# Patient Record
Sex: Male | Born: 1935
Health system: Southern US, Community
[De-identification: ages and names within clinical notes are randomized; demographics above are authoritative.]

## PROBLEM LIST (undated history)

## (undated) DIAGNOSIS — M1909 Primary osteoarthritis, other specified site: Secondary | ICD-10-CM

## (undated) DIAGNOSIS — H903 Sensorineural hearing loss, bilateral: Secondary | ICD-10-CM

## (undated) DIAGNOSIS — R059 Cough, unspecified: Secondary | ICD-10-CM

## (undated) DIAGNOSIS — R05 Cough: Secondary | ICD-10-CM

## (undated) DIAGNOSIS — N400 Enlarged prostate without lower urinary tract symptoms: Secondary | ICD-10-CM

## (undated) DIAGNOSIS — I6789 Other cerebrovascular disease: Secondary | ICD-10-CM

## (undated) DIAGNOSIS — N1832 Chronic kidney disease, stage 3b: Secondary | ICD-10-CM

## (undated) DIAGNOSIS — N529 Male erectile dysfunction, unspecified: Secondary | ICD-10-CM

## (undated) DIAGNOSIS — G8929 Other chronic pain: Secondary | ICD-10-CM

## (undated) DIAGNOSIS — R7309 Other abnormal glucose: Secondary | ICD-10-CM

## (undated) DIAGNOSIS — IMO0002 Reserved for concepts with insufficient information to code with codable children: Secondary | ICD-10-CM

## (undated) DIAGNOSIS — Z8719 Personal history of other diseases of the digestive system: Secondary | ICD-10-CM

## (undated) DIAGNOSIS — R053 Chronic cough: Secondary | ICD-10-CM

## (undated) DIAGNOSIS — E11649 Type 2 diabetes mellitus with hypoglycemia without coma: Secondary | ICD-10-CM

## (undated) DIAGNOSIS — H9190 Unspecified hearing loss, unspecified ear: Secondary | ICD-10-CM

## (undated) DIAGNOSIS — E1169 Type 2 diabetes mellitus with other specified complication: Secondary | ICD-10-CM

## (undated) DIAGNOSIS — M5136 Other intervertebral disc degeneration, lumbar region: Secondary | ICD-10-CM

## (undated) DIAGNOSIS — M1990 Unspecified osteoarthritis, unspecified site: Secondary | ICD-10-CM

## (undated) DIAGNOSIS — M51369 Other intervertebral disc degeneration, lumbar region without mention of lumbar back pain or lower extremity pain: Secondary | ICD-10-CM

## (undated) DIAGNOSIS — H409 Unspecified glaucoma: Secondary | ICD-10-CM

## (undated) DIAGNOSIS — Z8673 Personal history of transient ischemic attack (TIA), and cerebral infarction without residual deficits: Secondary | ICD-10-CM

## (undated) DIAGNOSIS — K269 Duodenal ulcer, unspecified as acute or chronic, without hemorrhage or perforation: Secondary | ICD-10-CM

## (undated) DIAGNOSIS — E1121 Type 2 diabetes mellitus with diabetic nephropathy: Secondary | ICD-10-CM

## (undated) DIAGNOSIS — M7122 Synovial cyst of popliteal space [Baker], left knee: Secondary | ICD-10-CM

## (undated) DIAGNOSIS — D3501 Benign neoplasm of right adrenal gland: Secondary | ICD-10-CM

## (undated) DIAGNOSIS — Z974 Presence of external hearing-aid: Secondary | ICD-10-CM

## (undated) DIAGNOSIS — E785 Hyperlipidemia, unspecified: Secondary | ICD-10-CM

## (undated) DIAGNOSIS — I700 Atherosclerosis of aorta: Secondary | ICD-10-CM

## (undated) DIAGNOSIS — M7120 Synovial cyst of popliteal space [Baker], unspecified knee: Secondary | ICD-10-CM

## (undated) DIAGNOSIS — I10 Essential (primary) hypertension: Secondary | ICD-10-CM

## (undated) DIAGNOSIS — M5134 Other intervertebral disc degeneration, thoracic region: Secondary | ICD-10-CM

## (undated) DIAGNOSIS — I5020 Unspecified systolic (congestive) heart failure: Secondary | ICD-10-CM

## (undated) DIAGNOSIS — M5030 Other cervical disc degeneration, unspecified cervical region: Secondary | ICD-10-CM

## (undated) DIAGNOSIS — J3800 Paralysis of vocal cords and larynx, unspecified: Secondary | ICD-10-CM

## (undated) DIAGNOSIS — I639 Cerebral infarction, unspecified: Secondary | ICD-10-CM

## (undated) HISTORY — DX: Reserved for concepts with insufficient information to code with codable children: IMO0002

## (undated) HISTORY — DX: Other abnormal glucose: R73.09

## (undated) HISTORY — DX: Unspecified glaucoma: H40.9

## (undated) HISTORY — DX: Type 2 diabetes mellitus with other specified complication: E78.5

## (undated) HISTORY — DX: Unspecified osteoarthritis, unspecified site: M19.90

## (undated) HISTORY — PX: HERNIA REPAIR: SHX51

## (undated) HISTORY — DX: Benign prostatic hyperplasia without lower urinary tract symptoms: N40.0

## (undated) HISTORY — DX: Synovial cyst of popliteal space (Baker), left knee: M71.22

## (undated) HISTORY — PX: COCHLEAR IMPLANT: SUR684

## (undated) HISTORY — PX: CARDIAC CATHETERIZATION: SHX172

## (undated) HISTORY — DX: Cerebral infarction, unspecified: I63.9

## (undated) HISTORY — PX: OTHER SURGICAL HISTORY: SHX169

## (undated) HISTORY — DX: Paralysis of vocal cords and larynx, unspecified: J38.00

## (undated) HISTORY — PX: TONSILLECTOMY: SUR1361

## (undated) HISTORY — PX: ADRENALECTOMY: SHX876

## (undated) HISTORY — DX: Personal history of other diseases of the digestive system: Z87.19

## (undated) HISTORY — DX: Male erectile dysfunction, unspecified: N52.9

## (undated) HISTORY — DX: Personal history of transient ischemic attack (TIA), and cerebral infarction without residual deficits: Z86.73

---

## 2005-09-23 ENCOUNTER — Encounter: Payer: Self-pay | Admitting: Family Medicine

## 2006-03-07 HISTORY — PX: SQUAMOUS CELL CARCINOMA EXCISION: SHX2433

## 2006-11-03 ENCOUNTER — Encounter: Payer: Self-pay | Admitting: Family Medicine

## 2006-11-04 LAB — CONVERTED CEMR LAB
HDL: 78 mg/dL
Hgb A1c MFr Bld: 6.2 %
LDL Cholesterol: 109 mg/dL
Triglycerides: 49 mg/dL

## 2007-07-20 ENCOUNTER — Ambulatory Visit: Payer: Self-pay | Admitting: Family Medicine

## 2007-09-07 ENCOUNTER — Ambulatory Visit: Payer: Self-pay | Admitting: Family Medicine

## 2007-09-07 DIAGNOSIS — N400 Enlarged prostate without lower urinary tract symptoms: Secondary | ICD-10-CM | POA: Insufficient documentation

## 2007-09-07 DIAGNOSIS — Z8719 Personal history of other diseases of the digestive system: Secondary | ICD-10-CM

## 2007-09-07 DIAGNOSIS — H409 Unspecified glaucoma: Secondary | ICD-10-CM | POA: Insufficient documentation

## 2007-09-07 DIAGNOSIS — M1990 Unspecified osteoarthritis, unspecified site: Secondary | ICD-10-CM | POA: Insufficient documentation

## 2007-09-07 DIAGNOSIS — Z86018 Personal history of other benign neoplasm: Secondary | ICD-10-CM | POA: Insufficient documentation

## 2007-10-09 ENCOUNTER — Ambulatory Visit: Payer: Self-pay | Admitting: Gastroenterology

## 2007-10-23 ENCOUNTER — Ambulatory Visit: Payer: Self-pay | Admitting: Gastroenterology

## 2007-11-24 ENCOUNTER — Ambulatory Visit: Payer: Self-pay | Admitting: Family Medicine

## 2007-11-24 LAB — CONVERTED CEMR LAB
AST: 20 units/L (ref 0–37)
Alkaline Phosphatase: 45 units/L (ref 39–117)
Bilirubin, Direct: 0.1 mg/dL (ref 0.0–0.3)
Chloride: 105 meq/L (ref 96–112)
GFR calc Af Amer: 76 mL/min
GFR calc non Af Amer: 63 mL/min
Glucose, Bld: 100 mg/dL — ABNORMAL HIGH (ref 70–99)
HDL: 83.3 mg/dL (ref >39.0)
Potassium: 4.4 meq/L (ref 3.5–5.1)
Sodium: 141 meq/L (ref 135–145)
VLDL: 7 mg/dL (ref 0–40)

## 2007-11-25 ENCOUNTER — Encounter: Payer: Self-pay | Admitting: Family Medicine

## 2007-12-12 LAB — CONVERTED CEMR LAB
Catecholamines Tot(E+NE) 24 Hr U: 0.048 mg/24hr
Dopamine 24 Hr Urine: 156 mcg/24hr (ref <500)
Epinephrine 24 Hr Urine: 5 mcg/24hr (ref <20)

## 2007-12-14 ENCOUNTER — Ambulatory Visit: Payer: Self-pay | Admitting: Family Medicine

## 2007-12-15 ENCOUNTER — Ambulatory Visit: Payer: Self-pay | Admitting: Gastroenterology

## 2007-12-15 LAB — HM COLONOSCOPY

## 2008-07-18 ENCOUNTER — Encounter: Payer: Self-pay | Admitting: Family Medicine

## 2008-08-02 ENCOUNTER — Telehealth: Payer: Self-pay | Admitting: Family Medicine

## 2008-09-13 ENCOUNTER — Telehealth: Payer: Self-pay | Admitting: Family Medicine

## 2009-02-09 ENCOUNTER — Encounter: Payer: Self-pay | Admitting: Family Medicine

## 2009-02-13 ENCOUNTER — Encounter: Payer: Self-pay | Admitting: Family Medicine

## 2009-05-09 ENCOUNTER — Telehealth: Payer: Self-pay | Admitting: Family Medicine

## 2009-05-12 ENCOUNTER — Ambulatory Visit: Payer: Self-pay | Admitting: Family Medicine

## 2009-05-12 DIAGNOSIS — E1169 Type 2 diabetes mellitus with other specified complication: Secondary | ICD-10-CM | POA: Insufficient documentation

## 2009-05-12 DIAGNOSIS — E7800 Pure hypercholesterolemia, unspecified: Secondary | ICD-10-CM

## 2009-05-13 ENCOUNTER — Encounter: Payer: Self-pay | Admitting: Family Medicine

## 2009-05-13 LAB — CONVERTED CEMR LAB
Albumin: 3.8 g/dL (ref 3.5–5.2)
Alkaline Phosphatase: 42 units/L (ref 39–117)
Basophils Relative: 0 % (ref 0.0–3.0)
Bilirubin, Direct: 0.1 mg/dL (ref 0.0–0.3)
Calcium: 9 mg/dL (ref 8.4–10.5)
Eosinophils Absolute: 0.1 10*3/uL (ref 0.0–0.7)
GFR calc non Af Amer: 69.29 mL/min (ref >60)
HDL: 78.7 mg/dL (ref >39.00)
Lymphocytes Relative: 23.9 % (ref 12.0–46.0)
MCHC: 33.4 g/dL (ref 30.0–36.0)
Neutrophils Relative %: 63.2 % (ref 43.0–77.0)
RBC: 4.3 M/uL (ref 4.22–5.81)
Sodium: 139 meq/L (ref 135–145)
Total Bilirubin: 1.1 mg/dL (ref 0.3–1.2)
Total CHOL/HDL Ratio: 3
VLDL: 8 mg/dL (ref 0.0–40.0)
WBC: 4.4 10*3/uL — ABNORMAL LOW (ref 4.5–10.5)

## 2009-05-15 ENCOUNTER — Ambulatory Visit: Payer: Self-pay | Admitting: Family Medicine

## 2009-08-05 DEATH — deceased

## 2009-09-01 ENCOUNTER — Telehealth: Payer: Self-pay | Admitting: Family Medicine

## 2010-03-05 ENCOUNTER — Encounter (INDEPENDENT_AMBULATORY_CARE_PROVIDER_SITE_OTHER): Payer: Self-pay | Admitting: *Deleted

## 2010-05-15 ENCOUNTER — Telehealth: Payer: Self-pay | Admitting: Family Medicine

## 2010-06-16 ENCOUNTER — Encounter: Payer: Self-pay | Admitting: Family Medicine

## 2010-07-07 NOTE — Progress Notes (Signed)
Summary: levitra  Phone Note Refill Request Call back at Home Phone [phone] Message from:  Patient on September 01, 2009 11:48 AM  Refills Requested: Medication #1:  LEVITRA 20 MG TABS 1/2 to 1 tab as needed.. [facility] [address]  Initial call taken by: Melody Comas,  September 01, 2009 11:48 AM    Prescriptions: LEVITRA 20 MG TABS (VARDENAFIL HCL) 1/2 to 1 tab as needed.  #10 x 11   Entered and Authorized by:   Hannah Beat MD   Signed by:   Hannah Beat MD on 09/01/2009   Method used:   Electronically to        [id number]       [address]       [place], [place]  [address]       Ph: [phone]       Fax: [phone]   RxID:   [id number]

## 2010-07-07 NOTE — Progress Notes (Signed)
Summary: medication request  Phone Note Refill Request Call back at Home Phone [phone] Message from:  Patient on May 15, 2010 9:47 AM  Refills Requested: Medication #1:  Clotrimazole and Betamethasone Dipropionate 1%/0.05%   Supply Requested: 1 month   Notes: uses [facility] wax [facility] [address]   Method Requested: Electronic Initial call taken by: Benny Lennert CMA (AAMA),  May 15, 2010 9:48 AM    New/Updated Medications: CLOTRIMAZOLE-BETAMETHASONE 1-0.05 % CREA (CLOTRIMAZOLE-BETAMETHASONE) AAA two times a day as needed rash Prescriptions: CLOTRIMAZOLE-BETAMETHASONE 1-0.05 % CREA (CLOTRIMAZOLE-BETAMETHASONE) AAA two times a day as needed rash  #15 gm x 1   Entered and Authorized by:   Kerby Nora MD   Signed by:   Kerby Nora MD on 05/15/2010   Method used:   Electronically to        [id number]       [address]       [place], [place]  [address]       Ph: [phone]       Fax: [phone]   RxID:   [id number]

## 2010-07-07 NOTE — Miscellaneous (Signed)
  Clinical Lists Changes  Observations: Added new observation of FLU VAX: Historical (01/30/2010 11:29)      Immunization History:  Influenza Immunization History:    Influenza:  historical (01/30/2010)

## 2010-07-09 NOTE — Letter (Signed)
Summary: Medical Clearance for Exercise  Medical Clearance for Exercise   Imported By: Maryln Gottron 06/18/2010 15:15:27  _____________________________________________________________________  External Attachment:    Type:   Image     Comment:   External Document

## 2010-08-04 ENCOUNTER — Telehealth (INDEPENDENT_AMBULATORY_CARE_PROVIDER_SITE_OTHER): Payer: Self-pay | Admitting: *Deleted

## 2010-08-06 ENCOUNTER — Encounter (INDEPENDENT_AMBULATORY_CARE_PROVIDER_SITE_OTHER): Payer: Self-pay | Admitting: *Deleted

## 2010-08-06 ENCOUNTER — Other Ambulatory Visit (INDEPENDENT_AMBULATORY_CARE_PROVIDER_SITE_OTHER): Payer: Medicare Other

## 2010-08-06 ENCOUNTER — Other Ambulatory Visit: Payer: Self-pay | Admitting: Family Medicine

## 2010-08-06 DIAGNOSIS — E785 Hyperlipidemia, unspecified: Secondary | ICD-10-CM

## 2010-08-06 DIAGNOSIS — Z125 Encounter for screening for malignant neoplasm of prostate: Secondary | ICD-10-CM

## 2010-08-06 DIAGNOSIS — E7800 Pure hypercholesterolemia, unspecified: Secondary | ICD-10-CM

## 2010-08-06 LAB — BASIC METABOLIC PANEL
Chloride: 102 mEq/L (ref 96–112)
GFR: 72.88 mL/min (ref >60.00)
Potassium: 4.3 mEq/L (ref 3.5–5.1)
Sodium: 138 mEq/L (ref 135–145)

## 2010-08-06 LAB — PSA: PSA: 0.49 ng/mL (ref 0.10–4.00)

## 2010-08-06 LAB — LIPID PANEL
Total CHOL/HDL Ratio: 3
VLDL: 8.4 mg/dL (ref 0.0–40.0)

## 2010-08-06 LAB — HEPATIC FUNCTION PANEL
ALT: 25 U/L (ref 0–53)
AST: 24 U/L (ref 0–37)
Alkaline Phosphatase: 51 U/L (ref 39–117)
Total Bilirubin: 1.2 mg/dL (ref 0.3–1.2)

## 2010-08-06 LAB — LDL CHOLESTEROL, DIRECT: Direct LDL: 121.8 mg/dL

## 2010-08-10 ENCOUNTER — Other Ambulatory Visit: Payer: Self-pay

## 2010-08-13 NOTE — Progress Notes (Signed)
----   Converted from flag ---- ---- 08/03/2010 10:24 AM, Kerby Nora MD wrote: CMET, lipids Dx 272.0, PSA V76.44  ---- 08/03/2010 10:20 AM, Liane Comber CMA (AAMA) wrote: Lab orders please! Good Morning! This pt is scheduled for cpx labs Reagan, which labs to draw and dx codes to use? Thanks Tasha ------------------------------

## 2010-08-14 ENCOUNTER — Encounter: Payer: Self-pay | Admitting: Family Medicine

## 2010-08-14 ENCOUNTER — Encounter (INDEPENDENT_AMBULATORY_CARE_PROVIDER_SITE_OTHER): Payer: Medicare Other | Admitting: Family Medicine

## 2010-08-14 DIAGNOSIS — E119 Type 2 diabetes mellitus without complications: Secondary | ICD-10-CM | POA: Insufficient documentation

## 2010-08-14 DIAGNOSIS — E1159 Type 2 diabetes mellitus with other circulatory complications: Secondary | ICD-10-CM | POA: Insufficient documentation

## 2010-08-14 DIAGNOSIS — Z0000 Encounter for general adult medical examination without abnormal findings: Secondary | ICD-10-CM

## 2010-08-14 DIAGNOSIS — E7800 Pure hypercholesterolemia, unspecified: Secondary | ICD-10-CM

## 2010-08-14 DIAGNOSIS — N400 Enlarged prostate without lower urinary tract symptoms: Secondary | ICD-10-CM

## 2010-08-14 LAB — CONVERTED CEMR LAB: Cholesterol, target level: 200 mg/dL

## 2010-08-18 NOTE — Assessment & Plan Note (Signed)
Summary: CPX/alc   Vital Signs:  Patient profile:   77 year old male Height:      72 inches Weight:      194.50 pounds BMI:     26.47 Temp:     97.9 degrees F oral Pulse rate:   76 / minute Pulse rhythm:   regular BP sitting:   120 / 74  (left arm) Cuff size:   large  Vitals Entered By: Benny Lennert CMA Duncan Dull) (August 14, 2010 3:27 PM) CC: Lipid Management  Vision Screening:      Vision Comments: Patient wears glasses  Vision Entered By: Benny Lennert CMA Duncan Dull) (August 14, 2010 3:28 PM) 40db HL: Left  Right  Audiometry Comment: Patient has hearing aids    History of Present Illness: Chief complaint annual wellness visit  I have personally reviewed the Medicare Annual Wellness questionnaire and have noted 1.   The patient's medical and social history 2.   Their use of alcohol, tobacco or illicit drugs 3.   Their current medications and supplements 4.   The patient's functional ability including ADL's, fall risks, home safety risks and hearing or visual             impairment. 5.   Diet and physical activities 6.   Evidence for depression or mood disorders The patients weight, height, BMI and visual acuity have been recorded in the chart I have made referrals, counseling and provided education to the patient based review of the above and I have provided the pt with a written personalized care plan for preventive services.   He has the following chronic issues:   High cholesterol.. LDL at goal on fish oil.  Prediabetes: slightly worsened from last check.  Gettiing regular exercsie and eating healthy.    BPH flomax and finasteride.    Lipid Management History:      Positive NCEP/ATP III risk factors include male age 36 years old or older.  Negative NCEP/ATP III risk factors include HDL cholesterol greater than 60 and non-tobacco-user status.     Preventive Screening-Counseling & Management  Alcohol-Tobacco     Alcohol drinks/day: 1     Alcohol  Counseling: not indicated; use of alcohol is not excessive or problematic     Smoking Status: never  Caffeine-Diet-Exercise     Diet Comments: moderate     Does Patient Exercise: yes     Type of exercise: treadmill     Times/week: 3     Exercise Counseling: not indicated; exercise is adequate  Problems Prior to Update: 1)  Onychomycosis  (ICD-110.1) 2)  Other Malaise and Fatigue  (ICD-780.79) 3)  Hypercholesterolemia  (ICD-272.0) 4)  Neoplasm, Skin, Uncertain Behavior  (ICD-238.2) 5)  Special Screening Malignant Neoplasm of Prostate  (ICD-V76.44) 6)  Screening For Lipoid Disorders  (ICD-V77.91) 7)  Bursitis, Elbow  (ICD-726.33) 8)  Hematochezia  (ICD-578.1) 9)  Benign Prostatic Hypertrophy  (ICD-600.00) 10)  Glaucoma  (ICD-365.9) 11)  Duodenal Ulcer, Acute, Hemorrhage  (ICD-532.00) 12)  Pheochromocytoma  (ICD-227.0) 13)  Osteoarthritis  (ICD-715.90)  Current Medications (verified): 1)  Flomax 0.4 Mg  Cp24 (Tamsulosin Hcl) .... Take 1 Tablet By Mouth Once A Day Generic Only 2)  Finasteride 5 Mg  Tabs (Finasteride) .... Take 1 Tablet By Mouth Once A Day 3)  Lumigan 0.03 %  Soln (Bimatoprost) .... Once Daily 4)  Aspirin 81 Mg  Tabs (Aspirin) .... Take 1 Tablet By Mouth Once A Day 5)  Fish Oil  Oil (Fish Oil) .... Take 1 Capsule By Mouth Once A Day 6)  Docusate Sodium 100 Mg  Caps (Docusate Sodium) .... Take 1 Tablet By Mouth Once A Day 7)  Multivitamins   Tabs (Multiple Vitamin) .... Take 1 Tablet By Mouth Once A Day 8)  Terbinafine Hcl 250 Mg Tabs (Terbinafine Hcl) .Marland Kitchen.. 1 Tab By Mouth Daily X 3 Months 9)  Levitra 20 Mg Tabs (Vardenafil Hcl) .... 1/2 To 1 Tab As Needed.  Allergies (verified): No Known Drug Allergies  Past History:  Past medical, surgical, family and social histories (including risk factors) reviewed, and no changes noted (except as noted below).  Past Medical History: Reviewed history from 10/09/2007 and no changes required. Osteoarthritis Diabetes  mellitus, type II Benign prostatic hypertrophy Bleeding duodenal ulcer in association with his pheochromocytoma  Past Surgical History: Reviewed history from 09/07/2007 and no changes required. slipped disc in lumbar spine right adrenalectomy for pheochromocytoma 2006 Tonsillectomy Right inguinal hernia repair 10/11/05 squamous cell CA left ear 10/07  Family History: Reviewed history from 12/14/2007 and no changes required. father: brain tumor mother: alcoholic, CAD brother: DM no cancer  Social History: Reviewed history from 09/07/2007 and no changes required. Retired Orthoptist Married Never Smoked Alcohol use-yes, 2 glasses of wine daily Drug use-no Regular exercise-yes 3-4 x a week treadmill/walk Diet:  fruits, veggies, water  Review of Systems General:  Denies fatigue and fever. CV:  Denies chest pain or discomfort. Resp:  Denies shortness of breath, sputum productive, and wheezing. GI:  Denies abdominal pain and bloody stools. GU:  Denies dysuria, urinary frequency, and urinary hesitancy; ED improved with levitra.. Psych:  Denies anxiety and depression.  Physical Exam  General:  Well-developed,well-nourished,in no acute distress; alert,appropriate and cooperative throughout examination Eyes:  No corneal or conjunctival inflammation noted. EOMI. Perrla. Funduscopic exam benign, without hemorrhages, exudates or papilledema. Vision grossly normal. Ears:  External ear exam shows no significant lesions or deformities.  Otoscopic examination reveals clear canals, tympanic membranes are intact bilaterally without bulging, retraction, inflammation or discharge. Hearing is grossly normal bilaterally. Nose:  External nasal examination shows no deformity or inflammation. Nasal mucosa are pink and moist without lesions or exudates. Mouth:  Oral mucosa and oropharynx without lesions or exudates.  Teeth in good repair. Neck:  no carotid bruit or thyromegaly  no cervical or  supraclavicular lymphadenopathy  Lungs:  Normal respiratory effort, chest expands symmetrically. Lungs are clear to auscultation, no crackles or wheezes. Heart:  Normal rate and regular rhythm. S1 and S2 normal without gallop, murmur, click, rub or other extra sounds. Abdomen:  Bowel sounds positive,abdomen soft and non-tender without masses, organomegaly or hernias noted. Rectal:  No external abnormalities noted. Normal sphincter tone. No rectal masses or tenderness. Genitalia:  Testes bilaterally descended without nodularity, tenderness or masses. No scrotal masses or lesions. No penis lesions or urethral discharge. Prostate:  Prostate gland firm and smooth, mild  enlargement, nodularity, tenderness, mass, asymmetry or induration. Msk:  No deformity or scoliosis noted of thoracic or lumbar spine.   Pulses:  R and L posterior tibial pulses are full and equal bilaterally  Extremities:  no edema Neurologic:  No cranial nerve deficits noted. Station and gait are normal. Plantar reflexes are down-going bilaterally. DTRs are symmetrical throughout. Sensory, motor and coordinative functions appear intact. Skin:  Intact without suspicious lesions or rashes surgical site onleft lateral cheek Psych:  Cognition and judgment appear intact. Alert and cooperative with normal attention span and concentration. No apparent delusions, illusions,  hallucinations   Impression & Recommendations:  Problem # 1:  Preventive Health Care (ICD-V70.0)  The patient's preventative maintenance and recommended screening tests for an annual wellness exam were reviewed in full today. Brought up to date unless services declined.  Counselled on the importance of diet, exercise, and its role in overall health and mortality. The patient's FH and SH was reviewed, including their home life, tobacco status, and drug and alcohol status.     Orders: Medicare -1st Annual Wellness Visit [phone])  Problem # 2:  BENIGN PROSTATIC  HYPERTROPHY (ICD-600.00) stable symptoms.  Orders: Prescription Created Electronically [id number])  Problem # 3:  HYPERCHOLESTEROLEMIA (ICD-272.0) Stable control. Encouraged exercise, weight loss, healthy eating habits.   Problem # 4:  PREDIABETES (ICD-790.29) Counsled on low carb diet.Marland Kitchen decrease fruit, OJ, etc.  Complete Medication List: 1)  Flomax 0.4 Mg Cp24 (Tamsulosin hcl) .... Take 1 tablet by mouth once a day generic only 2)  Finasteride 5 Mg Tabs (Finasteride) .... Take 1 tablet by mouth once a day 3)  Lumigan 0.03 % Soln (Bimatoprost) .... Once daily 4)  Aspirin 81 Mg Tabs (Aspirin) .... Take 1 tablet by mouth once a day 5)  Fish Oil Oil (Fish oil) .... Take 1 capsule by mouth once a day 6)  Docusate Sodium 100 Mg Caps (Docusate sodium) .... Take 1 tablet by mouth once a day 7)  Multivitamins Tabs (Multiple vitamin) .... Take 1 tablet by mouth once a day 8)  Terbinafine Hcl 250 Mg Tabs (Terbinafine hcl) .Marland Kitchen.. 1 tab by mouth daily x 3 months 9)  Levitra 20 Mg Tabs (Vardenafil hcl) .... 1/2 to 1 tab as needed.  Lipid Assessment/Plan:      Based on NCEP/ATP III, the patient's risk factor category is "0-1 risk factors".  The patient's lipid goals are as follows: Total cholesterol goal is 200; LDL cholesterol goal is 160; HDL cholesterol goal is 40; Triglyceride goal is 150.  His LDL cholesterol goal has been met.    Patient Instructions: 1)  We have reviewed your personalized plan for preventive services.  2)  Decrease OJ in diet. 3)  Please schedule a follow-up appointment in 1 year for CPX.  Prescriptions: FLOMAX 0.4 MG  CP24 (TAMSULOSIN HCL) Take 1 tablet by mouth once a day GENERIC ONLY  #90 x 3   Entered and Authorized by:   Kerby Nora MD   Signed by:   Kerby Nora MD on 08/14/2010   Method used:   [facility]        [id number]       [address]       [place], [place]  [address]       Ph: [phone]       Fax: [phone]   RxID:    [id number]    Orders Added: 1)  Prescription Created Electronically [phone] 2)  Medicare -1st Annual Wellness Visit [G0438]    Current Allergies (reviewed today): No known allergies

## 2010-08-25 NOTE — Letter (Signed)
Summary: Nature conservation officer [employer] Wellness Visit Questionnaire   [employer] Medicare Annual Wellness Visit Questionnaire   Imported By: Beau Fanny 08/17/2010 11:25:00  _____________________________________________________________________  External Attachment:    Type:   Image     Comment:   External Document

## 2010-12-02 ENCOUNTER — Other Ambulatory Visit: Payer: Self-pay | Admitting: Family Medicine

## 2011-01-14 ENCOUNTER — Other Ambulatory Visit: Payer: Self-pay | Admitting: *Deleted

## 2011-01-14 MED ORDER — VARDENAFIL HCL 20 MG PO TABS: ORAL_TABLET | ORAL | Status: DC

## 2011-01-14 NOTE — Telephone Encounter (Signed)
Ok to refill #10, 0 refills

## 2011-03-09 ENCOUNTER — Other Ambulatory Visit: Payer: Self-pay | Admitting: Family Medicine

## 2011-06-02 ENCOUNTER — Encounter: Payer: Self-pay | Admitting: Family Medicine

## 2011-06-03 ENCOUNTER — Encounter: Payer: Self-pay | Admitting: Family Medicine

## 2011-06-03 ENCOUNTER — Ambulatory Visit (INDEPENDENT_AMBULATORY_CARE_PROVIDER_SITE_OTHER): Payer: Medicare Other | Admitting: Family Medicine

## 2011-06-03 DIAGNOSIS — Z0289 Encounter for other administrative examinations: Secondary | ICD-10-CM

## 2011-06-03 NOTE — Assessment & Plan Note (Signed)
MMSE 29/30. Normal neurologic and psych exam. Pt is competent to make decisions to care for himself, no issues of depression or anxiety. Letter written and given to pt.

## 2011-06-03 NOTE — Progress Notes (Signed)
  Subjective:    Patient ID: Dustin Harrison, male    DOB: 04/22/1932, 77 y.o.   MRN: [id number]  HPI  77 year old male with history of BPH, pheochromocytoma and prediabetes presents with request for letter of competency.  Incident occurred on 12/1. Dustin Harrison reports that he did not heed advice of a police officer to go around traffic from a light show.  He was upset that he had to go around which would take an hour when he lived 75 feet away at [facility]. He states that charges have been brought against him by the police officer for assault with a vehicle, etc.  He is asking today for a letter if I deem him competent to make decisions and able to care for himself.  Of note I have never had any concern about his compentency in past or any concern about mood issues. No recent falls. No personal concerns about memory. Wife has no concerns per pt report.  Able to perform all his ADLs.  Able to feed, cloth. 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 Exercising regularly.Marland Kitchen Spends 1 hour in pool 3 days a week.          Review of Systems  Constitutional: Negative for fever and fatigue.  HENT: Negative for congestion.   Respiratory: Negative for shortness of breath and wheezing.   Cardiovascular: Negative for chest pain, palpitations and leg swelling.  Gastrointestinal: Negative for abdominal pain.  Psychiatric/Behavioral: Negative for suicidal ideas, hallucinations, behavioral problems, confusion, sleep disturbance, self-injury, dysphoric mood, decreased concentration and agitation. The patient is not nervous/anxious.        Objective:   Physical Exam  Constitutional: He is oriented to person, place, and time. He appears well-developed and well-nourished.  HENT:  Head: Normocephalic.  Eyes: Conjunctivae are normal. Pupils are equal, round, and reactive to light.  Cardiovascular: Normal rate, regular rhythm, normal heart sounds and intact distal pulses.  Exam reveals no gallop and no  friction rub.   No murmur heard. Pulmonary/Chest: Effort normal and breath sounds normal. No respiratory distress. He has no wheezes. He has no rales. He exhibits no tenderness.  Neurological: He is alert and oriented to person, place, and time. He has normal strength. No cranial nerve deficit or sensory deficit. Gait normal.  Psychiatric: He has a normal mood and affect. His behavior is normal. Judgment and thought content normal. His mood appears not anxious. His affect is not angry, not labile and not inappropriate. He is not aggressive, not slowed, not withdrawn and not combative. Cognition and memory are not impaired. He does not express impulsivity or inappropriate judgment. He does not exhibit a depressed mood.     MMSE performed in office today 29/30.     Assessment & Plan:

## 2011-06-03 NOTE — Patient Instructions (Signed)
Keep appt for physical in 10/2011.

## 2011-06-20 ENCOUNTER — Other Ambulatory Visit: Payer: Self-pay | Admitting: Family Medicine

## 2011-07-18 ENCOUNTER — Other Ambulatory Visit: Payer: Self-pay | Admitting: Family Medicine

## 2011-08-06 ENCOUNTER — Other Ambulatory Visit: Payer: BC Managed Care – PPO

## 2011-08-10 ENCOUNTER — Encounter: Payer: BC Managed Care – PPO | Admitting: Family Medicine

## 2011-08-12 ENCOUNTER — Encounter: Payer: BC Managed Care – PPO | Admitting: Family Medicine

## 2011-08-27 ENCOUNTER — Other Ambulatory Visit: Payer: Self-pay | Admitting: Family Medicine

## 2011-09-16 ENCOUNTER — Encounter: Payer: BC Managed Care – PPO | Admitting: Family Medicine

## 2011-09-29 ENCOUNTER — Ambulatory Visit: Payer: Self-pay | Admitting: Ophthalmology

## 2011-10-08 ENCOUNTER — Other Ambulatory Visit: Payer: Self-pay | Admitting: Family Medicine

## 2011-10-11 ENCOUNTER — Other Ambulatory Visit: Payer: BC Managed Care – PPO

## 2011-10-14 ENCOUNTER — Encounter: Payer: BC Managed Care – PPO | Admitting: Family Medicine

## 2011-10-26 ENCOUNTER — Encounter: Payer: BC Managed Care – PPO | Admitting: Family Medicine

## 2011-11-05 ENCOUNTER — Telehealth: Payer: Self-pay | Admitting: Family Medicine

## 2011-11-05 DIAGNOSIS — E7800 Pure hypercholesterolemia, unspecified: Secondary | ICD-10-CM

## 2011-11-05 NOTE — Telephone Encounter (Signed)
Message copied by Excell Seltzer on Fri Nov 05, 2011  9:49 PM ------      Message from: Alvina Chou      Created: Thu Nov 04, 2011 12:29 PM      Regarding: Lab orders for [facility] June 3       Patient is scheduled for CPX labs, please order future labs, Thanks , Camelia Eng

## 2011-11-08 ENCOUNTER — Other Ambulatory Visit (INDEPENDENT_AMBULATORY_CARE_PROVIDER_SITE_OTHER): Payer: Medicare Other

## 2011-11-08 DIAGNOSIS — E7800 Pure hypercholesterolemia, unspecified: Secondary | ICD-10-CM

## 2011-11-08 LAB — LDL CHOLESTEROL, DIRECT: Direct LDL: 115.5 mg/dL

## 2011-11-08 LAB — COMPREHENSIVE METABOLIC PANEL
ALT: 22 U/L (ref 0–53)
AST: 22 U/L (ref 0–37)
Alkaline Phosphatase: 56 U/L (ref 39–117)
Creatinine, Ser: 1.1 mg/dL (ref 0.4–1.5)
GFR: 69.57 mL/min (ref >60.00)
Sodium: 139 mEq/L (ref 135–145)
Total Bilirubin: 1.1 mg/dL (ref 0.3–1.2)
Total Protein: 6.7 g/dL (ref 6.0–8.3)

## 2011-11-08 LAB — LIPID PANEL
Cholesterol: 226 mg/dL — ABNORMAL HIGH (ref 0–200)
Total CHOL/HDL Ratio: 2

## 2011-11-09 ENCOUNTER — Other Ambulatory Visit: Payer: BC Managed Care – PPO

## 2011-11-12 ENCOUNTER — Ambulatory Visit (INDEPENDENT_AMBULATORY_CARE_PROVIDER_SITE_OTHER): Payer: Medicare Other | Admitting: Family Medicine

## 2011-11-12 ENCOUNTER — Encounter: Payer: Self-pay | Admitting: Family Medicine

## 2011-11-12 VITALS — BP 120/78 | HR 64 | Temp 98.9°F | Ht 73.0 in | Wt 194.5 lb

## 2011-11-12 DIAGNOSIS — Z0000 Encounter for general adult medical examination without abnormal findings: Secondary | ICD-10-CM

## 2011-11-12 DIAGNOSIS — E7800 Pure hypercholesterolemia, unspecified: Secondary | ICD-10-CM

## 2011-11-12 DIAGNOSIS — L84 Corns and callosities: Secondary | ICD-10-CM

## 2011-11-12 NOTE — Assessment & Plan Note (Signed)
Info given. Treat with medicated pad. Referral to podiatry if not improving.

## 2011-11-12 NOTE — Patient Instructions (Addendum)
Work on low [employer]. Avoid desserts. Cut back on cheese. Stop daily bannana. Follow up in 12 months fasting labs prior.

## 2011-11-12 NOTE — Progress Notes (Signed)
Subjective:    Patient ID: Dustin Harrison, male    DOB: 1931-08-24, 78 y.o.   MRN: [id number]  HPI I have personally reviewed the Medicare Annual Wellness questionnaire and have noted 1. The patient's medical and social history 2. Their use of alcohol, tobacco or illicit drugs 3. Their current medications and supplements 4. The patient's functional ability including ADL's, fall risks, home safety risks and hearing or visual             impairment. 5. Diet and physical activities 6. Evidence for depression or mood disorders The patients weight, height, BMI and visual acuity have been recorded in the chart I have made referrals, counseling and provided education to the patient based review of the above and I have provided the pt with a written personalized care plan for preventive services.  Elevated Cholesterol: LDL at goal <130 on no med Lab Results  Component Value Date   CHOL 226* 11/08/2011   HDL 97.60 11/08/2011   LDLCALC 109 11/04/2006   LDLDIRECT 115.5 11/08/2011   TRIG 50.0 11/08/2011   CHOLHDL 2 11/08/2011   Diet compliance: Good but eating a lot of cheese. Exercise: [employer] 3 times a week. Other complaints:  BPH: on finasteride and flomax, gets up only 1-2 times a night. Review of Systems  Constitutional: Negative for fever, fatigue and unexpected weight change.  HENT: Negative for ear pain, congestion, sore throat, rhinorrhea, trouble swallowing and postnasal drip.   Eyes: Negative for pain.  Respiratory: Negative for cough, shortness of breath and wheezing.   Cardiovascular: Negative for chest pain, palpitations and leg swelling.  Gastrointestinal: Negative for nausea, abdominal pain, diarrhea, constipation and blood in stool.  Genitourinary: Negative for dysuria, urgency, hematuria, discharge, penile swelling, scrotal swelling, difficulty urinating, penile pain and testicular pain.  Skin: Negative for rash.  Neurological: Negative for syncope, weakness,  light-headedness, numbness and headaches.  Psychiatric/Behavioral: Negative for behavioral problems and dysphoric mood. The patient is not nervous/anxious.        Objective:   Physical Exam  Constitutional: He appears well-developed and well-nourished.  Non-toxic appearance. He does not appear ill. No distress.  HENT:  Head: Normocephalic and atraumatic.  Right Ear: Hearing, tympanic membrane, external ear and ear canal normal.  Left Ear: Hearing, tympanic membrane, external ear and ear canal normal.  Nose: Nose normal.  Mouth/Throat: Uvula is midline, oropharynx is clear and moist and mucous membranes are normal.  Eyes: Conjunctivae, EOM and lids are normal. Pupils are equal, round, and reactive to light. No foreign bodies found.  Neck: Trachea normal, normal range of motion and phonation normal. Neck supple. Carotid bruit is not present. No mass and no thyromegaly present.  Cardiovascular: Normal rate, regular rhythm, S1 normal, S2 normal, intact distal pulses and normal pulses.  Exam reveals no gallop.   No murmur heard. Pulmonary/Chest: Breath sounds normal. He has no wheezes. He has no rhonchi. He has no rales.  Abdominal: Soft. Normal appearance and bowel sounds are normal. There is no hepatosplenomegaly. There is no tenderness. There is no rebound, no guarding and no CVA tenderness. No hernia. Hernia confirmed negative in the right inguinal area and confirmed negative in the left inguinal area.  Genitourinary: Prostate normal, testes normal and penis normal. Rectal exam shows no external hemorrhoid, no internal hemorrhoid, no fissure, no mass, no tenderness and anal tone normal. Guaiac negative stool. Prostate is not enlarged and not tender. Right testis shows no mass and no tenderness. Left testis shows no  mass and no tenderness. No paraphimosis or penile tenderness.  Lymphadenopathy:    He has no cervical adenopathy.       Right: No inguinal adenopathy present.       Left: No  inguinal adenopathy present.  Neurological: He is alert. He has normal strength and normal reflexes. No cranial nerve deficit or sensory deficit. Gait normal.  Skin: Skin is warm, dry and intact. No rash noted.       Corn on 3rd digit right foot  Psychiatric: He has a normal mood and affect. His speech is normal and behavior is normal. Judgment normal.          Assessment & Plan:  The patient's preventative maintenance and recommended screening tests for an annual wellness exam were reviewed in full today. Brought up to date unless services declined.  Counselled on the importance of diet, exercise, and its role in overall health and mortality. The patient's FH and SH was reviewed, including their home life, tobacco status, and drug and alcohol status.   Colon:2009 Dr. Arlyce Dice, repeat in 10 years.  prostate: Plan PSA every other year, rectal exam yearly Lab Results  Component Value Date   PSA 0.49 08/06/2010   PSA 0.43 11/24/2007   PSA 0.44 11/04/2006  Nonsmoker Vaccine:Uptodate

## 2011-11-12 NOTE — Assessment & Plan Note (Signed)
Well controled on no med.

## 2012-01-18 ENCOUNTER — Other Ambulatory Visit: Payer: Self-pay

## 2012-01-18 NOTE — Telephone Encounter (Signed)
Pt request Levitra to [facility].Advised pt [facility] can request refills [facility]. Pt will contact [facility] to get refills.

## 2012-03-07 ENCOUNTER — Other Ambulatory Visit: Payer: Self-pay | Admitting: Family Medicine

## 2012-03-07 NOTE — Telephone Encounter (Signed)
Electronic refill request.  Last written 07/18/11 with 1 RF.

## 2012-08-22 ENCOUNTER — Encounter: Payer: Self-pay | Admitting: Family Medicine

## 2012-08-22 ENCOUNTER — Ambulatory Visit (INDEPENDENT_AMBULATORY_CARE_PROVIDER_SITE_OTHER): Payer: Medicare PPO | Admitting: Family Medicine

## 2012-08-22 ENCOUNTER — Ambulatory Visit: Payer: Medicare Other | Admitting: Family Medicine

## 2012-08-22 VITALS — BP 150/90 | HR 70 | Temp 98.3°F | Ht 73.0 in | Wt 200.2 lb

## 2012-08-22 DIAGNOSIS — I152 Hypertension secondary to endocrine disorders: Secondary | ICD-10-CM | POA: Insufficient documentation

## 2012-08-22 DIAGNOSIS — I10 Essential (primary) hypertension: Secondary | ICD-10-CM | POA: Insufficient documentation

## 2012-08-22 DIAGNOSIS — N400 Enlarged prostate without lower urinary tract symptoms: Secondary | ICD-10-CM

## 2012-08-22 DIAGNOSIS — E1159 Type 2 diabetes mellitus with other circulatory complications: Secondary | ICD-10-CM | POA: Insufficient documentation

## 2012-08-22 DIAGNOSIS — R519 Headache, unspecified: Secondary | ICD-10-CM | POA: Insufficient documentation

## 2012-08-22 DIAGNOSIS — R51 Headache: Secondary | ICD-10-CM

## 2012-08-22 MED ORDER — LOSARTAN POTASSIUM 25 MG PO TABS: 25.0000 mg | ORAL_TABLET | Freq: Every day | ORAL | Status: DC

## 2012-08-22 NOTE — Patient Instructions (Addendum)
Start losartan for blood pressure daily. Okay to take finasteride every other day. Follow BP at home.  Return in 7-10 days for lab work.  Call sooner if headache returns or if BP not < goal 140/90. Work on [employer], exercise and weight loss.  Keep appt for CPX in 11/2012.

## 2012-08-22 NOTE — Assessment & Plan Note (Signed)
No fall, no injury. nml neuro exam.  Most likely due to elevated BP.  Will eval with sed rate to rule out temporal arteritis but no symptoms of PMR.

## 2012-08-22 NOTE — Assessment & Plan Note (Signed)
Discussed his concerns about prostate cancer diagnosis. He will contiue to take this every other day.

## 2012-08-22 NOTE — Assessment & Plan Note (Signed)
New onset, decrease in finesteride may have caused this too appear as well as 10 lb weight gain in last year.  Start losartan daily low dose.  If able to make lifestyle changes he wishes to try to stop this med at follow up.

## 2012-08-22 NOTE — Progress Notes (Signed)
  Subjective:    Patient ID: Dustin Harrison, male    DOB: 07-25-1931, 79 y.o.   MRN: [id number]  HPI 79 year old male with history of pheochromocytoma, prediabetes or high cholesterol  presents with new onset severe left forehead and temporal pain. Radiated to left cheek.  After 15 min of rest pain resolved. Associated with lightheadedness. No CP, no SOB.  No nausea, no photophonophobia. No blurred vision, no speech changes, no weakness, no tingling.  No palpitations.  Was able to walk. No confusion associated.  Prior to episode no proceeding symptoms, no increase in stress. No falls, no head injuries.  Saw [facility]... nml neuro exam.  No stiffness in shoulders and hips.  No history of migraines.    No further recurrence.   BPs have been elevated since 159-175/ 68-90, HR 54-70. Has not been checking at home. He has been taking finasteride every other day in last 3 months because he read article on prostate cancer issues. No change in UOP, nocturia, urine flow on lower dose.,  Wt Readings from Last 3 Encounters:  08/22/12 200 lb 4 oz (90.833 kg)  11/12/11 194 lb 8 oz (88.225 kg)  06/03/11 193 lb 12.8 oz (87.907 kg)   \    Review of Systems  Constitutional: Negative for fever and fatigue.  HENT: Negative for ear pain and postnasal drip.   Eyes: Positive for pain. Negative for photophobia and visual disturbance.  Respiratory: Negative for shortness of breath and wheezing.   Cardiovascular: Negative for chest pain, palpitations and leg swelling.  Gastrointestinal: Negative for abdominal pain.  Neurological: Negative for syncope.       Objective:   Physical Exam  Constitutional: He is oriented to person, place, and time. Vital signs are normal. He appears well-developed and well-nourished.  HENT:  Head: Normocephalic.  Right Ear: Hearing normal.  Left Ear: Hearing normal.  Nose: Nose normal.  Mouth/Throat: Oropharynx is clear and moist and mucous membranes are  normal.  Neck: Trachea normal. Carotid bruit is not present. No mass and no thyromegaly present.  Cardiovascular: Normal rate, regular rhythm and normal pulses.  Exam reveals no gallop, no distant heart sounds and no friction rub.   No murmur heard. No peripheral edema  Pulmonary/Chest: Effort normal and breath sounds normal. No respiratory distress.  Neurological: He is alert and oriented to person, place, and time. He has normal strength and normal reflexes. No sensory deficit. He displays a negative Romberg sign. Gait normal.  Skin: Skin is warm, dry and intact. No rash noted.  Psychiatric: He has a normal mood and affect. His speech is normal and behavior is normal. Thought content normal.          Assessment & Plan:

## 2012-08-30 ENCOUNTER — Telehealth: Payer: Self-pay

## 2012-08-30 NOTE — Telephone Encounter (Signed)
Pt said has been taking Losartan 25 mg daily; pt's BP averaging 150/80 and pt wants to know if needs to increase Losartan. Pt has no h/a,dizziness,CP or SOB. [facility].Please advise.

## 2012-08-31 MED ORDER — LOSARTAN POTASSIUM 50 MG PO TABS: 50.0000 mg | ORAL_TABLET | Freq: Every day | ORAL | Status: DC

## 2012-08-31 NOTE — Telephone Encounter (Signed)
Increase losartan to 50 mg daily. Return in 7-10 days for kidney check BMET Dx HTN ,please schedule and order.

## 2012-08-31 NOTE — Telephone Encounter (Signed)
Patient advised and appt made

## 2012-09-02 ENCOUNTER — Other Ambulatory Visit: Payer: Self-pay | Admitting: Family Medicine

## 2012-09-04 ENCOUNTER — Other Ambulatory Visit: Payer: Medicare PPO

## 2012-09-12 ENCOUNTER — Other Ambulatory Visit (INDEPENDENT_AMBULATORY_CARE_PROVIDER_SITE_OTHER): Payer: Medicare PPO

## 2012-09-12 DIAGNOSIS — I10 Essential (primary) hypertension: Secondary | ICD-10-CM

## 2012-09-12 DIAGNOSIS — R51 Headache: Secondary | ICD-10-CM

## 2012-09-12 LAB — BASIC METABOLIC PANEL
GFR: 60.95 mL/min (ref >60.00)
Potassium: 4.9 mEq/L (ref 3.5–5.1)
Sodium: 139 mEq/L (ref 135–145)

## 2012-09-12 NOTE — Telephone Encounter (Signed)
Pt ate breakfast this AM and wanted to know if could come for kidney function lab test this morning; advised OK to keep appt.

## 2012-09-13 ENCOUNTER — Encounter: Payer: Self-pay | Admitting: *Deleted

## 2012-10-25 ENCOUNTER — Other Ambulatory Visit: Payer: Self-pay | Admitting: Family Medicine

## 2013-01-02 ENCOUNTER — Telehealth: Payer: Self-pay | Admitting: Family Medicine

## 2013-01-02 DIAGNOSIS — E7800 Pure hypercholesterolemia, unspecified: Secondary | ICD-10-CM

## 2013-01-02 DIAGNOSIS — R7309 Other abnormal glucose: Secondary | ICD-10-CM

## 2013-01-02 NOTE — Telephone Encounter (Signed)
Message copied by Excell Seltzer on Tue Jan 02, 2013  2:12 PM ------      Message from: Baldomero Lamy      Created: Tue Dec 26, 2012 10:59 AM      Regarding: Cpx labs Thurs 7/31       Please order  future cpx labs for pt's upcoming lab appt.      Thanks      Tasha       ------

## 2013-01-04 ENCOUNTER — Other Ambulatory Visit (INDEPENDENT_AMBULATORY_CARE_PROVIDER_SITE_OTHER): Payer: Medicare PPO

## 2013-01-04 DIAGNOSIS — E7800 Pure hypercholesterolemia, unspecified: Secondary | ICD-10-CM

## 2013-01-04 DIAGNOSIS — R7309 Other abnormal glucose: Secondary | ICD-10-CM

## 2013-01-04 DIAGNOSIS — I10 Essential (primary) hypertension: Secondary | ICD-10-CM

## 2013-01-04 LAB — COMPREHENSIVE METABOLIC PANEL
ALT: 27 U/L (ref 0–53)
Albumin: 4.2 g/dL (ref 3.5–5.2)
CO2: 30 mEq/L (ref 19–32)
Calcium: 9.2 mg/dL (ref 8.4–10.5)
Chloride: 104 mEq/L (ref 96–112)
GFR: 57.62 mL/min — ABNORMAL LOW (ref >60.00)
Glucose, Bld: 107 mg/dL — ABNORMAL HIGH (ref 70–99)
Sodium: 138 mEq/L (ref 135–145)
Total Protein: 7 g/dL (ref 6.0–8.3)

## 2013-01-04 LAB — LIPID PANEL: Cholesterol: 259 mg/dL — ABNORMAL HIGH (ref 0–200)

## 2013-01-04 LAB — LDL CHOLESTEROL, DIRECT: Direct LDL: 149.4 mg/dL

## 2013-01-11 ENCOUNTER — Encounter: Payer: Self-pay | Admitting: Family Medicine

## 2013-01-11 ENCOUNTER — Ambulatory Visit (INDEPENDENT_AMBULATORY_CARE_PROVIDER_SITE_OTHER): Payer: Medicare PPO | Admitting: Family Medicine

## 2013-01-11 VITALS — BP 122/72 | HR 52 | Temp 97.2°F | Ht 72.0 in | Wt 193.0 lb

## 2013-01-11 DIAGNOSIS — Z0000 Encounter for general adult medical examination without abnormal findings: Secondary | ICD-10-CM

## 2013-01-11 DIAGNOSIS — N400 Enlarged prostate without lower urinary tract symptoms: Secondary | ICD-10-CM

## 2013-01-11 DIAGNOSIS — E7800 Pure hypercholesterolemia, unspecified: Secondary | ICD-10-CM

## 2013-01-11 DIAGNOSIS — I10 Essential (primary) hypertension: Secondary | ICD-10-CM

## 2013-01-11 DIAGNOSIS — Z125 Encounter for screening for malignant neoplasm of prostate: Secondary | ICD-10-CM

## 2013-01-11 DIAGNOSIS — R7309 Other abnormal glucose: Secondary | ICD-10-CM

## 2013-01-11 NOTE — Assessment & Plan Note (Signed)
Stable on current meds 

## 2013-01-11 NOTE — Assessment & Plan Note (Signed)
Worsened control with worsened lifestyle. Get back on track. Recheck in 3 months. Pt refuses med at this time.

## 2013-01-11 NOTE — Assessment & Plan Note (Signed)
Well controlled. Continue current medication.  

## 2013-01-11 NOTE — Patient Instructions (Addendum)
Work on [employer] and regular exercise.  Return in 3 months for labs only to recheck the cholesterol.

## 2013-01-11 NOTE — Progress Notes (Signed)
HPI  I have personally reviewed the Medicare Annual Wellness questionnaire and have noted  1. The patient's medical and social history  2. Their use of alcohol, tobacco or illicit drugs  3. Their current medications and supplements  4. The patient's functional ability including ADL's, fall risks, home safety risks and hearing or visual  impairment.  5. Diet and physical activities  6. Evidence for depression or mood disorders  The patients weight, height, BMI and visual acuity have been recorded in the chart  I have made referrals, counseling and provided education to the patient based review of the above and I have provided the pt with a written personalized care plan for preventive services.   Elevated Cholesterol:  LDL NOT at goal <130 on no med  Has not been eating well on cruise. Lab Results  Component Value Date   CHOL 259* 01/04/2013   HDL 92.60 01/04/2013   LDLCALC 109 11/04/2006   LDLDIRECT 149.4 01/04/2013   TRIG 54.0 01/04/2013   CHOLHDL 3 01/04/2013   Diet compliance: Good but eating a lot of cheese.  Exercise: [employer] 3 times a week, gym as well. Other complaints:   BPH: on finasteride and flomax, gets up only 1-2 times a night.   Hypertension:  Well controlled on losartan Using medication without problems or lightheadedness: None Chest pain with exertion:None Edema:None Short of breath:None Average home BPs:119-127/64-84 Other issues:   Review of Systems  Constitutional: Negative for fever, fatigue and unexpected weight change.  HENT: Negative for ear pain, congestion, sore throat, rhinorrhea, trouble swallowing and postnasal drip.  Eyes: Negative for pain.  Respiratory: Negative for cough, shortness of breath and wheezing.  Cardiovascular: Negative for chest pain, palpitations and leg swelling.  Gastrointestinal: Negative for nausea, abdominal pain, diarrhea, constipation and blood in stool.  Genitourinary: Negative for dysuria, urgency, hematuria,  discharge, penile swelling, scrotal swelling, difficulty urinating, penile pain and testicular pain.  Skin: Negative for rash.  Neurological: Negative for syncope, weakness, light-headedness, numbness and headaches.  Psychiatric/Behavioral: Negative for behavioral problems and dysphoric mood. The patient is not nervous/anxious.  Objective:   Physical Exam  Constitutional: He appears well-developed and well-nourished. Non-toxic appearance. He does not appear ill. No distress.  HENT:  Head: Normocephalic and atraumatic.  Right Ear: Hearing, tympanic membrane, external ear and ear canal normal.  Left Ear: Hearing, tympanic membrane, external ear and ear canal normal.  Nose: Nose normal.  Mouth/Throat: Uvula is midline, oropharynx is clear and moist and mucous membranes are normal.  Eyes: Conjunctivae, EOM and lids are normal. Pupils are equal, round, and reactive to light. No foreign bodies found.  Neck: Trachea normal, normal range of motion and phonation normal. Neck supple. Carotid bruit is not present. No mass and no thyromegaly present.  Cardiovascular: Normal rate, regular rhythm, S1 normal, S2 normal, intact distal pulses and normal pulses. Exam reveals no gallop.  No murmur heard.  Pulmonary/Chest: Breath sounds normal. He has no wheezes. He has no rhonchi. He has no rales.  Abdominal: Soft. Normal appearance and bowel sounds are normal. There is no hepatosplenomegaly. There is no tenderness. There is no rebound, no guarding and no CVA tenderness. No hernia. Hernia confirmed negative in the right inguinal area and confirmed negative in the left inguinal area.  Genitourinary: Prostate normal, testes normal and penis normal. Rectal exam shows no external hemorrhoid, no internal hemorrhoid, no fissure, no mass, no tenderness and anal tone normal. Guaiac negative stool. Prostate is not enlarged and not tender.  Right testis shows no mass and no tenderness. Left testis shows no mass and no  tenderness. No paraphimosis or penile tenderness.  Lymphadenopathy:  He has no cervical adenopathy.  Right: No inguinal adenopathy present.  Left: No inguinal adenopathy present.  Neurological: He is alert. He has normal strength and normal reflexes. No cranial nerve deficit or sensory deficit. Gait normal.  Skin: Skin is warm, dry and intact. No rash noted.  Psychiatric: He has a normal mood and affect. His speech is normal and behavior is normal. Judgment normal.  Assessment & Plan:   The patient's preventative maintenance and recommended screening tests for an annual wellness exam were reviewed in full today.  Brought up to date unless services declined.  Counselled on the importance of diet, exercise, and its role in overall health and mortality.  The patient's FH and SH was reviewed, including their home life, tobacco status, and drug and alcohol status.   Colon:2009 Dr. Arlyce Dice, repeat in 10 years.  prostate: Plan PSA every other year.. Will check this year. Lab Results  Component Value Date   PSA 0.49 08/06/2010   PSA 0.43 11/24/2007   PSA 0.44 11/04/2006  Nonsmoker  Vaccine:Uptodat, flu vaccine at [employer].

## 2013-01-11 NOTE — Assessment & Plan Note (Signed)
Slightly worsened control. Get back on track with diet. A1C stable from 6 years ago.

## 2013-03-13 ENCOUNTER — Encounter: Payer: Self-pay | Admitting: Family Medicine

## 2013-03-13 ENCOUNTER — Telehealth: Payer: Self-pay

## 2013-03-13 ENCOUNTER — Ambulatory Visit (INDEPENDENT_AMBULATORY_CARE_PROVIDER_SITE_OTHER): Payer: Medicare PPO | Admitting: Family Medicine

## 2013-03-13 VITALS — BP 130/76 | HR 56 | Temp 97.7°F | Ht 72.0 in | Wt 194.2 lb

## 2013-03-13 DIAGNOSIS — R55 Syncope and collapse: Secondary | ICD-10-CM

## 2013-03-13 DIAGNOSIS — I10 Essential (primary) hypertension: Secondary | ICD-10-CM

## 2013-03-13 NOTE — Progress Notes (Signed)
  Subjective:    Patient ID: Dustin Harrison, male    DOB: Jan 20, 1932, 79 y.o.   MRN: [id number]  HPI  79 year old male with history of pheochromocytoma, HTN and high chol presetns following syncopal episode 2 weeks ago.  During urination in middle of night, he felt nauseous and dizzy.  Had LOC. He woke up few moments later on floor.. Hit left shoulder and buttocks.  went back to bed.  No proceeding palpitations or chest pain.  Felt slightly weak the next day. Since then he has been feeling well. BP have been more elevated 165-147/70-80  Hr 57-62.  No headache, no dizziness, no further syncopal episodes.   He is taking losartan 50 mg daily.  No episodic shakiness, BP spikes, no weight loss, sweats etc.     Review of Systems  Constitutional: Negative for fever and fatigue.  HENT: Negative for ear pain.   Eyes: Negative for pain.  Respiratory: Negative for shortness of breath.   Cardiovascular: Negative for chest pain, palpitations and leg swelling.  Gastrointestinal: Negative for abdominal pain.       Objective:   Physical Exam  Constitutional: He is oriented to person, place, and time. Vital signs are normal. He appears well-developed and well-nourished.  Elderly male in NAD  HENT:  Head: Normocephalic.  Right Ear: Hearing normal.  Left Ear: Hearing normal.  Nose: Nose normal.  Mouth/Throat: Oropharynx is clear and moist and mucous membranes are normal.  Neck: Trachea normal. Carotid bruit is not present. No mass and no thyromegaly present.  Cardiovascular: Normal rate, regular rhythm and normal pulses.  Exam reveals no gallop, no distant heart sounds and no friction rub.   No murmur heard. No peripheral edema  Pulmonary/Chest: Effort normal and breath sounds normal. No respiratory distress.  Neurological: He is alert and oriented to person, place, and time. He has normal reflexes. He displays normal reflexes. No cranial nerve deficit or sensory deficit. He exhibits  normal muscle tone. He displays a negative Romberg sign. Coordination and gait normal.  Skin: Skin is warm, dry and intact. No rash noted.  Psychiatric: He has a normal mood and affect. His speech is normal and behavior is normal. Thought content normal.          Assessment & Plan:

## 2013-03-13 NOTE — Assessment & Plan Note (Addendum)
Most consistent with post micturation syncope and possible orthostatic hypotension following getting up rapidly in middle of night.  EKG showed: bradycardia..the patient states he has always had slow HR.  Will eval with labs as well. If recurrent .Marland Kitchen Will refer to cards for further eval.

## 2013-03-13 NOTE — Patient Instructions (Addendum)
Increase losartan to 100 mg daily. Return for lab 7-10 days after increasing BP med. Follow BP at home.. Goal <140/90. Call in two week with BP measurements.  If passing out or dizziness returns... Call for cardiology referral.

## 2013-03-13 NOTE — Telephone Encounter (Signed)
Pt went to bathroom at 3AM on 03/04/13 with nausea and became dizzy; pt passed out in bathroom floor; not sure how long unconscious. No CP,h/a,or SOB. Pt BP has been averaging 147/75; pt has bruise on lt side of arm and buttock. Pt can move arm and walk OK. Pt said did not know if should be seen but since BP consistently remaining high thought should see Dr Ermalene Searing.Pt scheduled appt to see Dr Ermalene Searing today at [facility]; if condition changes or worsens prior to appt pt to cb.

## 2013-03-13 NOTE — Assessment & Plan Note (Addendum)
Inadequate control.. Increase losartan to 100 mg daily.  Follow at home and call with results.  Recheck creatinine following higher dose in 7-10 days.

## 2013-04-05 ENCOUNTER — Other Ambulatory Visit: Payer: Self-pay | Admitting: Family Medicine

## 2013-04-05 ENCOUNTER — Other Ambulatory Visit (INDEPENDENT_AMBULATORY_CARE_PROVIDER_SITE_OTHER): Payer: Medicare PPO

## 2013-04-05 DIAGNOSIS — Z125 Encounter for screening for malignant neoplasm of prostate: Secondary | ICD-10-CM

## 2013-04-05 DIAGNOSIS — E7800 Pure hypercholesterolemia, unspecified: Secondary | ICD-10-CM

## 2013-04-05 DIAGNOSIS — R55 Syncope and collapse: Secondary | ICD-10-CM

## 2013-04-05 DIAGNOSIS — Z79899 Other long term (current) drug therapy: Secondary | ICD-10-CM

## 2013-04-05 LAB — CBC WITH DIFFERENTIAL/PLATELET
Basophils Relative: 0.3 % (ref 0.0–3.0)
Eosinophils Relative: 2.4 % (ref 0.0–5.0)
HCT: 41.4 % (ref 39.0–52.0)
Monocytes Relative: 11.6 % (ref 3.0–12.0)
Neutrophils Relative %: 60.3 % (ref 43.0–77.0)
Platelets: 211 10*3/uL (ref 150.0–400.0)
RBC: 4.43 Mil/uL (ref 4.22–5.81)
WBC: 5.8 10*3/uL (ref 4.5–10.5)

## 2013-04-05 LAB — LDL CHOLESTEROL, DIRECT: Direct LDL: 126.2 mg/dL

## 2013-04-05 LAB — COMPREHENSIVE METABOLIC PANEL
AST: 25 U/L (ref 0–37)
Albumin: 4.1 g/dL (ref 3.5–5.2)
BUN: 21 mg/dL (ref 6–23)
CO2: 29 mEq/L (ref 19–32)
Creatinine, Ser: 1.2 mg/dL (ref 0.4–1.5)
GFR: 60.86 mL/min (ref >60.00)
Glucose, Bld: 120 mg/dL — ABNORMAL HIGH (ref 70–99)
Sodium: 140 mEq/L (ref 135–145)
Total Bilirubin: 1.1 mg/dL (ref 0.3–1.2)
Total Protein: 6.6 g/dL (ref 6.0–8.3)

## 2013-04-05 LAB — TSH: TSH: 2.44 u[IU]/mL (ref 0.35–5.50)

## 2013-04-05 LAB — LIPID PANEL
Cholesterol: 234 mg/dL — ABNORMAL HIGH (ref 0–200)
HDL: 96.1 mg/dL (ref >39.00)
Triglycerides: 37 mg/dL (ref 0.0–149.0)
VLDL: 7.4 mg/dL (ref 0.0–40.0)

## 2013-04-05 LAB — PSA, MEDICARE: PSA: 0.73 ng/ml (ref 0.10–4.00)

## 2013-04-09 ENCOUNTER — Other Ambulatory Visit: Payer: Medicare PPO

## 2013-04-09 ENCOUNTER — Encounter: Payer: Self-pay | Admitting: Family Medicine

## 2013-04-10 MED ORDER — LOSARTAN POTASSIUM 100 MG PO TABS: 100.0000 mg | ORAL_TABLET | Freq: Every day | ORAL | Status: DC

## 2013-04-19 ENCOUNTER — Other Ambulatory Visit: Payer: Medicare PPO

## 2013-09-05 ENCOUNTER — Ambulatory Visit (INDEPENDENT_AMBULATORY_CARE_PROVIDER_SITE_OTHER): Payer: Medicare PPO | Admitting: Family Medicine

## 2013-09-05 ENCOUNTER — Encounter: Payer: Self-pay | Admitting: Family Medicine

## 2013-09-05 VITALS — BP 120/80 | HR 53 | Temp 97.5°F | Ht 72.0 in | Wt 196.5 lb

## 2013-09-05 DIAGNOSIS — S61219A Laceration without foreign body of unspecified finger without damage to nail, initial encounter: Secondary | ICD-10-CM

## 2013-09-05 DIAGNOSIS — S61209A Unspecified open wound of unspecified finger without damage to nail, initial encounter: Secondary | ICD-10-CM

## 2013-09-05 DIAGNOSIS — Z23 Encounter for immunization: Secondary | ICD-10-CM

## 2013-09-05 MED ORDER — AMOXICILLIN-POT CLAVULANATE 875-125 MG PO TABS: 1.0000 | ORAL_TABLET | Freq: Two times a day (BID) | ORAL | Status: DC

## 2013-09-05 MED ORDER — OMEPRAZOLE 20 MG PO CPDR: 20.0000 mg | DELAYED_RELEASE_CAPSULE | Freq: Every day | ORAL | Status: DC

## 2013-09-05 NOTE — Progress Notes (Signed)
Pre visit review using our clinic review tool, if applicable. No additional management support is needed unless otherwise documented below in the visit note. 

## 2013-09-05 NOTE — Progress Notes (Signed)
Date:  09/05/2013   Name:  Dustin Harrison   DOB:  Jan 03, 1932   MRN:  [id number] Gender: male Age: 80 y.o.  Primary Physician:  Eliezer Lofts, MD   Chief Complaint: thumb pain   Subjective:   History of Present Illness:  Dustin Harrison is a 80 y.o. very pleasant male patient who presents with the following:  Left finger, cut his finger this cut yesterday morning. The patient reports that he was cutting some sausage, and then he cut the volar aspect of his finger, and this bled quite a bit. He did not wash it at all, and he placed a large bandage on it. He is here today for evaluation. His flexion and extension are intact. He plans to play a golf weekend over the long weekend. Easter.  Past Medical History, Surgical History, Social History, Family History, Problem List, Medications, and Allergies have been reviewed and updated if relevant.  Review of Systems:  GEN: No acute illnesses, no fevers, chills. GI: No n/v/d, eating normally Pulm: No SOB Interactive and getting along well at home.  Otherwise, ROS is as per the HPI.  Objective:   Physical Examination: BP 120/80  Pulse 53  Temp(Src) 97.5 F (36.4 C) (Oral)  Ht 6' (1.829 m)  Wt 196 lb 8 oz (89.132 kg)  BMI 26.64 kg/m2   GEN: WDWN, NAD, Non-toxic, Alert & Oriented x 3 HEENT: Atraumatic, Normocephalic.  Ears and Nose: No external deformity. EXTR: No clubbing/cyanosis/edema NEURO: Normal gait.  PSYCH: Normally interactive. Conversant. Not depressed or anxious appearing.  Calm demeanor.   Left first digit, there is a large volar incision greater than 1 inch that is at this point minimally bleeding. The patient has full extension and flexion at all digits and that all joints. There is no significant surrounding redness or warmth.  Laboratory and Imaging Data:  Assessment & Plan:   Finger laceration  Need for prophylactic vaccination with combined diphtheria-tetanus-pertussis (DTP) vaccine - Plan: Tdap vaccine  greater than or equal to 7yo IM  Finger laceration with open wound, date of injury, 09/04/2013.  The wound is unclean, and it was initially cut with a knife cutting sausage. Instructed the patient to wash his hands a least twice a day with soap and water. It would be inappropriate to attempt to close this wound. It needs to heal by secondary intention.  Given high risk status of this type of injury, place the patient on Augmentin. I discouraged him from playing golf over the weekend, but he stated that he would not he does advise. If he has warmth or redness, I made sure that he understood that this is an emergent problem that may need surgical intervention.  No Follow-up on file.  Orders Placed This Encounter  Procedures  . Tdap vaccine greater than or equal to 7yo IM   Patient's Medications  New Prescriptions   AMOXICILLIN-CLAVULANATE (AUGMENTIN) 875-125 MG PER TABLET    Take 1 tablet by mouth 2 (two) times daily.  Previous Medications   ASPIRIN 81 MG TABLET    Take one by mouth every other day   BIMATOPROST (LUMIGAN) 0.01 % SOLN    Use one drop in each eye   DOCUSATE SODIUM (COLACE) 100 MG CAPSULE    Take one capsule by mouth every other day   FINASTERIDE (PROSCAR) 5 MG TABLET       LEVITRA 20 MG TABLET    TAKE ONE-HALF TO ONE TABLET BY MOUTH AS NEEDED  LOSARTAN (COZAAR) 100 MG TABLET    Take 1 tablet (100 mg total) by mouth daily.   MULTIPLE VITAMIN (MULTIVITAMIN) TABLET    Take one by mouth every other day   TAMSULOSIN (FLOMAX) 0.4 MG CAPS    TAKE ONE CAPSULE BY MOUTH EVERY DAY   TIMOLOL (TIMOPTIC) 0.5 % OPHTHALMIC SOLUTION    Place 1 drop into both eyes daily.   Modified Medications   Modified Medication Previous Medication   OMEPRAZOLE (PRILOSEC) 20 MG CAPSULE omeprazole (PRILOSEC) 20 MG capsule      Take 1 capsule (20 mg total) by mouth daily.    Take 20 mg by mouth daily.  Discontinued Medications   No medications on file   There are no Patient Instructions on file for  this visit.  Signed,  Maud Deed. Cutter Passey, MD, [place] at [facility] [place] [address] Phone: [phone] Fax: [phone]

## 2013-09-18 ENCOUNTER — Other Ambulatory Visit: Payer: Self-pay | Admitting: Family Medicine

## 2013-12-20 ENCOUNTER — Other Ambulatory Visit: Payer: Self-pay | Admitting: Family Medicine

## 2014-01-10 ENCOUNTER — Telehealth: Payer: Self-pay | Admitting: Family Medicine

## 2014-01-10 ENCOUNTER — Other Ambulatory Visit (INDEPENDENT_AMBULATORY_CARE_PROVIDER_SITE_OTHER): Payer: Medicare PPO

## 2014-01-10 DIAGNOSIS — E7800 Pure hypercholesterolemia, unspecified: Secondary | ICD-10-CM

## 2014-01-10 DIAGNOSIS — R7309 Other abnormal glucose: Secondary | ICD-10-CM

## 2014-01-10 DIAGNOSIS — I10 Essential (primary) hypertension: Secondary | ICD-10-CM

## 2014-01-10 LAB — COMPREHENSIVE METABOLIC PANEL
ALBUMIN: 4 g/dL (ref 3.5–5.2)
ALT: 27 U/L (ref 0–53)
AST: 25 U/L (ref 0–37)
Alkaline Phosphatase: 55 U/L (ref 39–117)
BUN: 25 mg/dL — ABNORMAL HIGH (ref 6–23)
CHLORIDE: 104 meq/L (ref 96–112)
CO2: 30 mEq/L (ref 19–32)
Calcium: 9.3 mg/dL (ref 8.4–10.5)
Creatinine, Ser: 1.4 mg/dL (ref 0.4–1.5)
GFR: 53.59 mL/min — ABNORMAL LOW (ref >60.00)
Glucose, Bld: 118 mg/dL — ABNORMAL HIGH (ref 70–99)
POTASSIUM: 4.2 meq/L (ref 3.5–5.1)
Sodium: 138 mEq/L (ref 135–145)
Total Bilirubin: 1.1 mg/dL (ref 0.2–1.2)
Total Protein: 6.7 g/dL (ref 6.0–8.3)

## 2014-01-10 LAB — LIPID PANEL
CHOL/HDL RATIO: 3
Cholesterol: 230 mg/dL — ABNORMAL HIGH (ref 0–200)
HDL: 89.3 mg/dL (ref >39.00)
LDL Cholesterol: 132 mg/dL — ABNORMAL HIGH (ref 0–99)
NonHDL: 140.7
Triglycerides: 42 mg/dL (ref 0.0–149.0)
VLDL: 8.4 mg/dL (ref 0.0–40.0)

## 2014-01-10 NOTE — Telephone Encounter (Signed)
Message copied by Jinny Sanders on Thu Jan 10, 2014  8:18 AM ------      Message from: Ellamae Sia      Created: Tue Jan 01, 2014  3:59 PM      Regarding: Lab orders for Thursday, 8.6.15       Patient is scheduled for CPX labs, please order future labs, Thanks , Terri       ------

## 2014-01-11 ENCOUNTER — Telehealth: Payer: Self-pay | Admitting: Family Medicine

## 2014-01-11 NOTE — Telephone Encounter (Signed)
Relevant patient education assigned to patient using Emmi. ° °

## 2014-01-17 ENCOUNTER — Encounter: Payer: Self-pay | Admitting: Family Medicine

## 2014-01-17 ENCOUNTER — Ambulatory Visit (INDEPENDENT_AMBULATORY_CARE_PROVIDER_SITE_OTHER): Payer: Medicare PPO | Admitting: Family Medicine

## 2014-01-17 VITALS — BP 120/66 | HR 56 | Temp 98.2°F | Ht 72.0 in | Wt 190.0 lb

## 2014-01-17 DIAGNOSIS — Z23 Encounter for immunization: Secondary | ICD-10-CM

## 2014-01-17 DIAGNOSIS — Z0000 Encounter for general adult medical examination without abnormal findings: Secondary | ICD-10-CM

## 2014-01-17 DIAGNOSIS — R1310 Dysphagia, unspecified: Secondary | ICD-10-CM | POA: Insufficient documentation

## 2014-01-17 NOTE — Progress Notes (Signed)
HPI  I have personally reviewed the Medicare Annual Wellness questionnaire and have noted  1. The patient's medical and social history  2. Their use of alcohol, tobacco or illicit drugs  3. Their current medications and supplements  4. The patient's functional ability including ADL's, fall risks, home safety risks and hearing or visual  impairment.  5. Diet and physical activities  6. Evidence for depression or mood disorders  The patients weight, height, BMI and visual acuity have been recorded in the chart  I have made referrals, counseling and provided education to the patient based review of the above and I have provided the pt with a written personalized care plan for preventive services.   He has been having coughing fits after eating nuts/things get stuck.  no liquids get stuck. Tried prilosec 20 mg daily to every other day for the last year.  Has helped a little.  No GERD. Has never had endoscopy.  Elevated Cholesterol: LDL almost at goal <130 on no med  Lab Results  Component Value Date   CHOL 230* 01/10/2014   HDL 89.30 01/10/2014   LDLCALC 132* 01/10/2014   LDLDIRECT 126.2 04/05/2013   TRIG 42.0 01/10/2014   CHOLHDL 3 01/10/2014  Diet compliance: Good Exercise: [employer] 3 times a week, gym as well.  Other complaints:   BPH: on finasteride and flomax, gets up only 1-2 times a night.   Hypertension: Well controlled on losartan  BP Readings from Last 3 Encounters:  01/17/14 120/66  09/05/13 120/80  03/13/13 130/76  Using medication without problems or lightheadedness: None  Chest pain with exertion:None  Edema:None  Short of breath:None  Average home BPs:120-130/70-80 Other issues:   Review of Systems  Constitutional: Negative for fever, fatigue and unexpected weight change.  HENT: Negative for ear pain, congestion, sore throat, rhinorrhea, trouble swallowing and postnasal drip.  Eyes: Negative for pain.  Respiratory: Negative for cough, shortness of breath and  wheezing.  Cardiovascular: Negative for chest pain, palpitations and leg swelling.  Gastrointestinal: Negative for nausea, abdominal pain, diarrhea, constipation and blood in stool.  Genitourinary: Negative for dysuria, urgency, hematuria, discharge, penile swelling, scrotal swelling, difficulty urinating, penile pain and testicular pain.  Skin: Negative for rash.  Neurological: Negative for syncope, weakness, light-headedness, numbness and headaches.  Psychiatric/Behavioral: Negative for behavioral problems and dysphoric mood. The patient is not nervous/anxious.  Objective:   Physical Exam  Constitutional: He appears well-developed and well-nourished. Non-toxic appearance. He does not appear ill. No distress.  HENT:  Head: Normocephalic and atraumatic.  Right Ear: Hearing, tympanic membrane, external ear and ear canal normal.  Left Ear: Hearing, tympanic membrane, external ear and ear canal normal.  Nose: Nose normal.  Mouth/Throat: Uvula is midline, oropharynx is clear and moist and mucous membranes are normal.  Eyes: Conjunctivae, EOM and lids are normal. Pupils are equal, round, and reactive to light. No foreign bodies found.  Neck: Trachea normal, normal range of motion and phonation normal. Neck supple. Carotid bruit is not present. No mass and no thyromegaly present.  Cardiovascular: Normal rate, regular rhythm, S1 normal, S2 normal, intact distal pulses and normal pulses. Exam reveals no gallop.  No murmur heard.  Pulmonary/Chest: Breath sounds normal. He has no wheezes. He has no rhonchi. He has no rales.  Abdominal: Soft. Normal appearance and bowel sounds are normal. There is no hepatosplenomegaly. There is no tenderness. There is no rebound, no guarding and no CVA tenderness. No hernia. Hernia confirmed negative in the right inguinal  area and confirmed negative in the left inguinal area.  Genitourinary: Prostate normal, testes normal and penis normal. Rectal exam shows no external  hemorrhoid, no internal hemorrhoid, no fissure, no mass, no tenderness and anal tone normal. Guaiac negative stool. Prostate is mildly enlarged and not tender. Right testis shows no mass and no tenderness. Left testis shows no mass and no tenderness. No paraphimosis or penile tenderness.  Lymphadenopathy:  He has no cervical adenopathy.  Right: No inguinal adenopathy present.  Left: No inguinal adenopathy present.  Neurological: He is alert. He has normal strength and normal reflexes. No cranial nerve deficit or sensory deficit. Gait normal.  Skin: Skin is warm, dry and intact. No rash noted.  Psychiatric: He has a normal mood and affect. His speech is normal and behavior is normal. Judgment normal.  Assessment & Plan:   The patient's preventative maintenance and recommended screening tests for an annual wellness exam were reviewed in full today.  Brought up to date unless services declined.  Counselled on the importance of diet, exercise, and its role in overall health and mortality.  The patient's FH and SH was reviewed, including their home life, tobacco status, and drug and alcohol status.   Colon:2009 Dr. Deatra Ina, repeat in 10 years.  prostate: Plan PSA every other year, continue yearly rectal. Lab Results  Component Value Date   PSA 0.73 04/05/2013   PSA 0.49 08/06/2010   PSA 0.43 11/24/2007  Nonsmoker  Vaccine:Uptodate, flu vaccine at [employer].

## 2014-01-17 NOTE — Addendum Note (Signed)
Addended by: Carter Kitten on: 01/17/2014 11:24 AM   Modules accepted: Orders

## 2014-01-17 NOTE — Patient Instructions (Addendum)
Stop prilosec.Marland Kitchen See if cough returns.  If so take 40 mg omeprazole daily x 2 weeks.  If swallowing issues not resolved call for referral to to GI for possible endoscopy.  Work on [employer] and continue exercise.

## 2014-01-17 NOTE — Assessment & Plan Note (Signed)
Pt plans to d/c omeprazole to see if any change in symtpoms. If worse than increase to 40 mg x 2 weeks. If not improving refer to GI for likley endo.

## 2014-01-17 NOTE — Progress Notes (Signed)
Pre visit review using our clinic review tool, if applicable. No additional management support is needed unless otherwise documented below in the visit note. 

## 2014-03-13 ENCOUNTER — Other Ambulatory Visit: Payer: Self-pay | Admitting: Family Medicine

## 2014-09-08 ENCOUNTER — Other Ambulatory Visit: Payer: Self-pay | Admitting: Family Medicine

## 2014-09-11 ENCOUNTER — Other Ambulatory Visit: Payer: Self-pay | Admitting: Family Medicine

## 2014-10-04 DIAGNOSIS — H4011X2 Primary open-angle glaucoma, moderate stage: Secondary | ICD-10-CM | POA: Diagnosis not present

## 2014-10-07 DIAGNOSIS — H4011X2 Primary open-angle glaucoma, moderate stage: Secondary | ICD-10-CM | POA: Diagnosis not present

## 2014-11-29 ENCOUNTER — Encounter: Payer: Self-pay | Admitting: Gastroenterology

## 2014-12-18 DIAGNOSIS — D0461 Carcinoma in situ of skin of right upper limb, including shoulder: Secondary | ICD-10-CM | POA: Diagnosis not present

## 2014-12-18 DIAGNOSIS — C44519 Basal cell carcinoma of skin of other part of trunk: Secondary | ICD-10-CM | POA: Diagnosis not present

## 2015-01-20 ENCOUNTER — Telehealth: Payer: Self-pay

## 2015-01-20 DIAGNOSIS — H9193 Unspecified hearing loss, bilateral: Secondary | ICD-10-CM

## 2015-01-20 NOTE — Telephone Encounter (Signed)
Pt left v/m that was seen by audiologist at [facility] and was advised to see an ENT; pt request referral to [facility]NT; pt last annual 01/17/14 and do not see future appt scheduled.Please advise.

## 2015-01-21 NOTE — Telephone Encounter (Signed)
Shirlean Mylar, Will you please call patient and schedule Medicare Wellness with fasting labs prior with Dr. Diona Browner.  Also let him know that Dr. Diona Browner sent the referral for ENT but there are no ENT in [place].

## 2015-01-21 NOTE — Telephone Encounter (Signed)
Needs apt for medicare wellness.  FYI: There is no [place] [facility].. I will make referral.

## 2015-01-21 NOTE — Telephone Encounter (Signed)
See below

## 2015-01-21 NOTE — Telephone Encounter (Signed)
cpx 11/8 Lab 11/2 Pt aware

## 2015-02-17 DIAGNOSIS — H4011X2 Primary open-angle glaucoma, moderate stage: Secondary | ICD-10-CM | POA: Diagnosis not present

## 2015-02-21 ENCOUNTER — Other Ambulatory Visit: Payer: Self-pay | Admitting: Family Medicine

## 2015-03-04 ENCOUNTER — Telehealth: Payer: Self-pay | Admitting: Family Medicine

## 2015-03-04 NOTE — Telephone Encounter (Signed)
Pt states that he is returning call, cb number is [phone] Thanks

## 2015-03-04 NOTE — Telephone Encounter (Signed)
I have not tried to call Mr. Henegar.  Rosaria Ferries did you try to call Mr. Pohle about a referral??

## 2015-03-10 ENCOUNTER — Other Ambulatory Visit: Payer: Self-pay | Admitting: Family Medicine

## 2015-03-13 DIAGNOSIS — H903 Sensorineural hearing loss, bilateral: Secondary | ICD-10-CM | POA: Diagnosis not present

## 2015-03-26 ENCOUNTER — Telehealth: Payer: Self-pay

## 2015-03-26 NOTE — Telephone Encounter (Signed)
Pt lmovm requesting Rx for conjunctivitis of right eye sent to pharmacy--offered pt appt for tomorrow to see dr Burnett Corrente states that he will go to [facility]

## 2015-03-27 DIAGNOSIS — H2000 Unspecified acute and subacute iridocyclitis: Secondary | ICD-10-CM | POA: Diagnosis not present

## 2015-04-03 DIAGNOSIS — H2000 Unspecified acute and subacute iridocyclitis: Secondary | ICD-10-CM | POA: Diagnosis not present

## 2015-04-07 ENCOUNTER — Other Ambulatory Visit (INDEPENDENT_AMBULATORY_CARE_PROVIDER_SITE_OTHER): Payer: Medicare PPO

## 2015-04-07 DIAGNOSIS — Z0000 Encounter for general adult medical examination without abnormal findings: Secondary | ICD-10-CM

## 2015-04-07 DIAGNOSIS — E7800 Pure hypercholesterolemia, unspecified: Secondary | ICD-10-CM

## 2015-04-07 DIAGNOSIS — Z125 Encounter for screening for malignant neoplasm of prostate: Secondary | ICD-10-CM | POA: Diagnosis not present

## 2015-04-07 DIAGNOSIS — R7309 Other abnormal glucose: Secondary | ICD-10-CM | POA: Diagnosis not present

## 2015-04-07 DIAGNOSIS — I10 Essential (primary) hypertension: Secondary | ICD-10-CM

## 2015-04-08 LAB — PSA, MEDICARE: PSA: 1.03 ng/ml (ref 0.10–4.00)

## 2015-04-08 LAB — CBC WITH DIFFERENTIAL/PLATELET
Basophils Absolute: 0.1 10*3/uL (ref 0.0–0.1)
Basophils Relative: 0.9 % (ref 0.0–3.0)
Eosinophils Absolute: 0 10*3/uL (ref 0.0–0.7)
Eosinophils Relative: 0.4 % (ref 0.0–5.0)
HCT: 41.3 % (ref 39.0–52.0)
Hemoglobin: 13.5 g/dL (ref 13.0–17.0)
Lymphocytes Relative: 18 % (ref 12.0–46.0)
Lymphs Abs: 1.5 10*3/uL (ref 0.7–4.0)
MCHC: 32.6 g/dL (ref 30.0–36.0)
MCV: 95.3 fl (ref 78.0–100.0)
Monocytes Absolute: 0.7 10*3/uL (ref 0.1–1.0)
Monocytes Relative: 8.7 % (ref 3.0–12.0)
Neutro Abs: 6.1 10*3/uL (ref 1.4–7.7)
Neutrophils Relative %: 72 % (ref 43.0–77.0)
Platelets: 233 10*3/uL (ref 150.0–400.0)
RBC: 4.34 Mil/uL (ref 4.22–5.81)
RDW: 13.4 % (ref 11.5–15.5)
WBC: 8.5 10*3/uL (ref 4.0–10.5)

## 2015-04-08 LAB — LIPID PANEL
CHOLESTEROL: 218 mg/dL — AB (ref 0–200)
HDL: 90.8 mg/dL (ref >39.00)
LDL Cholesterol: 104 mg/dL — ABNORMAL HIGH (ref 0–99)
NONHDL: 127.56
TRIGLYCERIDES: 117 mg/dL (ref 0.0–149.0)
Total CHOL/HDL Ratio: 2
VLDL: 23.4 mg/dL (ref 0.0–40.0)

## 2015-04-08 LAB — COMPREHENSIVE METABOLIC PANEL
ALT: 25 U/L (ref 0–53)
AST: 23 U/L (ref 0–37)
Albumin: 4 g/dL (ref 3.5–5.2)
Alkaline Phosphatase: 65 U/L (ref 39–117)
BUN: 20 mg/dL (ref 6–23)
CO2: 28 mEq/L (ref 19–32)
Calcium: 9.2 mg/dL (ref 8.4–10.5)
Chloride: 103 mEq/L (ref 96–112)
Creatinine, Ser: 1.29 mg/dL (ref 0.40–1.50)
GFR: 56.78 mL/min — ABNORMAL LOW (ref >60.00)
Glucose, Bld: 100 mg/dL — ABNORMAL HIGH (ref 70–99)
Potassium: 4.3 mEq/L (ref 3.5–5.1)
Sodium: 138 mEq/L (ref 135–145)
Total Bilirubin: 0.5 mg/dL (ref 0.2–1.2)
Total Protein: 6.5 g/dL (ref 6.0–8.3)

## 2015-04-08 LAB — HEMOGLOBIN A1C: Hgb A1c MFr Bld: 6.2 % (ref 4.6–6.5)

## 2015-04-09 ENCOUNTER — Other Ambulatory Visit: Payer: Medicare PPO

## 2015-04-15 ENCOUNTER — Ambulatory Visit (INDEPENDENT_AMBULATORY_CARE_PROVIDER_SITE_OTHER): Payer: Medicare PPO | Admitting: Family Medicine

## 2015-04-15 ENCOUNTER — Encounter: Payer: Self-pay | Admitting: Family Medicine

## 2015-04-15 VITALS — BP 104/62 | HR 62 | Temp 98.5°F | Ht 71.5 in | Wt 192.5 lb

## 2015-04-15 DIAGNOSIS — N400 Enlarged prostate without lower urinary tract symptoms: Secondary | ICD-10-CM

## 2015-04-15 DIAGNOSIS — E7800 Pure hypercholesterolemia, unspecified: Secondary | ICD-10-CM

## 2015-04-15 DIAGNOSIS — R7309 Other abnormal glucose: Secondary | ICD-10-CM | POA: Diagnosis not present

## 2015-04-15 DIAGNOSIS — Z7189 Other specified counseling: Secondary | ICD-10-CM | POA: Insufficient documentation

## 2015-04-15 DIAGNOSIS — Z0000 Encounter for general adult medical examination without abnormal findings: Secondary | ICD-10-CM | POA: Diagnosis not present

## 2015-04-15 DIAGNOSIS — I10 Essential (primary) hypertension: Secondary | ICD-10-CM

## 2015-04-15 NOTE — Progress Notes (Signed)
HPI  I have personally reviewed the Medicare Annual Wellness questionnaire and have noted  1. The patient's medical and social history  2. Their use of alcohol, tobacco or illicit drugs  3. Their current medications and supplements  4. The patient's functional ability including ADL's, fall risks, home safety risks and hearing or visual  impairment.  5. Diet and physical activities  6. Evidence for depression or mood disorders  The patients weight, height, BMI and visual acuity have been recorded in the chart  I have made referrals, counseling and provided education to the patient based review of the above and I have provided the pt with a written personalized care plan for preventive services.   Hearing is worsening. Going to  [facility].  Elevated Cholesterol:  Improved, LDL at goal <130 on no med  Lab Results  Component Value Date   CHOL 218* 04/07/2015   HDL 90.80 04/07/2015   LDLCALC 104* 04/07/2015   LDLDIRECT 126.2 04/05/2013   TRIG 117.0 04/07/2015   CHOLHDL 2 04/07/2015   Diet compliance: Good  Exercise: [employer] 2 times a week, core one time a week, golf weekly gym as well.  Other complaints:   BPH: on finasteride and flomax, gets up only 1-2 times a night.   Hypertension: Well controlled on losartan  BP Readings from Last 3 Encounters:  04/15/15 104/62  01/17/14 120/66  09/05/13 120/80   Using medication without problems or lightheadedness: None  Chest pain with exertion:None  Edema:None  Short of breath:None  Average home BPs:120-130/70-80 Other issues:    Prediabetes:  Improved.  Lab Results  Component Value Date   HGBA1C 6.2 04/07/2015     Review of Systems  Constitutional: Negative for fever, fatigue and unexpected weight change.  HENT: Negative for ear pain, congestion, sore throat, rhinorrhea, trouble swallowing and postnasal drip.  Eyes: Negative for pain.  Respiratory: Negative for cough, shortness of breath and  wheezing.  Cardiovascular: Negative for chest pain, palpitations and leg swelling.  Gastrointestinal: Negative for nausea, abdominal pain, diarrhea,  HAs constipation controlled on colace and blood in stool.  Genitourinary: Has nocturia , few times a night> Negative for dysuria, urgency, hematuria, discharge, penile swelling, scrotal swelling, difficulty urinating, penile pain and testicular pain.  Skin: Negative for rash.  Neurological: Negative for syncope, weakness, light-headedness, numbness and headaches.  Psychiatric/Behavioral: Negative for behavioral problems and dysphoric mood. The patient is not nervous/anxious.  Objective:   Physical Exam  Constitutional: He appears well-developed and well-nourished. Non-toxic appearance. He does not appear ill. No distress.  HENT:  Head: Normocephalic and atraumatic.  Right Ear: Hearing, tympanic membrane, external ear and ear canal normal.  Left Ear: Hearing, tympanic membrane, external ear and ear canal normal.  Nose: Nose normal.  Mouth/Throat: Uvula is midline, oropharynx is clear and moist and mucous membranes are normal.  Eyes: Conjunctivae, EOM and lids are normal. Pupils are equal, round, and reactive to light. No foreign bodies found.  Neck: Trachea normal, normal range of motion and phonation normal. Neck supple. Carotid bruit is not present. No mass and no thyromegaly present.  Cardiovascular: Normal rate, regular rhythm, S1 normal, S2 normal, intact distal pulses and normal pulses. Exam reveals no gallop.  No murmur heard.  Pulmonary/Chest: Breath sounds normal. He has no wheezes. He has no rhonchi. He has no rales.  Abdominal: Soft. Normal appearance and bowel sounds are normal. There is no hepatosplenomegaly. There is no tenderness. There is no rebound, no guarding and no CVA  tenderness. No hernia. Hernia confirmed negative in the right inguinal area and confirmed negative in the left inguinal area.  Genitourinary:  Prostate normal, testes normal and penis normal. Rectal exam shows no external hemorrhoid, no internal hemorrhoid, no fissure, no mass, no tenderness and anal tone normal. Guaiac negative stool. Prostate is mildly enlarged and not tender. Right testis shows no mass and no tenderness. Left testis shows no mass and no tenderness. No paraphimosis or penile tenderness.  Lymphadenopathy:  He has no cervical adenopathy.  Right: No inguinal adenopathy present.  Left: No inguinal adenopathy present.  Neurological: He is alert. He has normal strength and normal reflexes. No cranial nerve deficit or sensory deficit. Gait normal.  Skin: Skin is warm, dry and intact. No rash noted.  Psychiatric: He has a normal mood and affect. His speech is normal and behavior is normal. Judgment normal.  Assessment & Plan:   The patient's preventative maintenance and recommended screening tests for an annual wellness exam were reviewed in full today.  Brought up to date unless services declined.  Counselled on the importance of diet, exercise, and its role in overall health and mortality.  The patient's FH and SH was reviewed, including their home life, tobacco status, and drug and alcohol status.   Colon:2009 Dr. Deatra Ina, repeat in 10 years.  prostate: Plan PSA every other year, continue yearly rectal. Lab Results  Component Value Date   PSA 1.03 04/07/2015   PSA 0.73 04/05/2013   PSA 0.49 08/06/2010  Nonsmoker  Vaccine:Uptodate  flu.

## 2015-04-15 NOTE — Patient Instructions (Addendum)
Increase water intake overall to help with cramp.  Call if cough not continue to improve with allergy season for further eval of possible  reflux or SE to losartan.

## 2015-04-15 NOTE — Progress Notes (Signed)
Pre visit review using our clinic review tool, if applicable. No additional management support is needed unless otherwise documented below in the visit note. 

## 2015-04-19 ENCOUNTER — Other Ambulatory Visit: Payer: Self-pay | Admitting: Family Medicine

## 2015-04-23 DIAGNOSIS — D485 Neoplasm of uncertain behavior of skin: Secondary | ICD-10-CM | POA: Diagnosis not present

## 2015-04-23 DIAGNOSIS — L821 Other seborrheic keratosis: Secondary | ICD-10-CM | POA: Diagnosis not present

## 2015-04-23 DIAGNOSIS — D225 Melanocytic nevi of trunk: Secondary | ICD-10-CM | POA: Diagnosis not present

## 2015-04-23 DIAGNOSIS — X32XXXA Exposure to sunlight, initial encounter: Secondary | ICD-10-CM | POA: Diagnosis not present

## 2015-04-23 DIAGNOSIS — D0462 Carcinoma in situ of skin of left upper limb, including shoulder: Secondary | ICD-10-CM | POA: Diagnosis not present

## 2015-04-23 DIAGNOSIS — Z85828 Personal history of other malignant neoplasm of skin: Secondary | ICD-10-CM | POA: Diagnosis not present

## 2015-04-23 DIAGNOSIS — L570 Actinic keratosis: Secondary | ICD-10-CM | POA: Diagnosis not present

## 2015-04-23 DIAGNOSIS — D2261 Melanocytic nevi of right upper limb, including shoulder: Secondary | ICD-10-CM | POA: Diagnosis not present

## 2015-04-25 NOTE — Assessment & Plan Note (Signed)
At goal on no med. Encouraged  Continued exercise, weight loss, healthy eating habits.

## 2015-04-25 NOTE — Assessment & Plan Note (Signed)
Well controlled. Continue current medication.  

## 2015-04-25 NOTE — Assessment & Plan Note (Signed)
Symptomatic on finasteride and flomax, but tolerable level of symptoms.

## 2015-04-25 NOTE — Assessment & Plan Note (Signed)
Stable control. 

## 2015-05-12 DIAGNOSIS — D0462 Carcinoma in situ of skin of left upper limb, including shoulder: Secondary | ICD-10-CM | POA: Diagnosis not present

## 2015-05-12 DIAGNOSIS — D485 Neoplasm of uncertain behavior of skin: Secondary | ICD-10-CM | POA: Diagnosis not present

## 2015-05-12 DIAGNOSIS — C44622 Squamous cell carcinoma of skin of right upper limb, including shoulder: Secondary | ICD-10-CM | POA: Diagnosis not present

## 2015-05-22 ENCOUNTER — Other Ambulatory Visit: Payer: Self-pay | Admitting: Family Medicine

## 2015-06-10 ENCOUNTER — Other Ambulatory Visit: Payer: Self-pay | Admitting: Family Medicine

## 2015-08-25 ENCOUNTER — Telehealth: Payer: Self-pay | Admitting: Family Medicine

## 2015-08-25 NOTE — Telephone Encounter (Signed)
He will need to be seen before MRI can be ordered.  Please call and make him an appointment.

## 2015-08-25 NOTE — Telephone Encounter (Signed)
Patient would like a referral to get a MRI for his left knee, because he thinks he damaged it this morning doing exercices.

## 2015-09-02 ENCOUNTER — Encounter: Payer: Self-pay | Admitting: Gastroenterology

## 2015-09-04 ENCOUNTER — Ambulatory Visit: Payer: Medicare PPO | Admitting: Family Medicine

## 2015-09-10 ENCOUNTER — Ambulatory Visit (INDEPENDENT_AMBULATORY_CARE_PROVIDER_SITE_OTHER)
Admission: RE | Admit: 2015-09-10 | Discharge: 2015-09-10 | Disposition: A | Discharge status: Home / Self Care | Payer: Medicare Other | Source: Ambulatory Visit | Attending: Family Medicine | Admitting: Family Medicine

## 2015-09-10 ENCOUNTER — Ambulatory Visit (INDEPENDENT_AMBULATORY_CARE_PROVIDER_SITE_OTHER): Payer: Medicare Other | Admitting: Family Medicine

## 2015-09-10 ENCOUNTER — Encounter: Payer: Self-pay | Admitting: Family Medicine

## 2015-09-10 VITALS — BP 110/60 | HR 72 | Temp 98.6°F | Ht 71.5 in | Wt 196.5 lb

## 2015-09-10 DIAGNOSIS — M25562 Pain in left knee: Secondary | ICD-10-CM

## 2015-09-10 IMAGING — DX DG KNEE AP/LAT W/ SUNRISE*L*
4 series · 4 of 4 positions shown · non-contrast
Comparison: None.

CLINICAL DATA: Knee pain. Exercising injury.  Initial evaluation .

EXAM:
LEFT KNEE 3 VIEWS

[knee ap (1 of 2)]
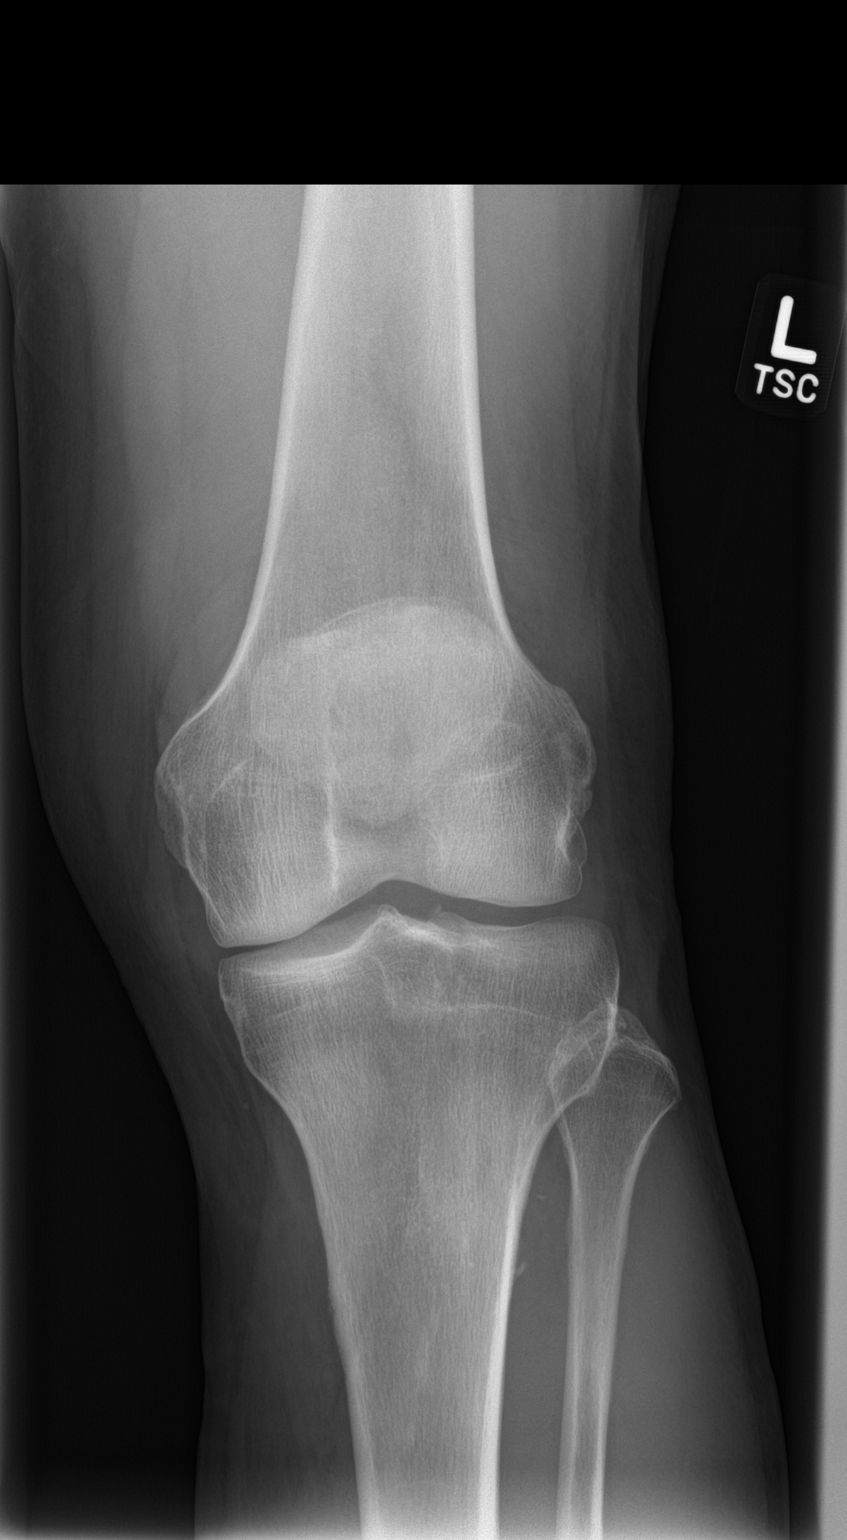

[knee lat]
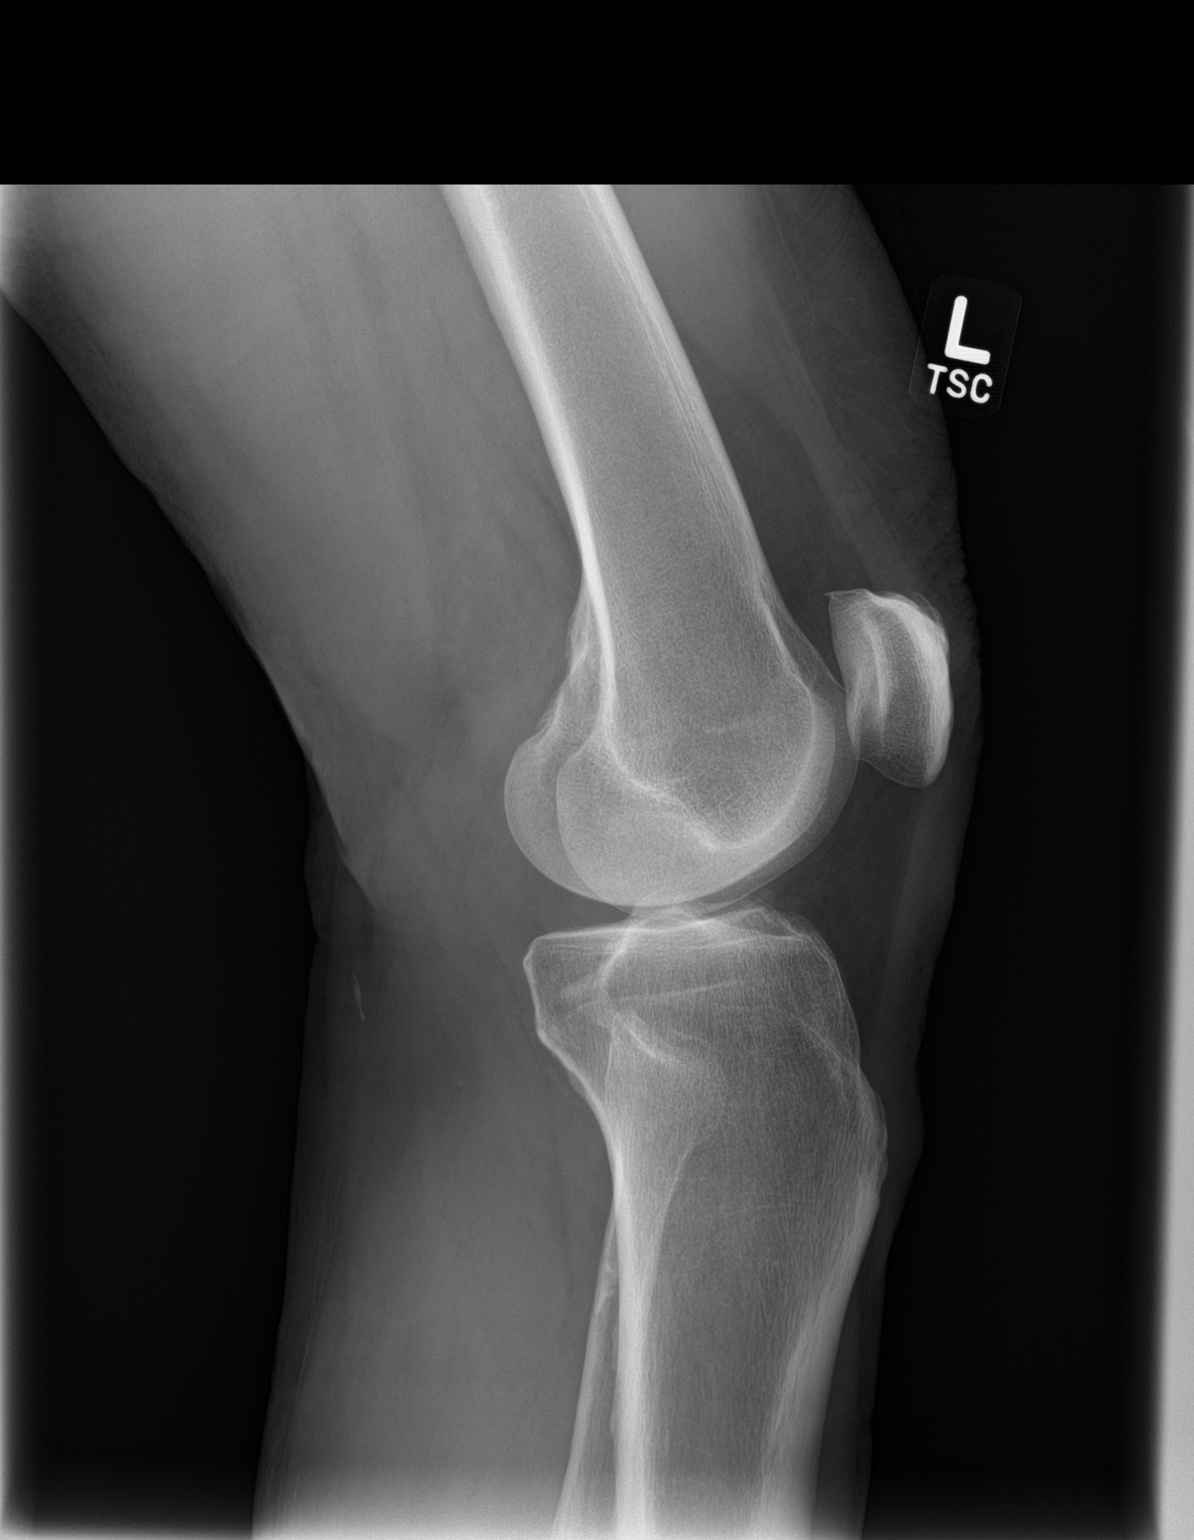

[patella skyline]
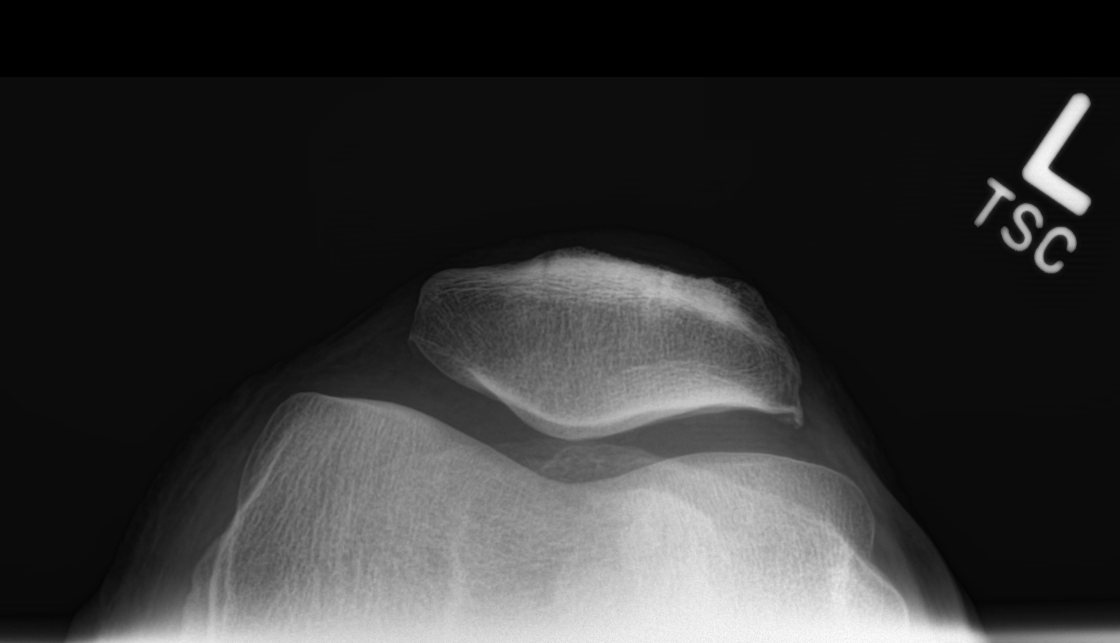

[knee ap (2 of 2)]
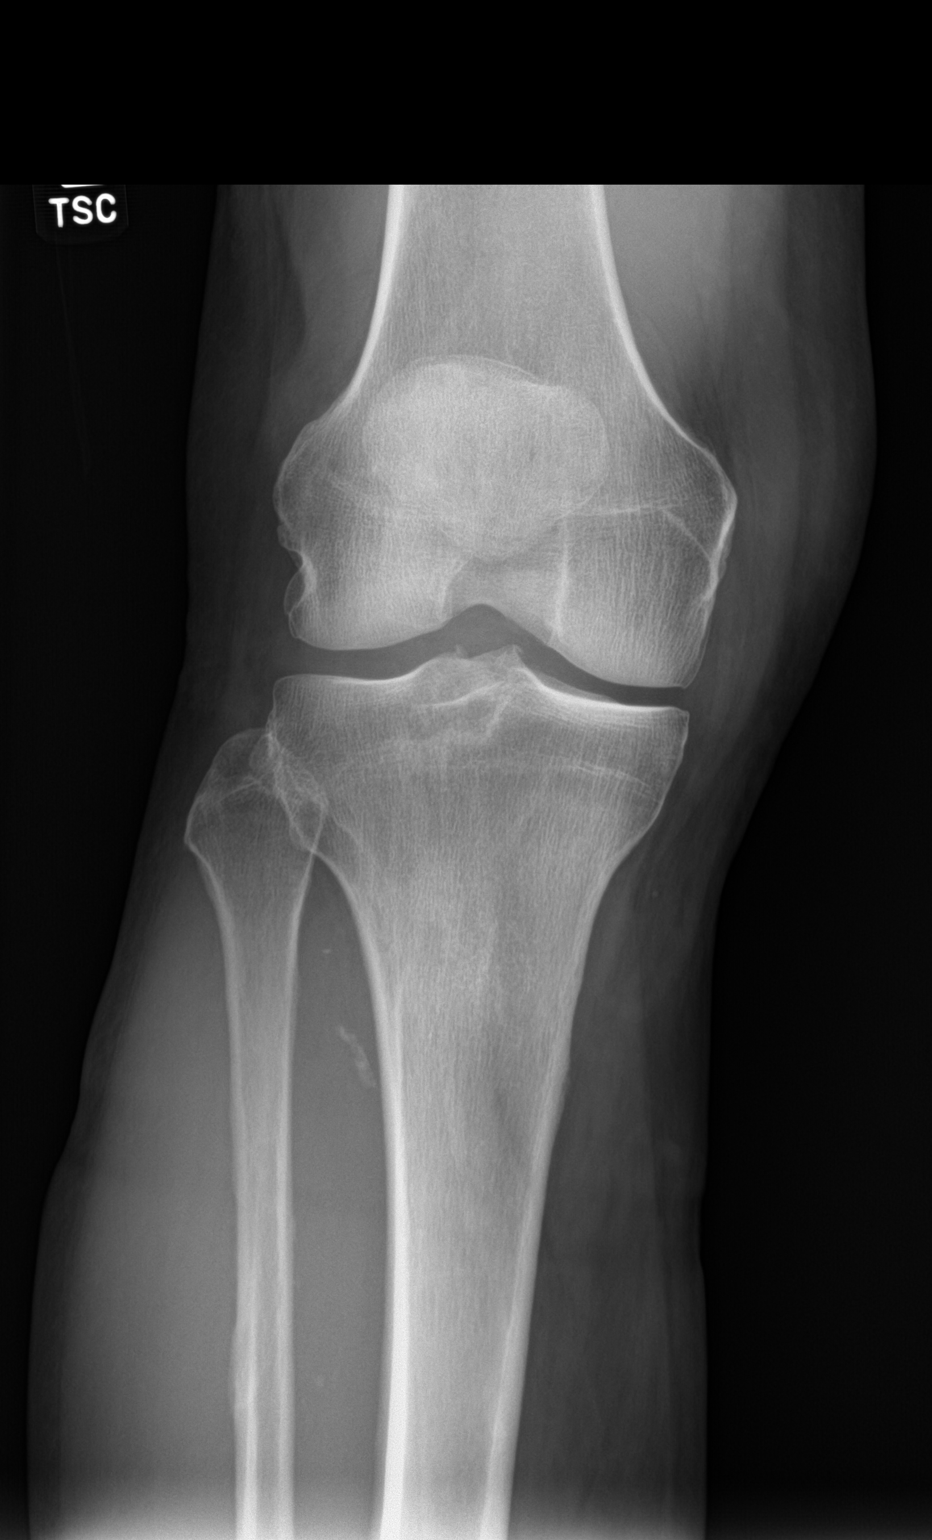

[4 of 4 positions shown; findings below may reference images not displayed]

FINDINGS: Tricompartment degenerative change. No acute bony or joint
abnormality. Tiny loose bodies. No evidence of fracture or
dislocation.
IMPRESSION: Tricompartment degenerative change with tiny loose bodies. No acute
bony abnormality identified.

## 2015-09-10 NOTE — Progress Notes (Signed)
Dr. Frederico Hamman T. Maston Wight, MD, [facility] [place], [address] Phone: [phone] Fax: [phone]  09/10/2015  Patient: Dustin Harrison, MRN: [id number], DOB: [birth year], 82 y.o.  Primary Physician:  Eliezer Lofts, MD   Chief Complaint  Patient presents with  . Knee Pain    Left-Weak for long time but worsened it exercising 3 weeks ago   Subjective:   Dustin Harrison is a 82 y.o. very pleasant male patient who presents with the following:  Exercising in the pool, MWF, and then pushed off on an exercise going to the side and has gradually gotten some better. Got some swelling on the back of the knee.   At that point, the patient injured his left knee.  He has been wearing a brace, and it is gradually been improving.  He does have some puffiness on the posterior aspect of his knee.  Prior to this he has not had any significant knee injury history.  Had to alter his golf swing.   Past Medical History, Surgical History, Social History, Family History, Problem List, Medications, and Allergies have been reviewed and updated if relevant.  Patient Active Problem List   Diagnosis Date Noted  . Counseling regarding end of life decision making 04/15/2015  . Essential hypertension, benign 08/22/2012  . PREDIABETES 08/14/2010  . HYPERCHOLESTEROLEMIA 05/12/2009  . PHEOCHROMOCYTOMA 09/07/2007  . GLAUCOMA 09/07/2007  . DUODENAL ULCER, ACUTE, HEMORRHAGE 09/07/2007  . Benign prostatic hypertrophy 09/07/2007  . OSTEOARTHRITIS 09/07/2007    Past Medical History  Diagnosis Date  . Diabetes mellitus   . Arthritis   . BPH (benign prostatic hypertrophy)   . Ulcer     Past Surgical History  Procedure Laterality Date  . Hernia repair    . Squamous cell carcinoma excision  03-2006    left ear  . Adrenaletomy    . Tonsillectomy      Social History   Social History  . Marital Status: Married    Spouse Name: N/A  . Number  of Children: N/A  . Years of Education: N/A   Occupational History  . chaplain    Social History Main Topics  . Smoking status: Never Smoker   . Smokeless tobacco: Never Used  . Alcohol Use: Yes  . Drug Use: No  . Sexual Activity: Not on file   Other Topics Concern  . Not on file   Social History Narrative   Regular exercise- yes- 3-4 times a week treadmill/walk   Diet: fruits and veggies,water   Living will, HCPOA (wife) , full code (reviewed 2)    Family History  Problem Relation Age of Onset  . Alcohol abuse Mother   . Coronary artery disease Mother   . Brain cancer Father     tumor  . Diabetes Brother     No Known Allergies  Medication list reviewed and updated in full in Dustin Harrison.  GEN: No fevers, chills. Nontoxic. Primarily MSK c/o today. MSK: Detailed in the HPI GI: tolerating PO intake without difficulty Neuro: No numbness, parasthesias, or tingling associated. Otherwise the pertinent positives of the ROS are noted above.   Objective:   BP 110/60 mmHg  Pulse 72  Temp(Src) 98.6 F (37 C) (Oral)  Ht 5' 11.5" (1.816 m)  Wt 196 lb 8 oz (89.132 kg)  BMI 27.03 kg/m2   GEN: WDWN, NAD, Non-toxic, Alert & Oriented x 3 HEENT: Atraumatic, Normocephalic.  Ears and Nose:  No external deformity. EXTR: No clubbing/cyanosis/edema NEURO: Normal gait.  PSYCH: Normally interactive. Conversant. Not depressed or anxious appearing.  Calm demeanor.   Knee:  LEFT Gait: Normal heel toe pattern ROM: 0-125 Effusion: neg Echymosis or edema: none Patellar tendon NT Painful PLICA: neg Patellar grind: + mild grind Medial and lateral patellar facet loading: negative medial and lateral joint lines:NT Mcmurray's neg Flexion-pinch neg Varus and valgus stress: stable Lachman: neg Ant and Post drawer: neg Hip abduction, IR, ER: WNL Hip flexion str: 5/5 Hip abd: 5/5 Quad: 5/5 VMO atrophy:No Hamstring concentric and eccentric: 5/5   Radiology: Dg Knee  Ap/lat W/sunrise Left  09/10/2015  CLINICAL DATA: Knee pain. Exercising injury.  Initial evaluation . EXAM: LEFT KNEE 3 VIEWS COMPARISON:  None. FINDINGS: Tricompartment degenerative change. No acute bony or joint abnormality. Tiny loose bodies. No evidence of fracture or dislocation. IMPRESSION: Tricompartment degenerative change with tiny loose bodies. No acute bony abnormality identified. Electronically Signed   By: Marcello Moores  Register   On: 09/10/2015 15:13     Assessment and Plan:   Left knee pain - Plan: DG Knee AP/LAT W/Sunrise Left  Reassuring exam, and he is getting quite a bit better clinically.  He does have some arthritic change and some small loose bodies, but as long as he is improving clinically, I wouldn't do anything further.  He can do some p.r.n. NSAIDs if he needs to.  if still symptomatic and having issues in 6 weeks, then additional evaluation would be reasonable.  Follow-up: No Follow-up on file.  Orders Placed This Encounter  Procedures  . DG Knee AP/LAT W/Sunrise Left    Signed,  Esme Durkin T. Nazier Neyhart, MD   Patient's Medications  New Prescriptions   No medications on file  Previous Medications   ASPIRIN 81 MG TABLET    Take one by mouth every other day   BIMATOPROST (LUMIGAN) 0.01 % SOLN    Place 1 drop into both eyes daily.    BRIMONIDINE (ALPHAGAN) 0.2 % OPHTHALMIC SOLUTION    TAKE 1 DROP(S) IN BOTH EYES 2 TIMES A DAY   DOCUSATE SODIUM (COLACE) 100 MG CAPSULE    Take one capsule by mouth every other day   FINASTERIDE (PROSCAR) 5 MG TABLET    TAKE 1 TABLET BY MOUTH EVERY DAY   LOSARTAN (COZAAR) 100 MG TABLET    TAKE 1 TABLET BY MOUTH DAILY.   MULTIPLE VITAMIN (MULTIVITAMIN) TABLET    Take one by mouth every other day   TAMSULOSIN (FLOMAX) 0.4 MG CAPS CAPSULE    TAKE ONE CAPSULE BY MOUTH EVERY DAY   TIMOLOL (TIMOPTIC) 0.5 % OPHTHALMIC SOLUTION    Place 1 drop into both eyes daily.   Modified Medications   No medications on file  Discontinued Medications   No  medications on file

## 2015-10-03 ENCOUNTER — Encounter: Payer: Self-pay | Admitting: *Deleted

## 2015-10-03 NOTE — Discharge Instructions (Signed)

## 2015-10-08 ENCOUNTER — Ambulatory Visit: Payer: Medicare Other | Admitting: Anesthesiology

## 2015-10-08 ENCOUNTER — Encounter
Admission: RE | Disposition: A | Discharge status: Home / Self Care | Payer: Self-pay | Source: Ambulatory Visit | Attending: Ophthalmology

## 2015-10-08 ENCOUNTER — Ambulatory Visit
Admission: RE | Admit: 2015-10-08 | Discharge: 2015-10-08 | Disposition: A | Discharge status: Home / Self Care | Payer: Medicare Other | Source: Ambulatory Visit | Attending: Ophthalmology | Admitting: Ophthalmology

## 2015-10-08 DIAGNOSIS — N400 Enlarged prostate without lower urinary tract symptoms: Secondary | ICD-10-CM | POA: Diagnosis not present

## 2015-10-08 DIAGNOSIS — H269 Unspecified cataract: Secondary | ICD-10-CM | POA: Diagnosis present

## 2015-10-08 DIAGNOSIS — Z9889 Other specified postprocedural states: Secondary | ICD-10-CM | POA: Insufficient documentation

## 2015-10-08 DIAGNOSIS — H2512 Age-related nuclear cataract, left eye: Secondary | ICD-10-CM | POA: Diagnosis not present

## 2015-10-08 DIAGNOSIS — I10 Essential (primary) hypertension: Secondary | ICD-10-CM | POA: Insufficient documentation

## 2015-10-08 DIAGNOSIS — Z79899 Other long term (current) drug therapy: Secondary | ICD-10-CM | POA: Insufficient documentation

## 2015-10-08 DIAGNOSIS — Z7982 Long term (current) use of aspirin: Secondary | ICD-10-CM | POA: Diagnosis not present

## 2015-10-08 DIAGNOSIS — Z85828 Personal history of other malignant neoplasm of skin: Secondary | ICD-10-CM | POA: Insufficient documentation

## 2015-10-08 HISTORY — DX: Unspecified hearing loss, unspecified ear: H91.90

## 2015-10-08 HISTORY — DX: Cough, unspecified: R05.9

## 2015-10-08 HISTORY — DX: Benign neoplasm of right adrenal gland: D35.01

## 2015-10-08 HISTORY — DX: Cough: R05

## 2015-10-08 HISTORY — PX: CATARACT EXTRACTION W/PHACO: SHX586

## 2015-10-08 HISTORY — DX: Presence of external hearing-aid: Z97.4

## 2015-10-08 HISTORY — DX: Essential (primary) hypertension: I10

## 2015-10-08 HISTORY — DX: Synovial cyst of popliteal space (Baker), unspecified knee: M71.20

## 2015-10-08 SURGERY — PHACOEMULSIFICATION, CATARACT, WITH IOL INSERTION
Anesthesia: Monitor Anesthesia Care | Site: Eye | Laterality: Left | Wound class: Clean

## 2015-10-08 MED ORDER — POVIDONE-IODINE 5 % OP SOLN
1.0000 "application " | OPHTHALMIC | Status: DC | PRN
  Administered 2015-10-08: 1 via OPHTHALMIC

## 2015-10-08 MED ORDER — LACTATED RINGERS IV SOLN: INTRAVENOUS | Status: DC

## 2015-10-08 MED ORDER — BRIMONIDINE TARTRATE 0.2 % OP SOLN
OPHTHALMIC | Status: DC | PRN
  Administered 2015-10-08: 1 [drp] via OPHTHALMIC

## 2015-10-08 MED ORDER — CARBACHOL 0.01 % IO SOLN
INTRAOCULAR | Status: DC | PRN
  Administered 2015-10-08: 0.5 mL via INTRAOCULAR

## 2015-10-08 MED ORDER — ACETAMINOPHEN 160 MG/5ML PO SOLN: 325.0000 mg | ORAL | Status: DC | PRN

## 2015-10-08 MED ORDER — LIDOCAINE HCL (PF) 4 % IJ SOLN
INTRAMUSCULAR | Status: DC | PRN
  Administered 2015-10-08: 1 mL via OPHTHALMIC

## 2015-10-08 MED ORDER — TIMOLOL MALEATE 0.5 % OP SOLN
OPHTHALMIC | Status: DC | PRN
  Administered 2015-10-08: 1 [drp] via OPHTHALMIC

## 2015-10-08 MED ORDER — BSS IO SOLN
INTRAOCULAR | Status: DC | PRN
  Administered 2015-10-08: 82 mL

## 2015-10-08 MED ORDER — CEFUROXIME OPHTHALMIC INJECTION 1 MG/0.1 ML
INJECTION | OPHTHALMIC | Status: DC | PRN
  Administered 2015-10-08: 0.1 mL via INTRACAMERAL

## 2015-10-08 MED ORDER — FENTANYL CITRATE (PF) 100 MCG/2ML IJ SOLN
INTRAMUSCULAR | Status: DC | PRN
  Administered 2015-10-08: 100 ug via INTRAVENOUS

## 2015-10-08 MED ORDER — ARMC OPHTHALMIC DILATING GEL
1.0000 "application " | OPHTHALMIC | Status: DC | PRN
  Administered 2015-10-08 (×2): 1 via OPHTHALMIC

## 2015-10-08 MED ORDER — MIDAZOLAM HCL 2 MG/2ML IJ SOLN
INTRAMUSCULAR | Status: DC | PRN
  Administered 2015-10-08: 2 mg via INTRAVENOUS

## 2015-10-08 MED ORDER — ACETAMINOPHEN 325 MG PO TABS: 325.0000 mg | ORAL_TABLET | ORAL | Status: DC | PRN

## 2015-10-08 MED ORDER — NA HYALUR & NA CHOND-NA HYALUR 0.4-0.35 ML IO KIT
PACK | INTRAOCULAR | Status: DC | PRN
  Administered 2015-10-08: 1 mL via INTRAOCULAR

## 2015-10-08 MED ORDER — TETRACAINE HCL 0.5 % OP SOLN
1.0000 [drp] | OPHTHALMIC | Status: DC | PRN
  Administered 2015-10-08: 1 [drp] via OPHTHALMIC

## 2015-10-08 SURGICAL SUPPLY — 22 items
CANNULA ANT/CHMB 27GA (MISCELLANEOUS) ×3 IMPLANT
CARTRIDGE ABBOTT (MISCELLANEOUS) IMPLANT
GLOVE SURG LX 7.5 STRW (GLOVE) ×2
GLOVE SURG LX STRL 7.5 STRW (GLOVE) ×1 IMPLANT
GLOVE SURG TRIUMPH 8.0 PF LTX (GLOVE) ×3 IMPLANT
GOWN STRL REUS W/ TWL LRG LVL3 (GOWN DISPOSABLE) ×2 IMPLANT
GOWN STRL REUS W/TWL LRG LVL3 (GOWN DISPOSABLE) ×4
LENS IOL TECNIS ITEC 20.5 (Intraocular Lens) ×3 IMPLANT
MARKER SKIN DUAL TIP RULER LAB (MISCELLANEOUS) ×3 IMPLANT
NDL RETROBULBAR .5 NSTRL (NEEDLE) IMPLANT
PACK CATARACT BRASINGTON (MISCELLANEOUS) ×3 IMPLANT
PACK EYE AFTER SURG (MISCELLANEOUS) ×3 IMPLANT
PACK OPTHALMIC (MISCELLANEOUS) ×3 IMPLANT
RING MALYGIN 7.0 (MISCELLANEOUS) IMPLANT
SUT ETHILON 10-0 CS-B-6CS-B-6 (SUTURE)
SUT VICRYL  9 0 (SUTURE)
SUT VICRYL 9 0 (SUTURE) IMPLANT
SUTURE EHLN 10-0 CS-B-6CS-B-6 (SUTURE) IMPLANT
SYR 3ML LL SCALE MARK (SYRINGE) ×3 IMPLANT
SYR TB 1ML LUER SLIP (SYRINGE) ×3 IMPLANT
WATER STERILE IRR 250ML POUR (IV SOLUTION) ×3 IMPLANT
WIPE NON LINTING 3.25X3.25 (MISCELLANEOUS) ×3 IMPLANT

## 2015-10-08 NOTE — Transfer of Care (Signed)
Immediate Anesthesia Transfer of Care Note  Patient: Dustin Harrison  Procedure(s) Performed: Procedure(s) with comments: CATARACT EXTRACTION PHACO AND INTRAOCULAR LENS PLACEMENT ([place]) left eye (Left) - MALYUGIN  Patient Location: PACU  Anesthesia Type: MAC  Level of Consciousness: awake, alert  and patient cooperative  Airway and Oxygen Therapy: Patient Spontanous Breathing and Patient connected to supplemental oxygen  Post-op Assessment: Post-op Vital signs reviewed, Patient's Cardiovascular Status Stable, Respiratory Function Stable, Patent Airway and No signs of Nausea or vomiting  Post-op Vital Signs: Reviewed and stable  Complications: No apparent anesthesia complications

## 2015-10-08 NOTE — Op Note (Signed)
OPERATIVE NOTE  Dustin Harrison [id number] 10/08/2015   PREOPERATIVE DIAGNOSIS:  Nuclear sclerotic cataract left eye. H25.12   POSTOPERATIVE DIAGNOSIS:    Nuclear sclerotic cataract left eye.     PROCEDURE:  Phacoemusification with posterior chamber intraocular lens placement of the left eye   LENS:   Implant Name Type Inv. Item Serial No. Manufacturer Lot No. LRB No. Used  LENS IOL DIOP 20.5 - [id number] Intraocular Lens LENS IOL DIOP 20.5 [id number] AMO   Left 1        ULTRASOUND TIME: 15  % of 1 minutes 24 seconds, CDE 12.9  SURGEON:  Wyonia Hough, MD   ANESTHESIA:  Topical with tetracaine drops and 2% Xylocaine jelly, augmented with 1% preservative-free intracameral lidocaine.    COMPLICATIONS:  None.   DESCRIPTION OF PROCEDURE:  The patient was identified in the holding room and transported to the operating room and placed in the supine position under the operating microscope.  The left eye was identified as the operative eye and it was prepped and draped in the usual sterile ophthalmic fashion.   A 1 millimeter clear-corneal paracentesis was made at the 1:30 position.  0.5 ml of preservative-free 1% lidocaine was injected into the anterior chamber.  The anterior chamber was filled with Viscoat viscoelastic.  A 2.4 millimeter keratome was used to make a near-clear corneal incision at the 10:30 position.  .  A curvilinear capsulorrhexis was made with a cystotome and capsulorrhexis forceps.  Balanced salt solution was used to hydrodissect and hydrodelineate the nucleus.   Phacoemulsification was then used in stop and chop fashion to remove the lens nucleus and epinucleus.  The remaining cortex was then removed using the irrigation and aspiration handpiece. Provisc was then placed into the capsular bag to distend it for lens placement.  A lens was then injected into the capsular bag.  The remaining viscoelastic was aspirated.   Wounds were hydrated with balanced salt  solution.  The anterior chamber was inflated to a physiologic pressure with balanced salt solution.  Miostat was placed into the anterior chamber.  No wound leaks were noted. Cefuroxime 0.1 ml of a 10mg /ml solution was injected into the anterior chamber for a dose of 1 mg of intracameral antibiotic at the completion of the case.   Timolol and Brimonidine drops were applied to the eye.  The patient was taken to the recovery room in stable condition without complications of anesthesia or surgery.  Dustin Harrison 10/08/2015, 11:22 AM

## 2015-10-08 NOTE — Anesthesia Preprocedure Evaluation (Signed)
Anesthesia Evaluation  Patient identified by MRN, date of birth, ID band  Reviewed: Allergy & Precautions, H&P , NPO status , Patient's Chart, lab work & pertinent test results  Airway Mallampati: II  TM Distance: >3 FB Neck ROM: full    Dental no notable dental hx.    Pulmonary    Pulmonary exam normal        Cardiovascular hypertension,  Rhythm:regular Rate:Normal     Neuro/Psych    GI/Hepatic PUD,   Endo/Other  diabetes  Renal/GU      Musculoskeletal   Abdominal   Peds  Hematology   Anesthesia Other Findings   Reproductive/Obstetrics                             Anesthesia Physical Anesthesia Plan  ASA: II  Anesthesia Plan: MAC   Post-op Pain Management:    Induction:   Airway Management Planned:   Additional Equipment:   Intra-op Plan:   Post-operative Plan:   Informed Consent: I have reviewed the patients History and Physical, chart, labs and discussed the procedure including the risks, benefits and alternatives for the proposed anesthesia with the patient or authorized representative who has indicated his/her understanding and acceptance.     Plan Discussed with: CRNA  Anesthesia Plan Comments:         Anesthesia Quick Evaluation

## 2015-10-08 NOTE — Anesthesia Postprocedure Evaluation (Signed)
Anesthesia Post Note  Patient: Dustin Harrison  Procedure(s) Performed: Procedure(s) (LRB): CATARACT EXTRACTION PHACO AND INTRAOCULAR LENS PLACEMENT ([place]) left eye (Left)  Patient location during evaluation: PACU Anesthesia Type: MAC Level of consciousness: awake and alert and oriented Pain management: satisfactory to patient Vital Signs Assessment: post-procedure vital signs reviewed and stable Respiratory status: spontaneous breathing, nonlabored ventilation and respiratory function stable Cardiovascular status: blood pressure returned to baseline and stable Postop Assessment: Adequate PO intake and No signs of nausea or vomiting Anesthetic complications: no    Raliegh Ip

## 2015-10-08 NOTE — H&P (Signed)
  The History and Physical notes are on paper, have been signed, and are to be scanned. The patient remains stable and unchanged from the H&P.   Previous H&P reviewed, patient examined, and there are no changes.  Dustin Harrison 10/08/2015 10:21 AM

## 2015-10-08 NOTE — Anesthesia Procedure Notes (Signed)
Procedure Name: MAC Performed by: Candler Ginsberg Pre-anesthesia Checklist: Patient identified, Emergency Drugs available, Suction available, Timeout performed and Patient being monitored Patient Re-evaluated:Patient Re-evaluated prior to inductionOxygen Delivery Method: Nasal cannula Placement Confirmation: positive ETCO2     

## 2015-10-09 ENCOUNTER — Encounter: Payer: Self-pay | Admitting: Ophthalmology

## 2015-10-28 ENCOUNTER — Encounter: Payer: Self-pay | Admitting: Gastroenterology

## 2015-10-28 ENCOUNTER — Ambulatory Visit (INDEPENDENT_AMBULATORY_CARE_PROVIDER_SITE_OTHER): Payer: Medicare Other | Admitting: Gastroenterology

## 2015-10-28 VITALS — BP 130/80 | HR 51 | Ht 72.0 in | Wt 199.0 lb

## 2015-10-28 DIAGNOSIS — R1310 Dysphagia, unspecified: Secondary | ICD-10-CM

## 2015-10-28 NOTE — Progress Notes (Signed)
[facility] Consult Note:  History: Dustin Harrison 10/28/2015  Referring physician: Eliezer Lofts, MD  Reason for consult/chief complaint: Dysphagia   Subjective HPI:  This is an 82 year old man referred for about a year of dysphagia. It occurs with solid food and often with the knots that he puts in his morning oatmeal. It also sometimes occurs with pills, liquids or able to pass without trouble. Things feel stuck in the upper to mid chest and will eventually pass if he consumes more liquids. He also has a chronic cough and wonders if it may be related to the dysphagia. His appetite is been good and his weight stable.  ROS:  Review of Systems  Constitutional: Negative for appetite change and unexpected weight change.  HENT: Negative for mouth sores and voice change.   Eyes: Negative for pain and redness.  Respiratory: Negative for cough and shortness of breath.   Cardiovascular: Negative for chest pain and palpitations.  Genitourinary: Negative for dysuria and hematuria.  Musculoskeletal: Positive for arthralgias. Negative for myalgias.  Skin: Negative for pallor and rash.  Neurological: Negative for weakness and headaches.  Hematological: Negative for adenopathy.    he is hard of hearing Past Medical History: Past Medical History  Diagnosis Date  . Diabetes mellitus   . Arthritis   . BPH (benign prostatic hypertrophy)   . Ulcer   . HOH (hard of hearing)   . Wears hearing aid     bilateral  . Cough     because of "tight" esophagus  . Hypertension   . Baker's cyst of knee     left  . Pheochromocytoma of right adrenal gland   . Abnormal glucose      Past Surgical History: Past Surgical History  Procedure Laterality Date  . Hernia repair    . Squamous cell carcinoma excision  03-2006    left ear  . Adrenaletomy    . Tonsillectomy    . Cataract extraction w/phaco Left 10/08/2015    Procedure: CATARACT EXTRACTION PHACO AND INTRAOCULAR LENS  PLACEMENT (IOC) left eye;  Surgeon: Leandrew Koyanagi, MD;  Location: [facility];  Service: Ophthalmology;  Laterality: Left;  MALYUGIN     Family History: Family History  Problem Relation Age of Onset  . Alcohol abuse Mother   . Coronary artery disease Mother   . Brain cancer Father     tumor  . Diabetes Brother     Social History: Social History   Social History  . Marital Status: Married    Spouse Name: N/A  . Number of Children: N/A  . Years of Education: N/A   Occupational History  . chaplain    Social History Main Topics  . Smoking status: Never Smoker   . Smokeless tobacco: Never Used  . Alcohol Use: 4.2 oz/week    7 Glasses of wine per week  . Drug Use: No  . Sexual Activity: Not Asked   Other Topics Concern  . None   Social History Narrative   Regular exercise- yes- 3-4 times a week treadmill/walk   Diet: fruits and veggies,water   Living will, HCPOA (wife) , full code (reviewed 2015)  he is quite active and frequent travels.  Allergies: No Known Allergies  Outpatient Meds: Current Outpatient Prescriptions  Medication Sig Dispense Refill  . aspirin 81 MG tablet Take one by mouth every other day    . bimatoprost (LUMIGAN) 0.01 % SOLN Place 1 drop into both eyes daily.     Marland Kitchen  brimonidine (ALPHAGAN) 0.2 % ophthalmic solution TAKE 1 DROP(S) IN BOTH EYES 2 TIMES A DAY  3  . Difluprednate (DUREZOL OP) Apply to eye.    . docusate sodium (COLACE) 100 MG capsule Take one capsule by mouth every other day    . finasteride (PROSCAR) 5 MG tablet TAKE 1 TABLET BY MOUTH EVERY DAY 90 tablet 3  . ketorolac (ACULAR) 0.5 % ophthalmic solution 1 drop 4 (four) times daily.    Marland Kitchen losartan (COZAAR) 100 MG tablet TAKE 1 TABLET BY MOUTH DAILY. 90 tablet 1  . Multiple Vitamin (MULTIVITAMIN) tablet Take one by mouth every other day    . tamsulosin (FLOMAX) 0.4 MG CAPS capsule TAKE ONE CAPSULE BY MOUTH EVERY DAY 90 capsule 1  . timolol (TIMOPTIC) 0.5 % ophthalmic  solution Place 1 drop into both eyes 2 (two) times daily.      No current facility-administered medications for this visit.      ___________________________________________________________________ Objective  Exam:  BP 130/80 mmHg  Pulse 51  Ht 6' (1.829 m)  Wt 199 lb (90.266 kg)  BMI 26.98 kg/m2   General: this is a(n) ell-appearing elderly man with good muscle mass   Eyes: sclera anicteric, no redness  ENT: oral mucosa moist without lesions, no cervical or supraclavicular lymphadenopathy, good dentition  CV: RRR without murmur, S1/S2, no JVD, no peripheral edema  Resp: clear to auscultation bilaterally, normal RR and effort noted  GI: soft, no tenderness, with active bowel sounds. No guarding or palpable organomegaly noted.  Skin; warm and dry, no rash or jaundice noted  Neuro: awake, alert and oriented x 3. Normal gross motor function and fluent speech.  Decreased hearing acuity   He reports no previous workup, and no barium swallow reports are found in the Epic system  Assessment: Encounter Diagnosis  Name Primary?  Marland Kitchen Dysphagia Yes    It sounds likely benign such as a ring or stricture, possibly motility disorder.  Plan:  EGD with possible dilation.  The benefits and risks of the planned procedure were described in detail with the patient or (when appropriate) their health care proxy.  Risks were outlined as including, but not limited to, bleeding, infection, perforation, adverse medication reaction leading to cardiac or pulmonary decompensation, or pancreatitis (if ERCP).  The limitation of incomplete mucosal visualization was also discussed.  No guarantees or warranties were given.   Thank you for the courtesy of this consult.  Please call me with any questions or concerns.  Nelida Meuse III  CC: Eliezer Lofts, MD

## 2015-10-28 NOTE — Patient Instructions (Signed)
If you are age 80 or older, your body mass index should be between 23-30. Your Body mass index is 26.98 kg/(m^2). If this is out of the aforementioned range listed, please consider follow up with your Primary Care Provider.  If you are age 24 or younger, your body mass index should be between 19-25. Your Body mass index is 26.98 kg/(m^2). If this is out of the aformentioned range listed, please consider follow up with your Primary Care Provider.   You have been scheduled for a colonoscopy. Please follow written instructions given to you at your visit today.  Please pick up your prep supplies at the pharmacy within the next 1-3 days. If you use inhalers (even only as needed), please bring them with you on the day of your procedure. Your physician has requested that you go to www.startemmi.com and enter the access code given to you at your visit today. This web site gives a general overview about your procedure. However, you should still follow specific instructions given to you by our office regarding your preparation for the procedure.  Thank you for choosing [facility]  Dr Wilfrid Lund III

## 2015-11-14 ENCOUNTER — Ambulatory Visit (AMBULATORY_SURGERY_CENTER): Payer: Medicare Other | Admitting: Gastroenterology

## 2015-11-14 ENCOUNTER — Encounter: Payer: Self-pay | Admitting: Gastroenterology

## 2015-11-14 VITALS — BP 134/67 | HR 52 | Temp 96.8°F | Resp 8 | Ht 72.0 in | Wt 199.0 lb

## 2015-11-14 DIAGNOSIS — R1310 Dysphagia, unspecified: Secondary | ICD-10-CM

## 2015-11-14 MED ORDER — SODIUM CHLORIDE 0.9 % IV SOLN: 500.0000 mL | INTRAVENOUS | Status: DC

## 2015-11-14 NOTE — Progress Notes (Signed)
Patient awakening, vss report to rn 

## 2015-11-14 NOTE — Op Note (Signed)
[facility] Patient Name: Dustin Harrison Procedure Date: 11/14/2015 9:44 AM MRN: [id number] Endoscopist: Mallie Mussel L. Loletha Carrow , MD Age: 82 Referring MD:  Date of Birth: 02-26-1932 Gender: Male Procedure:                Upper GI endoscopy Indications:              Dysphagia Medicines:                Monitored Anesthesia Care Procedure:                Pre-Anesthesia Assessment:                           - Prior to the procedure, a History and Physical                            was performed, and patient medications and                            allergies were reviewed. The patient's tolerance of                            previous anesthesia was also reviewed. The risks                            and benefits of the procedure and the sedation                            options and risks were discussed with the patient.                            All questions were answered, and informed consent                            was obtained. Prior Anticoagulants: The patient has                            taken no previous anticoagulant or antiplatelet                            agents. ASA Grade Assessment: II - A patient with                            mild systemic disease. After reviewing the risks                            and benefits, the patient was deemed in                            satisfactory condition to undergo the procedure.                           After obtaining informed consent, the endoscope was  passed under direct vision. Throughout the                            procedure, the patient's blood pressure, pulse, and                            oxygen saturations were monitored continuously. The                            Model GIF-HQ190 [id number]) scope was introduced                            through the mouth, and advanced to the second part                            of duodenum. The upper GI endoscopy was   accomplished without difficulty. The patient                            tolerated the procedure well. Scope In: Scope Out: Findings:                 The esophagus was normal. Specifically, there were                            no rings or strictures. There was no dilatation or                            hiatal hernia.                           The stomach was normal.                           The cardia and gastric fundus were normal on                            retroflexion.                           The examined duodenum was normal.                           The larynx was normal. Complications:            No immediate complications. Estimated Blood Loss:     Estimated blood loss: none. Impression:               - Normal esophagus.                           - Normal stomach.                           - Normal examined duodenum.                           - Normal larynx.                           -  No specimens collected.                           Most likely age-related presby-esophagus. Recommendation:           - Patient has a contact number available for                            emergencies. The signs and symptoms of potential                            delayed complications were discussed with the                            patient. Return to normal activities tomorrow.                            Written discharge instructions were provided to the                            patient.                           - Resume previous diet.                           - Continue present medications.                           - If symptoms restrict food intake to the point of                            weight loss, return to GI clinic to schedule                            esophageal motility study. Henry L. Loletha Carrow, MD 11/14/2015 10:11:11 AM This report has been signed electronically.

## 2015-11-14 NOTE — Patient Instructions (Signed)
YOU HAD AN ENDOSCOPIC PROCEDURE TODAY AT THE [facility]:   Refer to the procedure report that was given to you for any specific questions about what was found during the examination.  If the procedure report does not answer your questions, please call your gastroenterologist to clarify.  If you requested that your care partner not be given the details of your procedure findings, then the procedure report has been included in a sealed envelope for you to review at your convenience later.  YOU SHOULD EXPECT: Some feelings of bloating in the abdomen. Passage of more gas than usual.  Walking can help get rid of the air that was put into your GI tract during the procedure and reduce the bloating. If you had a lower endoscopy (such as a colonoscopy or flexible sigmoidoscopy) you may notice spotting of blood in your stool or on the toilet paper. If you underwent a bowel prep for your procedure, you may not have a normal bowel movement for a few days.  Please Note:  You might notice some irritation and congestion in your nose or some drainage.  This is from the oxygen used during your procedure.  There is no need for concern and it should clear up in a day or so.  SYMPTOMS TO REPORT IMMEDIATELY:    Following upper endoscopy (EGD)  Vomiting of blood or coffee ground material  New chest pain or pain under the shoulder blades  Painful or persistently difficult swallowing  New shortness of breath  Fever of 100F or higher  Black, tarry-looking stools  For urgent or emergent issues, a gastroenterologist can be reached at any hour by calling [phone].   DIET: Your first meal following the procedure should be a small meal and then it is ok to progress to your normal diet. Heavy or fried foods are harder to digest and may make you feel nauseous or bloated.  Likewise, meals heavy in dairy and vegetables can increase bloating.  Drink plenty of fluids but you should avoid alcoholic beverages  for 24 hours.  ACTIVITY:  You should plan to take it easy for the rest of today and you should NOT DRIVE or use heavy machinery until tomorrow (because of the sedation medicines used during the test).    FOLLOW UP: Our staff will call the number listed on your records the next business day following your procedure to check on you and address any questions or concerns that you may have regarding the information given to you following your procedure. If we do not reach you, we will leave a message.  However, if you are feeling well and you are not experiencing any problems, there is no need to return our call.  We will assume that you have returned to your regular daily activities without incident.  If any biopsies were taken you will be contacted by phone or by letter within the next 1-3 weeks.  Please call us at [phone] if you have not heard about the biopsies in 3 weeks.    SIGNATURES/CONFIDENTIALITY: You and/or your care partner have signed paperwork which will be entered into your electronic medical record.  These signatures attest to the fact that that the information above on your After Visit Summary has been reviewed and is understood.  Full responsibility of the confidentiality of this discharge information lies with you and/or your care-partner. 

## 2015-11-17 ENCOUNTER — Telehealth: Payer: Self-pay

## 2015-11-17 NOTE — Telephone Encounter (Signed)
  Follow up Call-  Call back number 11/14/2015  Post procedure Call Back phone  # [phone]  Permission to leave phone message Yes     Patient was called for follow up after his procedure on 11/14/2015. No answer at the number given for follow up phone call. A message was left on the answering machine.

## 2015-11-21 ENCOUNTER — Other Ambulatory Visit: Payer: Self-pay | Admitting: Family Medicine

## 2016-01-08 ENCOUNTER — Other Ambulatory Visit: Payer: Self-pay | Admitting: Family Medicine

## 2016-01-12 ENCOUNTER — Other Ambulatory Visit: Payer: Self-pay

## 2016-01-12 MED ORDER — LOSARTAN POTASSIUM 100 MG PO TABS: 100.0000 mg | ORAL_TABLET | Freq: Every day | ORAL | 0 refills | Status: DC

## 2016-01-12 NOTE — Telephone Encounter (Signed)
Terri with [facility] left v/m requesting refill Losartan; last filled # 90 x 1 on 04/20/15. Last annual 04/15/15 and upcoming CPX on 05/06/16. Refilled per protocol.

## 2016-01-29 ENCOUNTER — Telehealth: Payer: Self-pay

## 2016-01-29 MED ORDER — ACETAZOLAMIDE 125 MG PO TABS: 125.0000 mg | ORAL_TABLET | Freq: Two times a day (BID) | ORAL | 0 refills | Status: DC

## 2016-01-29 NOTE — Telephone Encounter (Signed)
Please fax in rx as prescription instructions to [facility] to send in on computer.

## 2016-01-29 NOTE — Telephone Encounter (Signed)
Rx faxed to [facility] [phone].

## 2016-01-29 NOTE — Telephone Encounter (Addendum)
Pt left v/m requesting Acetazolamide for altitude sickness. Do not see on current or hx med list. Pt last annual 04/15/2015.Please advise.Unable to reach pt to verify which pharmacy. Pt called back and spoke with Melissa at front desk. Pt uses [facility].

## 2016-04-10 ENCOUNTER — Other Ambulatory Visit: Payer: Self-pay | Admitting: Family Medicine

## 2016-04-13 ENCOUNTER — Telehealth: Payer: Self-pay | Admitting: Family Medicine

## 2016-04-13 DIAGNOSIS — Z125 Encounter for screening for malignant neoplasm of prostate: Secondary | ICD-10-CM

## 2016-04-13 DIAGNOSIS — E7800 Pure hypercholesterolemia, unspecified: Secondary | ICD-10-CM

## 2016-04-13 DIAGNOSIS — R7309 Other abnormal glucose: Secondary | ICD-10-CM

## 2016-04-13 NOTE — Telephone Encounter (Signed)
-----   Message from Inocencio Homes, [place] sent at 04/09/2016 11:08 AM EDT ----- Regarding: Lab orders for friday 04/16/16.  Lab orders for CPX please. Thank you.

## 2016-04-16 ENCOUNTER — Other Ambulatory Visit (INDEPENDENT_AMBULATORY_CARE_PROVIDER_SITE_OTHER): Payer: Medicare Other

## 2016-04-16 DIAGNOSIS — E7800 Pure hypercholesterolemia, unspecified: Secondary | ICD-10-CM

## 2016-04-16 DIAGNOSIS — Z125 Encounter for screening for malignant neoplasm of prostate: Secondary | ICD-10-CM

## 2016-04-16 DIAGNOSIS — R7309 Other abnormal glucose: Secondary | ICD-10-CM

## 2016-04-16 LAB — LIPID PANEL
CHOL/HDL RATIO: 2
CHOLESTEROL: 251 mg/dL — AB (ref 0–200)
HDL: 101.2 mg/dL (ref >39.00)
LDL Cholesterol: 140 mg/dL — ABNORMAL HIGH (ref 0–99)
NonHDL: 149.63
TRIGLYCERIDES: 50 mg/dL (ref 0.0–149.0)
VLDL: 10 mg/dL (ref 0.0–40.0)

## 2016-04-16 LAB — COMPREHENSIVE METABOLIC PANEL
ALBUMIN: 4.4 g/dL (ref 3.5–5.2)
ALT: 34 U/L (ref 0–53)
AST: 28 U/L (ref 0–37)
Alkaline Phosphatase: 77 U/L (ref 39–117)
BILIRUBIN TOTAL: 0.5 mg/dL (ref 0.2–1.2)
BUN: 24 mg/dL — ABNORMAL HIGH (ref 6–23)
CALCIUM: 10 mg/dL (ref 8.4–10.5)
CHLORIDE: 103 meq/L (ref 96–112)
CO2: 30 meq/L (ref 19–32)
CREATININE: 1.35 mg/dL (ref 0.40–1.50)
GFR: 53.74 mL/min — AB (ref >60.00)
Glucose, Bld: 120 mg/dL — ABNORMAL HIGH (ref 70–99)
Potassium: 4.9 mEq/L (ref 3.5–5.1)
Sodium: 141 mEq/L (ref 135–145)
Total Protein: 7 g/dL (ref 6.0–8.3)

## 2016-04-16 LAB — HEMOGLOBIN A1C: Hgb A1c MFr Bld: 6.4 % (ref 4.6–6.5)

## 2016-04-16 LAB — PSA, MEDICARE: PSA: 0.91 ng/mL (ref 0.10–4.00)

## 2016-04-23 ENCOUNTER — Encounter: Payer: Self-pay | Admitting: Family Medicine

## 2016-04-23 ENCOUNTER — Ambulatory Visit (INDEPENDENT_AMBULATORY_CARE_PROVIDER_SITE_OTHER): Payer: Medicare Other | Admitting: Family Medicine

## 2016-04-23 VITALS — BP 110/60 | HR 57 | Temp 98.3°F | Ht 71.5 in | Wt 197.5 lb

## 2016-04-23 DIAGNOSIS — I10 Essential (primary) hypertension: Secondary | ICD-10-CM | POA: Diagnosis not present

## 2016-04-23 DIAGNOSIS — E7800 Pure hypercholesterolemia, unspecified: Secondary | ICD-10-CM | POA: Diagnosis not present

## 2016-04-23 DIAGNOSIS — H9190 Unspecified hearing loss, unspecified ear: Secondary | ICD-10-CM | POA: Insufficient documentation

## 2016-04-23 DIAGNOSIS — N182 Chronic kidney disease, stage 2 (mild): Secondary | ICD-10-CM

## 2016-04-23 DIAGNOSIS — H903 Sensorineural hearing loss, bilateral: Secondary | ICD-10-CM

## 2016-04-23 DIAGNOSIS — Z0000 Encounter for general adult medical examination without abnormal findings: Secondary | ICD-10-CM | POA: Diagnosis not present

## 2016-04-23 DIAGNOSIS — N1832 Chronic kidney disease, stage 3b: Secondary | ICD-10-CM | POA: Insufficient documentation

## 2016-04-23 DIAGNOSIS — R7309 Other abnormal glucose: Secondary | ICD-10-CM

## 2016-04-23 DIAGNOSIS — N401 Enlarged prostate with lower urinary tract symptoms: Secondary | ICD-10-CM

## 2016-04-23 DIAGNOSIS — N183 Chronic kidney disease, stage 3 unspecified: Secondary | ICD-10-CM | POA: Insufficient documentation

## 2016-04-23 NOTE — Patient Instructions (Addendum)
Work  Low cholesterol diet low carb diet. Keep up the exercise.  Use tylenol instead of ibuprofen if you can for pain.

## 2016-04-23 NOTE — Assessment & Plan Note (Signed)
Well controlled. Continue current medication.  

## 2016-04-23 NOTE — Assessment & Plan Note (Signed)
Work on avoiding dehydration, avoid NSAIDs

## 2016-04-23 NOTE — Assessment & Plan Note (Signed)
Work on low [employer].

## 2016-04-23 NOTE — Progress Notes (Signed)
Pre visit review using our clinic review tool, if applicable. No additional management support is needed unless otherwise documented below in the visit note. 

## 2016-04-23 NOTE — Assessment & Plan Note (Signed)
Stable on current medications 

## 2016-04-23 NOTE — Progress Notes (Signed)
Subjective:    Patient ID: Dustin Harrison, male    DOB: 25-Aug-1931, 82 y.o.   MRN: [id number]  HPI  I have personally reviewed the Medicare Annual Wellness questionnaire and have noted 1. The patient's medical and social history 2. Their use of alcohol, tobacco or illicit drugs 3. Their current medications and supplements 4. The patient's functional ability including ADL's, fall risks, home safety risks and hearing or visual             impairment. 5. Diet and physical activities 6. Evidence for depression or mood disorders 7.         Updated provider list Cognitive evaluation was performed and recorded on pt medicare questionnaire form. The patients weight, height, BMI and visual acuity have been recorded in the chart  I have made referrals, counseling and provided education to the patient based review of the above and I have provided the pt with a written personalized care plan for preventive services.    Elevated Cholesterol: Worsened control on no med but  Given age.. Not treating aggressively. No hx of CAD. Lab Results  Component Value Date   CHOL 251 (H) 04/16/2016   HDL 101.20 04/16/2016   LDLCALC 140 (H) 04/16/2016   LDLDIRECT 126.2 04/05/2013   TRIG 50.0 04/16/2016   CHOLHDL 2 04/16/2016  Using medications without problems: Muscle aches:  Diet compliance: Good Exercise: very active 3-5 times a week, in pool, core. Other complaints:   Wt Readings from Last 3 Encounters:  04/23/16 197 lb 8 oz (89.6 kg)  11/14/15 199 lb (90.3 kg)  10/28/15 199 lb (90.3 kg)     BPH:  Stable on finasteride and flomax.   Hypertension:   Well controlled on losartan BP Readings from Last 3 Encounters:  04/23/16 110/60  11/14/15 134/67  10/28/15 130/80   Using medication without problems or lightheadedness:  None Chest pain with exertion: none Edema: none Short of breath: None Average home BPs: 140-150/60-70 Other issues:  Prediabetes: slight worsening Lab Results    Component Value Date   HGBA1C 6.4 04/16/2016    Social History /Family History/Past Medical History reviewed and updated if needed. Patient Care Team: Jinny Sanders, MD as PCP - General  Review of Systems  Constitutional: Negative for fatigue.  HENT: Negative for ear pain.   Eyes: Negative for pain.  Gastrointestinal: Positive for constipation.       Controlled with metamucil  Psychiatric/Behavioral: Negative for behavioral problems.       Objective:   Physical Exam  Constitutional: Vital signs are normal. He appears well-developed and well-nourished.  Decreased hearing, hearing aids  HENT:  Head: Normocephalic.  Right Ear: Hearing normal.  Left Ear: Hearing normal.  Nose: Nose normal.  Mouth/Throat: Oropharynx is clear and moist and mucous membranes are normal.  Neck: Trachea normal. Carotid bruit is not present. No thyroid mass and no thyromegaly present.  Cardiovascular: Normal rate, regular rhythm and normal pulses.  Exam reveals no gallop, no distant heart sounds and no friction rub.   No murmur heard. No peripheral edema  Pulmonary/Chest: Effort normal and breath sounds normal. No respiratory distress.  Skin: Skin is warm, dry and intact. No rash noted.  Psychiatric: He has a normal mood and affect. His speech is normal and behavior is normal. Thought content normal.          Assessment & Plan:  The patient's preventative maintenance and recommended screening tests for an annual wellness exam were reviewed in full  today. Brought up to date unless services declined.  Counselled on the importance of diet, exercise, and its role in overall health and mortality. The patient's FH and SH was reviewed, including their home life, tobacco status, and drug and alcohol status.    Vaccines: uptodate Prostate Cancer Screen: PSA every oher year per pt request  Lab Results  Component Value Date   PSA 0.91 04/16/2016   PSA 1.03 04/07/2015   PSA 0.73 04/05/2013  Colon  Cancer Screen:  2009, [facility],       Smoking Status: none

## 2016-04-23 NOTE — Assessment & Plan Note (Signed)
Moderate risk, but > age 82. Pt overall healthy but does not wish to initiated statin.  Will work on low chol diet.

## 2016-04-27 ENCOUNTER — Other Ambulatory Visit: Payer: Medicare PPO

## 2016-05-06 ENCOUNTER — Encounter: Payer: Medicare PPO | Admitting: Family Medicine

## 2016-05-22 ENCOUNTER — Other Ambulatory Visit: Payer: Self-pay | Admitting: Family Medicine

## 2016-07-12 ENCOUNTER — Other Ambulatory Visit: Payer: Self-pay | Admitting: Family Medicine

## 2016-10-15 ENCOUNTER — Other Ambulatory Visit: Payer: Self-pay | Admitting: Family Medicine

## 2017-01-04 ENCOUNTER — Encounter: Payer: Self-pay | Admitting: Family Medicine

## 2017-01-04 ENCOUNTER — Ambulatory Visit (INDEPENDENT_AMBULATORY_CARE_PROVIDER_SITE_OTHER): Payer: Medicare Other | Admitting: Family Medicine

## 2017-01-04 DIAGNOSIS — M791 Myalgia: Secondary | ICD-10-CM

## 2017-01-04 DIAGNOSIS — M5442 Lumbago with sciatica, left side: Secondary | ICD-10-CM | POA: Insufficient documentation

## 2017-01-04 DIAGNOSIS — M7918 Myalgia, other site: Secondary | ICD-10-CM

## 2017-01-04 MED ORDER — MELOXICAM 15 MG PO TABS: 15.0000 mg | ORAL_TABLET | Freq: Every day | ORAL | 1 refills | Status: DC

## 2017-01-04 NOTE — Progress Notes (Signed)
   Subjective:    Patient ID: Dustin Harrison, male    DOB: May 08, 1932, 83 y.o.   MRN: [id number]  HPI  83 year old male presents with new onset  Left buttock pain in  Last 3-4 month. Gradually worsening.  After driving back  From [place] recently... Radiated down leg. Has noted weakness in left leg, no numbness.  No fever.  he has been treating with stretching an tylenol.Marland Kitchen Helps some. No known falls, or injury.  occ low back pain.  Hx of slipped disc in low back  Review of Systems  Constitutional: Negative for fatigue.  HENT: Negative for ear pain.   Eyes: Negative for pain.  Respiratory: Negative for shortness of breath.   Cardiovascular: Negative for chest pain.       Objective:   Physical Exam  Constitutional: Vital signs are normal. He appears well-developed and well-nourished.  HENT:  Head: Normocephalic.  Right Ear: Hearing normal.  Left Ear: Hearing normal.  Nose: Nose normal.  Mouth/Throat: Oropharynx is clear and moist and mucous membranes are normal.  Neck: Trachea normal. Carotid bruit is not present. No thyroid mass and no thyromegaly present.  Cardiovascular: Normal rate, regular rhythm and normal pulses.  Exam reveals no gallop, no distant heart sounds and no friction rub.   No murmur heard. No peripheral edema  Pulmonary/Chest: Effort normal and breath sounds normal. No respiratory distress.  Musculoskeletal:       Lumbar back: He exhibits normal range of motion, no tenderness and no bony tenderness.  ttp in left buttock at sciatic notch.  Skin: Skin is warm, dry and intact. No rash noted.  Psychiatric: He has a normal mood and affect. His speech is normal and behavior is normal. Thought content normal.   Neg SLR bilaterally, neg faber's, excellent ROM of bilateral hips.       Assessment & Plan:

## 2017-01-04 NOTE — Patient Instructions (Signed)
Please stop at the front desk to set up referral for physical therapy referral.  Heat on low back and left buttock.  Start meloxicam daily for pain and inflammation x 2 weeks.  Do not use  Ibuprofen por aleve with this medication.

## 2017-01-04 NOTE — Assessment & Plan Note (Signed)
Good ROM of hip.  Most likely pain from lumbar spine and sciatica, but mild on exam today.  Treat with heat, NSAIDs and PT.  No indication for X-ray at this time.

## 2017-01-17 ENCOUNTER — Other Ambulatory Visit: Payer: Self-pay | Admitting: Family Medicine

## 2017-01-26 ENCOUNTER — Other Ambulatory Visit: Payer: Self-pay | Admitting: Family Medicine

## 2017-03-03 ENCOUNTER — Telehealth: Payer: Self-pay

## 2017-03-03 NOTE — Telephone Encounter (Signed)
Pt' s buttock pain is likely from sciatica as discussed at last OV.Marland Kitchen Neurologists don't treat this.. I would recommend initial further eval with follow up in our office with X-ray and possibly MRI before referral.

## 2017-03-03 NOTE — Telephone Encounter (Signed)
Appointment scheduled on 03/10/2017 @ 9:30 am with Dr. Diona Browner.

## 2017-03-03 NOTE — Telephone Encounter (Signed)
Left message for Dustin Harrison to call the office and schedule follow up with Dr. Diona Browner for further evaluation of buttock pain before referral.

## 2017-03-03 NOTE — Telephone Encounter (Signed)
Pt left v/m requesting referral for neurologist for lt buttock pain; pt seen 01/04/17 and still having problems and request cb.

## 2017-03-10 ENCOUNTER — Encounter: Payer: Self-pay | Admitting: Family Medicine

## 2017-03-10 ENCOUNTER — Other Ambulatory Visit: Payer: Self-pay | Admitting: Family Medicine

## 2017-03-10 ENCOUNTER — Ambulatory Visit (INDEPENDENT_AMBULATORY_CARE_PROVIDER_SITE_OTHER): Payer: Medicare Other | Admitting: Family Medicine

## 2017-03-10 DIAGNOSIS — M5442 Lumbago with sciatica, left side: Secondary | ICD-10-CM

## 2017-03-10 NOTE — Progress Notes (Signed)
   Subjective:    Patient ID: Dustin Harrison, male    DOB: 24-Feb-1932, 83 y.o.   MRN: [id number]  HPI  83 year old male presents for follow up on left buttock pain.   Seen  On 7/31 with new onset pain. Felt due to sciatica.  Treated with meloxicam, referral to PT, heat.  Today he reports he has had significant improvement in pain .Marland Kitchen He still has flares of  left buttock pain and low back pain.. After recent sweeping.. Pain flared up to 8-9/10 on pain scale. Now pain has improved back to 1/10 on pain scale with continue PT. Intermittantly has weakness and numbness in left buttock when he is in pain.  He is using ibuprofen 400 mg I AM and  No more than 6 total a day.    Hx of slipped disc in back.   Review of Systems  Constitutional: Negative for fatigue and fever.  HENT: Negative for ear pain.   Eyes: Negative for pain.  Respiratory: Negative for cough and shortness of breath.   Cardiovascular: Negative.  Negative for chest pain, palpitations and leg swelling.  Gastrointestinal: Negative for abdominal pain.  Genitourinary: Negative for dysuria.  Musculoskeletal: Negative for arthralgias.  Neurological: Negative for syncope, light-headedness and headaches.  Psychiatric/Behavioral: Negative for dysphoric mood.       Objective:   Physical Exam  Constitutional: Vital signs are normal. He appears well-developed and well-nourished.  HENT:  Head: Normocephalic.  Right Ear: Hearing normal.  Left Ear: Hearing normal.  Nose: Nose normal.  Mouth/Throat: Oropharynx is clear and moist and mucous membranes are normal.  Neck: Trachea normal. Carotid bruit is not present. No thyroid mass and no thyromegaly present.  Cardiovascular: Normal rate, regular rhythm and normal pulses.  Exam reveals no gallop, no distant heart sounds and no friction rub.   No murmur heard. No peripheral edema  Pulmonary/Chest: Effort normal and breath sounds normal. No respiratory distress.  Musculoskeletal:        Lumbar back: He exhibits decreased range of motion, tenderness and bony tenderness.  ttp in left buttock at sciatic notch.  neg SLR  Neurological: He has normal strength. No sensory deficit. He exhibits normal muscle tone.  Skin: Skin is warm, dry and intact. No rash noted.  Psychiatric: He has a normal mood and affect. His speech is normal and behavior is normal. Thought content normal.          Assessment & Plan:

## 2017-03-10 NOTE — Patient Instructions (Addendum)
Start meloxicam  in AM instead of ibuprofen. Call if it is not helping as much  And we can alternatively try diclofenac.  Call with update next week and come in for X-ray when able.. No appt needed.

## 2017-03-10 NOTE — Assessment & Plan Note (Signed)
Will eval with Iris Pert given age and persistence of pain.  Pt does not wish to have steroid injection or MRI. E understands risk of prolonged NSAIDs.. Wishes to continue meloxicam for pain.

## 2017-03-10 NOTE — Telephone Encounter (Signed)
This is for future refills.  Do you want to approve or deny since we may be changing to Diclofenac??

## 2017-03-15 ENCOUNTER — Ambulatory Visit (INDEPENDENT_AMBULATORY_CARE_PROVIDER_SITE_OTHER)
Admission: RE | Admit: 2017-03-15 | Discharge: 2017-03-15 | Disposition: A | Discharge status: Home / Self Care | Payer: Medicare Other | Source: Ambulatory Visit | Attending: Family Medicine | Admitting: Family Medicine

## 2017-03-15 ENCOUNTER — Encounter: Payer: Self-pay | Admitting: Family Medicine

## 2017-03-15 DIAGNOSIS — M5442 Lumbago with sciatica, left side: Secondary | ICD-10-CM

## 2017-03-15 IMAGING — DX DG LUMBAR SPINE COMPLETE 4+V
5 series · 5 of 5 positions shown · non-contrast
Comparison: None.

CLINICAL DATA: 85-year-old male with left low back pain. No
reported trauma. Initial encounter.

EXAM:
LUMBAR SPINE - COMPLETE 4+ VIEW

[l-spine ap]
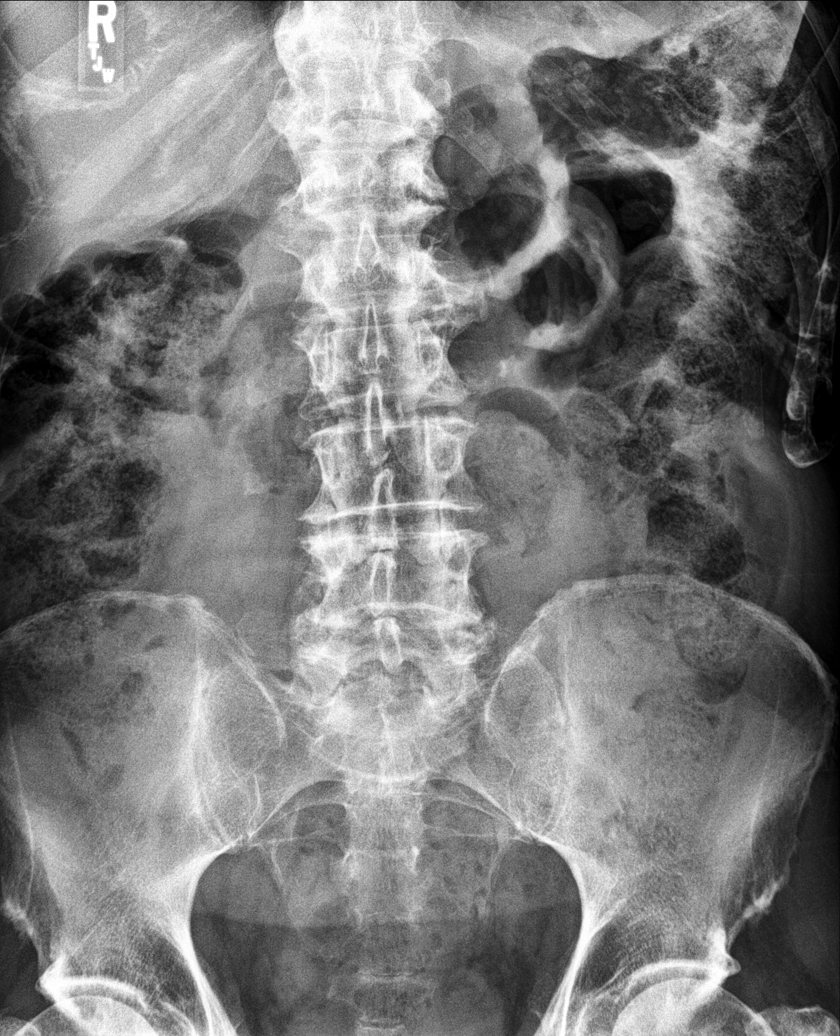

[l-spine obl (1 of 2)]
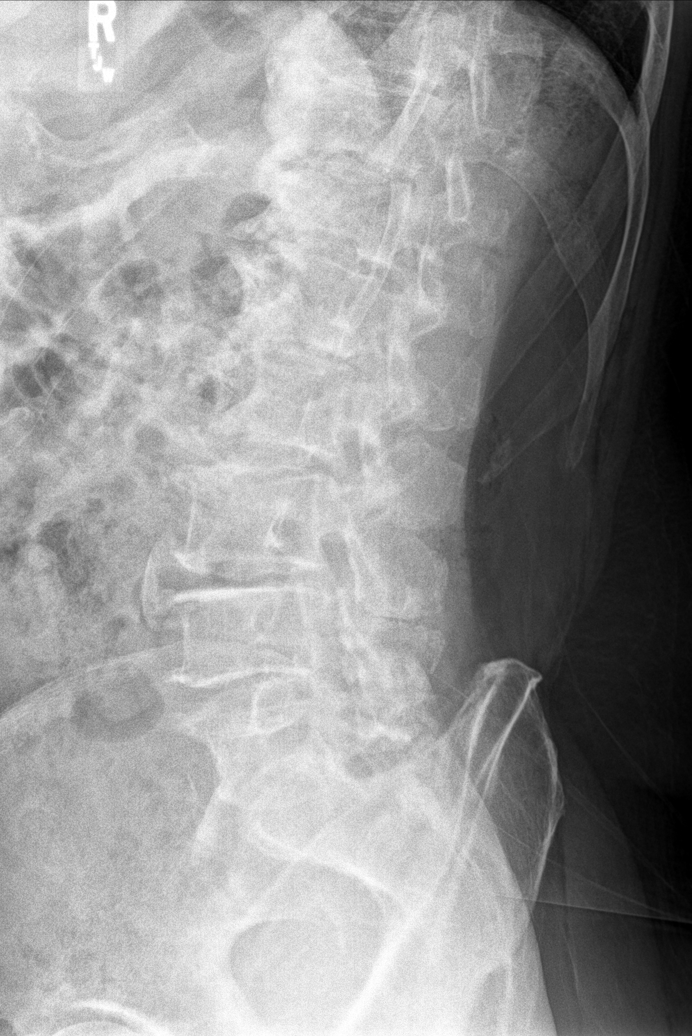

[l-spine obl (2 of 2)]
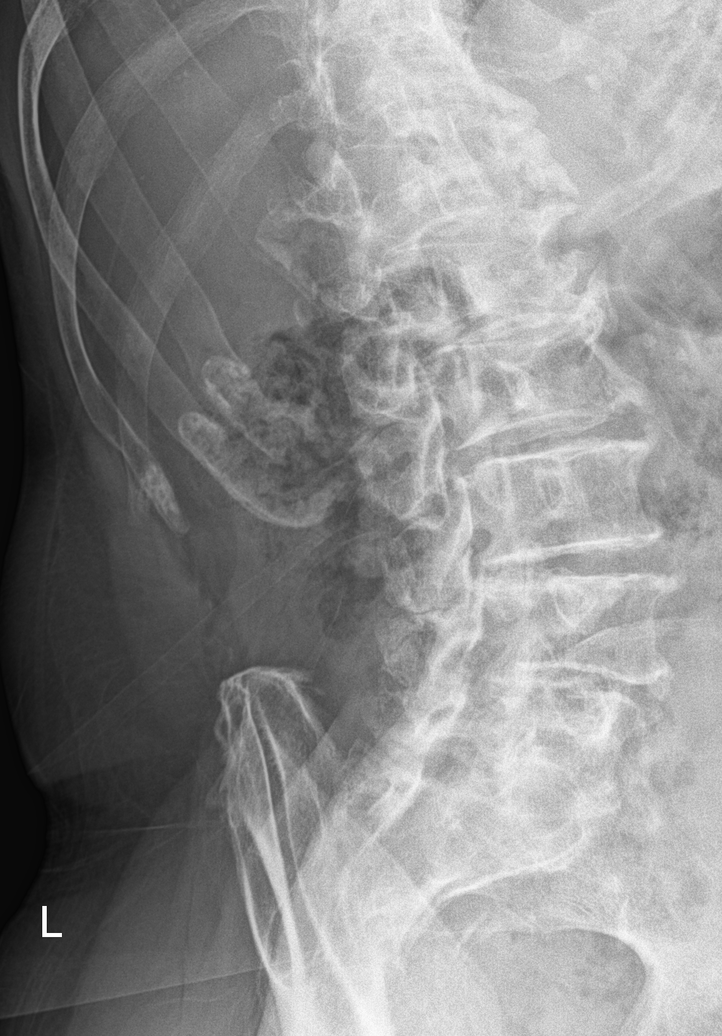

[l-spine lat]
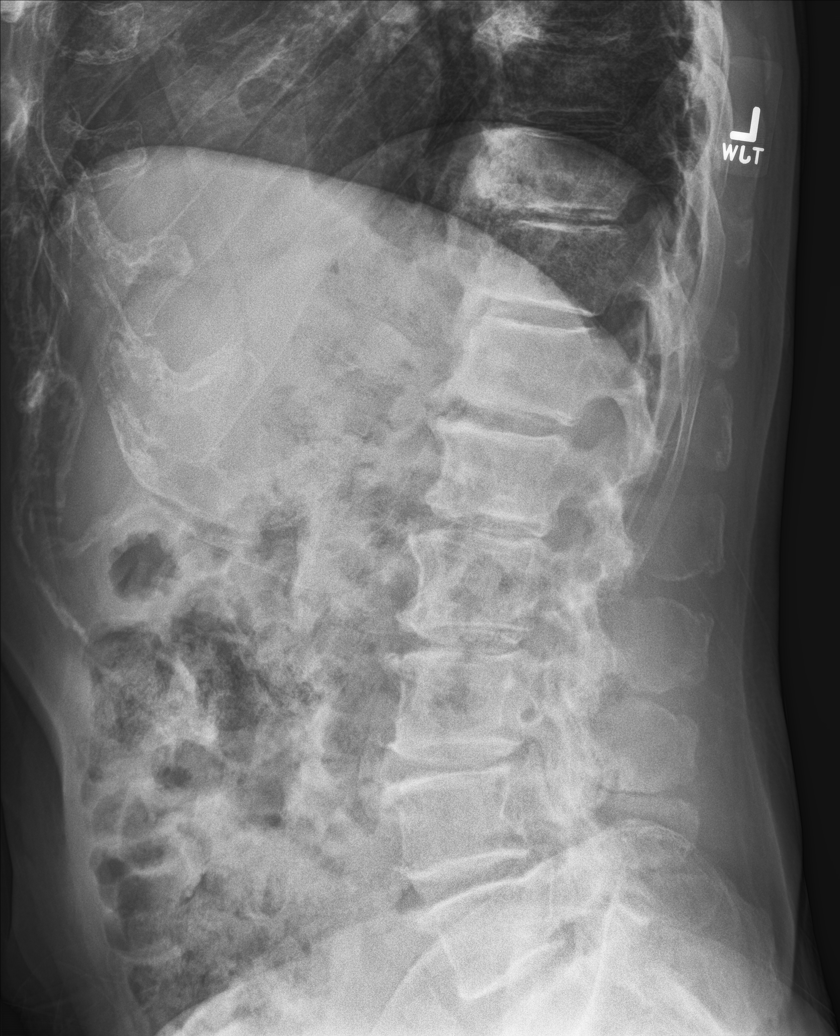

[l-spine l5/s1]
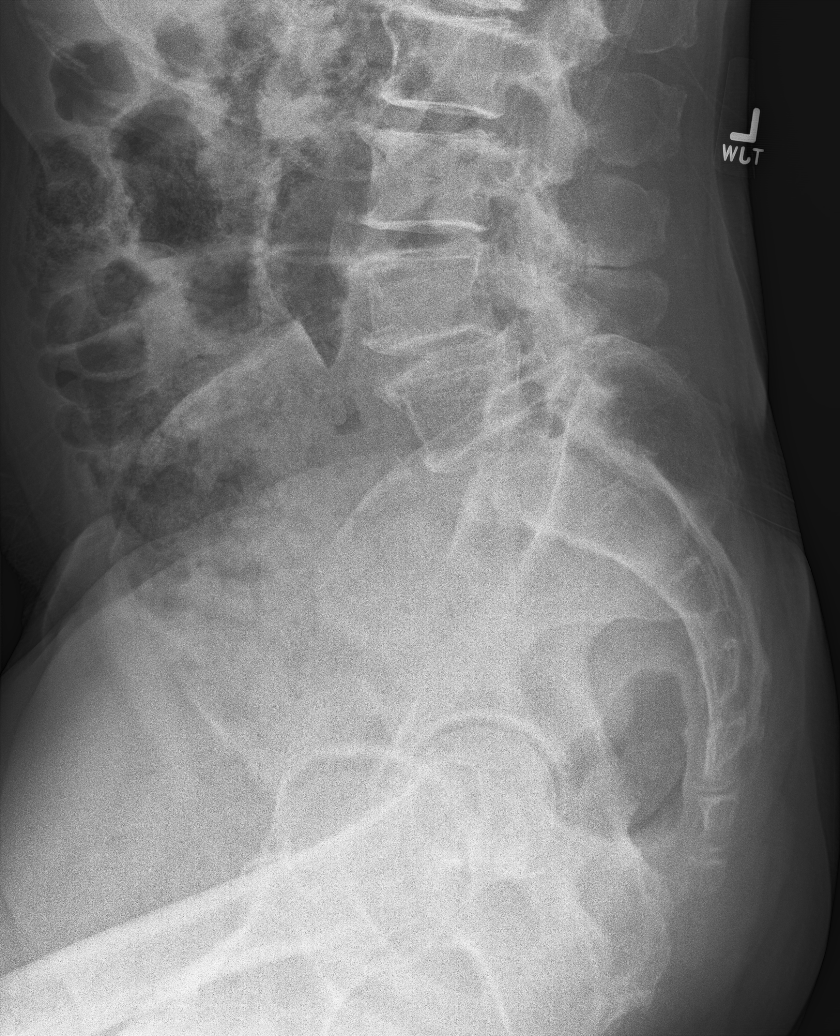

[5 of 5 positions shown; findings below may reference images not displayed]

FINDINGS: Facet degenerative changes most notable L3-4 and L4-5. Minimal
anterior slip L4 and minimal retrolisthesis L3.

Minimal L3-4, L4-5 and L5-S1 disc space narrowing.

No focal compression fracture.

Moderate stool throughout the colon.
IMPRESSION: Facet degenerative changes most notable L3-4 and L4-5. Minimal
anterior slip L4 and minimal retrolisthesis L3.

Minimal L3-4, L4-5 and L5-S1 disc space narrowing.

No focal compression fracture.

## 2017-03-16 ENCOUNTER — Encounter: Payer: Self-pay | Admitting: Family Medicine

## 2017-03-22 MED ORDER — DICLOFENAC SODIUM 75 MG PO TBEC: 75.0000 mg | DELAYED_RELEASE_TABLET | Freq: Two times a day (BID) | ORAL | 0 refills | Status: DC

## 2017-03-23 ENCOUNTER — Other Ambulatory Visit: Payer: Self-pay | Admitting: Family Medicine

## 2017-03-30 ENCOUNTER — Encounter: Payer: Self-pay | Admitting: Family Medicine

## 2017-03-31 ENCOUNTER — Encounter: Payer: Self-pay | Admitting: Family Medicine

## 2017-03-31 NOTE — Telephone Encounter (Signed)
Copied from [place]. Topic: Inquiry >> Mar 31, 2017  4:01 PM Malena Catholic I, NT wrote: Reason for CRM: pt  RX requesting pack for pain

## 2017-04-01 ENCOUNTER — Encounter: Payer: Self-pay | Admitting: Family Medicine

## 2017-04-26 ENCOUNTER — Telehealth: Payer: Self-pay

## 2017-04-26 DIAGNOSIS — E7800 Pure hypercholesterolemia, unspecified: Secondary | ICD-10-CM

## 2017-04-26 DIAGNOSIS — R7303 Prediabetes: Secondary | ICD-10-CM

## 2017-04-26 DIAGNOSIS — R944 Abnormal results of kidney function studies: Secondary | ICD-10-CM

## 2017-04-26 DIAGNOSIS — Z125 Encounter for screening for malignant neoplasm of prostate: Secondary | ICD-10-CM

## 2017-04-26 NOTE — Telephone Encounter (Signed)
Spoke with spouse regarding AWV appt. Reminder given requesting patient to fast for CPE labs.  CPE lab orders placed.

## 2017-04-27 ENCOUNTER — Ambulatory Visit (INDEPENDENT_AMBULATORY_CARE_PROVIDER_SITE_OTHER): Payer: Medicare Other

## 2017-04-27 VITALS — BP 102/78 | HR 67 | Temp 97.9°F | Ht 71.5 in | Wt 187.2 lb

## 2017-04-27 DIAGNOSIS — R944 Abnormal results of kidney function studies: Secondary | ICD-10-CM | POA: Diagnosis not present

## 2017-04-27 DIAGNOSIS — R7303 Prediabetes: Secondary | ICD-10-CM

## 2017-04-27 DIAGNOSIS — Z0000 Encounter for general adult medical examination without abnormal findings: Secondary | ICD-10-CM

## 2017-04-27 DIAGNOSIS — Z125 Encounter for screening for malignant neoplasm of prostate: Secondary | ICD-10-CM | POA: Diagnosis not present

## 2017-04-27 DIAGNOSIS — E7800 Pure hypercholesterolemia, unspecified: Secondary | ICD-10-CM

## 2017-04-27 LAB — COMPREHENSIVE METABOLIC PANEL
ALK PHOS: 65 U/L (ref 39–117)
ALT: 23 U/L (ref 0–53)
AST: 20 U/L (ref 0–37)
Albumin: 4.6 g/dL (ref 3.5–5.2)
BUN: 30 mg/dL — AB (ref 6–23)
CHLORIDE: 102 meq/L (ref 96–112)
CO2: 32 meq/L (ref 19–32)
Calcium: 10.2 mg/dL (ref 8.4–10.5)
Creatinine, Ser: 1.42 mg/dL (ref 0.40–1.50)
GFR: 50.57 mL/min — AB (ref >60.00)
GLUCOSE: 144 mg/dL — AB (ref 70–99)
POTASSIUM: 5.2 meq/L — AB (ref 3.5–5.1)
SODIUM: 141 meq/L (ref 135–145)
TOTAL PROTEIN: 7.1 g/dL (ref 6.0–8.3)
Total Bilirubin: 0.8 mg/dL (ref 0.2–1.2)

## 2017-04-27 LAB — LIPID PANEL
CHOL/HDL RATIO: 3
Cholesterol: 254 mg/dL — ABNORMAL HIGH (ref 0–200)
HDL: 87.9 mg/dL (ref >39.00)
LDL CALC: 154 mg/dL — AB (ref 0–99)
NONHDL: 166.53
Triglycerides: 62 mg/dL (ref 0.0–149.0)
VLDL: 12.4 mg/dL (ref 0.0–40.0)

## 2017-04-27 LAB — PSA, MEDICARE: PSA: 1.21 ng/mL (ref 0.10–4.00)

## 2017-04-27 LAB — HEMOGLOBIN A1C: HEMOGLOBIN A1C: 6.5 % (ref 4.6–6.5)

## 2017-04-27 NOTE — Progress Notes (Signed)
Pre visit review using our clinic review tool, if applicable. No additional management support is needed unless otherwise documented below in the visit note. 

## 2017-04-27 NOTE — Progress Notes (Signed)
PCP notes:   Health maintenance:  No gaps identified  Abnormal screenings:   None  Patient concerns:   Patient wants to discuss long-term use of Prilosec.  Nurse concerns:  None  Next PCP appt:   05/10/17 @ 0830

## 2017-04-27 NOTE — Progress Notes (Signed)
Subjective:   Dustin Harrison is a 83 y.o. male who presents for Medicare Annual/Subsequent preventive examination.  Review of Systems: N/A Cardiac Risk Factors include: advanced age (>90men, >11 women);male gender;dyslipidemia;hypertension     Objective:    Vitals: BP 102/78 (BP Location: Right Arm, Patient Position: Sitting, Cuff Size: Normal)   Pulse 67   Temp 97.9 F (36.6 C) (Oral)   Ht 5' 11.5" (1.816 m) Comment: no shoes  Wt 187 lb 4 oz (84.9 kg)   SpO2 97%   BMI 25.75 kg/m   Body mass index is 25.75 kg/m.  Tobacco Social History   Tobacco Use  Smoking Status Never Smoker  Smokeless Tobacco Never Used     Counseling given: No   Past Medical History:  Diagnosis Date  . Abnormal glucose   . Arthritis   . Baker's cyst of knee    left  . BPH (benign prostatic hypertrophy)   . Cough    because of "tight" esophagus  . Diabetes mellitus   . Glaucoma   . HOH (hard of hearing)   . Hypertension   . Pheochromocytoma of right adrenal gland   . Ulcer   . Wears hearing aid    bilateral   Past Surgical History:  Procedure Laterality Date  . adrenaletomy    . CATARACT EXTRACTION W/PHACO Left 10/08/2015   Procedure: CATARACT EXTRACTION PHACO AND INTRAOCULAR LENS PLACEMENT ([facility]) left eye;  Surgeon: Leandrew Koyanagi, MD;  Location: [facility];  Service: Ophthalmology;  Laterality: Left;  MALYUGIN  . HERNIA REPAIR    . SQUAMOUS CELL CARCINOMA EXCISION  03-2006   left ear  . TONSILLECTOMY     Family History  Problem Relation Age of Onset  . Alcohol abuse Mother   . Coronary artery disease Mother   . Brain cancer Father        tumor  . Diabetes Brother    Social History   Substance and Sexual Activity  Sexual Activity Not on file    Outpatient Encounter Medications as of 04/27/2017  Medication Sig  . bimatoprost (LUMIGAN) 0.01 % SOLN Place 1 drop into both eyes daily.   . finasteride (PROSCAR) 5 MG tablet TAKE ONE TABLET BY MOUTH EVERY DAY    . IBUPROFEN PO Take 400 mg by mouth daily.  Marland Kitchen losartan (COZAAR) 100 MG tablet TAKE ONE TABLET EVERY DAY  . Multiple Vitamin (MULTIVITAMIN) tablet Take one by mouth every other day  . omeprazole (PRILOSEC) 20 MG capsule Take 20 mg by mouth daily.  . tamsulosin (FLOMAX) 0.4 MG CAPS capsule TAKE 1 CAPSULE BY MOUTH DAILY  . timolol (TIMOPTIC) 0.5 % ophthalmic solution Place 1 drop into both eyes 2 (two) times daily.   . [DISCONTINUED] acetaZOLAMIDE (DIAMOX) 125 MG tablet Take 1 tablet (125 mg total) by mouth 2 (two) times daily. Start at first sign of altitude sickness. Continue 24 hours after symptoms have resolved.  . [DISCONTINUED] aspirin 81 MG tablet Take one by mouth every other day  . [DISCONTINUED] diclofenac (VOLTAREN) 75 MG EC tablet Take 1 tablet (75 mg total) by mouth 2 (two) times daily.  . [DISCONTINUED] docusate sodium (COLACE) 100 MG capsule Take one capsule by mouth every other day   No facility-administered encounter medications on file as of 04/27/2017.     Activities of Daily Living In your present state of health, do you have any difficulty performing the following activities: 04/27/2017  Hearing? Y  Vision? N  Difficulty concentrating or making  decisions? N  Walking or climbing stairs? N  Dressing or bathing? N  Doing errands, shopping? N  Preparing Food and eating ? N  Using the Toilet? N  In the past six months, have you accidently leaked urine? N  Do you have problems with loss of bowel control? N  Managing your Medications? N  Managing your Finances? N  Housekeeping or managing your Housekeeping? N  Some recent data might be hidden    Patient Care Team: Jinny Sanders, MD as PCP - General Leandrew Koyanagi, MD as Referring Physician (Ophthalmology) Dasher, Rayvon Char, MD as Referring Physician (Dermatology)   Assessment:    Hearing Screening Comments: Bilateral hearing aids Vision Screening Comments: Last vision exam on Sept 6, 2018 with Dr. Leandrew Koyanagi  Exercise Activities and Dietary recommendations Current Exercise Habits: Structured exercise class, Type of exercise: Other - see comments(water aerobics 60 min 3 days per week, balance class 60 min 2 days), Time (Minutes): 60, Frequency (Times/Week): 5, Weekly Exercise (Minutes/Week): 300, Intensity: Moderate, Exercise limited by: None identified  Goals    . Increase physical activity     Starting 04/27/2017, I will continue to exercise for at least 60 minutes 5 days per week.       Fall Risk Fall Risk  04/27/2017 04/23/2016 04/15/2015 01/17/2014 01/11/2013  Falls in the past year? No No No No No   Depression Screen PHQ 2/9 Scores 04/27/2017 04/23/2016 04/15/2015 01/17/2014  PHQ - 2 Score 0 0 0 0  PHQ- 9 Score 0 - - -    Cognitive Function MMSE - Mini Mental State Exam 04/27/2017  Orientation to time 5  Orientation to Place 5  Registration 3  Attention/ Calculation 0  Recall 3  Language- name 2 objects 0  Language- repeat 1  Language- follow 3 step command 3  Language- read & follow direction 0  Write a sentence 0  Copy design 0  Total score 20     PLEASE NOTE: A Mini-Cog screen was completed. Maximum score is 20. A value of 0 denotes this part of Folstein MMSE was not completed or the patient failed this part of the Mini-Cog screening.   Mini-Cog Screening Orientation to Time - Max 5 pts Orientation to Place - Max 5 pts Registration - Max 3 pts Recall - Max 3 pts Language Repeat - Max 1 pts Language Follow 3 Step Command - Max 3 pts     Immunization History  Administered Date(s) Administered  . Influenza Whole 02/25/2009, 01/30/2010, 02/11/2012  . Influenza, High Dose Seasonal PF 01/25/2015, 02/17/2016, 03/03/2017  . Influenza-Unspecified 01/09/2013  . Pneumococcal Conjugate-13 01/17/2014  . Pneumococcal Polysaccharide-23 03/07/2006  . Td 11/08/2006  . Tdap 09/05/2013  . Zoster 12/16/2005  . Zoster Recombinat (Shingrix) 08/27/2016, 11/24/2016    Screening Tests Health Maintenance  Topic Date Due  . TETANUS/TDAP  09/06/2023  . INFLUENZA VACCINE  Completed  . PNA vac Low Risk Adult  Completed      Plan:     I have personally reviewed, addressed, and noted the following in the patient's chart:  A. Medical and social history B. Use of alcohol, tobacco or illicit drugs  C. Current medications and supplements D. Functional ability and status E.  Nutritional status F.  Physical activity G. Advance directives H. List of other physicians I.  Hospitalizations, surgeries, and ER visits in previous 12 months J.  [place] to include hearing, vision, cognitive, depression L. Referrals and appointments - none  In addition, I have reviewed and discussed with patient certain preventive protocols, quality metrics, and best practice recommendations. A written personalized care plan for preventive services as well as general preventive health recommendations were provided to patient.  See attached scanned questionnaire for additional information.   Signed,   Lindell Noe, MHA, BS, LPN Health Coach

## 2017-04-27 NOTE — Patient Instructions (Signed)
Dustin Harrison , Thank you for taking time to come for your Medicare Wellness Visit. I appreciate your ongoing commitment to your health goals. Please review the following plan we discussed and let me know if I can assist you in the future.   These are the goals we discussed: Goals    . Increase physical activity     Starting 04/27/2017, I will continue to exercise for at least 60 minutes 5 days per week.        This is a list of the screening recommended for you and due dates:  Health Maintenance  Topic Date Due  . Tetanus Vaccine  09/06/2023  . Flu Shot  Completed  . Pneumonia vaccines  Completed   Preventive Care for Adults  A healthy lifestyle and preventive care can promote health and wellness. Preventive health guidelines for adults include the following key practices.  . A routine yearly physical is a good way to check with your health care provider about your health and preventive screening. It is a chance to share any concerns and updates on your health and to receive a thorough exam.  . Visit your dentist for a routine exam and preventive care every 6 months. Brush your teeth twice a day and floss once a day. Good oral hygiene prevents tooth decay and gum disease.  . The frequency of eye exams is based on your age, health, family medical history, use  of contact lenses, and other factors. Follow your health care provider's recommendations for frequency of eye exams.  . Eat a healthy diet. Foods like vegetables, fruits, whole grains, low-fat dairy products, and lean protein foods contain the nutrients you need without too many calories. Decrease your intake of foods high in solid fats, added sugars, and salt. Eat the right amount of calories for you. Get information about a proper diet from your health care provider, if necessary.  . Regular physical exercise is one of the most important things you can do for your health. Most adults should get at least 150 minutes of  moderate-intensity exercise (any activity that increases your heart rate and causes you to sweat) each week. In addition, most adults need muscle-strengthening exercises on 2 or more days a week.  Silver Sneakers may be a benefit available to you. To determine eligibility, you may visit the website: www.silversneakers.com or contact program at 214 439 5448 Mon-Fri between 8AM-8PM.   . Maintain a healthy weight. The body mass index (BMI) is a screening tool to identify possible weight problems. It provides an estimate of body fat based on height and weight. Your health care provider can find your BMI and can help you achieve or maintain a healthy weight.   For adults 20 years and older: ? A BMI below 18.5 is considered underweight. ? A BMI of 18.5 to 24.9 is normal. ? A BMI of 25 to 29.9 is considered overweight. ? A BMI of 30 and above is considered obese.   . Maintain normal blood lipids and cholesterol levels by exercising and minimizing your intake of saturated fat. Eat a balanced diet with plenty of fruit and vegetables. Blood tests for lipids and cholesterol should begin at age 9 and be repeated every 5 years. If your lipid or cholesterol levels are high, you are over 50, or you are at high risk for heart disease, you may need your cholesterol levels checked more frequently. Ongoing high lipid and cholesterol levels should be treated with medicines if diet and exercise are not  working.  . If you smoke, find out from your health care provider how to quit. If you do not use tobacco, please do not start.  . If you choose to drink alcohol, please do not consume more than 2 drinks per day. One drink is considered to be 12 ounces (355 mL) of beer, 5 ounces (148 mL) of wine, or 1.5 ounces (44 mL) of liquor.  . If you are 68-63 years old, ask your health care provider if you should take aspirin to prevent strokes.  . Use sunscreen. Apply sunscreen liberally and repeatedly throughout the day. You  should seek shade when your shadow is shorter than you. Protect yourself by wearing long sleeves, pants, a wide-brimmed hat, and sunglasses year round, whenever you are outdoors.  . Once a month, do a whole body skin exam, using a mirror to look at the skin on your back. Tell your health care provider of new moles, moles that have irregular borders, moles that are larger than a pencil eraser, or moles that have changed in shape or color.

## 2017-04-30 NOTE — Progress Notes (Signed)
I reviewed health advisor's note, was available for consultation, and agree with documentation and plan.   Signed,  Chetan Mehring T. Titilayo Hagans, MD  

## 2017-05-09 ENCOUNTER — Other Ambulatory Visit: Payer: Self-pay | Admitting: Family Medicine

## 2017-05-10 ENCOUNTER — Encounter: Payer: Self-pay | Admitting: Family Medicine

## 2017-05-10 ENCOUNTER — Ambulatory Visit: Payer: Medicare Other | Admitting: Family Medicine

## 2017-05-10 ENCOUNTER — Other Ambulatory Visit: Payer: Self-pay | Admitting: Family Medicine

## 2017-05-10 VITALS — BP 114/70 | HR 60 | Temp 97.5°F | Ht 71.5 in | Wt 194.2 lb

## 2017-05-10 DIAGNOSIS — I10 Essential (primary) hypertension: Secondary | ICD-10-CM

## 2017-05-10 DIAGNOSIS — Z0000 Encounter for general adult medical examination without abnormal findings: Secondary | ICD-10-CM

## 2017-05-10 DIAGNOSIS — R7303 Prediabetes: Secondary | ICD-10-CM | POA: Diagnosis not present

## 2017-05-10 DIAGNOSIS — E119 Type 2 diabetes mellitus without complications: Secondary | ICD-10-CM

## 2017-05-10 DIAGNOSIS — N182 Chronic kidney disease, stage 2 (mild): Secondary | ICD-10-CM | POA: Diagnosis not present

## 2017-05-10 DIAGNOSIS — E7800 Pure hypercholesterolemia, unspecified: Secondary | ICD-10-CM | POA: Diagnosis not present

## 2017-05-10 DIAGNOSIS — M5442 Lumbago with sciatica, left side: Secondary | ICD-10-CM

## 2017-05-10 DIAGNOSIS — N401 Enlarged prostate with lower urinary tract symptoms: Secondary | ICD-10-CM

## 2017-05-10 DIAGNOSIS — Z8719 Personal history of other diseases of the digestive system: Secondary | ICD-10-CM

## 2017-05-10 LAB — HM DIABETES FOOT EXAM

## 2017-05-10 MED ORDER — ATORVASTATIN CALCIUM 20 MG PO TABS: 20.0000 mg | ORAL_TABLET | Freq: Every day | ORAL | 11 refills | Status: DC

## 2017-05-10 NOTE — Assessment & Plan Note (Signed)
Slight worsening of NSAIDS. Hold.

## 2017-05-10 NOTE — Progress Notes (Signed)
Subjective:    Patient ID: Dustin Harrison, male    DOB: 1932/01/22, 83 y.o.   MRN: [id number]  HPI   The patient presents for  complete physical and review of chronic health problems. He/She also has the following acute concerns today: he continues to have low back pain. Using ice pack, doing home PT, ibuprofen 400 mg   The patient saw Candis Musa, LPN for medicare wellness. Note reviewed in detail and important notes copied below. Health maintenance: No gaps identified  Abnormal screenings:  None  Patient concerns:  Patient wants to discuss long-term use of Prilosec.  Today 05/10/17   BPH:   Worsening on finasteride and flomax, waking him up multiple times a night. He is not taking finasetride daily  Hypertension:   Good control on losartan  Using medication without problems or lightheadedness:  none Chest pain with exertion: none Edema:none Short of breath: none Average home BPs: Other issues:  Elevated Cholesterol:  Inadequate control... 26% risk of CAD in 10 year.. With elevated glucose / DM diagnosis.. Risk increases to 46 % in 10 years. Lab Results  Component Value Date   CHOL 254 (H) 04/27/2017   HDL 87.90 04/27/2017   LDLCALC 154 (H) 04/27/2017   LDLDIRECT 126.2 04/05/2013   TRIG 62.0 04/27/2017   CHOLHDL 3 04/27/2017  Using medications without problems: Muscle aches:  Diet compliance: moderate Exercise: yes Other complaints:  Prediabetes .Marland Kitchen Now dx of DM. Lab Results  Component Value Date   HGBA1C 6.5 04/27/2017    CKD: gradually worsening.. Try to stop  NSAIDs for back.   High potassium.. Drinking a lot of V8   Social History /Family History/Past Medical History reviewed in detail and updated in EMR if needed. Blood pressure 114/70, pulse 60, temperature (!) 97.5 F (36.4 C), temperature source Oral, height 5' 11.5" (1.816 m), weight 194 lb 4 oz (88.1 kg), SpO2 96 %.   Review of Systems  Constitutional: Negative for fatigue and  fever.  HENT: Negative for ear pain.   Eyes: Negative for pain.  Respiratory: Negative for cough and shortness of breath.   Cardiovascular: Negative for chest pain, palpitations and leg swelling.  Gastrointestinal: Negative for abdominal pain.  Genitourinary: Negative for dysuria.  Musculoskeletal: Negative for arthralgias.  Neurological: Negative for syncope, light-headedness and headaches.  Psychiatric/Behavioral: Negative for dysphoric mood.       Objective:   Physical Exam  Constitutional: He appears well-developed and well-nourished.  Non-toxic appearance. He does not appear ill. No distress.  HENT:  Head: Normocephalic and atraumatic.  Right Ear: Hearing, tympanic membrane, external ear and ear canal normal.  Left Ear: Hearing, tympanic membrane, external ear and ear canal normal.  Nose: Nose normal.  Mouth/Throat: Uvula is midline, oropharynx is clear and moist and mucous membranes are normal.  Eyes: Conjunctivae, EOM and lids are normal. Pupils are equal, round, and reactive to light. Lids are everted and swept, no foreign bodies found.  Neck: Trachea normal, normal range of motion and phonation normal. Neck supple. Carotid bruit is not present. No thyroid mass and no thyromegaly present.  Cardiovascular: Normal rate, regular rhythm, S1 normal, S2 normal, intact distal pulses and normal pulses. Exam reveals no gallop.  No murmur heard. Pulmonary/Chest: Breath sounds normal. He has no wheezes. He has no rhonchi. He has no rales.  Abdominal: Soft. Normal appearance and bowel sounds are normal. There is no hepatosplenomegaly. There is no tenderness. There is no rebound, no guarding and no CVA tenderness.  No hernia.  Lymphadenopathy:    He has no cervical adenopathy.  Neurological: He is alert. He has normal strength and normal reflexes. No cranial nerve deficit or sensory deficit. Gait normal.  Skin: Skin is warm, dry and intact. No rash noted.  Psychiatric: He has a normal mood  and affect. His speech is normal and behavior is normal. Judgment normal.     Diabetic foot exam: Normal inspection No skin breakdown No calluses  Normal DP pulses Normal sensation to light touch and monofilament Nails normal      Assessment & Plan:  The patient's preventative maintenance and recommended screening tests for an annual wellness exam were reviewed in full today. Brought up to date unless services declined.  Counselled on the importance of diet, exercise, and its role in overall health and mortality. The patient's FH and SH was reviewed, including their home life, tobacco status, and drug and alcohol status.   Vaccines: uptodate Prostate Cancer Screen: PSA every other year per pt request  Lab Results  Component Value Date   PSA 1.21 04/27/2017   PSA 0.91 04/16/2016   PSA 1.03 04/07/2015       Colon Cancer Screen:  2009, [place],       Smoking Status: none

## 2017-05-10 NOTE — Assessment & Plan Note (Signed)
Well controlled. Continue current medication.  

## 2017-05-10 NOTE — Assessment & Plan Note (Signed)
Make sure using finasteride daily along with tamsulosin. If still increased nocturia.. Consider referral to [facility].

## 2017-05-10 NOTE — Patient Instructions (Addendum)
Please stop at the front desk to set up referral to nutrition.  Start atorvastatin 20 mg  Daily.  Return for lab only visit in 1 week after stopping V8 for potassium recheck. Try changing to 2 extra strength tylenol in morning instead of ibuprofen. If able to stop ibuprofen.. You may be able to stop prilosec.  If low back pain is continuing to require ibuprofen you may want to consider referral for steroid injections in back.  Make sure taking finasteride every day.

## 2017-05-10 NOTE — Assessment & Plan Note (Signed)
Cautioned pt against longterm  NSAID use.

## 2017-05-10 NOTE — Assessment & Plan Note (Signed)
New Dx. Referred to nutritionist. No need for medication as  Borderline.  Pt now at even higher risk of CVD.

## 2017-05-10 NOTE — Assessment & Plan Note (Signed)
Pt not interested in referral to  Back specialist for consideration of steroid injections.  Will try trial of tylenol instead of ibuprofen.

## 2017-05-10 NOTE — Assessment & Plan Note (Signed)
Very high risk for CVD.Marland Kitchen Start moderate dose statin and follow up in 3 months.

## 2017-05-19 ENCOUNTER — Telehealth: Payer: Self-pay | Admitting: Family Medicine

## 2017-05-19 DIAGNOSIS — E875 Hyperkalemia: Secondary | ICD-10-CM

## 2017-05-19 NOTE — Telephone Encounter (Signed)
-----   Message from Ellamae Sia sent at 05/18/2017  7:09 PM EST ----- Regarding: Lab orders for Friday, 12.14.18 Lab orders, Thanks

## 2017-05-20 ENCOUNTER — Other Ambulatory Visit (INDEPENDENT_AMBULATORY_CARE_PROVIDER_SITE_OTHER): Payer: Medicare Other

## 2017-05-20 DIAGNOSIS — E875 Hyperkalemia: Secondary | ICD-10-CM

## 2017-05-20 LAB — POTASSIUM: POTASSIUM: 4.7 meq/L (ref 3.5–5.1)

## 2017-06-08 DIAGNOSIS — M79605 Pain in left leg: Secondary | ICD-10-CM | POA: Diagnosis not present

## 2017-06-09 DIAGNOSIS — D2272 Melanocytic nevi of left lower limb, including hip: Secondary | ICD-10-CM | POA: Diagnosis not present

## 2017-06-09 DIAGNOSIS — D225 Melanocytic nevi of trunk: Secondary | ICD-10-CM | POA: Diagnosis not present

## 2017-06-09 DIAGNOSIS — X32XXXA Exposure to sunlight, initial encounter: Secondary | ICD-10-CM | POA: Diagnosis not present

## 2017-06-09 DIAGNOSIS — L570 Actinic keratosis: Secondary | ICD-10-CM | POA: Diagnosis not present

## 2017-06-09 DIAGNOSIS — D485 Neoplasm of uncertain behavior of skin: Secondary | ICD-10-CM | POA: Diagnosis not present

## 2017-06-09 DIAGNOSIS — L905 Scar conditions and fibrosis of skin: Secondary | ICD-10-CM | POA: Diagnosis not present

## 2017-06-09 DIAGNOSIS — D2262 Melanocytic nevi of left upper limb, including shoulder: Secondary | ICD-10-CM | POA: Diagnosis not present

## 2017-06-09 DIAGNOSIS — D2261 Melanocytic nevi of right upper limb, including shoulder: Secondary | ICD-10-CM | POA: Diagnosis not present

## 2017-07-11 ENCOUNTER — Other Ambulatory Visit: Payer: Self-pay | Admitting: Family Medicine

## 2017-07-20 DIAGNOSIS — H0289 Other specified disorders of eyelid: Secondary | ICD-10-CM | POA: Diagnosis not present

## 2017-08-15 DIAGNOSIS — H401132 Primary open-angle glaucoma, bilateral, moderate stage: Secondary | ICD-10-CM | POA: Diagnosis not present

## 2017-09-12 ENCOUNTER — Encounter: Payer: Self-pay | Admitting: Family Medicine

## 2017-09-15 DIAGNOSIS — H401132 Primary open-angle glaucoma, bilateral, moderate stage: Secondary | ICD-10-CM | POA: Diagnosis not present

## 2017-10-07 DIAGNOSIS — M5442 Lumbago with sciatica, left side: Secondary | ICD-10-CM | POA: Diagnosis not present

## 2017-10-17 ENCOUNTER — Encounter: Payer: Self-pay | Admitting: Gastroenterology

## 2017-11-01 ENCOUNTER — Encounter: Payer: Self-pay | Admitting: Family Medicine

## 2017-11-01 ENCOUNTER — Ambulatory Visit (INDEPENDENT_AMBULATORY_CARE_PROVIDER_SITE_OTHER): Payer: PPO | Admitting: Family Medicine

## 2017-11-01 ENCOUNTER — Other Ambulatory Visit: Payer: Self-pay

## 2017-11-01 DIAGNOSIS — R1013 Epigastric pain: Secondary | ICD-10-CM

## 2017-11-01 LAB — COMPREHENSIVE METABOLIC PANEL
ALBUMIN: 4.2 g/dL (ref 3.5–5.2)
ALK PHOS: 58 U/L (ref 39–117)
ALT: 21 U/L (ref 0–53)
AST: 20 U/L (ref 0–37)
BILIRUBIN TOTAL: 0.6 mg/dL (ref 0.2–1.2)
BUN: 22 mg/dL (ref 6–23)
CO2: 30 mEq/L (ref 19–32)
Calcium: 9.5 mg/dL (ref 8.4–10.5)
Chloride: 101 mEq/L (ref 96–112)
Creatinine, Ser: 1.33 mg/dL (ref 0.40–1.50)
GFR: 54.47 mL/min — AB (ref >60.00)
GLUCOSE: 117 mg/dL — AB (ref 70–99)
Potassium: 4.6 mEq/L (ref 3.5–5.1)
Sodium: 136 mEq/L (ref 135–145)
Total Protein: 7.4 g/dL (ref 6.0–8.3)

## 2017-11-01 LAB — CBC WITH DIFFERENTIAL/PLATELET
BASOS ABS: 0 10*3/uL (ref 0.0–0.1)
Basophils Relative: 0.1 % (ref 0.0–3.0)
EOS PCT: 0.5 % (ref 0.0–5.0)
Eosinophils Absolute: 0 10*3/uL (ref 0.0–0.7)
HCT: 41.9 % (ref 39.0–52.0)
HEMOGLOBIN: 14.2 g/dL (ref 13.0–17.0)
LYMPHS ABS: 1.3 10*3/uL (ref 0.7–4.0)
LYMPHS PCT: 20 % (ref 12.0–46.0)
MCHC: 33.9 g/dL (ref 30.0–36.0)
MCV: 92.9 fl (ref 78.0–100.0)
MONOS PCT: 14.4 % — AB (ref 3.0–12.0)
Monocytes Absolute: 0.9 10*3/uL (ref 0.1–1.0)
NEUTROS PCT: 65 % (ref 43.0–77.0)
Neutro Abs: 4.2 10*3/uL (ref 1.4–7.7)
Platelets: 224 10*3/uL (ref 150.0–400.0)
RBC: 4.51 Mil/uL (ref 4.22–5.81)
RDW: 13.4 % (ref 11.5–15.5)
WBC: 6.5 10*3/uL (ref 4.0–10.5)

## 2017-11-01 LAB — LIPASE: LIPASE: 24 U/L (ref 11.0–59.0)

## 2017-11-01 MED ORDER — PANTOPRAZOLE SODIUM 40 MG PO TBEC: 40.0000 mg | DELAYED_RELEASE_TABLET | Freq: Every day | ORAL | 1 refills | Status: DC

## 2017-11-01 NOTE — Addendum Note (Signed)
Addended by: Lendon Collar on: 11/01/2017 04:09 PM   Modules accepted: Orders

## 2017-11-01 NOTE — Progress Notes (Signed)
   Subjective:    Patient ID: Dustin Harrison, male    DOB: Jul 11, 1931, 84 y.o.   MRN: [id number]  Abdominal Pain  Pertinent negatives include no arthralgias, dysuria, fever or headaches.     84 year old male presents with new onset epigastric abdominal pain.  He reports noting pain in last  few months. Feels some better after eating, t hen returns before next meal.  Antacid helps some. Occ emesis. No hematemesis, no  Dark stool.  No radiation of pain.  No D/C. No fever.  No GERd, occ burping.  Pain improved after 14 day course of omeprazole 20 mg.  He was using ibuprofen off and on, has now stopped and tylenol for back pain.  No aspirin.    Last endoscopy  2017  nml.  Dr. Loletha Carrow.     Review of Systems  Constitutional: Negative for fatigue and fever.  HENT: Negative for ear pain.   Eyes: Negative for pain.  Respiratory: Negative for cough and shortness of breath.   Cardiovascular: Negative for chest pain, palpitations and leg swelling.  Gastrointestinal: Positive for abdominal pain.  Genitourinary: Negative for dysuria.  Musculoskeletal: Negative for arthralgias.  Neurological: Negative for syncope, light-headedness and headaches.  Psychiatric/Behavioral: Negative for dysphoric mood.       Objective:   Physical Exam  Constitutional: Vital signs are normal. He appears well-developed and well-nourished.  HENT:  Head: Normocephalic.  Right Ear: Hearing normal.  Left Ear: Hearing normal.  Nose: Nose normal.  Mouth/Throat: Oropharynx is clear and moist and mucous membranes are normal.  Neck: Trachea normal. Carotid bruit is not present. No thyroid mass and no thyromegaly present.  Cardiovascular: Normal rate, regular rhythm and normal pulses. Exam reveals no gallop, no distant heart sounds and no friction rub.  No murmur heard. No peripheral edema  Pulmonary/Chest: Effort normal and breath sounds normal. No respiratory distress.  Abdominal: There is tenderness in the  epigastric area. There is no rigidity, no CVA tenderness, no tenderness at McBurney's point and negative Murphy's sign.  Skin: Skin is warm, dry and intact. No rash noted.  Psychiatric: He has a normal mood and affect. His speech is normal and behavior is normal. Thought content normal.          Assessment & Plan:

## 2017-11-01 NOTE — Assessment & Plan Note (Signed)
Most likley gastritis vs PUD.  Eval with labs with lipasse, CMET, cbc and HPylori stool test.  Treat with PPI stronger dose... If not improving will refer to GI for ENDO.   No red flags.

## 2017-11-01 NOTE — Patient Instructions (Signed)
Stop at lab on way out. No further use of ibuprofen or aleve.  Start pantoprazle 40 mg daily for 4 weeks then taper back to OTC omeprazole 20 mg daily x 1 week then every other day then stop.  If symptoms return at any time.. Call for referral to GI for likely endoscopy.

## 2017-11-02 DIAGNOSIS — R1013 Epigastric pain: Secondary | ICD-10-CM | POA: Diagnosis not present

## 2017-11-02 NOTE — Addendum Note (Signed)
Addended by: Ellamae Sia on: 11/02/2017 02:57 PM   Modules accepted: Orders

## 2017-11-03 LAB — HELICOBACTER PYLORI  SPECIAL ANTIGEN
MICRO NUMBER:: 90646634
SPECIMEN QUALITY: ADEQUATE

## 2017-11-07 DIAGNOSIS — M5442 Lumbago with sciatica, left side: Secondary | ICD-10-CM | POA: Diagnosis not present

## 2017-12-01 ENCOUNTER — Ambulatory Visit: Payer: PPO | Admitting: Family Medicine

## 2017-12-27 ENCOUNTER — Telehealth: Payer: Self-pay

## 2017-12-27 DIAGNOSIS — H9190 Unspecified hearing loss, unspecified ear: Secondary | ICD-10-CM

## 2017-12-27 NOTE — Telephone Encounter (Signed)
Unable to reach pt by phone to see what kind of hearing issue pt is having.

## 2017-12-27 NOTE — Telephone Encounter (Signed)
Copied from [place] [id number]. Topic: Referral - Request >> Dec 27, 2017 11:02 AM Antonieta Iba C wrote: Reason for CRM: pt is requesting a referral to [facility] health audiologist on [facility].  Please assist further.

## 2017-12-27 NOTE — Telephone Encounter (Signed)
Called and left Vm for pt to return call to office.  

## 2017-12-28 ENCOUNTER — Other Ambulatory Visit: Payer: Self-pay | Admitting: Family Medicine

## 2017-12-28 NOTE — Telephone Encounter (Signed)
Last office visit 11/01/17/ See office notes Last refill 11/01/17 #30/1

## 2017-12-30 NOTE — Telephone Encounter (Signed)
Called and spoke to patient and was advised that Dr. Diona Browner has already sent the referral order in.

## 2018-01-04 ENCOUNTER — Other Ambulatory Visit: Payer: Self-pay | Admitting: Family Medicine

## 2018-01-27 DIAGNOSIS — H903 Sensorineural hearing loss, bilateral: Secondary | ICD-10-CM | POA: Diagnosis not present

## 2018-01-27 DIAGNOSIS — R7303 Prediabetes: Secondary | ICD-10-CM | POA: Diagnosis not present

## 2018-01-27 DIAGNOSIS — I119 Hypertensive heart disease without heart failure: Secondary | ICD-10-CM | POA: Diagnosis not present

## 2018-02-13 ENCOUNTER — Ambulatory Visit: Payer: PPO | Admitting: Audiology

## 2018-02-15 DIAGNOSIS — X32XXXA Exposure to sunlight, initial encounter: Secondary | ICD-10-CM | POA: Diagnosis not present

## 2018-02-15 DIAGNOSIS — Z85828 Personal history of other malignant neoplasm of skin: Secondary | ICD-10-CM | POA: Diagnosis not present

## 2018-02-15 DIAGNOSIS — C44619 Basal cell carcinoma of skin of left upper limb, including shoulder: Secondary | ICD-10-CM | POA: Diagnosis not present

## 2018-02-15 DIAGNOSIS — L570 Actinic keratosis: Secondary | ICD-10-CM | POA: Diagnosis not present

## 2018-02-15 DIAGNOSIS — Z08 Encounter for follow-up examination after completed treatment for malignant neoplasm: Secondary | ICD-10-CM | POA: Diagnosis not present

## 2018-02-15 DIAGNOSIS — D485 Neoplasm of uncertain behavior of skin: Secondary | ICD-10-CM | POA: Diagnosis not present

## 2018-02-15 DIAGNOSIS — C44329 Squamous cell carcinoma of skin of other parts of face: Secondary | ICD-10-CM | POA: Diagnosis not present

## 2018-03-23 DIAGNOSIS — H401132 Primary open-angle glaucoma, bilateral, moderate stage: Secondary | ICD-10-CM | POA: Diagnosis not present

## 2018-04-03 DIAGNOSIS — H903 Sensorineural hearing loss, bilateral: Secondary | ICD-10-CM

## 2018-04-03 NOTE — Telephone Encounter (Signed)
Copied from [place] [id number]. Topic: Referral - Request for Referral >> Apr 03, 2018  8:47 AM Bea Graff, NT wrote: Has patient seen PCP for this complaint? Yes.   *If NO, is insurance requiring patient see PCP for this issue before PCP can refer them? Referral for which specialty: Otolaryngology  Preferred provider/office: [facility]  Reason for referral: CT and surgery for cochlear implant. Pt states his appt is scheduled for the CT on 04/17/18 @ 1 pm.  His surgeon appt is scheduled for Dr. Norva Pavlov on 04/17/18 @ 2:45 pm.

## 2018-04-07 ENCOUNTER — Other Ambulatory Visit: Payer: Self-pay | Admitting: Family Medicine

## 2018-04-07 DIAGNOSIS — H903 Sensorineural hearing loss, bilateral: Secondary | ICD-10-CM

## 2018-04-07 NOTE — Progress Notes (Signed)
Ordered CT

## 2018-04-13 ENCOUNTER — Ambulatory Visit
Admission: RE | Admit: 2018-04-13 | Discharge: 2018-04-13 | Disposition: A | Discharge status: Home / Self Care | Payer: PPO | Source: Ambulatory Visit | Attending: Family Medicine | Admitting: Family Medicine

## 2018-04-13 DIAGNOSIS — H903 Sensorineural hearing loss, bilateral: Secondary | ICD-10-CM | POA: Diagnosis not present

## 2018-04-13 DIAGNOSIS — H9190 Unspecified hearing loss, unspecified ear: Secondary | ICD-10-CM | POA: Diagnosis not present

## 2018-04-13 IMAGING — CT CT TEMPORAL BONES W/O CM
3 of 6 series · 15 of 40 positions shown, 18 images · non-contrast
Comparison: None.

CLINICAL DATA: Planning for cochlear implant. Worsening hearing
loss.

EXAM:
CT TEMPORAL BONES WITHOUT CONTRAST
TECHNIQUE: Axial and coronal plane CT imaging of the petrous temporal bones was
performed with thin-collimation image reconstruction. No intravenous
contrast was administered. Multiplanar CT image reconstructions were
also generated.

[Series 3: ax soft temperal bones · axial · 0.33mm/px · z∈[-605,-583]mm · 2 of 34 slices shown]
[im 12/34  brain]
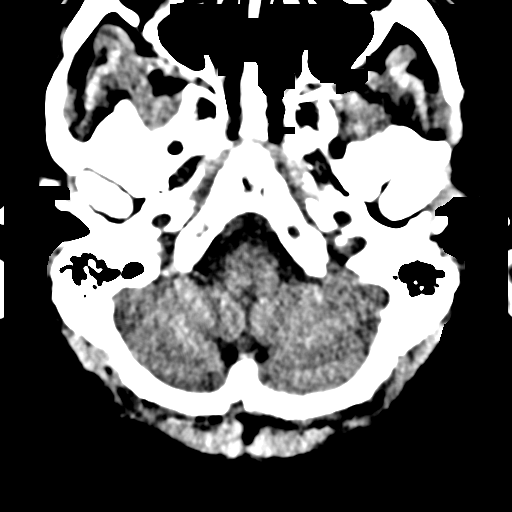
[im 23/34  brain]
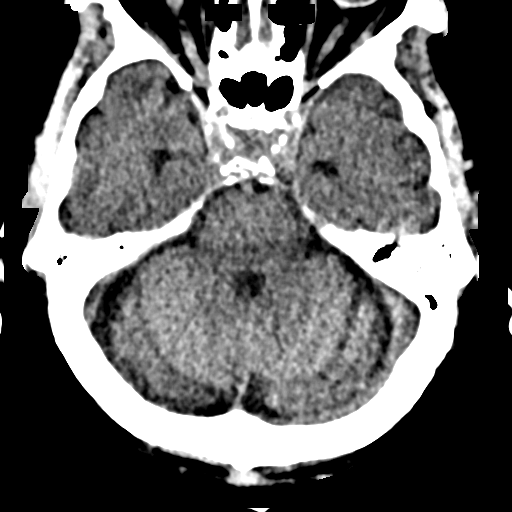

[Series 8: ax mag right temperal bones · axial · 0.20mm/px · z∈[-622,-565]mm · 11 of 114 slices shown, 14 images]
[im 10/114  brain]
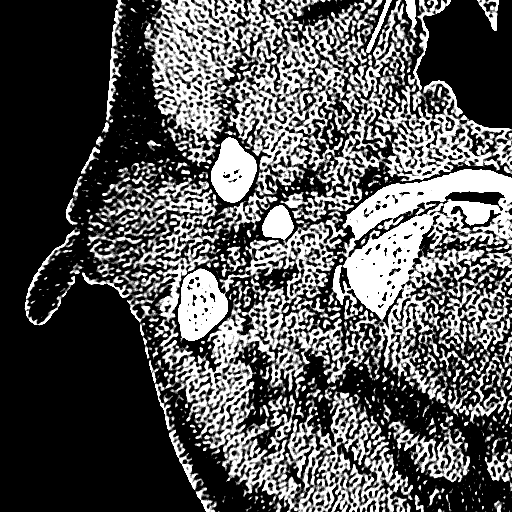
[im 10/114  bone]
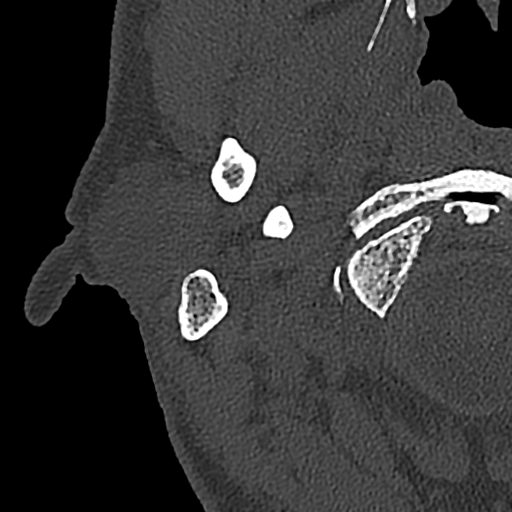
[im 19/114  bone]
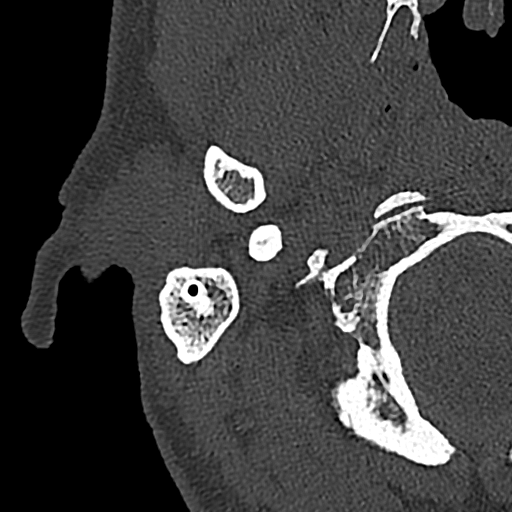
[im 29/114  bone]
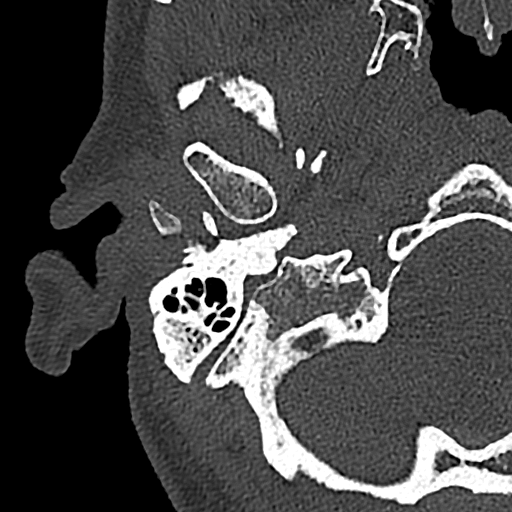
[im 38/114  bone]
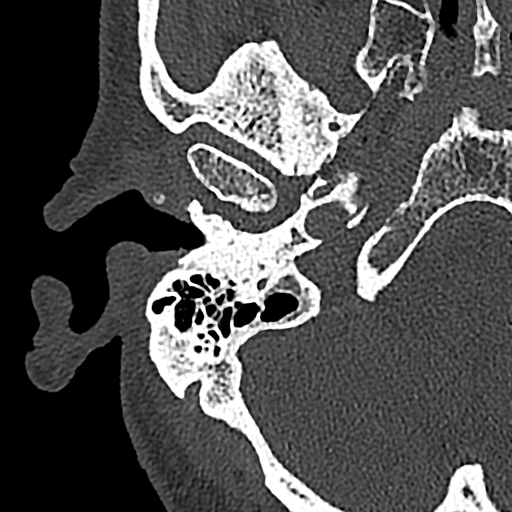
[im 48/114  brain]
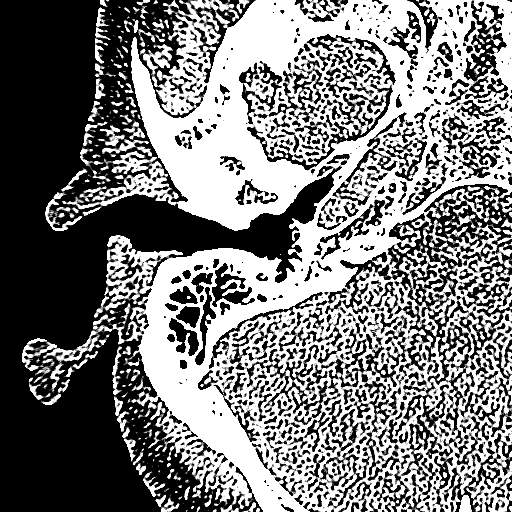
[im 48/114  bone]
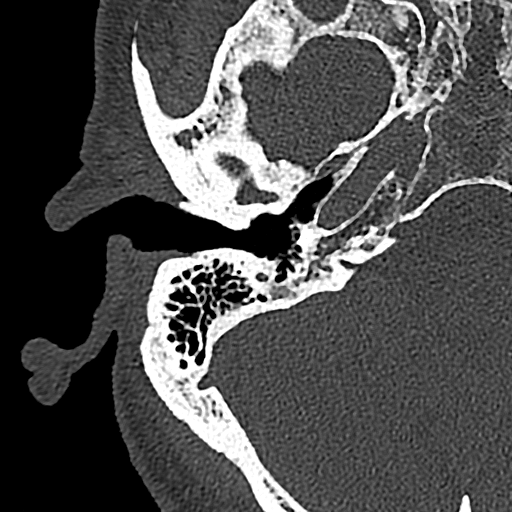
[im 57/114  bone]
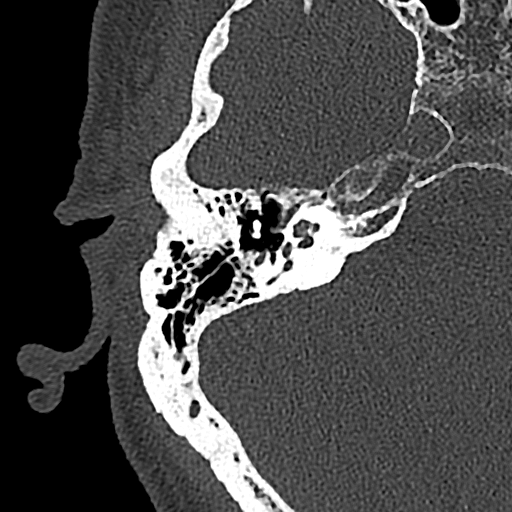
[im 66/114  bone]
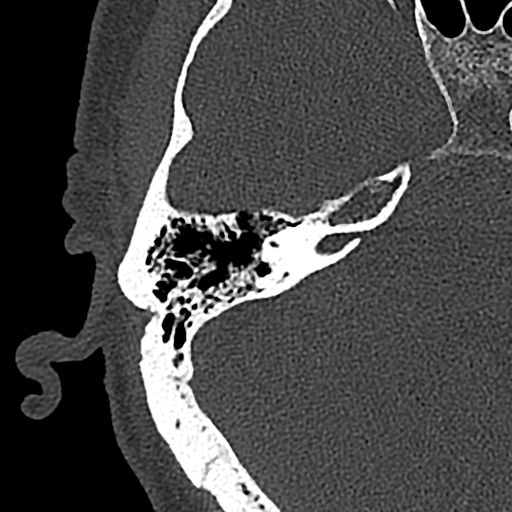
[im 76/114  bone]
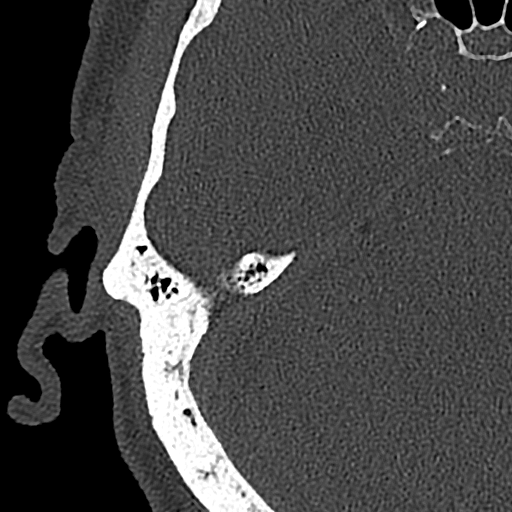
[im 85/114  brain]
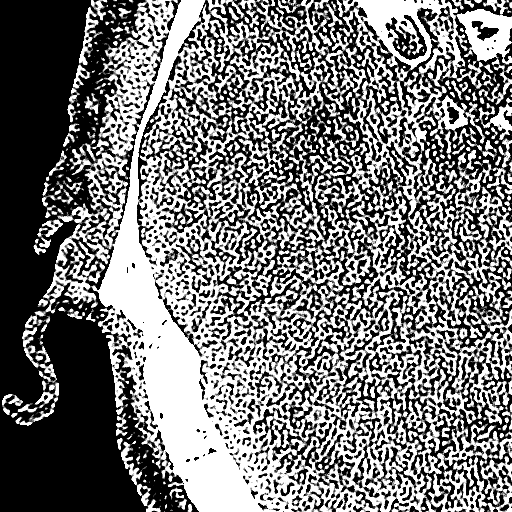
[im 85/114  bone]
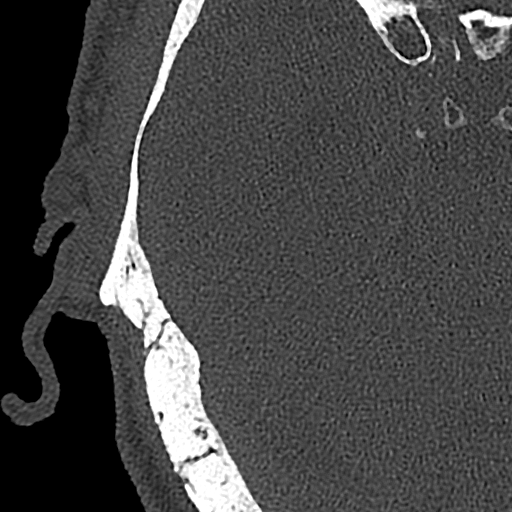
[im 95/114  bone]
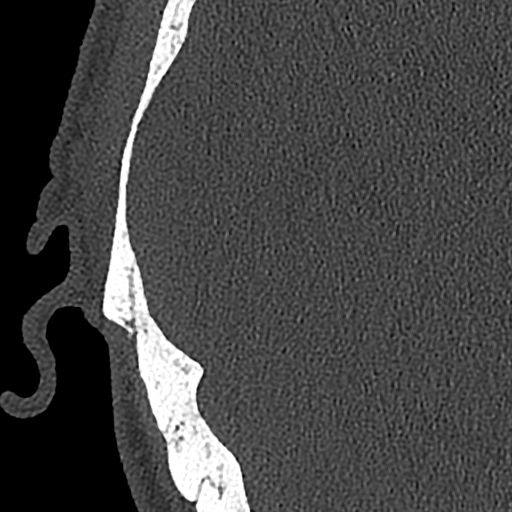
[im 104/114  bone]
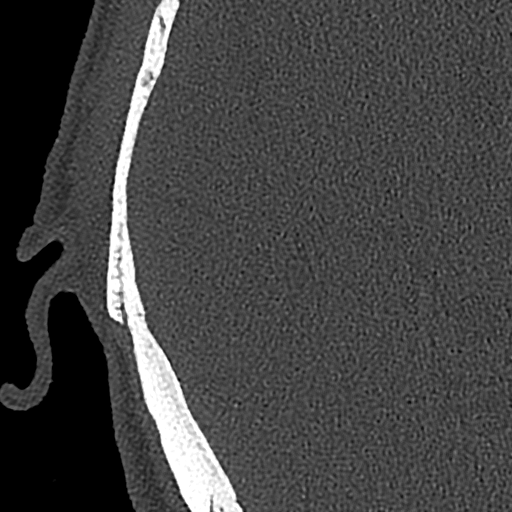

[Series 10: cor mag right temperal bones · coronal · 0.13mm/px · 2 of 125 slices shown]
[im 42/125  bone]
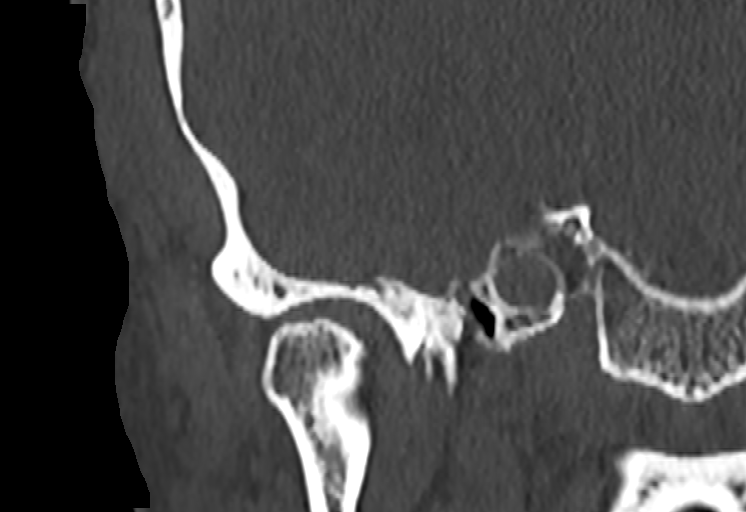
[im 83/125  bone]
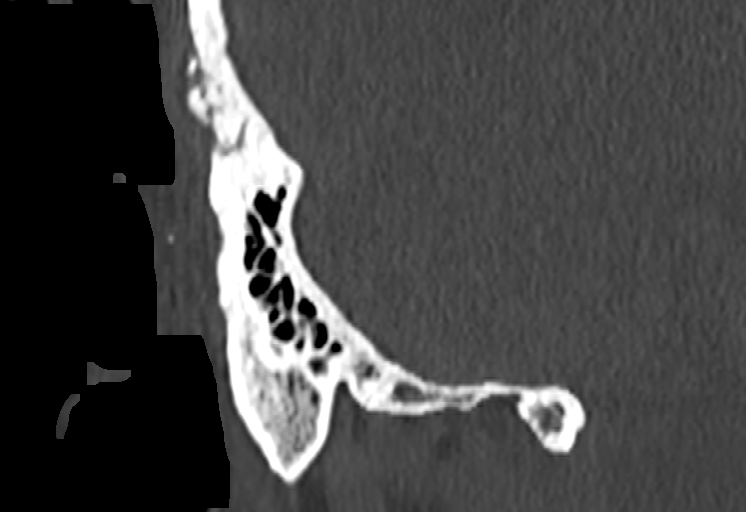

[15 of 40 positions shown; findings below may reference images not displayed]

FINDINGS: Normal BILATERAL external ears. No middle ear or mastoid fluid or
coalescence. Normal ossicles.

Normal BILATERAL cochlea, with 2 [DATE] turns. Normal BILATERAL
vestibule. Normal semicircular canals. Unremarkable internal
auditory canals. No abnormality of the bone surrounding the inner
ear structures.

Intracranial compartment demonstrates moderate atrophy not
unexpected for age. Vascular calcification is seen in the carotid
siphons. Calvarium and temporal bones appear intact. No significant
sinus disease. BILATERAL TMJ degenerative change.
IMPRESSION: Unremarkable CT temporal bone without contrast. No variant anatomy,
unexpected inflammatory process, or visible barriers to cochlear
implant.

## 2018-04-17 DIAGNOSIS — H903 Sensorineural hearing loss, bilateral: Secondary | ICD-10-CM | POA: Diagnosis not present

## 2018-04-19 DIAGNOSIS — C44729 Squamous cell carcinoma of skin of left lower limb, including hip: Secondary | ICD-10-CM | POA: Diagnosis not present

## 2018-04-19 DIAGNOSIS — C44329 Squamous cell carcinoma of skin of other parts of face: Secondary | ICD-10-CM | POA: Diagnosis not present

## 2018-04-19 DIAGNOSIS — R208 Other disturbances of skin sensation: Secondary | ICD-10-CM | POA: Diagnosis not present

## 2018-04-26 ENCOUNTER — Telehealth: Payer: Self-pay | Admitting: Family Medicine

## 2018-04-26 DIAGNOSIS — C44619 Basal cell carcinoma of skin of left upper limb, including shoulder: Secondary | ICD-10-CM | POA: Diagnosis not present

## 2018-04-26 NOTE — Telephone Encounter (Signed)
Tried calling pt  If pt calls back please r/s 12/6 appointment with dr Diona Browner.  Provider out of office

## 2018-05-10 ENCOUNTER — Telehealth: Payer: Self-pay | Admitting: Family Medicine

## 2018-05-10 ENCOUNTER — Ambulatory Visit (INDEPENDENT_AMBULATORY_CARE_PROVIDER_SITE_OTHER): Payer: PPO

## 2018-05-10 ENCOUNTER — Ambulatory Visit: Payer: Medicare Other

## 2018-05-10 VITALS — BP 140/90 | HR 45 | Temp 97.9°F | Ht 72.5 in | Wt 201.2 lb

## 2018-05-10 DIAGNOSIS — E119 Type 2 diabetes mellitus without complications: Secondary | ICD-10-CM | POA: Diagnosis not present

## 2018-05-10 DIAGNOSIS — I10 Essential (primary) hypertension: Secondary | ICD-10-CM

## 2018-05-10 DIAGNOSIS — Z125 Encounter for screening for malignant neoplasm of prostate: Secondary | ICD-10-CM | POA: Diagnosis not present

## 2018-05-10 DIAGNOSIS — Z0000 Encounter for general adult medical examination without abnormal findings: Secondary | ICD-10-CM

## 2018-05-10 DIAGNOSIS — E7800 Pure hypercholesterolemia, unspecified: Secondary | ICD-10-CM | POA: Diagnosis not present

## 2018-05-10 NOTE — Progress Notes (Signed)
PCP notes:   Health maintenance:  A1C - future lab order placed  Abnormal screenings:   Mini-Cog score: 19/20 MMSE - Mini Mental State Exam 05/10/2018 04/27/2017  Orientation to time 5 5  Orientation to Place 5 5  Registration 3 3  Attention/ Calculation 0 0  Recall 2 3  Recall-comments unable to recall 1 of 3 words -  Language- name 2 objects 0 0  Language- repeat 1 1  Language- follow 3 step command 3 3  Language- read & follow direction 0 0  Write a sentence 0 0  Copy design 0 0  Total score 19 20    Patient concerns:   None  Nurse concerns:  None  Next PCP appt:   05/18/18 @ 1545

## 2018-05-10 NOTE — Patient Instructions (Signed)
Dustin Harrison , Thank you for taking time to come for your Medicare Wellness Visit. I appreciate your ongoing commitment to your health goals. Please review the following plan we discussed and let me know if I can assist you in the future.   These are the goals we discussed: Goals    . Increase physical activity     Starting 05/10/2018, I will continue to exercise for 15 minutes 5 days per week.        This is a list of the screening recommended for you and due dates:  Health Maintenance  Topic Date Due  . Hemoglobin A1C  05/12/2018*  . Complete foot exam   05/18/2018*  . Eye exam for diabetics  03/24/2019  . Tetanus Vaccine  09/06/2023  . Flu Shot  Completed  . Pneumonia vaccines  Completed  *Topic was postponed. The date shown is not the original due date.   Preventive Care for Adults  A healthy lifestyle and preventive care can promote health and wellness. Preventive health guidelines for adults include the following key practices.  . A routine yearly physical is a good way to check with your health care provider about your health and preventive screening. It is a chance to share any concerns and updates on your health and to receive a thorough exam.  . Visit your dentist for a routine exam and preventive care every 6 months. Brush your teeth twice a day and floss once a day. Good oral hygiene prevents tooth decay and gum disease.  . The frequency of eye exams is based on your age, health, family medical history, use  of contact lenses, and other factors. Follow your health care provider's recommendations for frequency of eye exams.  . Eat a healthy diet. Foods like vegetables, fruits, whole grains, low-fat dairy products, and lean protein foods contain the nutrients you need without too many calories. Decrease your intake of foods high in solid fats, added sugars, and salt. Eat the right amount of calories for you. Get information about a proper diet from your health care provider,  if necessary.  . Regular physical exercise is one of the most important things you can do for your health. Most adults should get at least 150 minutes of moderate-intensity exercise (any activity that increases your heart rate and causes you to sweat) each week. In addition, most adults need muscle-strengthening exercises on 2 or more days a week.  Silver Sneakers may be a benefit available to you. To determine eligibility, you may visit the website: www.silversneakers.com or contact program at [phone] Mon-Fri between 8AM-8PM.   . Maintain a healthy weight. The body mass index (BMI) is a screening tool to identify possible weight problems. It provides an estimate of body fat based on height and weight. Your health care provider can find your BMI and can help you achieve or maintain a healthy weight.   For adults 20 years and older: ? A BMI below 18.5 is considered underweight. ? A BMI of 18.5 to 24.9 is normal. ? A BMI of 25 to 29.9 is considered overweight. ? A BMI of 30 and above is considered obese.   . Maintain normal blood lipids and cholesterol levels by exercising and minimizing your intake of saturated fat. Eat a balanced diet with plenty of fruit and vegetables. Blood tests for lipids and cholesterol should begin at age 47 and be repeated every 5 years. If your lipid or cholesterol levels are high, you are over 50, or you  are at high risk for heart disease, you may need your cholesterol levels checked more frequently. Ongoing high lipid and cholesterol levels should be treated with medicines if diet and exercise are not working.  . If you smoke, find out from your health care provider how to quit. If you do not use tobacco, please do not start.  . If you choose to drink alcohol, please do not consume more than 2 drinks per day. One drink is considered to be 12 ounces (355 mL) of beer, 5 ounces (148 mL) of wine, or 1.5 ounces (44 mL) of liquor.  . If you are 38-23 years old, ask  your health care provider if you should take aspirin to prevent strokes.  . Use sunscreen. Apply sunscreen liberally and repeatedly throughout the day. You should seek shade when your shadow is shorter than you. Protect yourself by wearing long sleeves, pants, a wide-brimmed hat, and sunglasses year round, whenever you are outdoors.  . Once a month, do a whole body skin exam, using a mirror to look at the skin on your back. Tell your health care provider of new moles, moles that have irregular borders, moles that are larger than a pencil eraser, or moles that have changed in shape or color.

## 2018-05-10 NOTE — Progress Notes (Signed)
Subjective:   Cheyne Boulden is a 84 y.o. male who presents for Medicare Annual/Subsequent preventive examination.  Review of Systems:  N/A Cardiac Risk Factors include: advanced age (>56men, >94 women);male gender;dyslipidemia;hypertension     Objective:    Vitals: BP 140/90 (BP Location: Left Arm, Patient Position: Sitting, Cuff Size: Normal)   Pulse (!) 45   Temp 97.9 F (36.6 C) (Oral)   Ht 6' 0.5" (1.842 m) Comment: shoes  Wt 201 lb 4 oz (91.3 kg)   SpO2 100%   BMI 26.92 kg/m   Body mass index is 26.92 kg/m.  Advanced Directives 05/10/2018 04/27/2017 04/23/2016 11/14/2015 10/08/2015  Does Patient Have a Medical Advance Directive? Yes Yes Yes Yes Yes  Type of Paramedic of [place];Living will [place];Living will [place];Living will [place];Living will [place];Living will  Copy of [place] in Chart? No - copy requested No - copy requested No - copy requested - No - copy requested    Tobacco Social History   Tobacco Use  Smoking Status Never Smoker  Smokeless Tobacco Never Used     Counseling given: No   Clinical Intake:  Pre-visit preparation completed: Yes  Pain : No/denies pain Pain Score: 0-No pain     Nutritional Status: BMI 25 -29 Overweight Nutritional Risks: None Diabetes: Yes CBG done?: No Did pt. bring in CBG monitor from home?: No  How often do you need to have someone help you when you read instructions, pamphlets, or other written materials from your doctor or pharmacy?: 1 - Never What is the last grade level you completed in school?: PhD in Chemistry  Interpreter Needed?: No  Comments: pt lives with spouse Information entered by :: LPinson, LPN  Past Medical History:  Diagnosis Date  . Abnormal glucose   . Arthritis   . Baker's cyst of knee    left  . BPH (benign prostatic hypertrophy)   . Cough    because of "tight" esophagus  . Diabetes mellitus   . Glaucoma   . HOH (hard of hearing)   . Hypertension   . Pheochromocytoma of right adrenal gland   . Ulcer   . Wears hearing aid    bilateral   Past Surgical History:  Procedure Laterality Date  . adrenaletomy    . CATARACT EXTRACTION W/PHACO Left 10/08/2015   Procedure: CATARACT EXTRACTION PHACO AND INTRAOCULAR LENS PLACEMENT ([place]) left eye;  Surgeon: Leandrew Koyanagi, MD;  Location: [facility];  Service: Ophthalmology;  Laterality: Left;  MALYUGIN  . HERNIA REPAIR    . SQUAMOUS CELL CARCINOMA EXCISION  03-2006   left ear  . TONSILLECTOMY     Family History  Problem Relation Age of Onset  . Alcohol abuse Mother   . Coronary artery disease Mother   . Brain cancer Father        tumor  . Diabetes Brother    Social History   Socioeconomic History  . Marital status: Married    Spouse name: Not on file  . Number of children: Not on file  . Years of education: Not on file  . Highest education level: Not on file  Occupational History  . Occupation: Clinical biochemist  Social Needs  . Financial resource strain: Not on file  . Food insecurity:    Worry: Not on file    Inability: Not on file  . Transportation needs:    Medical: Not on  file    Non-medical: Not on file  Tobacco Use  . Smoking status: Never Smoker  . Smokeless tobacco: Never Used  Substance and Sexual Activity  . Alcohol use: Yes    Alcohol/week: 7.0 standard drinks    Types: 7 Glasses of wine per week  . Drug use: No  . Sexual activity: Not Currently  Lifestyle  . Physical activity:    Days per week: Not on file    Minutes per session: Not on file  . Stress: Not on file  Relationships  . Social connections:    Talks on phone: Not on file    Gets together: Not on file    Attends religious service: Not on file    Active member of club or organization: Not on file    Attends meetings of clubs or organizations: Not on file    Relationship status:  Not on file  Other Topics Concern  . Not on file  Social History Narrative   Regular exercise- yes- 3-4 times a week treadmill/walk   Diet: fruits and veggies,water   Living will, HCPOA (wife) , full code (reviewed 2015)    Outpatient Encounter Medications as of 05/10/2018  Medication Sig  . acetaminophen (TYLENOL) 500 MG tablet Take 500 mg by mouth daily as needed.  Marland Kitchen atorvastatin (LIPITOR) 20 MG tablet Take 1 tablet (20 mg total) by mouth daily.  . bimatoprost (LUMIGAN) 0.01 % SOLN Place 1 drop into both eyes daily.   . finasteride (PROSCAR) 5 MG tablet TAKE 1 TABLET BY MOUTH DAILY  . IBUPROFEN PO Take 400 mg by mouth daily as needed.   Marland Kitchen losartan (COZAAR) 100 MG tablet TAKE ONE TABLET BY MOUTH EVERY DAY  . Psyllium (METAMUCIL FIBER PO) Take 2 Scoops by mouth daily.  . tamsulosin (FLOMAX) 0.4 MG CAPS capsule TAKE 1 CAPSULE EVERY DAY  . timolol (TIMOPTIC) 0.5 % ophthalmic solution Place 1 drop into both eyes 2 (two) times daily.   . [DISCONTINUED] calcium elemental as carbonate (BARIATRIC TUMS ULTRA) 400 MG chewable tablet Chew 1,000 mg by mouth daily as needed for heartburn.  . [DISCONTINUED] omeprazole (PRILOSEC) 20 MG capsule Take 20 mg by mouth daily as needed.   . [DISCONTINUED] pantoprazole (PROTONIX) 40 MG tablet TAKE ONE TABLET EVERY DAY  . latanoprost (XALATAN) 0.005 % ophthalmic solution    No facility-administered encounter medications on file as of 05/10/2018.     Activities of Daily Living In your present state of health, do you have any difficulty performing the following activities: 05/10/2018  Hearing? Y  Vision? Y  Comment glaucoma  Difficulty concentrating or making decisions? N  Walking or climbing stairs? N  Dressing or bathing? N  Doing errands, shopping? N  Preparing Food and eating ? N  Using the Toilet? N  In the past six months, have you accidently leaked urine? N  Do you have problems with loss of bowel control? N  Managing your Medications? N    Managing your Finances? N  Housekeeping or managing your Housekeeping? N  Some recent data might be hidden    Patient Care Team: Jinny Sanders, MD as PCP - General Leandrew Koyanagi, MD as Referring Physician (Ophthalmology) Dasher, Rayvon Char, MD as Referring Physician (Dermatology)   Assessment:   This is a routine wellness examination for Doris.  Hearing Screening Comments: Bilateral hearing aids Vision Screening Comments: Vision exam in Oct 2019 with Dr. Wallace Going  Exercise Activities and Dietary recommendations Current Exercise Habits: Home exercise  routine, Type of exercise: Other - see comments(step machine), Time (Minutes): 15, Frequency (Times/Week): 5, Weekly Exercise (Minutes/Week): 75, Intensity: Mild, Exercise limited by: None identified  Goals    . Increase physical activity     Starting 05/10/2018, I will continue to exercise for 15 minutes 5 days per week.        Fall Risk Fall Risk  05/10/2018 04/27/2017 04/23/2016 04/15/2015 01/17/2014  Falls in the past year? 0 No No No No   Depression Screen PHQ 2/9 Scores 05/10/2018 04/27/2017 04/23/2016 04/15/2015  PHQ - 2 Score 0 0 0 0  PHQ- 9 Score 0 0 - -    Cognitive Function MMSE - Mini Mental State Exam 05/10/2018 04/27/2017  Orientation to time 5 5  Orientation to Place 5 5  Registration 3 3  Attention/ Calculation 0 0  Recall 2 3  Recall-comments unable to recall 1 of 3 words -  Language- name 2 objects 0 0  Language- repeat 1 1  Language- follow 3 step command 3 3  Language- read & follow direction 0 0  Write a sentence 0 0  Copy design 0 0  Total score 19 20     PLEASE NOTE: A Mini-Cog screen was completed. Maximum score is 20. A value of 0 denotes this part of [place] MMSE was not completed or the patient failed this part of the Mini-Cog screening.   Mini-Cog Screening Orientation to Time - Max 5 pts Orientation to Place - Max 5 pts Registration - Max 3 pts Recall - Max 3 pts Language Repeat -  Max 1 pts Language Follow 3 Step Command - Max 3 pts     Immunization History  Administered Date(s) Administered  . Influenza Whole 02/25/2009, 01/30/2010, 02/11/2012  . Influenza, High Dose Seasonal PF 01/25/2015, 02/17/2016, 03/03/2017, 03/06/2018  . Influenza-Unspecified 01/09/2013  . Pneumococcal Conjugate-13 01/17/2014  . Pneumococcal Polysaccharide-23 03/07/2006  . Td 11/08/2006  . Tdap 09/05/2013  . Zoster 12/16/2005  . Zoster Recombinat (Shingrix) 08/27/2016, 11/24/2016    Screening Tests Health Maintenance  Topic Date Due  . HEMOGLOBIN A1C  05/12/2018 (Originally 10/25/2017)  . FOOT EXAM  05/18/2018 (Originally 05/10/2018)  . OPHTHALMOLOGY EXAM  03/24/2019  . TETANUS/TDAP  09/06/2023  . INFLUENZA VACCINE  Completed  . PNA vac Low Risk Adult  Completed       Plan:     I have personally reviewed, addressed, and noted the following in the patient's chart:  A. Medical and social history B. Use of alcohol, tobacco or illicit drugs  C. Current medications and supplements D. Functional ability and status E.  Nutritional status F.  Physical activity G. Advance directives H. List of other physicians I.  Hospitalizations, surgeries, and ER visits in previous 12 months J.  [place] to include hearing, vision, cognitive, depression L. Referrals and appointments - none  In addition, I have reviewed and discussed with patient certain preventive protocols, quality metrics, and best practice recommendations. A written personalized care plan for preventive services as well as general preventive health recommendations were provided to patient.  See attached scanned questionnaire for additional information.   Signed,   Lindell Noe, MHA, BS, LPN Health Coach

## 2018-05-10 NOTE — Telephone Encounter (Signed)
-----   Message from Eustace Pen, LPN sent at [phone]  3:22 PM EST ----- Regarding: Labs 12/4 Lab orders needed. Thank you.  Insurance:  [employer]

## 2018-05-11 ENCOUNTER — Other Ambulatory Visit: Payer: Self-pay | Admitting: *Deleted

## 2018-05-11 MED ORDER — LOSARTAN POTASSIUM 100 MG PO TABS: 100.0000 mg | ORAL_TABLET | Freq: Every day | ORAL | 3 refills | Status: DC

## 2018-05-11 MED ORDER — ATORVASTATIN CALCIUM 20 MG PO TABS: 20.0000 mg | ORAL_TABLET | Freq: Every day | ORAL | 3 refills | Status: DC

## 2018-05-11 MED ORDER — TAMSULOSIN HCL 0.4 MG PO CAPS: 0.4000 mg | ORAL_CAPSULE | Freq: Every day | ORAL | 3 refills | Status: DC

## 2018-05-11 MED ORDER — FINASTERIDE 5 MG PO TABS: 5.0000 mg | ORAL_TABLET | Freq: Every day | ORAL | 3 refills | Status: DC

## 2018-05-11 NOTE — Progress Notes (Signed)
I reviewed health advisor's note, was available for consultation, and agree with documentation and plan.  

## 2018-05-12 ENCOUNTER — Encounter: Payer: Medicare Other | Admitting: Family Medicine

## 2018-05-12 ENCOUNTER — Other Ambulatory Visit (INDEPENDENT_AMBULATORY_CARE_PROVIDER_SITE_OTHER): Payer: PPO

## 2018-05-12 DIAGNOSIS — E7800 Pure hypercholesterolemia, unspecified: Secondary | ICD-10-CM | POA: Diagnosis not present

## 2018-05-12 DIAGNOSIS — E119 Type 2 diabetes mellitus without complications: Secondary | ICD-10-CM | POA: Diagnosis not present

## 2018-05-12 DIAGNOSIS — I10 Essential (primary) hypertension: Secondary | ICD-10-CM | POA: Diagnosis not present

## 2018-05-12 DIAGNOSIS — Z125 Encounter for screening for malignant neoplasm of prostate: Secondary | ICD-10-CM

## 2018-05-12 LAB — COMPREHENSIVE METABOLIC PANEL
ALT: 24 U/L (ref 0–53)
AST: 24 U/L (ref 0–37)
Albumin: 4.5 g/dL (ref 3.5–5.2)
Alkaline Phosphatase: 57 U/L (ref 39–117)
BILIRUBIN TOTAL: 0.8 mg/dL (ref 0.2–1.2)
BUN: 30 mg/dL — ABNORMAL HIGH (ref 6–23)
CO2: 30 mEq/L (ref 19–32)
Calcium: 9.7 mg/dL (ref 8.4–10.5)
Chloride: 101 mEq/L (ref 96–112)
Creatinine, Ser: 1.4 mg/dL (ref 0.40–1.50)
GFR: 51.27 mL/min — ABNORMAL LOW (ref >60.00)
Glucose, Bld: 130 mg/dL — ABNORMAL HIGH (ref 70–99)
Potassium: 4.5 mEq/L (ref 3.5–5.1)
Sodium: 138 mEq/L (ref 135–145)
Total Protein: 7.1 g/dL (ref 6.0–8.3)

## 2018-05-12 LAB — HEMOGLOBIN A1C: Hgb A1c MFr Bld: 6.8 % — ABNORMAL HIGH (ref 4.6–6.5)

## 2018-05-12 LAB — LIPID PANEL
CHOL/HDL RATIO: 2
Cholesterol: 181 mg/dL (ref 0–200)
HDL: 86.5 mg/dL (ref >39.00)
LDL Cholesterol: 82 mg/dL (ref 0–99)
NonHDL: 94.56
Triglycerides: 64 mg/dL (ref 0.0–149.0)
VLDL: 12.8 mg/dL (ref 0.0–40.0)

## 2018-05-12 LAB — PSA, MEDICARE: PSA: 1.26 ng/ml (ref 0.10–4.00)

## 2018-05-18 ENCOUNTER — Ambulatory Visit (INDEPENDENT_AMBULATORY_CARE_PROVIDER_SITE_OTHER): Payer: PPO | Admitting: Family Medicine

## 2018-05-18 ENCOUNTER — Encounter: Payer: Self-pay | Admitting: Family Medicine

## 2018-05-18 VITALS — BP 104/60 | HR 60 | Temp 98.3°F | Ht 72.5 in | Wt 200.8 lb

## 2018-05-18 DIAGNOSIS — I10 Essential (primary) hypertension: Secondary | ICD-10-CM

## 2018-05-18 DIAGNOSIS — E7800 Pure hypercholesterolemia, unspecified: Secondary | ICD-10-CM

## 2018-05-18 DIAGNOSIS — N182 Chronic kidney disease, stage 2 (mild): Secondary | ICD-10-CM | POA: Diagnosis not present

## 2018-05-18 DIAGNOSIS — H903 Sensorineural hearing loss, bilateral: Secondary | ICD-10-CM

## 2018-05-18 DIAGNOSIS — Z0000 Encounter for general adult medical examination without abnormal findings: Secondary | ICD-10-CM | POA: Diagnosis not present

## 2018-05-18 DIAGNOSIS — E119 Type 2 diabetes mellitus without complications: Secondary | ICD-10-CM | POA: Diagnosis not present

## 2018-05-18 DIAGNOSIS — N401 Enlarged prostate with lower urinary tract symptoms: Secondary | ICD-10-CM | POA: Diagnosis not present

## 2018-05-18 MED ORDER — TAMSULOSIN HCL 0.4 MG PO CAPS: 0.8000 mg | ORAL_CAPSULE | Freq: Every day | ORAL | 11 refills | Status: DC

## 2018-05-18 NOTE — Assessment & Plan Note (Signed)
Now cholesterol at goal LDL < 100 on moderate dose statin.

## 2018-05-18 NOTE — Assessment & Plan Note (Signed)
Gradually increasing  A1C.Marland Kitchen  Pt will continue dietary changes.Marland Kitchen discussed in deatial at today's OV. Conintue active lifestlye and exercise. If goes above A1C of 7-8 will discuss metformin  or SGLT2i. initiation.

## 2018-05-18 NOTE — Assessment & Plan Note (Signed)
Stable control . Discussed avoid dehydration, NSAIDs nephrotoxic meds.

## 2018-05-18 NOTE — Assessment & Plan Note (Signed)
Well controlled. Continue current medication.  

## 2018-05-18 NOTE — Assessment & Plan Note (Signed)
Significant nocturia and daytime sleepiness. Trial of increased dose of flomax to 0.8 mg daily.

## 2018-05-18 NOTE — Patient Instructions (Addendum)
Try trial of two capsules daily of  flomax to help with urinary issue at night. Work on low carbohydrate diet.Marland Kitchen stop bananas. Continue with exercise. Call if coughing with meals bothersome.. to consider swallowing study.

## 2018-05-18 NOTE — Progress Notes (Signed)
Subjective:    Patient ID: Dustin Harrison, male    DOB: 1931/07/16, 84 y.o.   MRN: [id number]  HPI  The patient presents for complete physical and review of chronic health problems. He/She also has the following acute concerns today:  The patient saw Dustin Musa, LPN for medicare wellness. Note reviewed in detail and important notes copied below. Health maintenance:  A1C - future lab order placed  Abnormal screenings:   Mini-Cog score: 19/20 MMSE - Mini Mental State Exam 05/10/2018 04/27/2017  Orientation to time 5 5  Orientation to Place 5 5  Registration 3 3  Attention/ Calculation 0 0  Recall 2 3  Recall-comments unable to recall 1 of 3 words -  Language- name 2 objects 0 0  Language- repeat 1 1  Language- follow 3 step command 3 3  Language- read & follow direction 0 0  Write a sentence 0 0  Copy design 0 0  Total score 19 20    Patient concerns:   None  05/18/18 Today ' back pain has improve with home PT.  Diabetes:   Good control on diet control but gradaully improving. Lab Results  Component Value Date   HGBA1C 6.8 (H) 05/12/2018  Using medications without difficulties: Hypoglycemic episodes:none Hyperglycemic episodes:none Feet problems:no ulcers Blood Sugars averaging: not checking eye exam within last year: 03/2018  Elevated Cholesterol:  LDL at goal < 100 on atorvastatin 20 mg daily.. has dropped from 154 last year. Lab Results  Component Value Date   CHOL 181 05/12/2018   HDL 86.50 05/12/2018   LDLCALC 82 05/12/2018   LDLDIRECT 126.2 04/05/2013   TRIG 64.0 05/12/2018   CHOLHDL 2 05/12/2018  Using medications without problems:NONE Muscle aches: NONE Diet compliance: Exercise: yes 3-45 times a week. Other complaints:  Hypertension:    Good control on losartan  Using medication without problems or lightheadedness:  none Chest pain with exertion:none Edema:none Short of breath:none Average home BPs: Other issues:  CKD: on  ARB  BPH on proscar and flomax.. poor control.  Waking up every hour to sleep.    Coughing when eating breakfast.. no pain or trouble swallowing.   Planned cochlear implant  surgery1/07/2018 Social History /Family History/Past Medical History reviewed in detail and updated in EMR if needed. Blood pressure 104/60, pulse 60, temperature 98.3 F (36.8 C), temperature source Oral, height 6' 0.5" (1.842 m), weight 200 lb 12 oz (91.1 kg).   Review of Systems  Constitutional: Negative for fatigue and fever.  HENT: Negative for ear pain.   Eyes: Negative for pain.  Respiratory: Negative for cough and shortness of breath.   Cardiovascular: Negative for chest pain, palpitations and leg swelling.  Gastrointestinal: Negative for abdominal pain.  Genitourinary: Negative for dysuria.  Musculoskeletal: Negative for arthralgias.  Neurological: Negative for syncope, light-headedness and headaches.  Psychiatric/Behavioral: Negative for dysphoric mood.       Objective:   Physical Exam Constitutional:      General: He is not in acute distress.    Appearance: Normal appearance. He is well-developed. He is not ill-appearing or toxic-appearing.  HENT:     Head: Normocephalic and atraumatic.     Right Ear: Hearing, tympanic membrane, ear canal and external ear normal.     Left Ear: Hearing, tympanic membrane, ear canal and external ear normal.     Nose: Nose normal.     Mouth/Throat:     Pharynx: Uvula midline.  Eyes:     General: Lids are normal.  Lids are everted, no foreign bodies appreciated.     Conjunctiva/sclera: Conjunctivae normal.     Pupils: Pupils are equal, round, and reactive to light.  Neck:     Musculoskeletal: Normal range of motion and neck supple.     Thyroid: No thyroid mass or thyromegaly.     Vascular: No carotid bruit.     Trachea: Trachea and phonation normal.  Cardiovascular:     Rate and Rhythm: Normal rate and regular rhythm.     Pulses: Normal pulses.     Heart  sounds: S1 normal and S2 normal. No murmur. No gallop.   Pulmonary:     Breath sounds: Normal breath sounds. No wheezing, rhonchi or rales.  Abdominal:     General: Bowel sounds are normal.     Palpations: Abdomen is soft.     Tenderness: There is no abdominal tenderness. There is no guarding or rebound.     Hernia: No hernia is present.  Lymphadenopathy:     Cervical: No cervical adenopathy.  Skin:    General: Skin is warm and dry.     Findings: No rash.  Neurological:     Mental Status: He is alert.     Cranial Nerves: No cranial nerve deficit.     Sensory: No sensory deficit.     Gait: Gait normal.     Deep Tendon Reflexes: Reflexes are normal and symmetric.  Psychiatric:        Speech: Speech normal.        Behavior: Behavior normal.        Judgment: Judgment normal.       Diabetic foot exam: Normal inspection No skin breakdown No calluses  Normal DP pulses Normal sensation to light touch and monofilament Nails normal     Assessment & Plan:  The patient's preventative maintenance and recommended screening tests for an annual wellness exam were reviewed in full today. Brought up to date unless services declined.  Counselled on the importance of diet, exercise, and its role in overall health and mortality. The patient's FH and SH was reviewed, including their home life, tobacco status, and drug and alcohol status.   Vaccines:uptodate Prostate Cancer Screen:PSA every year per pt request    Lab Results  Component Value Date   PSA 1.26 05/12/2018   PSA 1.21 04/27/2017   PSA 0.91 04/16/2016        Colon Cancer Screen:2009, Dustin Harrison,      Smoking Status:none

## 2018-05-24 DIAGNOSIS — C44729 Squamous cell carcinoma of skin of left lower limb, including hip: Secondary | ICD-10-CM | POA: Diagnosis not present

## 2018-06-27 DIAGNOSIS — Z9621 Cochlear implant status: Secondary | ICD-10-CM

## 2018-06-27 HISTORY — PX: COCHLEAR IMPLANT: SUR684

## 2018-06-27 HISTORY — DX: Cochlear implant status: Z96.21

## 2018-09-14 ENCOUNTER — Emergency Department (HOSPITAL_COMMUNITY): Admission: EM | Admit: 2018-09-14 | Payer: Medicare Other | Source: Home / Self Care

## 2018-09-14 MED ORDER — ALTEPLASE (STROKE) FULL DOSE INFUSION
90.0000 mg | Freq: Once | INTRAVENOUS | Status: DC
  Filled 2018-09-14: qty 100

## 2018-09-14 MED ORDER — SODIUM CHLORIDE 0.9 % IV SOLN: 50.0000 mL | Freq: Once | INTRAVENOUS | Status: DC

## 2018-11-08 ENCOUNTER — Telehealth: Payer: Self-pay | Admitting: Family Medicine

## 2018-11-08 DIAGNOSIS — E119 Type 2 diabetes mellitus without complications: Secondary | ICD-10-CM

## 2018-11-08 NOTE — Telephone Encounter (Signed)
-----   Message from Ellamae Sia sent at 11/06/2018 11:58 AM EDT ----- Regarding: Lab orders for Thursday,6.4.20 Lab orders for a 6 month follow up appt

## 2018-11-09 ENCOUNTER — Other Ambulatory Visit: Payer: PPO

## 2018-11-09 ENCOUNTER — Other Ambulatory Visit (INDEPENDENT_AMBULATORY_CARE_PROVIDER_SITE_OTHER): Payer: Medicare Other

## 2018-11-09 DIAGNOSIS — E119 Type 2 diabetes mellitus without complications: Secondary | ICD-10-CM

## 2018-11-09 LAB — COMPREHENSIVE METABOLIC PANEL
ALT: 20 U/L (ref 0–53)
AST: 20 U/L (ref 0–37)
Albumin: 4.1 g/dL (ref 3.5–5.2)
Alkaline Phosphatase: 56 U/L (ref 39–117)
BUN: 23 mg/dL (ref 6–23)
CO2: 28 mEq/L (ref 19–32)
Calcium: 9.1 mg/dL (ref 8.4–10.5)
Chloride: 103 mEq/L (ref 96–112)
Creatinine, Ser: 1.35 mg/dL (ref 0.40–1.50)
GFR: 50.25 mL/min — ABNORMAL LOW (ref >60.00)
Glucose, Bld: 129 mg/dL — ABNORMAL HIGH (ref 70–99)
Potassium: 4.4 mEq/L (ref 3.5–5.1)
Sodium: 139 mEq/L (ref 135–145)
Total Bilirubin: 0.9 mg/dL (ref 0.2–1.2)
Total Protein: 6.4 g/dL (ref 6.0–8.3)

## 2018-11-09 LAB — HEMOGLOBIN A1C: Hgb A1c MFr Bld: 6.8 % — ABNORMAL HIGH (ref 4.6–6.5)

## 2018-11-09 LAB — LIPID PANEL
Cholesterol: 173 mg/dL (ref 0–200)
HDL: 86.1 mg/dL (ref >39.00)
LDL Cholesterol: 77 mg/dL (ref 0–99)
NonHDL: 86.63
Total CHOL/HDL Ratio: 2
Triglycerides: 47 mg/dL (ref 0.0–149.0)
VLDL: 9.4 mg/dL (ref 0.0–40.0)

## 2018-11-16 ENCOUNTER — Encounter: Payer: Self-pay | Admitting: Family Medicine

## 2018-11-16 ENCOUNTER — Ambulatory Visit (INDEPENDENT_AMBULATORY_CARE_PROVIDER_SITE_OTHER): Payer: Medicare Other | Admitting: Family Medicine

## 2018-11-16 VITALS — BP 148/74 | HR 51 | Ht 72.5 in | Wt 191.5 lb

## 2018-11-16 DIAGNOSIS — E7800 Pure hypercholesterolemia, unspecified: Secondary | ICD-10-CM | POA: Diagnosis not present

## 2018-11-16 DIAGNOSIS — I10 Essential (primary) hypertension: Secondary | ICD-10-CM | POA: Diagnosis not present

## 2018-11-16 DIAGNOSIS — E119 Type 2 diabetes mellitus without complications: Secondary | ICD-10-CM | POA: Diagnosis not present

## 2018-11-16 DIAGNOSIS — N182 Chronic kidney disease, stage 2 (mild): Secondary | ICD-10-CM

## 2018-11-16 NOTE — Assessment & Plan Note (Signed)
Stable .. likely due to HTN, DM and age

## 2018-11-16 NOTE — Assessment & Plan Note (Signed)
Well controlled. Continue current medication.  

## 2018-11-16 NOTE — Assessment & Plan Note (Signed)
Diet controlled. Encouraged exercise, weight loss, healthy eating habits.  

## 2018-11-16 NOTE — Progress Notes (Signed)
VIRTUAL VISIT Due to national recommendations of social distancing due to [place] 19, a virtual visit is felt to be most appropriate for this patient at this time.   I connected with the patient on 11/16/18 at 11:00 AM EDT by virtual telehealth platform and verified that I am speaking with the correct person using two identifiers.   I discussed the limitations, risks, security and privacy concerns of performing an evaluation and management service by  virtual telehealth platform and the availability of in person appointments. I also discussed with the patient that there may be a patient responsible charge related to this service. The patient expressed understanding and agreed to proceed.  Patient location: Home Provider Location: [facility] Participants: Eliezer Lofts and Elicia Lamp   Chief Complaint  Patient presents with  . Diabetes    History of Present Illness: 85 year old  Male presents for follow up Dm  Diabetes: Good control on diet control. Lab Results  Component Value Date   HGBA1C 6.8 (H) 11/09/2018  Using medications without difficulties: Hypoglycemic episodes: Hyperglycemic episodes: Feet problems:no ulcer Blood Sugars averaging: eye exam within last year:yes  Hypertension:   Tolerable control on losartan BP Readings from Last 3 Encounters:  11/16/18 (!) 148/74  05/18/18 104/60  05/10/18 140/90  Using medication without problems or lightheadedness: none Chest pain with exertion:none Edema:none Short of breath:none Average home BPs: Other issues: Stable CKD GFR 50, slight decline  Elevated Cholesterol:  Lab Results  Component Value Date   CHOL 173 11/09/2018   HDL 86.10 11/09/2018   LDLCALC 77 11/09/2018   LDLDIRECT 126.2 04/05/2013   TRIG 47.0 11/09/2018   CHOLHDL 2 11/09/2018  Using medications without problems: Muscle aches:  Diet compliance: healthy Exercise:walking, not golfing lately Other complaints:   COVID 19 screen No  recent travel or known exposure to [place] The patient denies respiratory symptoms of COVID 19 at this time.  The importance of social distancing was discussed today.   Review of Systems  Constitutional: Negative for chills and fever.  HENT: Negative for congestion and ear pain.   Eyes: Negative for pain and redness.  Respiratory: Negative for cough and shortness of breath.   Cardiovascular: Negative for chest pain, palpitations and leg swelling.  Gastrointestinal: Negative for abdominal pain, blood in stool, constipation, diarrhea, nausea and vomiting.  Genitourinary: Negative for dysuria.  Musculoskeletal: Negative for falls and myalgias.  Skin: Negative for rash.  Neurological: Negative for dizziness.  Psychiatric/Behavioral: Negative for depression. The patient is not nervous/anxious.       Past Medical History:  Diagnosis Date  . Abnormal glucose   . Arthritis   . Baker's cyst of knee    left  . BPH (benign prostatic hypertrophy)   . Cough    because of "tight" esophagus  . Diabetes mellitus   . Glaucoma   . HOH (hard of hearing)   . Hypertension   . Pheochromocytoma of right adrenal gland   . Ulcer   . Wears hearing aid    bilateral    reports that he has never smoked. He has never used smokeless tobacco. He reports current alcohol use of about 7.0 standard drinks of alcohol per week. He reports that he does not use drugs.   Current Outpatient Medications:  .  acetaminophen (TYLENOL) 500 MG tablet, Take 500 mg by mouth daily as needed., Disp: , Rfl:  .  atorvastatin (LIPITOR) 20 MG tablet, Take 1 tablet (20 mg total) by mouth daily.,  Disp: 90 tablet, Rfl: 3 .  bimatoprost (LUMIGAN) 0.01 % SOLN, Place 1 drop into both eyes daily. , Disp: , Rfl:  .  finasteride (PROSCAR) 5 MG tablet, Take 1 tablet (5 mg total) by mouth daily., Disp: 90 tablet, Rfl: 3 .  latanoprost (XALATAN) 0.005 % ophthalmic solution, , Disp: , Rfl:  .  losartan (COZAAR) 100 MG tablet, Take 1 tablet  (100 mg total) by mouth daily., Disp: 90 tablet, Rfl: 3 .  Psyllium (METAMUCIL FIBER PO), Take 2 Scoops by mouth daily., Disp: , Rfl:  .  tamsulosin (FLOMAX) 0.4 MG CAPS capsule, Take 2 capsules (0.8 mg total) by mouth daily., Disp: 60 capsule, Rfl: 11 .  timolol (TIMOPTIC) 0.5 % ophthalmic solution, Place 1 drop into both eyes 2 (two) times daily. , Disp: , Rfl:    Observations/Objective: Blood pressure (!) 148/74, pulse (!) 51, height 6' 0.5" (1.842 m), weight 191 lb 8 oz (86.9 kg).  Physical Exam  Physical Exam Constitutional:      General: The patient is not in acute distress. Pulmonary:     Effort: Pulmonary effort is normal. No respiratory distress.  Neurological:     Mental Status: The patient is alert and oriented to person, place, and time.  Psychiatric:        Mood and Affect: Mood normal.        Behavior: Behavior normal.   Assessment and Plan Essential hypertension, benign Well controlled. Continue current medication.   Diabetes mellitus without complication ([place]) Diet controlled. Encouraged exercise, weight loss, healthy eating habits.   CKD (chronic kidney disease) stage 2, GFR 60-89 ml/min Stable .. likely due to HTN, DM and age     I discussed the assessment and treatment plan with the patient. The patient was provided an opportunity to ask questions and all were answered. The patient agreed with the plan and demonstrated an understanding of the instructions.   The patient was advised to call back or seek an in-person evaluation if the symptoms worsen or if the condition fails to improve as anticipated.     Eliezer Lofts, MD

## 2018-11-23 LAB — HM DIABETES EYE EXAM

## 2018-11-30 LAB — HM DIABETES EYE EXAM

## 2019-02-01 ENCOUNTER — Other Ambulatory Visit: Payer: Self-pay | Admitting: Family Medicine

## 2019-03-06 ENCOUNTER — Other Ambulatory Visit: Payer: Self-pay | Admitting: Family Medicine

## 2019-05-06 ENCOUNTER — Other Ambulatory Visit: Payer: Self-pay | Admitting: Family Medicine

## 2019-05-17 ENCOUNTER — Ambulatory Visit: Payer: Medicare Other

## 2019-05-17 ENCOUNTER — Other Ambulatory Visit: Payer: Self-pay

## 2019-05-17 ENCOUNTER — Telehealth: Payer: Self-pay | Admitting: Family Medicine

## 2019-05-17 ENCOUNTER — Ambulatory Visit: Payer: PPO

## 2019-05-17 DIAGNOSIS — E119 Type 2 diabetes mellitus without complications: Secondary | ICD-10-CM

## 2019-05-17 DIAGNOSIS — Z125 Encounter for screening for malignant neoplasm of prostate: Secondary | ICD-10-CM

## 2019-05-17 NOTE — Telephone Encounter (Signed)
-----   Message from Ellamae Sia sent at 05/14/2019 10:34 AM EST ----- Regarding: lab orders for Friday, 12.11.20 Patient is scheduled for CPX labs, please order future labs, Thanks , Karna Christmas

## 2019-05-18 ENCOUNTER — Other Ambulatory Visit: Payer: Self-pay

## 2019-05-18 ENCOUNTER — Other Ambulatory Visit (INDEPENDENT_AMBULATORY_CARE_PROVIDER_SITE_OTHER): Payer: Medicare Other

## 2019-05-18 DIAGNOSIS — Z125 Encounter for screening for malignant neoplasm of prostate: Secondary | ICD-10-CM

## 2019-05-18 DIAGNOSIS — E119 Type 2 diabetes mellitus without complications: Secondary | ICD-10-CM | POA: Diagnosis not present

## 2019-05-18 LAB — COMPREHENSIVE METABOLIC PANEL
ALT: 22 U/L (ref 0–53)
AST: 22 U/L (ref 0–37)
Albumin: 4.1 g/dL (ref 3.5–5.2)
Alkaline Phosphatase: 62 U/L (ref 39–117)
BUN: 23 mg/dL (ref 6–23)
CO2: 30 mEq/L (ref 19–32)
Calcium: 9 mg/dL (ref 8.4–10.5)
Chloride: 103 mEq/L (ref 96–112)
Creatinine, Ser: 1.4 mg/dL (ref 0.40–1.50)
GFR: 48.12 mL/min — ABNORMAL LOW (ref >60.00)
Glucose, Bld: 118 mg/dL — ABNORMAL HIGH (ref 70–99)
Potassium: 4.3 mEq/L (ref 3.5–5.1)
Sodium: 139 mEq/L (ref 135–145)
Total Bilirubin: 1 mg/dL (ref 0.2–1.2)
Total Protein: 6.4 g/dL (ref 6.0–8.3)

## 2019-05-18 LAB — HEMOGLOBIN A1C: Hgb A1c MFr Bld: 6.6 % — ABNORMAL HIGH (ref 4.6–6.5)

## 2019-05-18 LAB — PSA, MEDICARE: PSA: 0.93 ng/ml (ref 0.10–4.00)

## 2019-05-18 NOTE — Progress Notes (Signed)
No critical labs need to be addressed urgently. We will discuss labs in detail at upcoming office visit.   

## 2019-05-25 ENCOUNTER — Other Ambulatory Visit: Payer: Self-pay

## 2019-05-25 ENCOUNTER — Ambulatory Visit (INDEPENDENT_AMBULATORY_CARE_PROVIDER_SITE_OTHER): Payer: Medicare Other | Admitting: Family Medicine

## 2019-05-25 ENCOUNTER — Encounter: Payer: Self-pay | Admitting: Family Medicine

## 2019-05-25 ENCOUNTER — Encounter: Payer: PPO | Admitting: Family Medicine

## 2019-05-25 VITALS — BP 120/80 | HR 49 | Temp 99.4°F | Ht 71.0 in | Wt 199.0 lb

## 2019-05-25 DIAGNOSIS — Z0000 Encounter for general adult medical examination without abnormal findings: Secondary | ICD-10-CM | POA: Diagnosis not present

## 2019-05-25 DIAGNOSIS — M542 Cervicalgia: Secondary | ICD-10-CM | POA: Insufficient documentation

## 2019-05-25 DIAGNOSIS — N401 Enlarged prostate with lower urinary tract symptoms: Secondary | ICD-10-CM

## 2019-05-25 DIAGNOSIS — I10 Essential (primary) hypertension: Secondary | ICD-10-CM

## 2019-05-25 DIAGNOSIS — E7800 Pure hypercholesterolemia, unspecified: Secondary | ICD-10-CM

## 2019-05-25 DIAGNOSIS — E119 Type 2 diabetes mellitus without complications: Secondary | ICD-10-CM | POA: Diagnosis not present

## 2019-05-25 DIAGNOSIS — N182 Chronic kidney disease, stage 2 (mild): Secondary | ICD-10-CM

## 2019-05-25 LAB — HM DIABETES FOOT EXAM

## 2019-05-25 MED ORDER — PREDNISONE 20 MG PO TABS: ORAL_TABLET | ORAL | 0 refills | Status: DC

## 2019-05-25 NOTE — Assessment & Plan Note (Signed)
Well controlled. Continue current medication. On statin. 

## 2019-05-25 NOTE — Assessment & Plan Note (Signed)
Well controlled. Continue current medication. On ARB. 

## 2019-05-25 NOTE — Patient Instructions (Signed)
Complete prednisone taper.   Limit repetitive range of motion of neck.  Heat on neck.  Follow up if not improving in 2 weeks.

## 2019-05-25 NOTE — Assessment & Plan Note (Signed)
well controlled with tamsulosin, proscar.

## 2019-05-25 NOTE — Addendum Note (Signed)
Addended by: Carter Kitten on: 05/25/2019 10:09 AM   Modules accepted: Orders

## 2019-05-25 NOTE — Progress Notes (Signed)
Chief Complaint  Patient presents with  . Medicare Wellness    History of Present Illness: HPI   The patient presents for annual medicare wellness, complete physical and review of chronic health problems. He/She also has the following acute concerns today:  new onset pain in  Left neck and left shoulder.  no pain in arm, no numbness.  no weakness in left arm.  increased pain with moving neck. No fall, no injury.  Request a referral to PT.   Using aspercreme, tylenol.. helps minimally.   I have personally reviewed the Medicare Annual Wellness questionnaire and have noted 1. The patient's medical and social history 2. Their use of alcohol, tobacco or illicit drugs 3. Their current medications and supplements 4. The patient's functional ability including ADL's, fall risks, home safety risks and hearing or visual             impairment. 5. Diet and physical activities 6. Evidence for depression or mood disorders 7.         Updated provider list Cognitive evaluation was performed and recorded on pt medicare questionnaire form. The patients weight, height, BMI and visual acuity have been recorded in the chart  I have made referrals, counseling and provided education to the patient based review of the above and I have provided the pt with a written personalized care plan for preventive services.   Documentation of this information was scanned into the electronic record under the media tab.   Advance directives and end of life planning reviewed in detail with patient and documented in EMR. Patient given handout on advance care directives if needed. HCPOA and living will updated if needed.   Hearing Screening   125Hz  250Hz  500Hz  1000Hz  2000Hz  3000Hz  4000Hz  6000Hz  8000Hz   Right ear:           Left ear:           Comments: Wears Bilateral Hearing Aides  Vision Screening Comments: Eye Exam with Dr. Wallace Going -Sees eye doctor every 6 months   Fall Risk  05/25/2019 05/10/2018  04/27/2017 04/23/2016 04/15/2015  Falls in the past year? 0 0 No No No    PHQ9 SCORE ONLY 05/25/2019 05/10/2018 04/27/2017  Score 0 0 0    Diabetes:  Diet controlled. Lab Results  Component Value Date   HGBA1C 6.6 (H) 05/18/2019  Using medications without difficulties: Hypoglycemic episodes:none Hyperglycemic episodes:none Feet problems: no ulcer Blood Sugars averaging: FBS 90-100 eye exam within last year: yes  Elevated Cholesterol: At goal on statin Lab Results  Component Value Date   CHOL 173 11/09/2018   HDL 86.10 11/09/2018   LDLCALC 77 11/09/2018   LDLDIRECT 126.2 04/05/2013   TRIG 47.0 11/09/2018   CHOLHDL 2 11/09/2018  Using medications without problems: Muscle aches:  Diet compliance: done Exercise: 4 times a week Other complaints:  Hypertension:   At goal on losartan dialy BP Readings from Last 3 Encounters:  05/25/19 120/80  11/16/18 (!) 148/74  05/18/18 104/60  Using medication without problems or lightheadedness: none Chest pain with exertion:none Edema:none Short of breath:none Average home BPs: Other issues:  BPH well controlled with tamsulosin, proscar.  This visit occurred during the SARS-CoV-2 public health emergency.  Safety protocols were in place, including screening questions prior to the visit, additional usage of staff PPE, and extensive cleaning of exam room while observing appropriate contact time as indicated for disinfecting solutions.   COVID 19 screen:  No recent travel or known exposure to [place] The patient denies  respiratory symptoms of COVID 19 at this time. The importance of social distancing was discussed today.     Review of Systems  Constitutional: Negative for chills and fever.  HENT: Negative for congestion and ear pain.   Eyes: Negative for pain and redness.  Respiratory: Negative for cough and shortness of breath.   Cardiovascular: Negative for chest pain, palpitations and leg swelling.  Gastrointestinal: Negative  for abdominal pain, blood in stool, constipation, diarrhea, nausea and vomiting.  Genitourinary: Negative for dysuria.  Musculoskeletal: Negative for falls and myalgias.  Skin: Negative for rash.  Neurological: Negative for dizziness.  Psychiatric/Behavioral: Negative for depression. The patient is not nervous/anxious.       Past Medical History:  Diagnosis Date  . Abnormal glucose   . Arthritis   . Baker's cyst of knee    left  . BPH (benign prostatic hypertrophy)   . Cough    because of "tight" esophagus  . Diabetes mellitus   . Glaucoma   . HOH (hard of hearing)   . Hypertension   . Pheochromocytoma of right adrenal gland   . Ulcer   . Wears hearing aid    bilateral    reports that he has never smoked. He has never used smokeless tobacco. He reports current alcohol use of about 7.0 standard drinks of alcohol per week. He reports that he does not use drugs.   Current Outpatient Medications:  .  acetaminophen (TYLENOL) 500 MG tablet, Take 500 mg by mouth daily as needed., Disp: , Rfl:  .  atorvastatin (LIPITOR) 20 MG tablet, TAKE ONE TABLET EVERY DAY, Disp: 90 tablet, Rfl: 1 .  bimatoprost (LUMIGAN) 0.01 % SOLN, Place 1 drop into both eyes daily. , Disp: , Rfl:  .  finasteride (PROSCAR) 5 MG tablet, TAKE ONE TABLET EVERY DAY, Disp: 90 tablet, Rfl: 1 .  latanoprost (XALATAN) 0.005 % ophthalmic solution, , Disp: , Rfl:  .  losartan (COZAAR) 100 MG tablet, TAKE ONE TABLET EVERY DAY, Disp: 90 tablet, Rfl: 1 .  Psyllium (METAMUCIL FIBER PO), Take 2 Scoops by mouth daily., Disp: , Rfl:  .  tamsulosin (FLOMAX) 0.4 MG CAPS capsule, TAKE 2 CAPSULES EVERY DAY, Disp: 180 capsule, Rfl: 0 .  timolol (TIMOPTIC) 0.5 % ophthalmic solution, Place 1 drop into both eyes 2 (two) times daily. , Disp: , Rfl:    Observations/Objective: Blood pressure 120/80, pulse (!) 49, temperature 99.4 F (37.4 C), temperature source Temporal, height 5\' 11"  (1.803 m), weight 199 lb (90.3 kg), SpO2 98  %.  Physical Exam Constitutional:      General: He is not in acute distress.    Appearance: Normal appearance. He is well-developed. He is not ill-appearing or toxic-appearing.  HENT:     Head: Normocephalic and atraumatic.     Right Ear: Hearing, tympanic membrane, ear canal and external ear normal.     Left Ear: Hearing, tympanic membrane, ear canal and external ear normal.     Nose: Nose normal.     Mouth/Throat:     Pharynx: Uvula midline.  Eyes:     General: Lids are normal. Lids are everted, no foreign bodies appreciated.     Conjunctiva/sclera: Conjunctivae normal.     Pupils: Pupils are equal, round, and reactive to light.  Neck:     Thyroid: No thyroid mass or thyromegaly.     Vascular: No carotid bruit.     Trachea: Trachea and phonation normal.  Cardiovascular:     Rate and Rhythm:  Normal rate and regular rhythm.     Pulses: Normal pulses.     Heart sounds: S1 normal and S2 normal. No murmur. No gallop.   Pulmonary:     Breath sounds: Normal breath sounds. No wheezing, rhonchi or rales.  Abdominal:     General: Bowel sounds are normal.     Palpations: Abdomen is soft.     Tenderness: There is no abdominal tenderness. There is no guarding or rebound.     Hernia: No hernia is present.  Musculoskeletal:     Right shoulder: No tenderness or bony tenderness. Normal range of motion.     Left shoulder: No tenderness or bony tenderness. Normal range of motion.     Cervical back: Rigidity and tenderness present. No torticollis. Muscular tenderness present. No spinous process tenderness. Decreased range of motion.  Lymphadenopathy:     Cervical: No cervical adenopathy.  Skin:    General: Skin is warm and dry.     Findings: No rash.  Neurological:     Mental Status: He is alert.     Cranial Nerves: No cranial nerve deficit.     Sensory: No sensory deficit.     Gait: Gait normal.     Deep Tendon Reflexes: Reflexes are normal and symmetric.  Psychiatric:        Speech:  Speech normal.        Behavior: Behavior normal.        Judgment: Judgment normal.       Diabetic foot exam: Normal inspection No skin breakdown No calluses  Normal DP pulses Normal sensation to light touch and monofilament Nails normal  Assessment and Plan The patient's preventative maintenance and recommended screening tests for an annual wellness exam were reviewed in full today. Brought up to date unless services declined.  Counselled on the importance of diet, exercise, and its role in overall health and mortality. The patient's FH and SH was reviewed, including their home life, tobacco status, and drug and alcohol status.   Will Stop PSA.Marland Kitchen pt is agreeable.  Uptodatee with vaccines   Diabetes mellitus without complication ([place]) Diet controlled. Encouraged exercise, weight loss, healthy eating habits.   CKD (chronic kidney disease) stage 2, GFR 60-89 ml/min Stable.. continue good DM and HTN control  HYPERCHOLESTEROLEMIA Well controlled. Continue current medication.  On statin  Essential hypertension, benign Well controlled. Continue current medication. On ARB.  BPH (benign prostatic hyperplasia) well controlled with tamsulosin, proscar.   Acute neck pain Neg spurling.. not cleraly nerve impingement. Likely PA in neck and muscle spasm. Treat with prednisone ( avoif NSAIDs given CKD)     Eliezer Lofts, MD

## 2019-05-25 NOTE — Assessment & Plan Note (Signed)
Neg spurling.. not cleraly nerve impingement. Likely PA in neck and muscle spasm. Treat with prednisone ( avoif NSAIDs given CKD)

## 2019-05-25 NOTE — Assessment & Plan Note (Signed)
Diet controlled. Encouraged exercise, weight loss, healthy eating habits.  

## 2019-05-25 NOTE — Assessment & Plan Note (Signed)
Stable.. continue good DM and HTN control

## 2019-05-27 NOTE — Telephone Encounter (Signed)
Will send to Dr Diona Browner as Juluis Rainier

## 2019-05-28 ENCOUNTER — Telehealth: Payer: Self-pay | Admitting: Family Medicine

## 2019-05-28 NOTE — Telephone Encounter (Signed)
Spoke with Dustin Harrison and discussed his diabetes diagnosis.  He was diagnosed last year when his A1c came back at 6.8.  He is diet controlled.  Patient and wife states understanding. I called Darlene back at [facility] to advise her of the same.  She will make a note in his chart.

## 2019-05-28 NOTE — Telephone Encounter (Signed)
Darlene with [facility] is calling in regards to a request they received for the patient to have a diabetic eye exam done. She stated that the patient is not aware that his has diabetes.  The patient has an appointment tomorrow and they would like [place] to reach out to the patient to make sure he is aware that he does have diabetes.     Phone- [phone] [facility])  [facility] main #[phone]

## 2019-05-29 LAB — HM DIABETES EYE EXAM

## 2019-06-06 ENCOUNTER — Encounter: Payer: Self-pay | Admitting: Family Medicine

## 2019-07-10 DIAGNOSIS — H903 Sensorineural hearing loss, bilateral: Secondary | ICD-10-CM | POA: Diagnosis not present

## 2019-07-13 DIAGNOSIS — Z85828 Personal history of other malignant neoplasm of skin: Secondary | ICD-10-CM | POA: Diagnosis not present

## 2019-07-13 DIAGNOSIS — L570 Actinic keratosis: Secondary | ICD-10-CM | POA: Diagnosis not present

## 2019-07-13 DIAGNOSIS — D2271 Melanocytic nevi of right lower limb, including hip: Secondary | ICD-10-CM | POA: Diagnosis not present

## 2019-07-13 DIAGNOSIS — L821 Other seborrheic keratosis: Secondary | ICD-10-CM | POA: Diagnosis not present

## 2019-07-13 DIAGNOSIS — D2261 Melanocytic nevi of right upper limb, including shoulder: Secondary | ICD-10-CM | POA: Diagnosis not present

## 2019-07-13 DIAGNOSIS — X32XXXA Exposure to sunlight, initial encounter: Secondary | ICD-10-CM | POA: Diagnosis not present

## 2019-07-13 DIAGNOSIS — D2262 Melanocytic nevi of left upper limb, including shoulder: Secondary | ICD-10-CM | POA: Diagnosis not present

## 2019-07-31 ENCOUNTER — Other Ambulatory Visit: Payer: Self-pay | Admitting: Family Medicine

## 2019-10-06 DIAGNOSIS — Z8673 Personal history of transient ischemic attack (TIA), and cerebral infarction without residual deficits: Secondary | ICD-10-CM

## 2019-10-06 DIAGNOSIS — J3801 Paralysis of vocal cords and larynx, unilateral: Secondary | ICD-10-CM

## 2019-10-06 HISTORY — DX: Personal history of transient ischemic attack (TIA), and cerebral infarction without residual deficits: Z86.73

## 2019-10-06 HISTORY — DX: Paralysis of vocal cords and larynx, unilateral: J38.01

## 2019-10-07 ENCOUNTER — Encounter: Payer: Self-pay | Admitting: Internal Medicine

## 2019-10-07 ENCOUNTER — Emergency Department: Payer: Medicare PPO

## 2019-10-07 ENCOUNTER — Other Ambulatory Visit: Payer: Self-pay

## 2019-10-07 ENCOUNTER — Inpatient Hospital Stay
Admission: EM | Admit: 2019-10-07 | Discharge: 2019-10-16 | DRG: 853 | Disposition: A | Discharge status: Skilled Nursing Facility | Payer: Medicare PPO | Attending: Internal Medicine | Admitting: Internal Medicine

## 2019-10-07 DIAGNOSIS — Z7401 Bed confinement status: Secondary | ICD-10-CM | POA: Diagnosis not present

## 2019-10-07 DIAGNOSIS — I5023 Acute on chronic systolic (congestive) heart failure: Secondary | ICD-10-CM | POA: Diagnosis not present

## 2019-10-07 DIAGNOSIS — I5022 Chronic systolic (congestive) heart failure: Secondary | ICD-10-CM | POA: Diagnosis not present

## 2019-10-07 DIAGNOSIS — D6959 Other secondary thrombocytopenia: Secondary | ICD-10-CM | POA: Diagnosis present

## 2019-10-07 DIAGNOSIS — M545 Low back pain, unspecified: Secondary | ICD-10-CM

## 2019-10-07 DIAGNOSIS — M5126 Other intervertebral disc displacement, lumbar region: Secondary | ICD-10-CM | POA: Diagnosis not present

## 2019-10-07 DIAGNOSIS — K5900 Constipation, unspecified: Secondary | ICD-10-CM | POA: Diagnosis not present

## 2019-10-07 DIAGNOSIS — N1831 Chronic kidney disease, stage 3a: Secondary | ICD-10-CM | POA: Diagnosis present

## 2019-10-07 DIAGNOSIS — N182 Chronic kidney disease, stage 2 (mild): Secondary | ICD-10-CM | POA: Diagnosis not present

## 2019-10-07 DIAGNOSIS — H409 Unspecified glaucoma: Secondary | ICD-10-CM | POA: Diagnosis not present

## 2019-10-07 DIAGNOSIS — M5030 Other cervical disc degeneration, unspecified cervical region: Secondary | ICD-10-CM | POA: Diagnosis present

## 2019-10-07 DIAGNOSIS — M5136 Other intervertebral disc degeneration, lumbar region: Secondary | ICD-10-CM | POA: Diagnosis present

## 2019-10-07 DIAGNOSIS — W19XXXA Unspecified fall, initial encounter: Secondary | ICD-10-CM

## 2019-10-07 DIAGNOSIS — M1990 Unspecified osteoarthritis, unspecified site: Secondary | ICD-10-CM | POA: Diagnosis present

## 2019-10-07 DIAGNOSIS — R531 Weakness: Secondary | ICD-10-CM

## 2019-10-07 DIAGNOSIS — I5020 Unspecified systolic (congestive) heart failure: Secondary | ICD-10-CM

## 2019-10-07 DIAGNOSIS — Z751 Person awaiting admission to adequate facility elsewhere: Secondary | ICD-10-CM

## 2019-10-07 DIAGNOSIS — Z9181 History of falling: Secondary | ICD-10-CM

## 2019-10-07 DIAGNOSIS — Z20822 Contact with and (suspected) exposure to covid-19: Secondary | ICD-10-CM | POA: Diagnosis present

## 2019-10-07 DIAGNOSIS — E440 Moderate protein-calorie malnutrition: Secondary | ICD-10-CM | POA: Insufficient documentation

## 2019-10-07 DIAGNOSIS — I10 Essential (primary) hypertension: Secondary | ICD-10-CM | POA: Diagnosis not present

## 2019-10-07 DIAGNOSIS — M549 Dorsalgia, unspecified: Secondary | ICD-10-CM | POA: Diagnosis present

## 2019-10-07 DIAGNOSIS — H9190 Unspecified hearing loss, unspecified ear: Secondary | ICD-10-CM | POA: Diagnosis present

## 2019-10-07 DIAGNOSIS — Z808 Family history of malignant neoplasm of other organs or systems: Secondary | ICD-10-CM

## 2019-10-07 DIAGNOSIS — I130 Hypertensive heart and chronic kidney disease with heart failure and stage 1 through stage 4 chronic kidney disease, or unspecified chronic kidney disease: Secondary | ICD-10-CM | POA: Diagnosis present

## 2019-10-07 DIAGNOSIS — J3801 Paralysis of vocal cords and larynx, unilateral: Secondary | ICD-10-CM | POA: Diagnosis not present

## 2019-10-07 DIAGNOSIS — Z811 Family history of alcohol abuse and dependence: Secondary | ICD-10-CM

## 2019-10-07 DIAGNOSIS — M7120 Synovial cyst of popliteal space [Baker], unspecified knee: Secondary | ICD-10-CM | POA: Diagnosis present

## 2019-10-07 DIAGNOSIS — R1312 Dysphagia, oropharyngeal phase: Secondary | ICD-10-CM | POA: Diagnosis not present

## 2019-10-07 DIAGNOSIS — I152 Hypertension secondary to endocrine disorders: Secondary | ICD-10-CM | POA: Diagnosis present

## 2019-10-07 DIAGNOSIS — R490 Dysphonia: Secondary | ICD-10-CM | POA: Diagnosis present

## 2019-10-07 DIAGNOSIS — G8929 Other chronic pain: Secondary | ICD-10-CM | POA: Diagnosis not present

## 2019-10-07 DIAGNOSIS — I509 Heart failure, unspecified: Secondary | ICD-10-CM | POA: Diagnosis not present

## 2019-10-07 DIAGNOSIS — I361 Nonrheumatic tricuspid (valve) insufficiency: Secondary | ICD-10-CM | POA: Diagnosis not present

## 2019-10-07 DIAGNOSIS — Y92009 Unspecified place in unspecified non-institutional (private) residence as the place of occurrence of the external cause: Secondary | ICD-10-CM | POA: Diagnosis not present

## 2019-10-07 DIAGNOSIS — B951 Streptococcus, group B, as the cause of diseases classified elsewhere: Secondary | ICD-10-CM

## 2019-10-07 DIAGNOSIS — R1310 Dysphagia, unspecified: Secondary | ICD-10-CM | POA: Diagnosis not present

## 2019-10-07 DIAGNOSIS — R7881 Bacteremia: Secondary | ICD-10-CM

## 2019-10-07 DIAGNOSIS — K7689 Other specified diseases of liver: Secondary | ICD-10-CM | POA: Diagnosis not present

## 2019-10-07 DIAGNOSIS — K439 Ventral hernia without obstruction or gangrene: Secondary | ICD-10-CM | POA: Diagnosis present

## 2019-10-07 DIAGNOSIS — M542 Cervicalgia: Secondary | ICD-10-CM | POA: Diagnosis not present

## 2019-10-07 DIAGNOSIS — R296 Repeated falls: Secondary | ICD-10-CM | POA: Diagnosis not present

## 2019-10-07 DIAGNOSIS — R1314 Dysphagia, pharyngoesophageal phase: Secondary | ICD-10-CM | POA: Diagnosis not present

## 2019-10-07 DIAGNOSIS — E119 Type 2 diabetes mellitus without complications: Secondary | ICD-10-CM

## 2019-10-07 DIAGNOSIS — M5124 Other intervertebral disc displacement, thoracic region: Secondary | ICD-10-CM | POA: Diagnosis not present

## 2019-10-07 DIAGNOSIS — M47816 Spondylosis without myelopathy or radiculopathy, lumbar region: Secondary | ICD-10-CM | POA: Diagnosis present

## 2019-10-07 DIAGNOSIS — D72829 Elevated white blood cell count, unspecified: Secondary | ICD-10-CM | POA: Diagnosis not present

## 2019-10-07 DIAGNOSIS — M5137 Other intervertebral disc degeneration, lumbosacral region: Secondary | ICD-10-CM | POA: Diagnosis present

## 2019-10-07 DIAGNOSIS — N179 Acute kidney failure, unspecified: Secondary | ICD-10-CM | POA: Diagnosis present

## 2019-10-07 DIAGNOSIS — K660 Peritoneal adhesions (postprocedural) (postinfection): Secondary | ICD-10-CM | POA: Diagnosis present

## 2019-10-07 DIAGNOSIS — Z79899 Other long term (current) drug therapy: Secondary | ICD-10-CM

## 2019-10-07 DIAGNOSIS — Z7289 Other problems related to lifestyle: Secondary | ICD-10-CM

## 2019-10-07 DIAGNOSIS — N1832 Chronic kidney disease, stage 3b: Secondary | ICD-10-CM | POA: Diagnosis present

## 2019-10-07 DIAGNOSIS — E896 Postprocedural adrenocortical (-medullary) hypofunction: Secondary | ICD-10-CM | POA: Diagnosis present

## 2019-10-07 DIAGNOSIS — N400 Enlarged prostate without lower urinary tract symptoms: Secondary | ICD-10-CM | POA: Diagnosis present

## 2019-10-07 DIAGNOSIS — I429 Cardiomyopathy, unspecified: Secondary | ICD-10-CM | POA: Diagnosis not present

## 2019-10-07 DIAGNOSIS — R1313 Dysphagia, pharyngeal phase: Secondary | ICD-10-CM | POA: Diagnosis present

## 2019-10-07 DIAGNOSIS — M50221 Other cervical disc displacement at C4-C5 level: Secondary | ICD-10-CM | POA: Diagnosis not present

## 2019-10-07 DIAGNOSIS — Z833 Family history of diabetes mellitus: Secondary | ICD-10-CM

## 2019-10-07 DIAGNOSIS — M5489 Other dorsalgia: Secondary | ICD-10-CM | POA: Diagnosis not present

## 2019-10-07 DIAGNOSIS — S299XXA Unspecified injury of thorax, initial encounter: Secondary | ICD-10-CM | POA: Diagnosis not present

## 2019-10-07 DIAGNOSIS — I420 Dilated cardiomyopathy: Secondary | ICD-10-CM | POA: Diagnosis not present

## 2019-10-07 DIAGNOSIS — M79641 Pain in right hand: Secondary | ICD-10-CM | POA: Diagnosis not present

## 2019-10-07 DIAGNOSIS — E1159 Type 2 diabetes mellitus with other circulatory complications: Secondary | ICD-10-CM

## 2019-10-07 DIAGNOSIS — E1122 Type 2 diabetes mellitus with diabetic chronic kidney disease: Secondary | ICD-10-CM | POA: Diagnosis not present

## 2019-10-07 DIAGNOSIS — Z974 Presence of external hearing-aid: Secondary | ICD-10-CM

## 2019-10-07 DIAGNOSIS — I350 Nonrheumatic aortic (valve) stenosis: Secondary | ICD-10-CM | POA: Diagnosis not present

## 2019-10-07 DIAGNOSIS — N183 Chronic kidney disease, stage 3 unspecified: Secondary | ICD-10-CM | POA: Diagnosis present

## 2019-10-07 DIAGNOSIS — D696 Thrombocytopenia, unspecified: Secondary | ICD-10-CM | POA: Diagnosis not present

## 2019-10-07 DIAGNOSIS — D3501 Benign neoplasm of right adrenal gland: Secondary | ICD-10-CM | POA: Diagnosis present

## 2019-10-07 DIAGNOSIS — R682 Dry mouth, unspecified: Secondary | ICD-10-CM | POA: Diagnosis not present

## 2019-10-07 DIAGNOSIS — I5021 Acute systolic (congestive) heart failure: Secondary | ICD-10-CM | POA: Diagnosis not present

## 2019-10-07 DIAGNOSIS — Z8249 Family history of ischemic heart disease and other diseases of the circulatory system: Secondary | ICD-10-CM

## 2019-10-07 DIAGNOSIS — M25531 Pain in right wrist: Secondary | ICD-10-CM | POA: Diagnosis not present

## 2019-10-07 DIAGNOSIS — Z6826 Body mass index (BMI) 26.0-26.9, adult: Secondary | ICD-10-CM

## 2019-10-07 DIAGNOSIS — J3800 Paralysis of vocal cords and larynx, unspecified: Secondary | ICD-10-CM | POA: Diagnosis not present

## 2019-10-07 DIAGNOSIS — M2550 Pain in unspecified joint: Secondary | ICD-10-CM | POA: Diagnosis not present

## 2019-10-07 DIAGNOSIS — Z743 Need for continuous supervision: Secondary | ICD-10-CM | POA: Diagnosis not present

## 2019-10-07 HISTORY — DX: Bacteremia: R78.81

## 2019-10-07 HISTORY — DX: Streptococcus, group b, as the cause of diseases classified elsewhere: B95.1

## 2019-10-07 LAB — CBC WITH DIFFERENTIAL/PLATELET
Abs Immature Granulocytes: 2.9 10*3/uL — ABNORMAL HIGH (ref 0.00–0.07)
Basophils Absolute: 0 10*3/uL (ref 0.0–0.1)
Basophils Relative: 0 %
Eosinophils Absolute: 0 10*3/uL (ref 0.0–0.5)
Eosinophils Relative: 0 %
HCT: 43.4 % (ref 39.0–52.0)
Hemoglobin: 14.4 g/dL (ref 13.0–17.0)
Immature Granulocytes: 10 %
Lymphocytes Relative: 1 %
Lymphs Abs: 0.3 10*3/uL — ABNORMAL LOW (ref 0.7–4.0)
MCH: 31 pg (ref 26.0–34.0)
MCHC: 33.2 g/dL (ref 30.0–36.0)
MCV: 93.3 fL (ref 80.0–100.0)
Monocytes Absolute: 0.6 10*3/uL (ref 0.1–1.0)
Monocytes Relative: 2 %
Neutro Abs: 24.3 10*3/uL — ABNORMAL HIGH (ref 1.7–7.7)
Neutrophils Relative %: 87 %
Platelets: 121 10*3/uL — ABNORMAL LOW (ref 150–400)
RBC: 4.65 MIL/uL (ref 4.22–5.81)
RDW: 13.2 % (ref 11.5–15.5)
WBC: 28.1 10*3/uL — ABNORMAL HIGH (ref 4.0–10.5)
nRBC: 0 % (ref 0.0–0.2)

## 2019-10-07 LAB — BLOOD CULTURE ID PANEL (REFLEXED)

## 2019-10-07 LAB — BASIC METABOLIC PANEL
Anion gap: 9 (ref 5–15)
BUN: 66 mg/dL — ABNORMAL HIGH (ref 8–23)
CO2: 23 mmol/L (ref 22–32)
Calcium: 8.6 mg/dL — ABNORMAL LOW (ref 8.9–10.3)
Chloride: 102 mmol/L (ref 98–111)
Creatinine, Ser: 2.44 mg/dL — ABNORMAL HIGH (ref 0.61–1.24)
GFR calc Af Amer: 27 mL/min — ABNORMAL LOW (ref >60)
GFR calc non Af Amer: 23 mL/min — ABNORMAL LOW (ref >60)
Glucose, Bld: 140 mg/dL — ABNORMAL HIGH (ref 70–99)
Potassium: 5 mmol/L (ref 3.5–5.1)
Sodium: 134 mmol/L — ABNORMAL LOW (ref 135–145)

## 2019-10-07 LAB — GLUCOSE, CAPILLARY
Glucose-Capillary: 111 mg/dL — ABNORMAL HIGH (ref 70–99)
Glucose-Capillary: 231 mg/dL — ABNORMAL HIGH (ref 70–99)
Glucose-Capillary: 146 mg/dL — ABNORMAL HIGH (ref 70–99)
Glucose-Capillary: 167 mg/dL — ABNORMAL HIGH (ref 70–99)

## 2019-10-07 LAB — URINALYSIS, COMPLETE (UACMP) WITH MICROSCOPIC
Bacteria, UA: NONE SEEN
Bilirubin Urine: NEGATIVE
Glucose, UA: NEGATIVE mg/dL
Ketones, ur: NEGATIVE mg/dL
Leukocytes,Ua: NEGATIVE
Nitrite: NEGATIVE
Protein, ur: 100 mg/dL — AB
Specific Gravity, Urine: 1.02 (ref 1.005–1.030)
Squamous Epithelial / HPF: NONE SEEN (ref 0–5)
pH: 5 (ref 5.0–8.0)

## 2019-10-07 LAB — RESPIRATORY PANEL BY RT PCR (FLU A&B, COVID)
Influenza A by PCR: NEGATIVE
Influenza B by PCR: NEGATIVE
SARS Coronavirus 2 by RT PCR: NEGATIVE

## 2019-10-07 LAB — HEMOGLOBIN A1C
Hgb A1c MFr Bld: 6.8 % — ABNORMAL HIGH (ref 4.8–5.6)
Mean Plasma Glucose: 148.46 mg/dL

## 2019-10-07 LAB — CK: Total CK: 305 U/L (ref 49–397)

## 2019-10-07 LAB — LACTIC ACID, PLASMA: Lactic Acid, Venous: 1.7 mmol/L (ref 0.5–1.9)

## 2019-10-07 IMAGING — CR DG CHEST 2V
2 series · 2 of 2 positions shown · non-contrast
Comparison: None.

CLINICAL DATA: Weakness over the past couple days with chronic low
back pain. 2 falls over the past week.

EXAM:
CHEST - 2 VIEW

[chest lat]
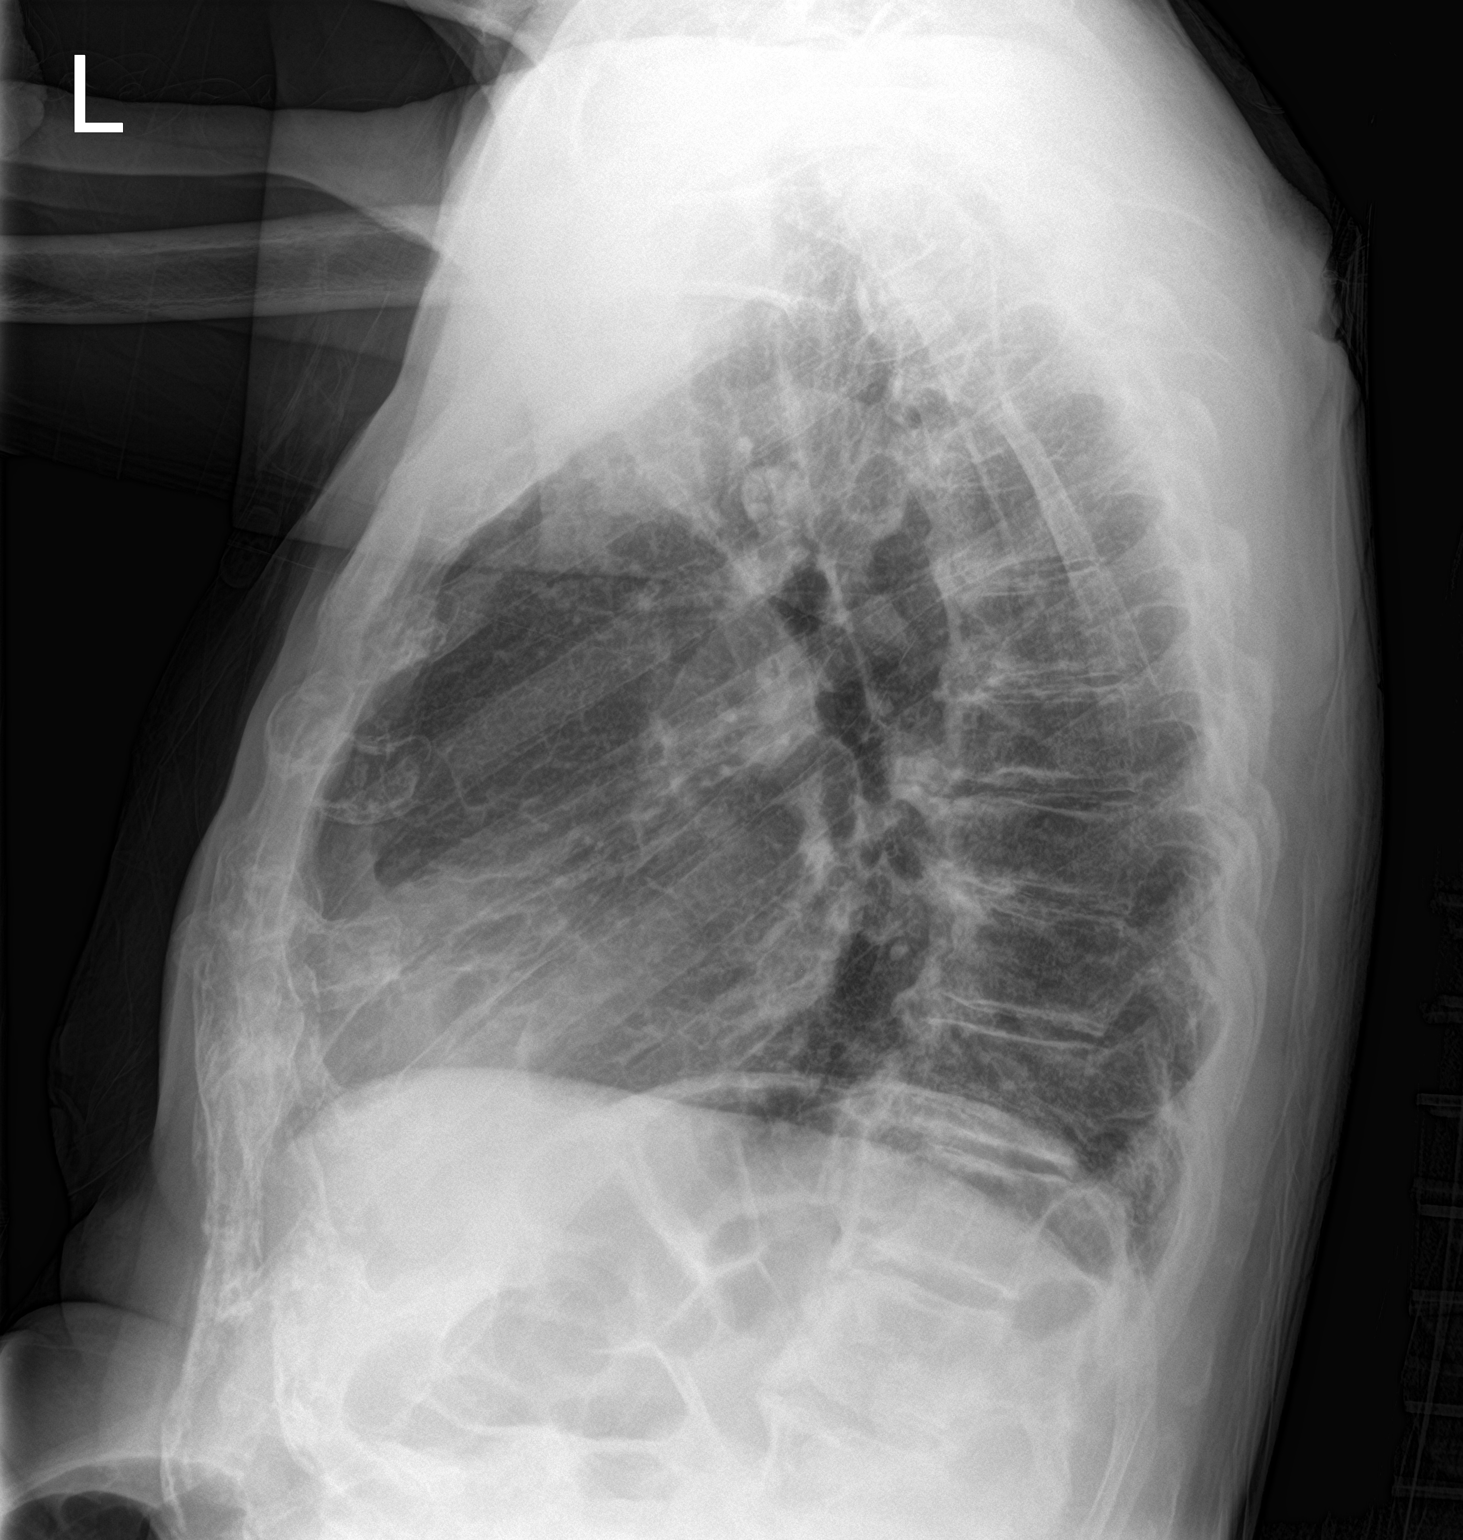

[chest ap]
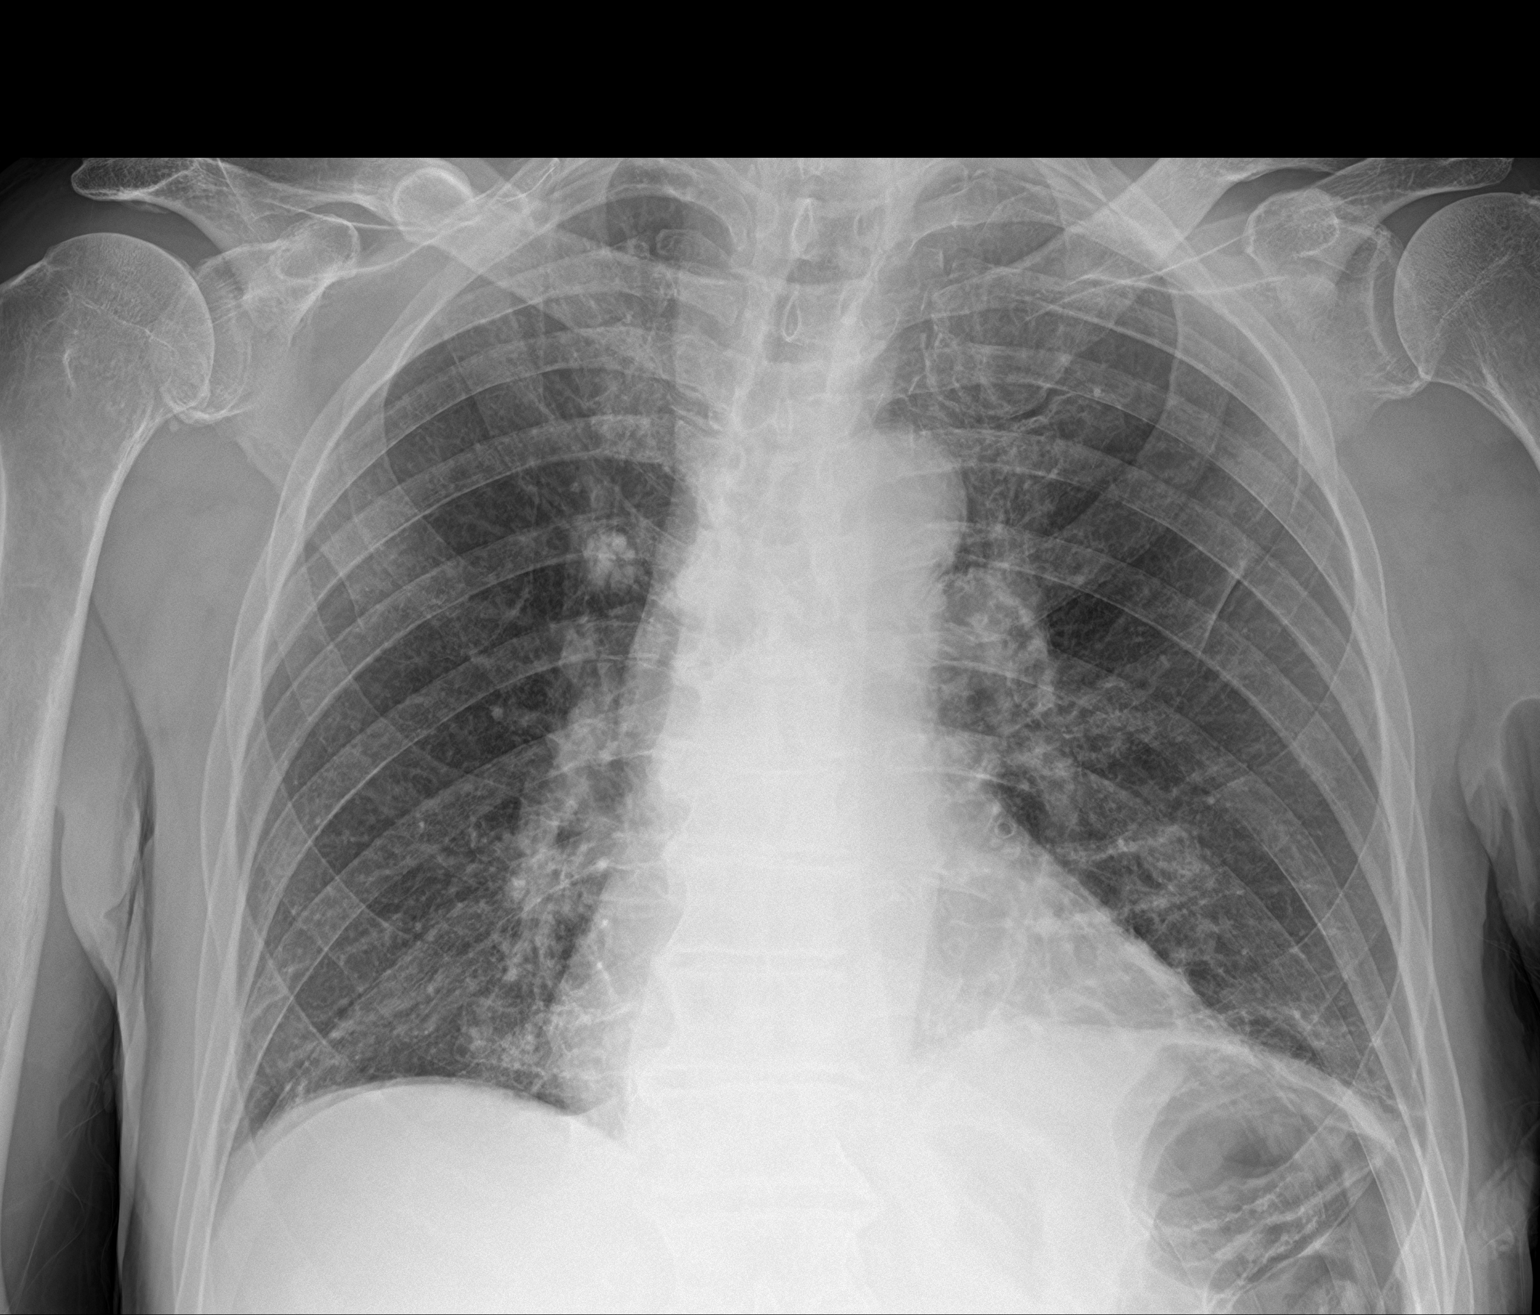

[2 of 2 positions shown; findings below may reference images not displayed]

FINDINGS: Lungs are adequately inflated without focal airspace consolidation
or effusion. Calcified right suprahilar lymph nodes.
Cardiomediastinal silhouette is unremarkable. Suggestion of an old
right clavicle fracture. No acute fracture. Degenerative change of
the spine.
IMPRESSION: No acute findings.

## 2019-10-07 IMAGING — MR MR THORACIC SPINE W/O CM
6 series · 34 of 48 positions shown · non-contrast
Comparison: Chest radiograph [DATE]

CLINICAL DATA: Back pain in weakness.

EXAM:
MRI THORACIC SPINE WITHOUT CONTRAST
TECHNIQUE: Multiplanar, multisequence MR imaging of the thoracic spine was
performed. No intravenous contrast was administered.

[Series 14: T1 · sagittal · 6.0mm · 1.41mm/px · 4 of 9 slices shown (1 of 2)]
[im 1/9]
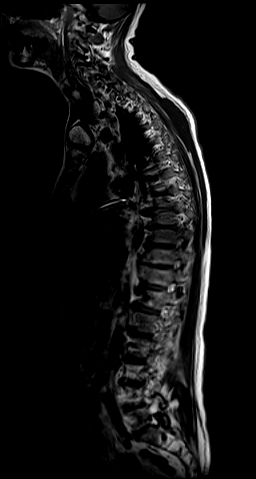
[im 3/9]
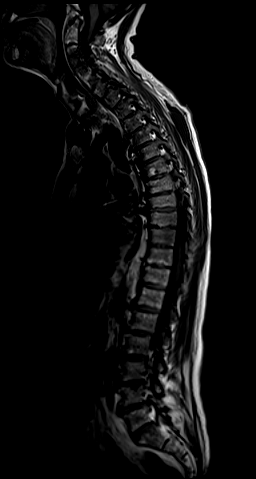
[im 6/9]
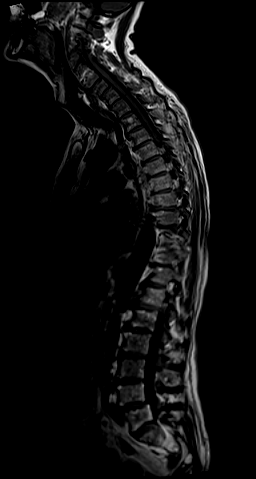
[im 9/9]
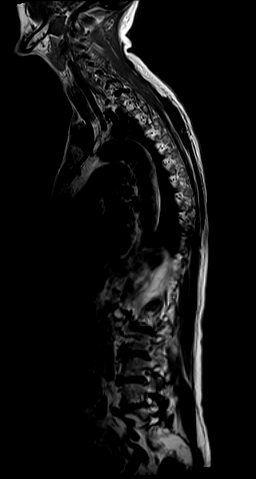

[Series 15: T2 · sagittal · 3.0mm · 1.33mm/px · 6 of 19 slices shown (1 of 2)]
[im 1/19]
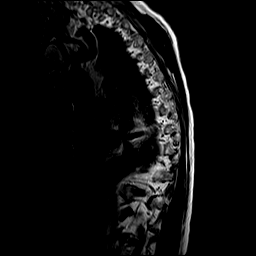
[im 4/19]
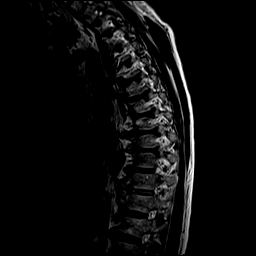
[im 8/19]
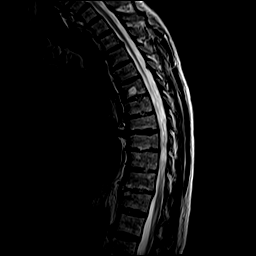
[im 11/19]
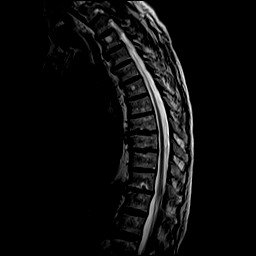
[im 15/19]
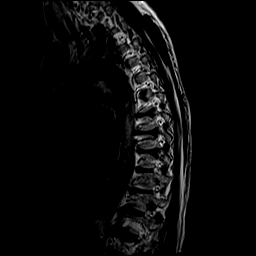
[im 19/19]
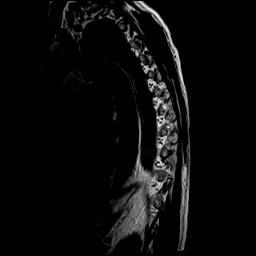

[Series 16: T1 · sagittal · 3.0mm · 1.33mm/px · 6 of 19 slices shown (2 of 2)]
[im 1/19]
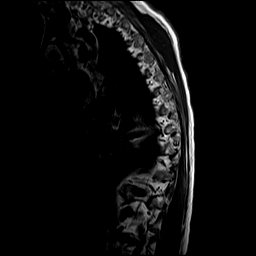
[im 4/19]
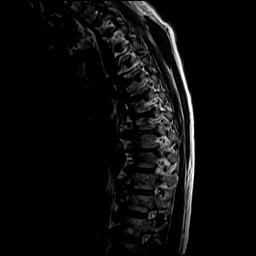
[im 8/19]
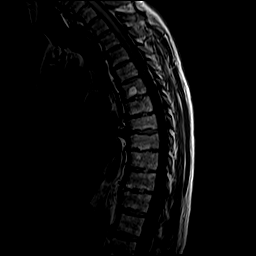
[im 11/19]
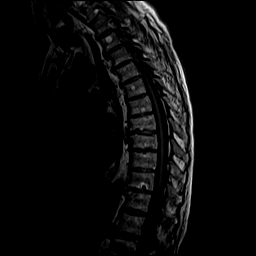
[im 15/19]
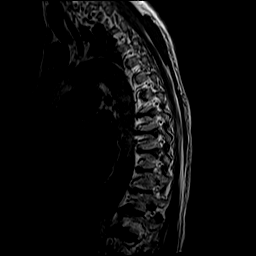
[im 19/19]
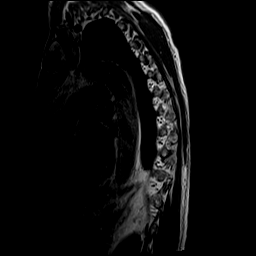

[Series 17: STIR · sagittal · 3.0mm · 0.66mm/px · 6 of 19 slices shown]
[im 1/19]
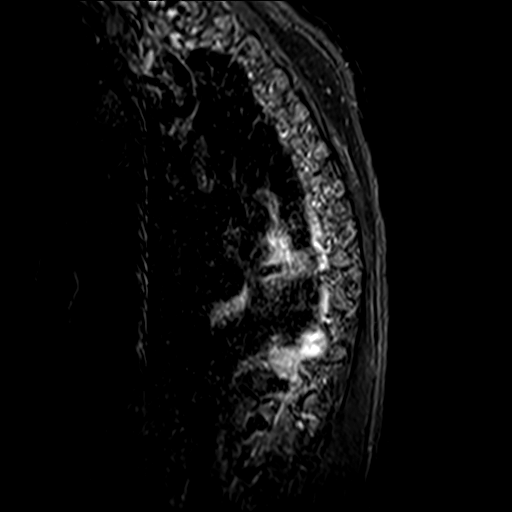
[im 4/19]
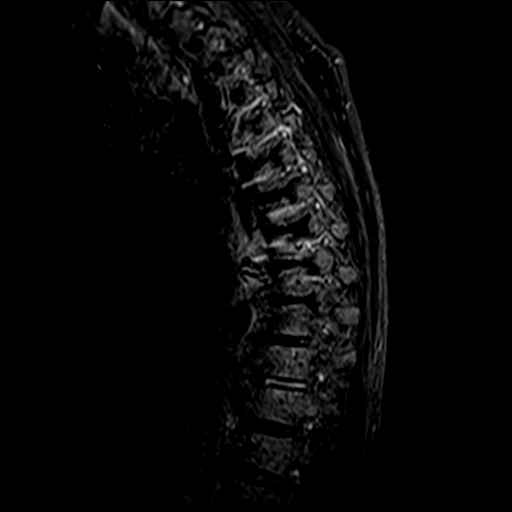
[im 8/19]
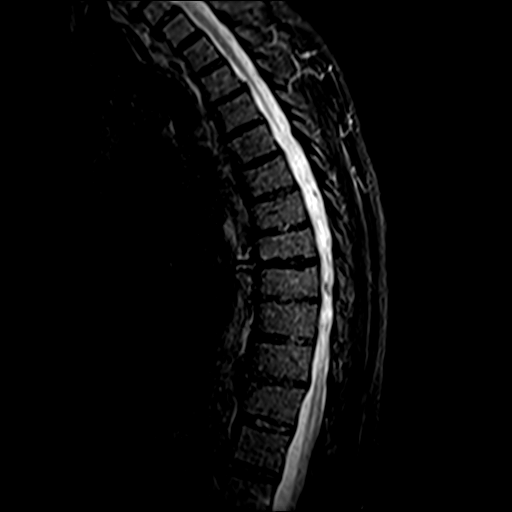
[im 11/19]
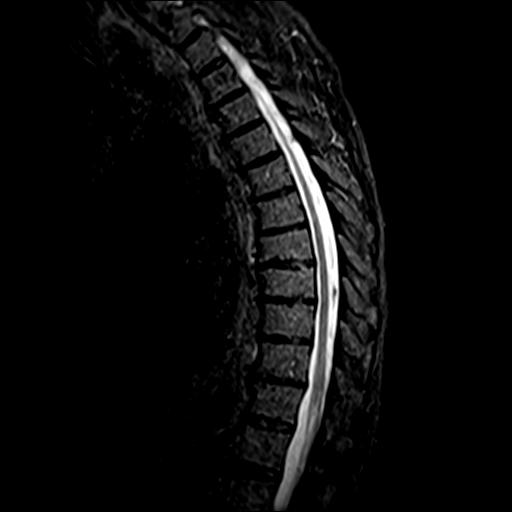
[im 15/19]
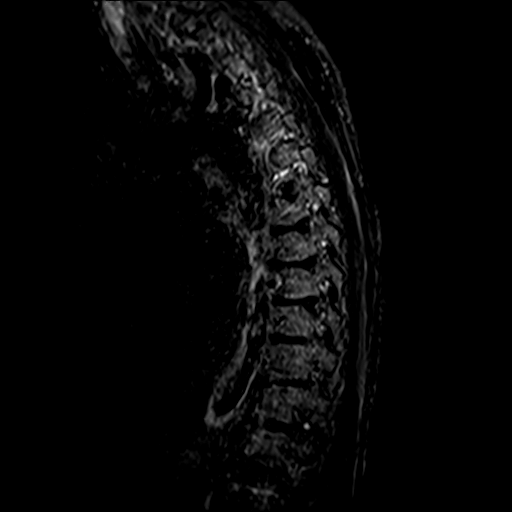
[im 19/19]
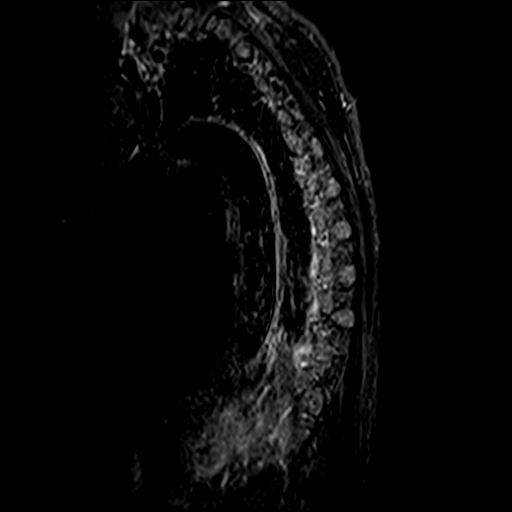

[Series 18: T2 · axial · 4.0mm · 0.59mm/px · z∈[-322,-111]mm · 9 of 39 slices shown (2 of 2)]
[im 1/39]
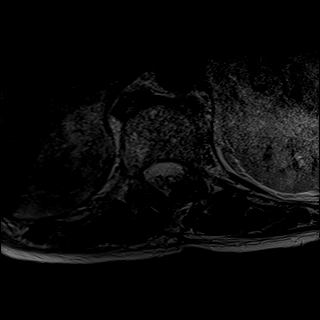
[im 7/39]
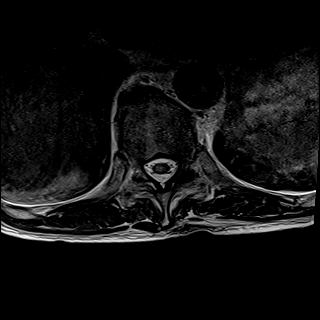
[im 13/39]
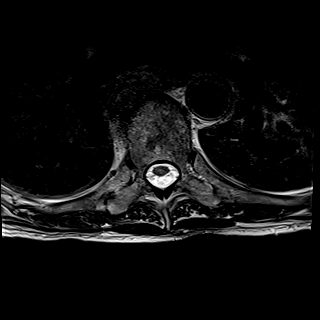
[im 16/39]
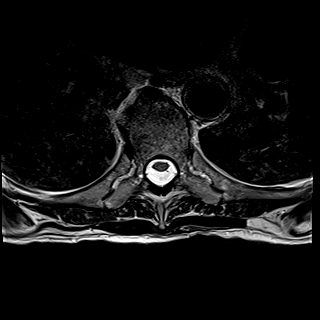
[im 20/39]
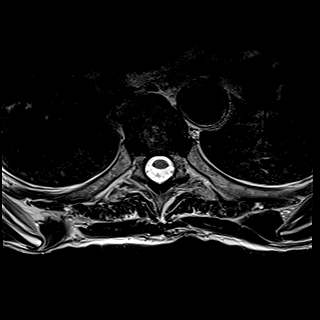
[im 23/39]
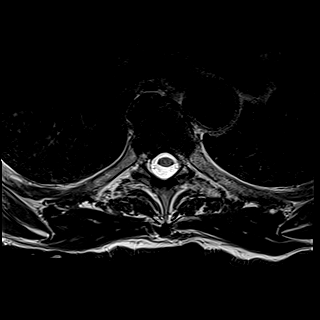
[im 26/39]
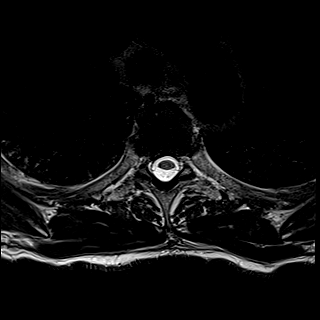
[im 32/39]
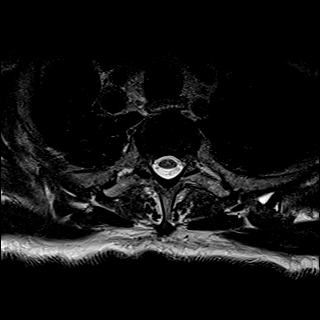
[im 39/39]
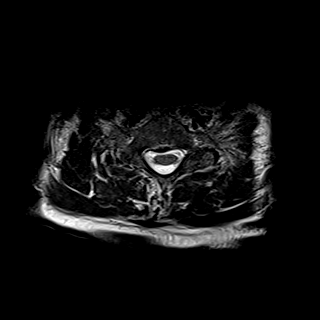

[Series 19: GRE · axial · 4.0mm · 0.37mm/px · z∈[-322,-234]mm · 3 of 39 slices shown]
[im 1/39]
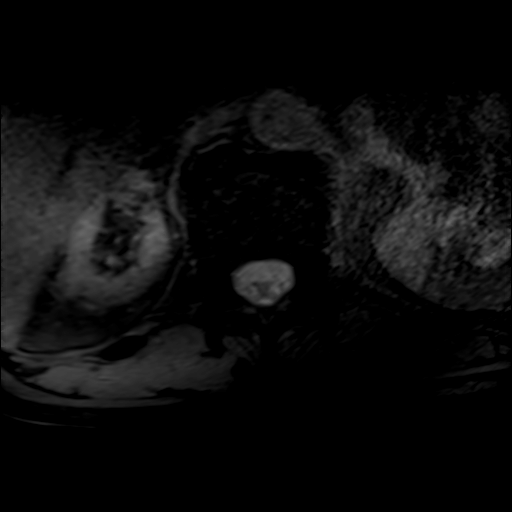
[im 7/39]
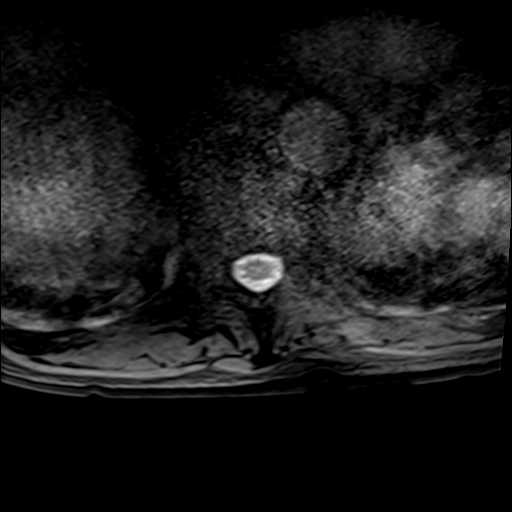
[im 13/39]
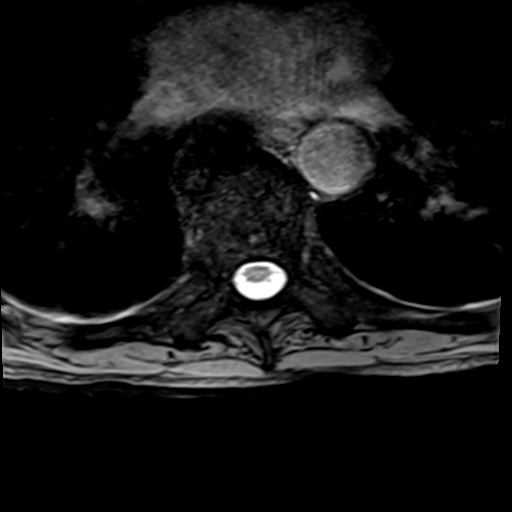

[34 of 48 positions shown; findings below may reference images not displayed]

FINDINGS: Alignment:  2 mm degenerative anterolisthesis at T1-2.

Vertebrae: Small hemangiomas in the T3 and T6 vertebra. Multilevel
Schmorl's nodes. Mild degenerative endplate findings at T9-10 and
T11-12. Disc desiccation throughout the thoracic spine with loss of
disc height most notable at T7-8, T9-10, T10-11, T11-12. No
significant vertebral marrow edema is identified.

Cord:  Unremarkable

Paraspinal and other soft tissues: Potential mild interstitial
accentuation in the lungs although not as readily apparent on the
dedicated chest radiograph.

Disc levels:

Mild disc bulges at T8-9, T9-10, T11-12 along with disc bulge and a
shallow right paracentral disc protrusion at T3-4, but no observed
impingement. Left foraminal perineural cyst at T1-2.
IMPRESSION: 1. Thoracic spondylosis and degenerative disc disease without
observed impingement.
2. Equivocal mild interstitial accentuation in the lungs although
not as readily apparent on the dedicated chest radio

## 2019-10-07 IMAGING — MR MR LUMBAR SPINE W/O CM
5 series · 33 of 48 positions shown · non-contrast
Comparison: Lumbar radiographs [DATE]

CLINICAL DATA: Weakness and back pain

EXAM:
MRI LUMBAR SPINE WITHOUT CONTRAST
TECHNIQUE: Multiplanar, multisequence MR imaging of the lumbar spine was
performed. No intravenous contrast was administered.

[Series 5: T2 · sagittal · 4.0mm · 1.02mm/px · 6 of 17 slices shown (1 of 2)]
[im 1/17]
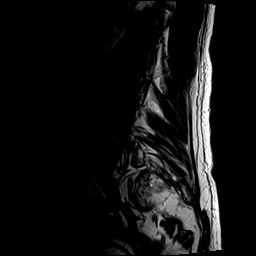
[im 4/17]
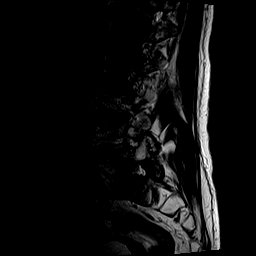
[im 7/17]
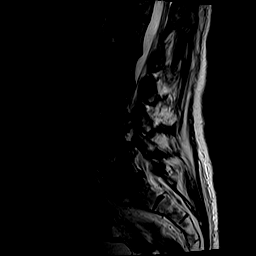
[im 10/17]
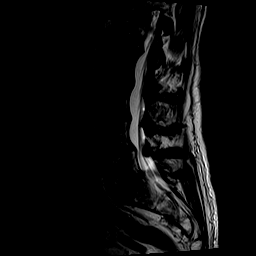
[im 13/17]
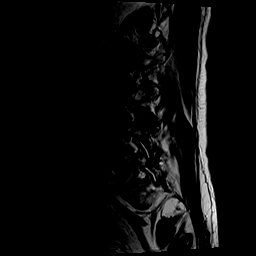
[im 17/17]
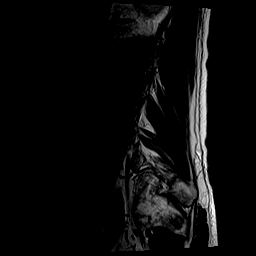

[Series 6: T1 · sagittal · 4.0mm · 1.02mm/px · 7 of 17 slices shown (1 of 2)]
[im 1/17]
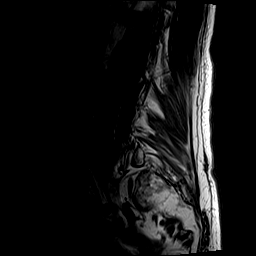
[im 3/17]
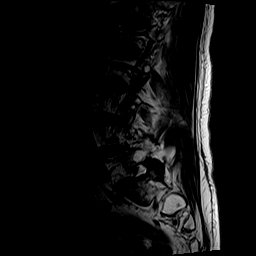
[im 6/17]
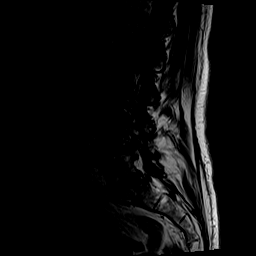
[im 9/17]
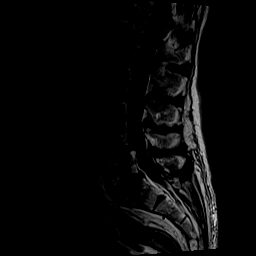
[im 11/17]
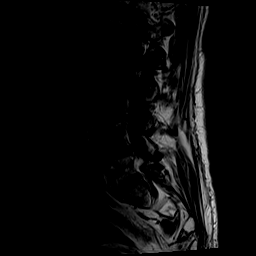
[im 14/17]
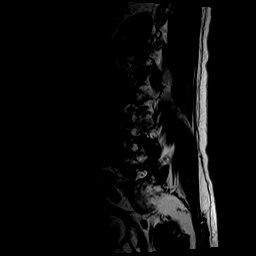
[im 17/17]
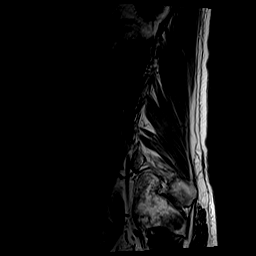

[Series 7: STIR · sagittal · 4.0mm · 0.51mm/px · 4 of 17 slices shown]
[im 1/17]
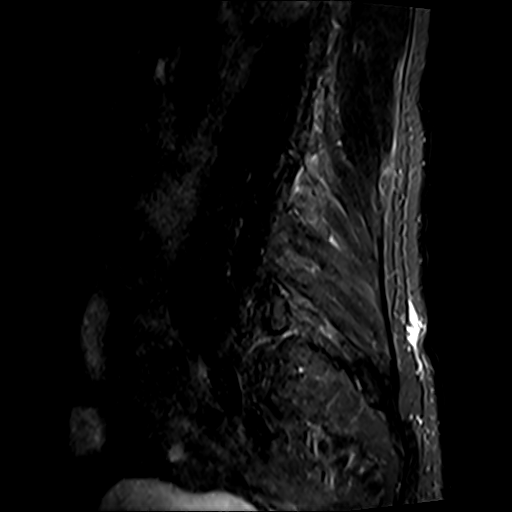
[im 3/17]
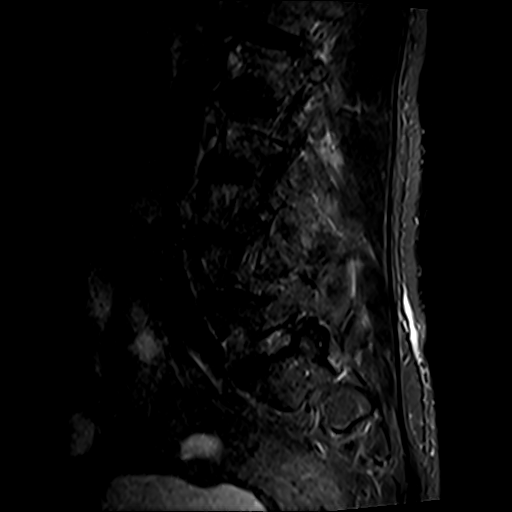
[im 6/17]
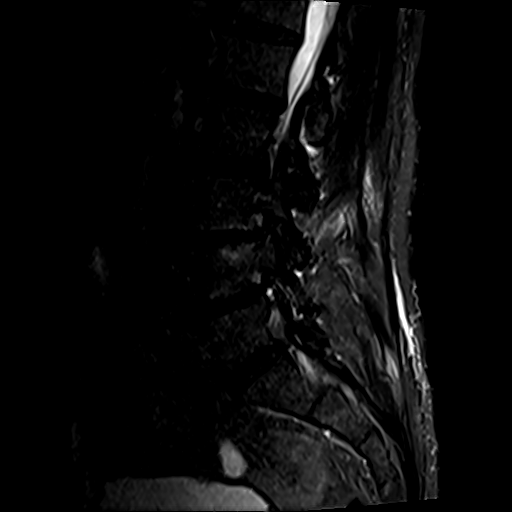
[im 9/17]
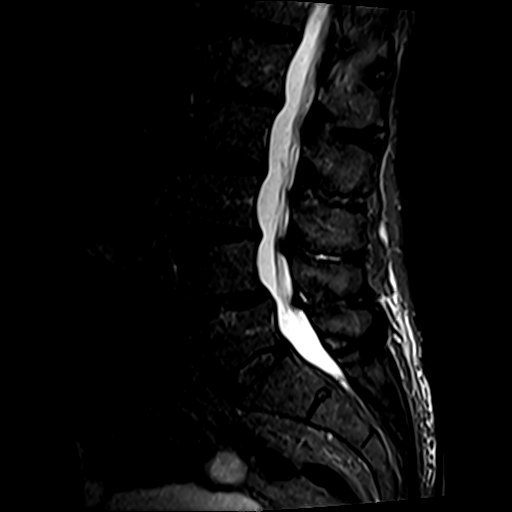

[Series 8: T2 · axial · 4.0mm · 0.78mm/px · z∈[-573,-352]mm · 8 of 36 slices shown (2 of 2)]
[im 1/36]
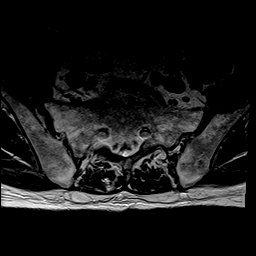
[im 6/36]
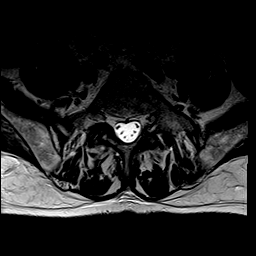
[im 11/36]
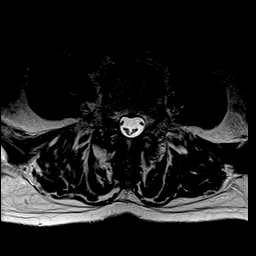
[im 17/36]
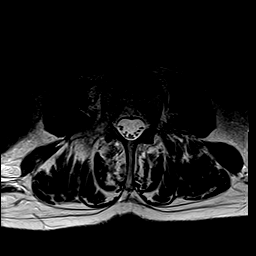
[im 19/36]
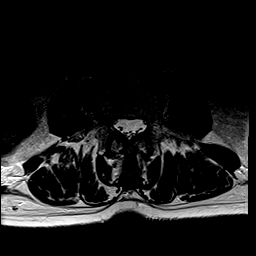
[im 25/36]
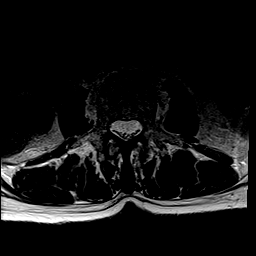
[im 30/36]
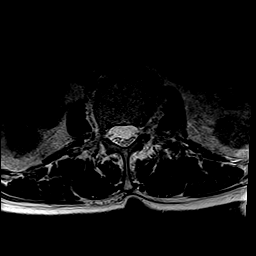
[im 36/36]
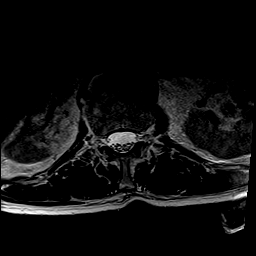

[Series 9: T1 · axial · 4.0mm · 0.39mm/px · z∈[-573,-352]mm · 8 of 36 slices shown (2 of 2)]
[im 1/36]
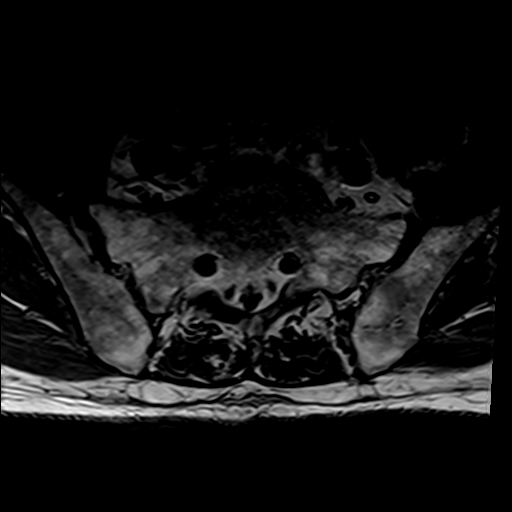
[im 6/36]
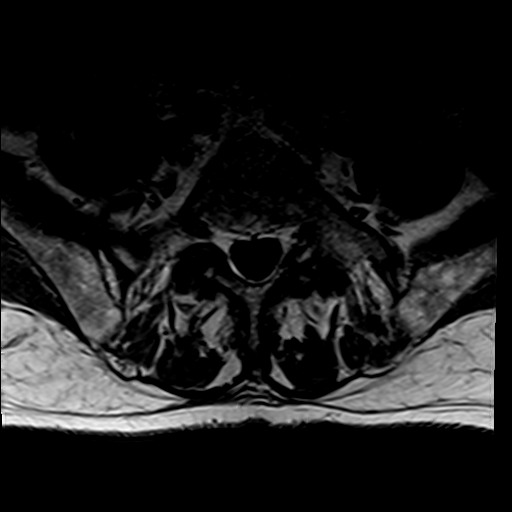
[im 11/36]
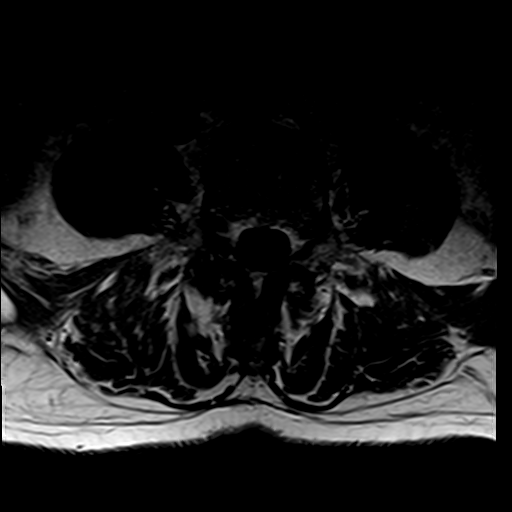
[im 17/36]
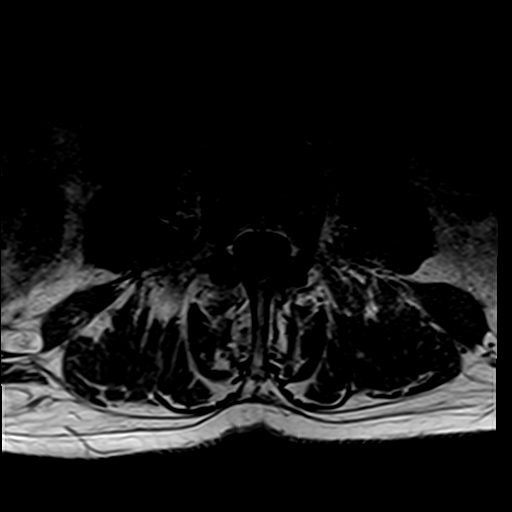
[im 19/36]
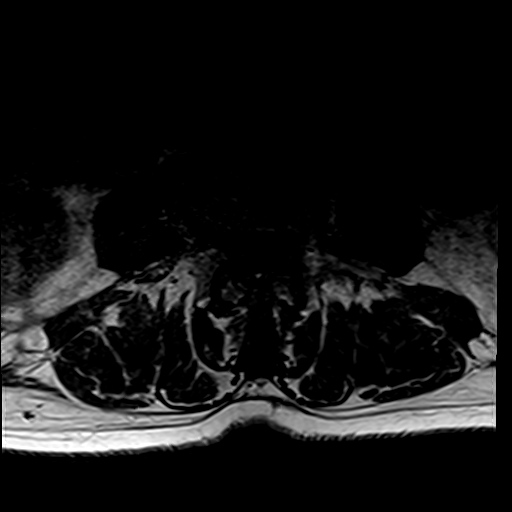
[im 25/36]
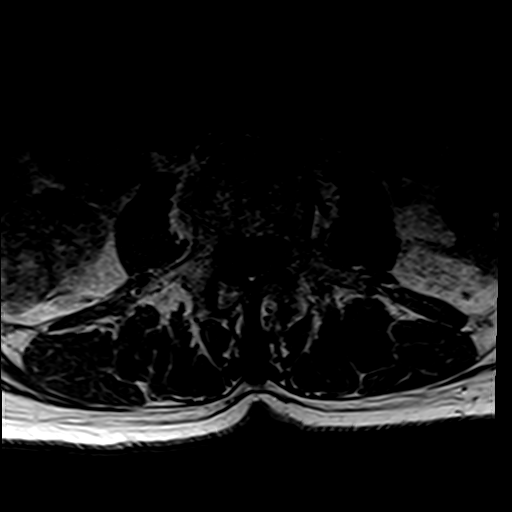
[im 30/36]
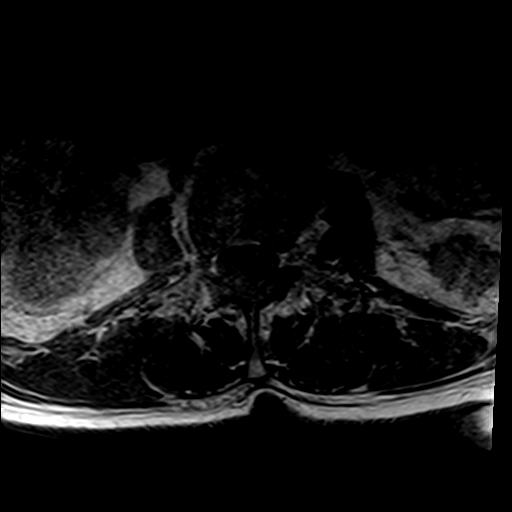
[im 36/36]
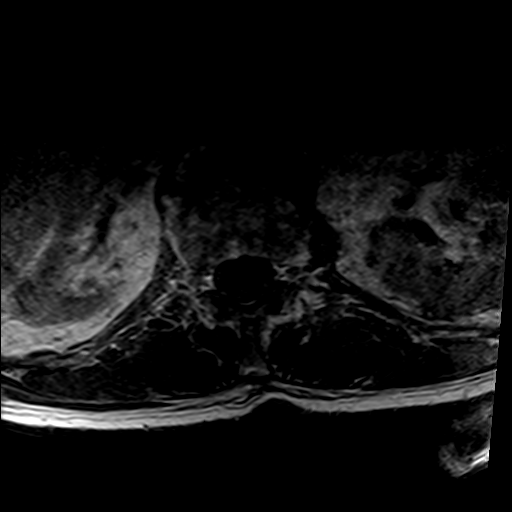

[33 of 48 positions shown; findings below may reference images not displayed]

FINDINGS: Despite efforts by the technologist and patient, motion artifact is
present on today's exam and could not be eliminated. This reduces
exam sensitivity and specificity.

Segmentation: The lowest lumbar type non-rib-bearing vertebra is
labeled as L5.

Alignment:  4 mm degenerative anterolisthesis at L4-5.

Vertebrae: Disc desiccation at all levels between L1 and L5 with
loss of disc height at L3-4 and L4-5. Type 1 degenerative endplate
findings at L3-4 with a Schmorl's node along the superior endplate
of L4. No lumbar spine fracture or acute bony findings.

Conus medullaris and cauda equina: Conus extends to the T12 level.
Conus and cauda equina appear normal.

Paraspinal and other soft tissues: Cortical thinning in the right
kidney lower pole favoring right renal atrophy although only a
portion of the right kidney is included.

Disc levels:

T12-L1: Unremarkable.

L1-2: No impingement. Diffuse disc bulge.

L2-3: No impingement. Diffuse disc bulge.

L3-4: Mild right foraminal stenosis with mild displacement of the
right L3 nerve the right lateral extraforaminal space borderline
right subarticular lateral recess stenosis as well as borderline
central narrowing of the thecal sac due to disc bulge,
intervertebral spurring, and facet arthropathy.

L4-5: Moderate central narrowing of the thecal sac with mild to
moderate right borderline left foraminal stenosis as well as mild
bilateral subarticular lateral recess stenosis due to disc
uncovering, disc bulge, intervertebral spurring, facet arthropathy.
Small left facet joint effusion.

L5-S1: Mild left foraminal stenosis with mild left and borderline
right subarticular lateral recess stenosis due to disc bulge facet
arthropathy. Small left facet joint effusion.
IMPRESSION: 1. Lumbar spondylosis and degenerative disc disease, causing
moderate impingement at L4-5 and mild impingement at L3-4 and L5-S1.
2. Cortical thinning of the right kidney lower pole suggesting right
renal atrophy.

## 2019-10-07 IMAGING — CT CT CERVICAL SPINE W/O CM
3 of 4 series · 11 of 33 positions shown, 13 images · non-contrast
Comparison: None.

CLINICAL DATA: Multiple falls

EXAM:
CT CERVICAL SPINE WITHOUT CONTRAST
TECHNIQUE: Multidetector CT imaging of the cervical spine was performed without
intravenous contrast. Multiplanar CT image reconstructions were also
generated.

[Series 4: sagittal bone · sagittal · 0.25mm/px · 5 of 74 slices shown, 6 images]
[im 25/74  bone]
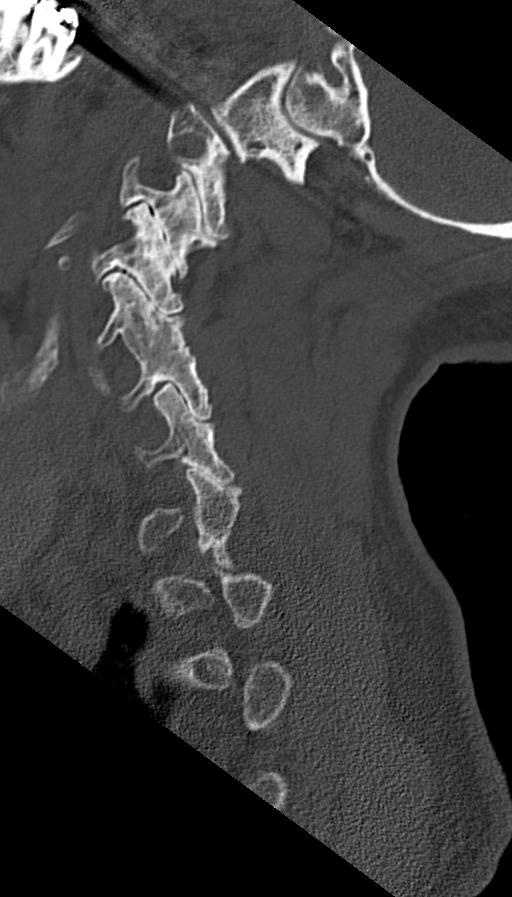
[im 31/74  bone]
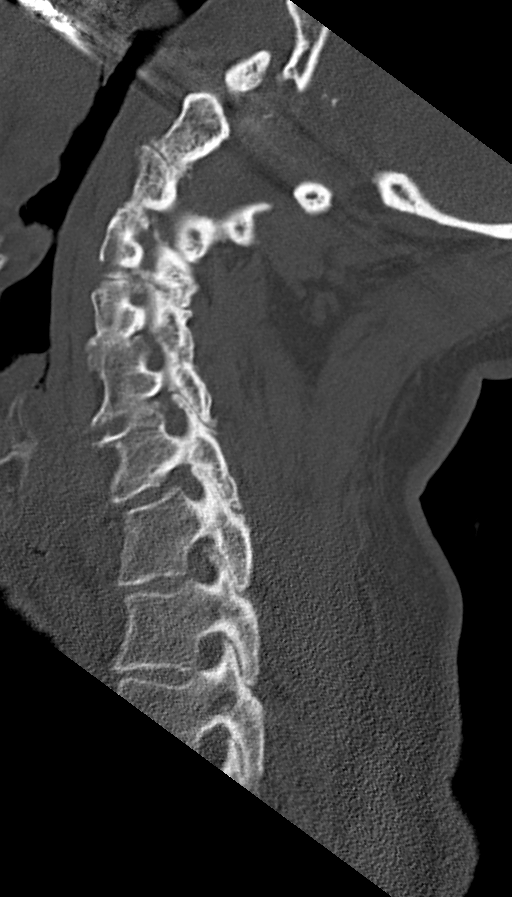
[im 37/74  soft-tissue]
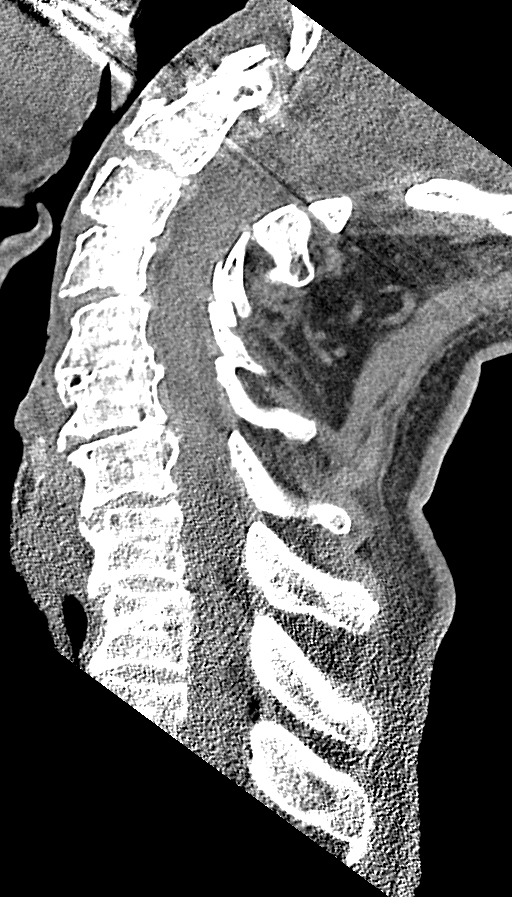
[im 37/74  bone]
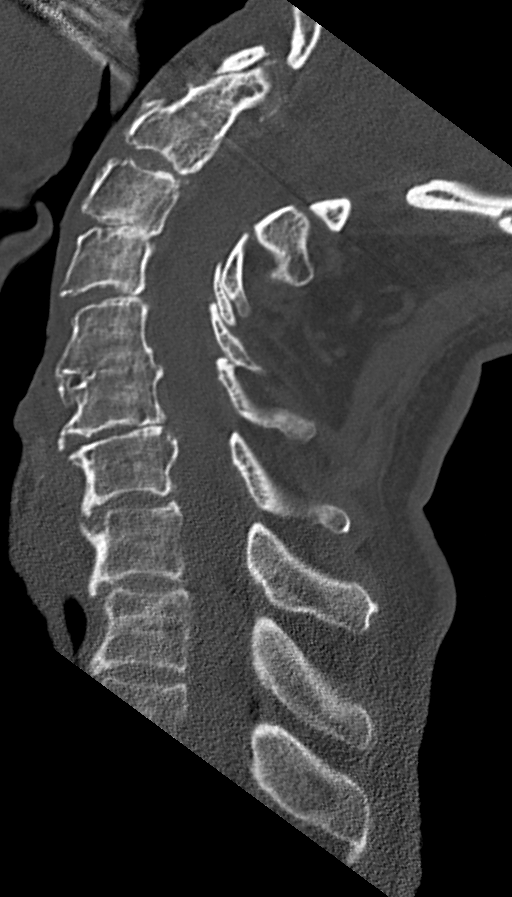
[im 43/74  bone]
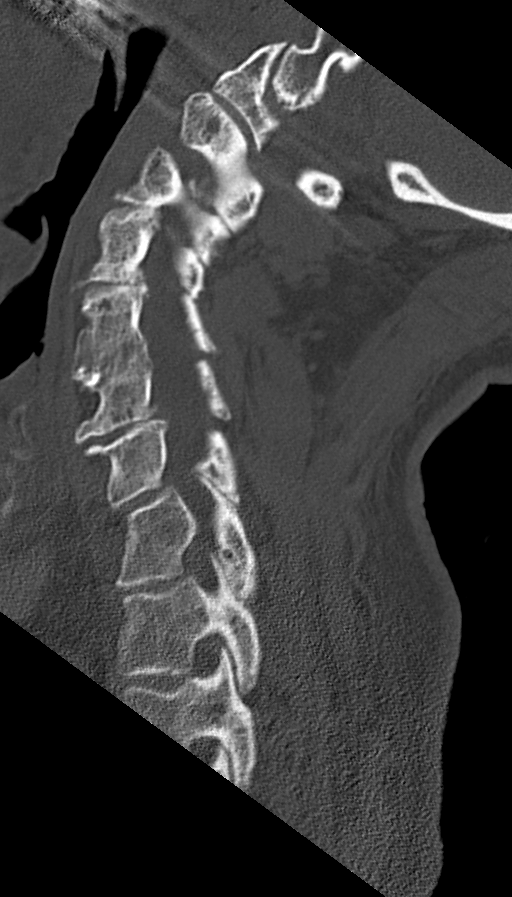
[im 49/74  bone]
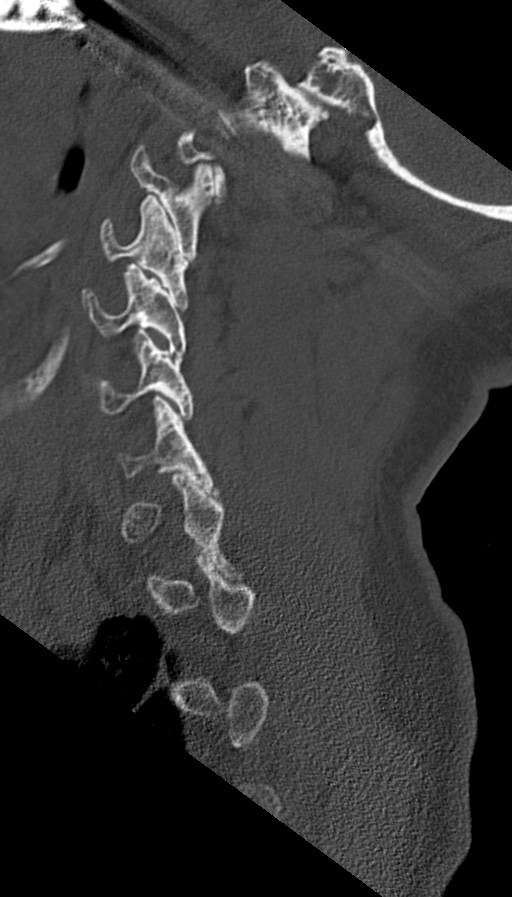

[Series 5: coronal bone · coronal · 0.29mm/px · 3 of 58 slices shown]
[im 17/58  bone]
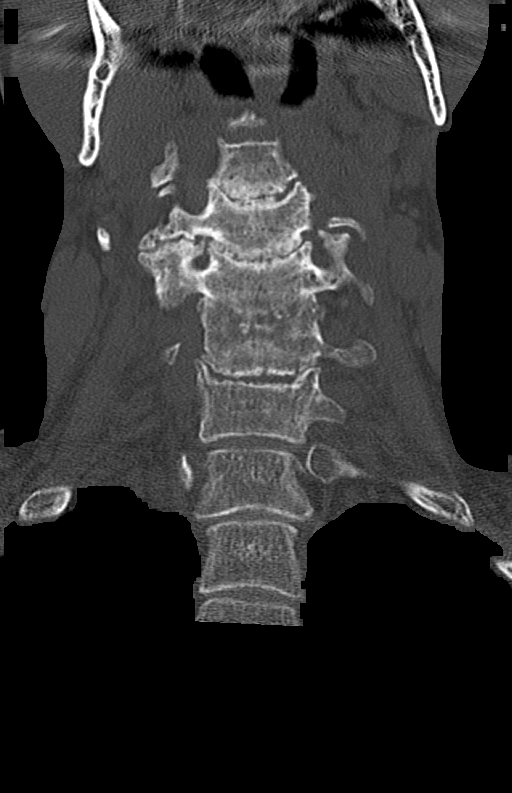
[im 25/58  bone]
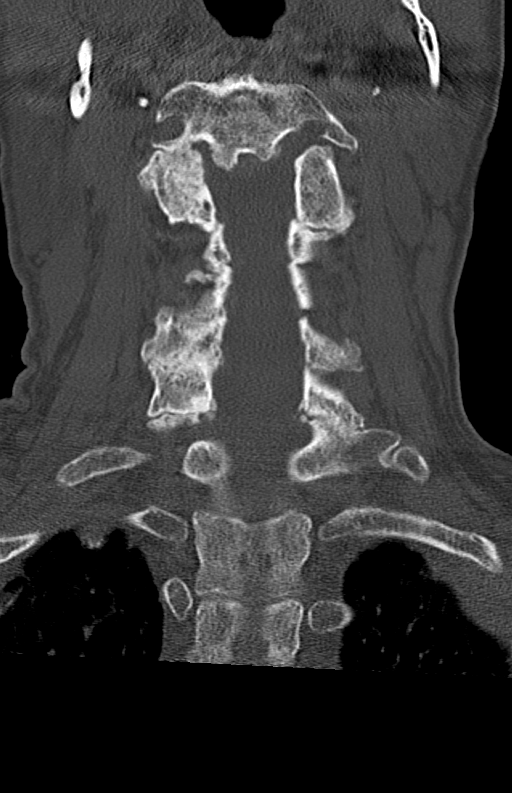
[im 33/58  bone]
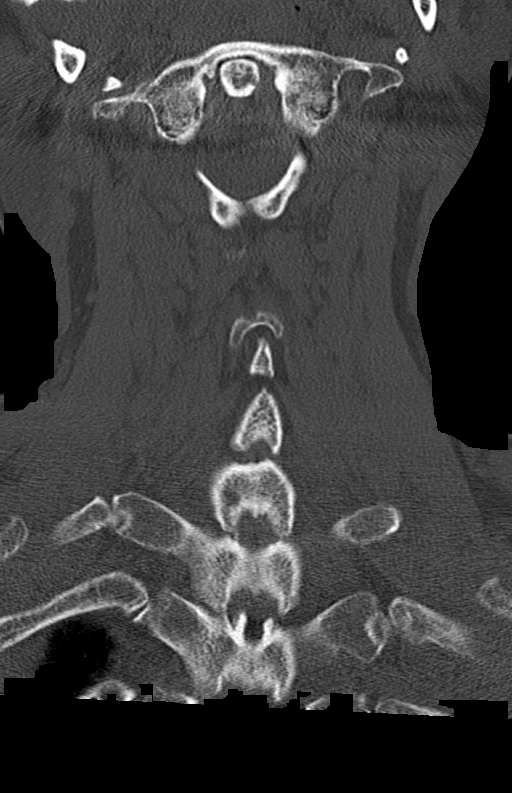

[Series 6: orthogonal bone · axial · 0.29mm/px · z∈[-340,-237]mm · 3 of 114 slices shown, 4 images]
[im 33/114  soft-tissue]
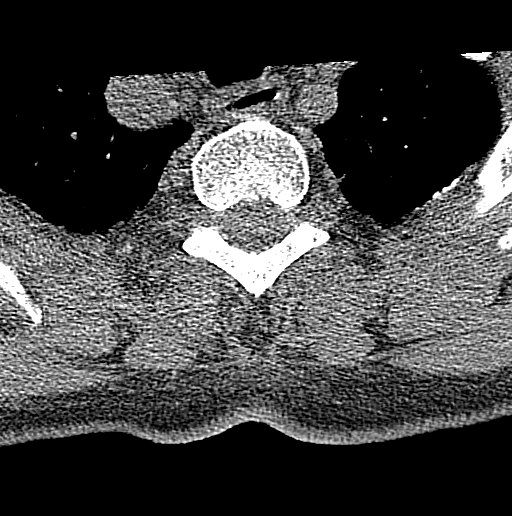
[im 33/114  bone]
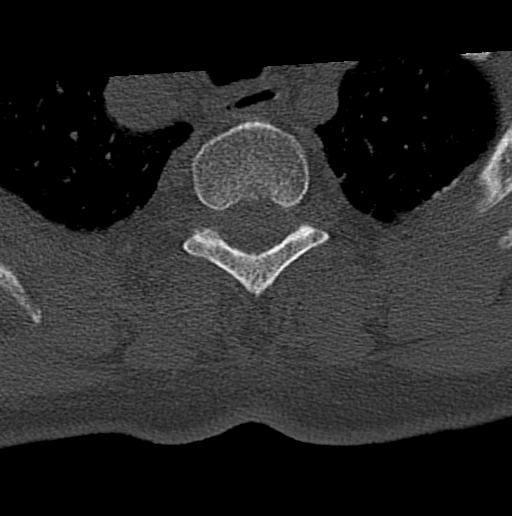
[im 65/114  bone]
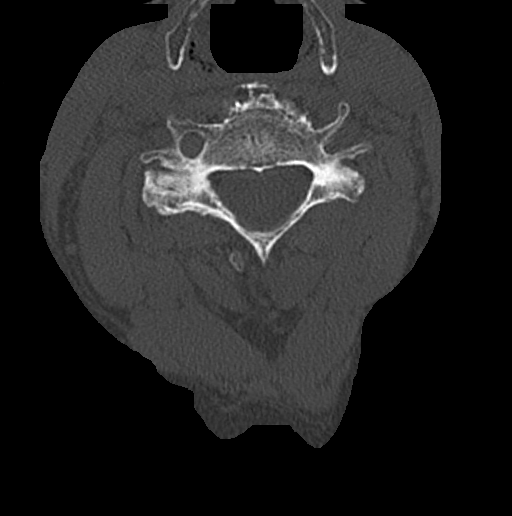
[im 97/114  bone]
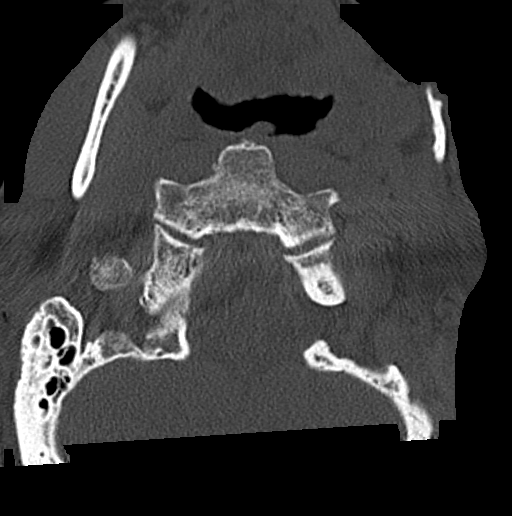

[11 of 33 positions shown; findings below may reference images not displayed]

FINDINGS: Alignment: Accentuation of cervical lordosis is likely related to
positioning. Mild anterolisthesis at C5-C6.

Skull base and vertebrae: There is no acute cervical spine fracture.
No destructive osseous lesion.

Soft tissues and spinal canal: No prevertebral fluid or swelling. No
visible canal hematoma.

Disc levels: Multilevel degenerative changes are present including
disc space narrowing, endplate osteophytes, and facet and
uncovertebral hypertrophy. There is no high-grade osseous
encroachment on the spinal canal. Multilevel significant neural
foraminal stenosis is present.

Upper chest: No apical lung mass. Healed right clavicle fracture
partially imaged.

Other: None.
IMPRESSION: No acute cervical spine fracture.

## 2019-10-07 IMAGING — CT CT HEAD W/O CM
3 series · 16 of 47 positions shown, 19 images · non-contrast
Comparison: None.

CLINICAL DATA: Multiple falls

EXAM:
CT HEAD WITHOUT CONTRAST
TECHNIQUE: Contiguous axial images were obtained from the base of the skull
through the vertex without intravenous contrast.

[Series 3: head (person_name) (person_name) · axial · 0.48mm/px · z∈[-153,-13]mm · 10 of 34 slices shown, 13 images]
[im 3/34  brain]
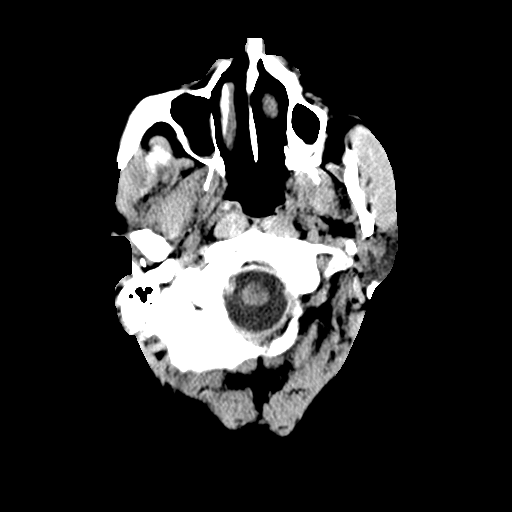
[im 3/34  bone]
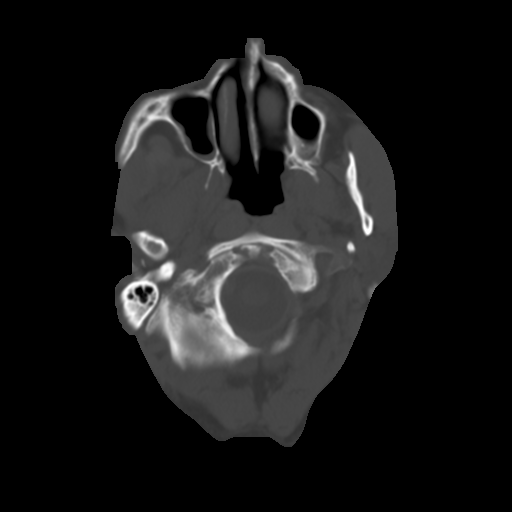
[im 6/34  brain]
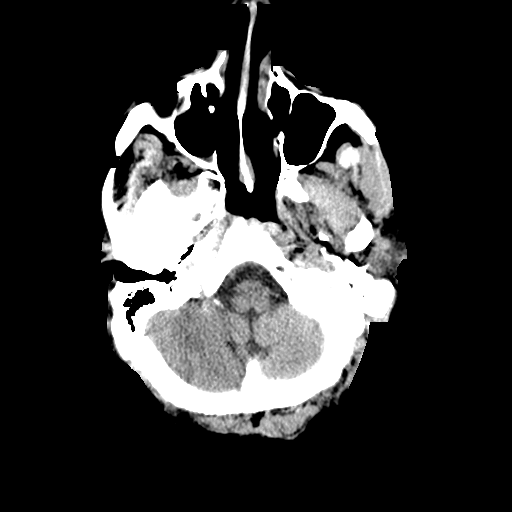
[im 10/34  brain]
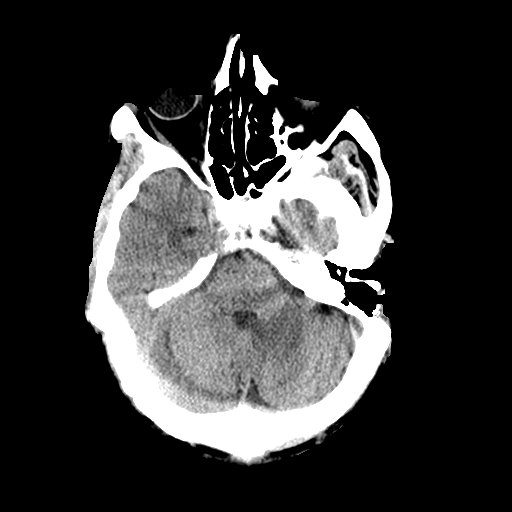
[im 12/34  brain]
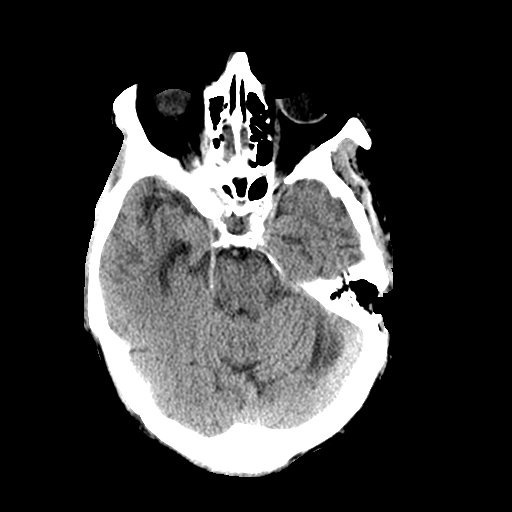
[im 15/34  brain]
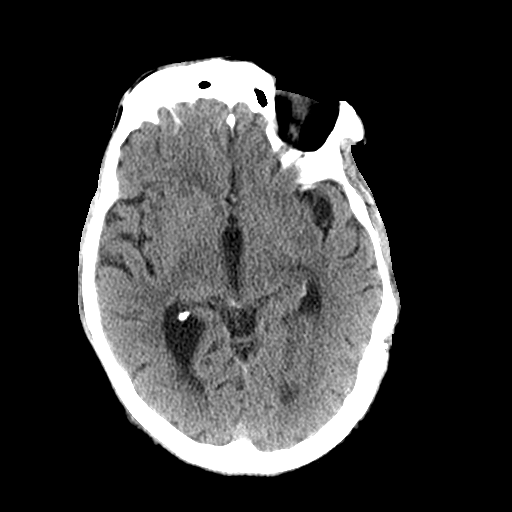
[im 15/34  bone]
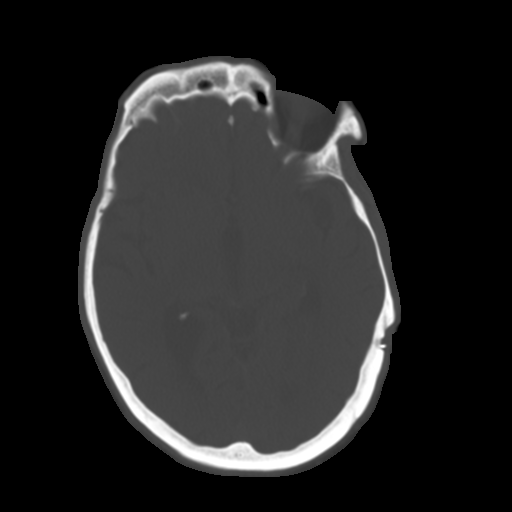
[im 19/34  brain]
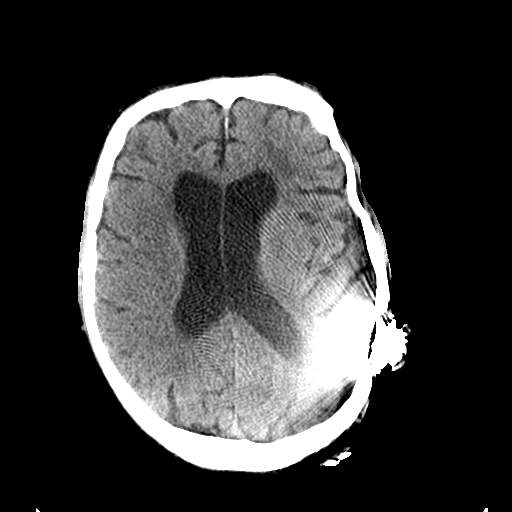
[im 22/34  brain]
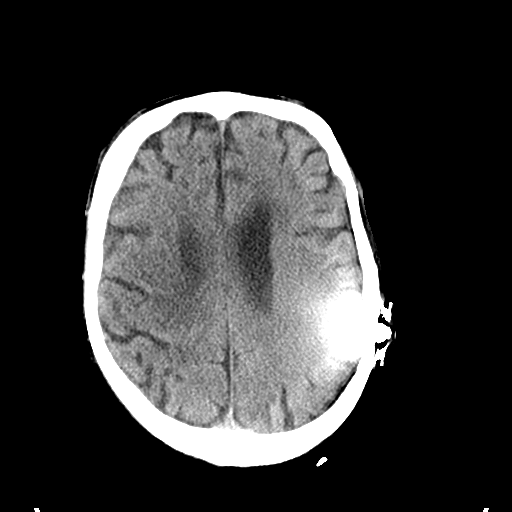
[im 26/34  brain]
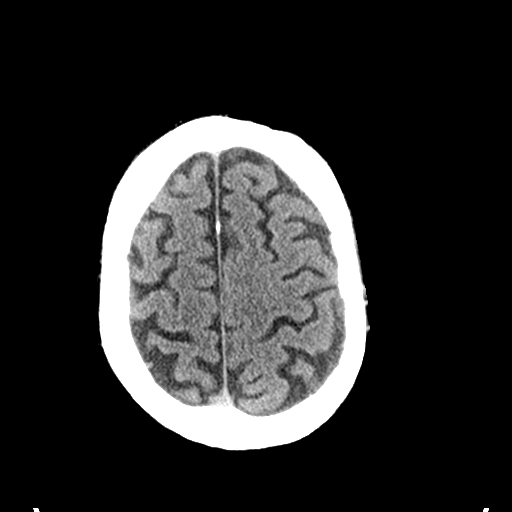
[im 28/34  brain]
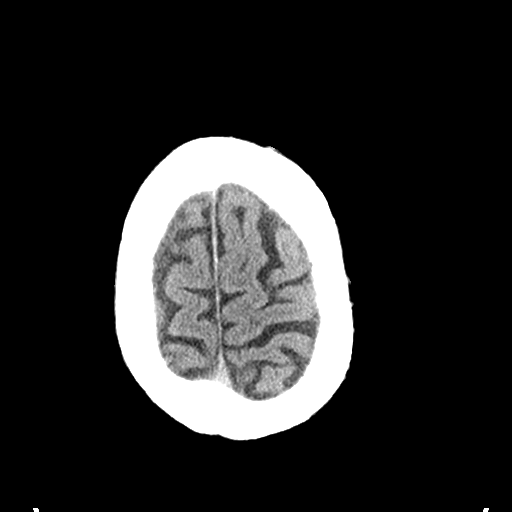
[im 28/34  bone]
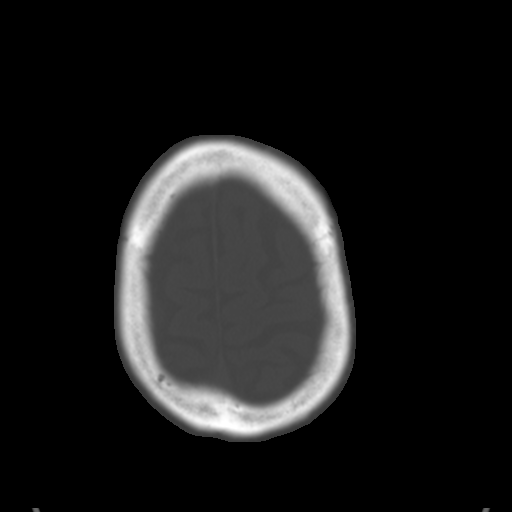
[im 31/34  brain]
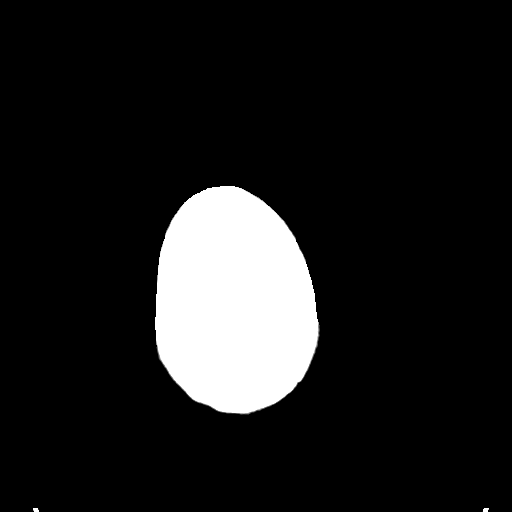

[Series 4: coronal soft tissue · coronal · 0.34mm/px · 3 of 67 slices shown]
[im 23/67  brain]
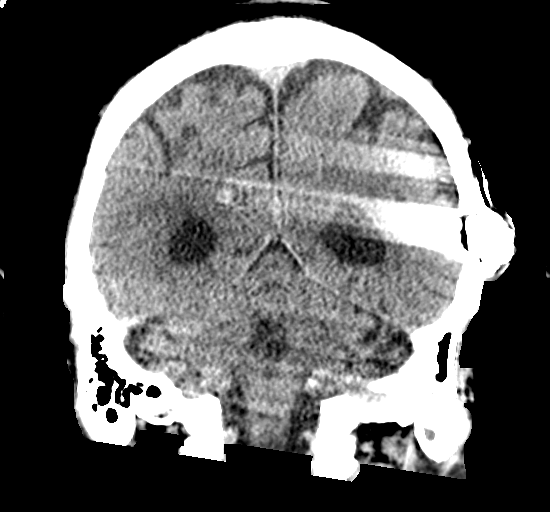
[im 30/67  brain]
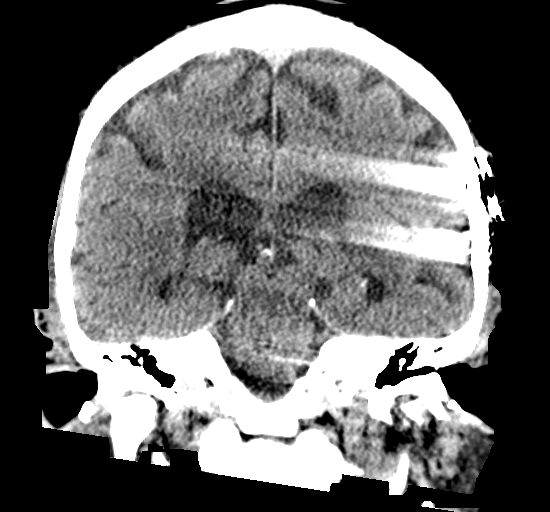
[im 37/67  brain]
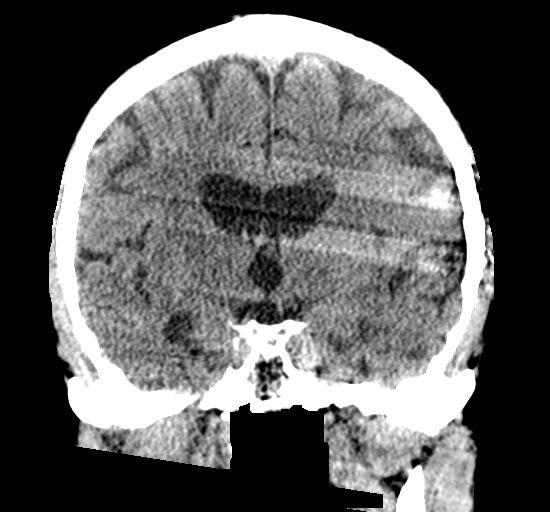

[Series 5: sagittal soft tissue · sagittal · 0.34mm/px · 3 of 55 slices shown]
[im 21/55  brain]
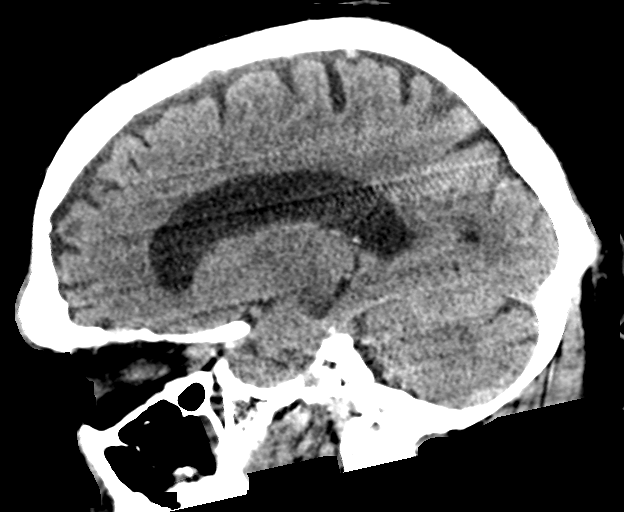
[im 28/55  brain]
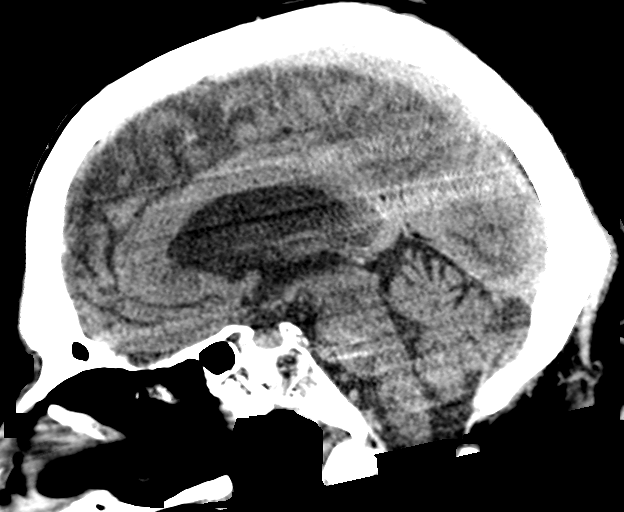
[im 35/55  brain]
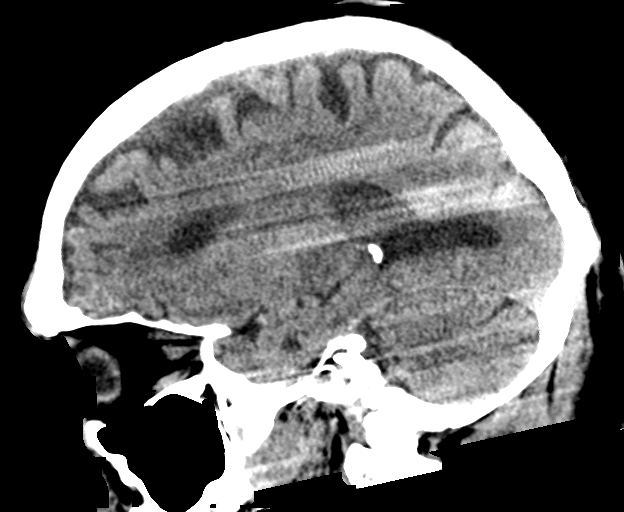

[16 of 47 positions shown; findings below may reference images not displayed]

FINDINGS: There is streak artifact at the level of left cochlear implant
apparatus. Below findings are within this limitation.

Brain: There is no acute intracranial hemorrhage, mass effect, or
edema. Gray-white differentiation is preserved. There is no
extra-axial fluid collection. Prominence of the ventricles and sulci
reflects mild generalized parenchymal volume loss. Patchy
hypoattenuation in the supratentorial white matter is nonspecific
may reflect mild chronic microvascular ischemic changes.

Vascular: There is atherosclerotic calcification at the skull base.

Skull: Calvarium is unremarkable apart from below.

Sinuses/Orbits: Mild mucosal thickening. No significant orbital
finding.

Other: Left canal wall up mastoidectomy with cochlear implant.
IMPRESSION: No evidence of acute intracranial injury.

## 2019-10-07 IMAGING — MR MR CERVICAL SPINE W/O CM
5 series · 38 of 48 positions shown · non-contrast
Comparison: CT scan dated [DATE]

CLINICAL DATA: Hypertension, diabetes, back pain with generalized
weakness.

EXAM:
MRI CERVICAL SPINE WITHOUT CONTRAST
TECHNIQUE: Multiplanar, multisequence MR imaging of the cervical spine was
performed. No intravenous contrast was administered.

[Series 1: T2 · sagittal · 3.0mm · 0.62mm/px · 7 of 15 slices shown (1 of 2)]
[im 1/15]
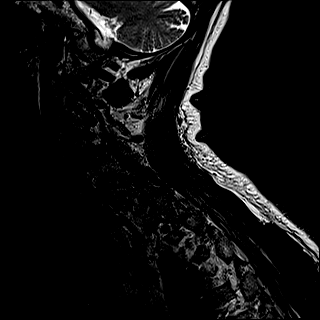
[im 3/15]
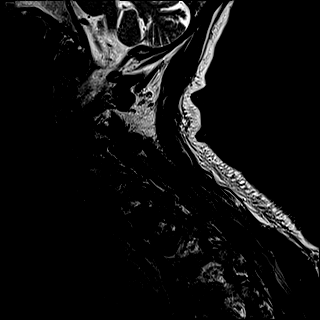
[im 5/15]
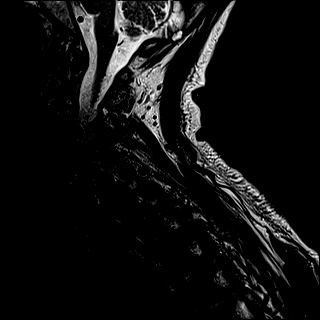
[im 8/15]
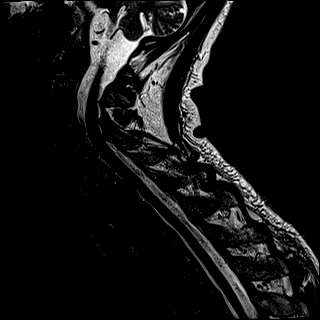
[im 10/15]
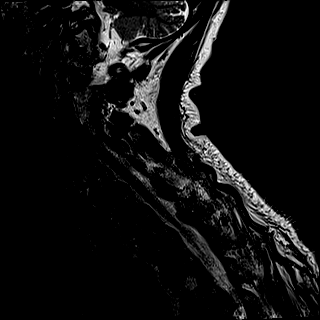
[im 12/15]
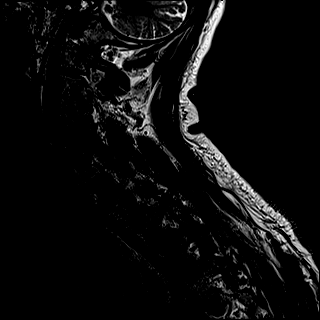
[im 15/15]
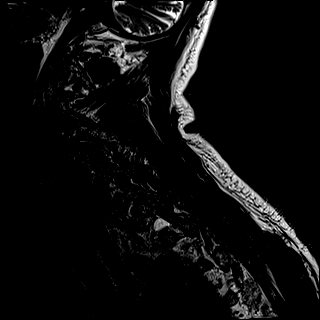

[Series 2: FLAIR · sagittal · 3.0mm · 0.78mm/px · 7 of 15 slices shown]
[im 1/15]
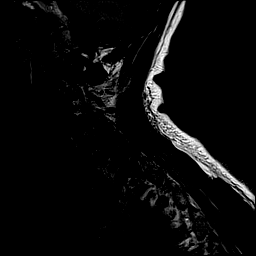
[im 3/15]
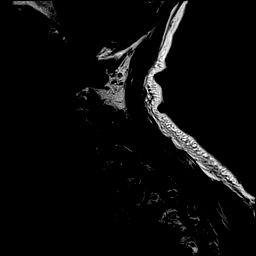
[im 5/15]
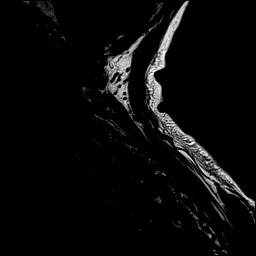
[im 8/15]
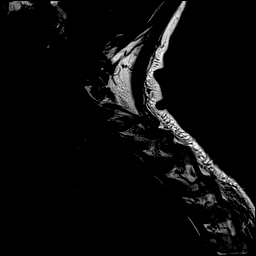
[im 10/15]
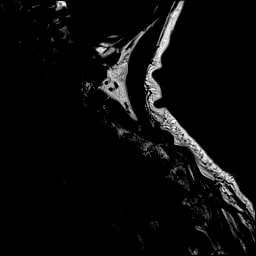
[im 12/15]
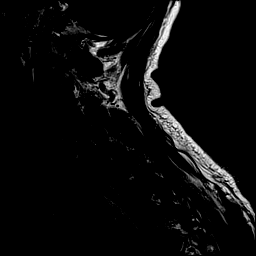
[im 15/15]
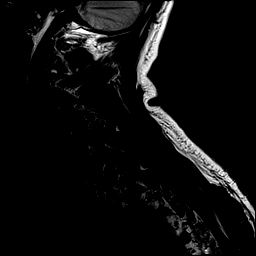

[Series 3: STIR · sagittal · 3.0mm · 0.62mm/px · 7 of 15 slices shown]
[im 1/15]
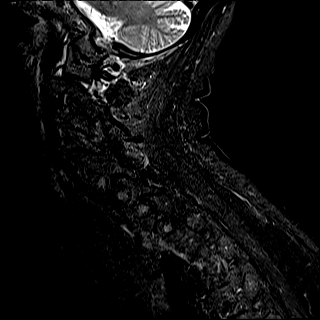
[im 3/15]
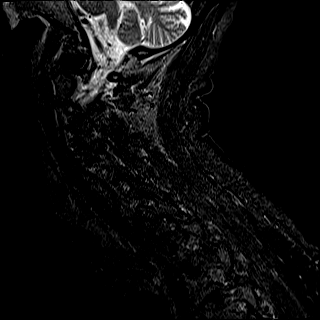
[im 5/15]
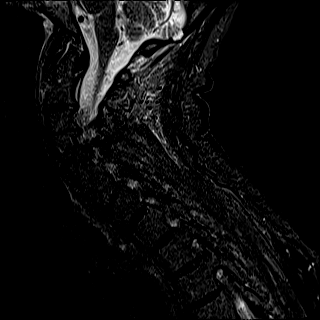
[im 8/15]
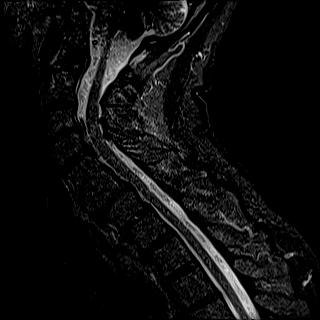
[im 10/15]
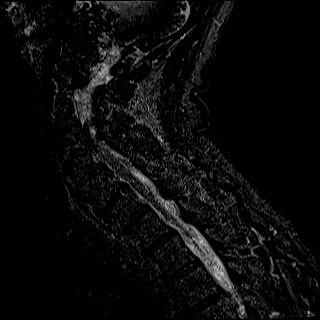
[im 12/15]
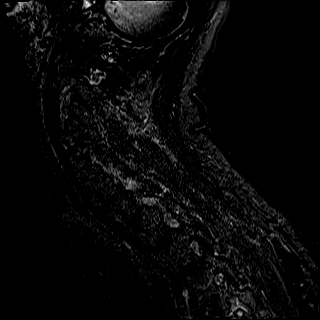
[im 15/15]
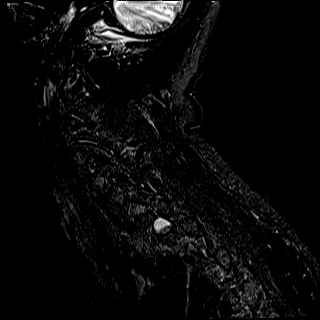

[Series 4: T2 · axial · 3.0mm · 0.70mm/px · z∈[-102,-8]mm · 9 of 28 slices shown (2 of 2)]
[im 1/28]
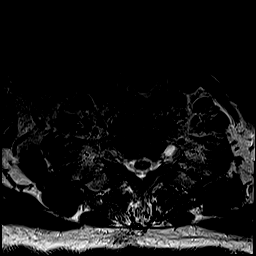
[im 5/28]
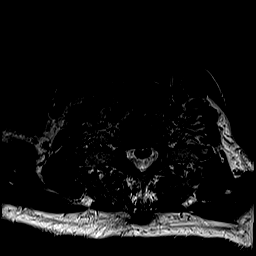
[im 10/28]
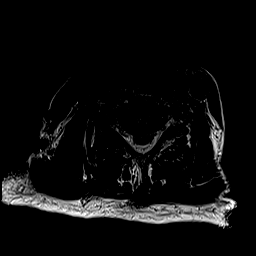
[im 12/28]
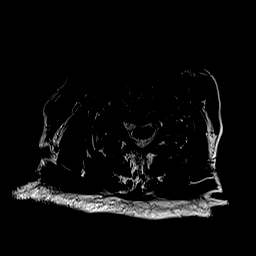
[im 14/28]
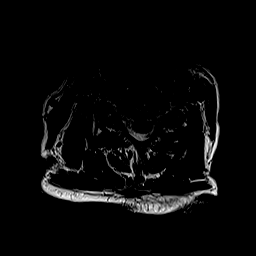
[im 16/28]
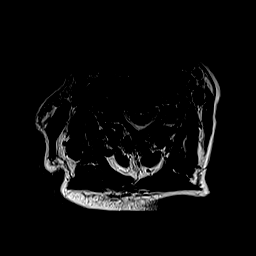
[im 19/28]
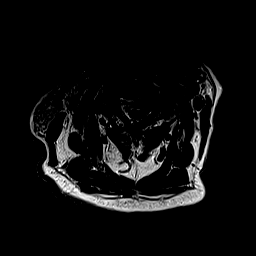
[im 23/28]
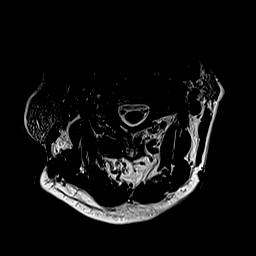
[im 28/28]
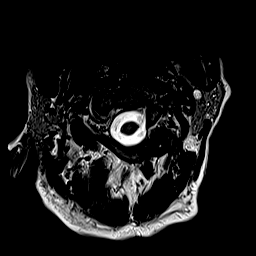

[Series 5: csp ax mpgr · axial · 3.0mm · 0.35mm/px · z∈[-102,-8]mm · 8 of 29 slices shown]
[im 1/29]
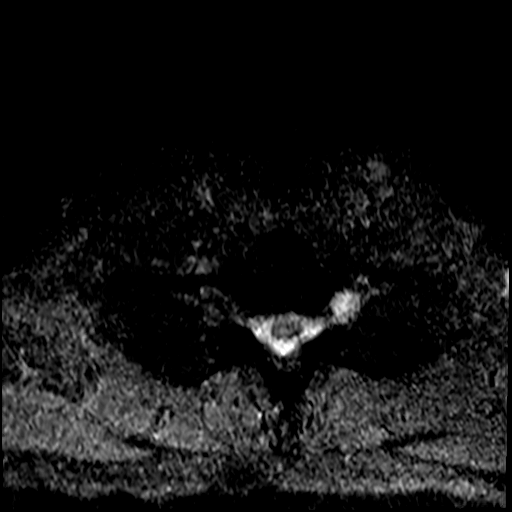
[im 5/29]
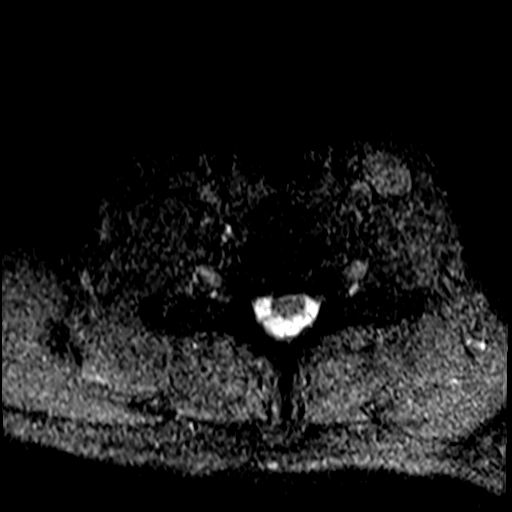
[im 9/29]
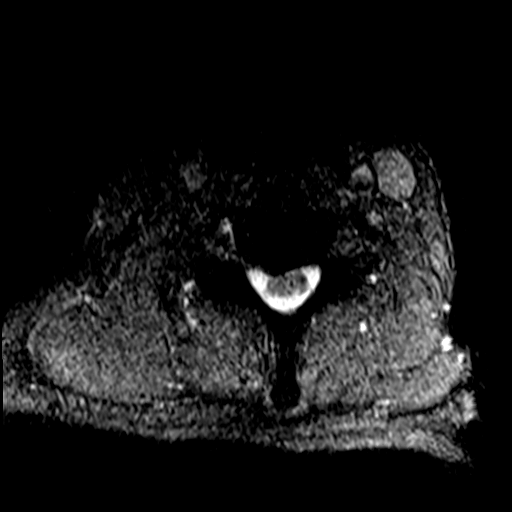
[im 13/29]
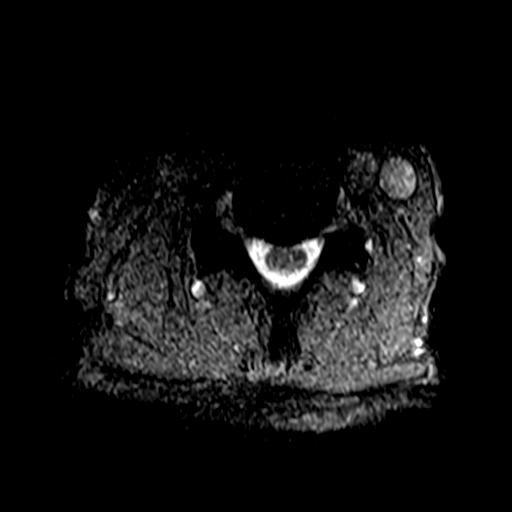
[im 16/29]
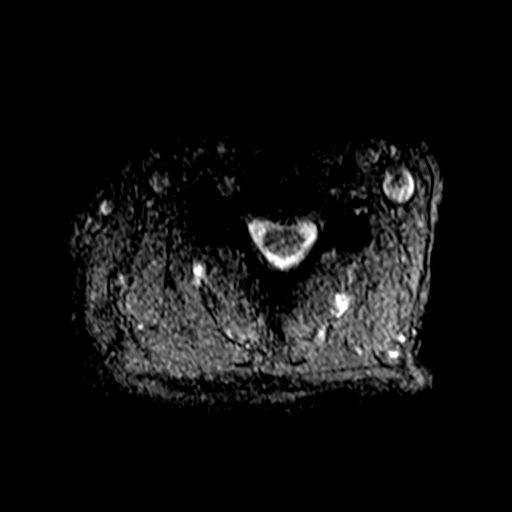
[im 20/29]
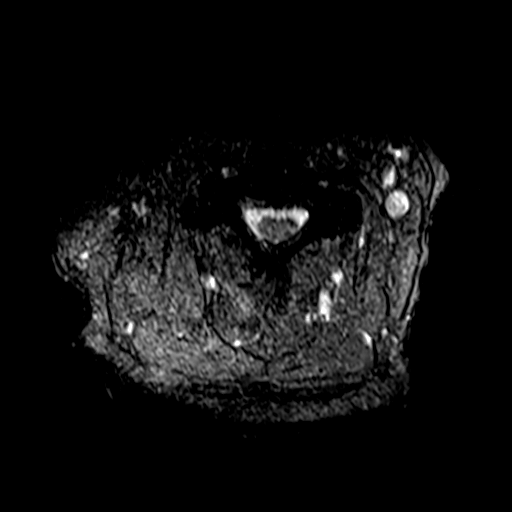
[im 24/29]
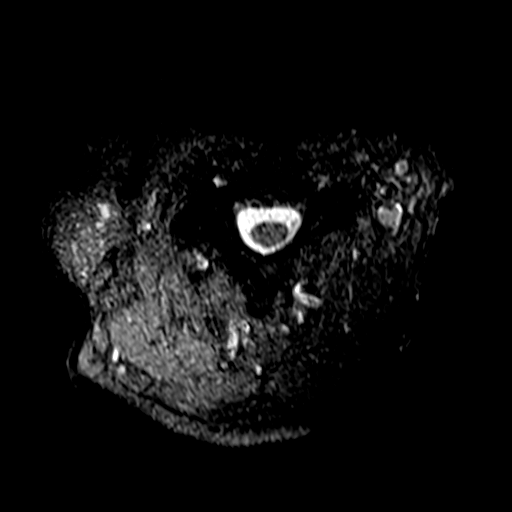
[im 29/29]
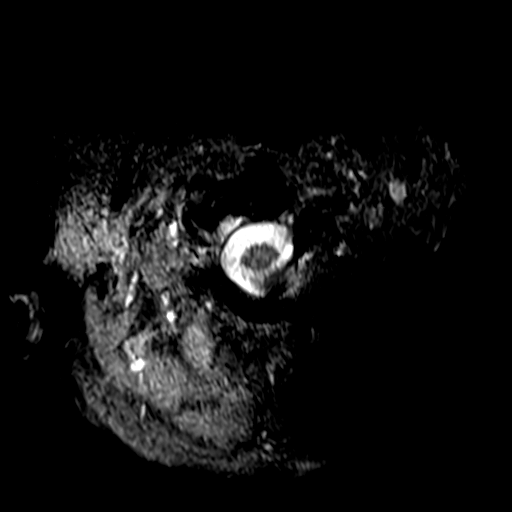

[38 of 48 positions shown; findings below may reference images not displayed]

FINDINGS: Alignment: Mildly exaggerated upper cervical lordosis. 2 mm
degenerative anterolisthesis at C6-7 and C7-T1.

Vertebrae: No significant vertebral marrow edema is identified.
Interbody fusion at C5-6. Small erosion along the posterior odontoid
on image [DATE] eccentric to the left. Disc desiccation throughout
cervical spine.

Cord: No significant abnormal spinal cord signal is observed.

Posterior Fossa, vertebral arteries, paraspinal tissues:
Unremarkable

Disc levels:

C2-3: Moderate right and mild left foraminal stenosis due to facet
and uncinate spurring. Mild disc bulge.

C3-4: Prominent left and moderate right foraminal stenosis and
borderline central narrowing of the thecal sac due to facet and
uncinate spurring along with disc bulge.

C4-5: Prominent bilateral foraminal stenosis mild central narrowing
of the thecal sac due to facet and uncinate spurring with disc
bulge. Possible small left paracentral disc protrusion.

C5-6: Mild bilateral foraminal stenosis due to facet and uncinate
spurring.

C6-7: No impingement. Right paracentral disc protrusion and disc
uncovering.

C7-T1: Mild bilateral foraminal stenosis due to uncinate and facet
spurring.

T1-2: Left perineural cysts noted common no overt impingement.
IMPRESSION: 1. Cervical spondylosis and degenerative disc disease, causing
prominent impingement at C3-4 and C4-5; moderate impingement at
C2-3; and mild impingement at C5-6 and C7-T1, as detailed above.

## 2019-10-07 IMAGING — CR DG LUMBAR SPINE 2-3V
3 series · 3 of 3 positions shown · non-contrast
Comparison: [DATE]

CLINICAL DATA: Weakness. Low back pain. Two falls over the past
week.

EXAM:
LUMBAR SPINE - 2-3 VIEW

[l-spine ap]
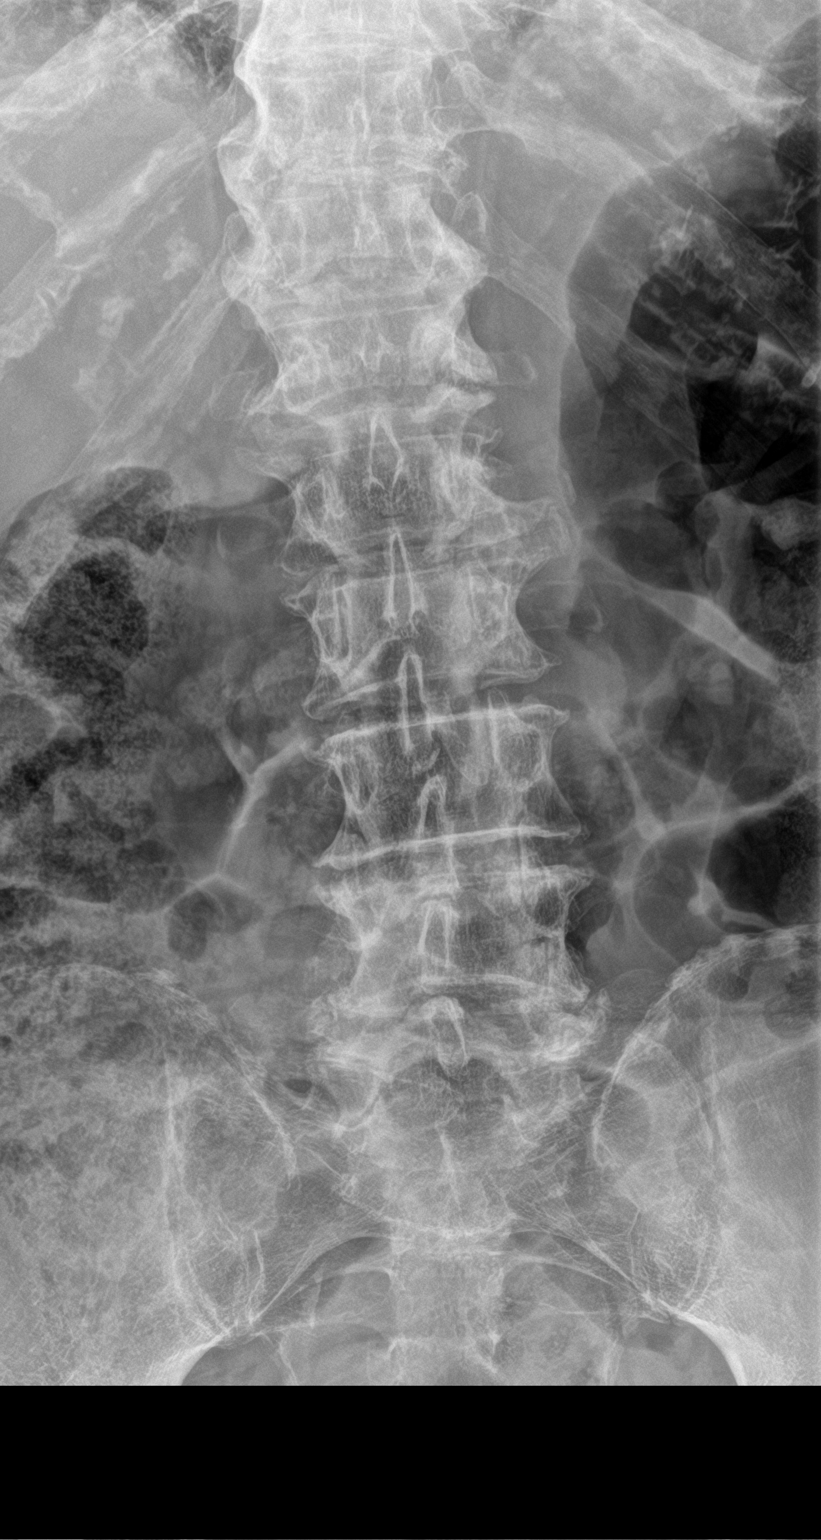

[l-spine lat]
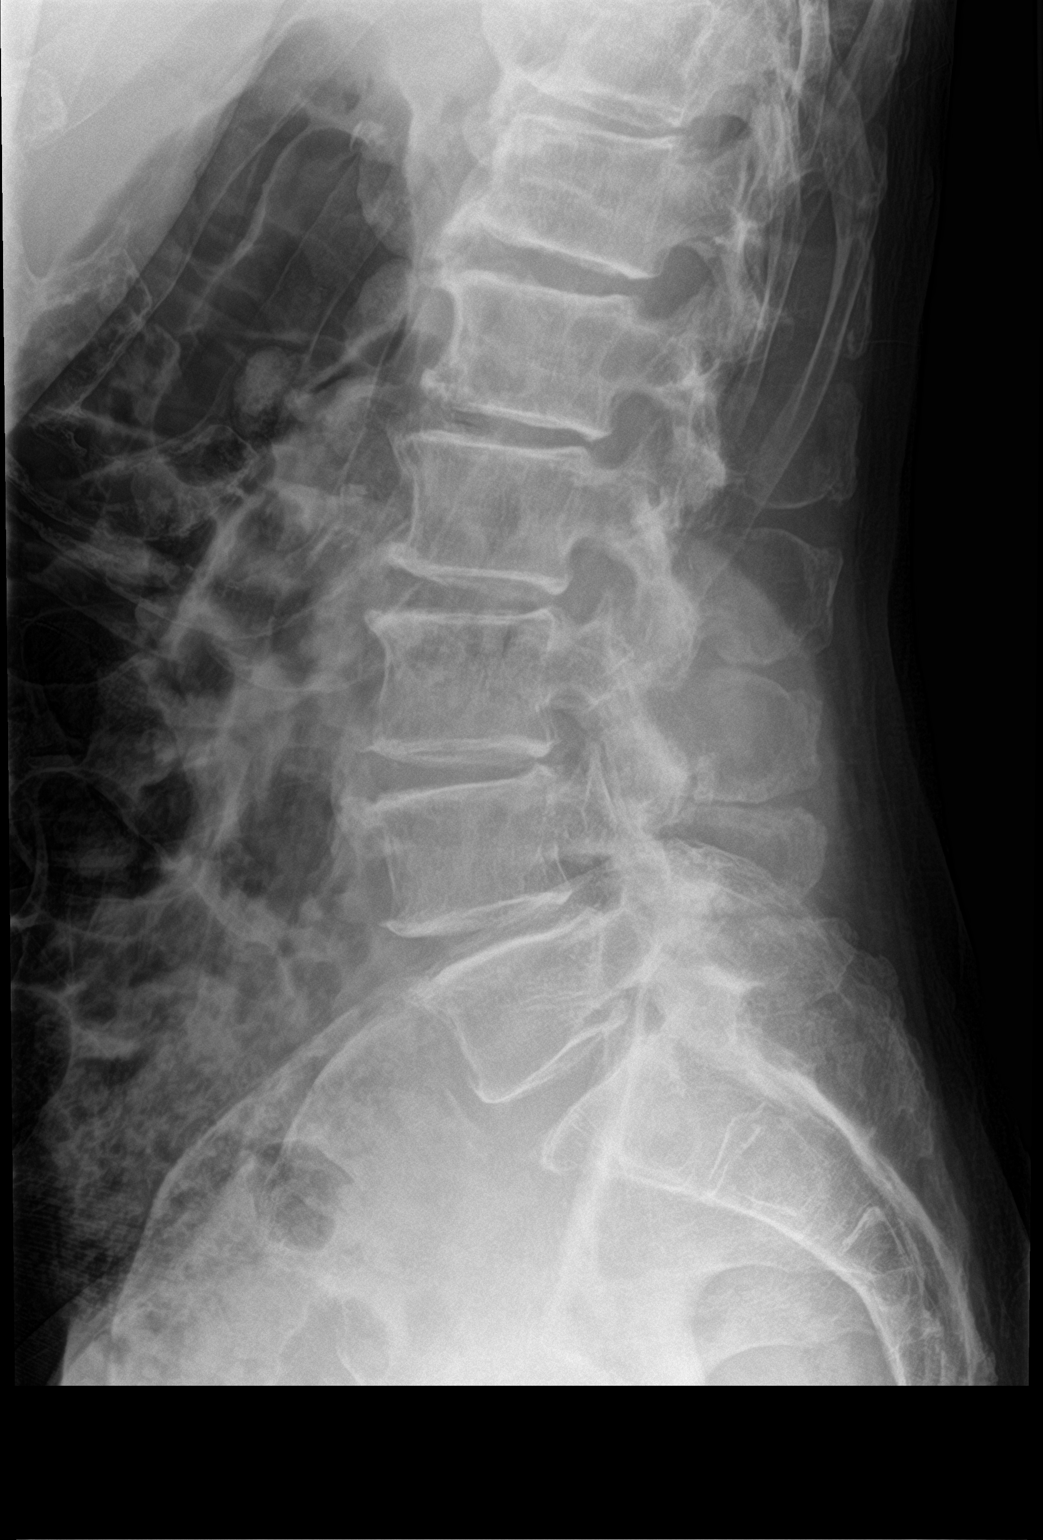

[l-spine spot]
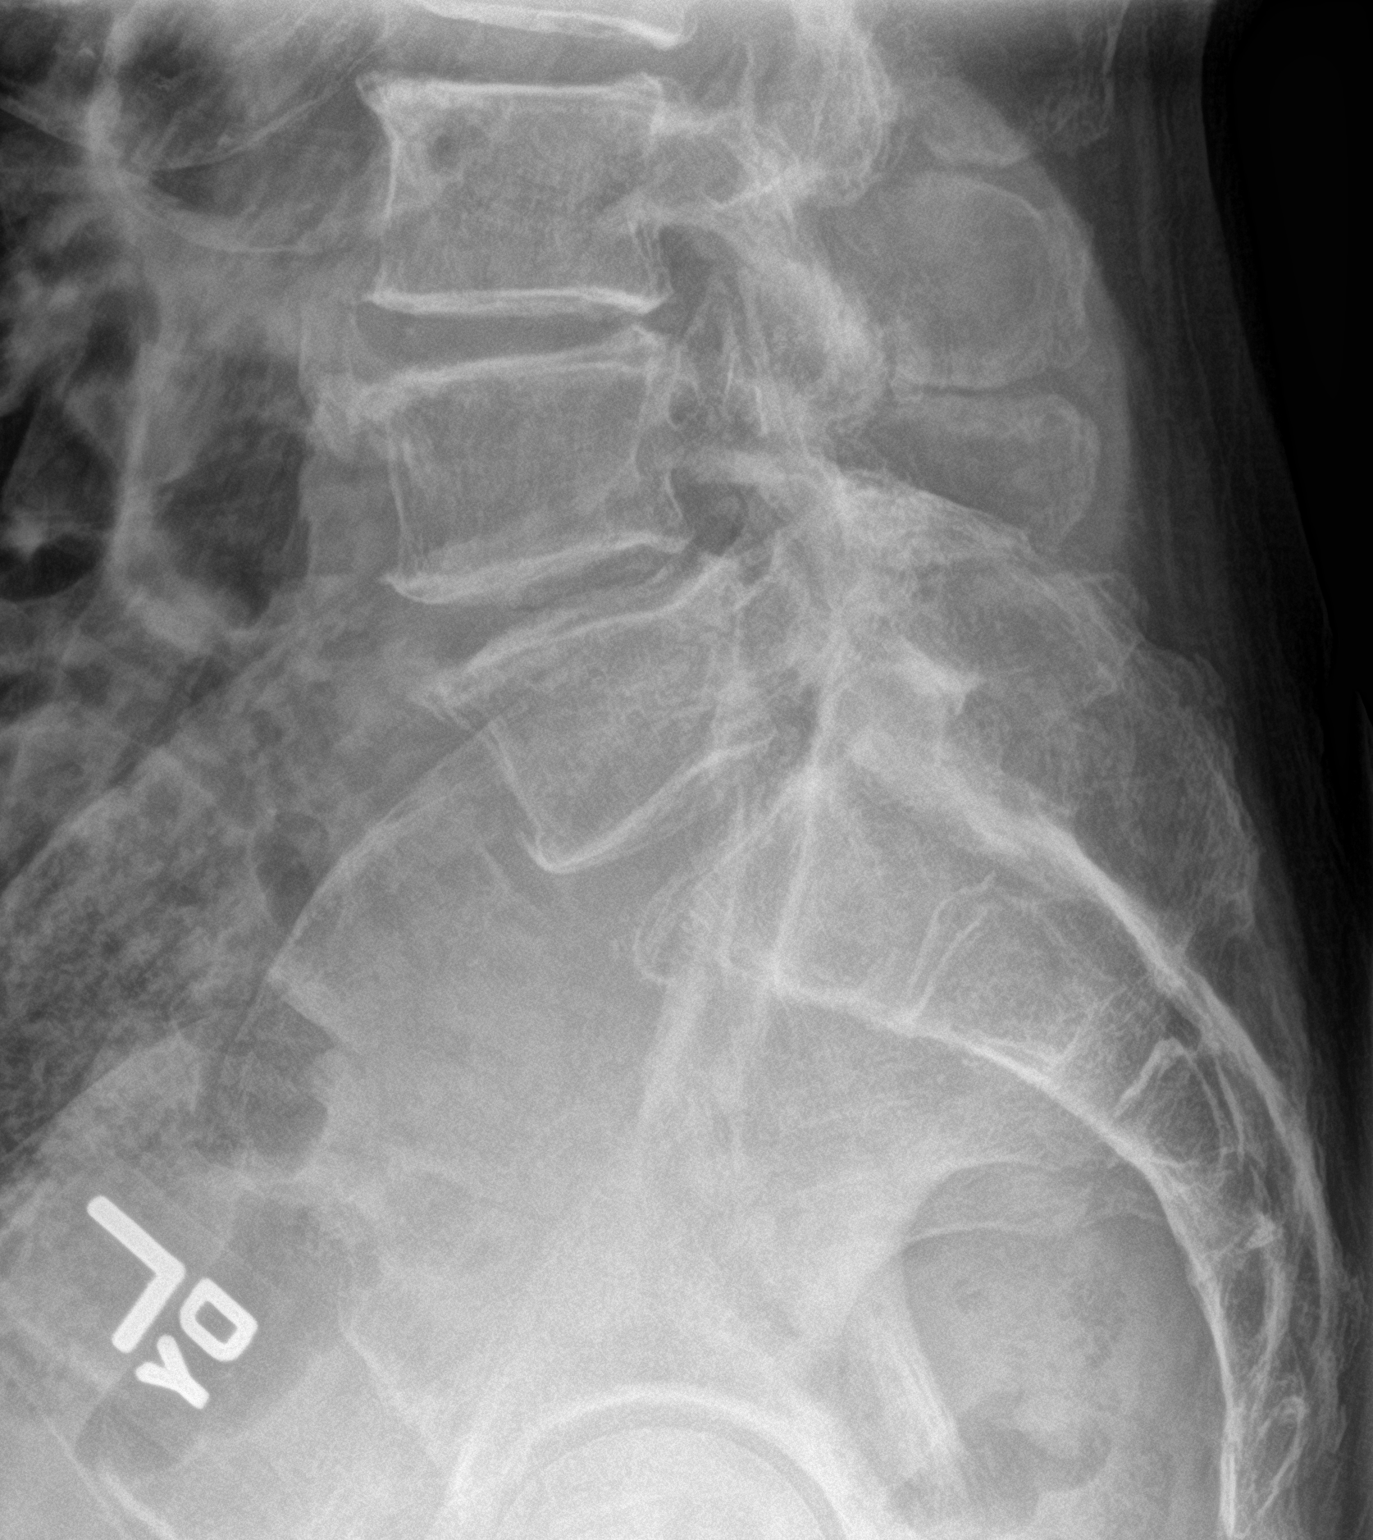

[3 of 3 positions shown; findings below may reference images not displayed]

FINDINGS: Vertebral body heights are maintained. There is moderate spondylosis
of the lumbar spine to include facet arthropathy. Minimal disc space
narrowing throughout the lumbar spine with some sparing of the L5-S1
level. No compression fracture. Subtle grade 1 anterolisthesis of L4
on L5 unchanged due to facet arthropathy.
IMPRESSION: 1.  No acute findings.

2. Moderate spondylosis of the lumbar spine with mild multilevel
disc disease. Subtle stable grade 1 anterolisthesis of L4 on L5 due
to facet arthropathy.

## 2019-10-07 MED ORDER — PENICILLIN G POTASSIUM 20000000 UNITS IJ SOLR
4.0000 10*6.[IU] | INTRAVENOUS | Status: AC
  Administered 2019-10-07 – 2019-10-09 (×7): 4 10*6.[IU] via INTRAVENOUS
  Filled 2019-10-07 (×11): qty 4

## 2019-10-07 MED ORDER — HYDROCODONE-ACETAMINOPHEN 5-325 MG PO TABS
1.0000 | ORAL_TABLET | Freq: Four times a day (QID) | ORAL | Status: DC | PRN
  Administered 2019-10-07 – 2019-10-16 (×11): 1 via ORAL
  Filled 2019-10-07 (×11): qty 1

## 2019-10-07 MED ORDER — ONDANSETRON HCL 4 MG/2ML IJ SOLN
4.0000 mg | Freq: Once | INTRAMUSCULAR | Status: AC
  Administered 2019-10-07: 11:00:00 4 mg via INTRAVENOUS
  Filled 2019-10-07: qty 2

## 2019-10-07 MED ORDER — TIMOLOL MALEATE 0.5 % OP SOLN
1.0000 [drp] | Freq: Two times a day (BID) | OPHTHALMIC | Status: DC
  Administered 2019-10-08 – 2019-10-16 (×17): 1 [drp] via OPHTHALMIC
  Filled 2019-10-07 (×2): qty 5

## 2019-10-07 MED ORDER — FINASTERIDE 5 MG PO TABS
5.0000 mg | ORAL_TABLET | Freq: Every day | ORAL | Status: DC
  Administered 2019-10-08 – 2019-10-16 (×6): 5 mg via ORAL
  Filled 2019-10-07 (×7): qty 1

## 2019-10-07 MED ORDER — PSYLLIUM 95 % PO PACK
1.0000 | PACK | Freq: Every day | ORAL | Status: DC
  Administered 2019-10-13 – 2019-10-16 (×4): 1 via ORAL
  Filled 2019-10-07 (×7): qty 1

## 2019-10-07 MED ORDER — MORPHINE SULFATE (PF) 2 MG/ML IV SOLN
2.0000 mg | INTRAVENOUS | Status: DC | PRN
  Administered 2019-10-08 – 2019-10-14 (×7): 2 mg via INTRAVENOUS
  Filled 2019-10-07 (×7): qty 1

## 2019-10-07 MED ORDER — SODIUM CHLORIDE 0.9 % IV SOLN: INTRAVENOUS | Status: DC

## 2019-10-07 MED ORDER — ONDANSETRON HCL 4 MG PO TABS: 4.0000 mg | ORAL_TABLET | Freq: Four times a day (QID) | ORAL | Status: DC | PRN

## 2019-10-07 MED ORDER — HEPARIN SODIUM (PORCINE) 5000 UNIT/ML IJ SOLN
5000.0000 [IU] | Freq: Three times a day (TID) | INTRAMUSCULAR | Status: DC
  Administered 2019-10-07 – 2019-10-08 (×2): 5000 [IU] via SUBCUTANEOUS
  Filled 2019-10-07 (×3): qty 1

## 2019-10-07 MED ORDER — ONDANSETRON HCL 4 MG/2ML IJ SOLN: 4.0000 mg | Freq: Four times a day (QID) | INTRAMUSCULAR | Status: DC | PRN

## 2019-10-07 MED ORDER — LABETALOL HCL 5 MG/ML IV SOLN
10.0000 mg | Freq: Four times a day (QID) | INTRAVENOUS | Status: DC | PRN
  Administered 2019-10-07 – 2019-10-14 (×7): 10 mg via INTRAVENOUS
  Filled 2019-10-07 (×7): qty 4

## 2019-10-07 MED ORDER — LIDOCAINE 5 % EX PTCH
1.0000 | MEDICATED_PATCH | CUTANEOUS | Status: DC
  Administered 2019-10-08 – 2019-10-16 (×8): 1 via TRANSDERMAL
  Filled 2019-10-07 (×7): qty 1

## 2019-10-07 MED ORDER — CYCLOBENZAPRINE HCL 10 MG PO TABS
5.0000 mg | ORAL_TABLET | Freq: Three times a day (TID) | ORAL | Status: DC
  Administered 2019-10-07 – 2019-10-16 (×14): 5 mg via ORAL
  Filled 2019-10-07 (×18): qty 1

## 2019-10-07 MED ORDER — OXYCODONE-ACETAMINOPHEN 5-325 MG PO TABS
1.0000 | ORAL_TABLET | Freq: Once | ORAL | Status: AC
  Administered 2019-10-07: 08:00:00 1 via ORAL
  Filled 2019-10-07: qty 1

## 2019-10-07 MED ORDER — AMLODIPINE BESYLATE 10 MG PO TABS
10.0000 mg | ORAL_TABLET | Freq: Every day | ORAL | Status: DC
  Administered 2019-10-08: 10:00:00 10 mg via ORAL
  Filled 2019-10-07: qty 1

## 2019-10-07 MED ORDER — INSULIN ASPART 100 UNIT/ML ~~LOC~~ SOLN
4.0000 [IU] | Freq: Three times a day (TID) | SUBCUTANEOUS | Status: DC
  Administered 2019-10-07 – 2019-10-16 (×17): 4 [IU] via SUBCUTANEOUS
  Filled 2019-10-07 (×18): qty 1

## 2019-10-07 MED ORDER — MORPHINE SULFATE (PF) 4 MG/ML IV SOLN
4.0000 mg | Freq: Once | INTRAVENOUS | Status: AC
  Administered 2019-10-07: 4 mg via INTRAVENOUS
  Filled 2019-10-07: qty 1

## 2019-10-07 MED ORDER — INSULIN ASPART 100 UNIT/ML ~~LOC~~ SOLN
0.0000 [IU] | Freq: Three times a day (TID) | SUBCUTANEOUS | Status: DC
  Administered 2019-10-07: 5 [IU] via SUBCUTANEOUS
  Administered 2019-10-08: 2 [IU] via SUBCUTANEOUS
  Administered 2019-10-08 (×2): 3 [IU] via SUBCUTANEOUS
  Administered 2019-10-09 (×2): 2 [IU] via SUBCUTANEOUS
  Administered 2019-10-09 – 2019-10-11 (×4): 3 [IU] via SUBCUTANEOUS
  Administered 2019-10-12 (×2): 2 [IU] via SUBCUTANEOUS
  Administered 2019-10-12 – 2019-10-13 (×3): 3 [IU] via SUBCUTANEOUS
  Administered 2019-10-14: 2 [IU] via SUBCUTANEOUS
  Administered 2019-10-14: 3 [IU] via SUBCUTANEOUS
  Administered 2019-10-15 (×2): 2 [IU] via SUBCUTANEOUS
  Administered 2019-10-16: 3 [IU] via SUBCUTANEOUS
  Filled 2019-10-07 (×20): qty 1

## 2019-10-07 MED ORDER — TAMSULOSIN HCL 0.4 MG PO CAPS
0.8000 mg | ORAL_CAPSULE | Freq: Every day | ORAL | Status: DC
  Administered 2019-10-08 – 2019-10-16 (×4): 0.8 mg via ORAL
  Filled 2019-10-07 (×5): qty 2

## 2019-10-07 NOTE — ED Provider Notes (Signed)
Patient signed out to me by Dr. Charna Archer with request to follow-up on labs and imaging.  Patient with acute on chronic low back pain.  Specified with patient that it is in fact lumbar pain that has worsened but he states he also has cervical pain and some thoracic pain.   Surprising elevated white blood cell count of 28 on labs as well as a creatinine of 2.44 with a BUN of 66.  These appear to be new from prior labs.  Also with decrease in platelet count to 121 from a normal value 4 months ago.  Concerning for possible epidural abscess.  The patient has no headache or neck pain or altered mental status or fever to suggest meningitis.  Has not recently been on steroids.  Patient has not been tachycardic and has no fever.  Thus far doubt sepsis, regardless we will send lactic blood cultures urine cultures will obtain further imaging.  CT and x-ray imaging tests are overall reassuring.  We will need to obtain MRI to evaluate for epidural abscess.  Unfortunately the patient does complain of essentially diffuse pain in his back and neck which will require doing the entire spine.  The patient does have a cochlear implant however this was placed within the last 2 years so should be compatible with MRI.  Will make MRI staff aware   ----------------------------------------- 11:57 AM on 10/07/2019 -----------------------------------------  MRI results negative for epidural abscess.  Will admit to the hospital service for treatment of acute kidney injury and further evaluation of elevated white blood cell count   Lavonia Drafts, MD 10/07/19 1157

## 2019-10-07 NOTE — Progress Notes (Signed)
Client noted to be choking on thin liquids. Retried with 5cc x 2 times but he did poorly. Was eventually able to cough and clear same, provider informed. BP elevated, medicated per MAR. Noted to be cold and clammy, CBG ok at this time.

## 2019-10-07 NOTE — ED Notes (Signed)
Pt's O2 dropped to 88% while sleeping, pt reports that he does not have a hx of sleep apnea. Placed on 2L [place], O2 now 94%

## 2019-10-07 NOTE — ED Notes (Signed)
Pt reports pain 8/10 in right wrist/hand and 4/10 pain in lower back. Dr. Corky Downs informed, verbal given for 4mg  zofran IV and morphine 4mg  IV stat

## 2019-10-07 NOTE — ED Notes (Signed)
Pt's wife updated on pt's status and plan of care

## 2019-10-07 NOTE — Progress Notes (Addendum)
PHARMACY - PHYSICIAN COMMUNICATION CRITICAL VALUE ALERT - BLOOD CULTURE IDENTIFICATION (BCID)  Dustin Harrison is an 86 y.o. male who presented to [facility] on 10/07/2019 with a chief complaint of worsening low back pain and generalized weakness. Patient noted to have AKI and noted to have lumbar spondylosis and degenerative disc disease.  Assessment: GPC 4/4 Bottles (anaerobic/aerobic); BCID Streptococcus agalactiae group B. WBC 28.1 and afebrile.   Name of physician (or Provider) Contacted: Rufina Falco, NP   Current antibiotics: None   Changes to prescribed antibiotics recommended:  Penicillin G 4 million units IV Q4H given leukocytosis.   No results found for this or any previous visit.  [name] 10/07/2019  7:35 PM

## 2019-10-07 NOTE — ED Notes (Signed)
Attempted to call report to floor RN, was told that nurse would have to call me back

## 2019-10-07 NOTE — ED Notes (Signed)
Pt given [place] sandwich tray and cola, ok per Dr. Corky Downs

## 2019-10-07 NOTE — ED Notes (Signed)
Ok to d/c repeat lactic per Dr. Corky Downs

## 2019-10-07 NOTE — ED Notes (Signed)
Attempted to call pt's wife but did not get an answer

## 2019-10-07 NOTE — ED Notes (Signed)
Pt requesting to eat meal before he is taken upstairs.

## 2019-10-07 NOTE — H&P (Signed)
History and Physical    Dustin Harrison [id number] DOB: 05-28-1932 DOA: 10/07/2019  PCP: Jinny Sanders, MD   Patient coming from: [facility] independent living  I have personally briefly reviewed patient's old medical records in [place]  Chief Complaint: Back pain  HPI: Dustin Harrison is a 86 y.o. male with medical history significant for hypertension, diabetes mellitus and chronic low back pain who presents to the emergency room for evaluation of worsening low back pain and generalized weakness.  Patient also complains of pain in his neck which is new.  Per EMS patient has had 2 falls in the past week  but patient is unable to recall the details of the fall, he was unable to state if he hit his head or his neck when he fell. He denies having any fever, cough, chest pain, shortness of breath, abdominal pain, nausea, vomiting, diarrhea or any urinary symptoms. He denies having any focal weakness in his lower extremities, he denies urinary retention or incontinence. MRI of the lumbar spine showed lumbar spondylosis and degenerative disc disease, causing moderate impingement at L4-5 and mild impingement at L3-4 and L5-S1. Cortical thinning of the right kidney lower pole suggesting right renal atrophy. MRI of the cervical spine showed cervical spondylosis and degenerative disc disease, causing prominent impingement at C3-4 and C4-5; moderate impingement at C2-3; and mild impingement at C5-6 and C7-T1 His labs revealed leukocytosis.  Chest x-ray shows no acute findings and urinalysis is sterile   ED Course: Patient seen in the emergency room for worsening low back pain and neck pain.  Labs revealed worsening of his renal function from baseline, patient noted to have a serum creatinine of 2.4 compared to his baseline of 1.4.  BUN was also elevated at 60. He will be admitted to the hospital for further evaluation.  Review of Systems: As per HPI otherwise 10 point review of systems  negative.    Past Medical History:  Diagnosis Date  . Abnormal glucose   . Arthritis   . Baker's cyst of knee    left  . BPH (benign prostatic hypertrophy)   . Cough    because of "tight" esophagus  . Diabetes mellitus   . Glaucoma   . HOH (hard of hearing)   . Hypertension   . Pheochromocytoma of right adrenal gland   . Ulcer   . Wears hearing aid    bilateral    Past Surgical History:  Procedure Laterality Date  . adrenaletomy    . CATARACT EXTRACTION W/PHACO Left 10/08/2015   Procedure: CATARACT EXTRACTION PHACO AND INTRAOCULAR LENS PLACEMENT ([place]) left eye;  Surgeon: Leandrew Koyanagi, MD;  Location: [facility];  Service: Ophthalmology;  Laterality: Left;  MALYUGIN  . HERNIA REPAIR    . SQUAMOUS CELL CARCINOMA EXCISION  03-2006   left ear  . TONSILLECTOMY       reports that he has never smoked. He has never used smokeless tobacco. He reports current alcohol use of about 7.0 standard drinks of alcohol per week. He reports that he does not use drugs.  No Known Allergies  Family History  Problem Relation Age of Onset  . Alcohol abuse Mother   . Coronary artery disease Mother   . Brain cancer Father        tumor  . Diabetes Brother      Prior to Admission medications   Medication Sig Start Date End Date Taking? Authorizing Provider  acetaminophen (TYLENOL) 500 MG tablet  Take 500 mg by mouth daily as needed.    [provider]  atorvastatin (LIPITOR) 20 MG tablet TAKE ONE TABLET EVERY DAY 07/31/19   Bedsole, Amy E, MD  finasteride (PROSCAR) 5 MG tablet TAKE ONE TABLET EVERY DAY 07/31/19   Bedsole, Amy E, MD  latanoprost (XALATAN) 0.005 % ophthalmic solution  03/23/18   [provider]  losartan (COZAAR) 100 MG tablet TAKE ONE TABLET EVERY DAY 07/31/19   Bedsole, Amy E, MD  predniSONE (DELTASONE) 20 MG tablet 3 tabs by mouth daily x 3 days, then 2 tabs by mouth daily x 2 days then 1 tab by mouth daily x 2 days 05/25/19   Diona Browner, Amy E, MD    Psyllium (METAMUCIL FIBER PO) Take 2 Scoops by mouth daily.    [provider]  tamsulosin (FLOMAX) 0.4 MG CAPS capsule TAKE 2 CAPSULES EVERY DAY 07/31/19   Bedsole, Amy E, MD  timolol (TIMOPTIC) 0.5 % ophthalmic solution Place 1 drop into both eyes 2 (two) times daily.  10/30/11   [provider]    Physical Exam: Vitals:   10/07/19 0338 10/07/19 0845 10/07/19 1015 10/07/19 1130  BP: 126/67 (!) 182/104  (!) 156/96  Pulse: 72 96 (!) 103   Resp: 18 (!) 26 (!) 26 (!) 23  Temp: 98.2 F (36.8 C)     TempSrc: Oral     SpO2: 97% 96% 93%   Weight: 87.1 kg     Height: 5\' 11"  (1.803 m)        Vitals:   10/07/19 0338 10/07/19 0845 10/07/19 1015 10/07/19 1130  BP: 126/67 (!) 182/104  (!) 156/96  Pulse: 72 96 (!) 103   Resp: 18 (!) 26 (!) 26 (!) 23  Temp: 98.2 F (36.8 C)     TempSrc: Oral     SpO2: 97% 96% 93%   Weight: 87.1 kg     Height: 5\' 11"  (1.803 m)       Constitutional: NAD, alert and oriented x 3 Eyes: PERRL, lids and conjunctivae normal ENMT: Mucous membranes are dry.  Hard of hearing Neck: normal, supple, no masses, no thyromegaly Respiratory: clear to auscultation bilaterally, no wheezing, no crackles. Normal respiratory effort. No accessory muscle use.  Cardiovascular: Regular rate and rhythm, no murmurs / rubs / gallops. No extremity edema. 2+ pedal pulses. No carotid bruits.  Abdomen: no tenderness, no masses palpated. No hepatosplenomegaly. Bowel sounds positive.  Central adiposity Musculoskeletal: no clubbing / cyanosis. No joint deformity upper and lower extremities.  Skin: no rashes, lesions, ulcers.  Neurologic: No gross focal neurologic deficit.  Able to move all his extremities Psychiatric: Flat affect.   Labs on Admission: I have personally reviewed following labs and imaging studies  CBC: Recent Labs  Lab 10/07/19 0727  WBC 28.1*  NEUTROABS 24.3*  HGB 14.4  HCT 43.4  MCV 93.3  PLT 123XX123*   Basic Metabolic Panel: Recent Labs   Lab 10/07/19 0727  NA 134*  K 5.0  CL 102  CO2 23  GLUCOSE 140*  BUN 66*  CREATININE 2.44*  CALCIUM 8.6*   GFR: Estimated Creatinine Clearance: 23.6 mL/min (A) (by C-G formula based on SCr of 2.44 mg/dL (H)). Liver Function Tests: No results for input(s): AST, ALT, ALKPHOS, BILITOT, PROT, ALBUMIN in the last 168 hours. No results for input(s): LIPASE, AMYLASE in the last 168 hours. No results for input(s): AMMONIA in the last 168 hours. Coagulation Profile: No results for input(s): INR, PROTIME in the last  168 hours. Cardiac Enzymes: No results for input(s): CKTOTAL, CKMB, CKMBINDEX, TROPONINI in the last 168 hours. BNP (last 3 results) No results for input(s): PROBNP in the last 8760 hours. HbA1C: No results for input(s): HGBA1C in the last 72 hours. CBG: Recent Labs  Lab 10/07/19 1113  GLUCAP 111*   Lipid Profile: No results for input(s): CHOL, HDL, LDLCALC, TRIG, CHOLHDL, LDLDIRECT in the last 72 hours. Thyroid Function Tests: No results for input(s): TSH, T4TOTAL, FREET4, T3FREE, THYROIDAB in the last 72 hours. Anemia Panel: No results for input(s): VITAMINB12, FOLATE, FERRITIN, TIBC, IRON, RETICCTPCT in the last 72 hours. Urine analysis:    Component Value Date/Time   COLORURINE YELLOW (A) 10/07/2019 0727   APPEARANCEUR CLEAR (A) 10/07/2019 0727   LABSPEC 1.020 10/07/2019 0727   PHURINE 5.0 10/07/2019 0727   GLUCOSEU NEGATIVE 10/07/2019 0727   HGBUR MODERATE (A) 10/07/2019 0727   BILIRUBINUR NEGATIVE 10/07/2019 0727   KETONESUR NEGATIVE 10/07/2019 0727   PROTEINUR 100 (A) 10/07/2019 0727   NITRITE NEGATIVE 10/07/2019 0727   LEUKOCYTESUR NEGATIVE 10/07/2019 0727    Radiological Exams on Admission: DG Chest 2 View  Result Date: 10/07/2019 CLINICAL DATA:  Weakness over the past couple days with chronic low back pain. 2 falls over the past week. EXAM: CHEST - 2 VIEW COMPARISON:  None. FINDINGS: Lungs are adequately inflated without focal airspace  consolidation or effusion. Calcified right suprahilar lymph nodes. Cardiomediastinal silhouette is unremarkable. Suggestion of an old right clavicle fracture. No acute fracture. Degenerative change of the spine. IMPRESSION: No acute findings. Electronically Signed   By: Marin Olp M.D.   On: 10/07/2019 07:53   DG Lumbar Spine 2-3 Views  Result Date: 10/07/2019 CLINICAL DATA:  Weakness. Low back pain. Two falls over the past week. EXAM: LUMBAR SPINE - 2-3 VIEW COMPARISON:  03/15/2017 FINDINGS: Vertebral body heights are maintained. There is moderate spondylosis of the lumbar spine to include facet arthropathy. Minimal disc space narrowing throughout the lumbar spine with some sparing of the L5-S1 level. No compression fracture. Subtle grade 1 anterolisthesis of L4 on L5 unchanged due to facet arthropathy. IMPRESSION: 1.  No acute findings. 2. Moderate spondylosis of the lumbar spine with mild multilevel disc disease. Subtle stable grade 1 anterolisthesis of L4 on L5 due to facet arthropathy. Electronically Signed   By: Marin Olp M.D.   On: 10/07/2019 07:56   CT Head Wo Contrast  Result Date: 10/07/2019 CLINICAL DATA:  Multiple falls EXAM: CT HEAD WITHOUT CONTRAST TECHNIQUE: Contiguous axial images were obtained from the base of the skull through the vertex without intravenous contrast. COMPARISON:  None. FINDINGS: There is streak artifact at the level of left cochlear implant apparatus. Below findings are within this limitation. Brain: There is no acute intracranial hemorrhage, mass effect, or edema. Gray-white differentiation is preserved. There is no extra-axial fluid collection. Prominence of the ventricles and sulci reflects mild generalized parenchymal volume loss. Patchy hypoattenuation in the supratentorial white matter is nonspecific may reflect mild chronic microvascular ischemic changes. Vascular: There is atherosclerotic calcification at the skull base. Skull: Calvarium is unremarkable apart  from below. Sinuses/Orbits: Mild mucosal thickening. No significant orbital finding. Other: Left canal wall up mastoidectomy with cochlear implant. IMPRESSION: No evidence of acute intracranial injury. Electronically Signed   By: Macy Mis M.D.   On: 10/07/2019 07:57   CT Cervical Spine Wo Contrast  Result Date: 10/07/2019 CLINICAL DATA:  Multiple falls EXAM: CT CERVICAL SPINE WITHOUT CONTRAST TECHNIQUE: Multidetector CT imaging of the cervical  spine was performed without intravenous contrast. Multiplanar CT image reconstructions were also generated. COMPARISON:  None. FINDINGS: Alignment: Accentuation of cervical lordosis is likely related to positioning. Mild anterolisthesis at C5-C6. Skull base and vertebrae: There is no acute cervical spine fracture. No destructive osseous lesion. Soft tissues and spinal canal: No prevertebral fluid or swelling. No visible canal hematoma. Disc levels: Multilevel degenerative changes are present including disc space narrowing, endplate osteophytes, and facet and uncovertebral hypertrophy. There is no high-grade osseous encroachment on the spinal canal. Multilevel significant neural foraminal stenosis is present. Upper chest: No apical lung mass. Healed right clavicle fracture partially imaged. Other: None. IMPRESSION: No acute cervical spine fracture. Electronically Signed   By: Macy Mis M.D.   On: 10/07/2019 08:01   MR Cervical Spine Wo Contrast  Result Date: 10/07/2019 CLINICAL DATA:  Hypertension, diabetes, back pain with generalized weakness. EXAM: MRI CERVICAL SPINE WITHOUT CONTRAST TECHNIQUE: Multiplanar, multisequence MR imaging of the cervical spine was performed. No intravenous contrast was administered. COMPARISON:  CT scan dated 10/07/2019 FINDINGS: Alignment: Mildly exaggerated upper cervical lordosis. 2 mm degenerative anterolisthesis at C6-7 and C7-T1. Vertebrae: No significant vertebral marrow edema is identified. Interbody fusion at C5-6. Small  erosion along the posterior odontoid on image 6/1 eccentric to the left. Disc desiccation throughout cervical spine. Cord: No significant abnormal spinal cord signal is observed. Posterior Fossa, vertebral arteries, paraspinal tissues: Unremarkable Disc levels: C2-3: Moderate right and mild left foraminal stenosis due to facet and uncinate spurring. Mild disc bulge. C3-4: Prominent left and moderate right foraminal stenosis and borderline central narrowing of the thecal sac due to facet and uncinate spurring along with disc bulge. C4-5: Prominent bilateral foraminal stenosis mild central narrowing of the thecal sac due to facet and uncinate spurring with disc bulge. Possible small left paracentral disc protrusion. C5-6: Mild bilateral foraminal stenosis due to facet and uncinate spurring. C6-7: No impingement. Right paracentral disc protrusion and disc uncovering. C7-T1: Mild bilateral foraminal stenosis due to uncinate and facet spurring. T1-2: Left perineural cysts noted common no overt impingement. IMPRESSION: 1. Cervical spondylosis and degenerative disc disease, causing prominent impingement at C3-4 and C4-5; moderate impingement at C2-3; and mild impingement at C5-6 and C7-T1, as detailed above. Electronically Signed   By: Van Clines M.D.   On: 10/07/2019 10:00   MR THORACIC SPINE WO CONTRAST  Result Date: 10/07/2019 CLINICAL DATA:  Back pain in weakness. EXAM: MRI THORACIC SPINE WITHOUT CONTRAST TECHNIQUE: Multiplanar, multisequence MR imaging of the thoracic spine was performed. No intravenous contrast was administered. COMPARISON:  Chest radiograph 10/07/2019 FINDINGS: Alignment:  2 mm degenerative anterolisthesis at T1-2. Vertebrae: Small hemangiomas in the T3 and T6 vertebra. Multilevel Schmorl's nodes. Mild degenerative endplate findings at [id number] and T11-12. Disc desiccation throughout the thoracic spine with loss of disc height most notable at T7-8, T9-10, T10-11, T11-12. No significant  vertebral marrow edema is identified. Cord:  Unremarkable Paraspinal and other soft tissues: Potential mild interstitial accentuation in the lungs although not as readily apparent on the dedicated chest radiograph. Disc levels: Mild disc bulges at T8-9, T9-10, T11-12 along with disc bulge and a shallow right paracentral disc protrusion at T3-4, but no observed impingement. Left foraminal perineural cyst at T1-2. IMPRESSION: 1. Thoracic spondylosis and degenerative disc disease without observed impingement. 2. Equivocal mild interstitial accentuation in the lungs although not as readily apparent on the dedicated chest radio Electronically Signed   By: Van Clines M.D.   On: 10/07/2019 10:06   MR LUMBAR  SPINE WO CONTRAST  Result Date: 10/07/2019 CLINICAL DATA:  Weakness and back pain EXAM: MRI LUMBAR SPINE WITHOUT CONTRAST TECHNIQUE: Multiplanar, multisequence MR imaging of the lumbar spine was performed. No intravenous contrast was administered. COMPARISON:  Lumbar radiographs 10/07/2019 FINDINGS: Despite efforts by the technologist and patient, motion artifact is present on today's exam and could not be eliminated. This reduces exam sensitivity and specificity. Segmentation: The lowest lumbar type non-rib-bearing vertebra is labeled as L5. Alignment:  4 mm degenerative anterolisthesis at L4-5. Vertebrae: Disc desiccation at all levels between L1 and L5 with loss of disc height at L3-4 and L4-5. Type 1 degenerative endplate findings at [id number] with a Schmorl's node along the superior endplate of L4. No lumbar spine fracture or acute bony findings. Conus medullaris and cauda equina: Conus extends to the T12 level. Conus and cauda equina appear normal. Paraspinal and other soft tissues: Cortical thinning in the right kidney lower pole favoring right renal atrophy although only a portion of the right kidney is included. Disc levels: T12-L1: Unremarkable. L1-2: No impingement. Diffuse disc bulge. L2-3: No  impingement. Diffuse disc bulge. L3-4: Mild right foraminal stenosis with mild displacement of the right L3 nerve the right lateral extraforaminal space borderline right subarticular lateral recess stenosis as well as borderline central narrowing of the thecal sac due to disc bulge, intervertebral spurring, and facet arthropathy. L4-5: Moderate central narrowing of the thecal sac with mild to moderate right borderline left foraminal stenosis as well as mild bilateral subarticular lateral recess stenosis due to disc uncovering, disc bulge, intervertebral spurring, facet arthropathy. Small left facet joint effusion. L5-S1: Mild left foraminal stenosis with mild left and borderline right subarticular lateral recess stenosis due to disc bulge facet arthropathy. Small left facet joint effusion. IMPRESSION: 1. Lumbar spondylosis and degenerative disc disease, causing moderate impingement at L4-5 and mild impingement at L3-4 and L5-S1. 2. Cortical thinning of the right kidney lower pole suggesting right renal atrophy. Electronically Signed   By: Van Clines M.D.   On: 10/07/2019 09:51    EKG: Independently reviewed.  Sinus rhythm Premature atrial complexes  Assessment/Plan Principal Problem:   AKI (acute kidney injury) ([place]) Active Problems:   Diabetes mellitus without complication ([place])   Essential hypertension, benign   CKD (chronic kidney disease) stage 2, GFR 60-89 ml/min   Back pain    Acute kidney injury Superimposed on underlying chronic kidney disease stage II At baseline patient has a serum creatinine of 1.4 and today on admission it is 2.4 Acute kidney injury may be secondary to NSAID use but will obtain total CK levels to rule out rhabdomyolysis Hold losartan We will hydrate patient and repeat renal parameters in a.m. If no improvement will consult nephrology   Diabetes mellitus With complications of stage II chronic kidney disease Maintain consistent carbohydrate diet Sliding  scale coverage with NovoLog   Hypertension Uncontrolled and due to pain Start patient on amlodipine 10 mg daily since losartan is on hold for acute kidney injury Place patient on Labetalol 10mg  IV q 6 PRN SBP > 123mmHg    Acute on chronic low back pain Imaging not suggestive of any acute pathology at this time Patient noted to have lumbar spondylosis and degenerative disc disease. Pain control Physical therapy Fall precautions   Leukocytosis Etiology unclear and may be due to stress margination Patient denies recent systemic steroid use No obvious source of infection at this time  DVT prophylaxis: Heparin Code Status: Full Family Communication: Greater than 50% of time  was spent discussing plan of care with patient at the bedside.  He verbalizes understanding and agrees with the plan. Disposition Plan: Back to previous home environment Consults called: Physical therapy    Kadiatou Oplinger MD [facility]     10/07/2019, 11:59 AM

## 2019-10-07 NOTE — ED Provider Notes (Signed)
[facility] Emergency Department Provider Note   ____________________________________________   First MD Initiated Contact with Patient 10/07/19 [phone]     (approximate)  I have reviewed the triage vital signs and the nursing notes.   HISTORY  Chief Complaint Back Pain    HPI Dustin Harrison is a 86 y.o. male with possible history of hypertension, diabetes, and back pain who presents to the ED complaining of generalized weakness and back pain.  Patient reports that he has been feeling weaker than usual over the past couple of days with "my nerves acting up" along with increased pain in his lower back.  This is also been associated with pain in his neck, which patient states is new.  He does state that he has fallen a couple times recently, but is unable to recall the details of the fall and is not sure whether he hit his head or his neck.  He denies any fevers, cough, chest pain, shortness of breath, abdominal pain, nausea, vomiting, diarrhea, dysuria, or hematuria.  He has been taking ibuprofen for his back without any improvement in symptoms.  He denies any focal weakness in his lower extremities and has not had any saddle anesthesia, urinary retention, or urinary incontinence.        Past Medical History:  Diagnosis Date  . Abnormal glucose   . Arthritis   . Baker's cyst of knee    left  . BPH (benign prostatic hypertrophy)   . Cough    because of "tight" esophagus  . Diabetes mellitus   . Glaucoma   . HOH (hard of hearing)   . Hypertension   . Pheochromocytoma of right adrenal gland   . Ulcer   . Wears hearing aid    bilateral    Patient Active Problem List   Diagnosis Date Noted  . Acute neck pain 05/25/2019  . CKD (chronic kidney disease) stage 2, GFR 60-89 ml/min 04/23/2016  . Hearing loss 04/23/2016  . Counseling regarding end of life decision making 04/15/2015  . Essential hypertension, benign 08/22/2012  . Diabetes mellitus without  complication ([place]) [id number]  . HYPERCHOLESTEROLEMIA 05/12/2009  . History of pheochromocytoma 09/07/2007  . GLAUCOMA 09/07/2007  . History of duodenal ulcer 09/07/2007  . BPH (benign prostatic hyperplasia) 09/07/2007  . OSTEOARTHRITIS 09/07/2007    Past Surgical History:  Procedure Laterality Date  . adrenaletomy    . CATARACT EXTRACTION W/PHACO Left 10/08/2015   Procedure: CATARACT EXTRACTION PHACO AND INTRAOCULAR LENS PLACEMENT ([place]) left eye;  Surgeon: Leandrew Koyanagi, MD;  Location: [place];  Service: Ophthalmology;  Laterality: Left;  MALYUGIN  . HERNIA REPAIR    . SQUAMOUS CELL CARCINOMA EXCISION  03-2006   left ear  . TONSILLECTOMY      Prior to Admission medications   Medication Sig Start Date End Date Taking? Authorizing Provider  acetaminophen (TYLENOL) 500 MG tablet Take 500 mg by mouth daily as needed.    [provider]  atorvastatin (LIPITOR) 20 MG tablet TAKE ONE TABLET EVERY DAY 07/31/19   Bedsole, Amy E, MD  finasteride (PROSCAR) 5 MG tablet TAKE ONE TABLET EVERY DAY 07/31/19   Bedsole, Amy E, MD  latanoprost (XALATAN) 0.005 % ophthalmic solution  03/23/18   [provider]  losartan (COZAAR) 100 MG tablet TAKE ONE TABLET EVERY DAY 07/31/19   Bedsole, Amy E, MD  predniSONE (DELTASONE) 20 MG tablet 3 tabs by mouth daily x 3 days, then 2 tabs by mouth daily x  2 days then 1 tab by mouth daily x 2 days 05/25/19   Jinny Sanders, MD  Psyllium (METAMUCIL FIBER PO) Take 2 Scoops by mouth daily.    [provider]  tamsulosin (FLOMAX) 0.4 MG CAPS capsule TAKE 2 CAPSULES EVERY DAY 07/31/19   Bedsole, Amy E, MD  timolol (TIMOPTIC) 0.5 % ophthalmic solution Place 1 drop into both eyes 2 (two) times daily.  10/30/11   [provider]    Allergies Patient has no known allergies.  Family History  Problem Relation Age of Onset  . Alcohol abuse Mother   . Coronary artery disease Mother   . Brain cancer Father        tumor  .  Diabetes Brother     Social History Social History   Tobacco Use  . Smoking status: Never Smoker  . Smokeless tobacco: Never Used  Substance Use Topics  . Alcohol use: Yes    Alcohol/week: 7.0 standard drinks    Types: 7 Glasses of wine per week  . Drug use: No    Review of Systems  Constitutional: No fever/chills.  Positive for generalized weakness. Eyes: No visual changes. ENT: No sore throat. Cardiovascular: Denies chest pain. Respiratory: Denies shortness of breath. Gastrointestinal: No abdominal pain.  No nausea, no vomiting.  No diarrhea.  No constipation. Genitourinary: Negative for dysuria. Musculoskeletal: Positive for back pain.  Positive for neck pain. Skin: Negative for rash. Neurological: Negative for headaches, focal weakness or numbness.  ____________________________________________   PHYSICAL EXAM:  VITAL SIGNS: ED Triage Vitals [10/07/19 0338]  Enc Vitals Group     BP 126/67     Pulse Rate 72     Resp 18     Temp 98.2 F (36.8 C)     Temp Source Oral     SpO2 97 %     Weight 192 lb (87.1 kg)     Height 5\' 11"  (1.803 m)     Head Circumference      Peak Flow      Pain Score 4     Pain Loc      Pain Edu?      Excl. in [place]?     Constitutional: Alert and oriented. Eyes: Conjunctivae are normal. Head: Atraumatic. Nose: No congestion/rhinnorhea. Mouth/Throat: Mucous membranes are moist. Neck: Normal ROM Cardiovascular: Normal rate, regular rhythm. Grossly normal heart sounds. Respiratory: Normal respiratory effort.  No retractions. Lungs CTAB. Gastrointestinal: Soft and nontender. No distention. Genitourinary: deferred Musculoskeletal: No lower extremity tenderness nor edema.  Midline lumbar spinal tenderness. Neurologic:  Normal speech and language. No gross focal neurologic deficits are appreciated. Skin:  Skin is warm, dry and intact. No rash noted. Psychiatric: Mood and affect are normal. Speech and behavior are  normal.  ____________________________________________   LABS (all labs ordered are listed, but only abnormal results are displayed)  Labs Reviewed  BASIC METABOLIC PANEL  CBC WITH DIFFERENTIAL/PLATELET  URINALYSIS, COMPLETE (UACMP) WITH MICROSCOPIC     PROCEDURES  Procedure(s) performed (including Critical Care):  Procedures  ED ECG REPORT I, Blake Divine, the attending physician, personally viewed and interpreted this ECG.   Date: 10/07/2019  EKG Time: 7:23  Rate: 78  Rhythm: normal sinus rhythm, PAC's noted  Axis: Normal  Intervals:none  ST&T Change: None  ____________________________________________   INITIAL IMPRESSION / ASSESSMENT AND PLAN / ED COURSE       86 year old male with history of hypertension, diabetes, and chronic back pain presents to the ED complaining of increasing  generalized weakness as well as multiple falls and increased pain in his back and neck.  He has no focal neurologic deficits on exam and there are no obvious signs of trauma.  Back pain does appear chronic for him and he has no red flag symptoms or concerning findings on exam.  Given his multiple recent falls, we will further assess with CT head, C-spine, and lumbar spine x-rays.  Also assess for potential infection with chest x-ray and UA, screen labs and EKG.  EKG without evidence of arrhythmia or ischemia, remainder of work-up is pending.  Patient turned over to oncoming provider pending results of imaging and labs.  If work-up is unremarkable, patient will be appropriate for discharge home.      ____________________________________________   FINAL CLINICAL IMPRESSION(S) / ED DIAGNOSES  Final diagnoses:  Generalized weakness  Chronic midline low back pain without sciatica     ED Discharge Orders    None       Note:  This document was prepared using Dragon voice recognition software and may include unintentional dictation errors.   Blake Divine, MD 10/07/19 [phone]

## 2019-10-07 NOTE — Plan of Care (Signed)
  Problem: Health Behavior/Discharge Planning: Goal: Ability to manage health-related needs will improve Outcome: Progressing   Problem: Clinical Measurements: Goal: Ability to maintain clinical measurements within normal limits will improve Outcome: Progressing Goal: Cardiovascular complication will be avoided Outcome: Progressing   Problem: Activity: Goal: Risk for activity intolerance will decrease Outcome: Progressing   Problem: Nutrition: Goal: Adequate nutrition will be maintained Outcome: Progressing   Problem: Coping: Goal: Level of anxiety will decrease Outcome: Progressing   Problem: Elimination: Goal: Will not experience complications related to bowel motility Outcome: Progressing Goal: Will not experience complications related to urinary retention Outcome: Progressing   Problem: Pain Managment: Goal: General experience of comfort will improve Outcome: Progressing   Problem: Safety: Goal: Ability to remain free from injury will improve Outcome: Progressing   Problem: Skin Integrity: Goal: Risk for impaired skin integrity will decrease Outcome: Progressing

## 2019-10-07 NOTE — ED Triage Notes (Signed)
First Nurse: patient brought in by ems from [facility] independent. Per EMS patient has chronic lower back pain that has become worse over the last couple of days. Per ems has fallen twice over the past week.

## 2019-10-08 ENCOUNTER — Inpatient Hospital Stay: Payer: Medicare PPO

## 2019-10-08 ENCOUNTER — Inpatient Hospital Stay (HOSPITAL_COMMUNITY)
Admit: 2019-10-08 | Discharge: 2019-10-08 | Disposition: A | Discharge status: Home / Self Care | Payer: Medicare PPO | Attending: Internal Medicine | Admitting: Internal Medicine

## 2019-10-08 DIAGNOSIS — I5181 Takotsubo syndrome: Secondary | ICD-10-CM

## 2019-10-08 DIAGNOSIS — B951 Streptococcus, group B, as the cause of diseases classified elsewhere: Secondary | ICD-10-CM

## 2019-10-08 DIAGNOSIS — I361 Nonrheumatic tricuspid (valve) insufficiency: Secondary | ICD-10-CM

## 2019-10-08 HISTORY — DX: Takotsubo syndrome: I51.81

## 2019-10-08 LAB — BASIC METABOLIC PANEL
Anion gap: 8 (ref 5–15)
BUN: 48 mg/dL — ABNORMAL HIGH (ref 8–23)
CO2: 23 mmol/L (ref 22–32)
Calcium: 8.3 mg/dL — ABNORMAL LOW (ref 8.9–10.3)
Chloride: 104 mmol/L (ref 98–111)
Creatinine, Ser: 1.61 mg/dL — ABNORMAL HIGH (ref 0.61–1.24)
GFR calc Af Amer: 45 mL/min — ABNORMAL LOW (ref >60)
GFR calc non Af Amer: 38 mL/min — ABNORMAL LOW (ref >60)
Glucose, Bld: 148 mg/dL — ABNORMAL HIGH (ref 70–99)
Potassium: 4.2 mmol/L (ref 3.5–5.1)
Sodium: 135 mmol/L (ref 135–145)

## 2019-10-08 LAB — GLUCOSE, CAPILLARY
Glucose-Capillary: 164 mg/dL — ABNORMAL HIGH (ref 70–99)
Glucose-Capillary: 175 mg/dL — ABNORMAL HIGH (ref 70–99)
Glucose-Capillary: 140 mg/dL — ABNORMAL HIGH (ref 70–99)
Glucose-Capillary: 129 mg/dL — ABNORMAL HIGH (ref 70–99)

## 2019-10-08 LAB — CBC
HCT: 44.9 % (ref 39.0–52.0)
Hemoglobin: 15.1 g/dL (ref 13.0–17.0)
MCH: 30.8 pg (ref 26.0–34.0)
MCHC: 33.6 g/dL (ref 30.0–36.0)
MCV: 91.6 fL (ref 80.0–100.0)
Platelets: 99 10*3/uL — ABNORMAL LOW (ref 150–400)
RBC: 4.9 MIL/uL (ref 4.22–5.81)
RDW: 13.2 % (ref 11.5–15.5)
WBC: 23.5 10*3/uL — ABNORMAL HIGH (ref 4.0–10.5)
nRBC: 0 % (ref 0.0–0.2)

## 2019-10-08 LAB — URINE CULTURE: Culture: NO GROWTH

## 2019-10-08 LAB — ECHOCARDIOGRAM COMPLETE
Height: 71 in
Weight: 3072 oz

## 2019-10-08 IMAGING — DX DG WRIST COMPLETE 3+V*R*
3 series · 3 of 3 positions shown · non-contrast
Comparison: None.

CLINICAL DATA: Right wrist pain.

EXAM:
RIGHT WRIST - COMPLETE 3+ VIEW

[wrist ap]
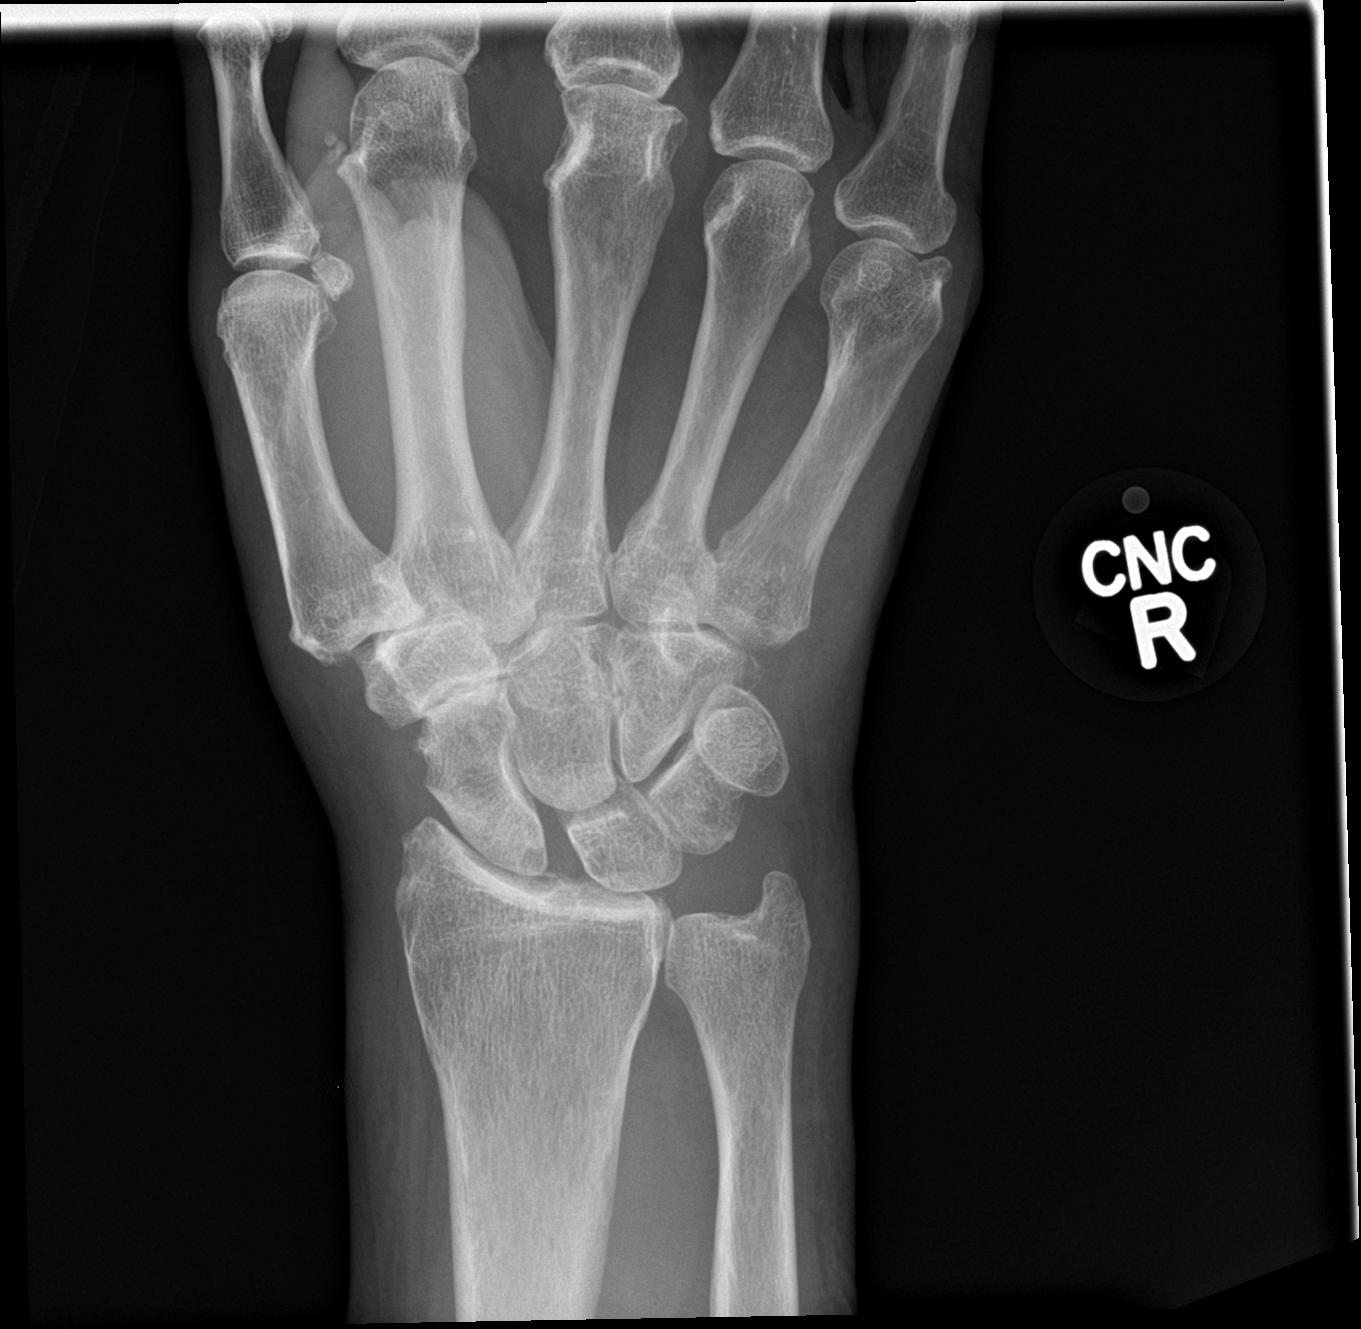

[wrist obl]
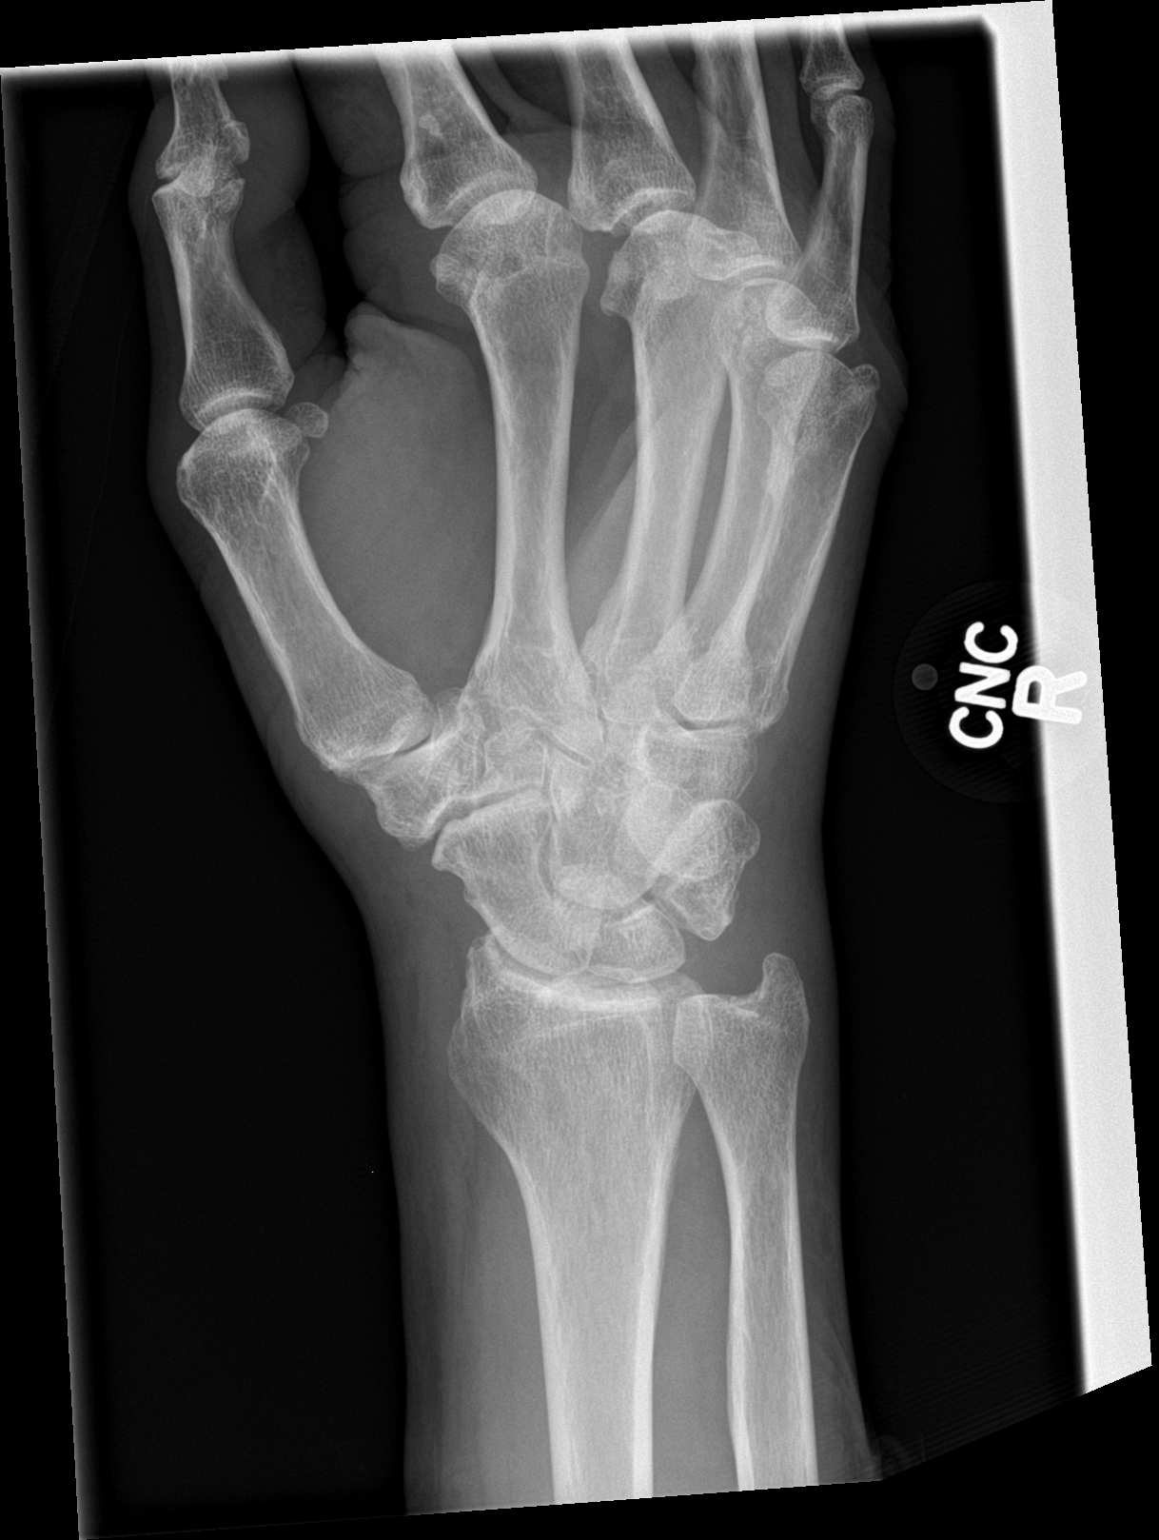

[wrist lat]
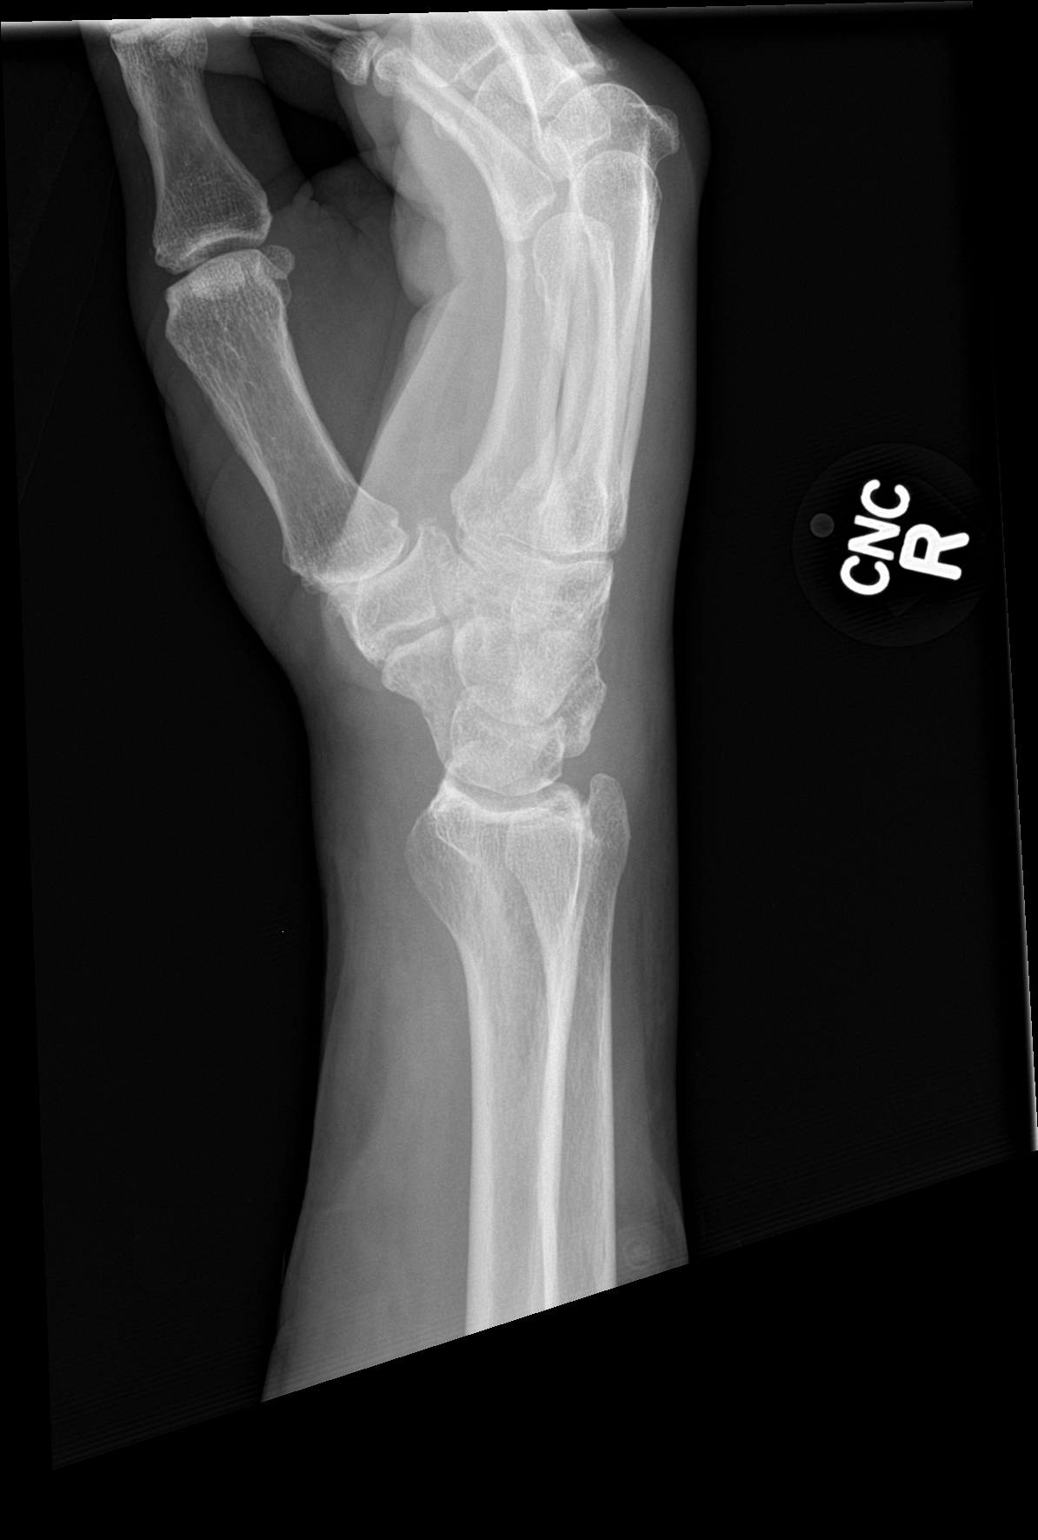

[3 of 3 positions shown; findings below may reference images not displayed]

FINDINGS: There is no acute displaced fracture or dislocation. There are
degenerative changes of the metacarpophalangeal joints. There are
degenerative changes of the radiocarpal joint. There are
degenerative changes of the scaphoid trapezial joint.
IMPRESSION: Negative.

## 2019-10-08 MED ORDER — PENICILLIN G POTASSIUM 20000000 UNITS IJ SOLR
12.0000 10*6.[IU] | Freq: Two times a day (BID) | INTRAVENOUS | Status: AC
  Administered 2019-10-09 – 2019-10-16 (×13): 12 10*6.[IU] via INTRAVENOUS
  Filled 2019-10-08 (×16): qty 12

## 2019-10-08 MED ORDER — SODIUM CHLORIDE 0.9 % IV SOLN: INTRAVENOUS | Status: DC

## 2019-10-08 NOTE — Evaluation (Signed)
Clinical/Bedside Swallow Evaluation Patient Details  Name: Dustin Harrison MRN: [id number] Date of Birth: 03/10/1932  Today's Date: 10/08/2019 Time: SLP Start Time (ACUTE ONLY): 0845 SLP Stop Time (ACUTE ONLY): 0945 SLP Time Calculation (min) (ACUTE ONLY): 60 min  Past Medical History:  Past Medical History:  Diagnosis Date  . Abnormal glucose   . Arthritis   . Baker's cyst of knee    left  . BPH (benign prostatic hypertrophy)   . Cough    because of "tight" esophagus  . Diabetes mellitus   . Glaucoma   . HOH (hard of hearing)   . Hypertension   . Pheochromocytoma of right adrenal gland   . Ulcer   . Wears hearing aid    bilateral   Past Surgical History:  Past Surgical History:  Procedure Laterality Date  . adrenaletomy    . CATARACT EXTRACTION W/PHACO Left 10/08/2015   Procedure: CATARACT EXTRACTION PHACO AND INTRAOCULAR LENS PLACEMENT ([place]) left eye;  Surgeon: Leandrew Koyanagi, MD;  Location: [place];  Service: Ophthalmology;  Laterality: Left;  MALYUGIN  . HERNIA REPAIR    . SQUAMOUS CELL CARCINOMA EXCISION  03-2006   left ear  . TONSILLECTOMY     HPI:  Pt is a 86 y.o. male with medical history significant for hypertension, diabetes mellitus, cochlear implant on L w/ HA on R, and chronic low back pain who presents to the emergency room for evaluation of worsening low back pain and generalized weakness.  Patient also complains of pain in his neck which is new.  Per EMS patient has had 2 falls in the past week  but patient is unable to recall the details of the falls. MRI of the cervical spine showed cervical spondylosis and degenerative disc disease, causing prominent impingement at C3-4 and C4-5; moderate impingement at C2-3; and mild impingement at C5-6 and C7-T1. MRI of the lumbar spine showed lumbar spondylosis and degenerative disc disease, causing moderate impingement at L4-5 and mild impingement at L3-4 and L5-S1.  Of note, per past history, pt has been  told he has a cough "because of tight esophagus". Pt endorsed ongoing swallowing problems "for awhile now" when asked.    Assessment / Plan / Recommendation Clinical Impression  Pt appears to present w/ consistent, overt clinical s/s of aspiration w/ po trials given at BSE. Pt exhibited fairly immediate throat clearing and/or cough, then delayed throat clearing/cough post trials of thin liquids, Nectar liquids, purees and soft solids. Vocal quality noted to be slightly wet PRIOR to po's intermittently during talking w/ pt. Pt's Vocal quality was reduced w/ lower volume; Dysphonia. Slight-min oral motor weakness/tone noted; R sided UE weakness noted as well - pt only able to use his LUE for holding cup to drink. Oral clearing achieved w/ all trials; slight extra time noted w/ increased textured trials. Adequate native Dentition. Pt stated his Dysphonia has been present for the last "week"; the Dysphagia ongoing "for awhile" -- noted in his chart PMH, a reference to "Cough, because of a tight Esophagus". Noted significant cervical spine impingement noted from C2-6, esp. C5-6. Pt denied any f/u w/ Neurology, GI, or surgery re: this. Due to pt's presentation re: swallowing, recommend for NPO status w/ f/u w/ MBSS today. MD and NSG informed. Order for MBSS placed. Recommend frequent oral care for hygiene and stimulation of swallowing. MD updated on concerns of swallowing, RUE weakness, and Dysphonia also.  SLP Visit Diagnosis: Dysphagia, pharyngoesophageal phase (R13.14);Dysphagia, pharyngeal phase (R13.13)(Dysphonia)  Aspiration Risk  Moderate aspiration risk;Severe aspiration risk;Risk for inadequate nutrition/hydration    Diet Recommendation  NPO status w/ frequent oral care for hygiene and stimulation of swallowing   Medication Administration: Whole meds with puree(vs Crushed in Puree IF necessary meds)    Other  Recommendations Recommended Consults: (TBD) Oral Care Recommendations: Oral care  QID;Staff/trained caregiver to provide oral care(d/t NPO ) Other Recommendations: (TBD)   Follow up Recommendations (TBD)      Frequency and Duration min 3x week  2 weeks       Prognosis Prognosis for Safe Diet Advancement: Guarded Barriers to Reach Goals: Time post onset;Severity of deficits      Swallow Study   General Date of Onset: 10/07/19 HPI: Pt is a 86 y.o. male with medical history significant for hypertension, diabetes mellitus, cochlear implant on L w/ HA on R, and chronic low back pain who presents to the emergency room for evaluation of worsening low back pain and generalized weakness.  Patient also complains of pain in his neck which is new.  Per EMS patient has had 2 falls in the past week  but patient is unable to recall the details of the falls. MRI of the cervical spine showed cervical spondylosis and degenerative disc disease, causing prominent impingement at C3-4 and C4-5; moderate impingement at C2-3; and mild impingement at C5-6 and C7-T1. MRI of the lumbar spine showed lumbar spondylosis and degenerative disc disease, causing moderate impingement at L4-5 and mild impingement at L3-4 and L5-S1.  Of note, per past history, pt has been told he has a cough "because of tight esophagus". Pt endorsed ongoing swallowing problems "for awhile now" when asked.  Type of Study: Bedside Swallow Evaluation Previous Swallow Assessment: none Diet Prior to this Study: Regular;Thin liquids Temperature Spikes Noted: No(wbc elevated at admit; now 23.5) Respiratory Status: Nasal cannula(2L) History of Recent Intubation: No Behavior/Cognition: Alert;Cooperative;Pleasant mood(Dysphonia) Oral Cavity Assessment: Within Functional Limits Oral Care Completed by SLP: Yes Oral Cavity - Dentition: Adequate natural dentition Vision: Functional for self-feeding Self-Feeding Abilities: Able to feed self;Needs assist;Needs set up(using LUE) Patient Positioning: Upright in bed(needed full  positioning support) Baseline Vocal Quality: (Dysphonia) Volitional Cough: Weak Volitional Swallow: Able to elicit    Oral/Motor/Sensory Function Overall Oral Motor/Sensory Function: Mild impairment Facial ROM: Within Functional Limits(grossly ) Facial Symmetry: Abnormal symmetry right(slight) Lingual ROM: Within Functional Limits(grossly) Lingual Symmetry: Within Functional Limits(grossly) Lingual Strength: Reduced(min) Mandible: Within Functional Limits   Ice Chips Ice chips: Within functional limits Presentation: Spoon(fed; 3 trials)   Thin Liquid Thin Liquid: Impaired Presentation: Cup;Self Fed;Straw(3 trials; 1 via straw) Oral Phase Impairments: (none) Oral Phase Functional Implications: (none) Pharyngeal  Phase Impairments: Cough - Immediate;Cough - Delayed;Throat Clearing - Delayed;Throat Clearing - Immediate Other Comments: consistent     Nectar Thick Nectar Thick Liquid: Impaired Presentation: Cup;Self Fed;Straw(3 trials) Oral Phase Impairments: (none) Oral phase functional implications: (none) Pharyngeal Phase Impairments: Cough - Delayed;Cough - Immediate;Throat Clearing - Delayed;Throat Clearing - Immediate Other Comments: consistent   Honey Thick Honey Thick Liquid: Not tested   Puree Puree: Impaired Presentation: Spoon(fed; 2 trials) Oral Phase Impairments: (none) Oral Phase Functional Implications: (none) Pharyngeal Phase Impairments: Throat Clearing - Delayed;Throat Clearing - Immediate Other Comments: consistent   Solid     Solid: Impaired Presentation: Spoon(fed; 1 trial) Oral Phase Impairments: (wfl) Oral Phase Functional Implications: Impaired mastication(slight) Pharyngeal Phase Impairments: Throat Clearing - Delayed       Orinda Kenner, MS, CCC-SLP Amarys Sliwinski 10/08/2019,3:01 PM

## 2019-10-08 NOTE — Progress Notes (Signed)
PROGRESS NOTE    Dustin Harrison  [id number] DOB: 09-30-1931 DOA: 10/07/2019 PCP: Jinny Sanders, MD    Brief Narrative:  Dustin Harrison is a 86 y.o. male with medical history significant for hypertension, diabetes mellitus and chronic low back pain who presents to the emergency room for evaluation of worsening low back pain and generalized weakness.      Consultants:     Procedures: MRI, CT  Antimicrobials:   Pen G   Subjective:  overnight had choking on thin liquids.  Switch follow up at bedside.  Objective: Vitals:   10/07/19 2235 10/08/19 0501 10/08/19 0618 10/08/19 0624  BP: 126/74 (!) 169/104 (!) 156/103 (!) 150/91  Pulse: 93 99 90   Resp: 18 20 20    Temp:  97.9 F (36.6 C) 98.4 F (36.9 C)   TempSrc:  Oral Oral   SpO2:  92% (!) 89%   Weight:      Height:        Intake/Output Summary (Last 24 hours) at 10/08/2019 0823 Last data filed at 10/08/2019 0100 Gross per 24 hour  Intake 182.68 ml  Output 450 ml  Net -267.32 ml   Filed Weights   10/07/19 0338  Weight: 87.1 kg    Examination:  General exam: Appears calm and comfortable  Respiratory system: Clear to auscultation. Respiratory effort normal.no wheezing Cardiovascular system: S1 & S2 heard, RRR. No JVD, murmurs, rubs, gallops or clicks.  Gastrointestinal system: Abdomen is nondistended, soft and nontender. Normal bowel sounds heard. Central nervous system: Alert and oriented. No focal neurological deficits. Extremities: no edema. Skin: warm, dry Psychiatry: Judgement and insight appear normal. Mood & affect appropriate.     Data Reviewed: I have personally reviewed following labs and imaging studies  CBC: Recent Labs  Lab 10/07/19 0727 10/08/19 0511  WBC 28.1* 23.5*  NEUTROABS 24.3*  --   HGB 14.4 15.1  HCT 43.4 44.9  MCV 93.3 91.6  PLT 121* 99*   Basic Metabolic Panel: Recent Labs  Lab 10/07/19 0727 10/08/19 0511  NA 134* 135  K 5.0 4.2  CL 102 104  CO2 23 23    GLUCOSE 140* 148*  BUN 66* 48*  CREATININE 2.44* 1.61*  CALCIUM 8.6* 8.3*   GFR: Estimated Creatinine Clearance: 35.7 mL/min (A) (by C-G formula based on SCr of 1.61 mg/dL (H)). Liver Function Tests: No results for input(s): AST, ALT, ALKPHOS, BILITOT, PROT, ALBUMIN in the last 168 hours. No results for input(s): LIPASE, AMYLASE in the last 168 hours. No results for input(s): AMMONIA in the last 168 hours. Coagulation Profile: No results for input(s): INR, PROTIME in the last 168 hours. Cardiac Enzymes: Recent Labs  Lab 10/07/19 0727  CKTOTAL 305   BNP (last 3 results) No results for input(s): PROBNP in the last 8760 hours. HbA1C: Recent Labs    10/07/19 0727  HGBA1C 6.8*   CBG: Recent Labs  Lab 10/07/19 1113 10/07/19 1635 10/07/19 1942 10/07/19 2017 10/08/19 0746  GLUCAP 111* 231* 167* 146* 164*   Lipid Profile: No results for input(s): CHOL, HDL, LDLCALC, TRIG, CHOLHDL, LDLDIRECT in the last 72 hours. Thyroid Function Tests: No results for input(s): TSH, T4TOTAL, FREET4, T3FREE, THYROIDAB in the last 72 hours. Anemia Panel: No results for input(s): VITAMINB12, FOLATE, FERRITIN, TIBC, IRON, RETICCTPCT in the last 72 hours. Sepsis Labs: Recent Labs  Lab 10/07/19 0831  LATICACIDVEN 1.7    Recent Results (from the past 240 hour(s))  Blood Culture (routine x 2)  Status: None (Preliminary result)   Collection Time: 10/07/19  8:31 AM   Specimen: BLOOD  Result Value Ref Range Status   Specimen Description BLOOD LAC  Final   Special Requests   Final    BOTTLES DRAWN AEROBIC AND ANAEROBIC Blood Culture adequate volume   Culture  Setup Time   Final    GRAM POSITIVE COCCI IN BOTH AEROBIC AND ANAEROBIC BOTTLES CRITICAL RESULT CALLED TO, READ BACK BY AND VERIFIED WITH: ASAJAH DUNCAN ON 10/07/2019 AT 2035 TIK Performed at [facility], [address] [place], [place] [address]    Culture [facility] POSITIVE COCCI  Final   Report Status PENDING  Incomplete   Blood Culture (routine x 2)     Status: Abnormal (Preliminary result)   Collection Time: 10/07/19  8:31 AM   Specimen: BLOOD  Result Value Ref Range Status   Specimen Description   Final    BLOOD LWRIST Performed at [facility], [address], [place], [place] [address]    Special Requests   Final    BOTTLES DRAWN AEROBIC AND ANAEROBIC Blood Culture adequate volume Performed at [facility], [address] [place], [place] [address]    Culture  Setup Time   Final    GRAM POSITIVE COCCI IN BOTH AEROBIC AND ANAEROBIC BOTTLES CRITICAL RESULT CALLED TO, READ BACK BY AND VERIFIED WITH: ASAJAH DUNCAN ON 10/07/2019 AT 2035 TIK Performed at [facility], [place] [address], [place], [place] [address]    Culture GROUP B STREP(S.AGALACTIAE)ISOLATED (A)  Final   Report Status PENDING  Incomplete  Blood Culture ID Panel (Reflexed)     Status: Abnormal   Collection Time: 10/07/19  8:31 AM  Result Value Ref Range Status   Enterococcus species NOT DETECTED NOT DETECTED Final   Listeria monocytogenes NOT DETECTED NOT DETECTED Final   Staphylococcus species NOT DETECTED NOT DETECTED Final   Staphylococcus aureus (BCID) NOT DETECTED NOT DETECTED Final   Streptococcus species DETECTED (A) NOT DETECTED Final    Comment: CRITICAL RESULT CALLED TO, READ BACK BY AND VERIFIED WITH: ASAJAH DUNCAN ON 10/07/2019 AT 2035 TIK    Streptococcus agalactiae DETECTED (A) NOT DETECTED Final    Comment: CRITICAL RESULT CALLED TO, READ BACK BY AND VERIFIED WITH: ASAJAH DUNCAN ON 10/07/2019 AT 2035 TIK    Streptococcus pneumoniae NOT DETECTED NOT DETECTED Final   Streptococcus pyogenes NOT DETECTED NOT DETECTED Final   Acinetobacter baumannii NOT DETECTED NOT DETECTED Final   Enterobacteriaceae species NOT DETECTED NOT DETECTED Final   Enterobacter cloacae complex NOT DETECTED NOT DETECTED Final   Escherichia coli NOT DETECTED NOT DETECTED Final   Klebsiella oxytoca NOT DETECTED NOT  DETECTED Final   Klebsiella pneumoniae NOT DETECTED NOT DETECTED Final   Proteus species NOT DETECTED NOT DETECTED Final   Serratia marcescens NOT DETECTED NOT DETECTED Final   Haemophilus influenzae NOT DETECTED NOT DETECTED Final   Neisseria meningitidis NOT DETECTED NOT DETECTED Final   Pseudomonas aeruginosa NOT DETECTED NOT DETECTED Final   Candida albicans NOT DETECTED NOT DETECTED Final   Candida glabrata NOT DETECTED NOT DETECTED Final   Candida krusei NOT DETECTED NOT DETECTED Final   Candida parapsilosis NOT DETECTED NOT DETECTED Final   Candida tropicalis NOT DETECTED NOT DETECTED Final    Comment: Performed at [facility], [address] [place], [place] [address]  Respiratory Panel by RT PCR (Flu A&B, Covid) - Nasopharyngeal Swab     Status: None   Collection Time:  10/07/19 12:27 PM   Specimen: Nasopharyngeal Swab  Result Value Ref Range Status   SARS Coronavirus 2 by RT PCR NEGATIVE NEGATIVE Final    Comment: (NOTE) SARS-CoV-2 target nucleic acids are NOT DETECTED. The SARS-CoV-2 RNA is generally detectable in upper respiratoy specimens during the acute phase of infection. The lowest concentration of SARS-CoV-2 viral copies this assay can detect is 131 copies/mL. A negative result does not preclude SARS-Cov-2 infection and should not be used as the sole basis for treatment or other patient management decisions. A negative result may occur with  improper specimen collection/handling, submission of specimen other than nasopharyngeal swab, presence of viral mutation(s) within the areas targeted by this assay, and inadequate number of viral copies (<131 copies/mL). A negative result must be combined with clinical observations, patient history, and epidemiological information. The expected result is Negative. Fact Sheet for Patients:  PinkCheek.be Fact Sheet for Healthcare Providers:  GravelBags.it This  test is not yet ap proved or cleared by the [place] FDA and  has been authorized for detection and/or diagnosis of SARS-CoV-2 by FDA under an Emergency Use Authorization (EUA). This EUA will remain  in effect (meaning this test can be used) for the duration of the COVID-19 declaration under Section 564(b)(1) of the Act, 21 U.S.C. section 360bbb-3(b)(1), unless the authorization is terminated or revoked sooner.    Influenza A by PCR NEGATIVE NEGATIVE Final   Influenza B by PCR NEGATIVE NEGATIVE Final    Comment: (NOTE) The Xpert Xpress SARS-CoV-2/FLU/RSV assay is intended as an aid in  the diagnosis of influenza from Nasopharyngeal swab specimens and  should not be used as a sole basis for treatment. Nasal washings and  aspirates are unacceptable for Xpert Xpress SARS-CoV-2/FLU/RSV  testing. Fact Sheet for Patients: PinkCheek.be Fact Sheet for Healthcare Providers: GravelBags.it This test is not yet approved or cleared by the [place] FDA and  has been authorized for detection and/or diagnosis of SARS-CoV-2 by  FDA under an Emergency Use Authorization (EUA). This EUA will remain  in effect (meaning this test can be used) for the duration of the  Covid-19 declaration under Section 564(b)(1) of the Act, 21  U.S.C. section 360bbb-3(b)(1), unless the authorization is  terminated or revoked. Performed at [facility], [address] [place], [place] [address]          Radiology Studies: DG Chest 2 View  Result Date: 10/07/2019 CLINICAL DATA:  Weakness over the past couple days with chronic low back pain. 2 falls over the past week. EXAM: CHEST - 2 VIEW COMPARISON:  None. FINDINGS: Lungs are adequately inflated without focal airspace consolidation or effusion. Calcified right suprahilar lymph nodes. Cardiomediastinal silhouette is unremarkable. Suggestion of an old right clavicle fracture. No acute fracture.  Degenerative change of the spine. IMPRESSION: No acute findings. Electronically Signed   By: Marin Olp M.D.   On: 10/07/2019 07:53   DG Lumbar Spine 2-3 Views  Result Date: 10/07/2019 CLINICAL DATA:  Weakness. Low back pain. Two falls over the past week. EXAM: LUMBAR SPINE - 2-3 VIEW COMPARISON:  03/15/2017 FINDINGS: Vertebral body heights are maintained. There is moderate spondylosis of the lumbar spine to include facet arthropathy. Minimal disc space narrowing throughout the lumbar spine with some sparing of the L5-S1 level. No compression fracture. Subtle grade 1 anterolisthesis of L4 on L5 unchanged due to facet arthropathy. IMPRESSION: 1.  No acute findings. 2. Moderate spondylosis of the lumbar spine with mild multilevel disc disease. Subtle stable grade  1 anterolisthesis of L4 on L5 due to facet arthropathy. Electronically Signed   By: Marin Olp M.D.   On: 10/07/2019 07:56   CT Head Wo Contrast  Result Date: 10/07/2019 CLINICAL DATA:  Multiple falls EXAM: CT HEAD WITHOUT CONTRAST TECHNIQUE: Contiguous axial images were obtained from the base of the skull through the vertex without intravenous contrast. COMPARISON:  None. FINDINGS: There is streak artifact at the level of left cochlear implant apparatus. Below findings are within this limitation. Brain: There is no acute intracranial hemorrhage, mass effect, or edema. Gray-white differentiation is preserved. There is no extra-axial fluid collection. Prominence of the ventricles and sulci reflects mild generalized parenchymal volume loss. Patchy hypoattenuation in the supratentorial white matter is nonspecific may reflect mild chronic microvascular ischemic changes. Vascular: There is atherosclerotic calcification at the skull base. Skull: Calvarium is unremarkable apart from below. Sinuses/Orbits: Mild mucosal thickening. No significant orbital finding. Other: Left canal wall up mastoidectomy with cochlear implant. IMPRESSION: No evidence of  acute intracranial injury. Electronically Signed   By: Macy Mis M.D.   On: 10/07/2019 07:57   CT Cervical Spine Wo Contrast  Result Date: 10/07/2019 CLINICAL DATA:  Multiple falls EXAM: CT CERVICAL SPINE WITHOUT CONTRAST TECHNIQUE: Multidetector CT imaging of the cervical spine was performed without intravenous contrast. Multiplanar CT image reconstructions were also generated. COMPARISON:  None. FINDINGS: Alignment: Accentuation of cervical lordosis is likely related to positioning. Mild anterolisthesis at C5-C6. Skull base and vertebrae: There is no acute cervical spine fracture. No destructive osseous lesion. Soft tissues and spinal canal: No prevertebral fluid or swelling. No visible canal hematoma. Disc levels: Multilevel degenerative changes are present including disc space narrowing, endplate osteophytes, and facet and uncovertebral hypertrophy. There is no high-grade osseous encroachment on the spinal canal. Multilevel significant neural foraminal stenosis is present. Upper chest: No apical lung mass. Healed right clavicle fracture partially imaged. Other: None. IMPRESSION: No acute cervical spine fracture. Electronically Signed   By: Macy Mis M.D.   On: 10/07/2019 08:01   MR Cervical Spine Wo Contrast  Result Date: 10/07/2019 CLINICAL DATA:  Hypertension, diabetes, back pain with generalized weakness. EXAM: MRI CERVICAL SPINE WITHOUT CONTRAST TECHNIQUE: Multiplanar, multisequence MR imaging of the cervical spine was performed. No intravenous contrast was administered. COMPARISON:  CT scan dated 10/07/2019 FINDINGS: Alignment: Mildly exaggerated upper cervical lordosis. 2 mm degenerative anterolisthesis at C6-7 and C7-T1. Vertebrae: No significant vertebral marrow edema is identified. Interbody fusion at C5-6. Small erosion along the posterior odontoid on image 6/1 eccentric to the left. Disc desiccation throughout cervical spine. Cord: No significant abnormal spinal cord signal is  observed. Posterior Fossa, vertebral arteries, paraspinal tissues: Unremarkable Disc levels: C2-3: Moderate right and mild left foraminal stenosis due to facet and uncinate spurring. Mild disc bulge. C3-4: Prominent left and moderate right foraminal stenosis and borderline central narrowing of the thecal sac due to facet and uncinate spurring along with disc bulge. C4-5: Prominent bilateral foraminal stenosis mild central narrowing of the thecal sac due to facet and uncinate spurring with disc bulge. Possible small left paracentral disc protrusion. C5-6: Mild bilateral foraminal stenosis due to facet and uncinate spurring. C6-7: No impingement. Right paracentral disc protrusion and disc uncovering. C7-T1: Mild bilateral foraminal stenosis due to uncinate and facet spurring. T1-2: Left perineural cysts noted common no overt impingement. IMPRESSION: 1. Cervical spondylosis and degenerative disc disease, causing prominent impingement at C3-4 and C4-5; moderate impingement at C2-3; and mild impingement at C5-6 and C7-T1, as detailed above. Electronically  Signed   By: Van Clines M.D.   On: 10/07/2019 10:00   MR THORACIC SPINE WO CONTRAST  Result Date: 10/07/2019 CLINICAL DATA:  Back pain in weakness. EXAM: MRI THORACIC SPINE WITHOUT CONTRAST TECHNIQUE: Multiplanar, multisequence MR imaging of the thoracic spine was performed. No intravenous contrast was administered. COMPARISON:  Chest radiograph 10/07/2019 FINDINGS: Alignment:  2 mm degenerative anterolisthesis at T1-2. Vertebrae: Small hemangiomas in the T3 and T6 vertebra. Multilevel Schmorl's nodes. Mild degenerative endplate findings at [id number] and T11-12. Disc desiccation throughout the thoracic spine with loss of disc height most notable at T7-8, T9-10, T10-11, T11-12. No significant vertebral marrow edema is identified. Cord:  Unremarkable Paraspinal and other soft tissues: Potential mild interstitial accentuation in the lungs although not as readily  apparent on the dedicated chest radiograph. Disc levels: Mild disc bulges at T8-9, T9-10, T11-12 along with disc bulge and a shallow right paracentral disc protrusion at T3-4, but no observed impingement. Left foraminal perineural cyst at T1-2. IMPRESSION: 1. Thoracic spondylosis and degenerative disc disease without observed impingement. 2. Equivocal mild interstitial accentuation in the lungs although not as readily apparent on the dedicated chest radio Electronically Signed   By: Van Clines M.D.   On: 10/07/2019 10:06   MR LUMBAR SPINE WO CONTRAST  Result Date: 10/07/2019 CLINICAL DATA:  Weakness and back pain EXAM: MRI LUMBAR SPINE WITHOUT CONTRAST TECHNIQUE: Multiplanar, multisequence MR imaging of the lumbar spine was performed. No intravenous contrast was administered. COMPARISON:  Lumbar radiographs 10/07/2019 FINDINGS: Despite efforts by the technologist and patient, motion artifact is present on today's exam and could not be eliminated. This reduces exam sensitivity and specificity. Segmentation: The lowest lumbar type non-rib-bearing vertebra is labeled as L5. Alignment:  4 mm degenerative anterolisthesis at L4-5. Vertebrae: Disc desiccation at all levels between L1 and L5 with loss of disc height at L3-4 and L4-5. Type 1 degenerative endplate findings at [id number] with a Schmorl's node along the superior endplate of L4. No lumbar spine fracture or acute bony findings. Conus medullaris and cauda equina: Conus extends to the T12 level. Conus and cauda equina appear normal. Paraspinal and other soft tissues: Cortical thinning in the right kidney lower pole favoring right renal atrophy although only a portion of the right kidney is included. Disc levels: T12-L1: Unremarkable. L1-2: No impingement. Diffuse disc bulge. L2-3: No impingement. Diffuse disc bulge. L3-4: Mild right foraminal stenosis with mild displacement of the right L3 nerve the right lateral extraforaminal space borderline right  subarticular lateral recess stenosis as well as borderline central narrowing of the thecal sac due to disc bulge, intervertebral spurring, and facet arthropathy. L4-5: Moderate central narrowing of the thecal sac with mild to moderate right borderline left foraminal stenosis as well as mild bilateral subarticular lateral recess stenosis due to disc uncovering, disc bulge, intervertebral spurring, facet arthropathy. Small left facet joint effusion. L5-S1: Mild left foraminal stenosis with mild left and borderline right subarticular lateral recess stenosis due to disc bulge facet arthropathy. Small left facet joint effusion. IMPRESSION: 1. Lumbar spondylosis and degenerative disc disease, causing moderate impingement at L4-5 and mild impingement at L3-4 and L5-S1. 2. Cortical thinning of the right kidney lower pole suggesting right renal atrophy. Electronically Signed   By: Van Clines M.D.   On: 10/07/2019 09:51        Scheduled Meds: . amLODipine  10 mg Oral Daily  . cyclobenzaprine  5 mg Oral TID  . finasteride  5 mg Oral Daily  . heparin  5,000 Units Subcutaneous Q8H  . insulin aspart  0-15 Units Subcutaneous TID WC  . insulin aspart  4 Units Subcutaneous TID WC  . lidocaine  1 patch Transdermal Q24H  . psyllium  1 packet Oral Daily  . tamsulosin  0.8 mg Oral Daily  . timolol  1 drop Both Eyes BID   Continuous Infusions: . sodium chloride 100 mL/hr at 10/08/19 0046  . pencillin G potassium IV 4 Million Units (10/08/19 0516)    Assessment & Plan:   Principal Problem:   AKI (acute kidney injury) ([place]) Active Problems:   Diabetes mellitus without complication (HCC)   Essential hypertension, benign   CKD (chronic kidney disease) stage 2, GFR 60-89 ml/min   Back pain   Leukocytosis  Acute kidney injury Superimposed on underlying chronic kidney disease stage II At baseline patient has a serum creatinine of 1.4 and  on admission it is 2.4 Acute kidney injury may be secondary  to NSAID ? V.s. dehydration v.s. combo CK negative thus ruled out rhabdomyolysis Renal function improving with ivf Hold losartan Continue monitoring  Bacteremia- bcx + for strep Agalac.  Started on PcN G , will continue Ck echo Consult ID  Diabetes mellitus With complications of stage II chronic kidney disease Maintain consistent carbohydrate diet Sliding scale coverage with NovoLog   Hypertension Uncontrolled and due to pain Start patient on amlodipine 10 mg daily since losartan is on hold for acute kidney injury Place patient on Labetalol 10mg  IV q 6 PRN SBP > 153mmHg    Acute on chronic low back pain Imaging not suggestive of any acute pathology at this time Patient noted to have lumbar spondylosis and degenerative disc disease. Pain control Physical therapy Fall precautions   Leukocytosis 2/2 bacteremia , see above Improving.  DVT prophylaxis: scd Code Status:full Family Communication: none at bedside Disposition Plan: back to previous home environment Barrier: Patient with bacteremia, needs IV abx, w/u for bacteremia, likely be here 2 more days        LOS: 1 day   Time spent:45 min with >50% on coc    Nolberto Hanlon, MD [facility] Pager [phone]  If 7PM-7AM, please contact night-coverage [email or web address] Password [facility] 10/08/2019, 8:23 AM

## 2019-10-08 NOTE — Consult Note (Addendum)
NAME: Dustin Harrison  DOB: 1932-04-30  MRN: [id number]  Date/Time: 10/08/2019 1:03 PM  REQUESTING PROVIDER: Dr. Kurtis Bushman Subjective:  REASON FOR CONSULT: Group B streptococcus bacteremia History is fuzzy from the patient and his wife.  Chart reviewed.  ? Dustin Harrison is a 86 y.o. male with history of right adrenal tumor, pheochromocytoma, status post right adrenalectomy in 2006, cochlear implant on the left side in January 2020, diabetes not on any medications but diet controlled was brought in by EMS from [facility] with neck and back pain. As per his wife and patient on Friday morning he woke up with severe chills and was shaking for quite some time.  Apparently he took some ibuprofen.  The next day he had fallen at home.  After some time he rolled over and got up.  Since then his neck and back started to hurt. He had underlying low back pain but this pain was worse after the fall. Apparently on Sunday early morning he was having severe pain in his neck and hands called EMS and was brought to the hospital. As per his daughter who lives in [place] and is now here she says patient has been doing fine before this happened.  He has a cane and walks every day. Patient does say that he has some difficulty in swallowing and could cough and choke. In the ED blood pressure was 126/67, heart rate of 72, respiratory rate of 18, temperature of 98.2, and oxygen of 97%. Labs revealed a hemoglobin of 14.4, WBC of 28.1 and platelet of 121.  Blood glucose was 148 and creatinine was 2.4 for the baseline being 1.40.  Lactate was 1.7. Blood cultures were sent and and gram-positive cocci turning out to be group B streptococcus in 4 out of 4 bottles. I am asked to see the patient for the same. Patient is currently on penicillin G. MRI of the lumbar spine showed lumbar spondylosis and degenerative disc disease.  Causing moderate impingement at L4-L5 and mild impingement at L3-L4 L5-S1.  MRI of the cervical spine  show degenerative changes at C3-C4 C4-C5 and C5-C6 and C7-T1. The MRI was done without contrast and did not show any discitis or Paraspinal abscess. He had an episode of vomiting. Patient does not have any abdominal pain, dysuria, rash, diarrhea. Past Medical History:  Diagnosis Date  . Abnormal glucose   . Arthritis   . Baker's cyst of knee    left  . BPH (benign prostatic hypertrophy)   . Cough    because of "tight" esophagus  . Diabetes mellitus   . Glaucoma   . HOH (hard of hearing)   . Hypertension   . Pheochromocytoma of right adrenal gland   . Ulcer   . Wears hearing aid    bilateral    Past Surgical History:  Procedure Laterality Date  . adrenaletomy    . CATARACT EXTRACTION W/PHACO Left 10/08/2015   Procedure: CATARACT EXTRACTION PHACO AND INTRAOCULAR LENS PLACEMENT ([place]) left eye;  Surgeon: Leandrew Koyanagi, MD;  Location: [place];  Service: Ophthalmology;  Laterality: Left;  MALYUGIN  . HERNIA REPAIR    . SQUAMOUS CELL CARCINOMA EXCISION  03-2006   left ear  . TONSILLECTOMY      Social History   Socioeconomic History  . Marital status: Married    Spouse name: Not on file  . Number of children: Not on file  . Years of education: Not on file  . Highest education level: Not on  file  Occupational History  . Occupation: chaplain  Tobacco Use  . Smoking status: Never Smoker  . Smokeless tobacco: Never Used  Substance and Sexual Activity  . Alcohol use: Yes    Alcohol/week: 7.0 standard drinks    Types: 7 Glasses of wine per week  . Drug use: No  . Sexual activity: Not Currently  Other Topics Concern  . Not on file  Social History Narrative   Regular exercise- yes- 3-4 times a week treadmill/walk   Diet: fruits and veggies,water   Living will, HCPOA (wife) , full code (reviewed 28)   Social Determinants of Health   Financial Resource Strain:   . Difficulty of Paying Living Expenses:   Food Insecurity:   . Worried About Sales executive in the Last Year:   . Arboriculturist in the Last Year:   Transportation Needs:   . Film/video editor (Medical):   Marland Kitchen Lack of Transportation (Non-Medical):   Physical Activity:   . Days of Exercise per Week:   . Minutes of Exercise per Session:   Stress:   . Feeling of Stress :   Social Connections:   . Frequency of Communication with Friends and Family:   . Frequency of Social Gatherings with Friends and Family:   . Attends Religious Services:   . Active Member of Clubs or Organizations:   . Attends Archivist Meetings:   Marland Kitchen Marital Status:   Intimate Partner Violence:   . Fear of Current or Ex-Partner:   . Emotionally Abused:   Marland Kitchen Physically Abused:   . Sexually Abused:     Family History  Problem Relation Age of Onset  . Alcohol abuse Mother   . Coronary artery disease Mother   . Brain cancer Father        tumor  . Diabetes Brother    No Known Allergies  ? Current Facility-Administered Medications  Medication Dose Route Frequency Provider Last Rate Last Admin  . 0.9 %  sodium chloride infusion   Intravenous Continuous Agbata, Tochukwu, MD 100 mL/hr at 10/08/19 0046 New Bag at 10/08/19 0046  . amLODipine (NORVASC) tablet 10 mg  10 mg Oral Daily Agbata, Tochukwu, MD   10 mg at 10/08/19 1021  . cyclobenzaprine (FLEXERIL) tablet 5 mg  5 mg Oral TID Agbata, Tochukwu, MD   5 mg at 10/08/19 1021  . finasteride (PROSCAR) tablet 5 mg  5 mg Oral Daily Agbata, Tochukwu, MD   5 mg at 10/08/19 1020  . HYDROcodone-acetaminophen (NORCO/VICODIN) 5-325 MG per tablet 1 tablet  1 tablet Oral Q6H PRN Agbata, Tochukwu, MD   1 tablet at 10/08/19 1028  . insulin aspart (novoLOG) injection 0-15 Units  0-15 Units Subcutaneous TID WC Agbata, Tochukwu, MD   3 Units at 10/08/19 1222  . insulin aspart (novoLOG) injection 4 Units  4 Units Subcutaneous TID WC Agbata, Tochukwu, MD   4 Units at 10/08/19 1221  . labetalol (NORMODYNE) injection 10 mg  10 mg Intravenous Q6H PRN Agbata,  Tochukwu, MD   10 mg at 10/08/19 0533  . lidocaine (LIDODERM) 5 % 1 patch  1 patch Transdermal Q24H Agbata, Tochukwu, MD      . morphine 2 MG/ML injection 2 mg  2 mg Intravenous Q4H PRN Lang Snow, NP   2 mg at 10/08/19 0531  . ondansetron (ZOFRAN) tablet 4 mg  4 mg Oral Q6H PRN Agbata, Tochukwu, MD       Or  .  ondansetron (ZOFRAN) injection 4 mg  4 mg Intravenous Q6H PRN Agbata, Tochukwu, MD      . penicillin G potassium 4 Million Units in dextrose 5 % 250 mL IVPB  4 Million Units Intravenous Q4H Agbata, Tochukwu, MD 250 mL/hr at 10/08/19 1028 4 Million Units at 10/08/19 1028  . psyllium (HYDROCIL/METAMUCIL) packet 1 packet  1 packet Oral Daily Agbata, Tochukwu, MD   Stopped at 10/08/19 1030  . tamsulosin (FLOMAX) capsule 0.8 mg  0.8 mg Oral Daily Agbata, Tochukwu, MD   0.8 mg at 10/08/19 1020  . timolol (TIMOPTIC) 0.5 % ophthalmic solution 1 drop  1 drop Both Eyes BID Agbata, Tochukwu, MD         Abtx:  Anti-infectives (From admission, onward)   Start     Dose/Rate Route Frequency Ordered Stop   10/07/19 2200  penicillin G potassium 4 Million Units in dextrose 5 % 250 mL IVPB     4 Million Units 250 mL/hr over 60 Minutes Intravenous Every 4 hours 10/07/19 2001        REVIEW OF SYSTEMS:  Const:  chills, negative weight loss Eyes: negative diplopia or visual changes, negative eye pain ENT: negative coryza, minimal  sore throat Resp: negative cough, hemoptysis, has dyspnea Cards: negative for chest pain, palpitations, lower extremity edema GU: negative for frequency, dysuria and hematuria GI: Negative for abdominal pain, diarrhea, bleeding, constipation Skin: negative for rash and pruritus Heme: negative for easy bruising and gum/nose bleeding MS: Neck pain, low back pain, right hand and wrist pain Neurolo: Complains of weakness right arm Psych: negative for feelings of anxiety, depression  Endocrine: As above Allergy/Immunology- negative for any medication or food  allergies ? Objective:  VITALS:  BP (!) 145/96 (BP Location: Right Arm)   Pulse (!) 108   Temp 97.9 F (36.6 C) (Oral)   Resp 18   Ht 5\' 11"  (1.803 m)   Wt 87.1 kg   SpO2 93%   BMI 26.78 kg/m  PHYSICAL EXAM:  General: Awake, little lethargic participating in PT Head: Normocephalic, without obvious abnormality, atraumatic. Eyes: Conjunctivae clear, anicteric sclerae. Pupils are equal ENT Nares normal. No drainage or sinus tenderness. Lips, mucosa, and tongue normal. No Thrush Neck: Did not check for stiffness  Back: No CVA tenderness. Lungs: Bilateral air entry Heart: S1-S2 MDM General abdomen: Soft, non-tender,not distended. Bowel sounds normal. No masses Extremities: atraumatic, no cyanosis. No edema. No clubbing Skin: No rashes or lesions. Or bruising Lymph: Cervical, supraclavicular normal. Neurologic: Right-sided weakness.  Unable to see whether it is neurological or due to pain. He has dysphonia Pertinent Labs Lab Results CBC    Component Value Date/Time   WBC 23.5 (H) 10/08/2019 0511   RBC 4.90 10/08/2019 0511   HGB 15.1 10/08/2019 0511   HCT 44.9 10/08/2019 0511   PLT 99 (L) 10/08/2019 0511   MCV 91.6 10/08/2019 0511   MCH 30.8 10/08/2019 0511   MCHC 33.6 10/08/2019 0511   RDW 13.2 10/08/2019 0511   LYMPHSABS 0.3 (L) 10/07/2019 0727   MONOABS 0.6 10/07/2019 0727   EOSABS 0.0 10/07/2019 0727   BASOSABS 0.0 10/07/2019 0727    CMP Latest Ref Rng & Units 10/08/2019 10/07/2019 05/18/2019  Glucose 70 - 99 mg/dL 148(H) 140(H) 118(H)  BUN 8 - 23 mg/dL 48(H) 66(H) 23  Creatinine 0.61 - 1.24 mg/dL 1.61(H) 2.44(H) 1.40  Sodium 135 - 145 mmol/L 135 134(L) 139  Potassium 3.5 - 5.1 mmol/L 4.2 5.0 4.3  Chloride 98 - 111  mmol/L 104 102 103  CO2 22 - 32 mmol/L 23 23 30   Calcium 8.9 - 10.3 mg/dL 8.3(L) 8.6(L) 9.0  Total Protein 6.0 - 8.3 g/dL - - 6.4  Total Bilirubin 0.2 - 1.2 mg/dL - - 1.0  Alkaline Phos 39 - 117 U/L - - 62  AST 0 - 37 U/L - - 22  ALT 0 - 53 U/L - -  22      Microbiology: Recent Results (from the past 240 hour(s))  Urine culture     Status: None   Collection Time: 10/07/19  7:27 AM   Specimen: Urine, Random  Result Value Ref Range Status   Specimen Description   Final    URINE, RANDOM Performed at [facility], [address] [place], [place] [address]    Special Requests   Final    NONE Performed at [facility], [address], [place], [place] [address]    Culture   Final    NO GROWTH Performed at [facility], [place] [address], [place], [place] [address]    Report Status 10/08/2019 FINAL  Final  Blood Culture (routine x 2)     Status: None (Preliminary result)   Collection Time: 10/07/19  8:31 AM   Specimen: BLOOD  Result Value Ref Range Status   Specimen Description BLOOD LAC  Final   Special Requests   Final    BOTTLES DRAWN AEROBIC AND ANAEROBIC Blood Culture adequate volume   Culture  Setup Time   Final    GRAM POSITIVE COCCI IN BOTH AEROBIC AND ANAEROBIC BOTTLES CRITICAL RESULT CALLED TO, READ BACK BY AND VERIFIED WITH: ASAJAH DUNCAN ON 10/07/2019 AT 2035 TIK Performed at [facility], [address] [place], [place] [address]    Culture [facility] POSITIVE COCCI  Final   Report Status PENDING  Incomplete  Blood Culture (routine x 2)     Status: Abnormal (Preliminary result)   Collection Time: 10/07/19  8:31 AM   Specimen: BLOOD  Result Value Ref Range Status   Specimen Description   Final    BLOOD LWRIST Performed at [facility], [address] [place], [place] [address]    Special Requests   Final    BOTTLES DRAWN AEROBIC AND ANAEROBIC Blood Culture adequate volume Performed at [facility], [address] [place], [place] [address]    Culture  Setup Time   Final    GRAM POSITIVE COCCI IN BOTH AEROBIC AND ANAEROBIC BOTTLES CRITICAL RESULT CALLED TO, READ BACK BY AND VERIFIED WITH: ASAJAH DUNCAN ON 10/07/2019 AT 2035 TIK Performed at [facility], [place] [address], [place], [place] [address]    Culture GROUP B STREP(S.AGALACTIAE)ISOLATED (A)  Final   Report Status PENDING  Incomplete  Blood Culture ID Panel (Reflexed)     Status: Abnormal   Collection Time: 10/07/19  8:31 AM  Result Value Ref Range Status   Enterococcus species NOT DETECTED NOT DETECTED Final   Listeria monocytogenes NOT DETECTED NOT DETECTED Final   Staphylococcus species NOT DETECTED NOT DETECTED Final   Staphylococcus aureus (BCID) NOT DETECTED NOT DETECTED Final   Streptococcus species DETECTED (A) NOT DETECTED Final    Comment: CRITICAL RESULT CALLED TO, READ BACK BY AND VERIFIED WITH: ASAJAH DUNCAN ON 10/07/2019 AT 2035 TIK    Streptococcus agalactiae DETECTED (A) NOT DETECTED Final    Comment: CRITICAL RESULT CALLED TO, READ BACK BY AND VERIFIED WITH: ASAJAH DUNCAN ON 10/07/2019 AT 2035  TIK    Streptococcus pneumoniae NOT DETECTED NOT DETECTED Final   Streptococcus pyogenes NOT DETECTED NOT DETECTED Final   Acinetobacter baumannii NOT DETECTED NOT DETECTED Final   Enterobacteriaceae species NOT DETECTED NOT DETECTED Final   Enterobacter cloacae complex NOT DETECTED NOT DETECTED Final   Escherichia coli NOT DETECTED NOT DETECTED Final   Klebsiella oxytoca NOT DETECTED NOT DETECTED Final   Klebsiella pneumoniae NOT DETECTED NOT DETECTED Final   Proteus species NOT DETECTED NOT DETECTED Final   Serratia marcescens NOT DETECTED NOT DETECTED Final   Haemophilus influenzae NOT DETECTED NOT DETECTED Final   Neisseria meningitidis NOT DETECTED NOT DETECTED Final   Pseudomonas aeruginosa NOT DETECTED NOT DETECTED Final   Candida albicans NOT DETECTED NOT DETECTED Final   Candida glabrata NOT DETECTED NOT DETECTED Final   Candida krusei NOT DETECTED NOT DETECTED Final   Candida parapsilosis NOT DETECTED NOT DETECTED Final   Candida tropicalis NOT DETECTED NOT DETECTED Final    Comment: Performed at [facility], [address]  [place], [place] [address]  Respiratory Panel by RT PCR (Flu A&B, Covid) - Nasopharyngeal Swab     Status: None   Collection Time: 10/07/19 12:27 PM   Specimen: Nasopharyngeal Swab  Result Value Ref Range Status   SARS Coronavirus 2 by RT PCR NEGATIVE NEGATIVE Final    Comment: (NOTE) SARS-CoV-2 target nucleic acids are NOT DETECTED. The SARS-CoV-2 RNA is generally detectable in upper respiratoy specimens during the acute phase of infection. The lowest concentration of SARS-CoV-2 viral copies this assay can detect is 131 copies/mL. A negative result does not preclude SARS-Cov-2 infection and should not be used as the sole basis for treatment or other patient management decisions. A negative result may occur with  improper specimen collection/handling, submission of specimen other than nasopharyngeal swab, presence of viral mutation(s) within the areas targeted by this assay, and inadequate number of viral copies (<131 copies/mL). A negative result must be combined with clinical observations, patient history, and epidemiological information. The expected result is Negative. Fact Sheet for Patients:  PinkCheek.be Fact Sheet for Healthcare Providers:  GravelBags.it This test is not yet ap proved or cleared by the [place] FDA and  has been authorized for detection and/or diagnosis of SARS-CoV-2 by FDA under an Emergency Use Authorization (EUA). This EUA will remain  in effect (meaning this test can be used) for the duration of the COVID-19 declaration under Section 564(b)(1) of the Act, 21 U.S.C. section 360bbb-3(b)(1), unless the authorization is terminated or revoked sooner.    Influenza A by PCR NEGATIVE NEGATIVE Final   Influenza B by PCR NEGATIVE NEGATIVE Final    Comment: (NOTE) The Xpert Xpress SARS-CoV-2/FLU/RSV assay is intended as an aid in  the diagnosis of influenza from Nasopharyngeal swab specimens and  should not  be used as a sole basis for treatment. Nasal washings and  aspirates are unacceptable for Xpert Xpress SARS-CoV-2/FLU/RSV  testing. Fact Sheet for Patients: PinkCheek.be Fact Sheet for Healthcare Providers: GravelBags.it This test is not yet approved or cleared by the [place] FDA and  has been authorized for detection and/or diagnosis of SARS-CoV-2 by  FDA under an Emergency Use Authorization (EUA). This EUA will remain  in effect (meaning this test can be used) for the duration of the  Covid-19 declaration under Section 564(b)(1) of the Act, 21  U.S.C. section 360bbb-3(b)(1), unless the authorization is  terminated or revoked. Performed at [facility], [address], [place], [place] [address]  IMAGING RESULTS: Chest x-ray no obvious infiltrate MRI of the cervical spine  and lumbar spine reviewed.  Noncontrast MRI No discitis or paraspinal abscess I have personally reviewed the films ? Impression/Recommendation ? Group B streptococcus bacteremia high bioburden.  Unclear source. He has no skin lesions He has cervical and lumbar pain but MRI without contrast did not show any discitis or paraspinal abscess but we need to still keep that in mind 2D echo has been done We need to rule out endocarditis and he will need TEE We will repeat blood cultures to make sure bacteremia has cleared ? Currently on penicillin.  Continue May need CT abdomen and pelvis if he cannot find the source.  History of fall  AKI on CKD.  Could be from infection and also consumption of NSAID  Question right-sided weakness Assessed by speech and PT Noted to have aspiration and pharyngeal dysphagia  History of right adrenal gland tumor which was pheochromocytoma status post adrenalectomy in 2006  ? ___________________________________________________ Discussed with patient, his wife and his daughter, discussed with  hospitalist and care team. Note:  This document was prepared using Dragon voice recognition software and may include unintentional dictation errors.

## 2019-10-08 NOTE — Progress Notes (Signed)
PT Cancellation Note  Patient Details Name: Clavin Johnsey MRN: [id number] DOB: March 04, 1932   Cancelled Treatment:    Reason Eval/Treat Not Completed: (Consult received and chart reviewed.  Patient currently off unit for MBSS; will re-attempt at later time/date as medically appropriate and available.)   Jakaiya Netherland H. Owens Shark, PT, DPT, NCS 10/08/19, 1:49 PM [phone]

## 2019-10-08 NOTE — Evaluation (Signed)
Objective Swallowing Evaluation: Type of Study: MBS-Modified Barium Swallow Study   Patient Details  Name: Dustin Harrison MRN: [id number] Date of Birth: August 29, 1931  Today's Date: 10/08/2019 Time: SLP Start Time (ACUTE ONLY): 1330 -SLP Stop Time (ACUTE ONLY): 1500  SLP Time Calculation (min) (ACUTE ONLY): 90 min   Past Medical History:  Past Medical History:  Diagnosis Date  . Abnormal glucose   . Arthritis   . Baker's cyst of knee    left  . BPH (benign prostatic hypertrophy)   . Cough    because of "tight" esophagus  . Diabetes mellitus   . Glaucoma   . HOH (hard of hearing)   . Hypertension   . Pheochromocytoma of right adrenal gland   . Ulcer   . Wears hearing aid    bilateral   Past Surgical History:  Past Surgical History:  Procedure Laterality Date  . adrenaletomy    . CATARACT EXTRACTION W/PHACO Left 10/08/2015   Procedure: CATARACT EXTRACTION PHACO AND INTRAOCULAR LENS PLACEMENT ([facility]) left eye;  Surgeon: Leandrew Koyanagi, MD;  Location: [facility];  Service: Ophthalmology;  Laterality: Left;  MALYUGIN  . HERNIA REPAIR    . SQUAMOUS CELL CARCINOMA EXCISION  03-2006   left ear  . TONSILLECTOMY     HPI: Pt is a 86 y.o. male with medical history significant for hypertension, diabetes mellitus, cochlear implant on L w/ HA on R, and chronic low back pain who presents to the emergency room for evaluation of worsening low back pain and generalized weakness.  Patient also complains of pain in his neck which is new.  Per EMS patient has had 2 falls in the past week  but patient is unable to recall the details of the falls. MRI of the cervical spine showed cervical spondylosis and degenerative disc disease, causing prominent impingement at C3-4 and C4-5; moderate impingement at C2-3; and mild impingement at C5-6 and C7-T1. MRI of the lumbar spine showed lumbar spondylosis and degenerative disc disease, causing moderate impingement at L4-5 and mild impingement at L3-4  and L5-S1.  Of note, per past history, pt has been told he has a cough "because of tight esophagus". Pt endorsed ongoing swallowing problems "for awhile now" when asked.    Subjective: pt sitting in mbss chair; Dysphonia noted during speech. Pt exhibited RUE weakness also.     Assessment / Plan / Recommendation  CHL IP CLINICAL IMPRESSIONS 10/08/2019  Clinical Impression Pt appears to present w/ Moderate-Severe pharyngeal-esophageal phase Dysphagia w/ both Aspiration and Laryngeal Penetration noted during this exam. Pt is at High risk for Aspiration to occur w/ oral intake of any consistency. During the oral phase, decreased bolus control and organization for timely A-P transfer for swallow -- premature spillage noted w/ thin and Nectar liquids. Adequate bolus mangement w/ trials of Puree and increased viscosity of liquids(Honey). Slight-Min BOT residue noted. During the pharyngeal phase, moderate+ delay in pharyngeal swallow initiation w/ thin and Nectar liquids spilling from the Valleculae to the Pyriform Sinuses b/f pharyngeal swallow occurred. This resulted in Aspiration of both thin and Nectar liquids - noted little to no airway closure(protection) as liquids spilled deep into pharynx. W/ Honey consistency liquids, pharyngeal swallow initiation did not occur until the liquid was spilling from the Valleculae. During the swallow, decreased laryngeal excursion and anterior movement noted; decreased pharyngeal pressure noted. This resulted in Moderate+ pharyngeal residue remaining throughout the pharynx: BOT, Valleculae, Pharyngeal Wall, and Pyriform Sinuses. Also suspect the impact from bolus material  NOT clearing the UES in an efficient manner contributed to the increased pharyngeal residue. Due to the amount of pharyngeal residue after the swallow, Laryngeal Penetration was noted to occur b/t trials and as pt attempted f/u, Dry swallows to clear the residue. Suspect small amounts of Aspiration to occur  from the Laryngeal Penetration b/t trials also. Pt was NOT immediate Sensate to the Laryngeal Penetration or Aspiration occurring during this exam(delayed throat clearing/cough occurred; pt was also cued to cough hard when aspiration noted). During the Esophageal phase, pt presented w/ a Kyphotic appearance in the Cervical spine; also noted impact from C5-6 on the efficiency of the UES opening and the ability to clear bolus material through the UES. Only small amounts of bolus material moved through the UES at a time; multiple Dry swallows aided in clearing some of the pharyngeal residue remaining but impedement noted. This increased the risk for the pharyngeal residue to become Laryngeal Penetration. Due to the presentation of this exam and pt's POC currently, a recommendation for NPO Status is made. NSG and MD made aware of the results of this exam. Precautions posted.   SLP Visit Diagnosis Dysphagia, oropharyngeal phase (R13.12);Dysphagia, pharyngoesophageal phase (R13.14)  Attention and concentration deficit following --  Frontal lobe and executive function deficit following --  Impact on safety and function Moderate aspiration risk;Severe aspiration risk;Risk for inadequate nutrition/hydration      CHL IP TREATMENT RECOMMENDATION 10/08/2019  Treatment Recommendations No treatment recommended at this time     Prognosis 10/08/2019  Prognosis for Safe Diet Advancement Guarded  Barriers to Reach Goals Time post onset;Severity of deficits  Barriers/Prognosis Comment --    CHL IP DIET RECOMMENDATION 10/08/2019  SLP Diet Recommendations NPO  Liquid Administration via --  Medication Administration Via alternative means  Compensations --  Postural Changes --      CHL IP OTHER RECOMMENDATIONS 10/08/2019  Recommended Consults (No Data)  Oral Care Recommendations Oral care QID;Staff/trained caregiver to provide oral care  Other Recommendations (No Data)      CHL IP FOLLOW UP RECOMMENDATIONS 10/08/2019   Follow up Recommendations (No Data)      CHL IP FREQUENCY AND DURATION 10/08/2019  Speech Therapy Frequency (ACUTE ONLY) (No Data)  Treatment Duration (No Data)           CHL IP ORAL PHASE 10/08/2019  Oral Phase Impaired  Oral - Pudding Teaspoon --  Oral - Pudding Cup --  Oral - Honey Teaspoon --  Oral - Honey Cup --  Oral - Nectar Teaspoon --  Oral - Nectar Cup --  Oral - Nectar Straw --  Oral - Thin Teaspoon --  Oral - Thin Cup --  Oral - Thin Straw --  Oral - Puree --  Oral - Mech Soft --  Oral - Regular --  Oral - Multi-Consistency --  Oral - Pill --  Oral Phase - Comment decreased bolus control and organization for timely A-P transfer for swallow -- premature spillage noted w/ thin and Nectar liquids. Adequate bolus mangement w/ trials of Puree and increased viscosity of liquids(Honey). Slight-Min BOT residue noted    CHL IP PHARYNGEAL PHASE 10/08/2019  Pharyngeal Phase Impaired  Pharyngeal- Pudding Teaspoon --  Pharyngeal --  Pharyngeal- Pudding Cup --  Pharyngeal --  Pharyngeal- Honey Teaspoon --  Pharyngeal --  Pharyngeal- Honey Cup --  Pharyngeal --  Pharyngeal- Nectar Teaspoon --  Pharyngeal --  Pharyngeal- Nectar Cup --  Pharyngeal --  Pharyngeal- Nectar Straw --  Pharyngeal --  Pharyngeal- Thin Teaspoon --  Pharyngeal --  Pharyngeal- Thin Cup --  Pharyngeal --  Pharyngeal- Thin Straw --  Pharyngeal --  Pharyngeal- Puree --  Pharyngeal --  Pharyngeal- Mechanical Soft --  Pharyngeal --  Pharyngeal- Regular --  Pharyngeal --  Pharyngeal- Multi-consistency --  Pharyngeal --  Pharyngeal- Pill --  Pharyngeal --  Pharyngeal Comment moderate+ delay in pharyngeal swallow initiation w/ thin and Nectar liquids spilling from the Valleculae to the Pyriform Sinuses b/f pharyngeal swallow occurred. This resulted in Aspiration of both thin and Nectar liquids - noted little to no airway closure(protection) as liquids spilled deep into pharynx. W/ Honey consistency  liquids, pharyngeal swallow initiation did not occur until the liquid was spilling from the Valleculae. During the swallow, decreased laryngeal excursion and anterior movement noted; decreased pharyngeal pressure noted. This resulted in Moderate+ pharyngeal residue remaining throughout the pharynx: BOT, Valleculae, Pharyngeal Wall, and Pyriform Sinuses. Also suspect the impact from bolus material NOT clearing the UES in an efficient manner contributed to the increased pharyngeal residue. Due to the amount of pharyngeal residue after the swallow, Laryngeal Penetration was noted to occur b/t trials and as pt attempted f/u, Dry swallows to clear the residue. Suspect small amounts of Aspiration to occur from the Laryngeal Penetration b/t trials also. Pt was NOT immediate Sensate to the Laryngeal Penetration or Aspiration occurring during this exam(delayed throat clearing/cough occurred; pt was also cued to cough hard when aspiration noted).      CHL IP CERVICAL ESOPHAGEAL PHASE 10/08/2019  Cervical Esophageal Phase Impaired  Pudding Teaspoon --  Pudding Cup --  Honey Teaspoon --  Honey Cup --  Nectar Teaspoon --  Nectar Cup --  Nectar Straw --  Thin Teaspoon --  Thin Cup --  Thin Straw --  Puree --  Mechanical Soft --  Regular --  Multi-consistency --  Pill --  Cervical Esophageal Comment pt presented w/ a Kyphotic appearance in the Cervical spine; also noted impact from C5-6 on the efficiency of the UES opening and the ability to clear bolus material through the UES. Only small amounts of bolus material moved through the UES at a time; multiple Dry swallows aided in clearing some of the pharyngeal residue remaining but impedement noted. This increased the risk for the pharyngeal residue to become Laryngeal Penetration.         Orinda Kenner, [place], CCC-SLP Corderius Saraceni 10/08/2019, 3:27 PM

## 2019-10-08 NOTE — Evaluation (Signed)
Physical Therapy Evaluation Patient Details Name: Dustin Harrison MRN: [id number] DOB: 11/21/1931 Today's Date: 10/08/2019   History of Present Illness  presented to ER secondary to progressive neck/back pain and R UE weakness after fall 2 days prior to admission; admitted for management of AKI and bacteremia (unknown source at this time).  C-spine, T-spine and L-spine imaging significant for prominent C3-4 and C4-5 impingement, moderate C2-3 and L4-5 impingement and mild C5-6, C7-T1, L3-4 and L5-S1 impingement  Clinical Impression  Patient just returned from [facility] and completing ECHO upon arrival to room; wife and daughter at bedside.  Alert and agreeable to session.  Oriented to basic information; follows commands; pleasant and cooperative.  Voice generally hoarse and dysphonic; requiring purposeful effort for articulation and volume as able.  Patient maintains very rigid cervical posturing throughout session; demonstrating very minimal/no cervical rotation, flexion or extension with functional activities.  Does endorse R wrist pain (after fall); may benefit from imaging to rule out bony injury. General strength and ROM assessment significant for moderate weakness to R hemi-body (UE > LE) with moderate coordination, fine motor deficits.  Denies sensory deficit, though question full sensory/propriceptive awareness of extremities (esp R UE).  R LE with positive babinski test; notable also for mild facial droop and tongue deviation to R with protrusion.  May benefit from MRI of brain to rule out additional neurological insult.  RN/MD informed/aware of patient presentation; neurology consult entered per attending. Currently requiring mod assist for bed mobility; mod assist for sit/stand, forward/backward/lateral stepping edge of bed without assist device.  Demonstrates broad BOS with mild R lateral lean/weight shift; manual assist for L ant/lateral weight shift to unweight/advance/clear R LE with stepping  efforts.  Generally discoordinated and with poor balance.  Additional mobility deferred secondary to ID physician at bedside for assessment.  Will continue to further assess/progress as medically appropriate. Would benefit from skilled PT to address above deficits and promote optimal return to PLOF.; recommend transition to acute inpatient rehab upon discharge for high-intensity, post-acute rehab services.      Follow Up Recommendations CIR    Equipment Recommendations       Recommendations for Other Services       Precautions / Restrictions Precautions Precautions: Fall Precaution Comments: NPO Restrictions Weight Bearing Restrictions: No      Mobility  Bed Mobility Overal bed mobility: Needs Assistance Bed Mobility: Supine to Sit;Sit to Supine     Supine to sit: Mod assist Sit to supine: Mod assist;Max assist   General bed mobility comments: assist for truncal elevation and R UE position/protection  Transfers Overall transfer level: Needs assistance   Transfers: Sit to/from Stand Sit to Stand: Mod assist            Ambulation/Gait Ambulation/Gait assistance: Mod assist Gait Distance (Feet): (5' forward/backward/laterally (x2) each)         General Gait Details: broad BOS with mild R lateral lean/weight shift; manual assist for L ant/lateral weight shift to unweight/advance/clear R LE with stepping efforts.  Generally discoordinated and with poor balance.  Stairs            Wheelchair Mobility    Modified Rankin (Stroke Patients Only)       Balance Overall balance assessment: Needs assistance Sitting-balance support: Feet supported;No upper extremity supported Sitting balance-Leahy Scale: Good     Standing balance support: No upper extremity supported Standing balance-Leahy Scale: Poor  Pertinent Vitals/Pain Pain Assessment: Faces Faces Pain Scale: Hurts even more Pain Location: R  wrist/hand Pain Descriptors / Indicators: Aching;Grimacing;Guarding Pain Intervention(s): Limited activity within patient's tolerance;Monitored during session    [place] expects to be discharged to:: Private residence Living Arrangements: Spouse/significant other Available Help at Discharge: Family;Available PRN/intermittently           Home Equipment: Kasandra Knudsen - single point Additional Comments: Resident of [facility]    Prior Function Level of Independence: Independent with assistive device(s)         Comments: Mod indep with SPC for ADLs, household and community mobilization; walks daily; denies additional fall history.     Hand Dominance   Dominant Hand: Right    Extremity/Trunk Assessment   Upper Extremity Assessment Upper Extremity Assessment: (R UE grossly 3-/5 with moderate fine motor/coordination deficits appreciated; denies sensory deficit, though question full proprioceptive awareness.  L UE grossly WFL)    Lower Extremity Assessment Lower Extremity Assessment: (R LE grossly 4-/5 with mild/mod coordination deficits; denies sensory deficit; + babinski R foot.  L LE grossly WFL)    Cervical / Trunk Assessment Cervical / Trunk Assessment: (absent active cervical rotation, flexion or extension; very guarded, relying on movement of trunk to compensate)  Communication   Communication: (hoarse and dysphonic)  Cognition Arousal/Alertness: Awake/alert Behavior During Therapy: WFL for tasks assessed/performed Overall Cognitive Status: Within Functional Limits for tasks assessed                                        General Comments      Exercises Other Exercises Other Exercises: Sit/stand x4 reps, mod assist for lift off, balance (esp in A/P plane) Other Exercises: Standing balance to use urinal/empty bladder, mod assist; unable to functionally utilize/maintain grasp/coordinate R UE for purposeful use with functional  task   Assessment/Plan    PT Assessment Patient needs continued PT services  PT Problem List Decreased strength;Decreased range of motion;Decreased activity tolerance;Decreased balance;Decreased mobility;Decreased cognition;Decreased knowledge of use of DME;Decreased coordination;Decreased safety awareness;Decreased knowledge of precautions;Cardiopulmonary status limiting activity;Pain       PT Treatment Interventions DME instruction;Gait training;Functional mobility training;Therapeutic exercise;Balance training;Therapeutic activities;Neuromuscular re-education;Cognitive remediation;Patient/family education    PT Goals (Current goals can be found in the Care Plan section)  Acute Rehab PT Goals Patient Stated Goal: to get better PT Goal Formulation: With patient/family Time For Goal Achievement: 10/22/19 Potential to Achieve Goals: Good    Frequency 7X/week   Barriers to discharge Decreased caregiver support      Co-evaluation               AM-PAC PT "6 Clicks" Mobility  Outcome Measure Help needed turning from your back to your side while in a flat bed without using bedrails?: A Little Help needed moving from lying on your back to sitting on the side of a flat bed without using bedrails?: A Lot Help needed moving to and from a bed to a chair (including a wheelchair)?: A Lot Help needed standing up from a chair using your arms (e.g., wheelchair or bedside chair)?: A Lot Help needed to walk in hospital room?: A Lot Help needed climbing 3-5 steps with a railing? : A Lot 6 Click Score: 13    End of Session Equipment Utilized During Treatment: Gait belt Activity Tolerance: Patient tolerated treatment well Patient left: in bed;with call bell/phone within reach;with bed  alarm set Nurse Communication: Mobility status PT Visit Diagnosis: Muscle weakness (generalized) (M62.81);Difficulty in walking, not elsewhere classified (R26.2);Hemiplegia and hemiparesis Hemiplegia -  Right/Left: Right Hemiplegia - dominant/non-dominant: Dominant Hemiplegia - caused by: Unspecified    Time: [id number] PT Time Calculation (min) (ACUTE ONLY): 61 min   Charges:   PT Evaluation $PT Eval High Complexity: 1 High PT Treatments $Gait Training: 8-22 mins $Therapeutic Activity: 23-37 mins        Therisa Mennella H. Owens Shark, PT, DPT, NCS 10/08/19, 10:42 PM [phone]

## 2019-10-08 NOTE — Progress Notes (Signed)
*  PRELIMINARY RESULTS* Echocardiogram 2D Echocardiogram has been performed.  Dustin Harrison 10/08/2019, 2:33 PM

## 2019-10-09 ENCOUNTER — Inpatient Hospital Stay: Payer: Medicare PPO

## 2019-10-09 DIAGNOSIS — D696 Thrombocytopenia, unspecified: Secondary | ICD-10-CM

## 2019-10-09 DIAGNOSIS — I429 Cardiomyopathy, unspecified: Secondary | ICD-10-CM

## 2019-10-09 DIAGNOSIS — I5021 Acute systolic (congestive) heart failure: Secondary | ICD-10-CM

## 2019-10-09 DIAGNOSIS — R1312 Dysphagia, oropharyngeal phase: Secondary | ICD-10-CM

## 2019-10-09 DIAGNOSIS — R7881 Bacteremia: Secondary | ICD-10-CM

## 2019-10-09 LAB — BASIC METABOLIC PANEL
Anion gap: 6 (ref 5–15)
BUN: 42 mg/dL — ABNORMAL HIGH (ref 8–23)
CO2: 24 mmol/L (ref 22–32)
Calcium: 8 mg/dL — ABNORMAL LOW (ref 8.9–10.3)
Chloride: 105 mmol/L (ref 98–111)
Creatinine, Ser: 1.22 mg/dL (ref 0.61–1.24)
GFR calc Af Amer: >60 mL/min (ref >60)
GFR calc non Af Amer: 54 mL/min — ABNORMAL LOW (ref >60)
Glucose, Bld: 164 mg/dL — ABNORMAL HIGH (ref 70–99)
Potassium: 4.3 mmol/L (ref 3.5–5.1)
Sodium: 135 mmol/L (ref 135–145)

## 2019-10-09 LAB — CULTURE, BLOOD (ROUTINE X 2)
Special Requests: ADEQUATE
Special Requests: ADEQUATE

## 2019-10-09 LAB — CBC
HCT: 44.7 % (ref 39.0–52.0)
Hemoglobin: 15.1 g/dL (ref 13.0–17.0)
MCH: 30.7 pg (ref 26.0–34.0)
MCHC: 33.8 g/dL (ref 30.0–36.0)
MCV: 90.9 fL (ref 80.0–100.0)
Platelets: 80 10*3/uL — ABNORMAL LOW (ref 150–400)
RBC: 4.92 MIL/uL (ref 4.22–5.81)
RDW: 13.1 % (ref 11.5–15.5)
WBC: 21 10*3/uL — ABNORMAL HIGH (ref 4.0–10.5)
nRBC: 0 % (ref 0.0–0.2)

## 2019-10-09 LAB — GLUCOSE, CAPILLARY
Glucose-Capillary: 142 mg/dL — ABNORMAL HIGH (ref 70–99)
Glucose-Capillary: 129 mg/dL — ABNORMAL HIGH (ref 70–99)
Glucose-Capillary: 156 mg/dL — ABNORMAL HIGH (ref 70–99)
Glucose-Capillary: 147 mg/dL — ABNORMAL HIGH (ref 70–99)

## 2019-10-09 IMAGING — MR MR HEAD W/O CM
12 series · 46 of 48 positions shown · non-contrast
Comparison: None.

CLINICAL DATA: Right upper extremity weakness

EXAM:
MRI HEAD WITHOUT CONTRAST
TECHNIQUE: Multiplanar, multiecho pulse sequences of the brain and surrounding
structures were obtained without intravenous contrast.

[Series 5: ax dwi_tracew · axial · 3.0mm · 0.60mm/px · z∈[-69,+78]mm · 4 of 46 slices shown]
[im 1/46]
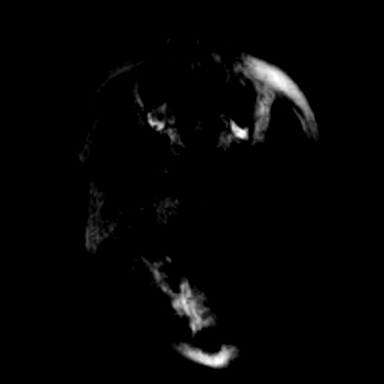
[im 16/46]
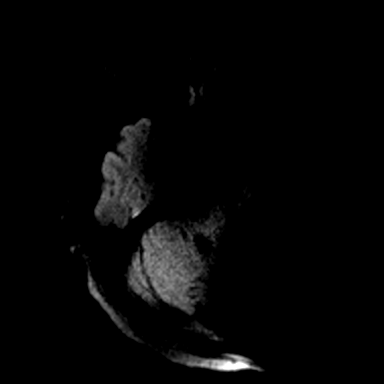
[im 31/46]
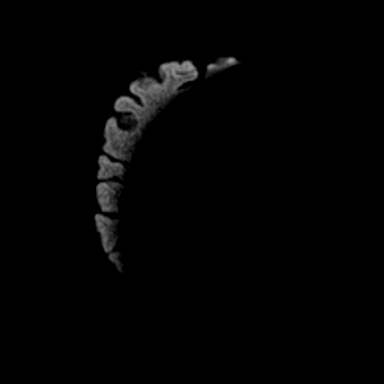
[im 46/46]
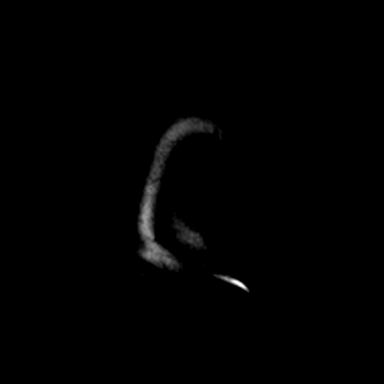

[Series 6: ax dwi_adc · axial · 3.0mm · 0.60mm/px · z∈[-69,+78]mm · 3 of 47 slices shown]
[im 1/47]
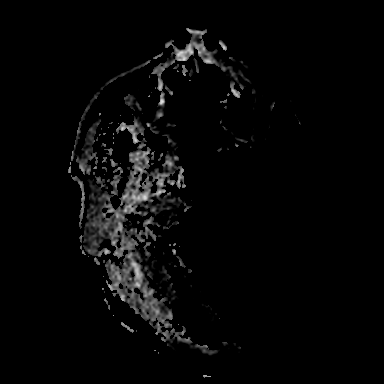
[im 24/47]
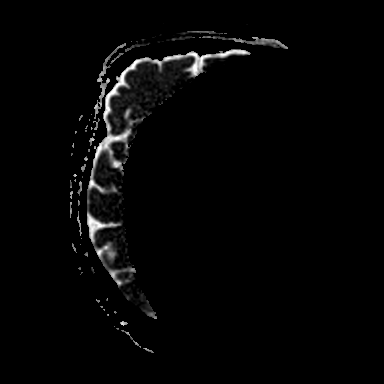
[im 47/47]
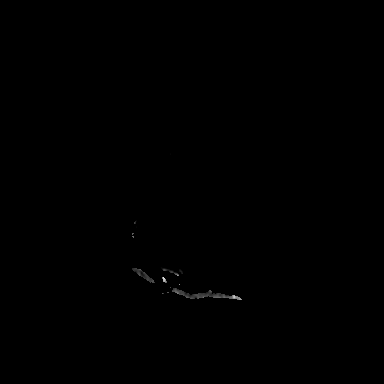

[Series 7: cor dwi_tracew · coronal · 5.0mm · 0.60mm/px · 3 of 40 slices shown]
[im 1/40]
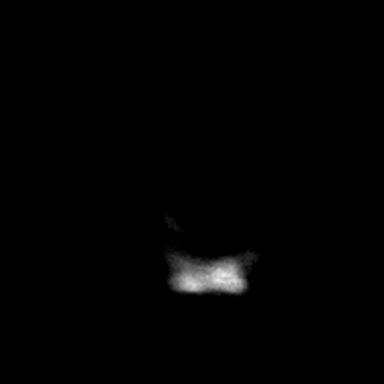
[im 20/40]
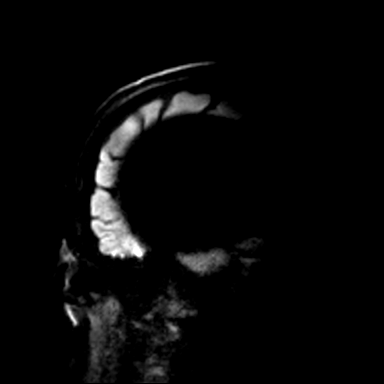
[im 40/40]
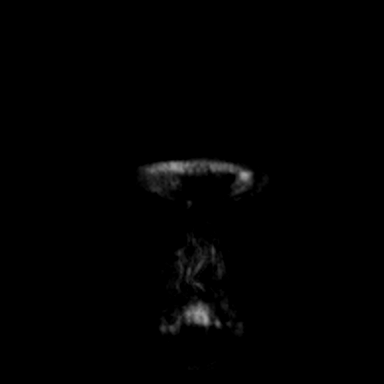

[Series 8: cor dwi_adc · coronal · 5.0mm · 0.60mm/px · 3 of 40 slices shown]
[im 1/40]
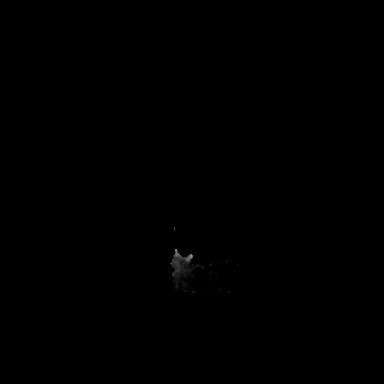
[im 20/40]
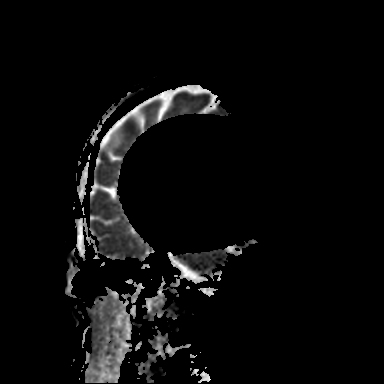
[im 40/40]
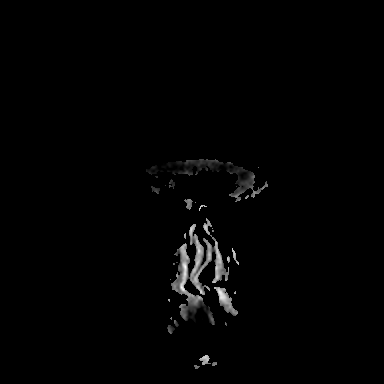

[Series 9: T1 · sagittal · 5.0mm · 0.62mm/px · 2 of 23 slices shown (1 of 2)]
[im 1/23]
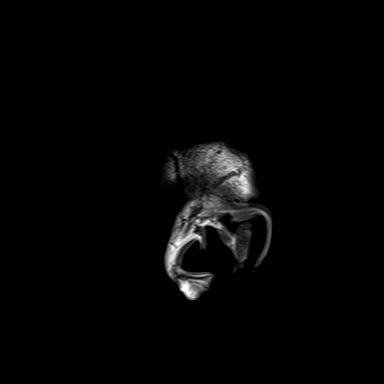
[im 23/23]
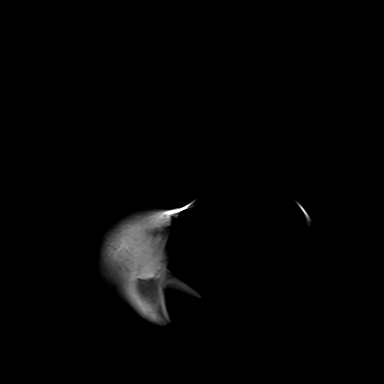

[Series 10: T2 · axial · 5.0mm · 0.53mm/px · z∈[-70,+81]mm · 2 of 27 slices shown (1 of 2)]
[im 1/27]
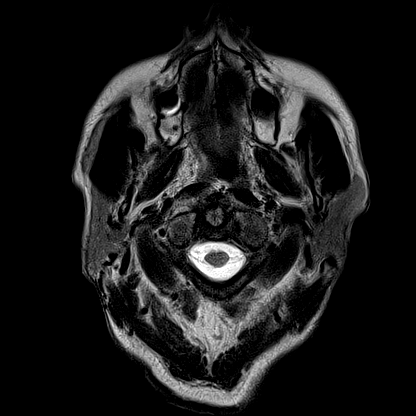
[im 27/27]
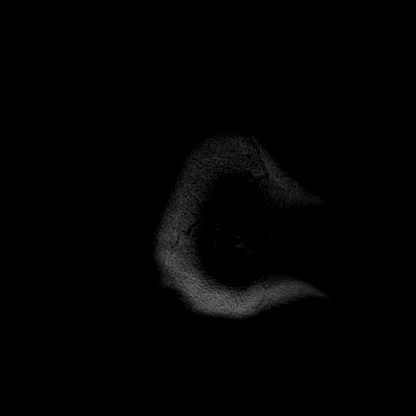

[Series 11: mag_images · axial · 3.0mm · 0.90mm/px · z∈[-79,+93]mm · 4 of 60 slices shown]
[im 1/60]
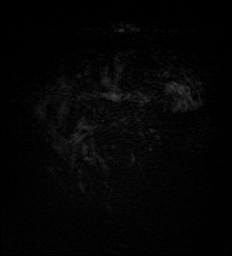
[im 20/60]
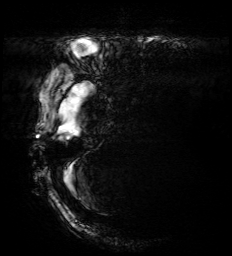
[im 40/60]
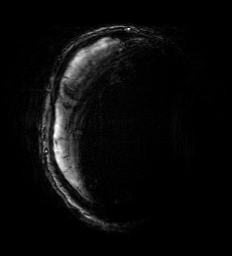
[im 60/60]
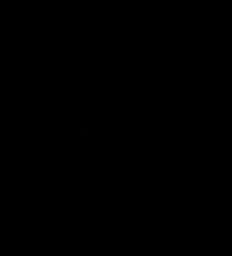

[Series 12: pha_images · axial · 3.0mm · 0.90mm/px · z∈[-79,+93]mm · 4 of 59 slices shown]
[im 1/59]
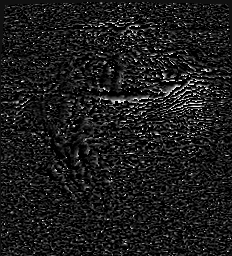
[im 20/59]
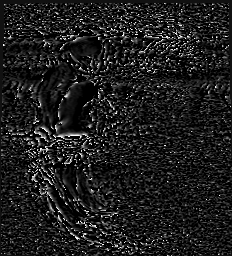
[im 39/59]
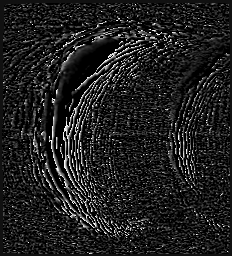
[im 59/59]
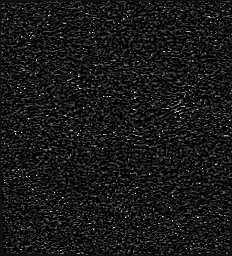

[Series 13: swi_images · axial · 3.0mm · 0.90mm/px · z∈[-79,+93]mm · 4 of 60 slices shown]
[im 1/60]
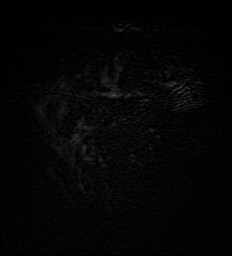
[im 20/60]
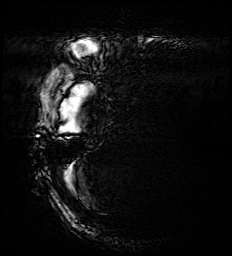
[im 40/60]
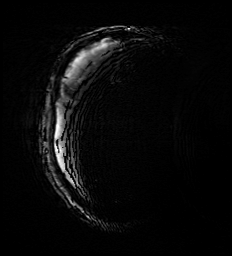
[im 60/60]
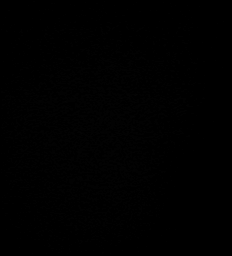

[Series 15: FLAIR · axial · 3.0mm · 0.53mm/px · z∈[-73,+84]mm · 4 of 55 slices shown]
[im 1/55]
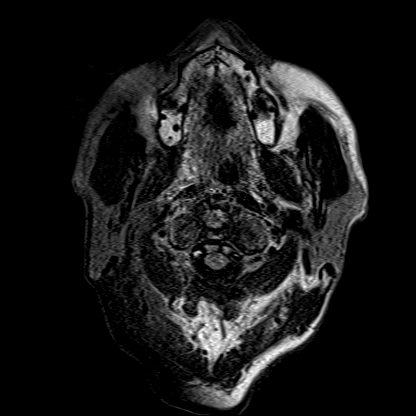
[im 19/55]
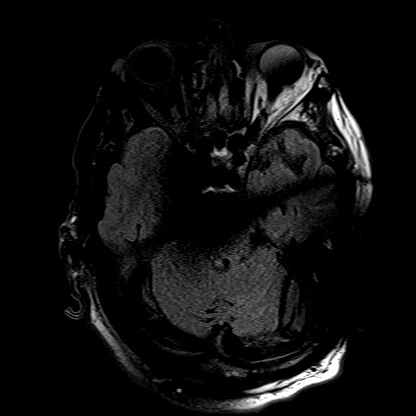
[im 37/55]
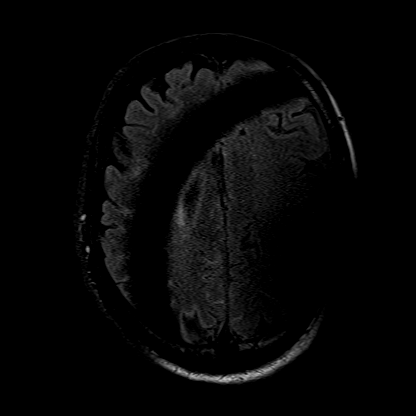
[im 55/55]
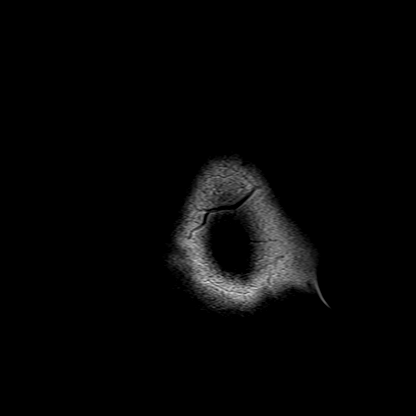

[Series 16: T1 · axial · 1.0mm · 0.98mm/px · z∈[-76,+94]mm · 11 of 176 slices shown (2 of 2)]
[im 1/176]
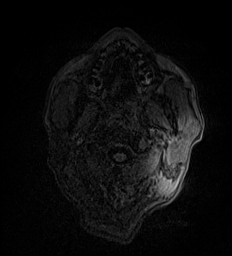
[im 15/176]
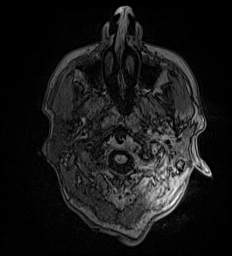
[im 30/176]
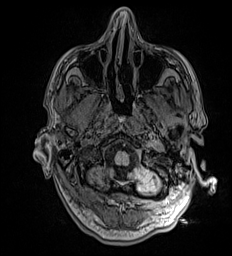
[im 44/176]
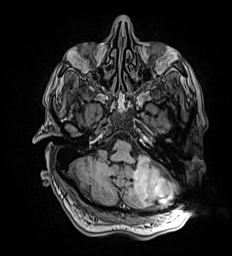
[im 59/176]
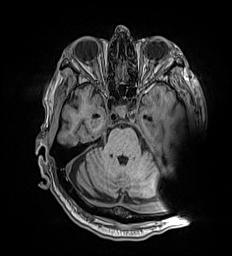
[im 73/176]
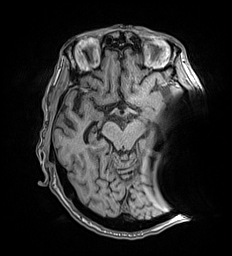
[im 88/176]
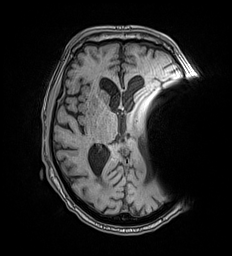
[im 103/176]
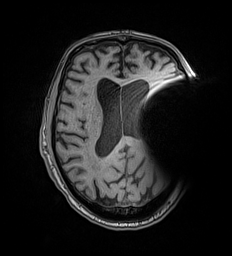
[im 117/176]
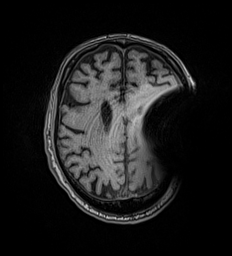
[im 146/176]
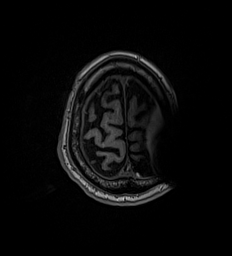
[im 176/176]
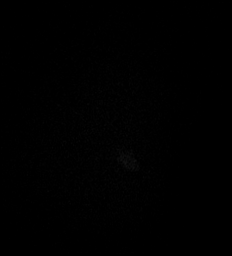

[Series 17: T2 · coronal · 5.0mm · 0.57mm/px · 2 of 30 slices shown (2 of 2)]
[im 1/30]
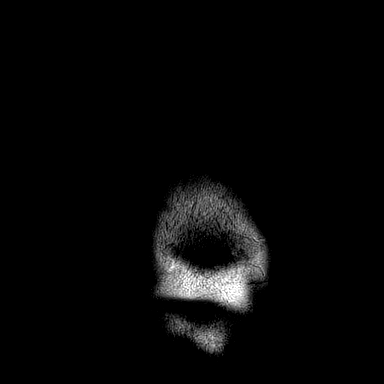
[im 30/30]
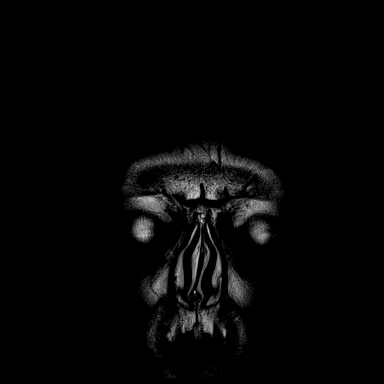

[46 of 48 positions shown; findings below may reference images not displayed]

FINDINGS: There is substantial artifact related to left cochlear implant.
Majority of the brain is obscured on diffusion weighted imaging.
Susceptibility weighted imaging is nondiagnostic. Ventricles appear
stable in size. Probable chronic microvascular ischemic changes.
IMPRESSION: Substantial artifact related to cochlear implant. Acute infarction
causing reported symptoms cannot be excluded.

## 2019-10-09 MED ORDER — LOSARTAN POTASSIUM 25 MG PO TABS
12.5000 mg | ORAL_TABLET | Freq: Every day | ORAL | Status: DC
  Administered 2019-10-12: 12.5 mg via ORAL
  Filled 2019-10-09: qty 1

## 2019-10-09 MED ORDER — CARVEDILOL 3.125 MG PO TABS
3.1250 mg | ORAL_TABLET | Freq: Two times a day (BID) | ORAL | Status: DC
  Filled 2019-10-09 (×2): qty 1

## 2019-10-09 NOTE — Progress Notes (Signed)
ID Pt feeling better today eventhough just received morphine for rt hand pain Patient Vitals for the past 24 hrs:  BP Temp Temp src Pulse Resp SpO2  10/09/19 1951 (!) 176/110 98.4 F (36.9 C) Oral 99 20 96 %  10/09/19 1211 (!) 151/96 98.3 F (36.8 C) -- 84 16 99 %  10/09/19 0315 (!) 148/94 97.8 F (36.6 C) Oral 85 20 97 %   O/e awake, respondign appropriately Chest CTA [id number] irregular abd soft CNS moves rt arm but less because of pain  CBC Latest Ref Rng & Units 10/09/2019 10/08/2019 10/07/2019  WBC 4.0 - 10.5 K/uL 21.0(H) 23.5(H) 28.1(H)  Hemoglobin 13.0 - 17.0 g/dL 15.1 15.1 14.4  Hematocrit 39.0 - 52.0 % 44.7 44.9 43.4  Platelets 150 - 400 K/uL 80(L) 99(L) 121(L)    CMP Latest Ref Rng & Units 10/09/2019 10/08/2019 10/07/2019  Glucose 70 - 99 mg/dL 164(H) 148(H) 140(H)  BUN 8 - 23 mg/dL 42(H) 48(H) 66(H)  Creatinine 0.61 - 1.24 mg/dL 1.22 1.61(H) 2.44(H)  Sodium 135 - 145 mmol/L 135 135 134(L)  Potassium 3.5 - 5.1 mmol/L 4.3 4.2 5.0  Chloride 98 - 111 mmol/L 105 104 102  CO2 22 - 32 mmol/L 24 23 23   Calcium 8.9 - 10.3 mg/dL 8.0(L) 8.3(L) 8.6(L)  Total Protein 6.0 - 8.3 g/dL - - -  Total Bilirubin 0.2 - 1.2 mg/dL - - -  Alkaline Phos 39 - 117 U/L - - -  AST 0 - 37 U/L - - -  ALT 0 - 53 U/L - - -    Micro Bc -10/07/19 GBS 5/4 BC sent  2d echo EF 25-30%  Impression/recommendation Group B streptococcus bacteremia .   Unclear source. He has no skin lesions He has cervical and lumbar pain but MRI without contrast did not show any discitis or paraspinal abscess but we need to still keep that in mind 2D echo has been done- suboptimal study- EF low We need to rule out endocarditis and he will need TEE We will repeat blood cultures to make sure bacteremia has cleared ? Currently on penicillin.  Continue May need CT abdomen and pelvis if he cannot find the source.  History of fall  AKI on CKD. improving  Assessed by speech and PT Noted to have aspiration and pharyngeal  dysphagia  History of right adrenal gland tumor which was pheochromocytoma status post adrenalectomy in 2006  Discussed with patient's family

## 2019-10-09 NOTE — Evaluation (Signed)
Occupational Therapy Evaluation Patient Details Name: Dustin Harrison MRN: [id number] DOB: 1931-12-26 Today's Date: 10/09/2019    History of Present Illness presented to ER secondary to progressive neck/back pain and R UE weakness after fall 2 days prior to admission; admitted for management of AKI and bacteremia (unknown source at this time).  C-spine, T-spine and L-spine imaging significant for prominent C3-4 and C4-5 impingement, moderate C2-3 and L4-5 impingement and mild C5-6, C7-T1, L3-4 and L5-S1 impingement   Clinical Impression   Pt was seen for OT evaluation this date. Prior to hospital admission, pt was independent with ADL and IADL, using a SPC for mobility, still driving, and caring for his wife (~Min A for ADL and mobility). Pt lives with his wife in a [place] with 2 steps and L rail to enter from garage.  Currently pt demonstrates impairments as described below (See OT problem list) which functionally limit his ability to perform ADL/self-care tasks and ADL mobility. Pt currently requires Min A for self feeding (however currently NPO) and grooming, Mod-Max assist for bathing and dressing, and CGA to Min A for toileting.  Pt would benefit from skilled OT to address noted impairments and functional limitations (see below for any additional details) in order to maximize safety and independence while minimizing falls risk and caregiver burden. Upon hospital discharge, recommend high intensity skilled OT services within CIR to maximize pt safety and return to PLOF.     Follow Up Recommendations  CIR    Equipment Recommendations  Other (comment)(TBD pending next level of care)    Recommendations for Other Services       Precautions / Restrictions Precautions Precautions: Fall Precaution Comments: NPO Restrictions Weight Bearing Restrictions: No      Mobility Bed Mobility Overal bed mobility: Needs Assistance Bed Mobility: Supine to Sit;Sit to Supine     Supine to sit:  Mod assist;Max assist;HOB elevated Sit to supine: Max assist   General bed mobility comments: assist for truncal elevation, minimal truncal flexion, and BLE mgt back to bed  Transfers Overall transfer level: Needs assistance Equipment used: 1 person hand held assist Transfers: Sit to/from Stand Sit to Stand: Min assist;Mod assist;+2 physical assistance         General transfer comment: Min A handheld on L side and Min-mod assist on R side    Balance Overall balance assessment: Needs assistance Sitting-balance support: Feet supported;No upper extremity supported Sitting balance-Leahy Scale: Good     Standing balance support: No upper extremity supported;During functional activity Standing balance-Leahy Scale: Fair Standing balance comment: Able to manage urinal in L hand for toileting in standing requiring CGAx 1-2                           ADL either performed or assessed with clinical judgement   ADL Overall ADL's : Needs assistance/impaired Eating/Feeding: NPO;Sitting;Minimal assistance Eating/Feeding Details (indicate cue type and reason): using L non-dom hand, Min A for facilitation of shoulder flexion, pt able to bring swap to mouth Grooming: Wash/dry hands;Wash/dry face;Oral care;Sitting;Brushing hair;Minimal assistance Grooming Details (indicate cue type and reason): using L non-dom hand, Min A for facilitation of shoulder flexion, pt able to bring washcloth to face and comb to hair Upper Body Bathing: Sitting;Moderate assistance   Lower Body Bathing: Sit to/from stand;Maximal assistance   Upper Body Dressing : Sitting;Minimal assistance;Moderate assistance   Lower Body Dressing: Sit to/from stand;Maximal assistance   Toilet Transfer: Minimal assistance;Ambulation;BSC   Toileting-  Clothing Manipulation and Hygiene: Min guard;Sit to/from stand;Set up [place] Manipulation Details (indicate cue type and reason): with CGA x 1-2, pt able to  manage urinal for toileting in standing without LOB             Vision Baseline Vision/History: Wears glasses Wears Glasses: At all times Patient Visual Report: No change from baseline Vision Assessment?: No apparent visual deficits Additional Comments: Pt appeared visually scan/track to R side more than L, but unclear if formal deficit, will continue to assess next session     Perception     Praxis      Pertinent Vitals/Pain Pain Assessment: 0-10 Pain Score: 9  Pain Location: 4-9/10 pain, R wrist/hand, chronic cervical and lumbar spine but worse now Pain Descriptors / Indicators: Aching;Grimacing;Guarding Pain Intervention(s): Limited activity within patient's tolerance;Monitored during session;Repositioned     Hand Dominance Right   Extremity/Trunk Assessment Upper Extremity Assessment Upper Extremity Assessment: RUE deficits/detail;LUE deficits/detail RUE Deficits / Details: 3-/5 shoulder flexion (stiff, minimal pain), at least 3/5 elbow flex/ext, wrist/hand significantly pain limited and edematous, denies sensory deficits RUE: Unable to fully assess due to pain RUE Coordination: decreased fine motor;decreased gross motor LUE Deficits / Details: 3-/5 shoulder flexion (stiff, minimal pain), grossly at least 4/5 elbow and hand LUE Sensation: WNL LUE Coordination: decreased gross motor   Lower Extremity Assessment Lower Extremity Assessment: RLE deficits/detail;LLE deficits/detail RLE Deficits / Details: grossly 4-/5, denies sensory deficits, decreased coordination RLE Coordination: decreased gross motor LLE Deficits / Details: grossly 4-/5, denies sensory deficits   Cervical / Trunk Assessment Cervical / Trunk Assessment: Other exceptions;Kyphotic Cervical / Trunk Exceptions: absent active cervical rotation, flexion or extension; very guarded, relying on movement of trunk to compensate   Communication Communication Communication: Other (comment)(hoarse and  dysphonic)   Cognition Arousal/Alertness: Awake/alert Behavior During Therapy: WFL for tasks assessed/performed Overall Cognitive Status: Within Functional Limits for tasks assessed                                     General Comments  R hand/wrist edema noted, MD aware    Exercises Other Exercises Other Exercises: Functional transfer training for seated and standing ADL tasks   Shoulder Instructions      Home Living Family/patient expects to be discharged to:: Private residence([place]) Living Arrangements: Spouse/significant other Available Help at Discharge: Family;Available PRN/intermittently(dtr lives in [place]) Type of Home: House([place] at [facility]) Home Access: Stairs to enter [employer] of Steps: from primary entrance - garage 2 steps w/ L rail and R cabinet, 3 steps from [place]: Left Home Layout: Two level;Able to live on main level with bedroom/bathroom Alternate Level Stairs-Number of Steps: (primarily storage on 2nd floor)   Bathroom Shower/Tub: Occupational psychologist: Standard     Home Equipment: Kasandra Knudsen - single point;Grab bars - tub/shower   Additional Comments: Resident of [place]      Prior Functioning/Environment Level of Independence: Independent with assistive device(s)        Comments: Mod indep with SPC for ADLs, household and community mobilization; walks daily; denies additional fall history. Pt indep with ADL, IADL, driving, and caregiver for wife (min assist for basic ADL and mobility)        OT Problem List: Decreased strength;Decreased coordination;Pain;Decreased range of motion;Increased edema;Impaired balance (sitting and/or standing);Decreased knowledge of use of DME or AE;Impaired UE functional  use      OT Treatment/Interventions: Self-care/ADL training;Therapeutic exercise;Therapeutic activities;DME and/or AE instruction;Patient/family  education;Balance training    OT Goals(Current goals can be found in the care plan section) Acute Rehab OT Goals Patient Stated Goal: get back home to care for my wife OT Goal Formulation: With patient Time For Goal Achievement: 10/23/19 Potential to Achieve Goals: Good ADL Goals Pt Will Perform Grooming: sitting;with set-up;with supervision Pt Will Perform Lower Body Dressing: sit to/from stand;with mod assist Pt Will Transfer to Toilet: bedside commode;with min assist;ambulating Additional ADL Goal #1: Pt will independently perform written HEP for RUE to improve strength/ROM/FMC for ADL tasks.  OT Frequency: Min 3X/week   Barriers to D/C:            Co-evaluation              AM-PAC OT "6 Clicks" Daily Activity     Outcome Measure Help from another person eating meals?: A Little Help from another person taking care of personal grooming?: A Little Help from another person toileting, which includes using toliet, bedpan, or urinal?: A Little Help from another person bathing (including washing, rinsing, drying)?: A Lot Help from another person to put on and taking off regular upper body clothing?: A Lot Help from another person to put on and taking off regular lower body clothing?: A Lot 6 Click Score: 15   End of Session Equipment Utilized During Treatment: Gait belt Nurse Communication: Other (comment)(O2 sats)  Activity Tolerance: Patient tolerated treatment well Patient left: in bed;with bed alarm set;with nursing/sitter in room(transport staff in to take pt to MRI)  OT Visit Diagnosis: Other abnormalities of gait and mobility (R26.89);History of falling (Z91.81);Pain Pain - Right/Left: Right Pain - part of body: Hand(cervical and lumbar spine)                Time: [id number] OT Time Calculation (min): 49 min Charges:  OT General Charges $OT Visit: 1 Visit OT Evaluation $OT Eval High Complexity: 1 High OT Treatments $Self Care/Home Management : 23-37  mins  Jeni Salles, MPH, MS, OTR/L ascom [phone] 10/09/19, 12:29 PM

## 2019-10-09 NOTE — Progress Notes (Signed)
Physical Therapy Treatment Patient Details Name: Dustin Harrison MRN: [id number] DOB: Jan 06, 1932 Today's Date: 10/09/2019    History of Present Illness presented to ER secondary to progressive neck/back pain and R UE weakness after fall 2 days prior to admission; admitted for management of AKI and bacteremia (unknown source at this time).  C-spine, T-spine and L-spine imaging significant for prominent C3-4 and C4-5 impingement, moderate C2-3 and L4-5 impingement and mild C5-6, C7-T1, L3-4 and L5-S1 impingement    PT Comments    Patient sleeping upon arrival, but easily awakens and agreeable to session.  Reports persistent R hand/wrist pain (xray negative), but notably improved with morphine.  Demonstrates improved LE strength and overall mobility performance this date, completing gait (80'x2) without assist device, min/mod assist.  Continues with very limited/no cervical/trunk rotation or arm swing with gait efforts; very limited balance reactions evident.  High fall risk evident; unsafe to attempt without +1 at all times. Patient/family pleased with progress and eager for continued participation with therapies.    Follow Up Recommendations  CIR     Equipment Recommendations       Recommendations for Other Services       Precautions / Restrictions Precautions Precautions: Fall Precaution Comments: NPO Restrictions Weight Bearing Restrictions: No    Mobility  Bed Mobility   Bed Mobility: Supine to Sit     Supine to sit: Mod assist Sit to supine: Min guard   General bed mobility comments: significant assist for truncal elevation; limited tolerance for cervical, thoracic or lumbar flexion, limited ability to dissociate trunk and extremities  Transfers Overall transfer level: Needs assistance   Transfers: Sit to/from Stand Sit to Stand: Min assist;Mod assist         General transfer comment: increased time/effort; limited forward trunk lean/lumbar extension with  movement transition  Ambulation/Gait Ambulation/Gait assistance: Min assist;Mod assist Gait Distance (Feet): (80' x2) Assistive device: None       General Gait Details: broad BOS with choppy stepping pattern; manual assist for L ant/lateral weight shift (though improved from previous date). Limited balance reactions evident, relying heavily on LE step strategy for balance recovery.  Absent cervical, trunk rotation and arm swing; very guarded posturing throughout gait cycle.   Stairs             Wheelchair Mobility    Modified Rankin (Stroke Patients Only)       Balance Overall balance assessment: Needs assistance Sitting-balance support: No upper extremity supported;Feet supported Sitting balance-Leahy Scale: Good     Standing balance support: No upper extremity supported Standing balance-Leahy Scale: Poor                              Cognition Arousal/Alertness: Awake/alert Behavior During Therapy: WFL for tasks assessed/performed Overall Cognitive Status: Within Functional Limits for tasks assessed                                        Exercises Other Exercises Other Exercises: Sit/stand x5 without UE support, mod progessing to min assist; does rely on L UE for assist with lift off; limited trunk flexion/lumbar extension noted despite cuing Other Exercises: Toilet transfer, ambulatory without assist device, min/mod assist; decreased balance with dynamic gait activities (turn negotiation, surface changes, obstacle negotiation).  Standing balance at sink for hand hygiene, min assist; limited functional reach, increased fall  risk, evident    General Comments        Pertinent Vitals/Pain Pain Assessment: Faces Faces Pain Scale: Hurts a little bit Pain Location: R hand Pain Descriptors / Indicators: Grimacing Pain Intervention(s): Limited activity within patient's tolerance;Monitored during session;Premedicated before  session;Repositioned    Home Living                      Prior Function            PT Goals (current goals can now be found in the care plan section) Acute Rehab PT Goals Patient Stated Goal: get back home to care for my wife PT Goal Formulation: With patient/family Time For Goal Achievement: 10/22/19 Potential to Achieve Goals: Good Progress towards PT goals: Progressing toward goals    Frequency    7X/week      PT Plan Current plan remains appropriate    Co-evaluation              AM-PAC PT "6 Clicks" Mobility   Outcome Measure  Help needed turning from your back to your side while in a flat bed without using bedrails?: A Little Help needed moving from lying on your back to sitting on the side of a flat bed without using bedrails?: A Lot Help needed moving to and from a bed to a chair (including a wheelchair)?: A Little Help needed standing up from a chair using your arms (e.g., wheelchair or bedside chair)?: A Little Help needed to walk in hospital room?: A Little Help needed climbing 3-5 steps with a railing? : A Lot 6 Click Score: 16    End of Session Equipment Utilized During Treatment: Gait belt Activity Tolerance: Patient tolerated treatment well Patient left: in bed;with call bell/phone within reach;with bed alarm set Nurse Communication: Mobility status PT Visit Diagnosis: Muscle weakness (generalized) (M62.81);Difficulty in walking, not elsewhere classified (R26.2);Hemiplegia and hemiparesis Hemiplegia - Right/Left: Right Hemiplegia - dominant/non-dominant: Dominant Hemiplegia - caused by: Unspecified     Time: 1410-1456 PT Time Calculation (min) (ACUTE ONLY): 46 min  Charges:  $Gait Training: 8-22 mins $Therapeutic Activity: 8-22 mins $Neuromuscular Re-education: 8-22 mins                     Nabeeha Badertscher H. Owens Shark, PT, DPT, NCS 10/09/19, 10:16 PM [phone]

## 2019-10-09 NOTE — Consult Note (Signed)
Reason for Consult: weakness  Requesting Physician: Dr.  Kurtis Bushman   CC: weakness.    HPI: Dustin Harrison is an 86 y.o. male  with medical history significant for hypertension, diabetes mellitus and chronic low back pain who presents to the emergency room for evaluation of worsening low back pain and generalized weakness.  Patient also complains of pain in his neck which is new.  Per EMS patient has had 2 falls in the past week  but patient is unable to recall the details of the fall, he was unable to state if he hit his head or his neck when he fell. He denies having any fever, cough, chest pain, shortness of breath, abdominal pain, nausea, vomiting, diarrhea or any urinary symptoms. He has been having progressive swallowing difficulty and significant findings on MRI C spine that could be associated with delayed swallowing.      Past Medical History:  Diagnosis Date  . Abnormal glucose   . Arthritis   . Baker's cyst of knee    left  . BPH (benign prostatic hypertrophy)   . Cough    because of "tight" esophagus  . Diabetes mellitus   . Glaucoma   . HOH (hard of hearing)   . Hypertension   . Pheochromocytoma of right adrenal gland   . Ulcer   . Wears hearing aid    bilateral    Past Surgical History:  Procedure Laterality Date  . adrenaletomy    . CATARACT EXTRACTION W/PHACO Left 10/08/2015   Procedure: CATARACT EXTRACTION PHACO AND INTRAOCULAR LENS PLACEMENT ([place]) left eye;  Surgeon: Leandrew Koyanagi, MD;  Location: [facility];  Service: Ophthalmology;  Laterality: Left;  MALYUGIN  . HERNIA REPAIR    . SQUAMOUS CELL CARCINOMA EXCISION  03-2006   left ear  . TONSILLECTOMY      Family History  Problem Relation Age of Onset  . Alcohol abuse Mother   . Coronary artery disease Mother   . Brain cancer Father        tumor  . Diabetes Brother     Social History:  reports that he has never smoked. He has never used smokeless tobacco. He reports current alcohol use  of about 7.0 standard drinks of alcohol per week. He reports that he does not use drugs.  No Known Allergies  Medications: I have reviewed the patient's current medications.  ROS: Not obtained as confused   Physical Examination: Blood pressure (!) 148/94, pulse 85, temperature 97.8 F (36.6 C), temperature source Oral, resp. rate 20, height 5\' 11"  (1.803 m), weight 87.1 kg, SpO2 97 %.    Neurological Examination   Mental Status: Alert to name . Cranial Nerves: II: Discs flat bilaterally; Visual fields grossly normal, pupils equal, round, reactive to light and accommodation III,IV, VI: ptosis not present, extra-ocular motions intact bilaterally V,VII: smile symmetric, facial light touch sensation normal bilaterally XI: bilateral shoulder shrug XII: midline tongue extension Motor: Generalized weakness bilaterally upper and lower extremities.   Tone and bulk:normal tone throughout; no atrophy noted Sensory: Pinprick and light touch intact throughout, bilaterally Deep Tendon Reflexes: 1+ and symmetric throughout Plantars: Right: downgoing   Left: downgoing Cerebellar: Not tested Gait: normal gait and station      Laboratory Studies:   Basic Metabolic Panel: Recent Labs  Lab 10/07/19 0727 10/08/19 0511 10/09/19 0033  NA 134* 135 135  K 5.0 4.2 4.3  CL 102 104 105  CO2 23 23 24   GLUCOSE 140* 148* 164*  BUN 66* 48* 42*  CREATININE 2.44* 1.61* 1.22  CALCIUM 8.6* 8.3* 8.0*    Liver Function Tests: No results for input(s): AST, ALT, ALKPHOS, BILITOT, PROT, ALBUMIN in the last 168 hours. No results for input(s): LIPASE, AMYLASE in the last 168 hours. No results for input(s): AMMONIA in the last 168 hours.  CBC: Recent Labs  Lab 10/07/19 0727 10/08/19 0511 10/09/19 0033  WBC 28.1* 23.5* 21.0*  NEUTROABS 24.3*  --   --   HGB 14.4 15.1 15.1  HCT 43.4 44.9 44.7  MCV 93.3 91.6 90.9  PLT 121* 99* 80*    Cardiac Enzymes: Recent Labs  Lab 10/07/19 0727   CKTOTAL 305    BNP: Invalid input(s): POCBNP  CBG: Recent Labs  Lab 10/08/19 0746 10/08/19 1152 10/08/19 1713 10/08/19 2124 10/09/19 0804  GLUCAP 164* 175* 140* 129* 142*    Microbiology: Results for orders placed or performed during the hospital encounter of 10/07/19  Urine culture     Status: None   Collection Time: 10/07/19  7:27 AM   Specimen: Urine, Random  Result Value Ref Range Status   Specimen Description   Final    URINE, RANDOM Performed at [facility], [address], [place], [place] [address]    Special Requests   Final    NONE Performed at [facility], [address] [place], [place] [address]    Culture   Final    NO GROWTH Performed at [facility], [place] [address] [place], [place] [address]    Report Status 10/08/2019 FINAL  Final  Blood Culture (routine x 2)     Status: Abnormal   Collection Time: 10/07/19  8:31 AM   Specimen: BLOOD  Result Value Ref Range Status   Specimen Description   Final    BLOOD LAC Performed at [facility], [address] [place], [place] [address]    Special Requests   Final    BOTTLES DRAWN AEROBIC AND ANAEROBIC Blood Culture adequate volume Performed at [facility], [address] [place], [place] [address]    Culture  Setup Time   Final    GRAM POSITIVE COCCI IN BOTH AEROBIC AND ANAEROBIC BOTTLES CRITICAL RESULT CALLED TO, READ BACK BY AND VERIFIED WITH: ASAJAH DUNCAN ON 10/07/2019 AT 2035 TIK Performed at [facility], [address] [place], [place] [address]    Culture (A)  Final    GROUP B STREP(S.AGALACTIAE)ISOLATED SUSCEPTIBILITIES PERFORMED ON PREVIOUS CULTURE WITHIN THE LAST 5 DAYS. Performed at [facility], [place] [address] [place], [place] [address]    Report Status 10/09/2019 FINAL  Final  Blood Culture (routine x 2)     Status: Abnormal   Collection Time: 10/07/19  8:31 AM   Specimen: BLOOD  Result Value Ref Range Status    Specimen Description   Final    BLOOD LWRIST Performed at [facility], [address] [place], [place] [address]    Special Requests   Final    BOTTLES DRAWN AEROBIC AND ANAEROBIC Blood Culture adequate volume Performed at [facility], [address] [place], [place] [address]    Culture  Setup Time   Final    GRAM POSITIVE COCCI IN BOTH AEROBIC AND ANAEROBIC BOTTLES CRITICAL RESULT CALLED TO, READ BACK BY AND VERIFIED WITH: ASAJAH DUNCAN ON 10/07/2019 AT 2035 TIK Performed at [facility], [place] [address] [place], [place] [address]    Culture GROUP B  STREP(S.AGALACTIAE)ISOLATED (A)  Final   Report Status 10/09/2019 FINAL  Final   Organism ID, Bacteria GROUP B STREP(S.AGALACTIAE)ISOLATED  Final      Susceptibility   Group b strep(s.agalactiae)isolated - MIC*    CLINDAMYCIN <=0.25 SENSITIVE Sensitive     AMPICILLIN <=0.25 SENSITIVE Sensitive     ERYTHROMYCIN <=0.12 SENSITIVE Sensitive     VANCOMYCIN 0.5 SENSITIVE Sensitive     CEFTRIAXONE <=0.12 SENSITIVE Sensitive     LEVOFLOXACIN 1 SENSITIVE Sensitive     PENICILLIN Value in next row Sensitive      SENSITIVE0.06    * GROUP B STREP(S.AGALACTIAE)ISOLATED  Blood Culture ID Panel (Reflexed)     Status: Abnormal   Collection Time: 10/07/19  8:31 AM  Result Value Ref Range Status   Enterococcus species NOT DETECTED NOT DETECTED Final   Listeria monocytogenes NOT DETECTED NOT DETECTED Final   Staphylococcus species NOT DETECTED NOT DETECTED Final   Staphylococcus aureus (BCID) NOT DETECTED NOT DETECTED Final   Streptococcus species DETECTED (A) NOT DETECTED Final    Comment: CRITICAL RESULT CALLED TO, READ BACK BY AND VERIFIED WITH: ASAJAH DUNCAN ON 10/07/2019 AT 2035 TIK    Streptococcus agalactiae DETECTED (A) NOT DETECTED Final    Comment: CRITICAL RESULT CALLED TO, READ BACK BY AND VERIFIED WITH: ASAJAH DUNCAN ON 10/07/2019 AT 2035 TIK    Streptococcus pneumoniae NOT DETECTED NOT DETECTED Final    Streptococcus pyogenes NOT DETECTED NOT DETECTED Final   Acinetobacter baumannii NOT DETECTED NOT DETECTED Final   Enterobacteriaceae species NOT DETECTED NOT DETECTED Final   Enterobacter cloacae complex NOT DETECTED NOT DETECTED Final   Escherichia coli NOT DETECTED NOT DETECTED Final   Klebsiella oxytoca NOT DETECTED NOT DETECTED Final   Klebsiella pneumoniae NOT DETECTED NOT DETECTED Final   Proteus species NOT DETECTED NOT DETECTED Final   Serratia marcescens NOT DETECTED NOT DETECTED Final   Haemophilus influenzae NOT DETECTED NOT DETECTED Final   Neisseria meningitidis NOT DETECTED NOT DETECTED Final   Pseudomonas aeruginosa NOT DETECTED NOT DETECTED Final   Candida albicans NOT DETECTED NOT DETECTED Final   Candida glabrata NOT DETECTED NOT DETECTED Final   Candida krusei NOT DETECTED NOT DETECTED Final   Candida parapsilosis NOT DETECTED NOT DETECTED Final   Candida tropicalis NOT DETECTED NOT DETECTED Final    Comment: Performed at [facility], [address] [place], [place] [address]  Respiratory Panel by RT PCR (Flu A&B, Covid) - Nasopharyngeal Swab     Status: None   Collection Time: 10/07/19 12:27 PM   Specimen: Nasopharyngeal Swab  Result Value Ref Range Status   SARS Coronavirus 2 by RT PCR NEGATIVE NEGATIVE Final    Comment: (NOTE) SARS-CoV-2 target nucleic acids are NOT DETECTED. The SARS-CoV-2 RNA is generally detectable in upper respiratoy specimens during the acute phase of infection. The lowest concentration of SARS-CoV-2 viral copies this assay can detect is 131 copies/mL. A negative result does not preclude SARS-Cov-2 infection and should not be used as the sole basis for treatment or other patient management decisions. A negative result may occur with  improper specimen collection/handling, submission of specimen other than nasopharyngeal swab, presence of viral mutation(s) within the areas targeted by this assay, and inadequate number of  viral copies (<131 copies/mL). A negative result must be combined with clinical observations, patient history, and epidemiological information. The expected result is Negative. Fact Sheet for Patients:  PinkCheek.be Fact Sheet for Healthcare Providers:  GravelBags.it This test is not yet ap proved or  cleared by the [place] and  has been authorized for detection and/or diagnosis of SARS-CoV-2 by FDA under an Emergency Use Authorization (EUA). This EUA will remain  in effect (meaning this test can be used) for the duration of the COVID-19 declaration under Section 564(b)(1) of the Act, 21 U.S.C. section 360bbb-3(b)(1), unless the authorization is terminated or revoked sooner.    Influenza A by PCR NEGATIVE NEGATIVE Final   Influenza B by PCR NEGATIVE NEGATIVE Final    Comment: (NOTE) The Xpert Xpress SARS-CoV-2/FLU/RSV assay is intended as an aid in  the diagnosis of influenza from Nasopharyngeal swab specimens and  should not be used as a sole basis for treatment. Nasal washings and  aspirates are unacceptable for Xpert Xpress SARS-CoV-2/FLU/RSV  testing. Fact Sheet for Patients: PinkCheek.be Fact Sheet for Healthcare Providers: GravelBags.it This test is not yet approved or cleared by the [place] FDA and  has been authorized for detection and/or diagnosis of SARS-CoV-2 by  FDA under an Emergency Use Authorization (EUA). This EUA will remain  in effect (meaning this test can be used) for the duration of the  Covid-19 declaration under Section 564(b)(1) of the Act, 21  U.S.C. section 360bbb-3(b)(1), unless the authorization is  terminated or revoked. Performed at [facility], [address] [place], [place] [address]   Culture, blood (Routine X 2) w Reflex to ID Panel     Status: None (Preliminary result)   Collection Time: 10/09/19 12:31  AM   Specimen: BLOOD LEFT WRIST  Result Value Ref Range Status   Specimen Description BLOOD LEFT WRIST  Final   Special Requests   Final    BOTTLES DRAWN AEROBIC AND ANAEROBIC Blood Culture adequate volume   Culture   Final    NO GROWTH < 12 HOURS Performed at [facility], [address] [place], [place] [address]    Report Status PENDING  Incomplete  Culture, blood (Routine X 2) w Reflex to ID Panel     Status: None (Preliminary result)   Collection Time: 10/09/19 12:33 AM   Specimen: BLOOD LEFT HAND  Result Value Ref Range Status   Specimen Description BLOOD LEFT HAND  Final   Special Requests   Final    BOTTLES DRAWN AEROBIC AND ANAEROBIC Blood Culture adequate volume   Culture   Final    NO GROWTH < 12 HOURS Performed at [facility] [address] [place], [place] [address]    Report Status PENDING  Incomplete    Coagulation Studies: No results for input(s): LABPROT, INR in the last 72 hours.  Urinalysis:  Recent Labs  Lab 10/07/19 0727  COLORURINE YELLOW*  LABSPEC 1.020  PHURINE 5.0  GLUCOSEU NEGATIVE  HGBUR MODERATE*  BILIRUBINUR NEGATIVE  KETONESUR NEGATIVE  PROTEINUR 100*  NITRITE NEGATIVE  LEUKOCYTESUR NEGATIVE    Lipid Panel:     Component Value Date/Time   CHOL 173 11/09/2018 0849   TRIG 47.0 11/09/2018 0849   HDL 86.10 11/09/2018 0849   CHOLHDL 2 11/09/2018 0849   VLDL 9.4 11/09/2018 0849   LDLCALC 77 11/09/2018 0849    HgbA1C:  Lab Results  Component Value Date   HGBA1C 6.8 (H) 10/07/2019    Urine Drug Screen:  No results found for: LABOPIA, COCAINSCRNUR, LABBENZ, AMPHETMU, THCU, LABBARB  Alcohol Level: No results for input(s): ETH in the last 168 hours.  Other results: EKG: normal EKG, normal sinus rhythm, unchanged from previous tracings.  Imaging: DG Wrist Complete Right  Result Date: 10/08/2019 CLINICAL DATA:  Right wrist pain. EXAM: RIGHT WRIST - COMPLETE 3+ VIEW COMPARISON:  None. FINDINGS: There is no  acute displaced fracture or dislocation. There are degenerative changes of the metacarpophalangeal joints. There are degenerative changes of the radiocarpal joint. There are degenerative changes of the scaphoid trapezial joint. IMPRESSION: Negative. Electronically Signed   By: Constance Holster M.D.   On: 10/08/2019 17:11   MR BRAIN WO CONTRAST  Result Date: 10/09/2019 CLINICAL DATA:  Right upper extremity weakness EXAM: MRI HEAD WITHOUT CONTRAST TECHNIQUE: Multiplanar, multiecho pulse sequences of the brain and surrounding structures were obtained without intravenous contrast. COMPARISON:  None. FINDINGS: There is substantial artifact related to left cochlear implant. Majority of the brain is obscured on diffusion weighted imaging. Susceptibility weighted imaging is nondiagnostic. Ventricles appear stable in size. Probable chronic microvascular ischemic changes. IMPRESSION: Substantial artifact related to cochlear implant. Acute infarction causing reported symptoms cannot be excluded. Electronically Signed   By: Macy Mis M.D.   On: 10/09/2019 11:39   ECHOCARDIOGRAM COMPLETE  Result Date: 10/08/2019    ECHOCARDIOGRAM REPORT   Patient Name:   YERAY BOLDON Date of Exam: 10/08/2019 Medical Rec #:  [id number]         Height:       71.0 in Accession #:    [id number]        Weight:       192.0 lb Date of Birth:  25-Jul-1931          BSA:          2.072 m Patient Age:    23 years          BP:           145/96 mmHg Patient Gender: M                 HR:           108 bpm. Exam Location:  [facility] Procedure: 2D Echo, Color Doppler and Cardiac Doppler Indications:     Bacteremia 790.7  History:         Patient has no prior history of Echocardiogram examinations.                  Risk Factors:Hypertension and Diabetes.  Sonographer:     Sherrie Sport RDCS (AE) Referring Phys:  [id number] [facility] Diagnosing Phys: Kathlyn Sacramento MD  Sonographer Comments: Suboptimal apical window. IMPRESSIONS  1. Left ventricular ejection  fraction, by estimation, is 25 to 30%. The left ventricle has severely decreased function. The left ventricle has no regional wall motion abnormalities. There is moderate left ventricular hypertrophy. Left ventricular diastolic parameters are consistent with Grade I diastolic dysfunction (impaired relaxation). There is severe akinesis of the left ventricular, mid-apical anteroseptal wall, anterolateral wall, anterior segment, apical segment and inferoseptal wall. Wall  motion abnormalities are suggestive of stress induced cardiomyopathy vs. LAD infarct.  2. Right ventricular systolic function is normal. The right ventricular size is normal. There is mildly elevated pulmonary artery systolic pressure.  3. Left atrial size was mildly dilated.  4. The mitral valve is normal in structure. No evidence of mitral valve regurgitation. No evidence of mitral stenosis.  5. The aortic valve is normal in structure. Aortic valve regurgitation is not visualized. Mild to moderate aortic valve sclerosis/calcification is present, without any evidence of aortic stenosis.  6. The inferior vena cava is normal in size with greater than 50% respiratory variability, suggesting right atrial pressure of 3  mmHg.  7. No clear vegetations but suboptimal study. FINDINGS  Left Ventricle: Left ventricular ejection fraction, by estimation, is 25 to 30%. The left ventricle has severely decreased function. The left ventricle has no regional wall motion abnormalities. Severe akinesis of the left ventricular, mid-apical anteroseptal wall, anterolateral wall, anterior segment, apical segment and inferoseptal wall. The left ventricular internal cavity size was normal in size. There is moderate left ventricular hypertrophy. Left ventricular diastolic parameters are consistent with Grade I diastolic dysfunction (impaired relaxation). Right Ventricle: The right ventricular size is normal. No increase in right ventricular wall thickness. Right ventricular  systolic function is normal. There is mildly elevated pulmonary artery systolic pressure. The tricuspid regurgitant velocity is 2.74  m/s, and with an assumed right atrial pressure of 10 mmHg, the estimated right ventricular systolic pressure is [id number] mmHg. Left Atrium: Left atrial size was mildly dilated. Right Atrium: Right atrial size was normal in size. Pericardium: There is no evidence of pericardial effusion. Mitral Valve: The mitral valve is normal in structure. Normal mobility of the mitral valve leaflets. No evidence of mitral valve regurgitation. No evidence of mitral valve stenosis. Tricuspid Valve: The tricuspid valve is normal in structure. Tricuspid valve regurgitation is mild . No evidence of tricuspid stenosis. Aortic Valve: The aortic valve is normal in structure. Aortic valve regurgitation is not visualized. Mild to moderate aortic valve sclerosis/calcification is present, without any evidence of aortic stenosis. Aortic valve mean gradient measures 3.0 mmHg. Aortic valve peak gradient measures 4.9 mmHg. Aortic valve area, by VTI measures 3.26 cm. Pulmonic Valve: The pulmonic valve was normal in structure. Pulmonic valve regurgitation is not visualized. No evidence of pulmonic stenosis. Aorta: The aortic root is normal in size and structure. Venous: The inferior vena cava is normal in size with greater than 50% respiratory variability, suggesting right atrial pressure of 3 mmHg. IAS/Shunts: No atrial level shunt detected by color flow Doppler.  LEFT VENTRICLE PLAX 2D LVIDd:         3.67 cm      Diastology LVIDs:         2.79 cm      LV e' lateral:   3.92 cm/s LV PW:         1.52 cm      LV E/e' lateral: 10.5 LV IVS:        1.45 cm      LV e' medial:    4.79 cm/s LVOT diam:     2.25 cm      LV E/e' medial:  8.6 LV SV:         53 LV SV Index:   26 LVOT Area:     3.98 cm  LV Volumes (MOD) LV vol d, MOD A2C: 124.0 ml LV vol d, MOD A4C: 65.1 ml LV vol s, MOD A2C: 83.1 ml LV vol s, MOD A4C: 61.5 ml LV SV  MOD A2C:     40.9 ml LV SV MOD A4C:     65.1 ml LV SV MOD BP:      25.6 ml RIGHT VENTRICLE RV Basal diam:  4.01 cm RV S prime:     12.90 cm/s TAPSE (M-mode): 3.1 cm LEFT ATRIUM             Index       RIGHT ATRIUM           Index LA diam:        3.20 cm 1.54 cm/m  RA Area:  13.30 cm LA Vol (A2C):   36.6 ml 17.66 ml/m RA Volume:   32.70 ml  15.78 ml/m LA Vol (A4C):   58.2 ml 28.08 ml/m LA Biplane Vol: 45.7 ml 22.05 ml/m  AORTIC VALVE                   PULMONIC VALVE AV Area (Vmax):    3.08 cm    PV Vmax:        0.48 m/s AV Area (Vmean):   3.20 cm    PV Peak grad:   0.9 mmHg AV Area (VTI):     3.26 cm    RVOT Peak grad: 1 mmHg AV Vmax:           110.50 cm/s AV Vmean:          84.500 cm/s AV VTI:            0.162 m AV Peak Grad:      4.9 mmHg AV Mean Grad:      3.0 mmHg LVOT Vmax:         85.50 cm/s LVOT Vmean:        68.100 cm/s LVOT VTI:          0.133 m LVOT/AV VTI ratio: 0.82  AORTA Ao Root diam: 3.20 cm MITRAL VALVE               TRICUSPID VALVE MV Area (PHT): 5.79 cm    TR Peak grad:   30.0 mmHg MV Decel Time: 131 msec    TR Vmax:        274.00 cm/s MV E velocity: 41.10 cm/s MV A velocity: 63.40 cm/s  SHUNTS MV E/A ratio:  0.65        Systemic VTI:  0.13 m                            Systemic Diam: 2.25 cm Kathlyn Sacramento MD Electronically signed by Kathlyn Sacramento MD Signature Date/Time: 10/08/2019/3:30:04 PM    Final      Assessment/Plan:  86 y.o. male  with medical history significant for hypertension, diabetes mellitus and chronic low back pain who presents to the emergency room for evaluation of worsening low back pain and generalized weakness.  Patient also complains of pain in his neck which is new.  Per EMS patient has had 2 falls in the past week  but patient is unable to recall the details of the fall, he was unable to state if he hit his head or his neck when he fell. He denies having any fever, cough, chest pain, shortness of breath, abdominal pain, nausea, vomiting, diarrhea or any  urinary symptoms. He has been having progressive swallowing difficulty and significant findings on MRI C spine that could be associated with delayed swallowing.    - MRI reviewed and there is good amount of motion artifact but I don't see anything new on there or acute - probably degenerative, progressive disease and deconditioning - I didn't appreciate any focal weakness on exam except generalized weakness - Neurosurgery out pt follow up for C spine even though I am not sure he is a good candidate.   10/09/2019, 11:56 AM

## 2019-10-09 NOTE — Consult Note (Signed)
Vonda Antigua, MD [address], [place], [place], [place], [address] [address], [place], [place], [place], [address] Phone: [phone]  Fax: [phone]  Consultation  Referring Provider:     Dr. Kurtis Bushman Primary Care Physician:  Jinny Sanders, MD Reason for Consultation:     Dysphagia  Date of Admission:  10/07/2019 Date of Consultation:  10/09/2019         HPI:   Dustin Harrison is a 86 y.o. male who is being evaluated with his family, daughter and wife present at bedside.  They report 10-year history of dysphagia and coughing with swallowing.  On this admission he was admitted for weakness and 2 falls in the last week.  He underwent evaluation by speech pathology which reports "moderate to severe pharyngeal esophageal phase dysphagia with both aspiration and laryngeal penetration noted".  I spoke with the speech pathologist, Orinda Kenner who states she did not see any reflux issues causing his dysphagia during the study but rather thinks there might be 2 separate issues going on including oropharyngeal dysphagia and possible issues with the upper esophageal sphincter.  Patient has had previous work-up for his symptoms including upper endoscopy in 2017 with Dr. Loletha Carrow that was normal.  MRI on this admission shows prominent impingement at C3-4 and C4-5 due to cervical spondylosis and degenerative disc disease  Past Medical History:  Diagnosis Date  . Abnormal glucose   . Arthritis   . Baker's cyst of knee    left  . BPH (benign prostatic hypertrophy)   . Cough    because of "tight" esophagus  . Diabetes mellitus   . Glaucoma   . HOH (hard of hearing)   . Hypertension   . Pheochromocytoma of right adrenal gland   . Ulcer   . Wears hearing aid    bilateral    Past Surgical History:  Procedure Laterality Date  . adrenaletomy    . CATARACT EXTRACTION W/PHACO Left 10/08/2015   Procedure: CATARACT EXTRACTION PHACO AND INTRAOCULAR LENS PLACEMENT ([facility]) left eye;   Surgeon: Leandrew Koyanagi, MD;  Location: [facility];  Service: Ophthalmology;  Laterality: Left;  MALYUGIN  . HERNIA REPAIR    . SQUAMOUS CELL CARCINOMA EXCISION  03-2006   left ear  . TONSILLECTOMY      Prior to Admission medications   Medication Sig Start Date End Date Taking? Authorizing Provider  atorvastatin (LIPITOR) 20 MG tablet TAKE ONE TABLET EVERY DAY 07/31/19  Yes Bedsole, Amy E, MD  finasteride (PROSCAR) 5 MG tablet TAKE ONE TABLET EVERY DAY 07/31/19  Yes Bedsole, Amy E, MD  losartan (COZAAR) 100 MG tablet TAKE ONE TABLET EVERY DAY 07/31/19  Yes Bedsole, Amy E, MD  Polyethyl Glycol-Propyl Glycol (SYSTANE) 0.4-0.3 % GEL ophthalmic gel Place 1 application into both eyes.   Yes [provider]  Psyllium (METAMUCIL FIBER PO) Take 2 Scoops by mouth daily.   Yes [provider]  tamsulosin (FLOMAX) 0.4 MG CAPS capsule TAKE 2 CAPSULES EVERY DAY 07/31/19  Yes Bedsole, Amy E, MD  timolol (TIMOPTIC) 0.5 % ophthalmic solution Place 1 drop into both eyes 2 (two) times daily.  10/30/11  Yes [provider]  acetaminophen (TYLENOL) 500 MG tablet Take 500 mg by mouth daily as needed.    [provider]    Family History  Problem Relation Age of Onset  . Alcohol abuse Mother   . Coronary artery disease Mother   . Brain cancer Father  tumor  . Diabetes Brother      Social History   Tobacco Use  . Smoking status: Never Smoker  . Smokeless tobacco: Never Used  Substance Use Topics  . Alcohol use: Yes    Alcohol/week: 7.0 standard drinks    Types: 7 Glasses of wine per week  . Drug use: No    Allergies as of 10/07/2019  . (No Known Allergies)    Review of Systems:    All systems reviewed and negative except where noted in HPI.   Physical Exam:  Vital signs in last 24 hours: Vitals:   10/08/19 2001 10/08/19 2002 10/09/19 0315 10/09/19 1211  BP: (!) 145/100 134/88 (!) 148/94 (!) 151/96  Pulse: 98 94 85 84  Resp: 20  20 16     Temp: 98.4 F (36.9 C)  97.8 F (36.6 C) 98.3 F (36.8 C)  TempSrc: Oral  Oral   SpO2: 97% 93% 97% 99%  Weight:      Height:       Last BM Date: 10/08/19 General:   Pleasant, cooperative in NAD Head:  Normocephalic and atraumatic. Eyes:   No icterus.   Conjunctiva pink. PERRLA. Ears:  Normal auditory acuity. Neck:  Supple; no masses or thyroidomegaly Lungs: Respirations even and unlabored. Lungs clear to auscultation bilaterally.   No wheezes, crackles, or rhonchi.  Abdomen:  Soft, nondistended, nontender. Normal bowel sounds. No appreciable masses or hepatomegaly.  No rebound or guarding.  Neurologic:  Alert and oriented x3;  grossly normal neurologically. Skin:  Intact without significant lesions or rashes. Cervical Nodes:  No significant cervical adenopathy. Psych:  Alert and cooperative. Normal affect.  LAB RESULTS: Recent Labs    10/07/19 0727 10/08/19 0511 10/09/19 0033  WBC 28.1* 23.5* 21.0*  HGB 14.4 15.1 15.1  HCT 43.4 44.9 44.7  PLT 121* 99* 80*   BMET Recent Labs    10/07/19 0727 10/08/19 0511 10/09/19 0033  NA 134* 135 135  K 5.0 4.2 4.3  CL 102 104 105  CO2 23 23 24   GLUCOSE 140* 148* 164*  BUN 66* 48* 42*  CREATININE 2.44* 1.61* 1.22  CALCIUM 8.6* 8.3* 8.0*   LFT No results for input(s): PROT, ALBUMIN, AST, ALT, ALKPHOS, BILITOT, BILIDIR, IBILI in the last 72 hours. PT/INR No results for input(s): LABPROT, INR in the last 72 hours.  STUDIES: DG Wrist Complete Right  Result Date: 10/08/2019 CLINICAL DATA:  Right wrist pain. EXAM: RIGHT WRIST - COMPLETE 3+ VIEW COMPARISON:  None. FINDINGS: There is no acute displaced fracture or dislocation. There are degenerative changes of the metacarpophalangeal joints. There are degenerative changes of the radiocarpal joint. There are degenerative changes of the scaphoid trapezial joint. IMPRESSION: Negative. Electronically Signed   By: Constance Holster M.D.   On: 10/08/2019 17:11   MR BRAIN WO  CONTRAST  Result Date: 10/09/2019 CLINICAL DATA:  Right upper extremity weakness EXAM: MRI HEAD WITHOUT CONTRAST TECHNIQUE: Multiplanar, multiecho pulse sequences of the brain and surrounding structures were obtained without intravenous contrast. COMPARISON:  None. FINDINGS: There is substantial artifact related to left cochlear implant. Majority of the brain is obscured on diffusion weighted imaging. Susceptibility weighted imaging is nondiagnostic. Ventricles appear stable in size. Probable chronic microvascular ischemic changes. IMPRESSION: Substantial artifact related to cochlear implant. Acute infarction causing reported symptoms cannot be excluded. Electronically Signed   By: Macy Mis M.D.   On: 10/09/2019 11:39   ECHOCARDIOGRAM COMPLETE  Result Date: 10/08/2019    ECHOCARDIOGRAM REPORT  Patient Name:   Dustin Harrison Date of Exam: 10/08/2019 Medical Rec #:  [id number]         Height:       71.0 in Accession #:    [id number]        Weight:       192.0 lb Date of Birth:  [birth year]          BSA:          2.072 m Patient Age:    57 years          BP:           145/96 mmHg Patient Gender: M                 HR:           108 bpm. Exam Location:  [facility] Procedure: 2D Echo, Color Doppler and Cardiac Doppler Indications:     Bacteremia 790.7  History:         Patient has no prior history of Echocardiogram examinations.                  Risk Factors:Hypertension and Diabetes.  Sonographer:     Sherrie Sport RDCS (AE) Referring Phys:  [id number] [facility] Diagnosing Phys: Kathlyn Sacramento MD  Sonographer Comments: Suboptimal apical window. IMPRESSIONS  1. Left ventricular ejection fraction, by estimation, is 25 to 30%. The left ventricle has severely decreased function. The left ventricle has no regional wall motion abnormalities. There is moderate left ventricular hypertrophy. Left ventricular diastolic parameters are consistent with Grade I diastolic dysfunction (impaired relaxation). There is severe akinesis of  the left ventricular, mid-apical anteroseptal wall, anterolateral wall, anterior segment, apical segment and inferoseptal wall. Wall  motion abnormalities are suggestive of stress induced cardiomyopathy vs. LAD infarct.  2. Right ventricular systolic function is normal. The right ventricular size is normal. There is mildly elevated pulmonary artery systolic pressure.  3. Left atrial size was mildly dilated.  4. The mitral valve is normal in structure. No evidence of mitral valve regurgitation. No evidence of mitral stenosis.  5. The aortic valve is normal in structure. Aortic valve regurgitation is not visualized. Mild to moderate aortic valve sclerosis/calcification is present, without any evidence of aortic stenosis.  6. The inferior vena cava is normal in size with greater than 50% respiratory variability, suggesting right atrial pressure of 3 mmHg.  7. No clear vegetations but suboptimal study. FINDINGS  Left Ventricle: Left ventricular ejection fraction, by estimation, is 25 to 30%. The left ventricle has severely decreased function. The left ventricle has no regional wall motion abnormalities. Severe akinesis of the left ventricular, mid-apical anteroseptal wall, anterolateral wall, anterior segment, apical segment and inferoseptal wall. The left ventricular internal cavity size was normal in size. There is moderate left ventricular hypertrophy. Left ventricular diastolic parameters are consistent with Grade I diastolic dysfunction (impaired relaxation). Right Ventricle: The right ventricular size is normal. No increase in right ventricular wall thickness. Right ventricular systolic function is normal. There is mildly elevated pulmonary artery systolic pressure. The tricuspid regurgitant velocity is 2.74  m/s, and with an assumed right atrial pressure of 10 mmHg, the estimated right ventricular systolic pressure is [id number] mmHg. Left Atrium: Left atrial size was mildly dilated. Right Atrium: Right atrial size was  normal in size. Pericardium: There is no evidence of pericardial effusion. Mitral Valve: The mitral valve is normal in structure. Normal mobility of the mitral valve leaflets. No evidence of mitral valve regurgitation.  No evidence of mitral valve stenosis. Tricuspid Valve: The tricuspid valve is normal in structure. Tricuspid valve regurgitation is mild . No evidence of tricuspid stenosis. Aortic Valve: The aortic valve is normal in structure. Aortic valve regurgitation is not visualized. Mild to moderate aortic valve sclerosis/calcification is present, without any evidence of aortic stenosis. Aortic valve mean gradient measures 3.0 mmHg. Aortic valve peak gradient measures 4.9 mmHg. Aortic valve area, by VTI measures 3.26 cm. Pulmonic Valve: The pulmonic valve was normal in structure. Pulmonic valve regurgitation is not visualized. No evidence of pulmonic stenosis. Aorta: The aortic root is normal in size and structure. Venous: The inferior vena cava is normal in size with greater than 50% respiratory variability, suggesting right atrial pressure of 3 mmHg. IAS/Shunts: No atrial level shunt detected by color flow Doppler.  LEFT VENTRICLE PLAX 2D LVIDd:         3.67 cm      Diastology LVIDs:         2.79 cm      LV e' lateral:   3.92 cm/s LV PW:         1.52 cm      LV E/e' lateral: 10.5 LV IVS:        1.45 cm      LV e' medial:    4.79 cm/s LVOT diam:     2.25 cm      LV E/e' medial:  8.6 LV SV:         53 LV SV Index:   26 LVOT Area:     3.98 cm  LV Volumes (MOD) LV vol d, MOD A2C: 124.0 ml LV vol d, MOD A4C: 65.1 ml LV vol s, MOD A2C: 83.1 ml LV vol s, MOD A4C: 61.5 ml LV SV MOD A2C:     40.9 ml LV SV MOD A4C:     65.1 ml LV SV MOD BP:      25.6 ml RIGHT VENTRICLE RV Basal diam:  4.01 cm RV S prime:     12.90 cm/s TAPSE (M-mode): 3.1 cm LEFT ATRIUM             Index       RIGHT ATRIUM           Index LA diam:        3.20 cm 1.54 cm/m  RA Area:     13.30 cm LA Vol (A2C):   36.6 ml 17.66 ml/m RA Volume:    32.70 ml  15.78 ml/m LA Vol (A4C):   58.2 ml 28.08 ml/m LA Biplane Vol: 45.7 ml 22.05 ml/m  AORTIC VALVE                   PULMONIC VALVE AV Area (Vmax):    3.08 cm    PV Vmax:        0.48 m/s AV Area (Vmean):   3.20 cm    PV Peak grad:   0.9 mmHg AV Area (VTI):     3.26 cm    RVOT Peak grad: 1 mmHg AV Vmax:           110.50 cm/s AV Vmean:          84.500 cm/s AV VTI:            0.162 m AV Peak Grad:      4.9 mmHg AV Mean Grad:      3.0 mmHg LVOT Vmax:         85.50 cm/s  LVOT Vmean:        68.100 cm/s LVOT VTI:          0.133 m LVOT/AV VTI ratio: 0.82  AORTA Ao Root diam: 3.20 cm MITRAL VALVE               TRICUSPID VALVE MV Area (PHT): 5.79 cm    TR Peak grad:   30.0 mmHg MV Decel Time: 131 msec    TR Vmax:        274.00 cm/s MV E velocity: 41.10 cm/s MV A velocity: 63.40 cm/s  SHUNTS MV E/A ratio:  0.65        Systemic VTI:  0.13 m                            Systemic Diam: 2.25 cm Kathlyn Sacramento MD Electronically signed by Kathlyn Sacramento MD Signature Date/Time: 10/08/2019/3:30:04 PM    Final       Impression / Plan:   Dustin Harrison is a 86 y.o. y/o male with dysphagia  Previous upper endoscopy for his ongoing symptoms that are chronic for over 10 years did not reveal any esophageal findings.  I spoke with speech pathologist and patient does have moderate to severe pharyngeal phase dysphagia as per their note.  The speech pathologist questions if the cervical prominence could be causing UES involvement and symptoms.  I would recommend further evaluation for this with ENT.  They can consider laryngoscopy or other imaging if needed  No indication for upper endoscopy as his symptoms were already assessed with an upper endoscopy in 2017 and risks of procedures would outweigh any benefits at this time  Family asked about feeding tube for nutrition and we discussed risks and benefits of endoscopically placed feeding tubes in the setting.  They would like to discuss it amongst themselves, but do not  want to have the patient undergo procedures requiring sedation and would prefer to have a feeding tube placed with interventional radiology if patient is unable to maintain oral nutrition, in order to avoid sedation.  Primary team to discuss patient with ENT to see if evaluation of upper esophageal sphincter/cervical impingement is needed with laryngoscopy, and then subsequently consider IR referral for feeding tube placement if patient unable to maintain oral nutrition  GI service will sign off at this time.  Please call with any questions or concerns  Thank you for involving me in the care of this patient.      LOS: 2 days   Virgel Manifold, MD  10/09/2019, 2:34 PM

## 2019-10-09 NOTE — Progress Notes (Signed)
Patient's BP and HR elevated, currently NPO, RN informed NP Rufina Falco and requested directives if to give PO medication.

## 2019-10-09 NOTE — Consult Note (Signed)
Cardiology Consultation:   Patient ID: Dustin Harrison; [id number]; [birth year]   Admit date: 10/07/2019 Date of Consult: 10/09/2019  Primary Care Provider: Jinny Sanders, MD Primary Cardiologist: new to [facility] - consult by End   Patient Profile:   Dustin Harrison is a 86 y.o. male with a hx of pheochromocytoma, right adrenal tumor status post right adrenalectomy in 2006, cochlear implant on the left side in 06/2018, diabetes not on medications, and chronic back pain for 20 years who is being seen today for the evaluation of acute systolic CHF and group B streptococcus bacteremia at the request of Dr. Kurtis Bushman.  History of Present Illness:   Mr. Hurney has no previously known cardiac history though does report he has previously undergone a treadmill stress test with Dr. Rockey Situ in the remote past with details of this being unclear.  He tells [place] at baseline he has been very active and typically walks on a daily basis with his wife along with playing golf regularly.  Notes indicate he ambulates with a cane on a daily basis.  He reports he was in his usual state of health until 4/30 when he started having diffuse upper and lower extremity tremors and chills.  He was having a bowel movement and went to stand up, losing his balance and falling to the floor for several hours.  He reports he does not think he hit his head or suffer LOC though he is not certain.  Due to worsening neck pain and swelling involving the right hand/wrist he was brought to the hospital on 5/2 where it was noted he had dysphagia with concern for aspiration.  CT head was nonacute.  CT cervical spine nonacute.  Chest x-ray nonacute.  Plain film of the lumbar spine nonacute.  MRI of the cervical/thoracic/lumbar spine showed lumbar spondylosis with degenerative disc disease causing a moderate impingement at L4-5 and mild impingement at L3-4 and L5-S1.  Right wrist plain film negative.  MRI brain pending.  Blood culture has grown  group B strep in 4 of 4 bottles with source being uncertain at this time.  Covid negative.  CK normal at 305.  BUN 66, serum creatinine 2.44 trending to 42/1.22 respectively this morning.  Leukocytosis of 28,000 upon admission.  Surface echo for bacteremia showed an EF of 25 to 30% (no previous study available for review), grade 1 diastolic dysfunction, severe akinesis of the left ventricular mid apical anteroseptal wall, anterolateral wall, anterior segment, apical segment, and inferoseptal wall with images suggestive of stress-induced cardiomyopathy versus LAD infarct, normal RV systolic function and ventricular cavity size, mildly dilated left atrium, mild to moderate aortic valve sclerosis without evidence of stenosis, and no clear vegetations on the surface study though this was suboptimal.  In this setting, cardiology was consulted to evaluate patient for TEE and for acute systolic CHF.  The patient denies any lower extremity swelling, abdominal distention, orthopnea, PND, or early satiety.  No dyspnea, chest pain, or palpitations.  He continues to note significant back and neck pain as well as bilateral upper extremity weakness with continued soft tissue swelling involving the right wrist.    Past Medical History:  Diagnosis Date  . Abnormal glucose   . Arthritis   . Baker's cyst of knee    left  . BPH (benign prostatic hypertrophy)   . Cough    because of "tight" esophagus  . Diabetes mellitus   . Glaucoma   . HOH (hard of hearing)   .  Hypertension   . Pheochromocytoma of right adrenal gland   . Ulcer   . Wears hearing aid    bilateral    Past Surgical History:  Procedure Laterality Date  . adrenaletomy    . CATARACT EXTRACTION W/PHACO Left 10/08/2015   Procedure: CATARACT EXTRACTION PHACO AND INTRAOCULAR LENS PLACEMENT ([place]) left eye;  Surgeon: Leandrew Koyanagi, MD;  Location: [place];  Service: Ophthalmology;  Laterality: Left;  MALYUGIN  . HERNIA REPAIR    .  SQUAMOUS CELL CARCINOMA EXCISION  03-2006   left ear  . TONSILLECTOMY       Home Meds: Prior to Admission medications   Medication Sig Start Date End Date Taking? Authorizing Provider  atorvastatin (LIPITOR) 20 MG tablet TAKE ONE TABLET EVERY DAY 07/31/19  Yes Bedsole, Amy E, MD  finasteride (PROSCAR) 5 MG tablet TAKE ONE TABLET EVERY DAY 07/31/19  Yes Bedsole, Amy E, MD  losartan (COZAAR) 100 MG tablet TAKE ONE TABLET EVERY DAY 07/31/19  Yes Bedsole, Amy E, MD  Polyethyl Glycol-Propyl Glycol (SYSTANE) 0.4-0.3 % GEL ophthalmic gel Place 1 application into both eyes.   Yes [provider]  Psyllium (METAMUCIL FIBER PO) Take 2 Scoops by mouth daily.   Yes [provider]  tamsulosin (FLOMAX) 0.4 MG CAPS capsule TAKE 2 CAPSULES EVERY DAY 07/31/19  Yes Bedsole, Amy E, MD  timolol (TIMOPTIC) 0.5 % ophthalmic solution Place 1 drop into both eyes 2 (two) times daily.  10/30/11  Yes [provider]  acetaminophen (TYLENOL) 500 MG tablet Take 500 mg by mouth daily as needed.    [provider]    Inpatient Medications: Scheduled Meds: . amLODipine  10 mg Oral Daily  . cyclobenzaprine  5 mg Oral TID  . finasteride  5 mg Oral Daily  . insulin aspart  0-15 Units Subcutaneous TID WC  . insulin aspart  4 Units Subcutaneous TID WC  . lidocaine  1 patch Transdermal Q24H  . psyllium  1 packet Oral Daily  . tamsulosin  0.8 mg Oral Daily  . timolol  1 drop Both Eyes BID   Continuous Infusions: . penicillin g continuous IV infusion 12 Million Units (10/09/19 0842)   PRN Meds: HYDROcodone-acetaminophen, labetalol, morphine injection, ondansetron **OR** ondansetron (ZOFRAN) IV  Allergies:  No Known Allergies  Social History:   Social History   Socioeconomic History  . Marital status: Married    Spouse name: Not on file  . Number of children: Not on file  . Years of education: Not on file  . Highest education level: Not on file  Occupational History  .  Occupation: chaplain  Tobacco Use  . Smoking status: Never Smoker  . Smokeless tobacco: Never Used  Substance and Sexual Activity  . Alcohol use: Yes    Alcohol/week: 7.0 standard drinks    Types: 7 Glasses of wine per week  . Drug use: No  . Sexual activity: Not Currently  Other Topics Concern  . Not on file  Social History Narrative   Regular exercise- yes- 3-4 times a week treadmill/walk   Diet: fruits and veggies,water   Living will, HCPOA (wife) , full code (reviewed 76)   Social Determinants of Health   Financial Resource Strain:   . Difficulty of Paying Living Expenses:   Food Insecurity:   . Worried About Charity fundraiser in the Last Year:   . Arboriculturist in the Last Year:   Transportation Needs:   . Lack of Transportation (  Medical):   Marland Kitchen Lack of Transportation (Non-Medical):   Physical Activity:   . Days of Exercise per Week:   . Minutes of Exercise per Session:   Stress:   . Feeling of Stress :   Social Connections:   . Frequency of Communication with Friends and Family:   . Frequency of Social Gatherings with Friends and Family:   . Attends Religious Services:   . Active Member of Clubs or Organizations:   . Attends Archivist Meetings:   Marland Kitchen Marital Status:   Intimate Partner Violence:   . Fear of Current or Ex-Partner:   . Emotionally Abused:   Marland Kitchen Physically Abused:   . Sexually Abused:      Family History:   Family History  Problem Relation Age of Onset  . Alcohol abuse Mother   . Coronary artery disease Mother   . Brain cancer Father        tumor  . Diabetes Brother     ROS:  Review of Systems  Constitutional: Positive for malaise/fatigue. Negative for chills, diaphoresis, fever and weight loss.  HENT: Positive for hearing loss. Negative for congestion.   Eyes: Negative for discharge and redness.  Respiratory: Negative for cough, sputum production, shortness of breath and wheezing.   Cardiovascular: Negative for chest pain,  palpitations, orthopnea, claudication, leg swelling and PND.  Gastrointestinal: Negative for abdominal pain, heartburn, nausea and vomiting.  Musculoskeletal: Positive for back pain, falls, joint pain, myalgias and neck pain.  Skin: Negative for rash.  Neurological: Positive for tremors and weakness. Negative for dizziness, tingling, sensory change, speech change, focal weakness and loss of consciousness.  Endo/Heme/Allergies: Does not bruise/bleed easily.  Psychiatric/Behavioral: Negative for substance abuse. The patient is not nervous/anxious.   All other systems reviewed and are negative.     Physical Exam/Data:   Vitals:   10/08/19 2000 10/08/19 2001 10/08/19 2002 10/09/19 0315  BP:  (!) 145/100 134/88 (!) 148/94  Pulse:  98 94 85  Resp:  20  20  Temp:  98.4 F (36.9 C)  97.8 F (36.6 C)  TempSrc:  Oral  Oral  SpO2: 93% 97% 93% 97%  Weight:      Height:        Intake/Output Summary (Last 24 hours) at 10/09/2019 1132 Last data filed at 10/09/2019 0842 Gross per 24 hour  Intake 561.75 ml  Output 1650 ml  Net -1088.25 ml   Filed Weights   10/07/19 0338  Weight: 87.1 kg   Body mass index is 26.78 kg/m.   Physical Exam: General: Well developed, well nourished, in no acute distress. Head: Normocephalic, atraumatic, sclera non-icteric, no xanthomas, nares without discharge.  Hard of hearing with cochlear implant in place.  Neck: Negative for carotid bruits. JVD not elevated. Lungs: Clear bilaterally to auscultation without wheezes, rales, or rhonchi. Breathing is unlabored. Heart: RRR with S1 S2. No murmurs, rubs, or gallops appreciated. Abdomen: Soft, non-tender, non-distended with normoactive bowel sounds. No hepatomegaly. No rebound/guarding. No obvious abdominal masses. Msk:  Strength and tone appear normal for age.  Soft tissue swelling noted of the right wrist with significant tenderness to palpation. Extremities: No clubbing or cyanosis. No edema. Distal pedal pulses  are 2+ and equal bilaterally. Neuro: Alert and oriented X 3. No facial asymmetry. No focal deficit. Moves all extremities spontaneously. Psych:  Responds to questions appropriately with a normal affect.   EKG:  The EKG was personally reviewed and demonstrates: NSR, 78 bpm, rare PAC, no acute ST-T  changes Telemetry:  Telemetry was personally reviewed and demonstrates: Not on telemetry  Weights: Filed Weights   10/07/19 0338  Weight: 87.1 kg    Relevant CV Studies:  2D echo 10/08/2019: 1. Left ventricular ejection fraction, by estimation, is 25 to 30%. The  left ventricle has severely decreased function. The left ventricle has no  regional wall motion abnormalities. There is moderate left ventricular  hypertrophy. Left ventricular  diastolic parameters are consistent with Grade I diastolic dysfunction  (impaired relaxation). There is severe akinesis of the left ventricular,  mid-apical anteroseptal wall, anterolateral wall, anterior segment, apical  segment and inferoseptal wall. Wall  motion abnormalities are suggestive of stress induced cardiomyopathy vs.  LAD infarct.  2. Right ventricular systolic function is normal. The right ventricular  size is normal. There is mildly elevated pulmonary artery systolic  pressure.  3. Left atrial size was mildly dilated.  4. The mitral valve is normal in structure. No evidence of mitral valve  regurgitation. No evidence of mitral stenosis.  5. The aortic valve is normal in structure. Aortic valve regurgitation is  not visualized. Mild to moderate aortic valve sclerosis/calcification is  present, without any evidence of aortic stenosis.  6. The inferior vena cava is normal in size with greater than 50%  respiratory variability, suggesting right atrial pressure of 3 mmHg.  7. No clear vegetations but suboptimal study.  Laboratory Data:  Chemistry Recent Labs  Lab 10/07/19 0727 10/08/19 0511 10/09/19 0033  NA 134* 135 135  K 5.0  4.2 4.3  CL 102 104 105  CO2 23 23 24   GLUCOSE 140* 148* 164*  BUN 66* 48* 42*  CREATININE 2.44* 1.61* 1.22  CALCIUM 8.6* 8.3* 8.0*  GFRNONAA 23* 38* 54*  GFRAA 27* 45* >60  ANIONGAP 9 8 6     No results for input(s): PROT, ALBUMIN, AST, ALT, ALKPHOS, BILITOT in the last 168 hours. Hematology Recent Labs  Lab 10/07/19 0727 10/08/19 0511 10/09/19 0033  WBC 28.1* 23.5* 21.0*  RBC 4.65 4.90 4.92  HGB 14.4 15.1 15.1  HCT 43.4 44.9 44.7  MCV 93.3 91.6 90.9  MCH 31.0 30.8 30.7  MCHC 33.2 33.6 33.8  RDW 13.2 13.2 13.1  PLT 121* 99* 80*   Cardiac EnzymesNo results for input(s): TROPONINI in the last 168 hours. No results for input(s): TROPIPOC in the last 168 hours.  BNPNo results for input(s): BNP, PROBNP in the last 168 hours.  DDimer No results for input(s): DDIMER in the last 168 hours.  Radiology/Studies:  DG Chest 2 View  Result Date: 10/07/2019 IMPRESSION: No acute findings. Electronically Signed   By: Marin Olp M.D.   On: 10/07/2019 07:53   DG Lumbar Spine 2-3 Views  Result Date: 10/07/2019 IMPRESSION: 1.  No acute findings. 2. Moderate spondylosis of the lumbar spine with mild multilevel disc disease. Subtle stable grade 1 anterolisthesis of L4 on L5 due to facet arthropathy. Electronically Signed   By: Marin Olp M.D.   On: 10/07/2019 07:56   DG Wrist Complete Right  Result Date: 10/08/2019 IMPRESSION: Negative. Electronically Signed   By: Constance Holster M.D.   On: 10/08/2019 17:11   CT Head Wo Contrast  Result Date: 10/07/2019 IMPRESSION: No evidence of acute intracranial injury. Electronically Signed   By: Macy Mis M.D.   On: 10/07/2019 07:57   CT Cervical Spine Wo Contrast  Result Date: 10/07/2019 IMPRESSION: No acute cervical spine fracture. Electronically Signed   By: Macy Mis M.D.   On:  10/07/2019 08:01   MR Cervical Spine Wo Contrast  Result Date: 10/07/2019 IMPRESSION: 1. Cervical spondylosis and degenerative disc disease, causing  prominent impingement at C3-4 and C4-5; moderate impingement at C2-3; and mild impingement at C5-6 and C7-T1, as detailed above. Electronically Signed   By: Van Clines M.D.   On: 10/07/2019 10:00   MR THORACIC SPINE WO CONTRAST  Result Date: 10/07/2019 IMPRESSION: 1. Thoracic spondylosis and degenerative disc disease without observed impingement. 2. Equivocal mild interstitial accentuation in the lungs although not as readily apparent on the dedicated chest radio Electronically Signed   By: Van Clines M.D.   On: 10/07/2019 10:06   MR LUMBAR SPINE WO CONTRAST  Result Date: 10/07/2019 IMPRESSION: 1. Lumbar spondylosis and degenerative disc disease, causing moderate impingement at L4-5 and mild impingement at L3-4 and L5-S1. 2. Cortical thinning of the right kidney lower pole suggesting right renal atrophy. Electronically Signed   By: Van Clines M.D.   On: 10/07/2019 09:51    Assessment and Plan:   1.  Acute HFrEF: -He appears euvolemic and well compensated -Echo images concerning for stress-induced cardiomyopathy versus LAD territory infarct -Escalate GDMT as tolerated with discontinuation of amlodipine given his cardiomyopathy and initiation of carvedilol 3.25 mg twice daily with titration as indicated -We will hold off on initiating ACE inhibitor/ARB/Entresto/spironolactone at this time given his recent AKI with recommendation to escalate these as tolerated prior to discharge or in follow-up -Ideally, he would benefit from diagnostic [facility] given his new cardiomyopathy however this will be deferred at this time secondary to his continued downtrending platelet count of uncertain etiology as he has not been on heparin as well as in the setting of his recent AKI and mechanical fall of uncertain etiology -Ischemic evaluation will need to be considered prior to discharge  2.  Group B streptococcus bacteremia: -No clear vegetations on 2D surface echo, though this was overall a  suboptimal study -Tentatively planning for TEE on 10/10/2019 pending the patient's thrombocytopenia and neck pain -Case request and orders have been placed for this study -Antibiotics per IM -After careful review of history and examination, the risks and benefits of transesophageal echocardiogram have been explained including risks of esophageal damage, perforation (1:10,000 risk), bleeding, pharyngeal hematoma as well as other potential complications associated with conscious sedation including aspiration, arrhythmia, respiratory failure and death. Alternatives to treatment were discussed, questions were answered. Patient is willing to proceed  3.  AKI: -Improved -CK not consistent with rhabdo -Possibly dehydration in etiology -Losartan held  4.  Thrombocytopenia: -Platelet count continues to downtrend from a value greater than 200 one year prior to a current value of 80 -Uncertain etiology -This will be trended prior to his potential TEE tomorrow morning -Per IM  5.  Neck/back pain/fall: -Patient indicates he was walking and playing golf on a regular basis prior to his admission 2 days ago -His functional status at baseline remains uncertain -Work-up per IM -The circumstances surrounding his fall remain somewhat uncertain though labs certainly indicate he may have been dehydrated -Cannot exclude a syncopal episode, recommend placing patient on telemetry   For questions or updates, please contact [place] Please consult [email or web address] for contact info under Cardiology/STEMI.   Signed, Christell Faith, PA-C [place] Pager: [phone] 10/09/2019, 11:32 AM

## 2019-10-09 NOTE — Progress Notes (Signed)
Rehab Admissions Coordinator Note:  Per PT and OT recommendation, this patient was screened by Raechel Ache for appropriateness for an Inpatient Acute Rehab Consult.  At this time, we are recommending Inpatient Rehab consult. AC will place consult order in chart per protocol.   Raechel Ache 10/09/2019, 1:00 PM  I can be reached at [phone].

## 2019-10-09 NOTE — Progress Notes (Signed)
PROGRESS NOTE    Dustin Harrison  [id number] DOB: 1931/07/30 DOA: 10/07/2019 PCP: Jinny Sanders, MD    Brief Narrative:  Dustin Harrison is a 86 y.o. male with medical history significant for hypertension, diabetes mellitus and chronic low back pain who presents to the emergency room for evaluation of worsening low back pain and generalized weakness.      Consultants:   GI, neurology  Procedures: MRI, CT  Antimicrobials:   Pen G   Subjective: Has no new complaints. Denies sob, cp, dizziness  Objective: Vitals:   10/08/19 2001 10/08/19 2002 10/09/19 0315 10/09/19 1211  BP: (!) 145/100 134/88 (!) 148/94 (!) 151/96  Pulse: 98 94 85 84  Resp: 20  20 16   Temp: 98.4 F (36.9 C)  97.8 F (36.6 C) 98.3 F (36.8 C)  TempSrc: Oral  Oral   SpO2: 97% 93% 97% 99%  Weight:      Height:        Intake/Output Summary (Last 24 hours) at 10/09/2019 1436 Last data filed at 10/09/2019 1215 Gross per 24 hour  Intake 561.75 ml  Output 1850 ml  Net -1288.25 ml   Filed Weights   10/07/19 0338  Weight: 87.1 kg    Examination:  General exam: Appears calm and comfortable  Respiratory system: Clear to auscultation. Respiratory effort normal.no wheezing or rhonchi Cardiovascular system: S1 & S2 heard, RRR. No JVD, murmurs, rubs, gallops or clicks.  Gastrointestinal system: Abdomen is nondistended, soft and nontender. Normal bowel sounds heard. Central nervous system: Alert and oriented x3   Extremities: no edema.  Right upper wrist tender and swollen Skin: warm, dry Psychiatry:  Mood & affect appropriate.     Data Reviewed: I have personally reviewed following labs and imaging studies  CBC: Recent Labs  Lab 10/07/19 0727 10/08/19 0511 10/09/19 0033  WBC 28.1* 23.5* 21.0*  NEUTROABS 24.3*  --   --   HGB 14.4 15.1 15.1  HCT 43.4 44.9 44.7  MCV 93.3 91.6 90.9  PLT 121* 99* 80*   Basic Metabolic Panel: Recent Labs  Lab 10/07/19 0727 10/08/19 0511 10/09/19 0033    NA 134* 135 135  K 5.0 4.2 4.3  CL 102 104 105  CO2 23 23 24   GLUCOSE 140* 148* 164*  BUN 66* 48* 42*  CREATININE 2.44* 1.61* 1.22  CALCIUM 8.6* 8.3* 8.0*   GFR: Estimated Creatinine Clearance: 47.1 mL/min (by C-G formula based on SCr of 1.22 mg/dL). Liver Function Tests: No results for input(s): AST, ALT, ALKPHOS, BILITOT, PROT, ALBUMIN in the last 168 hours. No results for input(s): LIPASE, AMYLASE in the last 168 hours. No results for input(s): AMMONIA in the last 168 hours. Coagulation Profile: No results for input(s): INR, PROTIME in the last 168 hours. Cardiac Enzymes: Recent Labs  Lab 10/07/19 0727  CKTOTAL 305   BNP (last 3 results) No results for input(s): PROBNP in the last 8760 hours. HbA1C: Recent Labs    10/07/19 0727  HGBA1C 6.8*   CBG: Recent Labs  Lab 10/08/19 1152 10/08/19 1713 10/08/19 2124 10/09/19 0804 10/09/19 1209  GLUCAP 175* 140* 129* 142* 129*   Lipid Profile: No results for input(s): CHOL, HDL, LDLCALC, TRIG, CHOLHDL, LDLDIRECT in the last 72 hours. Thyroid Function Tests: No results for input(s): TSH, T4TOTAL, FREET4, T3FREE, THYROIDAB in the last 72 hours. Anemia Panel: No results for input(s): VITAMINB12, FOLATE, FERRITIN, TIBC, IRON, RETICCTPCT in the last 72 hours. Sepsis Labs: Recent Labs  Lab 10/07/19 0831  LATICACIDVEN 1.7    Recent Results (from the past 240 hour(s))  Urine culture     Status: None   Collection Time: 10/07/19  7:27 AM   Specimen: Urine, Random  Result Value Ref Range Status   Specimen Description   Final    URINE, RANDOM Performed at [facility], [address] [place], [place] [address]    Special Requests   Final    NONE Performed at [facility], [address], [place], [place] [address]    Culture   Final    NO GROWTH Performed at [facility], [place] [address] [place], [place] [address]    Report Status 10/08/2019 FINAL  Final  Blood Culture (routine x 2)      Status: Abnormal   Collection Time: 10/07/19  8:31 AM   Specimen: BLOOD  Result Value Ref Range Status   Specimen Description   Final    BLOOD LAC Performed at [facility], [address] [place], [place] [address]    Special Requests   Final    BOTTLES DRAWN AEROBIC AND ANAEROBIC Blood Culture adequate volume Performed at [facility] [address] [place], [place] [address]    Culture  Setup Time   Final    GRAM POSITIVE COCCI IN BOTH AEROBIC AND ANAEROBIC BOTTLES CRITICAL RESULT CALLED TO, READ BACK BY AND VERIFIED WITH: ASAJAH DUNCAN ON 10/07/2019 AT 2035 TIK Performed at [facility], [address] [place], [place] [address]    Culture (A)  Final    GROUP B STREP(S.AGALACTIAE)ISOLATED SUSCEPTIBILITIES PERFORMED ON PREVIOUS CULTURE WITHIN THE LAST 5 DAYS. Performed at [facility], [place] [address], [place], [place] [address]    Report Status 10/09/2019 FINAL  Final  Blood Culture (routine x 2)     Status: Abnormal   Collection Time: 10/07/19  8:31 AM   Specimen: BLOOD  Result Value Ref Range Status   Specimen Description   Final    BLOOD LWRIST Performed at [facility], [address] [place], [place] [address]    Special Requests   Final    BOTTLES DRAWN AEROBIC AND ANAEROBIC Blood Culture adequate volume Performed at [facility], [address] [place], [place] [address]    Culture  Setup Time   Final    GRAM POSITIVE COCCI IN BOTH AEROBIC AND ANAEROBIC BOTTLES CRITICAL RESULT CALLED TO, READ BACK BY AND VERIFIED WITH: ASAJAH DUNCAN ON 10/07/2019 AT 2035 TIK Performed at [facility], [place] [address], [place], [place] [address]    Culture GROUP B STREP(S.AGALACTIAE)ISOLATED (A)  Final   Report Status 10/09/2019 FINAL  Final   Organism ID, Bacteria GROUP B STREP(S.AGALACTIAE)ISOLATED  Final      Susceptibility   Group b strep(s.agalactiae)isolated - MIC*    CLINDAMYCIN <=0.25 SENSITIVE  Sensitive     AMPICILLIN <=0.25 SENSITIVE Sensitive     ERYTHROMYCIN <=0.12 SENSITIVE Sensitive     VANCOMYCIN 0.5 SENSITIVE Sensitive     CEFTRIAXONE <=0.12 SENSITIVE Sensitive     LEVOFLOXACIN 1 SENSITIVE Sensitive     PENICILLIN Value in next row Sensitive      SENSITIVE0.06    * GROUP B STREP(S.AGALACTIAE)ISOLATED  Blood Culture ID Panel (Reflexed)     Status: Abnormal   Collection Time: 10/07/19  8:31 AM  Result Value Ref Range Status   Enterococcus species NOT DETECTED NOT DETECTED Final   Listeria monocytogenes NOT DETECTED NOT DETECTED Final  Staphylococcus species NOT DETECTED NOT DETECTED Final   Staphylococcus aureus (BCID) NOT DETECTED NOT DETECTED Final   Streptococcus species DETECTED (A) NOT DETECTED Final    Comment: CRITICAL RESULT CALLED TO, READ BACK BY AND VERIFIED WITH: ASAJAH DUNCAN ON 10/07/2019 AT 2035 TIK    Streptococcus agalactiae DETECTED (A) NOT DETECTED Final    Comment: CRITICAL RESULT CALLED TO, READ BACK BY AND VERIFIED WITH: ASAJAH DUNCAN ON 10/07/2019 AT 2035 TIK    Streptococcus pneumoniae NOT DETECTED NOT DETECTED Final   Streptococcus pyogenes NOT DETECTED NOT DETECTED Final   Acinetobacter baumannii NOT DETECTED NOT DETECTED Final   Enterobacteriaceae species NOT DETECTED NOT DETECTED Final   Enterobacter cloacae complex NOT DETECTED NOT DETECTED Final   Escherichia coli NOT DETECTED NOT DETECTED Final   Klebsiella oxytoca NOT DETECTED NOT DETECTED Final   Klebsiella pneumoniae NOT DETECTED NOT DETECTED Final   Proteus species NOT DETECTED NOT DETECTED Final   Serratia marcescens NOT DETECTED NOT DETECTED Final   Haemophilus influenzae NOT DETECTED NOT DETECTED Final   Neisseria meningitidis NOT DETECTED NOT DETECTED Final   Pseudomonas aeruginosa NOT DETECTED NOT DETECTED Final   Candida albicans NOT DETECTED NOT DETECTED Final   Candida glabrata NOT DETECTED NOT DETECTED Final   Candida krusei NOT DETECTED NOT DETECTED Final   Candida  parapsilosis NOT DETECTED NOT DETECTED Final   Candida tropicalis NOT DETECTED NOT DETECTED Final    Comment: Performed at [facility], [address] [place],  [address]  Respiratory Panel by RT PCR (Flu A&B, Covid) - Nasopharyngeal Swab     Status: None   Collection Time: 10/07/19 12:27 PM   Specimen: Nasopharyngeal Swab  Result Value Ref Range Status   SARS Coronavirus 2 by RT PCR NEGATIVE NEGATIVE Final    Comment: (NOTE) SARS-CoV-2 target nucleic acids are NOT DETECTED. The SARS-CoV-2 RNA is generally detectable in upper respiratoy specimens during the acute phase of infection. The lowest concentration of SARS-CoV-2 viral copies this assay can detect is 131 copies/mL. A negative result does not preclude SARS-Cov-2 infection and should not be used as the sole basis for treatment or other patient management decisions. A negative result may occur with  improper specimen collection/handling, submission of specimen other than nasopharyngeal swab, presence of viral mutation(s) within the areas targeted by this assay, and inadequate number of viral copies (<131 copies/mL). A negative result must be combined with clinical observations, patient history, and epidemiological information. The expected result is Negative. Fact Sheet for Patients:  PinkCheek.be Fact Sheet for Healthcare Providers:  GravelBags.it This test is not yet ap proved or cleared by the [place] FDA and  has been authorized for detection and/or diagnosis of SARS-CoV-2 by FDA under an Emergency Use Authorization (EUA). This EUA will remain  in effect (meaning this test can be used) for the duration of the COVID-19 declaration under Section 564(b)(1) of the Act, 21 U.S.C. section 360bbb-3(b)(1), unless the authorization is terminated or revoked sooner.    Influenza A by PCR NEGATIVE NEGATIVE Final   Influenza B by PCR NEGATIVE NEGATIVE  Final    Comment: (NOTE) The Xpert Xpress SARS-CoV-2/FLU/RSV assay is intended as an aid in  the diagnosis of influenza from Nasopharyngeal swab specimens and  should not be used as a sole basis for treatment. Nasal washings and  aspirates are unacceptable for Xpert Xpress SARS-CoV-2/FLU/RSV  testing. Fact Sheet for Patients: PinkCheek.be Fact Sheet for Healthcare Providers: GravelBags.it This test is not yet approved or cleared  by the [place] and  has been authorized for detection and/or diagnosis of SARS-CoV-2 by  FDA under an Emergency Use Authorization (EUA). This EUA will remain  in effect (meaning this test can be used) for the duration of the  Covid-19 declaration under Section 564(b)(1) of the Act, 21  U.S.C. section 360bbb-3(b)(1), unless the authorization is  terminated or revoked. Performed at [facility], [address] [place], [place] [address]   Culture, blood (Routine X 2) w Reflex to ID Panel     Status: None (Preliminary result)   Collection Time: 10/09/19 12:31 AM   Specimen: BLOOD LEFT WRIST  Result Value Ref Range Status   Specimen Description BLOOD LEFT WRIST  Final   Special Requests   Final    BOTTLES DRAWN AEROBIC AND ANAEROBIC Blood Culture adequate volume   Culture   Final    NO GROWTH < 12 HOURS Performed at [facility], [address], [place], [place] [address]    Report Status PENDING  Incomplete  Culture, blood (Routine X 2) w Reflex to ID Panel     Status: None (Preliminary result)   Collection Time: 10/09/19 12:33 AM   Specimen: BLOOD LEFT HAND  Result Value Ref Range Status   Specimen Description BLOOD LEFT HAND  Final   Special Requests   Final    BOTTLES DRAWN AEROBIC AND ANAEROBIC Blood Culture adequate volume   Culture   Final    NO GROWTH < 12 HOURS Performed at [facility], [address], [place], [place] [address]    Report Status  PENDING  Incomplete         Radiology Studies: DG Wrist Complete Right  Result Date: 10/08/2019 CLINICAL DATA:  Right wrist pain. EXAM: RIGHT WRIST - COMPLETE 3+ VIEW COMPARISON:  None. FINDINGS: There is no acute displaced fracture or dislocation. There are degenerative changes of the metacarpophalangeal joints. There are degenerative changes of the radiocarpal joint. There are degenerative changes of the scaphoid trapezial joint. IMPRESSION: Negative. Electronically Signed   By: Constance Holster M.D.   On: 10/08/2019 17:11   MR BRAIN WO CONTRAST  Result Date: 10/09/2019 CLINICAL DATA:  Right upper extremity weakness EXAM: MRI HEAD WITHOUT CONTRAST TECHNIQUE: Multiplanar, multiecho pulse sequences of the brain and surrounding structures were obtained without intravenous contrast. COMPARISON:  None. FINDINGS: There is substantial artifact related to left cochlear implant. Majority of the brain is obscured on diffusion weighted imaging. Susceptibility weighted imaging is nondiagnostic. Ventricles appear stable in size. Probable chronic microvascular ischemic changes. IMPRESSION: Substantial artifact related to cochlear implant. Acute infarction causing reported symptoms cannot be excluded. Electronically Signed   By: Macy Mis M.D.   On: 10/09/2019 11:39   ECHOCARDIOGRAM COMPLETE  Result Date: 10/08/2019    ECHOCARDIOGRAM REPORT   Patient Name:   KRISTAFER AYLESWORTH Date of Exam: 10/08/2019 Medical Rec #:  [id number]         Height:       71.0 in Accession #:    [id number]        Weight:       192.0 lb Date of Birth:  [birth year]          BSA:          2.072 m Patient Age:    70 years          BP:           145/96 mmHg Patient Gender: M  HR:           108 bpm. Exam Location:  [facility] Procedure: 2D Echo, Color Doppler and Cardiac Doppler Indications:     Bacteremia 790.7  History:         Patient has no prior history of Echocardiogram examinations.                  Risk Factors:Hypertension  and Diabetes.  Sonographer:     Sherrie Sport RDCS (AE) Referring Phys:  [id number] [facility] Diagnosing Phys: Kathlyn Sacramento MD  Sonographer Comments: Suboptimal apical window. IMPRESSIONS  1. Left ventricular ejection fraction, by estimation, is 25 to 30%. The left ventricle has severely decreased function. The left ventricle has no regional wall motion abnormalities. There is moderate left ventricular hypertrophy. Left ventricular diastolic parameters are consistent with Grade I diastolic dysfunction (impaired relaxation). There is severe akinesis of the left ventricular, mid-apical anteroseptal wall, anterolateral wall, anterior segment, apical segment and inferoseptal wall. Wall  motion abnormalities are suggestive of stress induced cardiomyopathy vs. LAD infarct.  2. Right ventricular systolic function is normal. The right ventricular size is normal. There is mildly elevated pulmonary artery systolic pressure.  3. Left atrial size was mildly dilated.  4. The mitral valve is normal in structure. No evidence of mitral valve regurgitation. No evidence of mitral stenosis.  5. The aortic valve is normal in structure. Aortic valve regurgitation is not visualized. Mild to moderate aortic valve sclerosis/calcification is present, without any evidence of aortic stenosis.  6. The inferior vena cava is normal in size with greater than 50% respiratory variability, suggesting right atrial pressure of 3 mmHg.  7. No clear vegetations but suboptimal study. FINDINGS  Left Ventricle: Left ventricular ejection fraction, by estimation, is 25 to 30%. The left ventricle has severely decreased function. The left ventricle has no regional wall motion abnormalities. Severe akinesis of the left ventricular, mid-apical anteroseptal wall, anterolateral wall, anterior segment, apical segment and inferoseptal wall. The left ventricular internal cavity size was normal in size. There is moderate left ventricular hypertrophy. Left ventricular  diastolic parameters are consistent with Grade I diastolic dysfunction (impaired relaxation). Right Ventricle: The right ventricular size is normal. No increase in right ventricular wall thickness. Right ventricular systolic function is normal. There is mildly elevated pulmonary artery systolic pressure. The tricuspid regurgitant velocity is 2.74  m/s, and with an assumed right atrial pressure of 10 mmHg, the estimated right ventricular systolic pressure is [id number] mmHg. Left Atrium: Left atrial size was mildly dilated. Right Atrium: Right atrial size was normal in size. Pericardium: There is no evidence of pericardial effusion. Mitral Valve: The mitral valve is normal in structure. Normal mobility of the mitral valve leaflets. No evidence of mitral valve regurgitation. No evidence of mitral valve stenosis. Tricuspid Valve: The tricuspid valve is normal in structure. Tricuspid valve regurgitation is mild . No evidence of tricuspid stenosis. Aortic Valve: The aortic valve is normal in structure. Aortic valve regurgitation is not visualized. Mild to moderate aortic valve sclerosis/calcification is present, without any evidence of aortic stenosis. Aortic valve mean gradient measures 3.0 mmHg. Aortic valve peak gradient measures 4.9 mmHg. Aortic valve area, by VTI measures 3.26 cm. Pulmonic Valve: The pulmonic valve was normal in structure. Pulmonic valve regurgitation is not visualized. No evidence of pulmonic stenosis. Aorta: The aortic root is normal in size and structure. Venous: The inferior vena cava is normal in size with greater than 50% respiratory variability, suggesting right atrial pressure of 3 mmHg.  IAS/Shunts: No atrial level shunt detected by color flow Doppler.  LEFT VENTRICLE PLAX 2D LVIDd:         3.67 cm      Diastology LVIDs:         2.79 cm      LV e' lateral:   3.92 cm/s LV PW:         1.52 cm      LV E/e' lateral: 10.5 LV IVS:        1.45 cm      LV e' medial:    4.79 cm/s LVOT diam:     2.25 cm       LV E/e' medial:  8.6 LV SV:         53 LV SV Index:   26 LVOT Area:     3.98 cm  LV Volumes (MOD) LV vol d, MOD A2C: 124.0 ml LV vol d, MOD A4C: 65.1 ml LV vol s, MOD A2C: 83.1 ml LV vol s, MOD A4C: 61.5 ml LV SV MOD A2C:     40.9 ml LV SV MOD A4C:     65.1 ml LV SV MOD BP:      25.6 ml RIGHT VENTRICLE RV Basal diam:  4.01 cm RV S prime:     12.90 cm/s TAPSE (M-mode): 3.1 cm LEFT ATRIUM             Index       RIGHT ATRIUM           Index LA diam:        3.20 cm 1.54 cm/m  RA Area:     13.30 cm LA Vol (A2C):   36.6 ml 17.66 ml/m RA Volume:   32.70 ml  15.78 ml/m LA Vol (A4C):   58.2 ml 28.08 ml/m LA Biplane Vol: 45.7 ml 22.05 ml/m  AORTIC VALVE                   PULMONIC VALVE AV Area (Vmax):    3.08 cm    PV Vmax:        0.48 m/s AV Area (Vmean):   3.20 cm    PV Peak grad:   0.9 mmHg AV Area (VTI):     3.26 cm    RVOT Peak grad: 1 mmHg AV Vmax:           110.50 cm/s AV Vmean:          84.500 cm/s AV VTI:            0.162 m AV Peak Grad:      4.9 mmHg AV Mean Grad:      3.0 mmHg LVOT Vmax:         85.50 cm/s LVOT Vmean:        68.100 cm/s LVOT VTI:          0.133 m LVOT/AV VTI ratio: 0.82  AORTA Ao Root diam: 3.20 cm MITRAL VALVE               TRICUSPID VALVE MV Area (PHT): 5.79 cm    TR Peak grad:   30.0 mmHg MV Decel Time: 131 msec    TR Vmax:        274.00 cm/s MV E velocity: 41.10 cm/s MV A velocity: 63.40 cm/s  SHUNTS MV E/A ratio:  0.65        Systemic VTI:  0.13 m  Systemic Diam: 2.25 cm Kathlyn Sacramento MD Electronically signed by Kathlyn Sacramento MD Signature Date/Time: 10/08/2019/3:30:04 PM    Final         Scheduled Meds: . carvedilol  3.125 mg Oral BID WC  . cyclobenzaprine  5 mg Oral TID  . finasteride  5 mg Oral Daily  . insulin aspart  0-15 Units Subcutaneous TID WC  . insulin aspart  4 Units Subcutaneous TID WC  . lidocaine  1 patch Transdermal Q24H  . psyllium  1 packet Oral Daily  . tamsulosin  0.8 mg Oral Daily  . timolol  1 drop Both Eyes BID    Continuous Infusions: . penicillin g continuous IV infusion 12 Million Units (10/09/19 [id number])    Assessment & Plan:   Principal Problem:   AKI (acute kidney injury) ([place]) Active Problems:   Diabetes mellitus without complication ([place])   Essential hypertension, benign   CKD (chronic kidney disease) stage 2, GFR 60-89 ml/min   Back pain   Leukocytosis  Acute kidney injury Superimposed on underlying chronic kidney disease stage II At baseline patient has a serum creatinine of 1.4 and  on admission it is 2.4 Acute kidney injury may be secondary to NSAID ? V.s. dehydration v.s. combo CK negative thus ruled out rhabdomyolysis Improved Hold losartan Continue monitoring  Cardiomyopathy-new diagnosis Echo found with 25 to 30%. Euvolemic on exam DC IV fluids cardiology consulted-initiate beta-blockers.  Hold off on ACE inhibitors, ARB, Entresto and spironolactone given recent AKI Ideally will need left right heart cath for his new cardiomyopathy but will need to hold off due to thrombocytopenia and bacteremia Will need ischemic evaluation prior to discharge  Bacteremia- bcx + for strep Agalac.  Started on PcN G , will continue Needs tee r/o endocarditis Source is unclear If tee neg. May need ct a/p ID following Will need repeat bcx to see if bacteremia has cleared  Diabetes mellitus With complications of stage II chronic kidney disease Maintain consistent carbohydrate diet Sliding scale coverage with NovoLog   Hypertension Uncontrolled and due to pain Start patient on amlodipine 10 mg daily since losartan is on hold for acute kidney injury Place patient on Labetalol 10mg  IV q 6 PRN SBP > 132mmHg    Acute on chronic low back pain Imaging not suggestive of any acute pathology at this time Patient noted to have lumbar spondylosis and degenerative disc disease. Pain control Physical therapy Fall precautions Needs neurosurgery outpatient follow-up for C-spine  evaluation.  He may not be a good candidate.  Right wrist pain-secondary to fall X-ray negative Keep wrist elevated   ?  Right sided weakness per PT Neurology consulted and input appreciated -MRI brain ordered this a.m.-Per neuro  MRI reviewed and there is good amount of motion artifact neurology did not think anything new on there or acute - probably degenerative, progressive disease and deconditioning - I didn't appreciate any focal weakness on exam except generalized weakness  Leukocytosis 2/2 bacteremia , see above Improving.   DVT prophylaxis: scd Code Status:full Family Communication: none at bedside Disposition Plan: back to previous home environment Barrier: Patient with bacteremia, needs IV abx, w/u for bacteremia, needs a TEE, now with new cardiomyopathy, likely be here 2 more days        LOS: 2 days   Time spent:45 min with >50% on coc    Nolberto Hanlon, MD [facility] Pager [phone]  If 7PM-7AM, please contact night-coverage [email or web address] Password [facility] 10/09/2019, 2:36 PM Patient ID: Quintella Reichert  [place], male   DOB: 11/20/1931, 86 y.o.   MRN: [id number]

## 2019-10-10 ENCOUNTER — Encounter
Admission: EM | Disposition: A | Discharge status: Skilled Nursing Facility | Payer: Self-pay | Source: Home / Self Care | Attending: Internal Medicine

## 2019-10-10 DIAGNOSIS — R1314 Dysphagia, pharyngoesophageal phase: Secondary | ICD-10-CM | POA: Diagnosis not present

## 2019-10-10 DIAGNOSIS — I5020 Unspecified systolic (congestive) heart failure: Secondary | ICD-10-CM

## 2019-10-10 DIAGNOSIS — N179 Acute kidney failure, unspecified: Secondary | ICD-10-CM

## 2019-10-10 DIAGNOSIS — J3801 Paralysis of vocal cords and larynx, unilateral: Secondary | ICD-10-CM | POA: Diagnosis not present

## 2019-10-10 DIAGNOSIS — I5022 Chronic systolic (congestive) heart failure: Secondary | ICD-10-CM

## 2019-10-10 LAB — CBC
HCT: 48.2 % (ref 39.0–52.0)
Hemoglobin: 16.7 g/dL (ref 13.0–17.0)
MCH: 30.9 pg (ref 26.0–34.0)
MCHC: 34.6 g/dL (ref 30.0–36.0)
MCV: 89.3 fL (ref 80.0–100.0)
Platelets: 74 10*3/uL — ABNORMAL LOW (ref 150–400)
RBC: 5.4 MIL/uL (ref 4.22–5.81)
RDW: 13.3 % (ref 11.5–15.5)
WBC: 15.5 10*3/uL — ABNORMAL HIGH (ref 4.0–10.5)
nRBC: 0 % (ref 0.0–0.2)

## 2019-10-10 LAB — GLUCOSE, CAPILLARY
Glucose-Capillary: 167 mg/dL — ABNORMAL HIGH (ref 70–99)
Glucose-Capillary: 157 mg/dL — ABNORMAL HIGH (ref 70–99)
Glucose-Capillary: 91 mg/dL (ref 70–99)
Glucose-Capillary: 158 mg/dL — ABNORMAL HIGH (ref 70–99)

## 2019-10-10 SURGERY — ECHOCARDIOGRAM, TRANSESOPHAGEAL
Anesthesia: Moderate Sedation

## 2019-10-10 MED ORDER — CARVEDILOL 6.25 MG PO TABS
6.2500 mg | ORAL_TABLET | Freq: Two times a day (BID) | ORAL | Status: DC
  Administered 2019-10-12 – 2019-10-16 (×8): 6.25 mg via ORAL
  Filled 2019-10-10 (×8): qty 1

## 2019-10-10 NOTE — Progress Notes (Signed)
Physical Therapy Treatment Patient Details Name: Dustin Harrison MRN: [id number] DOB: September 16, 1931 Today's Date: 10/10/2019    History of Present Illness Pt. is an 86 y.o. male who presented to ER secondary to progressive neck/back pain and R UE weakness after fall 2 days prior to admission; admitted for management of AKI and bacteremia (unknown source at this time).  C-spine, T-spine and L-spine imaging significant for prominent C3-4 and C4-5 impingement, moderate C2-3 and L4-5 impingement and mild C5-6, C7-T1, L3-4 and L5-S1 impingement    PT Comments    Trialed quad cane with gait efforts per patient request, min/mod assist; unable to appropriately make contact with floor (tends to rock), limited stablity evident.  Transitioned to L loftstrand, min/mod assit, but continues to demonstrate difficulty with sequencing and overall use of unilateral device.  Does prefer loftstrand vs quad cane, but require consisent cuing for sequence/technique with gait efforts. Extended discussion with patient/family regarding care needs and level of assist currently required upon discharge; reviewed benefits of continued therapy (CIR) prior to return home to maximize functional indep and minimize burden of care.  Patient/family agreeable and interested in pursuing continued rehab services; on phone with CIR admission coordinator for initial screening end of session.   Follow Up Recommendations  CIR     Equipment Recommendations       Recommendations for Other Services       Precautions / Restrictions Precautions Precautions: Fall Precaution Comments: NPO Restrictions Weight Bearing Restrictions: No    Mobility  Bed Mobility Overal bed mobility: Needs Assistance Bed Mobility: Supine to Sit     Supine to sit: Mod assist Sit to supine: Min guard   General bed mobility comments: significant assist for truncal elevation; limited tolerance for cervical, thoracic or lumbar flexion, limited ability to  dissociate trunk and extremities; relies heavily on elevated HOB and L UE assist to generate truncal elevation  Transfers Overall transfer level: Needs assistance Equipment used: 1 person hand held assist Transfers: Sit to/from Stand Sit to Stand: Min assist;Mod assist         General transfer comment: multiple attempts, use of momentum and L UE support required to complete; maintains broad BOS and continues with limited lumbar extension  Ambulation/Gait Ambulation/Gait assistance: Min assist;Mod assist Gait Distance (Feet): 80 Feet         General Gait Details: choppy, staggered gait pattern with limited balance reactions; unable to complete dynamic gait components without +1 assist to maintain balance.  Trialed quad cane per patient request, min/mod assist; unable to appropriately make contact with floor (tends to rock), limited stablity evident.  Transitioned to L loftstrand, min/mod assit, but continues to demonstrate difficulty with sequencing and overall use of unilateral device.  Does prefer loftstrand vs quad cane, but require consisent cuing for sequence/technique with gait efforts.   Stairs             Wheelchair Mobility    Modified Rankin (Stroke Patients Only)       Balance Overall balance assessment: Needs assistance Sitting-balance support: No upper extremity supported;Feet supported Sitting balance-Leahy Scale: Good     Standing balance support: Single extremity supported Standing balance-Leahy Scale: Poor                              Cognition Arousal/Alertness: Awake/alert Behavior During Therapy: WFL for tasks assessed/performed Overall Cognitive Status: Within Functional Limits for tasks assessed  Exercises Other Exercises Other Exercises: Toilet transfer, ambulatory without assist device, min/mod assist; decreased balance with dynamic gait activities (turn negotiation,  surface changes, obstacle negotiation).  Standing balance at sink for hand hygiene, min assist; limited functional reach, increased fall risk, evident. Relies on grab bar/UE support to maintain balance at all times.    General Comments        Pertinent Vitals/Pain Pain Assessment: Faces Pain Score: 5  Faces Pain Scale: Hurts a little bit Pain Location: R wrist/hand Pain Descriptors / Indicators: Aching;Grimacing;Guarding Pain Intervention(s): Limited activity within patient's tolerance;Monitored during session;Premedicated before session    Home Living                 Additional Comments: Resident of [place]    Prior Function        Comments: Mod indep with SPC for ADLs, household and community mobilization; walks daily; denies additional fall history. Pt indep with ADL, IADL, driving, and caregiver for wife (min assist for basic ADL and mobility)   PT Goals (current goals can now be found in the care plan section) Acute Rehab PT Goals Patient Stated Goal: get back home to care for my wife PT Goal Formulation: With patient/family Time For Goal Achievement: 10/22/19 Potential to Achieve Goals: Good Progress towards PT goals: Progressing toward goals    Frequency    7X/week      PT Plan Current plan remains appropriate    Co-evaluation              AM-PAC PT "6 Clicks" Mobility   Outcome Measure  Help needed turning from your back to your side while in a flat bed without using bedrails?: A Little Help needed moving from lying on your back to sitting on the side of a flat bed without using bedrails?: A Lot Help needed moving to and from a bed to a chair (including a wheelchair)?: A Little Help needed standing up from a chair using your arms (e.g., wheelchair or bedside chair)?: A Little Help needed to walk in hospital room?: A Lot Help needed climbing 3-5 steps with a railing? : A Lot 6 Click Score: 15    End of Session Equipment Utilized  During Treatment: Gait belt Activity Tolerance: Patient tolerated treatment well Patient left: in bed;with call bell/phone within reach;with bed alarm set(patient/family on phone with CIR admission screener) Nurse Communication: Mobility status PT Visit Diagnosis: Muscle weakness (generalized) (M62.81);Difficulty in walking, not elsewhere classified (R26.2);Hemiplegia and hemiparesis Hemiplegia - Right/Left: Right Hemiplegia - dominant/non-dominant: Dominant Hemiplegia - caused by: Unspecified     Time: [id number] PT Time Calculation (min) (ACUTE ONLY): 27 min  Charges:  $Gait Training: 8-22 mins $Therapeutic Activity: 8-22 mins                     Kameka Whan H. Owens Shark, PT, DPT, NCS 10/10/19, 2:57 PM [id number]

## 2019-10-10 NOTE — Consult Note (Signed)
[name]  [phone] [phone]  ID: Dustin Harrison OB: Apr 28, 1932  MR#: [id number]  AOZ#:[id number]  Patient Care Team: Jinny Sanders, MD as PCP - General Leandrew Koyanagi, MD as Referring Physician (Ophthalmology) Dasher, Rayvon Char, MD as Referring Physician (Dermatology)  CHIEF COMPLAINT: Leukocytosis and thrombocytopenia in the setting of group B Streptococcus bacteremia.  INTERVAL HISTORY: Patient is 86 year old male who was initially admitted to the hospital with increasing back pain as well as worsening weakness and fatigue.  Upon evaluation in the emergency room he was noted to have a worsening renal function.  Subsequent blood cultures were positive for group B streptococcus bacteremia without definitive source.  Initially he had a significant leukocytosis with atypical granulocytes as well as a mild thrombocytopenia.  Over the past several days, his leukocytosis has improved, but his platelet counts have declined.  Currently, patient feels improved from admission but not back to baseline.  He has no neurologic complaints.  He denies any fevers.  He has a fair appetite, but denies weight loss.  He has no chest pain, shortness of breath, cough, or hemoptysis.  He continues to have significant back pain.  He has no nausea, vomiting, constipation, or diarrhea.  He has no urinary complaints.  Patient otherwise feels well and offers no further specific complaints today.  REVIEW OF SYSTEMS:   Review of Systems  Constitutional: Positive for malaise/fatigue. Negative for fever and weight loss.  Respiratory: Negative.  Negative for cough and shortness of breath.   Cardiovascular: Negative.  Negative for chest pain and leg swelling.  Gastrointestinal: Negative.  Negative for abdominal pain, blood in stool and melena.  Genitourinary: Negative.  Negative for dysuria.  Musculoskeletal: Positive for back pain, falls and neck pain.  Skin: Negative.     Neurological: Positive for weakness. Negative for dizziness, focal weakness and headaches.  Psychiatric/Behavioral: Negative.  The patient is not nervous/anxious.     As per HPI. Otherwise, a complete review of systems is negative.  PAST MEDICAL HISTORY: Past Medical History:  Diagnosis Date  . Abnormal glucose   . Arthritis   . Baker's cyst of knee    left  . BPH (benign prostatic hypertrophy)   . Cough    because of "tight" esophagus  . Diabetes mellitus   . Glaucoma   . HOH (hard of hearing)   . Hypertension   . Pheochromocytoma of right adrenal gland   . Ulcer   . Wears hearing aid    bilateral    PAST SURGICAL HISTORY: Past Surgical History:  Procedure Laterality Date  . adrenaletomy    . CATARACT EXTRACTION W/PHACO Left 10/08/2015   Procedure: CATARACT EXTRACTION PHACO AND INTRAOCULAR LENS PLACEMENT ([place]) left eye;  Surgeon: Leandrew Koyanagi, MD;  Location: [facility];  Service: Ophthalmology;  Laterality: Left;  MALYUGIN  . HERNIA REPAIR    . SQUAMOUS CELL CARCINOMA EXCISION  03-2006   left ear  . TONSILLECTOMY      FAMILY HISTORY: Family History  Problem Relation Age of Onset  . Alcohol abuse Mother   . Coronary artery disease Mother   . Brain cancer Father        tumor  . Diabetes Brother     ADVANCED DIRECTIVES (Y/N):  _0 @  HEALTH MAINTENANCE: Social History   Tobacco Use  . Smoking status: Never Smoker  . Smokeless tobacco: Never Used  Substance Use Topics  . Alcohol use: Yes    Alcohol/week: 7.0 standard drinks  Types: 7 Glasses of wine per week  . Drug use: No     Colonoscopy:  PAP:  Bone density:  Lipid panel:  No Known Allergies  Current Facility-Administered Medications  Medication Dose Route Frequency Provider Last Rate Last Admin  . carvedilol (COREG) tablet 6.25 mg  6.25 mg Oral BID WC Agbor-Etang, Aaron Edelman, MD      . cyclobenzaprine (FLEXERIL) tablet 5 mg  5 mg Oral TID Agbata, Tochukwu, MD   5 mg at 10/08/19  1021  . finasteride (PROSCAR) tablet 5 mg  5 mg Oral Daily Agbata, Tochukwu, MD   Stopped at 10/10/19 1022  . HYDROcodone-acetaminophen (NORCO/VICODIN) 5-325 MG per tablet 1 tablet  1 tablet Oral Q6H PRN Agbata, Tochukwu, MD   1 tablet at 10/10/19 1248  . insulin aspart (novoLOG) injection 0-15 Units  0-15 Units Subcutaneous TID WC Agbata, Tochukwu, MD   3 Units at 10/10/19 1520  . insulin aspart (novoLOG) injection 4 Units  4 Units Subcutaneous TID WC Agbata, Tochukwu, MD   4 Units at 10/10/19 1520  . labetalol (NORMODYNE) injection 10 mg  10 mg Intravenous Q6H PRN Agbata, Tochukwu, MD   10 mg at 10/09/19 2147  . lidocaine (LIDODERM) 5 % 1 patch  1 patch Transdermal Q24H Agbata, Tochukwu, MD   1 patch at 10/10/19 1517  . losartan (COZAAR) tablet 12.5 mg  12.5 mg Oral Daily End, Christopher, MD   Stopped at 10/10/19 1022  . morphine 2 MG/ML injection 2 mg  2 mg Intravenous Q4H PRN Lang Snow, NP   2 mg at 10/09/19 2108  . ondansetron (ZOFRAN) tablet 4 mg  4 mg Oral Q6H PRN Agbata, Tochukwu, MD       Or  . ondansetron (ZOFRAN) injection 4 mg  4 mg Intravenous Q6H PRN Agbata, Tochukwu, MD      . penicillin G potassium 12 Million Units in dextrose 5 % 500 mL continuous infusion  12 Million Units Intravenous Q12H Berton Mount, RPH 41.7 mL/hr at 10/10/19 1149 12 Million Units at 10/10/19 1149  . psyllium (HYDROCIL/METAMUCIL) packet 1 packet  1 packet Oral Daily Agbata, Tochukwu, MD   Stopped at 10/08/19 1030  . tamsulosin (FLOMAX) capsule 0.8 mg  0.8 mg Oral Daily Agbata, Tochukwu, MD   Stopped at 10/10/19 1022  . timolol (TIMOPTIC) 0.5 % ophthalmic solution 1 drop  1 drop Both Eyes BID Agbata, Tochukwu, MD   1 drop at 10/10/19 2209    OBJECTIVE: Vitals:   10/10/19 0446 10/10/19 1249  BP: (!) 150/108 (!) 157/97  Pulse: 95 75  Resp: 20 18  Temp: 98.5 F (36.9 C) 97.6 F (36.4 C)  SpO2: 92% 97%     Body mass index is 26.78 kg/m.    ECOG FS:2 - Symptomatic, <50% confined to  bed  General: Well-developed, well-nourished, no acute distress. Eyes: Pink conjunctiva, anicteric sclera. HEENT: Normocephalic, moist mucous membranes. Lungs: No audible wheezing or coughing. Heart: Regular rate and rhythm. Abdomen: Soft, nontender, no obvious distention. Musculoskeletal: No edema, cyanosis, or clubbing. Neuro: Alert, answering all questions appropriately. Cranial nerves grossly intact. Skin: No rashes or petechiae noted. Psych: Normal affect. Lymphatics: No cervical, calvicular, axillary or inguinal LAD.   LAB RESULTS:  Lab Results  Component Value Date   NA 135 10/09/2019   K 4.3 10/09/2019   CL 105 10/09/2019   CO2 24 10/09/2019   GLUCOSE 164 (H) 10/09/2019   BUN 42 (H) 10/09/2019   CREATININE 1.22 10/09/2019   CALCIUM  8.0 (L) 10/09/2019   PROT 6.4 05/18/2019   ALBUMIN 4.1 05/18/2019   AST 22 05/18/2019   ALT 22 05/18/2019   ALKPHOS 62 05/18/2019   BILITOT 1.0 05/18/2019   GFRNONAA 54 (L) 10/09/2019   GFRAA >60 10/09/2019    Lab Results  Component Value Date   WBC 15.5 (H) 10/10/2019   NEUTROABS 24.3 (H) 10/07/2019   HGB 16.7 10/10/2019   HCT 48.2 10/10/2019   MCV 89.3 10/10/2019   PLT 74 (L) 10/10/2019     STUDIES: DG Chest 2 View  Result Date: 10/07/2019 CLINICAL DATA:  Weakness over the past couple days with chronic low back pain. 2 falls over the past week. EXAM: CHEST - 2 VIEW COMPARISON:  None. FINDINGS: Lungs are adequately inflated without focal airspace consolidation or effusion. Calcified right suprahilar lymph nodes. Cardiomediastinal silhouette is unremarkable. Suggestion of an old right clavicle fracture. No acute fracture. Degenerative change of the spine. IMPRESSION: No acute findings. Electronically Signed   By: Marin Olp M.D.   On: 10/07/2019 07:53   DG Lumbar Spine 2-3 Views  Result Date: 10/07/2019 CLINICAL DATA:  Weakness. Low back pain. Two falls over the past week. EXAM: LUMBAR SPINE - 2-3 VIEW COMPARISON:  03/15/2017  FINDINGS: Vertebral body heights are maintained. There is moderate spondylosis of the lumbar spine to include facet arthropathy. Minimal disc space narrowing throughout the lumbar spine with some sparing of the L5-S1 level. No compression fracture. Subtle grade 1 anterolisthesis of L4 on L5 unchanged due to facet arthropathy. IMPRESSION: 1.  No acute findings. 2. Moderate spondylosis of the lumbar spine with mild multilevel disc disease. Subtle stable grade 1 anterolisthesis of L4 on L5 due to facet arthropathy. Electronically Signed   By: Marin Olp M.D.   On: 10/07/2019 07:56   DG Wrist Complete Right  Result Date: 10/08/2019 CLINICAL DATA:  Right wrist pain. EXAM: RIGHT WRIST - COMPLETE 3+ VIEW COMPARISON:  None. FINDINGS: There is no acute displaced fracture or dislocation. There are degenerative changes of the metacarpophalangeal joints. There are degenerative changes of the radiocarpal joint. There are degenerative changes of the scaphoid trapezial joint. IMPRESSION: Negative. Electronically Signed   By: Constance Holster M.D.   On: 10/08/2019 17:11   CT Head Wo Contrast  Result Date: 10/07/2019 CLINICAL DATA:  Multiple falls EXAM: CT HEAD WITHOUT CONTRAST TECHNIQUE: Contiguous axial images were obtained from the base of the skull through the vertex without intravenous contrast. COMPARISON:  None. FINDINGS: There is streak artifact at the level of left cochlear implant apparatus. Below findings are within this limitation. Brain: There is no acute intracranial hemorrhage, mass effect, or edema. Gray-white differentiation is preserved. There is no extra-axial fluid collection. Prominence of the ventricles and sulci reflects mild generalized parenchymal volume loss. Patchy hypoattenuation in the supratentorial white matter is nonspecific may reflect mild chronic microvascular ischemic changes. Vascular: There is atherosclerotic calcification at the skull base. Skull: Calvarium is unremarkable apart from  below. Sinuses/Orbits: Mild mucosal thickening. No significant orbital finding. Other: Left canal wall up mastoidectomy with cochlear implant. IMPRESSION: No evidence of acute intracranial injury. Electronically Signed   By: Macy Mis M.D.   On: 10/07/2019 07:57   CT Cervical Spine Wo Contrast  Result Date: 10/07/2019 CLINICAL DATA:  Multiple falls EXAM: CT CERVICAL SPINE WITHOUT CONTRAST TECHNIQUE: Multidetector CT imaging of the cervical spine was performed without intravenous contrast. Multiplanar CT image reconstructions were also generated. COMPARISON:  None. FINDINGS: Alignment: Accentuation of cervical lordosis is  likely related to positioning. Mild anterolisthesis at C5-C6. Skull base and vertebrae: There is no acute cervical spine fracture. No destructive osseous lesion. Soft tissues and spinal canal: No prevertebral fluid or swelling. No visible canal hematoma. Disc levels: Multilevel degenerative changes are present including disc space narrowing, endplate osteophytes, and facet and uncovertebral hypertrophy. There is no high-grade osseous encroachment on the spinal canal. Multilevel significant neural foraminal stenosis is present. Upper chest: No apical lung mass. Healed right clavicle fracture partially imaged. Other: None. IMPRESSION: No acute cervical spine fracture. Electronically Signed   By: Macy Mis M.D.   On: 10/07/2019 08:01   MR BRAIN WO CONTRAST  Result Date: 10/09/2019 CLINICAL DATA:  Right upper extremity weakness EXAM: MRI HEAD WITHOUT CONTRAST TECHNIQUE: Multiplanar, multiecho pulse sequences of the brain and surrounding structures were obtained without intravenous contrast. COMPARISON:  None. FINDINGS: There is substantial artifact related to left cochlear implant. Majority of the brain is obscured on diffusion weighted imaging. Susceptibility weighted imaging is nondiagnostic. Ventricles appear stable in size. Probable chronic microvascular ischemic changes. IMPRESSION:  Substantial artifact related to cochlear implant. Acute infarction causing reported symptoms cannot be excluded. Electronically Signed   By: Macy Mis M.D.   On: 10/09/2019 11:39   MR Cervical Spine Wo Contrast  Result Date: 10/07/2019 CLINICAL DATA:  Hypertension, diabetes, back pain with generalized weakness. EXAM: MRI CERVICAL SPINE WITHOUT CONTRAST TECHNIQUE: Multiplanar, multisequence MR imaging of the cervical spine was performed. No intravenous contrast was administered. COMPARISON:  CT scan dated 10/07/2019 FINDINGS: Alignment: Mildly exaggerated upper cervical lordosis. 2 mm degenerative anterolisthesis at C6-7 and C7-T1. Vertebrae: No significant vertebral marrow edema is identified. Interbody fusion at C5-6. Small erosion along the posterior odontoid on image 6/1 eccentric to the left. Disc desiccation throughout cervical spine. Cord: No significant abnormal spinal cord signal is observed. Posterior Fossa, vertebral arteries, paraspinal tissues: Unremarkable Disc levels: C2-3: Moderate right and mild left foraminal stenosis due to facet and uncinate spurring. Mild disc bulge. C3-4: Prominent left and moderate right foraminal stenosis and borderline central narrowing of the thecal sac due to facet and uncinate spurring along with disc bulge. C4-5: Prominent bilateral foraminal stenosis mild central narrowing of the thecal sac due to facet and uncinate spurring with disc bulge. Possible small left paracentral disc protrusion. C5-6: Mild bilateral foraminal stenosis due to facet and uncinate spurring. C6-7: No impingement. Right paracentral disc protrusion and disc uncovering. C7-T1: Mild bilateral foraminal stenosis due to uncinate and facet spurring. T1-2: Left perineural cysts noted common no overt impingement. IMPRESSION: 1. Cervical spondylosis and degenerative disc disease, causing prominent impingement at C3-4 and C4-5; moderate impingement at C2-3; and mild impingement at C5-6 and C7-T1, as  detailed above. Electronically Signed   By: Van Clines M.D.   On: 10/07/2019 10:00   MR THORACIC SPINE WO CONTRAST  Result Date: 10/07/2019 CLINICAL DATA:  Back pain in weakness. EXAM: MRI THORACIC SPINE WITHOUT CONTRAST TECHNIQUE: Multiplanar, multisequence MR imaging of the thoracic spine was performed. No intravenous contrast was administered. COMPARISON:  Chest radiograph 10/07/2019 FINDINGS: Alignment:  2 mm degenerative anterolisthesis at T1-2. Vertebrae: Small hemangiomas in the T3 and T6 vertebra. Multilevel Schmorl's nodes. Mild degenerative endplate findings at J0-93 and T11-12. Disc desiccation throughout the thoracic spine with loss of disc height most notable at T7-8, T9-10, T10-11, T11-12. No significant vertebral marrow edema is identified. Cord:  Unremarkable Paraspinal and other soft tissues: Potential mild interstitial accentuation in the lungs although not as readily apparent on the  dedicated chest radiograph. Disc levels: Mild disc bulges at T8-9, T9-10, T11-12 along with disc bulge and a shallow right paracentral disc protrusion at T3-4, but no observed impingement. Left foraminal perineural cyst at T1-2. IMPRESSION: 1. Thoracic spondylosis and degenerative disc disease without observed impingement. 2. Equivocal mild interstitial accentuation in the lungs although not as readily apparent on the dedicated chest radio Electronically Signed   By: Van Clines M.D.   On: 10/07/2019 10:06   MR LUMBAR SPINE WO CONTRAST  Result Date: 10/07/2019 CLINICAL DATA:  Weakness and back pain EXAM: MRI LUMBAR SPINE WITHOUT CONTRAST TECHNIQUE: Multiplanar, multisequence MR imaging of the lumbar spine was performed. No intravenous contrast was administered. COMPARISON:  Lumbar radiographs 10/07/2019 FINDINGS: Despite efforts by the technologist and patient, motion artifact is present on today's exam and could not be eliminated. This reduces exam sensitivity and specificity. Segmentation: The  lowest lumbar type non-rib-bearing vertebra is labeled as L5. Alignment:  4 mm degenerative anterolisthesis at L4-5. Vertebrae: Disc desiccation at all levels between L1 and L5 with loss of disc height at L3-4 and L4-5. Type 1 degenerative endplate findings at O0-3 with a Schmorl's node along the superior endplate of L4. No lumbar spine fracture or acute bony findings. Conus medullaris and cauda equina: Conus extends to the T12 level. Conus and cauda equina appear normal. Paraspinal and other soft tissues: Cortical thinning in the right kidney lower pole favoring right renal atrophy although only a portion of the right kidney is included. Disc levels: T12-L1: Unremarkable. L1-2: No impingement. Diffuse disc bulge. L2-3: No impingement. Diffuse disc bulge. L3-4: Mild right foraminal stenosis with mild displacement of the right L3 nerve the right lateral extraforaminal space borderline right subarticular lateral recess stenosis as well as borderline central narrowing of the thecal sac due to disc bulge, intervertebral spurring, and facet arthropathy. L4-5: Moderate central narrowing of the thecal sac with mild to moderate right borderline left foraminal stenosis as well as mild bilateral subarticular lateral recess stenosis due to disc uncovering, disc bulge, intervertebral spurring, facet arthropathy. Small left facet joint effusion. L5-S1: Mild left foraminal stenosis with mild left and borderline right subarticular lateral recess stenosis due to disc bulge facet arthropathy. Small left facet joint effusion. IMPRESSION: 1. Lumbar spondylosis and degenerative disc disease, causing moderate impingement at L4-5 and mild impingement at L3-4 and L5-S1. 2. Cortical thinning of the right kidney lower pole suggesting right renal atrophy. Electronically Signed   By: Van Clines M.D.   On: 10/07/2019 09:51   ECHOCARDIOGRAM COMPLETE  Result Date: 10/08/2019    ECHOCARDIOGRAM REPORT   Patient Name:   Dustin Harrison  Date of Exam: 10/08/2019 Medical Rec #:  [id number]         Height:       71.0 in Accession #:    [id number]        Weight:       192.0 lb Date of Birth:  Jul 25, 1931          BSA:          2.072 m Patient Age:    60 years          BP:           145/96 mmHg Patient Gender: M                 HR:           108 bpm. Exam Location:  [facility] Procedure: 2D Echo, Color Doppler and Cardiac Doppler  Indications:     Bacteremia 790.7  History:         Patient has no prior history of Echocardiogram examinations.                  Risk Factors:Hypertension and Diabetes.  Sonographer:     Sherrie Sport RDCS (AE) Referring Phys:  [id number] [facility] Diagnosing Phys: Kathlyn Sacramento MD  Sonographer Comments: Suboptimal apical window. IMPRESSIONS  1. Left ventricular ejection fraction, by estimation, is 25 to 30%. The left ventricle has severely decreased function. The left ventricle has no regional wall motion abnormalities. There is moderate left ventricular hypertrophy. Left ventricular diastolic parameters are consistent with Grade I diastolic dysfunction (impaired relaxation). There is severe akinesis of the left ventricular, mid-apical anteroseptal wall, anterolateral wall, anterior segment, apical segment and inferoseptal wall. Wall  motion abnormalities are suggestive of stress induced cardiomyopathy vs. LAD infarct.  2. Right ventricular systolic function is normal. The right ventricular size is normal. There is mildly elevated pulmonary artery systolic pressure.  3. Left atrial size was mildly dilated.  4. The mitral valve is normal in structure. No evidence of mitral valve regurgitation. No evidence of mitral stenosis.  5. The aortic valve is normal in structure. Aortic valve regurgitation is not visualized. Mild to moderate aortic valve sclerosis/calcification is present, without any evidence of aortic stenosis.  6. The inferior vena cava is normal in size with greater than 50% respiratory variability, suggesting right atrial  pressure of 3 mmHg.  7. No clear vegetations but suboptimal study. FINDINGS  Left Ventricle: Left ventricular ejection fraction, by estimation, is 25 to 30%. The left ventricle has severely decreased function. The left ventricle has no regional wall motion abnormalities. Severe akinesis of the left ventricular, mid-apical anteroseptal wall, anterolateral wall, anterior segment, apical segment and inferoseptal wall. The left ventricular internal cavity size was normal in size. There is moderate left ventricular hypertrophy. Left ventricular diastolic parameters are consistent with Grade I diastolic dysfunction (impaired relaxation). Right Ventricle: The right ventricular size is normal. No increase in right ventricular wall thickness. Right ventricular systolic function is normal. There is mildly elevated pulmonary artery systolic pressure. The tricuspid regurgitant velocity is 2.74  m/s, and with an assumed right atrial pressure of 10 mmHg, the estimated right ventricular systolic pressure is 15.7 mmHg. Left Atrium: Left atrial size was mildly dilated. Right Atrium: Right atrial size was normal in size. Pericardium: There is no evidence of pericardial effusion. Mitral Valve: The mitral valve is normal in structure. Normal mobility of the mitral valve leaflets. No evidence of mitral valve regurgitation. No evidence of mitral valve stenosis. Tricuspid Valve: The tricuspid valve is normal in structure. Tricuspid valve regurgitation is mild . No evidence of tricuspid stenosis. Aortic Valve: The aortic valve is normal in structure. Aortic valve regurgitation is not visualized. Mild to moderate aortic valve sclerosis/calcification is present, without any evidence of aortic stenosis. Aortic valve mean gradient measures 3.0 mmHg. Aortic valve peak gradient measures 4.9 mmHg. Aortic valve area, by VTI measures 3.26 cm. Pulmonic Valve: The pulmonic valve was normal in structure. Pulmonic valve regurgitation is not  visualized. No evidence of pulmonic stenosis. Aorta: The aortic root is normal in size and structure. Venous: The inferior vena cava is normal in size with greater than 50% respiratory variability, suggesting right atrial pressure of 3 mmHg. IAS/Shunts: No atrial level shunt detected by color flow Doppler.  LEFT VENTRICLE PLAX 2D LVIDd:         3.67  cm      Diastology LVIDs:         2.79 cm      LV e' lateral:   3.92 cm/s LV PW:         1.52 cm      LV E/e' lateral: 10.5 LV IVS:        1.45 cm      LV e' medial:    4.79 cm/s LVOT diam:     2.25 cm      LV E/e' medial:  8.6 LV SV:         53 LV SV Index:   26 LVOT Area:     3.98 cm  LV Volumes (MOD) LV vol d, MOD A2C: 124.0 ml LV vol d, MOD A4C: 65.1 ml LV vol s, MOD A2C: 83.1 ml LV vol s, MOD A4C: 61.5 ml LV SV MOD A2C:     40.9 ml LV SV MOD A4C:     65.1 ml LV SV MOD BP:      25.6 ml RIGHT VENTRICLE RV Basal diam:  4.01 cm RV S prime:     12.90 cm/s TAPSE (M-mode): 3.1 cm LEFT ATRIUM             Index       RIGHT ATRIUM           Index LA diam:        3.20 cm 1.54 cm/m  RA Area:     13.30 cm LA Vol (A2C):   36.6 ml 17.66 ml/m RA Volume:   32.70 ml  15.78 ml/m LA Vol (A4C):   58.2 ml 28.08 ml/m LA Biplane Vol: 45.7 ml 22.05 ml/m  AORTIC VALVE                   PULMONIC VALVE AV Area (Vmax):    3.08 cm    PV Vmax:        0.48 m/s AV Area (Vmean):   3.20 cm    PV Peak grad:   0.9 mmHg AV Area (VTI):     3.26 cm    RVOT Peak grad: 1 mmHg AV Vmax:           110.50 cm/s AV Vmean:          84.500 cm/s AV VTI:            0.162 m AV Peak Grad:      4.9 mmHg AV Mean Grad:      3.0 mmHg LVOT Vmax:         85.50 cm/s LVOT Vmean:        68.100 cm/s LVOT VTI:          0.133 m LVOT/AV VTI ratio: 0.82  AORTA Ao Root diam: 3.20 cm MITRAL VALVE               TRICUSPID VALVE MV Area (PHT): 5.79 cm    TR Peak grad:   30.0 mmHg MV Decel Time: 131 msec    TR Vmax:        274.00 cm/s MV E velocity: 41.10 cm/s MV A velocity: 63.40 cm/s  SHUNTS MV E/A ratio:  0.65         Systemic VTI:  0.13 m                            Systemic Diam: 2.25 cm Kathlyn Sacramento MD Electronically signed by Rogue Jury  Arida MD Signature Date/Time: 10/08/2019/3:30:04 PM    Final     ASSESSMENT: Leukocytosis and thrombocytopenia in the setting of group B Streptococcus bacteremia.  PLAN:    1.  Leukocytosis: Likely secondary to underlying bacteremia.  White blood cell count has improved from 28.1-15.5.  Immature granulocytes seen on differential on Oct 07, 2019 are likely a result of underlying infection.  We will repeat CBC with differential in the a.m. along with peripheral blood smear to ensure continued resolution. 2.  Thrombocytopenia: Patient noted to have a normal platelet count in 2019.  Upon admission his platelet is 121 and is now decreased to 74.  Likely secondary to ongoing infection.  Have ordered DIC labs for completeness.  Patient does not require bone marrow biopsy at this time.  Would consider 1 if laboratory work did not resolve upon resolution of his bacteremia. 3.  Group B streptococcus bacteremia: No obvious source of infection.  Appreciate ID input.  TTE unrevealing, patient will likely require TEE in the near future.  Appreciate consult, will follow.   Lloyd Huger, MD   10/10/2019 11:06 PM

## 2019-10-10 NOTE — Progress Notes (Signed)
ID Doing better No fveer Still has pain rt arm Participating in PT  Patient Vitals for the past 24 hrs:  BP Temp Temp src Pulse Resp SpO2  10/10/19 0446 (!) 150/108 98.5 F (36.9 C) Oral 95 20 92 %  10/09/19 1951 (!) 176/110 98.4 F (36.9 C) Oral 99 20 96 %  10/09/19 1211 (!) 151/96 98.3 F (36.8 C) -- 84 16 99 %   Awake and alert Rt hand swollen Rt arm by hte side Moves rt arm but limited due to pain Walking with PT   CBC Latest Ref Rng & Units 10/10/2019 10/09/2019 10/08/2019  WBC 4.0 - 10.5 K/uL 15.5(H) 21.0(H) 23.5(H)  Hemoglobin 13.0 - 17.0 g/dL 16.7 15.1 15.1  Hematocrit 39.0 - 52.0 % 48.2 44.7 44.9  Platelets 150 - 400 K/uL 74(L) 80(L) 99(L)     CMP Latest Ref Rng & Units 10/09/2019 10/08/2019 10/07/2019  Glucose 70 - 99 mg/dL 164(H) 148(H) 140(H)  BUN 8 - 23 mg/dL 42(H) 48(H) 66(H)  Creatinine 0.61 - 1.24 mg/dL 1.22 1.61(H) 2.44(H)  Sodium 135 - 145 mmol/L 135 135 134(L)  Potassium 3.5 - 5.1 mmol/L 4.3 4.2 5.0  Chloride 98 - 111 mmol/L 105 104 102  CO2 22 - 32 mmol/L 24 23 23   Calcium 8.9 - 10.3 mg/dL 8.0(L) 8.3(L) 8.6(L)  Total Protein 6.0 - 8.3 g/dL - - -  Total Bilirubin 0.2 - 1.2 mg/dL - - -  Alkaline Phos 39 - 117 U/L - - -  AST 0 - 37 U/L - - -  ALT 0 - 53 U/L - - -     Impression/recommendation  Group B streptococcus bacteremia.  Unclear source.  He has no skin lesions.  He has cervical and lumbar pain but MRI without contrast did not show any discitis or paraspinal abscess but I am still concerned that he may have a deep-seated infection.  2D echo has been done and suboptimal study and EF is low. He is awaiting TEE to rule out endocarditis.  Repeat blood cultures sent to 10/09/2019 -neg so far Would get a CT abdomen and pelvis to look for any source if TEE negative.   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 Discussed the management with the patient and his family.

## 2019-10-10 NOTE — Progress Notes (Addendum)
PROGRESS NOTE    Dustin Harrison  FGH:[id number] DOB: 1931-08-14 DOA: 10/07/2019 PCP: Jinny Sanders, MD (Confirm with patient/family/NH records and if not entered, this HAS to be entered at [facility] point of entry. "No PCP" if truly none.)   Brief Narrative: (Start on day 1 of progress note - keep it brief and live) Dustin Haskin Johnsonis a 86 y.o.malewith medical history significant forhypertension, diabetes mellitus and chronic low back pain who presents to the emergency room for evaluation of worsening low back pain and generalized weakness. Patient had  noncontrasted MRI of cervical, thoracic and lumbar spines, showed a significant degenerative disc disease, but no evidence of osteomyelitis. Patient also had positive blood culture with group B streptococcus. Penicillin G is started after consultation with GI.   Assessment & Plan:   Principal Problem:   AKI (acute kidney injury) ([place]) Active Problems:   Diabetes mellitus without complication ([place])   Essential hypertension, benign   CKD (chronic kidney disease) stage 2, GFR 60-89 ml/min   Back pain   Leukocytosis   Cardiomyopathy ([place])   Bacteremia  #1. Group B streptococcus bacteremia. Patient does not have any sepsis. He had a back pain at time of admission, no evidence of osteomyelitis. No evidence to suggest pneumonia, UTI or GI source. However, patient had significant elevation of immature granulocytes at 2.9. Will obtain peripheral smear and repeat a CBC tomorrow. Repeated blood cultures performed yesterday are negative. Appreciate ID consult, continue penicillin G and the guidance of ID. Continue discussion about the possibility of TEE with cardiology.  #2. Chronic systolic congestive heart failure. New finding of ejection fraction 25 to 30%. Currently on beta-blocker. Followed by cardiology.  #3. Thrombocytopenia. We will perform peripheral smear. Will consult to hematology/oncology if confirmed significant elevation of  immature granulocytes.  4. Acute kidney injury. Renal function had improved  5. Type 2 diabetes. Continue current regimen.  #6. Essential hypertension.  Continue same home medicines. ACE inhibitor and ARB still on hold due to AKI.  #7.  right-sided weakness. Patient neurology consulted, no evidence of acute stroke.   DVT prophylaxis: SCDs Code Status: Full Family Communication: Discussed with patient, all questions answered. Disposition Plan:  . Patient came from: Home           . Anticipated d/c place: Home . Barriers to d/c OR conditions which need to be met to effect a safe d/c:   Consultants:   Neurology  Cardiology  Infectious disease  GI  Procedures: None Antimicrobials: Penicillin G.  Subjective: Patient doing well today. Is n.p.o. pending decision about the TEE. Denies any back pain today. No short of breath or cough. No diarrhea. No dysuria hematuria. No fever or chills.  Objective: Vitals:   10/09/19 0315 10/09/19 1211 10/09/19 1951 10/10/19 0446  BP: (!) 148/94 (!) 151/96 (!) 176/110 (!) 150/108  Pulse: 85 84 99 95  Resp: _0 Temp: 97.8 F (36.6 C) 98.3 F (36.8 C) 98.4 F (36.9 C) 98.5 F (36.9 C)  TempSrc: Oral  Oral Oral  SpO2: 97% 99% 96% 92%  Weight:      Height:        Intake/Output Summary (Last 24 hours) at 10/10/2019 1019 Last data filed at 10/10/2019 0741 Gross per 24 hour  Intake 264.8 ml  Output 1150 ml  Net -885.2 ml   Filed Weights   10/07/19 0338  Weight: 87.1 kg    Examination:  General exam: Appears calm and comfortable  Respiratory system:  Clear to auscultation. Respiratory effort normal. Cardiovascular system: S1 & S2 heard, RRR. No JVD, murmurs, rubs, gallops or clicks. No pedal edema. Gastrointestinal system: Abdomen is nondistended, soft and nontender. No organomegaly or masses felt. Normal bowel sounds heard. Central nervous system: Alert and oriented. No focal neurological deficits. Extremities:  Symmetric 5 x 5 power. Skin: No rashes, lesions or ulcers Psychiatry: Judgement and insight appear normal. Mood & affect appropriate.     Data Reviewed: I have personally reviewed following labs and imaging studies  CBC: Recent Labs  Lab 10/07/19 0727 10/08/19 0511 10/09/19 0033 10/10/19 0415  WBC 28.1* 23.5* 21.0* 15.5*  NEUTROABS 24.3*  --   --   --   HGB 14.4 15.1 15.1 16.7  HCT 43.4 44.9 44.7 48.2  MCV 93.3 91.6 90.9 89.3  PLT 121* 99* 80* 74*   Basic Metabolic Panel: Recent Labs  Lab 10/07/19 0727 10/08/19 0511 10/09/19 0033  NA 134* 135 135  K 5.0 4.2 4.3  CL 102 104 105  CO2 _0 GLUCOSE 140* 148* 164*  BUN 66* 48* 42*  CREATININE 2.44* 1.61* 1.22  CALCIUM 8.6* 8.3* 8.0*   GFR: Estimated Creatinine Clearance: 47.1 mL/min (by C-G formula based on SCr of 1.22 mg/dL). Liver Function Tests: No results for input(s): AST, ALT, ALKPHOS, BILITOT, PROT, ALBUMIN in the last 168 hours. No results for input(s): LIPASE, AMYLASE in the last 168 hours. No results for input(s): AMMONIA in the last 168 hours. Coagulation Profile: No results for input(s): INR, PROTIME in the last 168 hours. Cardiac Enzymes: Recent Labs  Lab 10/07/19 0727  CKTOTAL 305   BNP (last 3 results) No results for input(s): PROBNP in the last 8760 hours. HbA1C: No results for input(s): HGBA1C in the last 72 hours. CBG: Recent Labs  Lab 10/09/19 0804 10/09/19 1209 10/09/19 1730 10/09/19 2052 10/10/19 0733  GLUCAP 142* 129* 156* 147* 167*   Lipid Profile: No results for input(s): CHOL, HDL, LDLCALC, TRIG, CHOLHDL, LDLDIRECT in the last 72 hours. Thyroid Function Tests: No results for input(s): TSH, T4TOTAL, FREET4, T3FREE, THYROIDAB in the last 72 hours. Anemia Panel: No results for input(s): VITAMINB12, FOLATE, FERRITIN, TIBC, IRON, RETICCTPCT in the last 72 hours. Sepsis Labs: Recent Labs  Lab 10/07/19 0831  LATICACIDVEN 1.7    Recent Results (from the past 240 hour(s))    Urine culture     Status: None   Collection Time: 10/07/19  7:27 AM   Specimen: Urine, Random  Result Value Ref Range Status   Specimen Description   Final    URINE, RANDOM Performed at [facility], [address], [place], [place] [address]    Special Requests   Final    NONE Performed at [facility], [address], [place], [place] [address]    Culture   Final    NO GROWTH Performed at [facility], [place] [address], [place], [place] [address]    Report Status 10/08/2019 FINAL  Final  Blood Culture (routine x 2)     Status: Abnormal   Collection Time: 10/07/19  8:31 AM   Specimen: BLOOD  Result Value Ref Range Status   Specimen Description   Final    BLOOD LAC Performed at [facility], [address], [place], [place] [address]    Special Requests   Final    BOTTLES DRAWN AEROBIC AND ANAEROBIC Blood Culture adequate volume Performed at [facility], [address] [place], [place] [address]  Culture  Setup Time   Final    GRAM POSITIVE COCCI IN BOTH AEROBIC AND ANAEROBIC BOTTLES CRITICAL RESULT CALLED TO, READ BACK BY AND VERIFIED WITH: ASAJAH DUNCAN ON 10/07/2019 AT 2035 TIK Performed at [facility], [address] [place], [place] [address]    Culture (A)  Final    GROUP B STREP(S.AGALACTIAE)ISOLATED SUSCEPTIBILITIES PERFORMED ON PREVIOUS CULTURE WITHIN THE LAST 5 DAYS. Performed at [facility], [place] [address] [place], [place] [address]    Report Status 10/09/2019 FINAL  Final  Blood Culture (routine x 2)     Status: Abnormal   Collection Time: 10/07/19  8:31 AM   Specimen: BLOOD  Result Value Ref Range Status   Specimen Description   Final    BLOOD LWRIST Performed at [facility], [address] [place], [place] [address]    Special Requests   Final    BOTTLES DRAWN AEROBIC AND ANAEROBIC Blood Culture adequate volume Performed at [facility], [address] [place]  [place], [place] [address]    Culture  Setup Time   Final    GRAM POSITIVE COCCI IN BOTH AEROBIC AND ANAEROBIC BOTTLES CRITICAL RESULT CALLED TO, READ BACK BY AND VERIFIED WITH: ASAJAH DUNCAN ON 10/07/2019 AT 2035 TIK Performed at [facility], [place] [address] [place], [place] [address]    Culture GROUP B STREP(S.AGALACTIAE)ISOLATED (A)  Final   Report Status 10/09/2019 FINAL  Final   Organism ID, Bacteria GROUP B STREP(S.AGALACTIAE)ISOLATED  Final      Susceptibility   Group b strep(s.agalactiae)isolated - MIC*    CLINDAMYCIN <=0.25 SENSITIVE Sensitive     AMPICILLIN <=0.25 SENSITIVE Sensitive     ERYTHROMYCIN <=0.12 SENSITIVE Sensitive     VANCOMYCIN 0.5 SENSITIVE Sensitive     CEFTRIAXONE <=0.12 SENSITIVE Sensitive     LEVOFLOXACIN 1 SENSITIVE Sensitive     PENICILLIN Value in next row Sensitive      SENSITIVE0.06    * GROUP B STREP(S.AGALACTIAE)ISOLATED  Blood Culture ID Panel (Reflexed)     Status: Abnormal   Collection Time: 10/07/19  8:31 AM  Result Value Ref Range Status   Enterococcus species NOT DETECTED NOT DETECTED Final   Listeria monocytogenes NOT DETECTED NOT DETECTED Final   Staphylococcus species NOT DETECTED NOT DETECTED Final   Staphylococcus aureus (BCID) NOT DETECTED NOT DETECTED Final   Streptococcus species DETECTED (A) NOT DETECTED Final    Comment: CRITICAL RESULT CALLED TO, READ BACK BY AND VERIFIED WITH: ASAJAH DUNCAN ON 10/07/2019 AT 2035 TIK    Streptococcus agalactiae DETECTED (A) NOT DETECTED Final    Comment: CRITICAL RESULT CALLED TO, READ BACK BY AND VERIFIED WITH: ASAJAH DUNCAN ON 10/07/2019 AT 2035 TIK    Streptococcus pneumoniae NOT DETECTED NOT DETECTED Final   Streptococcus pyogenes NOT DETECTED NOT DETECTED Final   Acinetobacter baumannii NOT DETECTED NOT DETECTED Final   Enterobacteriaceae species NOT DETECTED NOT DETECTED Final   Enterobacter cloacae complex NOT DETECTED NOT DETECTED Final   Escherichia coli NOT DETECTED NOT  DETECTED Final   Klebsiella oxytoca NOT DETECTED NOT DETECTED Final   Klebsiella pneumoniae NOT DETECTED NOT DETECTED Final   Proteus species NOT DETECTED NOT DETECTED Final   Serratia marcescens NOT DETECTED NOT DETECTED Final   Haemophilus influenzae NOT DETECTED NOT DETECTED Final   Neisseria meningitidis NOT DETECTED NOT DETECTED Final   Pseudomonas aeruginosa NOT DETECTED NOT DETECTED Final   Candida albicans NOT DETECTED NOT DETECTED Final   Candida glabrata  NOT DETECTED NOT DETECTED Final   Candida krusei NOT DETECTED NOT DETECTED Final   Candida parapsilosis NOT DETECTED NOT DETECTED Final   Candida tropicalis NOT DETECTED NOT DETECTED Final    Comment: Performed at [facility], [address] [place], [place] [address]  Respiratory Panel by RT PCR (Flu A&B, Covid) - Nasopharyngeal Swab     Status: None   Collection Time: 10/07/19 12:27 PM   Specimen: Nasopharyngeal Swab  Result Value Ref Range Status   SARS Coronavirus 2 by RT PCR NEGATIVE NEGATIVE Final    Comment: (NOTE) SARS-CoV-2 target nucleic acids are NOT DETECTED. The SARS-CoV-2 RNA is generally detectable in upper respiratoy specimens during the acute phase of infection. The lowest concentration of SARS-CoV-2 viral copies this assay can detect is 131 copies/mL. A negative result does not preclude SARS-Cov-2 infection and should not be used as the sole basis for treatment or other patient management decisions. A negative result may occur with  improper specimen collection/handling, submission of specimen other than nasopharyngeal swab, presence of viral mutation(s) within the areas targeted by this assay, and inadequate number of viral copies (<131 copies/mL). A negative result must be combined with clinical observations, patient history, and epidemiological information. The expected result is Negative. Fact Sheet for Patients:  PinkCheek.be Fact Sheet for Healthcare  Providers:  GravelBags.it This test is not yet ap proved or cleared by the [place] FDA and  has been authorized for detection and/or diagnosis of SARS-CoV-2 by FDA under an Emergency Use Authorization (EUA). This EUA will remain  in effect (meaning this test can be used) for the duration of the COVID-19 declaration under Section 564(b)(1) of the Act, 21 U.S.C. section 360bbb-3(b)(1), unless the authorization is terminated or revoked sooner.    Influenza A by PCR NEGATIVE NEGATIVE Final   Influenza B by PCR NEGATIVE NEGATIVE Final    Comment: (NOTE) The Xpert Xpress SARS-CoV-2/FLU/RSV assay is intended as an aid in  the diagnosis of influenza from Nasopharyngeal swab specimens and  should not be used as a sole basis for treatment. Nasal washings and  aspirates are unacceptable for Xpert Xpress SARS-CoV-2/FLU/RSV  testing. Fact Sheet for Patients: PinkCheek.be Fact Sheet for Healthcare Providers: GravelBags.it This test is not yet approved or cleared by the [place] FDA and  has been authorized for detection and/or diagnosis of SARS-CoV-2 by  FDA under an Emergency Use Authorization (EUA). This EUA will remain  in effect (meaning this test can be used) for the duration of the  Covid-19 declaration under Section 564(b)(1) of the Act, 21  U.S.C. section 360bbb-3(b)(1), unless the authorization is  terminated or revoked. Performed at [facility], [address] [place], [place] [address]   Culture, blood (Routine X 2) w Reflex to ID Panel     Status: None (Preliminary result)   Collection Time: 10/09/19 12:31 AM   Specimen: BLOOD LEFT WRIST  Result Value Ref Range Status   Specimen Description BLOOD LEFT WRIST  Final   Special Requests   Final    BOTTLES DRAWN AEROBIC AND ANAEROBIC Blood Culture adequate volume   Culture   Final    NO GROWTH 1 DAY Performed at [facility], [address], [place],  [address]    Report Status PENDING  Incomplete  Culture, blood (Routine X 2) w Reflex to ID Panel     Status: None (Preliminary result)   Collection Time: 10/09/19 12:33 AM   Specimen: BLOOD LEFT HAND  Result Value Ref Range Status   Specimen Description BLOOD LEFT HAND  Final   Special Requests   Final    BOTTLES DRAWN AEROBIC AND ANAEROBIC Blood Culture adequate volume   Culture   Final    NO GROWTH 1 DAY Performed at [facility], [address] [place], [place] [address]    Report Status PENDING  Incomplete         Radiology Studies: DG Wrist Complete Right  Result Date: 10/08/2019 CLINICAL DATA:  Right wrist pain. EXAM: RIGHT WRIST - COMPLETE 3+ VIEW COMPARISON:  None. FINDINGS: There is no acute displaced fracture or dislocation. There are degenerative changes of the metacarpophalangeal joints. There are degenerative changes of the radiocarpal joint. There are degenerative changes of the scaphoid trapezial joint. IMPRESSION: Negative. Electronically Signed   By: Constance Holster M.D.   On: 10/08/2019 17:11   MR BRAIN WO CONTRAST  Result Date: 10/09/2019 CLINICAL DATA:  Right upper extremity weakness EXAM: MRI HEAD WITHOUT CONTRAST TECHNIQUE: Multiplanar, multiecho pulse sequences of the brain and surrounding structures were obtained without intravenous contrast. COMPARISON:  None. FINDINGS: There is substantial artifact related to left cochlear implant. Majority of the brain is obscured on diffusion weighted imaging. Susceptibility weighted imaging is nondiagnostic. Ventricles appear stable in size. Probable chronic microvascular ischemic changes. IMPRESSION: Substantial artifact related to cochlear implant. Acute infarction causing reported symptoms cannot be excluded. Electronically Signed   By: Macy Mis M.D.   On: 10/09/2019 11:39   ECHOCARDIOGRAM COMPLETE  Result Date: 10/08/2019    ECHOCARDIOGRAM REPORT    Patient Name:   Dustin Harrison Date of Exam: 10/08/2019 Medical Rec #:  [id number]         Height:       71.0 in Accession #:    [id number]        Weight:       192.0 lb Date of Birth:  25-Sep-1931          BSA:          2.072 m Patient Age:    76 years          BP:           145/96 mmHg Patient Gender: M                 HR:           108 bpm. Exam Location:  [facility] Procedure: 2D Echo, Color Doppler and Cardiac Doppler Indications:     Bacteremia 790.7  History:         Patient has no prior history of Echocardiogram examinations.                  Risk Factors:Hypertension and Diabetes.  Sonographer:     Sherrie Sport RDCS (AE) Referring Phys:  [id number] [facility] Diagnosing Phys: Kathlyn Sacramento MD  Sonographer Comments: Suboptimal apical window. IMPRESSIONS  1. Left ventricular ejection fraction, by estimation, is 25 to 30%. The left ventricle has severely decreased function. The left ventricle has no regional wall motion abnormalities. There is moderate left ventricular hypertrophy. Left ventricular diastolic parameters are consistent with Grade I diastolic dysfunction (impaired relaxation). There is severe akinesis of the left ventricular, mid-apical anteroseptal wall, anterolateral wall, anterior segment, apical segment and inferoseptal wall. Wall  motion abnormalities are suggestive of stress induced cardiomyopathy vs. LAD infarct.  2. Right ventricular systolic function is normal. The right ventricular size is normal. There is mildly elevated pulmonary artery systolic  pressure.  3. Left atrial size was mildly dilated.  4. The mitral valve is normal in structure. No evidence of mitral valve regurgitation. No evidence of mitral stenosis.  5. The aortic valve is normal in structure. Aortic valve regurgitation is not visualized. Mild to moderate aortic valve sclerosis/calcification is present, without any evidence of aortic stenosis.  6. The inferior vena cava is normal in size with greater than 50% respiratory  variability, suggesting right atrial pressure of 3 mmHg.  7. No clear vegetations but suboptimal study. FINDINGS  Left Ventricle: Left ventricular ejection fraction, by estimation, is 25 to 30%. The left ventricle has severely decreased function. The left ventricle has no regional wall motion abnormalities. Severe akinesis of the left ventricular, mid-apical anteroseptal wall, anterolateral wall, anterior segment, apical segment and inferoseptal wall. The left ventricular internal cavity size was normal in size. There is moderate left ventricular hypertrophy. Left ventricular diastolic parameters are consistent with Grade I diastolic dysfunction (impaired relaxation). Right Ventricle: The right ventricular size is normal. No increase in right ventricular wall thickness. Right ventricular systolic function is normal. There is mildly elevated pulmonary artery systolic pressure. The tricuspid regurgitant velocity is 2.74  m/s, and with an assumed right atrial pressure of 10 mmHg, the estimated right ventricular systolic pressure is 69.6 mmHg. Left Atrium: Left atrial size was mildly dilated. Right Atrium: Right atrial size was normal in size. Pericardium: There is no evidence of pericardial effusion. Mitral Valve: The mitral valve is normal in structure. Normal mobility of the mitral valve leaflets. No evidence of mitral valve regurgitation. No evidence of mitral valve stenosis. Tricuspid Valve: The tricuspid valve is normal in structure. Tricuspid valve regurgitation is mild . No evidence of tricuspid stenosis. Aortic Valve: The aortic valve is normal in structure. Aortic valve regurgitation is not visualized. Mild to moderate aortic valve sclerosis/calcification is present, without any evidence of aortic stenosis. Aortic valve mean gradient measures 3.0 mmHg. Aortic valve peak gradient measures 4.9 mmHg. Aortic valve area, by VTI measures 3.26 cm. Pulmonic Valve: The pulmonic valve was normal in structure. Pulmonic  valve regurgitation is not visualized. No evidence of pulmonic stenosis. Aorta: The aortic root is normal in size and structure. Venous: The inferior vena cava is normal in size with greater than 50% respiratory variability, suggesting right atrial pressure of 3 mmHg. IAS/Shunts: No atrial level shunt detected by color flow Doppler.  LEFT VENTRICLE PLAX 2D LVIDd:         3.67 cm      Diastology LVIDs:         2.79 cm      LV e' lateral:   3.92 cm/s LV PW:         1.52 cm      LV E/e' lateral: 10.5 LV IVS:        1.45 cm      LV e' medial:    4.79 cm/s LVOT diam:     2.25 cm      LV E/e' medial:  8.6 LV SV:         53 LV SV Index:   26 LVOT Area:     3.98 cm  LV Volumes (MOD) LV vol d, MOD A2C: 124.0 ml LV vol d, MOD A4C: 65.1 ml LV vol s, MOD A2C: 83.1 ml LV vol s, MOD A4C: 61.5 ml LV SV MOD A2C:     40.9 ml LV SV MOD A4C:     65.1 ml LV SV MOD BP:  25.6 ml RIGHT VENTRICLE RV Basal diam:  4.01 cm RV S prime:     12.90 cm/s TAPSE (M-mode): 3.1 cm LEFT ATRIUM             Index       RIGHT ATRIUM           Index LA diam:        3.20 cm 1.54 cm/m  RA Area:     13.30 cm LA Vol (A2C):   36.6 ml 17.66 ml/m RA Volume:   32.70 ml  15.78 ml/m LA Vol (A4C):   58.2 ml 28.08 ml/m LA Biplane Vol: 45.7 ml 22.05 ml/m  AORTIC VALVE                   PULMONIC VALVE AV Area (Vmax):    3.08 cm    PV Vmax:        0.48 m/s AV Area (Vmean):   3.20 cm    PV Peak grad:   0.9 mmHg AV Area (VTI):     3.26 cm    RVOT Peak grad: 1 mmHg AV Vmax:           110.50 cm/s AV Vmean:          84.500 cm/s AV VTI:            0.162 m AV Peak Grad:      4.9 mmHg AV Mean Grad:      3.0 mmHg LVOT Vmax:         85.50 cm/s LVOT Vmean:        68.100 cm/s LVOT VTI:          0.133 m LVOT/AV VTI ratio: 0.82  AORTA Ao Root diam: 3.20 cm MITRAL VALVE               TRICUSPID VALVE MV Area (PHT): 5.79 cm    TR Peak grad:   30.0 mmHg MV Decel Time: 131 msec    TR Vmax:        274.00 cm/s MV E velocity: 41.10 cm/s MV A velocity: 63.40 cm/s  SHUNTS MV E/A  ratio:  0.65        Systemic VTI:  0.13 m                            Systemic Diam: 2.25 cm Kathlyn Sacramento MD Electronically signed by Kathlyn Sacramento MD Signature Date/Time: 10/08/2019/3:30:04 PM    Final         Scheduled Meds: . carvedilol  6.25 mg Oral BID WC  . cyclobenzaprine  5 mg Oral TID  . finasteride  5 mg Oral Daily  . insulin aspart  0-15 Units Subcutaneous TID WC  . insulin aspart  4 Units Subcutaneous TID WC  . lidocaine  1 patch Transdermal Q24H  . losartan  12.5 mg Oral Daily  . psyllium  1 packet Oral Daily  . tamsulosin  0.8 mg Oral Daily  . timolol  1 drop Both Eyes BID   Continuous Infusions: . penicillin g continuous IV infusion 12 Million Units (10/09/19 2146)     LOS: 3 days    Time spent: 45 minutes    Sharen Hones, MD [facility]   To contact the attending provider between 7A-7P or the covering provider during after hours 7P-7A, please log into the web site [email or web address] and access using universal [place] password for that web site. If you  do not have the password, please call the hospital operator.  10/10/2019, 10:19 AM

## 2019-10-10 NOTE — Progress Notes (Signed)
SLP Cancellation Note  Patient Details Name: Dustin Harrison MRN: [id number] DOB: 08/27/1931   Cancelled treatment:       Reason Eval/Treat Not Completed: (chart reviewed; consulted NSG). Reviewed chart notes noting pt's oral diet (regular) had been reinstated per MD order. Noted per Neurology note today that pt's overall status, weakness, and lingual deviation had improved somewhat. With improvement, SLP offered option of a repeat swallow study(MBSS) tomorrow to objectively assess pharyngeal swallowing and any continued risk for aspiration -- see previous MBSS results indicating aspiration of thin and Nectar liquids; NSG will f/u w/ MD re: this option and the current diet order until the MBSS.  ST services wil f/u in the morning.    Orinda Kenner, MS, CCC-SLP Kelii Chittum 10/10/2019, 3:41 PM

## 2019-10-10 NOTE — TOC Initial Note (Addendum)
Transition of Care [facility]) - Initial/Assessment Note    Patient Details  Name: Dustin Harrison MRN: [id number] Date of Birth: [birth year]  Transition of Care [facility]) CM/SW Contact:    Candie Chroman, LCSW Phone Number: 10/10/2019, 1:08 PM  Clinical Narrative: CSW met with patient. Wife and daughter at bedside. CSW introduced role and explained that PT recommendations would be discussed. Patient and his wife would prefer for him to return home rather than CIR or SNF. Patient lives at [place]. They would prefer to do the outpatient therapy available in the community. Notified them of plan for home IV abx, per ID pharmacist. Patient and family asked if [facility] can manage the IV abx. CSW called admissions coordinator who stated that patient would have to get a nurse through a home health agency and would also have to get therapy through them until he has completed the IV abx because insurance will not cover both a home health nurse and outpatient therapy. Once patient has completed his IV abx he is welcome to start outpatient therapy. CSW provided patient and his family with CMS scores and asked them to call with preferences. Patient has a cane at home and would like one with 4 prongs. Will call [employer] to see if he can get one. CSW had notified Carolynn Sayers, RN with [facility] and Jhonnie Garner, admissions coordinator with CIR.  2:22 pm: Per PT, patient and family now want CIR. CIR admissions coordinator confirmed she has spoken with them and will start insurance authorization depending on anticipated discharge date.  Expected Discharge Plan: [place] Barriers to Discharge: Continued Medical Work up   Patient Goals and CMS Choice   CMS Medicare.gov Compare Post Acute Care list provided to:: Patient(Wife and daughter at bedside.)    Expected Discharge Plan and Services Expected Discharge Plan: [place] Choice: [place] arrangements for the past 2 months: [place]                                      Prior Living Arrangements/Services Living arrangements for the past 2 months: [place] Lives with:: Spouse Patient language and need for interpreter reviewed:: Yes Do you feel safe going back to the place where you live?: Yes      Need for Family Participation in Patient Care: Yes (Comment) Care giver support system in place?: Yes (comment) Current home services: DME Criminal Activity/Legal Involvement Pertinent to Current Situation/Hospitalization: No - Comment as needed  Activities of Daily Living Home Assistive Devices/Equipment: None ADL Screening (condition at time of admission) Patient's cognitive ability adequate to safely complete daily activities?: Yes Is the patient deaf or have difficulty hearing?: Yes Does the patient have difficulty seeing, even when wearing glasses/contacts?: No Does the patient have difficulty concentrating, remembering, or making decisions?: No Patient able to express need for assistance with ADLs?: Yes Does the patient have difficulty dressing or bathing?: No Independently performs ADLs?: Yes (appropriate for developmental age) Does the patient have difficulty walking or climbing stairs?: Yes Weakness of Legs: Both Weakness of Arms/Hands: Both  Permission Sought/Granted Permission sought to share information with : Facility Sport and exercise psychologist, Family Supports Permission granted to share information with : Yes, Verbal Permission Granted  Share Information with NAME: Jonathen Rathman and Ronney Lion  Permission granted to  share info w AGENCY: [place] granted to share info w Relationship: Wife and daughter  Permission granted to share info w Contact Information: Joann: [phone]: [phone]  Emotional Assessment Appearance:: Appears stated  age Attitude/Demeanor/Rapport: Engaged, Gracious Affect (typically observed): Accepting, Appropriate, Calm, Pleasant Orientation: : Oriented to Self, Oriented to Place, Oriented to  Time, Oriented to Situation Alcohol / Substance Use: Not Applicable Psych Involvement: No (comment)  Admission diagnosis:  Generalized weakness [R53.1] AKI (acute kidney injury) ([place]) [N17.9] Acute kidney injury ([place]) [N17.9] Chronic midline low back pain without sciatica [M54.5, G89.29] Leukocytosis, unspecified type [D72.829] Patient Active Problem List   Diagnosis Date Noted  . HFrEF (heart failure with reduced ejection fraction) ([place])   . Cardiomyopathy ([place])   . Bacteremia   . AKI (acute kidney injury) ([place]) 10/07/2019  . Back pain 10/07/2019  . Leukocytosis 10/07/2019  . Acute neck pain 05/25/2019  . CKD (chronic kidney disease) stage 2, GFR 60-89 ml/min 04/23/2016  . Hearing loss 04/23/2016  . Counseling regarding end of life decision making 04/15/2015  . Essential hypertension, benign 08/22/2012  . Diabetes mellitus without complication ([place]) 68/37/2902  . HYPERCHOLESTEROLEMIA 05/12/2009  . History of pheochromocytoma 09/07/2007  . GLAUCOMA 09/07/2007  . History of duodenal ulcer 09/07/2007  . BPH (benign prostatic hyperplasia) 09/07/2007  . OSTEOARTHRITIS 09/07/2007   PCP:  Jinny Sanders, MD Pharmacy:   [place], [place] - [address] [address] Phone: [phone] Fax: [phone]     Social Determinants of Health (SDOH) Interventions    Readmission Risk Interventions No flowsheet data found.

## 2019-10-10 NOTE — Progress Notes (Signed)
Occupational Therapy Treatment Patient Details Name: Dustin Harrison MRN: [id number] DOB: 06/04/1932 Today's Date: 10/10/2019    History of present illness Pt. is an 86 y.o. male who presented to ER secondary to progressive neck/back pain and R UE weakness after fall 2 days prior to admission; admitted for management of AKI and bacteremia (unknown source at this time).  C-spine, T-spine and L-spine imaging significant for prominent C3-4 and C4-5 impingement, moderate C2-3 and L4-5 impingement and mild C5-6, C7-T1, L3-4 and L5-S1 impingement   OT comments  Pt.  performed LUE AROM, and tolerated PROM to the end ROM.  Pt. education was provided about positioning of the RUE, and edema control of the right hand. Pt. Continues  To present with limited ROM, and requires assistance for ADLS. Pt. Was able to track in all panes, right and left peripheral vision was intact. Pt. However required cues to initiate scanning to the left within his environment. No additional cuing was required. Pt. Continues to  benefit from OT services for ADL training, neuromuscular re-ed, there. Ex,  A/E training, and pt. Education about home modification, and DME. Discharge disposition continues to be appropriate.    Follow Up Recommendations  CIR    Equipment Recommendations       Recommendations for Other Services      Precautions / Restrictions Precautions Precautions: Fall Precaution Comments: NPO Restrictions Weight Bearing Restrictions: No       Mobility Bed Mobility Overal bed mobility: Needs Assistance Bed Mobility: Supine to Sit       Sit to supine: Min guard      Transfers Overall transfer level: Needs assistance Equipment used: 1 person hand held assist Transfers: Sit to/from Stand Sit to Stand: Min assist;Mod assist              Balance                                           ADL either performed or assessed with clinical judgement   ADL Overall ADL's : Needs  assistance/impaired Eating/Feeding: NPO   Grooming: Minimal assistance   Upper Body Bathing: Moderate assistance   Lower Body Bathing: Maximal assistance   Upper Body Dressing : Minimal assistance;Moderate assistance   Lower Body Dressing: Maximal assistance   T          Vision Baseline Vision/History: Wears glasses Wears Glasses: At all times Patient Visual Report: No change from baseline Vision Assessment?: No apparent visual deficits Additional Comments: Pt. is able to track in all planes, intact right, and left peripheral vision   Perception     Praxis      Cognition Arousal/Alertness: Awake/alert Behavior During Therapy: WFL for tasks assessed/performed Overall Cognitive Status: Within Functional Limits for tasks assessed                                          Exercises     Shoulder Instructions       General Comments      Pertinent Vitals/ Pain       Pain Assessment: 0-10 Pain Score: 5  Pain Location: R hand Pain Descriptors / Indicators: Aching;Sore Pain Intervention(s): Limited activity within patient's tolerance  Home Living           Entrance Stairs-Number  of Steps: from primary entrance - garage 2 steps w/ L rail and R cabinet, 3 steps from porch                       Additional Comments: Resident of [employer]      Prior Functioning/Environment          Comments: Mod indep with SPC for ADLs, household and community mobilization; walks daily; denies additional fall history. Pt indep with ADL, IADL, driving, and caregiver for wife (min assist for basic ADL and mobility)   Frequency  Min 3X/week        Progress Toward Goals  OT Goals(current goals can now be found in the care plan section)  Progress towards OT goals: Progressing toward goals  Acute Rehab OT Goals OT Goal Formulation: With patient Time For Goal Achievement: 10/23/19 Potential to Achieve Goals: Good  Plan       Co-evaluation                 AM-PAC OT "6 Clicks" Daily Activity     Outcome Measure   Help from another person eating meals?: A Little Help from another person taking care of personal grooming?: A Little Help from another person toileting, which includes using toliet, bedpan, or urinal?: A Little Help from another person bathing (including washing, rinsing, drying)?: A Lot Help from another person to put on and taking off regular upper body clothing?: A Lot Help from another person to put on and taking off regular lower body clothing?: A Lot 6 Click Score: 15    End of Session Equipment Utilized During Treatment: Gait belt  OT Visit Diagnosis: Other abnormalities of gait and mobility (R26.89);History of falling (Z91.81);Pain Pain - Right/Left: Right Pain - part of body: Hand   Activity Tolerance Patient tolerated treatment well   Patient Left in bed;with bed alarm set;with nursing/sitter in room   Nurse Communication Other (comment)        [id number] OT Time Calculation (min): 19 min  Charges: OT General Charges $OT Visit: 1 Visit OT Treatments $Self Care/Home Management : 8-22 mins  Harrel Carina, MS, OTR/L  Harrel Carina 10/10/2019, 1:32 PM

## 2019-10-10 NOTE — Progress Notes (Signed)
Subjective: more awake and feels stronger today    Past Medical History:  Diagnosis Date  . Abnormal glucose   . Arthritis   . Baker's cyst of knee    left  . BPH (benign prostatic hypertrophy)   . Cough    because of "tight" esophagus  . Diabetes mellitus   . Glaucoma   . HOH (hard of hearing)   . Hypertension   . Pheochromocytoma of right adrenal gland   . Ulcer   . Wears hearing aid    bilateral    Past Surgical History:  Procedure Laterality Date  . adrenaletomy    . CATARACT EXTRACTION W/PHACO Left 10/08/2015   Procedure: CATARACT EXTRACTION PHACO AND INTRAOCULAR LENS PLACEMENT ([place]) left eye;  Surgeon: Leandrew Koyanagi, MD;  Location: [facility];  Service: Ophthalmology;  Laterality: Left;  MALYUGIN  . HERNIA REPAIR    . SQUAMOUS CELL CARCINOMA EXCISION  03-2006   left ear  . TONSILLECTOMY      Family History  Problem Relation Age of Onset  . Alcohol abuse Mother   . Coronary artery disease Mother   . Brain cancer Father        tumor  . Diabetes Brother     Social History:  reports that he has never smoked. He has never used smokeless tobacco. He reports current alcohol use of about 7.0 standard drinks of alcohol per week. He reports that he does not use drugs.  No Known Allergies  Medications: I have reviewed the patient's current medications.  ROS: Not obtained as confused   Physical Examination: Blood pressure (!) 150/108, pulse 95, temperature 98.5 F (36.9 C), temperature source Oral, resp. rate 20, height 5\' 11"  (1.803 m), weight 87.1 kg, SpO2 92 %.    Neurological Examination   Mental Status: Alert to name . Cranial Nerves: II: Discs flat bilaterally; Visual fields grossly normal, pupils equal, round, reactive to light and accommodation III,IV, VI: ptosis not present, extra-ocular motions intact bilaterally V,VII: smile symmetric, facial light touch sensation normal bilaterally XI: bilateral shoulder shrug XII: mild deviation to  R side.  Motor: Generalized weakness bilaterally upper and lower extremities.   Increased tone b/l upper extremities Sensory: Pinprick and light touch intact throughout, bilaterally Deep Tendon Reflexes: 1+ and symmetric throughout Plantars: Right: downgoing   Left: downgoing Cerebellar: Not tested Gait: normal gait and station      Laboratory Studies:   Basic Metabolic Panel: Recent Labs  Lab 10/07/19 0727 10/08/19 0511 10/09/19 0033  NA 134* 135 135  K 5.0 4.2 4.3  CL 102 104 105  CO2 23 23 24   GLUCOSE 140* 148* 164*  BUN 66* 48* 42*  CREATININE 2.44* 1.61* 1.22  CALCIUM 8.6* 8.3* 8.0*    Liver Function Tests: No results for input(s): AST, ALT, ALKPHOS, BILITOT, PROT, ALBUMIN in the last 168 hours. No results for input(s): LIPASE, AMYLASE in the last 168 hours. No results for input(s): AMMONIA in the last 168 hours.  CBC: Recent Labs  Lab 10/07/19 0727 10/08/19 0511 10/09/19 0033 10/10/19 0415  WBC 28.1* 23.5* 21.0* 15.5*  NEUTROABS 24.3*  --   --   --   HGB 14.4 15.1 15.1 16.7  HCT 43.4 44.9 44.7 48.2  MCV 93.3 91.6 90.9 89.3  PLT 121* 99* 80* 74*    Cardiac Enzymes: Recent Labs  Lab 10/07/19 0727  CKTOTAL 305    BNP: Invalid input(s): POCBNP  CBG: Recent Labs  Lab 10/09/19 1209 10/09/19 1730 10/09/19  2052 10/10/19 0733 10/10/19 1202  [place]*    Microbiology: Results for orders placed or performed during the hospital encounter of 10/07/19  Urine culture     Status: None   Collection Time: 10/07/19  7:27 AM   Specimen: Urine, Random  Result Value Ref Range Status   Specimen Description   Final    URINE, RANDOM Performed at [facility], [address], [place], [place] [address]    Special Requests   Final    NONE Performed at [facility], [address], [place], [place] [address]    Culture   Final    NO GROWTH Performed at [facility], [place] [address] [place], [place]  [address]    Report Status 10/08/2019 FINAL  Final  Blood Culture (routine x 2)     Status: Abnormal   Collection Time: 10/07/19  8:31 AM   Specimen: BLOOD  Result Value Ref Range Status   Specimen Description   Final    BLOOD LAC Performed at [facility], [address] [place], [place] [address]    Special Requests   Final    BOTTLES DRAWN AEROBIC AND ANAEROBIC Blood Culture adequate volume Performed at [facility], [address] [place], [place] [address]    Culture  Setup Time   Final    GRAM POSITIVE COCCI IN BOTH AEROBIC AND ANAEROBIC BOTTLES CRITICAL RESULT CALLED TO, READ BACK BY AND VERIFIED WITH: ASAJAH DUNCAN ON 10/07/2019 AT 2035 TIK Performed at [facility], [address] [place], [place] [address]    Culture (A)  Final    GROUP B STREP(S.AGALACTIAE)ISOLATED SUSCEPTIBILITIES PERFORMED ON PREVIOUS CULTURE WITHIN THE LAST 5 DAYS. Performed at [facility], [place] [address] [place], [place] [address]    Report Status 10/09/2019 FINAL  Final  Blood Culture (routine x 2)     Status: Abnormal   Collection Time: 10/07/19  8:31 AM   Specimen: BLOOD  Result Value Ref Range Status   Specimen Description   Final    BLOOD LWRIST Performed at [facility], [address], [place], [place] [address]    Special Requests   Final    BOTTLES DRAWN AEROBIC AND ANAEROBIC Blood Culture adequate volume Performed at [facility], [address] [place], [place] [address]    Culture  Setup Time   Final    GRAM POSITIVE COCCI IN BOTH AEROBIC AND ANAEROBIC BOTTLES CRITICAL RESULT CALLED TO, READ BACK BY AND VERIFIED WITH: ASAJAH DUNCAN ON 10/07/2019 AT 2035 TIK Performed at [facility], [place] [address], [place], [place] [address]    Culture GROUP B STREP(S.AGALACTIAE)ISOLATED (A)  Final   Report Status 10/09/2019 FINAL  Final   Organism ID, Bacteria GROUP B STREP(S.AGALACTIAE)ISOLATED  Final      Susceptibility    Group b strep(s.agalactiae)isolated - MIC*    CLINDAMYCIN <=0.25 SENSITIVE Sensitive     AMPICILLIN <=0.25 SENSITIVE Sensitive     ERYTHROMYCIN <=0.12 SENSITIVE Sensitive     VANCOMYCIN 0.5 SENSITIVE Sensitive     CEFTRIAXONE <=0.12 SENSITIVE Sensitive     LEVOFLOXACIN 1 SENSITIVE Sensitive     PENICILLIN Value in next row Sensitive      SENSITIVE0.06    * GROUP B STREP(S.AGALACTIAE)ISOLATED  Blood Culture ID Panel (Reflexed)     Status: Abnormal   Collection Time: 10/07/19  8:31 AM  Result Value Ref Range Status   Enterococcus  species NOT DETECTED NOT DETECTED Final   Listeria monocytogenes NOT DETECTED NOT DETECTED Final   Staphylococcus species NOT DETECTED NOT DETECTED Final   Staphylococcus aureus (BCID) NOT DETECTED NOT DETECTED Final   Streptococcus species DETECTED (A) NOT DETECTED Final    Comment: CRITICAL RESULT CALLED TO, READ BACK BY AND VERIFIED WITH: ASAJAH DUNCAN ON 10/07/2019 AT 2035 TIK    Streptococcus agalactiae DETECTED (A) NOT DETECTED Final    Comment: CRITICAL RESULT CALLED TO, READ BACK BY AND VERIFIED WITH: ASAJAH DUNCAN ON 10/07/2019 AT 2035 TIK    Streptococcus pneumoniae NOT DETECTED NOT DETECTED Final   Streptococcus pyogenes NOT DETECTED NOT DETECTED Final   Acinetobacter baumannii NOT DETECTED NOT DETECTED Final   Enterobacteriaceae species NOT DETECTED NOT DETECTED Final   Enterobacter cloacae complex NOT DETECTED NOT DETECTED Final   Escherichia coli NOT DETECTED NOT DETECTED Final   Klebsiella oxytoca NOT DETECTED NOT DETECTED Final   Klebsiella pneumoniae NOT DETECTED NOT DETECTED Final   Proteus species NOT DETECTED NOT DETECTED Final   Serratia marcescens NOT DETECTED NOT DETECTED Final   Haemophilus influenzae NOT DETECTED NOT DETECTED Final   Neisseria meningitidis NOT DETECTED NOT DETECTED Final   Pseudomonas aeruginosa NOT DETECTED NOT DETECTED Final   Candida albicans NOT DETECTED NOT DETECTED Final   Candida glabrata NOT DETECTED NOT  DETECTED Final   Candida krusei NOT DETECTED NOT DETECTED Final   Candida parapsilosis NOT DETECTED NOT DETECTED Final   Candida tropicalis NOT DETECTED NOT DETECTED Final    Comment: Performed at [facility], [address] [place], [place] [address]  Respiratory Panel by RT PCR (Flu A&B, Covid) - Nasopharyngeal Swab     Status: None   Collection Time: 10/07/19 12:27 PM   Specimen: Nasopharyngeal Swab  Result Value Ref Range Status   SARS Coronavirus 2 by RT PCR NEGATIVE NEGATIVE Final    Comment: (NOTE) SARS-CoV-2 target nucleic acids are NOT DETECTED. The SARS-CoV-2 RNA is generally detectable in upper respiratoy specimens during the acute phase of infection. The lowest concentration of SARS-CoV-2 viral copies this assay can detect is 131 copies/mL. A negative result does not preclude SARS-Cov-2 infection and should not be used as the sole basis for treatment or other patient management decisions. A negative result may occur with  improper specimen collection/handling, submission of specimen other than nasopharyngeal swab, presence of viral mutation(s) within the areas targeted by this assay, and inadequate number of viral copies (<131 copies/mL). A negative result must be combined with clinical observations, patient history, and epidemiological information. The expected result is Negative. Fact Sheet for Patients:  PinkCheek.be Fact Sheet for Healthcare Providers:  GravelBags.it This test is not yet ap proved or cleared by the [place] FDA and  has been authorized for detection and/or diagnosis of SARS-CoV-2 by FDA under an Emergency Use Authorization (EUA). This EUA will remain  in effect (meaning this test can be used) for the duration of the COVID-19 declaration under Section 564(b)(1) of the Act, 21 U.S.C. section 360bbb-3(b)(1), unless the authorization is terminated or revoked sooner.    Influenza  A by PCR NEGATIVE NEGATIVE Final   Influenza B by PCR NEGATIVE NEGATIVE Final    Comment: (NOTE) The Xpert Xpress SARS-CoV-2/FLU/RSV assay is intended as an aid in  the diagnosis of influenza from Nasopharyngeal swab specimens and  should not be used as a sole basis for treatment. Nasal washings and  aspirates are unacceptable for Xpert Xpress SARS-CoV-2/FLU/RSV  testing. Fact Sheet  for Patients: PinkCheek.be Fact Sheet for Healthcare Providers: GravelBags.it This test is not yet approved or cleared by the [place] FDA and  has been authorized for detection and/or diagnosis of SARS-CoV-2 by  FDA under an Emergency Use Authorization (EUA). This EUA will remain  in effect (meaning this test can be used) for the duration of the  Covid-19 declaration under Section 564(b)(1) of the Act, 21  U.S.C. section 360bbb-3(b)(1), unless the authorization is  terminated or revoked. Performed at [facility], [address] [place], [place] [address]   Culture, blood (Routine X 2) w Reflex to ID Panel     Status: None (Preliminary result)   Collection Time: 10/09/19 12:31 AM   Specimen: BLOOD LEFT WRIST  Result Value Ref Range Status   Specimen Description BLOOD LEFT WRIST  Final   Special Requests   Final    BOTTLES DRAWN AEROBIC AND ANAEROBIC Blood Culture adequate volume   Culture   Final    NO GROWTH 1 DAY Performed at [facility], [address] [place], [place] [address]    Report Status PENDING  Incomplete  Culture, blood (Routine X 2) w Reflex to ID Panel     Status: None (Preliminary result)   Collection Time: 10/09/19 12:33 AM   Specimen: BLOOD LEFT HAND  Result Value Ref Range Status   Specimen Description BLOOD LEFT HAND  Final   Special Requests   Final    BOTTLES DRAWN AEROBIC AND ANAEROBIC Blood Culture adequate volume   Culture   Final    NO GROWTH 1 DAY Performed at [facility],  [address], [place], [place] [address]    Report Status PENDING  Incomplete    Coagulation Studies: No results for input(s): LABPROT, INR in the last 72 hours.  Urinalysis:  Recent Labs  Lab 10/07/19 0727  COLORURINE YELLOW*  LABSPEC 1.020  PHURINE 5.0  GLUCOSEU NEGATIVE  HGBUR MODERATE*  BILIRUBINUR NEGATIVE  KETONESUR NEGATIVE  PROTEINUR 100*  NITRITE NEGATIVE  LEUKOCYTESUR NEGATIVE    Lipid Panel:     Component Value Date/Time   CHOL 173 11/09/2018 0849   TRIG 47.0 11/09/2018 0849   HDL 86.10 11/09/2018 0849   CHOLHDL 2 11/09/2018 0849   VLDL 9.4 11/09/2018 0849   LDLCALC 77 11/09/2018 0849    HgbA1C:  Lab Results  Component Value Date   HGBA1C 6.8 (H) 10/07/2019    Urine Drug Screen:  No results found for: LABOPIA, COCAINSCRNUR, LABBENZ, AMPHETMU, THCU, LABBARB  Alcohol Level: No results for input(s): ETH in the last 168 hours.  Other results: EKG: normal EKG, normal sinus rhythm, unchanged from previous tracings.  Imaging: DG Wrist Complete Right  Result Date: 10/08/2019 CLINICAL DATA:  Right wrist pain. EXAM: RIGHT WRIST - COMPLETE 3+ VIEW COMPARISON:  None. FINDINGS: There is no acute displaced fracture or dislocation. There are degenerative changes of the metacarpophalangeal joints. There are degenerative changes of the radiocarpal joint. There are degenerative changes of the scaphoid trapezial joint. IMPRESSION: Negative. Electronically Signed   By: Constance Holster M.D.   On: 10/08/2019 17:11   MR BRAIN WO CONTRAST  Result Date: 10/09/2019 CLINICAL DATA:  Right upper extremity weakness EXAM: MRI HEAD WITHOUT CONTRAST TECHNIQUE: Multiplanar, multiecho pulse sequences of the brain and surrounding structures were obtained without intravenous contrast. COMPARISON:  None. FINDINGS: There is substantial artifact related to left cochlear implant. Majority of the brain is obscured on diffusion weighted imaging. Susceptibility weighted imaging is  nondiagnostic.  Ventricles appear stable in size. Probable chronic microvascular ischemic changes. IMPRESSION: Substantial artifact related to cochlear implant. Acute infarction causing reported symptoms cannot be excluded. Electronically Signed   By: Macy Mis M.D.   On: 10/09/2019 11:39   ECHOCARDIOGRAM COMPLETE  Result Date: 10/08/2019    ECHOCARDIOGRAM REPORT   Patient Name:   Dustin Harrison Date of Exam: 10/08/2019 Medical Rec #:  [id number]         Height:       71.0 in Accession #:    [id number]        Weight:       192.0 lb Date of Birth:  30-Nov-1931          BSA:          2.072 m Patient Age:    58 years          BP:           145/96 mmHg Patient Gender: M                 HR:           108 bpm. Exam Location:  [facility] Procedure: 2D Echo, Color Doppler and Cardiac Doppler Indications:     Bacteremia 790.7  History:         Patient has no prior history of Echocardiogram examinations.                  Risk Factors:Hypertension and Diabetes.  Sonographer:     Sherrie Sport RDCS (AE) Referring Phys:  [id number] [facility] Diagnosing Phys: Kathlyn Sacramento MD  Sonographer Comments: Suboptimal apical window. IMPRESSIONS  1. Left ventricular ejection fraction, by estimation, is 25 to 30%. The left ventricle has severely decreased function. The left ventricle has no regional wall motion abnormalities. There is moderate left ventricular hypertrophy. Left ventricular diastolic parameters are consistent with Grade I diastolic dysfunction (impaired relaxation). There is severe akinesis of the left ventricular, mid-apical anteroseptal wall, anterolateral wall, anterior segment, apical segment and inferoseptal wall. Wall  motion abnormalities are suggestive of stress induced cardiomyopathy vs. LAD infarct.  2. Right ventricular systolic function is normal. The right ventricular size is normal. There is mildly elevated pulmonary artery systolic pressure.  3. Left atrial size was mildly dilated.  4. The mitral valve is normal  in structure. No evidence of mitral valve regurgitation. No evidence of mitral stenosis.  5. The aortic valve is normal in structure. Aortic valve regurgitation is not visualized. Mild to moderate aortic valve sclerosis/calcification is present, without any evidence of aortic stenosis.  6. The inferior vena cava is normal in size with greater than 50% respiratory variability, suggesting right atrial pressure of 3 mmHg.  7. No clear vegetations but suboptimal study. FINDINGS  Left Ventricle: Left ventricular ejection fraction, by estimation, is 25 to 30%. The left ventricle has severely decreased function. The left ventricle has no regional wall motion abnormalities. Severe akinesis of the left ventricular, mid-apical anteroseptal wall, anterolateral wall, anterior segment, apical segment and inferoseptal wall. The left ventricular internal cavity size was normal in size. There is moderate left ventricular hypertrophy. Left ventricular diastolic parameters are consistent with Grade I diastolic dysfunction (impaired relaxation). Right Ventricle: The right ventricular size is normal. No increase in right ventricular wall thickness. Right ventricular systolic function is normal. There is mildly elevated pulmonary artery systolic pressure. The tricuspid regurgitant velocity is 2.74  m/s, and with an assumed right atrial pressure of 10 mmHg, the estimated right  ventricular systolic pressure is [id number] mmHg. Left Atrium: Left atrial size was mildly dilated. Right Atrium: Right atrial size was normal in size. Pericardium: There is no evidence of pericardial effusion. Mitral Valve: The mitral valve is normal in structure. Normal mobility of the mitral valve leaflets. No evidence of mitral valve regurgitation. No evidence of mitral valve stenosis. Tricuspid Valve: The tricuspid valve is normal in structure. Tricuspid valve regurgitation is mild . No evidence of tricuspid stenosis. Aortic Valve: The aortic valve is normal in  structure. Aortic valve regurgitation is not visualized. Mild to moderate aortic valve sclerosis/calcification is present, without any evidence of aortic stenosis. Aortic valve mean gradient measures 3.0 mmHg. Aortic valve peak gradient measures 4.9 mmHg. Aortic valve area, by VTI measures 3.26 cm. Pulmonic Valve: The pulmonic valve was normal in structure. Pulmonic valve regurgitation is not visualized. No evidence of pulmonic stenosis. Aorta: The aortic root is normal in size and structure. Venous: The inferior vena cava is normal in size with greater than 50% respiratory variability, suggesting right atrial pressure of 3 mmHg. IAS/Shunts: No atrial level shunt detected by color flow Doppler.  LEFT VENTRICLE PLAX 2D LVIDd:         3.67 cm      Diastology LVIDs:         2.79 cm      LV e' lateral:   3.92 cm/s LV PW:         1.52 cm      LV E/e' lateral: 10.5 LV IVS:        1.45 cm      LV e' medial:    4.79 cm/s LVOT diam:     2.25 cm      LV E/e' medial:  8.6 LV SV:         53 LV SV Index:   26 LVOT Area:     3.98 cm  LV Volumes (MOD) LV vol d, MOD A2C: 124.0 ml LV vol d, MOD A4C: 65.1 ml LV vol s, MOD A2C: 83.1 ml LV vol s, MOD A4C: 61.5 ml LV SV MOD A2C:     40.9 ml LV SV MOD A4C:     65.1 ml LV SV MOD BP:      25.6 ml RIGHT VENTRICLE RV Basal diam:  4.01 cm RV S prime:     12.90 cm/s TAPSE (M-mode): 3.1 cm LEFT ATRIUM             Index       RIGHT ATRIUM           Index LA diam:        3.20 cm 1.54 cm/m  RA Area:     13.30 cm LA Vol (A2C):   36.6 ml 17.66 ml/m RA Volume:   32.70 ml  15.78 ml/m LA Vol (A4C):   58.2 ml 28.08 ml/m LA Biplane Vol: 45.7 ml 22.05 ml/m  AORTIC VALVE                   PULMONIC VALVE AV Area (Vmax):    3.08 cm    PV Vmax:        0.48 m/s AV Area (Vmean):   3.20 cm    PV Peak grad:   0.9 mmHg AV Area (VTI):     3.26 cm    RVOT Peak grad: 1 mmHg AV Vmax:           110.50 cm/s AV Vmean:  84.500 cm/s AV VTI:            0.162 m AV Peak Grad:      4.9 mmHg AV Mean Grad:       3.0 mmHg LVOT Vmax:         85.50 cm/s LVOT Vmean:        68.100 cm/s LVOT VTI:          0.133 m LVOT/AV VTI ratio: 0.82  AORTA Ao Root diam: 3.20 cm MITRAL VALVE               TRICUSPID VALVE MV Area (PHT): 5.79 cm    TR Peak grad:   30.0 mmHg MV Decel Time: 131 msec    TR Vmax:        274.00 cm/s MV E velocity: 41.10 cm/s MV A velocity: 63.40 cm/s  SHUNTS MV E/A ratio:  0.65        Systemic VTI:  0.13 m                            Systemic Diam: 2.25 cm Kathlyn Sacramento MD Electronically signed by Kathlyn Sacramento MD Signature Date/Time: 10/08/2019/3:30:04 PM    Final      Assessment/Plan:  86 y.o. male  with medical history significant for hypertension, diabetes mellitus and chronic low back pain who presents to the emergency room for evaluation of worsening low back pain and generalized weakness.  Patient also complains of pain in his neck which is new.  Per EMS patient has had 2 falls in the past week  but patient is unable to recall the details of the fall, he was unable to state if he hit his head or his neck when he fell. He denies having any fever, cough, chest pain, shortness of breath, abdominal pain, nausea, vomiting, diarrhea or any urinary symptoms. He has been having progressive swallowing difficulty and significant findings on MRI C spine that could be associated with delayed swallowing.    - MRI reviewed and there is good amount of motion artifact but I don't see anything new on there or acute - probably degenerative, progressive disease and deconditioning - I didn't appreciate any focal weakness on exam except generalized weakness and maybe some R tongue deviation but he appears to have improved since yesterda - d/w daughter and wife at bedside - Neurosurgery out pt follow up for C spine even though I am not sure he is a good candidate.   10/10/2019, 12:20 PM

## 2019-10-10 NOTE — Progress Notes (Addendum)
Inpatient Rehab Admissions:  Inpatient Rehab Consult received.  I spoke with patient on the phone for rehabilitation assessment and to discuss goals and expectations of an inpatient rehab admission. Based on current therapy notes, PLOF, and conversation I had with this patient and his family, feel he is an appropriate candidate for CIR at this time. Pt and his family would like to pursue CIR if insurance approves.   Addendum: 2:34PM: Spoke with Dr. Roosevelt Locks regarding further medical workup. Pt is awaiting possible TEE and ENT consult with pt still needing a few more days of acute stay. AC will follow along and will begin insurance authorization process for possible admit, once appropriate, and IF pt continues to qualify from a functional standpoint.   Please call if questions.   Raechel Ache, OTR/L  Rehab Admissions Coordinator  [phone] 10/10/2019 2:21 PM

## 2019-10-10 NOTE — Consult Note (Signed)
Dustin Harrison, Dustin Harrison:[id number] 01-07-1932 Dustin [name], MD  Reason for Consult: Dysphagia Requesting Physician: Sharen Hones, MD Consulting Physician: Dustin Harrison  HPI: This 86 y.o. year old male was admitted on 10/07/2019 for Generalized weakness [R53.1] AKI (acute kidney injury) ([place]) [N17.9] Acute kidney injury ([place]) [N17.9] Chronic midline low back pain without sciatica [M54.5, G89.29] Leukocytosis, unspecified type [D72.829]. Long h/o cough and throat clearing and was told by a prior ENT he has a "tight esophagus", but has had new onset of hoarseness/breathiness and swallowing difficulty. Swallowing study indicated poor phsryngeal swallowing phase with aspiration risk and delayed cough reflex. They recommended he be left NPO. EGD was unremarkable apparently. He does have some C-spine impingment on the upper esophagus noted on the swallowing study.  Allergies: No Known Allergies  Medications:  Medications Prior to Admission  Medication Sig Dispense Refill  . atorvastatin (LIPITOR) 20 MG tablet TAKE ONE TABLET EVERY DAY 90 tablet 1  . finasteride (PROSCAR) 5 MG tablet TAKE ONE TABLET EVERY DAY 90 tablet 1  . losartan (COZAAR) 100 MG tablet TAKE ONE TABLET EVERY DAY 90 tablet 1  . Polyethyl Glycol-Propyl Glycol (SYSTANE) 0.4-0.3 % GEL ophthalmic gel Place 1 application into both eyes.    . Psyllium (METAMUCIL FIBER PO) Take 2 Scoops by mouth daily.    . tamsulosin (FLOMAX) 0.4 MG CAPS capsule TAKE 2 CAPSULES EVERY DAY 180 capsule 3  . timolol (TIMOPTIC) 0.5 % ophthalmic solution Place 1 drop into both eyes 2 (two) times daily.     Marland Kitchen acetaminophen (TYLENOL) 500 MG tablet Take 500 mg by mouth daily as needed.    .  Current Facility-Administered Medications  Medication Dose Route Frequency Provider Last Rate Last Admin  . carvedilol (COREG) tablet 6.25 mg  6.25 mg Oral BID WC Agbor-Etang, Aaron Edelman, MD      . cyclobenzaprine (FLEXERIL) tablet 5 mg  5 mg Oral TID Agbata, Tochukwu, MD   5 mg at  10/08/19 1021  . finasteride (PROSCAR) tablet 5 mg  5 mg Oral Daily Agbata, Tochukwu, MD   Stopped at 10/10/19 1022  . HYDROcodone-acetaminophen (NORCO/VICODIN) 5-325 MG per tablet 1 tablet  1 tablet Oral Q6H PRN Agbata, Tochukwu, MD   1 tablet at 10/10/19 1248  . insulin aspart (novoLOG) injection 0-15 Units  0-15 Units Subcutaneous TID WC Agbata, Tochukwu, MD   3 Units at 10/10/19 1520  . insulin aspart (novoLOG) injection 4 Units  4 Units Subcutaneous TID WC Agbata, Tochukwu, MD   4 Units at 10/10/19 1520  . labetalol (NORMODYNE) injection 10 mg  10 mg Intravenous Q6H PRN Agbata, Tochukwu, MD   10 mg at 10/09/19 2147  . lidocaine (LIDODERM) 5 % 1 patch  1 patch Transdermal Q24H Agbata, Tochukwu, MD   1 patch at 10/10/19 1517  . losartan (COZAAR) tablet 12.5 mg  12.5 mg Oral Daily End, Christopher, MD   Stopped at 10/10/19 1022  . morphine 2 MG/ML injection 2 mg  2 mg Intravenous Q4H PRN Lang Snow, NP   2 mg at 10/09/19 2108  . ondansetron (ZOFRAN) tablet 4 mg  4 mg Oral Q6H PRN Agbata, Tochukwu, MD       Or  . ondansetron (ZOFRAN) injection 4 mg  4 mg Intravenous Q6H PRN Agbata, Tochukwu, MD      . penicillin G potassium 12 Million Units in dextrose 5 % 500 mL continuous infusion  12 Million Units Intravenous Q12H Berton Mount, RPH 41.7 mL/hr at 10/10/19 1149 12  Million Units at 10/10/19 1149  . psyllium (HYDROCIL/METAMUCIL) packet 1 packet  1 packet Oral Daily Agbata, Tochukwu, MD   Stopped at 10/08/19 1030  . tamsulosin (FLOMAX) capsule 0.8 mg  0.8 mg Oral Daily Agbata, Tochukwu, MD   Stopped at 10/10/19 1022  . timolol (TIMOPTIC) 0.5 % ophthalmic solution 1 drop  1 drop Both Eyes BID Agbata, Tochukwu, MD   1 drop at 10/10/19 1050    PMH:  Past Medical History:  Diagnosis Date  . Abnormal glucose   . Arthritis   . Baker's cyst of knee    left  . BPH (benign prostatic hypertrophy)   . Cough    because of "tight" esophagus  . Diabetes mellitus   . Glaucoma   . HOH  (hard of hearing)   . Hypertension   . Pheochromocytoma of right adrenal gland   . Ulcer   . Wears hearing aid    bilateral    Fam Hx:  Family History  Problem Relation Age of Onset  . Alcohol abuse Mother   . Coronary artery disease Mother   . Brain cancer Father        tumor  . Diabetes Brother     Soc Hx:  Social History   Socioeconomic History  . Marital status: Married    Spouse name: Not on file  . Number of children: Not on file  . Years of education: Not on file  . Highest education level: Not on file  Occupational History  . Occupation: chaplain  Tobacco Use  . Smoking status: Never Smoker  . Smokeless tobacco: Never Used  Substance and Sexual Activity  . Alcohol use: Yes    Alcohol/week: 7.0 standard drinks    Types: 7 Glasses of wine per week  . Drug use: No  . Sexual activity: Not Currently  Other Topics Concern  . Not on file  Social History Narrative   Regular exercise- yes- 3-4 times a week treadmill/walk   Diet: fruits and veggies,water   Living will, HCPOA (wife) , full code (reviewed 41)   Social Determinants of Health   Financial Resource Strain:   . Difficulty of Paying Living Expenses:   Food Insecurity:   . Worried About Charity fundraiser in the Last Year:   . Arboriculturist in the Last Year:   Transportation Needs:   . Film/video editor (Medical):   Marland Kitchen Lack of Transportation (Non-Medical):   Physical Activity:   . Days of Exercise per Week:   . Minutes of Exercise per Session:   Stress:   . Feeling of Stress :   Social Connections:   . Frequency of Communication with Friends and Family:   . Frequency of Social Gatherings with Friends and Family:   . Attends Religious Services:   . Active Member of Clubs or Organizations:   . Attends Archivist Meetings:   Marland Kitchen Marital Status:   Intimate Partner Violence:   . Fear of Current or Ex-Partner:   . Emotionally Abused:   Marland Kitchen Physically Abused:   . Sexually Abused:      PSH:  Past Surgical History:  Procedure Laterality Date  . adrenaletomy    . CATARACT EXTRACTION W/PHACO Left 10/08/2015   Procedure: CATARACT EXTRACTION PHACO AND INTRAOCULAR LENS PLACEMENT ([place]) left eye;  Surgeon: Leandrew Koyanagi, MD;  Location: [facility];  Service: Ophthalmology;  Laterality: Left;  MALYUGIN  . HERNIA REPAIR    . SQUAMOUS CELL CARCINOMA  EXCISION  03-2006   left ear  . TONSILLECTOMY    . Procedures since admission: No admission procedures for hospital encounter.  ROS: Review of systems normal other than 12 systems except per HPI.  PHYSICAL EXAM Vitals:  Vitals:   10/10/19 0446 10/10/19 1249  BP: (!) 150/108 (!) 157/97  Pulse: 95 75  Resp: 20 18  Temp: 98.5 F (36.9 C) 97.6 F (36.4 C)  SpO2: 92% 97%  . General: Well-developed, Well-nourished in no acute distress Mood: Mood and affect well adjusted, pleasant and cooperative. Orientation: Grossly alert and oriented. Vocal Quality: Hoarse breathy voice with low volume. Communicates verbally. head and Face: NCAT. No facial asymmetry. No visible skin lesions. No significant facial scars. No tenderness with sinus percussion. Facial strength normal and symmetric. Ears: External ears with normal landmarks, no lesions. External auditory canals free of infection, cerumen impaction or lesions. Tympanic membranes intact with good landmarks and normal mobility on pneumatic otoscopy. No middle ear effusion. Hearing: Speech reception grossly normal. Nose: External nose normal with midline dorsum and no lesions or deformity. Nasal Cavity reveals essentially midline septum with normal inferior turbinates. No significant mucosal congestion or erythema. Nasal secretions are minimal and clear. No polyps seen on anterior rhinoscopy. Oral Cavity/ Oropharynx: Lips are normal with no lesions. Teeth no frank dental caries. Gingiva healthy with no lesions or gingivitis. Oropharynx including tongue, buccal mucosa, floor  of mouth, hard and soft palate, uvula and posterior pharynx free of exudates, erythema or lesions with normal symmetry and hydration.  Indirect Laryngoscopy/Nasopharyngoscopy: Visualization of the larynx, hypopharynx and nasopharynx is not possible in this setting with routine examination. Neck: Supple and symmetric with no palpable masses, tenderness or crepitance. The trachea is midline. Thyroid gland is soft, nontender and symmetric with no masses or enlargement. Parotid and submandibular glands are soft, nontender and symmetric, without masses. Lymphatic: Cervical lymph nodes are without palpable lymphadenopathy or tenderness. Respiratory: Normal respiratory effort without labored breathing. Cardiovascular: Carotid pulse shows regular rate and rhythm Neurologic: Cranial Nerves II through XII are grossly intact, except for X on the right as noted in scope exam Eyes: Gaze and Ocular Motility are grossly normal. PERRLA. No visible nystagmus.  MEDICAL DECISION MAKING: Data Review:  Results for orders placed or performed during the hospital encounter of 10/07/19 (from the past 48 hour(s))  Glucose, capillary     Status: Abnormal   Collection Time: 10/08/19  9:24 PM  Result Value Ref Range   Glucose-Capillary 129 (H) 70 - 99 mg/dL    Comment: Glucose reference range applies only to samples taken after fasting for at least 8 hours.   Comment 1 Notify RN   Culture, blood (Routine X 2) w Reflex to ID Panel     Status: None (Preliminary result)   Collection Time: 10/09/19 12:31 AM   Specimen: BLOOD LEFT WRIST  Result Value Ref Range   Specimen Description BLOOD LEFT WRIST    Special Requests      BOTTLES DRAWN AEROBIC AND ANAEROBIC Blood Culture adequate volume   Culture      NO GROWTH 1 DAY Performed at [facility], [address] [place], [place] [address]    Report Status PENDING   Culture, blood (Routine X 2) w Reflex to ID Panel     Status: None (Preliminary result)    Collection Time: 10/09/19 12:33 AM   Specimen: BLOOD LEFT HAND  Result Value Ref Range   Specimen Description BLOOD LEFT HAND    Special  Requests      BOTTLES DRAWN AEROBIC AND ANAEROBIC Blood Culture adequate volume   Culture      NO GROWTH 1 DAY Performed at [facility], [address] [place], [place] [address]    Report Status PENDING   Basic metabolic panel     Status: Abnormal   Collection Time: 10/09/19 12:33 AM  Result Value Ref Range   Sodium 135 135 - 145 mmol/L   Potassium 4.3 3.5 - 5.1 mmol/L   Chloride 105 98 - 111 mmol/L   CO2 24 22 - 32 mmol/L   Glucose, Bld 164 (H) 70 - 99 mg/dL    Comment: Glucose reference range applies only to samples taken after fasting for at least 8 hours.   BUN 42 (H) 8 - 23 mg/dL   Creatinine, Ser 1.22 0.61 - 1.24 mg/dL   Calcium 8.0 (L) 8.9 - 10.3 mg/dL   GFR calc non Af Amer 54 (L) >60 mL/min   GFR calc Af Amer >60 >60 mL/min   Anion gap 6 5 - 15    Comment: Performed at [facility], [address] [place], [place] [address]  CBC     Status: Abnormal   Collection Time: 10/09/19 12:33 AM  Result Value Ref Range   WBC 21.0 (H) 4.0 - 10.5 K/uL   RBC 4.92 4.22 - 5.81 MIL/uL   Hemoglobin 15.1 13.0 - 17.0 g/dL   HCT 44.7 39.0 - 52.0 %   MCV 90.9 80.0 - 100.0 fL   MCH 30.7 26.0 - 34.0 pg   MCHC 33.8 30.0 - 36.0 g/dL   RDW 13.1 11.5 - 15.5 %   Platelets 80 (L) 150 - 400 K/uL    Comment: CONSISTENT WITH PREVIOUS RESULT Immature Platelet Fraction may be clinically indicated, consider ordering this additional test [id number]    nRBC 0.0 0.0 - 0.2 %    Comment: Performed at [facility], [address] [place], [place] [address]  Glucose, capillary     Status: Abnormal   Collection Time: 10/09/19  8:04 AM  Result Value Ref Range   Glucose-Capillary 142 (H) 70 - 99 mg/dL    Comment: Glucose reference range applies only to samples taken after fasting for at least 8 hours.  Glucose, capillary     Status:  Abnormal   Collection Time: 10/09/19 12:09 PM  Result Value Ref Range   Glucose-Capillary 129 (H) 70 - 99 mg/dL    Comment: Glucose reference range applies only to samples taken after fasting for at least 8 hours.  Glucose, capillary     Status: Abnormal   Collection Time: 10/09/19  5:30 PM  Result Value Ref Range   Glucose-Capillary 156 (H) 70 - 99 mg/dL    Comment: Glucose reference range applies only to samples taken after fasting for at least 8 hours.  Glucose, capillary     Status: Abnormal   Collection Time: 10/09/19  8:52 PM  Result Value Ref Range   Glucose-Capillary 147 (H) 70 - 99 mg/dL    Comment: Glucose reference range applies only to samples taken after fasting for at least 8 hours.   Comment 1 Notify RN   CBC     Status: Abnormal   Collection Time: 10/10/19  4:15 AM  Result Value Ref Range   WBC 15.5 (H) 4.0 - 10.5 K/uL   RBC 5.40 4.22 - 5.81 MIL/uL   Hemoglobin 16.7 13.0 - 17.0 g/dL   HCT 48.2 39.0 - 52.0 %  MCV 89.3 80.0 - 100.0 fL   MCH 30.9 26.0 - 34.0 pg   MCHC 34.6 30.0 - 36.0 g/dL   RDW 13.3 11.5 - 15.5 %   Platelets 74 (L) 150 - 400 K/uL    Comment: CONSISTENT WITH PREVIOUS RESULT Immature Platelet Fraction may be clinically indicated, consider ordering this additional test [id number]    nRBC 0.0 0.0 - 0.2 %    Comment: Performed at [facility], [address] [place], [place] [address]  Glucose, capillary     Status: Abnormal   Collection Time: 10/10/19  7:33 AM  Result Value Ref Range   Glucose-Capillary 167 (H) 70 - 99 mg/dL    Comment: Glucose reference range applies only to samples taken after fasting for at least 8 hours.  Glucose, capillary     Status: Abnormal   Collection Time: 10/10/19 12:02 PM  Result Value Ref Range   Glucose-Capillary 157 (H) 70 - 99 mg/dL    Comment: Glucose reference range applies only to samples taken after fasting for at least 8 hours.  Glucose, capillary     Status: None   Collection Time: 10/10/19   5:26 PM  Result Value Ref Range   Glucose-Capillary 91 70 - 99 mg/dL    Comment: Glucose reference range applies only to samples taken after fasting for at least 8 hours.  . MR BRAIN WO CONTRAST  Result Date: 10/09/2019 CLINICAL DATA:  Right upper extremity weakness EXAM: MRI HEAD WITHOUT CONTRAST TECHNIQUE: Multiplanar, multiecho pulse sequences of the brain and surrounding structures were obtained without intravenous contrast. COMPARISON:  None. FINDINGS: There is substantial artifact related to left cochlear implant. Majority of the brain is obscured on diffusion weighted imaging. Susceptibility weighted imaging is nondiagnostic. Ventricles appear stable in size. Probable chronic microvascular ischemic changes. IMPRESSION: Substantial artifact related to cochlear implant. Acute infarction causing reported symptoms cannot be excluded. Electronically Signed   By: Macy Mis M.D.   On: 10/09/2019 11:39  .   PROCEDURE: Procedure: Diagnostic Fiberoptic Nasolaryngoscopy Diagnosis: Hoarseness and dysphagia. Right true vocal cord paralysis Indications: Evaluate hoarseness and dysphagia Findings:Right true vocal cord paralysis. No mucosal lesions.  Description of Procedure: After discussing procedure and risks  (primarily nose bleed) with the patient, the nose was anesthetized with topical Lidocaine 4% and decongested with phenylephrine. A flexible fiberoptic scope was passed through the nasal cavity. The nasal cavity was inspected and the scope passed through the Nasopharynx to the region of the hypopharynx and larynx. The patient was instructed to phonate to assess vocal cord mobility. The tongue was extended to evaluate the tongue base completely. Valsalva was performed to insufflate the hypopharynx for improved examination. Findings are as noted above. The scope was withdrawn. The patient tolerated the procedure well.  ASSESSMENT: Right true vocal cord paralysis with dysphagia and right sided  weakness. I have reviewed his imaging. There is nothing else to explain the cord paralysis other than either idiopathic (such as a viral neuropathy) or a subhemispheric stroke. When considered in conjunction with the right sided weakness and the noted limitations of the MRI (due to the cochlear implant), I would certainly wonder about the latter, and would get input from Neurology and Radiology on this. Given the degree of aspiration risk noted, he should remain NPO.  PLAN: Right sided TVC paralysis and associated dysphagia. Discussed with Dr. Roosevelt Locks and he will get further input from Neurology/Radiology on the confidence in the suboptimal scan. Will need to be followed by Speech and  Swallowing to monitor for improvement, though a PEG would be a strong consideration given the uncertain prognosis for recovery.    Dustin [name], MD 10/10/2019 6:17 PM

## 2019-10-10 NOTE — Progress Notes (Signed)
Progress Note  Patient Name: Dustin Harrison Date of Encounter: 10/10/2019  Primary Cardiologist: Dr. Rockey Situ  Subjective   Patient states doing okay.  Will like something to drink. Currently npo due to risk of aspiration.  Wife and daughter at bedside  Inpatient Medications    Scheduled Meds: . carvedilol  6.25 mg Oral BID WC  . cyclobenzaprine  5 mg Oral TID  . finasteride  5 mg Oral Daily  . insulin aspart  0-15 Units Subcutaneous TID WC  . insulin aspart  4 Units Subcutaneous TID WC  . lidocaine  1 patch Transdermal Q24H  . losartan  12.5 mg Oral Daily  . psyllium  1 packet Oral Daily  . tamsulosin  0.8 mg Oral Daily  . timolol  1 drop Both Eyes BID   Continuous Infusions: . penicillin g continuous IV infusion 12 Million Units (10/10/19 1149)   PRN Meds: HYDROcodone-acetaminophen, labetalol, morphine injection, ondansetron **OR** ondansetron (ZOFRAN) IV   Vital Signs    Vitals:   10/09/19 0315 10/09/19 1211 10/09/19 1951 10/10/19 0446  BP: (!) 148/94 (!) 151/96 (!) 176/110 (!) 150/108  Pulse: 85 84 99 95  Resp: 20 16 20 20   Temp: 97.8 F (36.6 C) 98.3 F (36.8 C) 98.4 F (36.9 C) 98.5 F (36.9 C)  TempSrc: Oral  Oral Oral  SpO2: 97% 99% 96% 92%  Weight:      Height:        Intake/Output Summary (Last 24 hours) at 10/10/2019 1157 Last data filed at 10/10/2019 0741 Gross per 24 hour  Intake 264.8 ml  Output 1150 ml  Net -885.2 ml   Last 3 Weights 10/07/2019 05/25/2019 11/16/2018  Weight (lbs) 192 lb 199 lb 191 lb 8 oz  Weight (kg) 87.091 kg 90.266 kg 86.864 kg      Telemetry    Off tele currently - Personally Reviewed  ECG    No new tracing obtained   Physical Exam   GEN: No acute distress.   Neck: No JVD Cardiac: RRR, faint systolic murmur, rubs, or gallops.  Respiratory:  Decreased breath sounds at bases. GI: Soft, nontender, distended  MS: No edema; No deformity. Neuro:  Nonfocal  Psych: Normal affect   Labs    High Sensitivity  Troponin:  No results for input(s): TROPONINIHS in the last 720 hours.    Chemistry Recent Labs  Lab 10/07/19 0727 10/08/19 0511 10/09/19 0033  NA 134* 135 135  K 5.0 4.2 4.3  CL 102 104 105  CO2 23 23 24   GLUCOSE 140* 148* 164*  BUN 66* 48* 42*  CREATININE 2.44* 1.61* 1.22  CALCIUM 8.6* 8.3* 8.0*  GFRNONAA 23* 38* 54*  GFRAA 27* 45* >60  ANIONGAP 9 8 6      Hematology Recent Labs  Lab 10/08/19 0511 10/09/19 0033 10/10/19 0415  WBC 23.5* 21.0* 15.5*  RBC 4.90 4.92 5.40  HGB 15.1 15.1 16.7  HCT 44.9 44.7 48.2  MCV 91.6 90.9 89.3  MCH 30.8 30.7 30.9  MCHC 33.6 33.8 34.6  RDW 13.2 13.1 13.3  PLT 99* 80* 74*    BNPNo results for input(s): BNP, PROBNP in the last 168 hours.   DDimer No results for input(s): DDIMER in the last 168 hours.   Radiology    DG Wrist Complete Right  Result Date: 10/08/2019 CLINICAL DATA:  Right wrist pain. EXAM: RIGHT WRIST - COMPLETE 3+ VIEW COMPARISON:  None. FINDINGS: There is no acute displaced fracture or dislocation. There are degenerative  changes of the metacarpophalangeal joints. There are degenerative changes of the radiocarpal joint. There are degenerative changes of the scaphoid trapezial joint. IMPRESSION: Negative. Electronically Signed   By: Constance Holster M.D.   On: 10/08/2019 17:11   MR BRAIN WO CONTRAST  Result Date: 10/09/2019 CLINICAL DATA:  Right upper extremity weakness EXAM: MRI HEAD WITHOUT CONTRAST TECHNIQUE: Multiplanar, multiecho pulse sequences of the brain and surrounding structures were obtained without intravenous contrast. COMPARISON:  None. FINDINGS: There is substantial artifact related to left cochlear implant. Majority of the brain is obscured on diffusion weighted imaging. Susceptibility weighted imaging is nondiagnostic. Ventricles appear stable in size. Probable chronic microvascular ischemic changes. IMPRESSION: Substantial artifact related to cochlear implant. Acute infarction causing reported symptoms  cannot be excluded. Electronically Signed   By: Macy Mis M.D.   On: 10/09/2019 11:39   ECHOCARDIOGRAM COMPLETE  Result Date: 10/08/2019    ECHOCARDIOGRAM REPORT   Patient Name:   Dustin Harrison Date of Exam: 10/08/2019 Medical Rec #:  [id number]         Height:       71.0 in Accession #:    [id number]        Weight:       192.0 lb Date of Birth:  1932/01/21          BSA:          2.072 m Patient Age:    86 years          BP:           145/96 mmHg Patient Gender: M                 HR:           108 bpm. Exam Location:  [facility] Procedure: 2D Echo, Color Doppler and Cardiac Doppler Indications:     Bacteremia 790.7  History:         Patient has no prior history of Echocardiogram examinations.                  Risk Factors:Hypertension and Diabetes.  Sonographer:     Sherrie Sport RDCS (AE) Referring Phys:  [id number] [facility] Diagnosing Phys: Kathlyn Sacramento MD  Sonographer Comments: Suboptimal apical window. IMPRESSIONS  1. Left ventricular ejection fraction, by estimation, is 25 to 30%. The left ventricle has severely decreased function. The left ventricle has no regional wall motion abnormalities. There is moderate left ventricular hypertrophy. Left ventricular diastolic parameters are consistent with Grade I diastolic dysfunction (impaired relaxation). There is severe akinesis of the left ventricular, mid-apical anteroseptal wall, anterolateral wall, anterior segment, apical segment and inferoseptal wall. Wall  motion abnormalities are suggestive of stress induced cardiomyopathy vs. LAD infarct.  2. Right ventricular systolic function is normal. The right ventricular size is normal. There is mildly elevated pulmonary artery systolic pressure.  3. Left atrial size was mildly dilated.  4. The mitral valve is normal in structure. No evidence of mitral valve regurgitation. No evidence of mitral stenosis.  5. The aortic valve is normal in structure. Aortic valve regurgitation is not visualized. Mild to moderate aortic  valve sclerosis/calcification is present, without any evidence of aortic stenosis.  6. The inferior vena cava is normal in size with greater than 50% respiratory variability, suggesting right atrial pressure of 3 mmHg.  7. No clear vegetations but suboptimal study. FINDINGS  Left Ventricle: Left ventricular ejection fraction, by estimation, is 25 to 30%. The left ventricle has severely decreased function. The  left ventricle has no regional wall motion abnormalities. Severe akinesis of the left ventricular, mid-apical anteroseptal wall, anterolateral wall, anterior segment, apical segment and inferoseptal wall. The left ventricular internal cavity size was normal in size. There is moderate left ventricular hypertrophy. Left ventricular diastolic parameters are consistent with Grade I diastolic dysfunction (impaired relaxation). Right Ventricle: The right ventricular size is normal. No increase in right ventricular wall thickness. Right ventricular systolic function is normal. There is mildly elevated pulmonary artery systolic pressure. The tricuspid regurgitant velocity is 2.74  m/s, and with an assumed right atrial pressure of 10 mmHg, the estimated right ventricular systolic pressure is Q000111Q mmHg. Left Atrium: Left atrial size was mildly dilated. Right Atrium: Right atrial size was normal in size. Pericardium: There is no evidence of pericardial effusion. Mitral Valve: The mitral valve is normal in structure. Normal mobility of the mitral valve leaflets. No evidence of mitral valve regurgitation. No evidence of mitral valve stenosis. Tricuspid Valve: The tricuspid valve is normal in structure. Tricuspid valve regurgitation is mild . No evidence of tricuspid stenosis. Aortic Valve: The aortic valve is normal in structure. Aortic valve regurgitation is not visualized. Mild to moderate aortic valve sclerosis/calcification is present, without any evidence of aortic stenosis. Aortic valve mean gradient measures 3.0  mmHg. Aortic valve peak gradient measures 4.9 mmHg. Aortic valve area, by VTI measures 3.26 cm. Pulmonic Valve: The pulmonic valve was normal in structure. Pulmonic valve regurgitation is not visualized. No evidence of pulmonic stenosis. Aorta: The aortic root is normal in size and structure. Venous: The inferior vena cava is normal in size with greater than 50% respiratory variability, suggesting right atrial pressure of 3 mmHg. IAS/Shunts: No atrial level shunt detected by color flow Doppler.  LEFT VENTRICLE PLAX 2D LVIDd:         3.67 cm      Diastology LVIDs:         2.79 cm      LV e' lateral:   3.92 cm/s LV PW:         1.52 cm      LV E/e' lateral: 10.5 LV IVS:        1.45 cm      LV e' medial:    4.79 cm/s LVOT diam:     2.25 cm      LV E/e' medial:  8.6 LV SV:         53 LV SV Index:   26 LVOT Area:     3.98 cm  LV Volumes (MOD) LV vol d, MOD A2C: 124.0 ml LV vol d, MOD A4C: 65.1 ml LV vol s, MOD A2C: 83.1 ml LV vol s, MOD A4C: 61.5 ml LV SV MOD A2C:     40.9 ml LV SV MOD A4C:     65.1 ml LV SV MOD BP:      25.6 ml RIGHT VENTRICLE RV Basal diam:  4.01 cm RV S prime:     12.90 cm/s TAPSE (M-mode): 3.1 cm LEFT ATRIUM             Index       RIGHT ATRIUM           Index LA diam:        3.20 cm 1.54 cm/m  RA Area:     13.30 cm LA Vol (A2C):   36.6 ml 17.66 ml/m RA Volume:   32.70 ml  15.78 ml/m LA Vol (A4C):   58.2 ml 28.08 ml/m LA Biplane  Vol: 45.7 ml 22.05 ml/m  AORTIC VALVE                   PULMONIC VALVE AV Area (Vmax):    3.08 cm    PV Vmax:        0.48 m/s AV Area (Vmean):   3.20 cm    PV Peak grad:   0.9 mmHg AV Area (VTI):     3.26 cm    RVOT Peak grad: 1 mmHg AV Vmax:           110.50 cm/s AV Vmean:          84.500 cm/s AV VTI:            0.162 m AV Peak Grad:      4.9 mmHg AV Mean Grad:      3.0 mmHg LVOT Vmax:         85.50 cm/s LVOT Vmean:        68.100 cm/s LVOT VTI:          0.133 m LVOT/AV VTI ratio: 0.82  AORTA Ao Root diam: 3.20 cm MITRAL VALVE               TRICUSPID VALVE MV Area  (PHT): 5.79 cm    TR Peak grad:   30.0 mmHg MV Decel Time: 131 msec    TR Vmax:        274.00 cm/s MV E velocity: 41.10 cm/s MV A velocity: 63.40 cm/s  SHUNTS MV E/A ratio:  0.65        Systemic VTI:  0.13 m                            Systemic Diam: 2.25 cm Kathlyn Sacramento MD Electronically signed by Kathlyn Sacramento MD Signature Date/Time: 10/08/2019/3:30:04 PM    Final     Cardiac Studies   echo 10/08/2019: 1. Left ventricular ejection fraction, by estimation, is 25 to 30%. The  left ventricle has severely decreased function. The left ventricle has no  regional wall motion abnormalities. There is moderate left ventricular  hypertrophy. Left ventricular  diastolic parameters are consistent with Grade I diastolic dysfunction  (impaired relaxation). There is severe akinesis of the left ventricular,  mid-apical anteroseptal wall, anterolateral wall, anterior segment, apical  segment and inferoseptal wall. Wall  motion abnormalities are suggestive of stress induced cardiomyopathy vs.  LAD infarct.  2. Right ventricular systolic function is normal. The right ventricular  size is normal. There is mildly elevated pulmonary artery systolic  pressure.  3. Left atrial size was mildly dilated.  4. The mitral valve is normal in structure. No evidence of mitral valve  regurgitation. No evidence of mitral stenosis.  5. The aortic valve is normal in structure. Aortic valve regurgitation is  not visualized. Mild to moderate aortic valve sclerosis/calcification is  present, without any evidence of aortic stenosis.  6. The inferior vena cava is normal in size with greater than 50%  respiratory variability, suggesting right atrial pressure of 3 mmHg.  7. No clear vegetations but suboptimal study.  Patient Profile     86 y.o. male with history of pheochromocytoma, presenting to the hospital with tremors and chills.  Found to have bacteremia and newly diagnosed systolic dysfunction with EF 25 to  30%.  Assessment & Plan    1.  Heart failure reduced ejection fraction -Increase Coreg to 6.25 twice daily, continue losartan 12.5 daily -Patient appears  euvolemic -Plan for ischemic work-up/left heart cath after acute infection improves and creatinine stable, thrombocytopenia also preventing invasive procedures for now..   2.  Bacteremia -Transthoracic echocardiogram with no vegetation -Holding off on TEE due to history of upper esophageal spasm, patient also thrombocytopenic -Continue treatment with antibiotics -May consider TEE only if persistent bacteremia is noted.  3.  Thrombocytopenia -Platelets continue to downtrend. -Consider hematology input  Signed, Kate Sable, MD  10/10/2019, 11:57 AM

## 2019-10-10 NOTE — Care Management Important Message (Signed)
Important Message  Patient Details  Name: Dustin Harrison MRN: [id number] Date of Birth: Jun 22, 1931   Medicare Important Message Given:  Yes  Initial Medicare IM given by Patient Access Associate on 10/09/2019 at 11:16am.     Dannette Barbara 10/10/2019, 8:04 AM

## 2019-10-11 ENCOUNTER — Inpatient Hospital Stay: Payer: Medicare PPO | Admitting: Certified Registered Nurse Anesthetist

## 2019-10-11 ENCOUNTER — Inpatient Hospital Stay: Payer: Medicare PPO

## 2019-10-11 ENCOUNTER — Encounter
Admission: EM | Disposition: A | Discharge status: Skilled Nursing Facility | Payer: Self-pay | Source: Home / Self Care | Attending: Internal Medicine

## 2019-10-11 ENCOUNTER — Encounter: Payer: Self-pay | Admitting: Internal Medicine

## 2019-10-11 DIAGNOSIS — E440 Moderate protein-calorie malnutrition: Secondary | ICD-10-CM

## 2019-10-11 DIAGNOSIS — K439 Ventral hernia without obstruction or gangrene: Secondary | ICD-10-CM

## 2019-10-11 DIAGNOSIS — R531 Weakness: Secondary | ICD-10-CM

## 2019-10-11 DIAGNOSIS — N182 Chronic kidney disease, stage 2 (mild): Secondary | ICD-10-CM

## 2019-10-11 DIAGNOSIS — R1310 Dysphagia, unspecified: Secondary | ICD-10-CM

## 2019-10-11 DIAGNOSIS — I10 Essential (primary) hypertension: Secondary | ICD-10-CM

## 2019-10-11 DIAGNOSIS — I420 Dilated cardiomyopathy: Secondary | ICD-10-CM

## 2019-10-11 HISTORY — PX: XI ROBOTIC LAPAROSCOPIC ASSISTED APPENDECTOMY: SHX6877

## 2019-10-11 LAB — CBC WITH DIFFERENTIAL/PLATELET
Abs Immature Granulocytes: 0.19 10*3/uL — ABNORMAL HIGH (ref 0.00–0.07)
Basophils Absolute: 0 10*3/uL (ref 0.0–0.1)
Basophils Relative: 0 %
Eosinophils Absolute: 0 10*3/uL (ref 0.0–0.5)
Eosinophils Relative: 0 %
HCT: 48.2 % (ref 39.0–52.0)
Hemoglobin: 16.3 g/dL (ref 13.0–17.0)
Immature Granulocytes: 2 %
Lymphocytes Relative: 5 %
Lymphs Abs: 0.7 10*3/uL (ref 0.7–4.0)
MCH: 30.6 pg (ref 26.0–34.0)
MCHC: 33.8 g/dL (ref 30.0–36.0)
MCV: 90.6 fL (ref 80.0–100.0)
Monocytes Absolute: 1.5 10*3/uL — ABNORMAL HIGH (ref 0.1–1.0)
Monocytes Relative: 12 %
Neutro Abs: 10.3 10*3/uL — ABNORMAL HIGH (ref 1.7–7.7)
Neutrophils Relative %: 81 %
Platelets: 94 10*3/uL — ABNORMAL LOW (ref 150–400)
RBC: 5.32 MIL/uL (ref 4.22–5.81)
RDW: 13.1 % (ref 11.5–15.5)
WBC: 12.7 10*3/uL — ABNORMAL HIGH (ref 4.0–10.5)
nRBC: 0 % (ref 0.0–0.2)

## 2019-10-11 LAB — COMPREHENSIVE METABOLIC PANEL
ALT: 40 U/L (ref 0–44)
AST: 32 U/L (ref 15–41)
Albumin: 2.5 g/dL — ABNORMAL LOW (ref 3.5–5.0)
Alkaline Phosphatase: 86 U/L (ref 38–126)
Anion gap: 8 (ref 5–15)
BUN: 54 mg/dL — ABNORMAL HIGH (ref 8–23)
CO2: 23 mmol/L (ref 22–32)
Calcium: 8 mg/dL — ABNORMAL LOW (ref 8.9–10.3)
Chloride: 107 mmol/L (ref 98–111)
Creatinine, Ser: 1.38 mg/dL — ABNORMAL HIGH (ref 0.61–1.24)
GFR calc Af Amer: 54 mL/min — ABNORMAL LOW (ref >60)
GFR calc non Af Amer: 46 mL/min — ABNORMAL LOW (ref >60)
Glucose, Bld: 187 mg/dL — ABNORMAL HIGH (ref 70–99)
Potassium: 4.1 mmol/L (ref 3.5–5.1)
Sodium: 138 mmol/L (ref 135–145)
Total Bilirubin: 1.1 mg/dL (ref 0.3–1.2)
Total Protein: 5.8 g/dL — ABNORMAL LOW (ref 6.5–8.1)

## 2019-10-11 LAB — GLUCOSE, CAPILLARY
Glucose-Capillary: 188 mg/dL — ABNORMAL HIGH (ref 70–99)
Glucose-Capillary: 177 mg/dL — ABNORMAL HIGH (ref 70–99)
Glucose-Capillary: 208 mg/dL — ABNORMAL HIGH (ref 70–99)
Glucose-Capillary: 175 mg/dL — ABNORMAL HIGH (ref 70–99)
Glucose-Capillary: 150 mg/dL — ABNORMAL HIGH (ref 70–99)

## 2019-10-11 LAB — PROTIME-INR
INR: 1.1 (ref 0.8–1.2)
Prothrombin Time: 14.1 seconds (ref 11.4–15.2)

## 2019-10-11 LAB — FIBRINOGEN: Fibrinogen: 690 mg/dL — ABNORMAL HIGH (ref 210–475)

## 2019-10-11 LAB — APTT: aPTT: 40 seconds — ABNORMAL HIGH (ref 24–36)

## 2019-10-11 LAB — MAGNESIUM: Magnesium: 2.6 mg/dL — ABNORMAL HIGH (ref 1.7–2.4)

## 2019-10-11 LAB — FIBRIN DERIVATIVES D-DIMER (ARMC ONLY): Fibrin derivatives D-dimer (ARMC): 4586.05 ng/mL (FEU) — ABNORMAL HIGH (ref 0.00–499.00)

## 2019-10-11 SURGERY — APPENDECTOMY, ROBOT-ASSISTED, LAPAROSCOPIC
Anesthesia: General | Site: Abdomen

## 2019-10-11 MED ORDER — EPHEDRINE 5 MG/ML INJ
INTRAVENOUS | Status: AC
  Filled 2019-10-11: qty 10

## 2019-10-11 MED ORDER — FENTANYL CITRATE (PF) 100 MCG/2ML IJ SOLN: 25.0000 ug | INTRAMUSCULAR | Status: DC | PRN

## 2019-10-11 MED ORDER — ONDANSETRON HCL 4 MG/2ML IJ SOLN: 4.0000 mg | Freq: Once | INTRAMUSCULAR | Status: DC | PRN

## 2019-10-11 MED ORDER — HYDRALAZINE HCL 20 MG/ML IJ SOLN: 5.0000 mg | Freq: Once | INTRAMUSCULAR | Status: AC

## 2019-10-11 MED ORDER — ONDANSETRON HCL 4 MG/2ML IJ SOLN
INTRAMUSCULAR | Status: AC
  Filled 2019-10-11: qty 2

## 2019-10-11 MED ORDER — EPHEDRINE SULFATE 50 MG/ML IJ SOLN
INTRAMUSCULAR | Status: DC | PRN
  Administered 2019-10-11: 5 mg via INTRAVENOUS

## 2019-10-11 MED ORDER — ROCURONIUM BROMIDE 10 MG/ML (PF) SYRINGE
PREFILLED_SYRINGE | INTRAVENOUS | Status: AC
  Filled 2019-10-11: qty 20

## 2019-10-11 MED ORDER — LIDOCAINE HCL (CARDIAC) PF 100 MG/5ML IV SOSY
PREFILLED_SYRINGE | INTRAVENOUS | Status: DC | PRN
  Administered 2019-10-11: 80 mg via INTRAVENOUS

## 2019-10-11 MED ORDER — KCL IN DEXTROSE-NACL 20-5-0.45 MEQ/L-%-% IV SOLN
INTRAVENOUS | Status: DC
  Filled 2019-10-11 (×5): qty 1000

## 2019-10-11 MED ORDER — FENTANYL CITRATE (PF) 100 MCG/2ML IJ SOLN
INTRAMUSCULAR | Status: AC
  Filled 2019-10-11: qty 2

## 2019-10-11 MED ORDER — HYDRALAZINE HCL 20 MG/ML IJ SOLN
INTRAMUSCULAR | Status: AC
  Administered 2019-10-11: 5 mg via INTRAVENOUS
  Filled 2019-10-11: qty 1

## 2019-10-11 MED ORDER — ACETAMINOPHEN 10 MG/ML IV SOLN
INTRAVENOUS | Status: DC | PRN
  Administered 2019-10-11: 1000 mg via INTRAVENOUS

## 2019-10-11 MED ORDER — ACETAMINOPHEN 10 MG/ML IV SOLN
INTRAVENOUS | Status: AC
  Filled 2019-10-11: qty 100

## 2019-10-11 MED ORDER — BUPIVACAINE-EPINEPHRINE 0.25% -1:200000 IJ SOLN
INTRAMUSCULAR | Status: DC | PRN
  Administered 2019-10-11: 30 mL

## 2019-10-11 MED ORDER — ROCURONIUM BROMIDE 100 MG/10ML IV SOLN
INTRAVENOUS | Status: DC | PRN
  Administered 2019-10-11: 30 mg via INTRAVENOUS
  Administered 2019-10-11: 20 mg via INTRAVENOUS
  Administered 2019-10-11: 40 mg via INTRAVENOUS
  Administered 2019-10-11 (×2): 20 mg via INTRAVENOUS

## 2019-10-11 MED ORDER — HYDROCORTISONE NA SUCCINATE PF 100 MG IJ SOLR
INTRAMUSCULAR | Status: AC
  Filled 2019-10-11: qty 2

## 2019-10-11 MED ORDER — FENTANYL CITRATE (PF) 100 MCG/2ML IJ SOLN
INTRAMUSCULAR | Status: DC | PRN
  Administered 2019-10-11 (×3): 50 ug via INTRAVENOUS

## 2019-10-11 MED ORDER — METHYLPREDNISOLONE SODIUM SUCC 125 MG IJ SOLR
INTRAMUSCULAR | Status: DC | PRN
Start: 2019-10-11 — End: 2019-10-11

## 2019-10-11 MED ORDER — SODIUM CHLORIDE 0.9 % IV SOLN: INTRAVENOUS | Status: DC

## 2019-10-11 MED ORDER — SUGAMMADEX SODIUM 200 MG/2ML IV SOLN
INTRAVENOUS | Status: DC | PRN
  Administered 2019-10-11 (×2): 100 mg via INTRAVENOUS

## 2019-10-11 MED ORDER — SUCCINYLCHOLINE CHLORIDE 200 MG/10ML IV SOSY
PREFILLED_SYRINGE | INTRAVENOUS | Status: AC
  Filled 2019-10-11: qty 10

## 2019-10-11 MED ORDER — NALOXONE HCL 0.4 MG/ML IJ SOLN
INTRAMUSCULAR | Status: DC | PRN
Start: 2019-10-11 — End: 2019-10-11
  Administered 2019-10-11: .2 mg via INTRAVENOUS

## 2019-10-11 MED ORDER — PHENYLEPHRINE HCL (PRESSORS) 10 MG/ML IV SOLN
INTRAVENOUS | Status: DC | PRN
  Administered 2019-10-11: 50 ug via INTRAVENOUS
  Administered 2019-10-11 (×3): 100 ug via INTRAVENOUS

## 2019-10-11 MED ORDER — HYDRALAZINE HCL 20 MG/ML IJ SOLN
INTRAMUSCULAR | Status: AC
  Filled 2019-10-11: qty 1

## 2019-10-11 MED ORDER — SUCCINYLCHOLINE CHLORIDE 20 MG/ML IJ SOLN
INTRAMUSCULAR | Status: DC | PRN
  Administered 2019-10-11: 100 mg via INTRAVENOUS

## 2019-10-11 MED ORDER — BUPIVACAINE LIPOSOME 1.3 % IJ SUSP
INTRAMUSCULAR | Status: DC | PRN
  Administered 2019-10-11: 20 mL

## 2019-10-11 MED ORDER — ETOMIDATE 2 MG/ML IV SOLN
INTRAVENOUS | Status: DC | PRN
  Administered 2019-10-11: 15 mg via INTRAVENOUS

## 2019-10-11 MED ORDER — HYDROCORTISONE NA SUCCINATE PF 1000 MG IJ SOLR
INTRAMUSCULAR | Status: DC | PRN
  Administered 2019-10-11: 100 mg via INTRAVENOUS

## 2019-10-11 MED ORDER — ONDANSETRON HCL 4 MG/2ML IJ SOLN
INTRAMUSCULAR | Status: DC | PRN
  Administered 2019-10-11: 4 mg via INTRAVENOUS

## 2019-10-11 MED ORDER — CLONIDINE HCL 0.1 MG/24HR TD PTWK
0.1000 mg | MEDICATED_PATCH | TRANSDERMAL | Status: DC
  Administered 2019-10-11: 0.1 mg via TRANSDERMAL
  Filled 2019-10-11: qty 1

## 2019-10-11 MED ORDER — SODIUM CHLORIDE 0.9 % IV SOLN
2.0000 g | INTRAVENOUS | Status: AC
  Administered 2019-10-11: 2 g via INTRAVENOUS
  Filled 2019-10-11: qty 2

## 2019-10-11 SURGICAL SUPPLY — 58 items
BAG BILE T-TUBES STRL (MISCELLANEOUS) ×3 IMPLANT
CANISTER SUCT 1200ML W/VALVE (MISCELLANEOUS) ×3 IMPLANT
CANNULA REDUC XI 12-8 STAPL (CANNULA) ×1
CANNULA REDUC XI 12-8MM STAPL (CANNULA) ×1
CANNULA REDUCER 12-8 DVNC XI (CANNULA) ×1 IMPLANT
CHLORAPREP W/TINT 26 (MISCELLANEOUS) ×3 IMPLANT
COVER TIP SHEARS 8 DVNC (MISCELLANEOUS) ×1 IMPLANT
COVER TIP SHEARS 8MM DA VINCI (MISCELLANEOUS) ×2
COVER WAND RF STERILE (DRAPES) ×3 IMPLANT
DEFOGGER SCOPE WARMER CLEARIFY (MISCELLANEOUS) ×3 IMPLANT
DERMABOND ADVANCED (GAUZE/BANDAGES/DRESSINGS) ×2
DERMABOND ADVANCED .7 DNX12 (GAUZE/BANDAGES/DRESSINGS) ×1 IMPLANT
DRAPE ARM DVNC X/XI (DISPOSABLE) ×4 IMPLANT
DRAPE COLUMN DVNC XI (DISPOSABLE) ×1 IMPLANT
DRAPE DA VINCI XI ARM (DISPOSABLE) ×8
DRAPE DA VINCI XI COLUMN (DISPOSABLE) ×2
ELECT CAUTERY BLADE 6.4 (BLADE) ×3 IMPLANT
ELECT REM PT RETURN 9FT ADLT (ELECTROSURGICAL) ×3
ELECTRODE REM PT RTRN 9FT ADLT (ELECTROSURGICAL) ×1 IMPLANT
GLOVE BIOGEL M 7.0 STRL (GLOVE) ×6 IMPLANT
GOWN STRL REUS W/ TWL LRG LVL3 (GOWN DISPOSABLE) ×3 IMPLANT
GOWN STRL REUS W/TWL LRG LVL3 (GOWN DISPOSABLE) ×6
IRRIGATION STRYKERFLOW (MISCELLANEOUS) IMPLANT
IRRIGATOR STRYKERFLOW (MISCELLANEOUS)
KIT PINK PAD W/HEAD ARE REST (MISCELLANEOUS) ×3
KIT PINK PAD W/HEAD ARM REST (MISCELLANEOUS) ×1 IMPLANT
LABEL OR SOLS (LABEL) IMPLANT
NEEDLE HYPO 22GX1.5 SAFETY (NEEDLE) ×3 IMPLANT
OBTURATOR OPTICAL STANDARD 8MM (TROCAR) ×2
OBTURATOR OPTICAL STND 8 DVNC (TROCAR) ×1
OBTURATOR OPTICALSTD 8 DVNC (TROCAR) ×1 IMPLANT
PACK LAP CHOLECYSTECTOMY (MISCELLANEOUS) ×3 IMPLANT
PENCIL ELECTRO HAND CTR (MISCELLANEOUS) ×3 IMPLANT
POUCH SPECIMEN RETRIEVAL 10MM (ENDOMECHANICALS) IMPLANT
SCISSORS MNPLR CVD DVNC XI (INSTRUMENTS) IMPLANT
SCISSORS XI MNPLR CVD DVNC (INSTRUMENTS)
SEAL CANN UNIV 5-8 DVNC XI (MISCELLANEOUS) ×2 IMPLANT
SEAL XI 5MM-8MM UNIVERSAL (MISCELLANEOUS) ×4
SEALER VESSEL DA VINCI XI (MISCELLANEOUS)
SEALER VESSEL EXT DVNC XI (MISCELLANEOUS) IMPLANT
SOLUTION ELECTROLUBE (MISCELLANEOUS) ×3 IMPLANT
SPONGE DRAIN TRACH 4X4 STRL 2S (GAUZE/BANDAGES/DRESSINGS) ×6 IMPLANT
SPONGE LAP 4X18 RFD (DISPOSABLE) ×3 IMPLANT
STAPLER CANNULA SEAL DVNC XI (STAPLE) ×1 IMPLANT
STAPLER CANNULA SEAL XI (STAPLE) ×2
SUT DVC VLOC 90 3-0 CV23 VLT (SUTURE) ×6
SUT ETHIBOND 0 MO6 C/R (SUTURE) ×3 IMPLANT
SUT MNCRL 4-0 (SUTURE) ×2
SUT MNCRL 4-0 27XMFL (SUTURE) ×1
SUT SILK 2 0 (SUTURE)
SUT SILK 2-0 18XBRD TIE 12 (SUTURE) IMPLANT
SUT VICRYL 0 AB UR-6 (SUTURE) ×6 IMPLANT
SUTURE DVC VLC 90 3-0 CV23 VLT (SUTURE) ×2 IMPLANT
SUTURE MNCRL 4-0 27XMF (SUTURE) ×1 IMPLANT
SYR 30ML LL (SYRINGE) ×3 IMPLANT
TAPE TRANSPORE STRL 2 31045 (GAUZE/BANDAGES/DRESSINGS) ×3 IMPLANT
TROCAR BALLN GELPORT 12X130M (ENDOMECHANICALS) ×3 IMPLANT
TUBING EVAC SMOKE HEATED PNEUM (TUBING) ×3 IMPLANT

## 2019-10-11 NOTE — Progress Notes (Signed)
Progress Note  Patient Name: Dustin Harrison Date of Encounter: 10/11/2019  Primary Cardiologist: Ida Rogue, MD - pt request  Subjective   No c/p or dyspnea.  Hungry.  Awaiting PEG placement this afternoon.  Inpatient Medications    Scheduled Meds: . carvedilol  6.25 mg Oral BID WC  . cloNIDine  0.1 mg Transdermal Weekly  . cyclobenzaprine  5 mg Oral TID  . finasteride  5 mg Oral Daily  . insulin aspart  0-15 Units Subcutaneous TID WC  . insulin aspart  4 Units Subcutaneous TID WC  . lidocaine  1 patch Transdermal Q24H  . losartan  12.5 mg Oral Daily  . psyllium  1 packet Oral Daily  . tamsulosin  0.8 mg Oral Daily  . timolol  1 drop Both Eyes BID   Continuous Infusions: . [START ON 10/12/2019] cefoTEtan (CEFOTAN) IV    . dextrose 5 % and 0.45 % NaCl with KCl 20 mEq/L 75 mL/hr at 10/11/19 1249  . penicillin g continuous IV infusion 12 Million Units (10/11/19 1255)   PRN Meds: HYDROcodone-acetaminophen, labetalol, morphine injection, ondansetron **OR** ondansetron (ZOFRAN) IV   Vital Signs    Vitals:   10/10/19 1249 10/10/19 2022 10/11/19 0408 10/11/19 0824  BP: (!) 157/97 (!) 160/94 (!) 154/87 (!) 161/94  Pulse: 75 79 82 86  Resp: 18 20 (!) 22 12  Temp: 97.6 F (36.4 C) 97.9 F (36.6 C) 98.3 F (36.8 C) 98.4 F (36.9 C)  TempSrc:   Oral   SpO2: 97% 94% 96% 97%  Weight:      Height:        Intake/Output Summary (Last 24 hours) at 10/11/2019 1407 Last data filed at 10/11/2019 1300 Gross per 24 hour  Intake 845 ml  Output 1550 ml  Net -705 ml   Filed Weights   10/07/19 0338  Weight: 87.1 kg    Physical Exam   GEN: Well nourished, well developed, in no acute distress.  HEENT: Voice is hoarse. Neck: Supple, no JVD, carotid bruits, or masses. Cardiac: RRR, no murmurs, rubs, or gallops. No clubbing, cyanosis, edema.  Radials/DP/PT 1+ and equal bilaterally.  Respiratory:  Respirations regular and unlabored, diminished breath sounds @ bilat bases. GI:  Soft, nontender, nondistended, BS + x 4. MS: no deformity or atrophy. Skin: warm and dry, no rash. Neuro:  Strength and sensation are intact. Psych: AAOx3.  Normal affect.  Labs    Chemistry Recent Labs  Lab 10/08/19 0511 10/09/19 0033 10/11/19 0445  NA 135 135 138  K 4.2 4.3 4.1  CL 104 105 107  CO2 23 24 23   GLUCOSE 148* 164* 187*  BUN 48* 42* 54*  CREATININE 1.61* 1.22 1.38*  CALCIUM 8.3* 8.0* 8.0*  PROT  --   --  5.8*  ALBUMIN  --   --  2.5*  AST  --   --  32  ALT  --   --  40  ALKPHOS  --   --  86  BILITOT  --   --  1.1  GFRNONAA 38* 54* 46*  GFRAA 45* >60 54*  ANIONGAP 8 6 8      Hematology Recent Labs  Lab 10/09/19 0033 10/10/19 0415 10/11/19 0445  WBC 21.0* 15.5* 12.7*  RBC 4.92 5.40 5.32  HGB 15.1 16.7 16.3  HCT 44.7 48.2 48.2  MCV 90.9 89.3 90.6  MCH 30.7 30.9 30.6  MCHC 33.8 34.6 33.8  RDW 13.1 13.3 13.1  PLT 80* 74* 94*  Radiology    DG Wrist Complete Right  Result Date: 10/08/2019 CLINICAL DATA:  Right wrist pain. EXAM: RIGHT WRIST - COMPLETE 3+ VIEW COMPARISON:  None. FINDINGS: There is no acute displaced fracture or dislocation. There are degenerative changes of the metacarpophalangeal joints. There are degenerative changes of the radiocarpal joint. There are degenerative changes of the scaphoid trapezial joint. IMPRESSION: Negative. Electronically Signed   By: Constance Holster M.D.   On: 10/08/2019 17:11   MR BRAIN WO CONTRAST  Result Date: 10/09/2019 CLINICAL DATA:  Right upper extremity weakness EXAM: MRI HEAD WITHOUT CONTRAST TECHNIQUE: Multiplanar, multiecho pulse sequences of the brain and surrounding structures were obtained without intravenous contrast. COMPARISON:  None. FINDINGS: There is substantial artifact related to left cochlear implant. Majority of the brain is obscured on diffusion weighted imaging. Susceptibility weighted imaging is nondiagnostic. Ventricles appear stable in size. Probable chronic microvascular ischemic  changes. IMPRESSION: Substantial artifact related to cochlear implant. Acute infarction causing reported symptoms cannot be excluded. Electronically Signed   By: Macy Mis M.D.   On: 10/09/2019 11:39    Telemetry    Pt not on tele - Personally Reviewed  Cardiac Studies   echo 10/08/2019: 1. Left ventricular ejection fraction, by estimation, is 25 to 30%. The  left ventricle has severely decreased function. The left ventricle has no  regional wall motion abnormalities. There is moderate left ventricular  hypertrophy. Left ventricular  diastolic parameters are consistent with Grade I diastolic dysfunction  (impaired relaxation). There is severe akinesis of the left ventricular,  mid-apical anteroseptal wall, anterolateral wall, anterior segment, apical  segment and inferoseptal wall. Wall   motion abnormalities are suggestive of stress induced cardiomyopathy vs.  LAD infarct.   2. Right ventricular systolic function is normal. The right ventricular  size is normal. There is mildly elevated pulmonary artery systolic  pressure.   3. Left atrial size was mildly dilated.   4. The mitral valve is normal in structure. No evidence of mitral valve  regurgitation. No evidence of mitral stenosis.   5. The aortic valve is normal in structure. Aortic valve regurgitation is  not visualized. Mild to moderate aortic valve sclerosis/calcification is  present, without any evidence of aortic stenosis.   6. The inferior vena cava is normal in size with greater than 50%  respiratory variability, suggesting right atrial pressure of 3 mmHg.   7. No clear vegetations but suboptimal study.   Patient Profile     86 y.o. male with history of pheochromocytoma, presenting to the hospital with tremors and chills.  Found to have bacteremia and newly diagnosed systolic dysfunction with EF 25 to 30%.   Assessment & Plan    1.  Heart Failure w/ reduced EF/Cardiomyopathy:  Echo this admission w/ finding of  newly discovered LV dysfxn (EF 25-30%).  He remains euvolemic.   blocker and ARB rx ordered but he has not received in several days 2/2 NPO status/dysphagia now pending PEG.  He has not required diuretics.  As prev noted, will pursue ischemic eval following recovery from infectious process.    2.  Bacteremia:  We will keep NPO after midnight and provided that he does well after PEG placement later today, we will likely pursue TEE in the AM.  3.  Thrombocytopenia:  Improving.  Heme following.  4.  CKD III:  Creat relatively stable.  Resume ARB post-PEG.  5.  Essential HTN:  Pressures trending 150s to 160s in the setting of being NPO.  Re-eval following  PEG and resumption of ordered meds.  Signed, Murray Hodgkins, NP  10/11/2019, 2:07 PM    For questions or updates, please contact   Please consult [email or web address] for contact info under Cardiology/STEMI.

## 2019-10-11 NOTE — Anesthesia Procedure Notes (Signed)
Procedure Name: Intubation Date/Time: 10/11/2019 4:01 PM Performed by: Caryl Asp, CRNA Pre-anesthesia Checklist: Patient identified, Patient being monitored, Timeout performed, Emergency Drugs available and Suction available Patient Re-evaluated:Patient Re-evaluated prior to induction Oxygen Delivery Method: Circle system utilized Preoxygenation: Pre-oxygenation with 100% oxygen Induction Type: IV induction, Rapid sequence and Cricoid Pressure applied Ventilation: Unable to mask ventilate Laryngoscope Size: Mac and 3 Grade View: Grade I Tube type: Oral Tube size: 7.5 mm Number of attempts: 1 Airway Equipment and Method: Stylet and Video-laryngoscopy Placement Confirmation: ETT inserted through vocal cords under direct vision,  positive ETCO2 and breath sounds checked- equal and bilateral Secured at: 24 cm Tube secured with: Tape Dental Injury: Teeth and Oropharynx as per pre-operative assessment

## 2019-10-11 NOTE — Transfer of Care (Signed)
Immediate Anesthesia Transfer of Care Note  Patient: Dustin Harrison  Procedure(s) Performed: XI ROBOTIC ASSISTED LAPAROSCOPIC INSERTION GASTROSTOMY TUBE (N/A Abdomen)  Patient Location: PACU  Anesthesia Type:General  Level of Consciousness: awake, alert  and oriented  Airway & Oxygen Therapy: Patient Spontanous Breathing and Patient connected to face mask oxygen  Post-op Assessment: Report given to RN and Post -op Vital signs reviewed and stable  Post vital signs: Reviewed and stable  Last Vitals:  Vitals Value Taken Time  BP 173/109 10/11/19 1744  Temp 36.6 C 10/11/19 1743  Pulse 71 10/11/19 1748  Resp 19 10/11/19 1748  SpO2 94 % 10/11/19 1748  Vitals shown include unvalidated device data.  Last Pain:  Vitals:   10/11/19 1508  TempSrc: Tympanic  PainSc: 0-No pain      Patients Stated Pain Goal: 0 ([id number])  Complications: No apparent anesthesia complications

## 2019-10-11 NOTE — Evaluation (Signed)
Objective Swallowing Evaluation: Type of Study: MBS-Modified Barium Swallow Study (repeat)   Patient Details  Name: Dustin Harrison MRN: [id number] Date of Birth: 1931-08-31  Today's Date: 10/11/2019 Time: SLP Start Time (ACUTE ONLY): 1400 -SLP Stop Time (ACUTE ONLY): 1500  SLP Time Calculation (min) (ACUTE ONLY): 60 min   Past Medical History:  Past Medical History:  Diagnosis Date  . Abnormal glucose   . Arthritis   . Baker's cyst of knee    left  . BPH (benign prostatic hypertrophy)   . Cough    because of "tight" esophagus  . Diabetes mellitus   . Glaucoma   . HOH (hard of hearing)   . Hypertension   . Pheochromocytoma of right adrenal gland   . Ulcer   . Wears hearing aid    bilateral   Past Surgical History:  Past Surgical History:  Procedure Laterality Date  . adrenaletomy    . CATARACT EXTRACTION W/PHACO Left 10/08/2015   Procedure: CATARACT EXTRACTION PHACO AND INTRAOCULAR LENS PLACEMENT ([place]) left eye;  Surgeon: Leandrew Koyanagi, MD;  Location: [place];  Service: Ophthalmology;  Laterality: Left;  MALYUGIN  . HERNIA REPAIR    . SQUAMOUS CELL CARCINOMA EXCISION  03-2006   left ear  . TONSILLECTOMY     HPI: Pt is a 86 y.o. male with medical history significant for hypertension, diabetes mellitus, cochlear implant on L w/ HA on R, and chronic low back pain who presents to the emergency room for evaluation of worsening low back pain and generalized weakness.  Patient also complains of pain in his neck which is new.  Per EMS patient has had 2 falls in the past week  but patient is unable to recall the details of the falls. MRI of the cervical spine showed cervical spondylosis and degenerative disc disease, causing prominent impingement at C3-4 and C4-5; moderate impingement at C2-3; and mild impingement at C5-6 and C7-T1. MRI of the lumbar spine showed lumbar spondylosis and degenerative disc disease, causing moderate impingement at L4-5 and mild impingement  at L3-4 and L5-S1.  Of note, per past history, pt has been told he has a cough "because of tight esophagus". Pt endorsed ongoing swallowing problems "for awhile now" when asked.  Pt is currently being assessed/followed by Neurology, ENT d/t apparent Neuromuscular changes in presentation; ENT has noted R vocal cord paralysis.    Subjective: pt sitting in mbss chair; Dysphonia noted during speech. Pt exhibited RUE weakness also.     Assessment / Plan / Recommendation  CHL IP CLINICAL IMPRESSIONS 10/11/2019  Clinical Impression Pt appears to present w/ Moderate-Severe pharyngeal-esophageal phase Dysphagia w/ both Aspiration and Laryngeal Penetration noted during this exam. Pt is at High risk for Aspiration to occur w/ oral intake of any consistency. During the oral phase, decreased bolus control and organization for timely A-P transfer for swallow -- premature spillage noted w/ thin and Nectar liquids. Adequate bolus mangement w/ trials of Puree and increased viscosity of liquids(Honey). Slight-Min BOT residue noted. During the pharyngeal phase, moderate+ delay in pharyngeal swallow initiation w/ thin and Nectar liquids spilling from the Valleculae to the Pyriform Sinuses b/f pharyngeal swallow occurred. This resulted in Aspiration of both thin and Nectar liquids - noted little to no airway closure(protection) as liquids spilled deep into pharynx. W/ Honey consistency liquids, pharyngeal swallow initiation did not occur until the liquid was spilling from the Valleculae. During the swallow, decreased laryngeal excursion and anterior movement noted; decreased pharyngeal pressure noted. This  resulted in Moderate+ pharyngeal residue remaining throughout the pharynx: BOT, Valleculae, Pharyngeal Wall, and Pyriform Sinuses. Also suspect the impact from bolus material NOT clearing the UES in an efficient manner contributed to the increased pharyngeal residue. Due to the amount of pharyngeal residue after the swallow,  Laryngeal Penetration was noted to occur b/t trials and as pt attempted f/u, Dry swallows to clear the residue. Suspect small amounts of Aspiration to occur from the Laryngeal Penetration b/t trials also. Pt was NOT immediate Sensate to the Laryngeal Penetration or Aspiration occurring during this exam(delayed throat clearing/cough occurred; pt was also cued to cough hard when aspiration noted). During the Esophageal phase, pt presented w/ a Kyphotic appearance in the Cervical spine; also noted impact from C5-6 on the efficiency of the UES opening and the ability to clear bolus material through the UES. Only small amounts of bolus material moved through the UES at a time; multiple Dry swallows aided in clearing some of the pharyngeal residue remaining but impedement noted. This increased the risk for the pharyngeal residue to become Laryngeal Penetration. Due to the presentation of this exam and pt's POC currently, a recommendation for NPO Status is made. NSG and MD made aware of the results of this exam. Precautions posted.   SLP Visit Diagnosis Dysphagia, oropharyngeal phase (R13.12);Dysphagia, pharyngoesophageal phase (R13.14)  Attention and concentration deficit following --  Frontal lobe and executive function deficit following --  Impact on safety and function Moderate aspiration risk;Severe aspiration risk;Risk for inadequate nutrition/hydration      CHL IP TREATMENT RECOMMENDATION 10/11/2019  Treatment Recommendations Therapy as outlined in treatment plan below     Prognosis 10/11/2019  Prognosis for Safe Diet Advancement Guarded  Barriers to Reach Goals Time post onset;Severity of deficits  Barriers/Prognosis Comment --    CHL IP DIET RECOMMENDATION 10/11/2019  SLP Diet Recommendations NPO  Liquid Administration via --  Medication Administration Via alternative means  Compensations --  Postural Changes --      CHL IP OTHER RECOMMENDATIONS 10/11/2019  Recommended Consults Consider ENT  evaluation;Consider GI evaluation  Oral Care Recommendations Oral care QID;Staff/trained caregiver to provide oral care  Other Recommendations --      CHL IP FOLLOW UP RECOMMENDATIONS 10/11/2019  Follow up Recommendations Skilled Nursing facility      [facility] IP FREQUENCY AND DURATION 10/11/2019  Speech Therapy Frequency (ACUTE ONLY) (No Data)  Treatment Duration (No Data)           CHL IP ORAL PHASE 10/11/2019  Oral Phase Impaired  Oral - Pudding Teaspoon --  Oral - Pudding Cup --  Oral - Honey Teaspoon --  Oral - Honey Cup --  Oral - Nectar Teaspoon --  Oral - Nectar Cup --  Oral - Nectar Straw --  Oral - Thin Teaspoon --  Oral - Thin Cup --  Oral - Thin Straw --  Oral - Puree --  Oral - Mech Soft --  Oral - Regular --  Oral - Multi-Consistency --  Oral - Pill --  Oral Phase - Comment min    CHL IP PHARYNGEAL PHASE 10/11/2019  Pharyngeal Phase Impaired  Pharyngeal- Pudding Teaspoon --  Pharyngeal --  Pharyngeal- Pudding Cup --  Pharyngeal --  Pharyngeal- Honey Teaspoon --  Pharyngeal --  Pharyngeal- Honey Cup --  Pharyngeal --  Pharyngeal- Nectar Teaspoon --  Pharyngeal --  Pharyngeal- Nectar Cup --  Pharyngeal --  Pharyngeal- Nectar Straw --  Pharyngeal --  Pharyngeal- Thin Teaspoon --  Pharyngeal --  Pharyngeal- Thin Cup --  Pharyngeal --  Pharyngeal- Thin Straw --  Pharyngeal --  Pharyngeal- Puree --  Pharyngeal --  Pharyngeal- Mechanical Soft --  Pharyngeal --  Pharyngeal- Regular --  Pharyngeal --  Pharyngeal- Multi-consistency --  Pharyngeal --  Pharyngeal- Pill --  Pharyngeal --  Pharyngeal Comment --     CHL IP CERVICAL ESOPHAGEAL PHASE 10/11/2019  Cervical Esophageal Phase Impaired  Pudding Teaspoon --  Pudding Cup --  Honey Teaspoon --  Honey Cup --  Nectar Teaspoon --  Nectar Cup --  Nectar Straw --  Thin Teaspoon --  Thin Cup --  Thin Straw --  Puree --  Mechanical Soft --  Regular --  Multi-consistency --  Pill --  Cervical  Esophageal Comment --       Orinda Kenner, MS, CCC-SLP Yvonda Fouty 10/11/2019, 4:35 PM

## 2019-10-11 NOTE — Progress Notes (Signed)
   10/11/19 1440  Clinical Encounter Type  Visited With Patient and family together  Visit Type Initial  Referral From Other (Comment)  Consult/Referral To Chaplain  Speech therapist asked chaplain to stop in and see patient. As chaplain entered the room, patient was being wheeled out for surgery. Chaplain saw patient's wife and she asked if someone could wheel her downstairs, she told chaplain she needed to go to her car. Chaplain stopped at nurse's station and inquired about assistance for Mrs. Dustin Harrison. Chaplain will check on patient tomorrow.

## 2019-10-11 NOTE — Progress Notes (Signed)
Subjective: s/p discussion with family regarding PEG   Past Medical History:  Diagnosis Date  . Abnormal glucose   . Arthritis   . Baker's cyst of knee    left  . BPH (benign prostatic hypertrophy)   . Cough    because of "tight" esophagus  . Diabetes mellitus   . Glaucoma   . HOH (hard of hearing)   . Hypertension   . Pheochromocytoma of right adrenal gland   . Ulcer   . Wears hearing aid    bilateral    Past Surgical History:  Procedure Laterality Date  . adrenaletomy    . CATARACT EXTRACTION W/PHACO Left 10/08/2015   Procedure: CATARACT EXTRACTION PHACO AND INTRAOCULAR LENS PLACEMENT ([place]) left eye;  Surgeon: Leandrew Koyanagi, MD;  Location: [place];  Service: Ophthalmology;  Laterality: Left;  MALYUGIN  . HERNIA REPAIR    . SQUAMOUS CELL CARCINOMA EXCISION  03-2006   left ear  . TONSILLECTOMY      Family History  Problem Relation Age of Onset  . Alcohol abuse Mother   . Coronary artery disease Mother   . Brain cancer Father        tumor  . Diabetes Brother     Social History:  reports that he has never smoked. He has never used smokeless tobacco. He reports current alcohol use of about 7.0 standard drinks of alcohol per week. He reports that he does not use drugs.  No Known Allergies  Medications: I have reviewed the patient's current medications.  ROS: Not obtained as confused   Physical Examination: Blood pressure (!) 161/94, pulse 86, temperature 98.4 F (36.9 C), resp. rate 12, height 5\' 11"  (1.803 m), weight 87.1 kg, SpO2 97 %.    Neurological Examination   Mental Status: Alert to name . Cranial Nerves: II: Discs flat bilaterally; Visual fields grossly normal, pupils equal, round, reactive to light and accommodation III,IV, VI: ptosis not present, extra-ocular motions intact bilaterally V,VII: smile symmetric, facial light touch sensation normal bilaterally XI: bilateral shoulder shrug XII: mild deviation to R side.   Motor: Generalized weakness bilaterally upper and lower extremities.   Increased tone b/l upper extremities Sensory: Pinprick and light touch intact throughout, bilaterally Deep Tendon Reflexes: 1+ and symmetric throughout Plantars: Right: downgoing   Left: downgoing Cerebellar: Not tested Gait: normal gait and station      Laboratory Studies:   Basic Metabolic Panel: Recent Labs  Lab 10/07/19 0727 10/07/19 0727 10/08/19 0511 10/09/19 0033 10/11/19 0445  NA 134*  --  135 135 138  K 5.0  --  4.2 4.3 4.1  CL 102  --  104 105 107  CO2 23  --  23 24 23   GLUCOSE 140*  --  148* 164* 187*  BUN 66*  --  48* 42* 54*  CREATININE 2.44*  --  1.61* 1.22 1.38*  CALCIUM 8.6*   < > 8.3* 8.0* 8.0*  MG  --   --   --   --  2.6*   < > = values in this interval not displayed.    Liver Function Tests: Recent Labs  Lab 10/11/19 0445  AST 32  ALT 40  ALKPHOS 86  BILITOT 1.1  PROT 5.8*  ALBUMIN 2.5*   No results for input(s): LIPASE, AMYLASE in the last 168 hours. No results for input(s): AMMONIA in the last 168 hours.  CBC: Recent Labs  Lab 10/07/19 0727 10/08/19 0511 10/09/19 0033 10/10/19 0415 10/11/19 0445  WBC  28.1* 23.5* 21.0* 15.5* 12.7*  NEUTROABS 24.3*  --   --   --  10.3*  HGB 14.4 15.1 15.1 16.7 16.3  HCT 43.4 44.9 44.7 48.2 48.2  MCV 93.3 91.6 90.9 89.3 90.6  PLT 121* 99* 80* 74* 94*    Cardiac Enzymes: Recent Labs  Lab 10/07/19 0727  CKTOTAL 305    BNP: Invalid input(s): POCBNP  CBG: Recent Labs  Lab 10/10/19 0733 10/10/19 1202 10/10/19 1726 10/10/19 2058 10/11/19 0820  GLUCAP 167* 157* 91 158* 188*    Microbiology: Results for orders placed or performed during the hospital encounter of 10/07/19  Urine culture     Status: None   Collection Time: 10/07/19  7:27 AM   Specimen: Urine, Random  Result Value Ref Range Status   Specimen Description   Final    URINE, RANDOM Performed at [facility], [address],  [place], [place] [address]    Special Requests   Final    NONE Performed at [facility], [address], [place], [place] [address]    Culture   Final    NO GROWTH Performed at [facility], [place] [address] [place], [place] [address]    Report Status 10/08/2019 FINAL  Final  Blood Culture (routine x 2)     Status: Abnormal   Collection Time: 10/07/19  8:31 AM   Specimen: BLOOD  Result Value Ref Range Status   Specimen Description   Final    BLOOD LAC Performed at [facility], [address], [place], [place] [address]    Special Requests   Final    BOTTLES DRAWN AEROBIC AND ANAEROBIC Blood Culture adequate volume Performed at [facility], [address] [place], [place] [address]    Culture  Setup Time   Final    GRAM POSITIVE COCCI IN BOTH AEROBIC AND ANAEROBIC BOTTLES CRITICAL RESULT CALLED TO, READ BACK BY AND VERIFIED WITH: ASAJAH DUNCAN ON 10/07/2019 AT 2035 TIK Performed at [facility], [address] [place], [place] [address]    Culture (A)  Final    GROUP B STREP(S.AGALACTIAE)ISOLATED SUSCEPTIBILITIES PERFORMED ON PREVIOUS CULTURE WITHIN THE LAST 5 DAYS. Performed at [facility], [place] [address], [place], [place] [address]    Report Status 10/09/2019 FINAL  Final  Blood Culture (routine x 2)     Status: Abnormal   Collection Time: 10/07/19  8:31 AM   Specimen: BLOOD  Result Value Ref Range Status   Specimen Description   Final    BLOOD LWRIST Performed at [facility], [address], [place], [place] [address]    Special Requests   Final    BOTTLES DRAWN AEROBIC AND ANAEROBIC Blood Culture adequate volume Performed at [facility], [address] [place], [place] [address]    Culture  Setup Time   Final    GRAM POSITIVE COCCI IN BOTH AEROBIC AND ANAEROBIC BOTTLES CRITICAL RESULT CALLED TO, READ BACK BY AND VERIFIED WITH: ASAJAH DUNCAN ON 10/07/2019 AT 2035 TIK Performed at [facility], [place] [address], [place], [place] [address]    Culture GROUP B STREP(S.AGALACTIAE)ISOLATED (A)  Final   Report Status 10/09/2019 FINAL  Final   Organism ID, Bacteria GROUP B STREP(S.AGALACTIAE)ISOLATED  Final      Susceptibility   Group b strep(s.agalactiae)isolated - MIC*    CLINDAMYCIN <=0.25 SENSITIVE Sensitive     AMPICILLIN <=0.25 SENSITIVE Sensitive     ERYTHROMYCIN <=0.12  SENSITIVE Sensitive     VANCOMYCIN 0.5 SENSITIVE Sensitive     CEFTRIAXONE <=0.12 SENSITIVE Sensitive     LEVOFLOXACIN 1 SENSITIVE Sensitive     PENICILLIN Value in next row Sensitive      SENSITIVE0.06    * GROUP B STREP(S.AGALACTIAE)ISOLATED  Blood Culture ID Panel (Reflexed)     Status: Abnormal   Collection Time: 10/07/19  8:31 AM  Result Value Ref Range Status   Enterococcus species NOT DETECTED NOT DETECTED Final   Listeria monocytogenes NOT DETECTED NOT DETECTED Final   Staphylococcus species NOT DETECTED NOT DETECTED Final   Staphylococcus aureus (BCID) NOT DETECTED NOT DETECTED Final   Streptococcus species DETECTED (A) NOT DETECTED Final    Comment: CRITICAL RESULT CALLED TO, READ BACK BY AND VERIFIED WITH: ASAJAH DUNCAN ON 10/07/2019 AT 2035 TIK    Streptococcus agalactiae DETECTED (A) NOT DETECTED Final    Comment: CRITICAL RESULT CALLED TO, READ BACK BY AND VERIFIED WITH: ASAJAH DUNCAN ON 10/07/2019 AT 2035 TIK    Streptococcus pneumoniae NOT DETECTED NOT DETECTED Final   Streptococcus pyogenes NOT DETECTED NOT DETECTED Final   Acinetobacter baumannii NOT DETECTED NOT DETECTED Final   Enterobacteriaceae species NOT DETECTED NOT DETECTED Final   Enterobacter cloacae complex NOT DETECTED NOT DETECTED Final   Escherichia coli NOT DETECTED NOT DETECTED Final   Klebsiella oxytoca NOT DETECTED NOT DETECTED Final   Klebsiella pneumoniae NOT DETECTED NOT DETECTED Final   Proteus species NOT DETECTED NOT DETECTED Final   Serratia marcescens NOT DETECTED NOT DETECTED Final   Haemophilus  influenzae NOT DETECTED NOT DETECTED Final   Neisseria meningitidis NOT DETECTED NOT DETECTED Final   Pseudomonas aeruginosa NOT DETECTED NOT DETECTED Final   Candida albicans NOT DETECTED NOT DETECTED Final   Candida glabrata NOT DETECTED NOT DETECTED Final   Candida krusei NOT DETECTED NOT DETECTED Final   Candida parapsilosis NOT DETECTED NOT DETECTED Final   Candida tropicalis NOT DETECTED NOT DETECTED Final    Comment: Performed at [facility], [address] [place], [place] [address]  Respiratory Panel by RT PCR (Flu A&B, Covid) - Nasopharyngeal Swab     Status: None   Collection Time: 10/07/19 12:27 PM   Specimen: Nasopharyngeal Swab  Result Value Ref Range Status   SARS Coronavirus 2 by RT PCR NEGATIVE NEGATIVE Final    Comment: (NOTE) SARS-CoV-2 target nucleic acids are NOT DETECTED. The SARS-CoV-2 RNA is generally detectable in upper respiratoy specimens during the acute phase of infection. The lowest concentration of SARS-CoV-2 viral copies this assay can detect is 131 copies/mL. A negative result does not preclude SARS-Cov-2 infection and should not be used as the sole basis for treatment or other patient management decisions. A negative result may occur with  improper specimen collection/handling, submission of specimen other than nasopharyngeal swab, presence of viral mutation(s) within the areas targeted by this assay, and inadequate number of viral copies (<131 copies/mL). A negative result must be combined with clinical observations, patient history, and epidemiological information. The expected result is Negative. Fact Sheet for Patients:  PinkCheek.be Fact Sheet for Healthcare Providers:  GravelBags.it This test is not yet ap proved or cleared by the [place] FDA and  has been authorized for detection and/or diagnosis of SARS-CoV-2 by FDA under an Emergency Use Authorization (EUA). This EUA  will remain  in effect (meaning this test can be used) for the duration of the COVID-19 declaration under Section 564(b)(1) of the Act, 21 U.S.C. section 360bbb-3(b)(1), unless  the authorization is terminated or revoked sooner.    Influenza A by PCR NEGATIVE NEGATIVE Final   Influenza B by PCR NEGATIVE NEGATIVE Final    Comment: (NOTE) The Xpert Xpress SARS-CoV-2/FLU/RSV assay is intended as an aid in  the diagnosis of influenza from Nasopharyngeal swab specimens and  should not be used as a sole basis for treatment. Nasal washings and  aspirates are unacceptable for Xpert Xpress SARS-CoV-2/FLU/RSV  testing. Fact Sheet for Patients: PinkCheek.be Fact Sheet for Healthcare Providers: GravelBags.it This test is not yet approved or cleared by the [place] FDA and  has been authorized for detection and/or diagnosis of SARS-CoV-2 by  FDA under an Emergency Use Authorization (EUA). This EUA will remain  in effect (meaning this test can be used) for the duration of the  Covid-19 declaration under Section 564(b)(1) of the Act, 21  U.S.C. section 360bbb-3(b)(1), unless the authorization is  terminated or revoked. Performed at [facility], [address] [place], [place] [address]   Culture, blood (Routine X 2) w Reflex to ID Panel     Status: None (Preliminary result)   Collection Time: 10/09/19 12:31 AM   Specimen: BLOOD LEFT WRIST  Result Value Ref Range Status   Specimen Description BLOOD LEFT WRIST  Final   Special Requests   Final    BOTTLES DRAWN AEROBIC AND ANAEROBIC Blood Culture adequate volume   Culture   Final    NO GROWTH 2 DAYS Performed at [facility], [address], [place], [place] [address]    Report Status PENDING  Incomplete  Culture, blood (Routine X 2) w Reflex to ID Panel     Status: None (Preliminary result)   Collection Time: 10/09/19 12:33 AM   Specimen: BLOOD LEFT HAND   Result Value Ref Range Status   Specimen Description BLOOD LEFT HAND  Final   Special Requests   Final    BOTTLES DRAWN AEROBIC AND ANAEROBIC Blood Culture adequate volume   Culture   Final    NO GROWTH 2 DAYS Performed at [facility], [address] [place], [place] [address]    Report Status PENDING  Incomplete    Coagulation Studies: Recent Labs    10/11/19 0445  LABPROT 14.1  INR 1.1    Urinalysis:  Recent Labs  Lab 10/07/19 0727  COLORURINE YELLOW*  LABSPEC 1.020  PHURINE 5.0  GLUCOSEU NEGATIVE  HGBUR MODERATE*  BILIRUBINUR NEGATIVE  KETONESUR NEGATIVE  PROTEINUR 100*  NITRITE NEGATIVE  LEUKOCYTESUR NEGATIVE    Lipid Panel:     Component Value Date/Time   CHOL 173 11/09/2018 0849   TRIG 47.0 11/09/2018 0849   HDL 86.10 11/09/2018 0849   CHOLHDL 2 11/09/2018 0849   VLDL 9.4 11/09/2018 0849   LDLCALC 77 11/09/2018 0849    HgbA1C:  Lab Results  Component Value Date   HGBA1C 6.8 (H) 10/07/2019    Urine Drug Screen:  No results found for: LABOPIA, COCAINSCRNUR, LABBENZ, AMPHETMU, THCU, LABBARB  Alcohol Level: No results for input(s): ETH in the last 168 hours.  Other results: EKG: normal EKG, normal sinus rhythm, unchanged from previous tracings.  Imaging: No results found.   Assessment/Plan:  86 y.o. male  with medical history significant for hypertension, diabetes mellitus and chronic low back pain who presents to the emergency room for evaluation of worsening low back pain and generalized weakness.  Patient also complains of pain in his neck which is new.  Per EMS patient has had 2  falls in the past week  but patient is unable to recall the details of the fall, he was unable to state if he hit his head or his neck when he fell. He denies having any fever, cough, chest pain, shortness of breath, abdominal pain, nausea, vomiting, diarrhea or any urinary symptoms. He has been having progressive swallowing difficulty and significant  findings on MRI C spine that could be associated with delayed swallowing.    - MRI reviewed and there is good amount of motion artifact but I don't see anything new on there or acute. - I don't think repeating MRI brain will changing anything  - probably degenerative, progressive disease and deconditioning and swallowing issues - s/p discussion with family regarding PEG tube placement that family and pt agrees on.  10/11/2019, 11:19 AM

## 2019-10-11 NOTE — Progress Notes (Signed)
Went to see patient. Off the floor  He is getting PEG and TEE  BP (!) 165/91 (BP Location: Left Arm)   Pulse 72   Temp 97.9 F (36.6 C) (Oral)   Resp 18   Ht 5\' 11"  (1.803 m)   Wt 87.1 kg   SpO2 94%   BMI 26.78 kg/m     CBC Latest Ref Rng & Units 10/11/2019 10/10/2019 10/09/2019  WBC 4.0 - 10.5 K/uL 12.7(H) 15.5(H) 21.0(H)  Hemoglobin 13.0 - 17.0 g/dL 16.3 16.7 15.1  Hematocrit 39.0 - 52.0 % 48.2 48.2 44.7  Platelets 150 - 400 K/uL 94(L) 74(L) 80(L)    CMP Latest Ref Rng & Units 10/11/2019 10/09/2019 10/08/2019  Glucose 70 - 99 mg/dL 187(H) 164(H) 148(H)  BUN 8 - 23 mg/dL 54(H) 42(H) 48(H)  Creatinine 0.61 - 1.24 mg/dL 1.38(H) 1.22 1.61(H)  Sodium 135 - 145 mmol/L 138 135 135  Potassium 3.5 - 5.1 mmol/L 4.1 4.3 4.2  Chloride 98 - 111 mmol/L 107 105 104  CO2 22 - 32 mmol/L 23 24 23   Calcium 8.9 - 10.3 mg/dL 8.0(L) 8.0(L) 8.3(L)  Total Protein 6.5 - 8.1 g/dL 5.8(L) - -  Total Bilirubin 0.3 - 1.2 mg/dL 1.1 - -  Alkaline Phos 38 - 126 U/L 86 - -  AST 15 - 41 U/L 32 - -  ALT 0 - 44 U/L 40 - -   Impression /recommendation Group B streptococcus bacteremia.  Unclear source.  He has no skin lesions.  He has cervical and lumbar pain but MRI without contrast did not show any discitis or paraspinal abscess but I am still concerned that he may have a deep-seated infection.  2D echo has been done and suboptimal study and EF is low. He is awaiting TEE to rule out endocarditis.  Repeat blood cultures sent to 10/09/2019 -neg so far Would get a CT abdomen and pelvis to look for any source if TEE negative. Currently on pencillin continuous infusion- Ceftriaxone is a QD option on discharge. Would need total of 4 weeks -6 weeks of antibiotic depending on finding a source   Dysphagia/dysphonia-due toRt vocal cord paralysis- unclear etiology ? CVA R/o malignancy   Cardiomyopathy with EF of 25%- seen by cardiology started carvedilol  History of fall   AKI on CKD improving  History of right  adrenal gland tumor status post adrenalectomy 2006.  I will be away Friday- Monday. Dr.Snider available on phone.

## 2019-10-11 NOTE — Progress Notes (Signed)
OT Cancellation Note  Patient Details Name: Dustin Harrison MRN: [id number] DOB: 1931-12-06   Cancelled Treatment:    Reason Eval/Treat Not Completed: Patient at procedure or test/ unavailable  Pt off floor for PEG tube placement at this time. Will f/u as able for OT treatment. Thank you.  Gerrianne Scale, [place], OTR/L ascom [phone] 10/11/19, 3:03 PM

## 2019-10-11 NOTE — Consult Note (Signed)
[facility] SURGICAL CONSULTATION NOTE (initial) - [id number]   HISTORY OF PRESENT ILLNESS (HPI):  86 y.o. male who initially presented to [facility] ED on 05/02 for evaluation of back pain, neck pain, and falls. Patient presented to the ED after reporting 2 falls in the preceding days. He was unable to recall the details of these. He denied any associated symptoms at time of presentation. No fever, cough, chest pain, shortness of breath, abdominal pain, nausea, vomiting, diarrhea or any urinary symptoms. No neurological complaints following his falls. Work up in the ED including MRI showed degenerative changes of his lumbar and cervical spine with nerve impingement on multiple levels. He additionally was found to have an acute on chronic kidney injury. He was admitted to the medicine for further work and management. He has been evaluated by SLP and found to have dysphagia and by ENT (Der Richardson Landry) and found to have vocal cord paralysis. Only previous intra-abdominal surgeries include a right adrenal ectomy about 20 years ago.   Surgery is consulted by hospitalist physician Dr. Markus Jarvis, MD in this context for evaluation for placement of PEG tube.  PAST MEDICAL HISTORY (PMH):  Past Medical History:  Diagnosis Date  . Abnormal glucose   . Arthritis   . Baker's cyst of knee    left  . BPH (benign prostatic hypertrophy)   . Cough    because of "tight" esophagus  . Diabetes mellitus   . Glaucoma   . HOH (hard of hearing)   . Hypertension   . Pheochromocytoma of right adrenal gland   . Ulcer   . Wears hearing aid    bilateral     PAST SURGICAL HISTORY ([place]):  Past Surgical History:  Procedure Laterality Date  . adrenaletomy    . CATARACT EXTRACTION W/PHACO Left 10/08/2015   Procedure: CATARACT EXTRACTION PHACO AND INTRAOCULAR LENS PLACEMENT ([place]) left eye;  Surgeon: Leandrew Koyanagi, MD;  Location: [facility];  Service: Ophthalmology;  Laterality: Left;  MALYUGIN  .  HERNIA REPAIR    . SQUAMOUS CELL CARCINOMA EXCISION  03-2006   left ear  . TONSILLECTOMY       MEDICATIONS:  Prior to Admission medications   Medication Sig Start Date End Date Taking? Authorizing Provider  atorvastatin (LIPITOR) 20 MG tablet TAKE ONE TABLET EVERY DAY 07/31/19  Yes Bedsole, Amy E, MD  finasteride (PROSCAR) 5 MG tablet TAKE ONE TABLET EVERY DAY 07/31/19  Yes Bedsole, Amy E, MD  losartan (COZAAR) 100 MG tablet TAKE ONE TABLET EVERY DAY 07/31/19  Yes Bedsole, Amy E, MD  Polyethyl Glycol-Propyl Glycol (SYSTANE) 0.4-0.3 % GEL ophthalmic gel Place 1 application into both eyes.   Yes [provider]  Psyllium (METAMUCIL FIBER PO) Take 2 Scoops by mouth daily.   Yes [provider]  tamsulosin (FLOMAX) 0.4 MG CAPS capsule TAKE 2 CAPSULES EVERY DAY 07/31/19  Yes Bedsole, Amy E, MD  timolol (TIMOPTIC) 0.5 % ophthalmic solution Place 1 drop into both eyes 2 (two) times daily.  10/30/11  Yes [provider]  acetaminophen (TYLENOL) 500 MG tablet Take 500 mg by mouth daily as needed.    [provider]     ALLERGIES:  No Known Allergies   SOCIAL HISTORY:  Social History   Socioeconomic History  . Marital status: Married    Spouse name: Not on file  . Number of children: Not on file  . Years of education: Not on file  . Highest education level: Not on file  Occupational History  . Occupation: chaplain  Tobacco Use  . Smoking status: Never Smoker  . Smokeless tobacco: Never Used  Substance and Sexual Activity  . Alcohol use: Yes    Alcohol/week: 7.0 standard drinks    Types: 7 Glasses of wine per week  . Drug use: No  . Sexual activity: Not Currently  Other Topics Concern  . Not on file  Social History Narrative   Regular exercise- yes- 3-4 times a week treadmill/walk   Diet: fruits and veggies,water   Living will, HCPOA (wife) , full code (reviewed 27)   Social Determinants of Health   Financial Resource Strain:   . Difficulty of  Paying Living Expenses:   Food Insecurity:   . Worried About Charity fundraiser in the Last Year:   . Arboriculturist in the Last Year:   Transportation Needs:   . Film/video editor (Medical):   Marland Kitchen Lack of Transportation (Non-Medical):   Physical Activity:   . Days of Exercise per Week:   . Minutes of Exercise per Session:   Stress:   . Feeling of Stress :   Social Connections:   . Frequency of Communication with Friends and Family:   . Frequency of Social Gatherings with Friends and Family:   . Attends Religious Services:   . Active Member of Clubs or Organizations:   . Attends Archivist Meetings:   Marland Kitchen Marital Status:   Intimate Partner Violence:   . Fear of Current or Ex-Partner:   . Emotionally Abused:   Marland Kitchen Physically Abused:   . Sexually Abused:      FAMILY HISTORY:  Family History  Problem Relation Age of Onset  . Alcohol abuse Mother   . Coronary artery disease Mother   . Brain cancer Father        tumor  . Diabetes Brother       REVIEW OF SYSTEMS:  Review of Systems  Constitutional: Negative for chills and fever.  HENT: Negative for congestion and sore throat.        + Trouble swallowing  Cardiovascular: Negative for chest pain and palpitations.  Gastrointestinal: Negative for abdominal pain, diarrhea, nausea and vomiting.  Genitourinary: Negative for dysuria and urgency.  Musculoskeletal: Positive for back pain, falls and neck pain.  Neurological: Positive for sensory change.  All other systems reviewed and are negative.   VITAL SIGNS:  Temp:  [97.6 F (36.4 C)-98.4 F (36.9 C)] 98.4 F (36.9 C) (05/06 0824) Pulse Rate:  [75-86] 86 (05/06 0824) Resp:  [12-22] 12 (05/06 0824) BP: (154-161)/(87-97) 161/94 (05/06 0824) SpO2:  [94 %-97 %] 97 % (05/06 0824)     Height: 5\' 11"  (180.3 cm) Weight: 87.1 kg BMI (Calculated): 26.79   INTAKE/OUTPUT:  05/05 0701 - 05/06 0700 In: 845 [IV Piggyback:845] Out: 1550 [id number]  PHYSICAL EXAM:   Physical Exam Vitals and nursing note reviewed.  Constitutional:      General: He is not in acute distress.    Appearance: Normal appearance. He is normal weight. He is not ill-appearing.     Comments: Family at bedside  Eyes:     General: No scleral icterus.    Conjunctiva/sclera: Conjunctivae normal.  Cardiovascular:     Rate and Rhythm: Normal rate and regular rhythm.  Pulmonary:     Effort: Pulmonary effort is normal. No respiratory distress.     Breath sounds: Normal breath sounds.  Abdominal:     General: Abdomen is flat. A surgical  scar is present. There is no distension.     Tenderness: There is no abdominal tenderness. There is no guarding or rebound.  Genitourinary:    Comments: Deferred Musculoskeletal:        General: Normal range of motion.     Right lower leg: No edema.     Left lower leg: No edema.     Comments: Chronic contracture   Skin:    General: Skin is warm and dry.     Coloration: Skin is not pale.     Findings: No erythema.  Neurological:     General: No focal deficit present.     Mental Status: He is alert and oriented to person, place, and time.  Psychiatric:        Mood and Affect: Mood normal.        Behavior: Behavior normal.      Labs:  CBC Latest Ref Rng & Units 10/11/2019 10/10/2019 10/09/2019  WBC 4.0 - 10.5 K/uL 12.7(H) 15.5(H) 21.0(H)  Hemoglobin 13.0 - 17.0 g/dL 16.3 16.7 15.1  Hematocrit 39.0 - 52.0 % 48.2 48.2 44.7  Platelets 150 - 400 K/uL 94(L) 74(L) 80(L)   CMP Latest Ref Rng & Units 10/11/2019 10/09/2019 10/08/2019  Glucose 70 - 99 mg/dL 187(H) 164(H) 148(H)  BUN 8 - 23 mg/dL 54(H) 42(H) 48(H)  Creatinine 0.61 - 1.24 mg/dL 1.38(H) 1.22 1.61(H)  Sodium 135 - 145 mmol/L 138 135 135  Potassium 3.5 - 5.1 mmol/L 4.1 4.3 4.2  Chloride 98 - 111 mmol/L 107 105 104  CO2 22 - 32 mmol/L 23 24 23   Calcium 8.9 - 10.3 mg/dL 8.0(L) 8.0(L) 8.3(L)  Total Protein 6.5 - 8.1 g/dL 5.8(L) - -  Total Bilirubin 0.3 - 1.2 mg/dL 1.1 - -  Alkaline Phos 38  - 126 U/L 86 - -  AST 15 - 41 U/L 32 - -  ALT 0 - 44 U/L 40 - -     Imaging studies:  No pertinent imaging studies   Assessment/Plan: (ICD-10's: R13.10) 86 y.o. male with severe dysphagia and vocal cord paralysis requiring PEG tube placement   - Will plan on robotic assisted laparoscopic placement of G-tube with Dr Dahlia Byes this afternoon pending anesthesia and OR availability   - All risks, benefits, and alternatives to above procedure(s) were discussed with the patient and his family, all of their questions were answered to their expressed satisfaction, patient expresses he wishes to proceed, and informed consent was obtained.  - Remain NPO; typically initiate tube feedings within 48 hours of placement  - Cefotetan on call to OR   - Further management per primary and consulting services  All of the above findings and recommendations were discussed with the patient and his family, and all of their questions were answered to their expressed satisfaction.  Thank you for the opportunity to participate in this patient's care.   -- Edison Simon, PA-C [facility] 10/11/2019, 11:16 AM [phone] M-F: 7am - 4pm

## 2019-10-11 NOTE — Op Note (Signed)
Robotic assisted laparoscopic gastrostomy tube with repair of ventral hernia   Pre-operative Diagnosis: malnutrition   Post-operative Diagnosis: same   Procedure:  Robotic assisted laparoscopic G tube 18 Fr w repair of ventral hernia   Surgeon: Caroleen Hamman, MD FACS   Anesthesia: Gen. with endotracheal tube   Findings: Significant adhesions from omentum to abd wall Ventral hernia 1.5 cms G tube antrum of the stomach w/o leaks   Estimated Blood Loss:5 cc             Complications: none     Procedure Details  The patient was seen again in the Holding Room. The benefits, complications, treatment options, and expected outcomes were discussed with the Family. The risks of bleeding, infection, recurrence of symptoms, failure to resolve symptoms, bowel injury, any of which could require further surgery were reviewed .   The  family concurred with the proposed plan, giving informed consent.  The patient was taken to Operating Room, identified  and the procedure verified. A Time Out was held and the above information confirmed.   Prior to the induction of general anesthesia, antibiotic prophylaxis was administered. VTE prophylaxis was in place. General endotracheal anesthesia was then administered and tolerated well. After the induction, the abdomen was prepped with Chloraprep and draped in the sterile fashion. The patient was positioned in the supine position.   Infraumbilical Cut down technique was used to enter the abdominal cavity , we identified a 1.5 cms ventral hernia and sac was dissected and excised. A Hasson trochar was placed after two vicryl stitches were anchored to the fascia. Pneumoperitoneum was then created with CO2 and tolerated well without any adverse changes in the patient's vital signs.  Two 8-mm ports were placed under direct vision.  . I grasped the body  stomach and tented it  toward the abdominal wall, small left subcostal incision created to placed Gastrostomy tube  under direct visualization.    The patient was positioned  in reverse Trendelenburg, robot was brought to the surgical field and docked in the standard fashion.  We made sure all the instrumentation was kept indirect view at all times and that there were no collision between the arms. I scrubbed out and went to the console. Inspection revealed significant adhesions that were lysed with scissors, this part took [place] about 25 minutes of total operative time Using the scissors I performed a gastrotomy and confirmed that I was intraluminally. An inner pursestring suture was placed using 3 0 V-Loc suture around the Gastrotomy . I placed the G-tube through the gastrotomy in direct fashion.  I was able to get my scrub to flush saline via the G-tube confirming the proper position of the tube intraluminally.  The balloon was inflated and pulled back.  We then were able to tied the first pursestring in the standard fashion and completing our circumferential outer pursestring with the 3-0 V-Loc sutures Inspection of the  upper quadrant was performed. No bleeding, bile duct injury or leak, or bowel injury was noted.  All the needles were removed under direct visualization. Robotic instruments and robotic arms were undocked in the standard fashion.  I scrubbed back in. Liposomal Marcaine was used to infiltrate the abdominal wall at all incision sites   Pneumoperitoneum was released.  The ventral defect was closed with interrumpted 0 ethibond sutures. Please note that I deviated with my approach and used a periumbilical incision so we could address the ventrl hernia as well. 4-0 subcuticular Monocryl was used to close  the skin. Dermabond was  applied.  The patient was then extubated and brought to the recovery room in stable condition. Sponge, lap, and needle counts were correct at closure and at the conclusion of the case.               Caroleen Hamman, MD, FACS

## 2019-10-11 NOTE — Progress Notes (Signed)
PROGRESS NOTE    Dustin Harrison  SBB:[id number] DOB: 25-Nov-1931 DOA: 10/07/2019 PCP: Jinny Sanders, MD (Confirm with patient/family/NH records and if not entered, this HAS to be entered at [facility] point of entry. "No PCP" if truly none.)   Brief Narrative: (Start on day 1 of progress note - keep it brief and live) Dustin Harrison a 86 y.o.malewith medical history significant forhypertension, diabetes mellitus and chronic low back pain who presents to the emergency room for evaluation of worsening low back pain and generalized weakness. Patient had  noncontrasted MRI of cervical, thoracic and lumbar spines, showed a significant degenerative disc disease, but no evidence of osteomyelitis. Patient also had positive blood culture with group B streptococcus. Penicillin G is started after consultation with GI. 5/6. Patient continued to have significant dysphagia, patient was seen by ENT, patient had a significant right vocal cord paralysis.    Assessment & Plan:   Principal Problem:   AKI (acute kidney injury) ([place]) Active Problems:   Diabetes mellitus without complication ([place])   Essential hypertension, benign   CKD (chronic kidney disease) stage 2, GFR 60-89 ml/min   Back pain   Leukocytosis   Cardiomyopathy ([place])   Bacteremia   HFrEF (heart failure with reduced ejection fraction) ([place])  #1. Group B streptococcus bacteremia. Source of infection still unclear. Oncology consult appreciated, elevated immature granulocytes most likely secondary to infection. Discussed with cardiology, coordinating for TEE. Also discussed with ID, will consider CT abdomen/pelvis with contrast in the near future if still not able to identify the source of infection. Continue antibiotics with penicillin G.  #2. Dysphagia. Discussed with neurology, MRI of brain will not be helpful due to implant. However, dysphagia probably will be persistent and progressive. I have a consult general surgery for PEG tube  placement. At the meantime, I will place patient on some IV fluids to avoid dehydration while keep patient n.p.o.  #3. Chronic systolic congestive heart failure with ejection fraction 25 to 30%. Today, patient does not have any CHF exacerbation. He is n.p.o., will give gentle rehydration.  #4. Thrombocytopenia. Appreciate oncology consult, appears to be acute on chronic from infection. Will follow.  5. Acute kidney injury. Slightly worsening renal function today. We will continue some IV fluids.  6. Type 2 diabetes. Continue sliding scale insulin.  7. Essential hypertension. Will follow blood pressure. Patient currently n.p.o., add the clonidine patch.  8. Right-sided weakness. Followed by neurology.   DVT prophylaxis: SCDs Code Status: Full Family Communication: Discussed with patient, all questions answered. Disposition Plan:   Patient came from:      Home                                                                                                           Anticipated d/c place: Home  Barriers to d/c OR conditions which need to be met to effect a safe d/c:   Consultants:   Neurology  Cardiology  Infectious disease  GI  ENT  Hematology  Procedures: None Antimicrobials: Penicillin G.  Subjective: Patient was placed on n.p.o. after seen by ENT yesterday. He feels dry mouth. Otherwise, he has no nausea vomiting or diarrhea. He had some constipation. No short of breath or cough.  Objective: Vitals:   10/10/19 1249 10/10/19 2022 10/11/19 0408 10/11/19 0824  BP: (!) 157/97 (!) 160/94 (!) 154/87 (!) 161/94  Pulse: 75 79 82 86  Resp: 18 20 (!) 22 12  Temp: 97.6 F (36.4 C) 97.9 F (36.6 C) 98.3 F (36.8 C) 98.4 F (36.9 C)  TempSrc:   Oral   SpO2: 97% 94% 96% 97%  Weight:      Height:        Intake/Output Summary (Last 24 hours) at 10/11/2019 1015 Last data filed at 10/11/2019 0700 Gross per 24 hour  Intake 845 ml  Output 1350 ml  Net  -505 ml   Filed Weights   10/07/19 0338  Weight: 87.1 kg    Examination:  General exam: Appears calm and comfortable  Respiratory system: Clear to auscultation. Respiratory effort normal. Cardiovascular system: S1 & S2 heard, RRR. No JVD, murmurs, rubs, gallops or clicks. No pedal edema. Gastrointestinal system: Abdomen is nondistended, soft and nontender. No organomegaly or masses felt. Normal bowel sounds heard. Central nervous system: Alert and oriented. No focal neurological deficits. Extremities: Right hand weakness. Skin: No rashes, lesions or ulcers Psychiatry: Judgement and insight appear normal. Mood & affect appropriate.     Data Reviewed: I have personally reviewed following labs and imaging studies  CBC: Recent Labs  Lab 10/07/19 0727 10/08/19 0511 10/09/19 0033 10/10/19 0415 10/11/19 0445  WBC 28.1* 23.5* 21.0* 15.5* 12.7*  NEUTROABS 24.3*  --   --   --  10.3*  HGB 14.4 15.1 15.1 16.7 16.3  HCT 43.4 44.9 44.7 48.2 48.2  MCV 93.3 91.6 90.9 89.3 90.6  PLT 121* 99* 80* 74* 94*   Basic Metabolic Panel: Recent Labs  Lab 10/07/19 0727 10/08/19 0511 10/09/19 0033 10/11/19 0445  NA 134* 135 135 138  K 5.0 4.2 4.3 4.1  CL 102 104 105 107  CO2 _0 GLUCOSE 140* 148* 164* 187*  BUN 66* 48* 42* 54*  CREATININE 2.44* 1.61* 1.22 1.38*  CALCIUM 8.6* 8.3* 8.0* 8.0*  MG  --   --   --  2.6*   GFR: Estimated Creatinine Clearance: 41.7 mL/min (A) (by C-G formula based on SCr of 1.38 mg/dL (H)). Liver Function Tests: Recent Labs  Lab 10/11/19 0445  AST 32  ALT 40  ALKPHOS 86  BILITOT 1.1  PROT 5.8*  ALBUMIN 2.5*   No results for input(s): LIPASE, AMYLASE in the last 168 hours. No results for input(s): AMMONIA in the last 168 hours. Coagulation Profile: Recent Labs  Lab 10/11/19 0445  INR 1.1   Cardiac Enzymes: Recent Labs  Lab 10/07/19 0727  CKTOTAL 305   BNP (last 3 results) No results for input(s): PROBNP in the last 8760  hours. HbA1C: No results for input(s): HGBA1C in the last 72 hours. CBG: Recent Labs  Lab 10/10/19 0733 10/10/19 1202 10/10/19 1726 10/10/19 2058 10/11/19 0820  GLUCAP 167* 157* 91 158* 188*   Lipid Profile: No results for input(s): CHOL, HDL, LDLCALC, TRIG, CHOLHDL, LDLDIRECT in the last 72 hours. Thyroid Function Tests: No results for input(s): TSH, T4TOTAL, FREET4, T3FREE, THYROIDAB in the last 72 hours. Anemia Panel: No results for input(s): VITAMINB12, FOLATE, FERRITIN, TIBC, IRON, RETICCTPCT in the last 72 hours. Sepsis Labs: Recent Labs  Lab 10/07/19  0831  LATICACIDVEN 1.7    Recent Results (from the past 240 hour(s))  Urine culture     Status: None   Collection Time: 10/07/19  7:27 AM   Specimen: Urine, Random  Result Value Ref Range Status   Specimen Description   Final    URINE, RANDOM Performed at [facility], [address], [place], [place] [address]    Special Requests   Final    NONE Performed at [facility], [address] [place], [place] [address]    Culture   Final    NO GROWTH Performed at [facility], [place] [address] [place], [place] [address]    Report Status 10/08/2019 FINAL  Final  Blood Culture (routine x 2)     Status: Abnormal   Collection Time: 10/07/19  8:31 AM   Specimen: BLOOD  Result Value Ref Range Status   Specimen Description   Final    BLOOD LAC Performed at [facility], [address] [place], [place] [address]    Special Requests   Final    BOTTLES DRAWN AEROBIC AND ANAEROBIC Blood Culture adequate volume Performed at [facility], [address] [place], [place] [address]    Culture  Setup Time   Final    GRAM POSITIVE COCCI IN BOTH AEROBIC AND ANAEROBIC BOTTLES CRITICAL RESULT CALLED TO, READ BACK BY AND VERIFIED WITH: ASAJAH DUNCAN ON 10/07/2019 AT 2035 TIK Performed at [facility], [address] [place], [place] [address]    Culture (A)  Final     GROUP B STREP(S.AGALACTIAE)ISOLATED SUSCEPTIBILITIES PERFORMED ON PREVIOUS CULTURE WITHIN THE LAST 5 DAYS. Performed at [facility], [place] [address], [place], [place] [address]    Report Status 10/09/2019 FINAL  Final  Blood Culture (routine x 2)     Status: Abnormal   Collection Time: 10/07/19  8:31 AM   Specimen: BLOOD  Result Value Ref Range Status   Specimen Description   Final    BLOOD LWRIST Performed at [facility], [address], [place], [place] [address]    Special Requests   Final    BOTTLES DRAWN AEROBIC AND ANAEROBIC Blood Culture adequate volume Performed at [facility], [address] [place], [place] [address]    Culture  Setup Time   Final    GRAM POSITIVE COCCI IN BOTH AEROBIC AND ANAEROBIC BOTTLES CRITICAL RESULT CALLED TO, READ BACK BY AND VERIFIED WITH: ASAJAH DUNCAN ON 10/07/2019 AT 2035 TIK Performed at [facility], [place] [address] [place],  [address]    Culture GROUP B STREP(S.AGALACTIAE)ISOLATED (A)  Final   Report Status 10/09/2019 FINAL  Final   Organism ID, Bacteria GROUP B STREP(S.AGALACTIAE)ISOLATED  Final      Susceptibility   Group b strep(s.agalactiae)isolated - MIC*    CLINDAMYCIN <=0.25 SENSITIVE Sensitive     AMPICILLIN <=0.25 SENSITIVE Sensitive     ERYTHROMYCIN <=0.12 SENSITIVE Sensitive     VANCOMYCIN 0.5 SENSITIVE Sensitive     CEFTRIAXONE <=0.12 SENSITIVE Sensitive     LEVOFLOXACIN 1 SENSITIVE Sensitive     PENICILLIN Value in next row Sensitive      SENSITIVE0.06    * GROUP B STREP(S.AGALACTIAE)ISOLATED  Blood Culture ID Panel (Reflexed)     Status: Abnormal   Collection Time: 10/07/19  8:31 AM  Result Value Ref Range Status   Enterococcus species NOT DETECTED NOT DETECTED Final   Listeria monocytogenes NOT DETECTED NOT DETECTED Final  Staphylococcus species NOT DETECTED NOT DETECTED Final   Staphylococcus aureus (BCID) NOT DETECTED NOT DETECTED Final   Streptococcus species DETECTED  (A) NOT DETECTED Final    Comment: CRITICAL RESULT CALLED TO, READ BACK BY AND VERIFIED WITH: ASAJAH DUNCAN ON 10/07/2019 AT 2035 TIK    Streptococcus agalactiae DETECTED (A) NOT DETECTED Final    Comment: CRITICAL RESULT CALLED TO, READ BACK BY AND VERIFIED WITH: ASAJAH DUNCAN ON 10/07/2019 AT 2035 TIK    Streptococcus pneumoniae NOT DETECTED NOT DETECTED Final   Streptococcus pyogenes NOT DETECTED NOT DETECTED Final   Acinetobacter baumannii NOT DETECTED NOT DETECTED Final   Enterobacteriaceae species NOT DETECTED NOT DETECTED Final   Enterobacter cloacae complex NOT DETECTED NOT DETECTED Final   Escherichia coli NOT DETECTED NOT DETECTED Final   Klebsiella oxytoca NOT DETECTED NOT DETECTED Final   Klebsiella pneumoniae NOT DETECTED NOT DETECTED Final   Proteus species NOT DETECTED NOT DETECTED Final   Serratia marcescens NOT DETECTED NOT DETECTED Final   Haemophilus influenzae NOT DETECTED NOT DETECTED Final   Neisseria meningitidis NOT DETECTED NOT DETECTED Final   Pseudomonas aeruginosa NOT DETECTED NOT DETECTED Final   Candida albicans NOT DETECTED NOT DETECTED Final   Candida glabrata NOT DETECTED NOT DETECTED Final   Candida krusei NOT DETECTED NOT DETECTED Final   Candida parapsilosis NOT DETECTED NOT DETECTED Final   Candida tropicalis NOT DETECTED NOT DETECTED Final    Comment: Performed at [facility], [address] [place], [place] [address]  Respiratory Panel by RT PCR (Flu A&B, Covid) - Nasopharyngeal Swab     Status: None   Collection Time: 10/07/19 12:27 PM   Specimen: Nasopharyngeal Swab  Result Value Ref Range Status   SARS Coronavirus 2 by RT PCR NEGATIVE NEGATIVE Final    Comment: (NOTE) SARS-CoV-2 target nucleic acids are NOT DETECTED. The SARS-CoV-2 RNA is generally detectable in upper respiratoy specimens during the acute phase of infection. The lowest concentration of SARS-CoV-2 viral copies this assay can detect is 131 copies/mL. A negative  result does not preclude SARS-Cov-2 infection and should not be used as the sole basis for treatment or other patient management decisions. A negative result may occur with  improper specimen collection/handling, submission of specimen other than nasopharyngeal swab, presence of viral mutation(s) within the areas targeted by this assay, and inadequate number of viral copies (<131 copies/mL). A negative result must be combined with clinical observations, patient history, and epidemiological information. The expected result is Negative. Fact Sheet for Patients:  PinkCheek.be Fact Sheet for Healthcare Providers:  GravelBags.it This test is not yet ap proved or cleared by the [place] FDA and  has been authorized for detection and/or diagnosis of SARS-CoV-2 by FDA under an Emergency Use Authorization (EUA). This EUA will remain  in effect (meaning this test can be used) for the duration of the COVID-19 declaration under Section 564(b)(1) of the Act, 21 U.S.C. section 360bbb-3(b)(1), unless the authorization is terminated or revoked sooner.    Influenza A by PCR NEGATIVE NEGATIVE Final   Influenza B by PCR NEGATIVE NEGATIVE Final    Comment: (NOTE) The Xpert Xpress SARS-CoV-2/FLU/RSV assay is intended as an aid in  the diagnosis of influenza from Nasopharyngeal swab specimens and  should not be used as a sole basis for treatment. Nasal washings and  aspirates are unacceptable for Xpert Xpress SARS-CoV-2/FLU/RSV  testing. Fact Sheet for Patients: PinkCheek.be Fact Sheet for Healthcare Providers: GravelBags.it This test is not yet approved or cleared  by the [place] and  has been authorized for detection and/or diagnosis of SARS-CoV-2 by  FDA under an Emergency Use Authorization (EUA). This EUA will remain  in effect (meaning this test can be used) for the  duration of the  Covid-19 declaration under Section 564(b)(1) of the Act, 21  U.S.C. section 360bbb-3(b)(1), unless the authorization is  terminated or revoked. Performed at [facility], [address] [place], [place] [address]   Culture, blood (Routine X 2) w Reflex to ID Panel     Status: None (Preliminary result)   Collection Time: 10/09/19 12:31 AM   Specimen: BLOOD LEFT WRIST  Result Value Ref Range Status   Specimen Description BLOOD LEFT WRIST  Final   Special Requests   Final    BOTTLES DRAWN AEROBIC AND ANAEROBIC Blood Culture adequate volume   Culture   Final    NO GROWTH 2 DAYS Performed at [facility], [address], [place], [place] [address]    Report Status PENDING  Incomplete  Culture, blood (Routine X 2) w Reflex to ID Panel     Status: None (Preliminary result)   Collection Time: 10/09/19 12:33 AM   Specimen: BLOOD LEFT HAND  Result Value Ref Range Status   Specimen Description BLOOD LEFT HAND  Final   Special Requests   Final    BOTTLES DRAWN AEROBIC AND ANAEROBIC Blood Culture adequate volume   Culture   Final    NO GROWTH 2 DAYS Performed at [facility], [address], [place], [place] [address]    Report Status PENDING  Incomplete         Radiology Studies: MR BRAIN WO CONTRAST  Result Date: 10/09/2019 CLINICAL DATA:  Right upper extremity weakness EXAM: MRI HEAD WITHOUT CONTRAST TECHNIQUE: Multiplanar, multiecho pulse sequences of the brain and surrounding structures were obtained without intravenous contrast. COMPARISON:  None. FINDINGS: There is substantial artifact related to left cochlear implant. Majority of the brain is obscured on diffusion weighted imaging. Susceptibility weighted imaging is nondiagnostic. Ventricles appear stable in size. Probable chronic microvascular ischemic changes. IMPRESSION: Substantial artifact related to cochlear implant. Acute infarction causing reported symptoms cannot be excluded.  Electronically Signed   By: Macy Mis M.D.   On: 10/09/2019 11:39        Scheduled Meds: . carvedilol  6.25 mg Oral BID WC  . cyclobenzaprine  5 mg Oral TID  . finasteride  5 mg Oral Daily  . insulin aspart  0-15 Units Subcutaneous TID WC  . insulin aspart  4 Units Subcutaneous TID WC  . lidocaine  1 patch Transdermal Q24H  . losartan  12.5 mg Oral Daily  . psyllium  1 packet Oral Daily  . tamsulosin  0.8 mg Oral Daily  . timolol  1 drop Both Eyes BID   Continuous Infusions: . penicillin g continuous IV infusion 12 Million Units (10/11/19 0010)     LOS: 4 days    Time spent: 45 minutes    Sharen Hones, MD [facility]   To contact the attending provider between 7A-7P or the covering provider during after hours 7P-7A, please log into the web site [email or web address] and access using universal [place] password for that web site. If you do not have the password, please call the hospital operator.  10/11/2019, 10:15 AM

## 2019-10-11 NOTE — Anesthesia Preprocedure Evaluation (Addendum)
Anesthesia Evaluation  Patient identified by MRN, date of birth, ID band  Reviewed: Allergy & Precautions, H&P , NPO status , Patient's Chart, lab work & pertinent test results  Airway Mallampati: II  TM Distance: >3 FB Neck ROM: full    Dental no notable dental hx.    Pulmonary neg pulmonary ROS,    Pulmonary exam normal        Cardiovascular hypertension,  Rhythm:regular Rate:Normal     Neuro/Psych negative neurological ROS  negative psych ROS   GI/Hepatic PUD,   Endo/Other  diabetes  Renal/GU Renal InsufficiencyRenal disease     Musculoskeletal  (+) Arthritis ,   Abdominal   Peds  Hematology   Anesthesia Other Findings Past Medical History: No date: Abnormal glucose No date: Arthritis No date: Baker's cyst of knee     Comment:  left No date: BPH (benign prostatic hypertrophy) No date: Cough     Comment:  because of "tight" esophagus No date: Diabetes mellitus No date: Glaucoma No date: HOH (hard of hearing) No date: Hypertension No date: Pheochromocytoma of right adrenal gland No date: Ulcer No date: Wears hearing aid     Comment:  Bilateral EF 25-30% Dysphagia R vocal cord paralysis  Reproductive/Obstetrics                            Anesthesia Physical  Anesthesia Plan  ASA: III  Anesthesia Plan: General   Post-op Pain Management:    Induction: Intravenous  PONV Risk Score and Plan:   Airway Management Planned: Oral ETT  Additional Equipment:   Intra-op Plan:   Post-operative Plan: Extubation in OR  Informed Consent: I have reviewed the patients History and Physical, chart, labs and discussed the procedure including the risks, benefits and alternatives for the proposed anesthesia with the patient or authorized representative who has indicated his/her understanding and acceptance.       Plan Discussed with: CRNA  Anesthesia Plan Comments:          Anesthesia Quick Evaluation

## 2019-10-11 NOTE — Progress Notes (Signed)
Physical Therapy Treatment Patient Details Name: Dustin Harrison MRN: [id number] DOB: [birth year] Today's Date: 10/11/2019    History of Present Illness Pt. is an 86 y.o. male who presented to ER secondary to progressive neck/back pain and R UE weakness after fall 2 days prior to admission; admitted for management of AKI and bacteremia (unknown source at this time).  C-spine, T-spine and L-spine imaging significant for prominent C3-4 and C4-5 impingement, moderate C2-3 and L4-5 impingement and mild C5-6, C7-T1, L3-4 and L5-S1 impingement    PT Comments    Pt ready for session.  Stated he is getting feeding tube this am but wants to walk prior.  OOB with min guard/assist.  Steady in sitting.  Is able to stand to forearm crutch (L side only) and walk 80' with decreased step height and length.  Generally stiff cautious gait.  He self limits today due to fear of fatigue and having to undergo procedure within the hour but is motivated to move prior.  CIR remains appropriate for discharge.   Follow Up Recommendations  CIR     Equipment Recommendations  Other (comment)    Recommendations for Other Services       Precautions / Restrictions Precautions Precautions: Fall Precaution Comments: NPO Restrictions Weight Bearing Restrictions: No    Mobility  Bed Mobility Overal bed mobility: Needs Assistance Bed Mobility: Supine to Sit;Sit to Supine     Supine to sit: Min guard;Min assist Sit to supine: Min guard      Transfers Overall transfer level: Needs assistance Equipment used: 1 person hand held assist Transfers: Sit to/from Stand Sit to Stand: Mod assist;Min assist         General transfer comment: multiple attempts, use of momentum and L UE support required to complete; maintains broad BOS and continues with limited lumbar extension  Ambulation/Gait Ambulation/Gait assistance: Min assist Gait Distance (Feet): 80 Feet Assistive device: Lofstrands   Gait velocity:  decreased   General Gait Details: one loftstrand.  short choppy steps with decreased balance and confidence.  Fatigue limits activity. - feeding tube scheduled this am and he is aftaid of overdoing it before proceedure.   Stairs             Wheelchair Mobility    Modified Rankin (Stroke Patients Only)       Balance Overall balance assessment: Needs assistance Sitting-balance support: No upper extremity supported;Feet supported Sitting balance-Leahy Scale: Good     Standing balance support: Single extremity supported Standing balance-Leahy Scale: Poor Standing balance comment: Able to manage urinal in L hand for toileting in standing requiring CGAx 1-2                            Cognition Arousal/Alertness: Awake/alert Behavior During Therapy: WFL for tasks assessed/performed Overall Cognitive Status: Within Functional Limits for tasks assessed                                        Exercises      General Comments        Pertinent Vitals/Pain Pain Assessment: Faces Faces Pain Scale: Hurts even more Pain Location: R wrist/hand Pain Descriptors / Indicators: Aching;Grimacing;Guarding Pain Intervention(s): Limited activity within patient's tolerance;Monitored during session    Home Living  Prior Function            PT Goals (current goals can now be found in the care plan section) Progress towards PT goals: Progressing toward goals    Frequency    7X/week      PT Plan Current plan remains appropriate    Co-evaluation              AM-PAC PT "6 Clicks" Mobility   Outcome Measure  Help needed turning from your back to your side while in a flat bed without using bedrails?: A Little Help needed moving from lying on your back to sitting on the side of a flat bed without using bedrails?: A Lot Help needed moving to and from a bed to a chair (including a wheelchair)?: A Little Help needed  standing up from a chair using your arms (e.g., wheelchair or bedside chair)?: A Little Help needed to walk in hospital room?: A Lot Help needed climbing 3-5 steps with a railing? : A Lot 6 Click Score: 15    End of Session Equipment Utilized During Treatment: Gait belt Activity Tolerance: Patient tolerated treatment well Patient left: in bed;with call bell/phone within reach;with bed alarm set;with family/visitor present Nurse Communication: Mobility status Hemiplegia - Right/Left: Right Hemiplegia - dominant/non-dominant: Dominant     Time: [id number] PT Time Calculation (min) (ACUTE ONLY): 23 min  Charges:  $Gait Training: 8-22 mins $Therapeutic Activity: 8-22 mins                    Chesley Noon, PTA 10/11/19, 11:49 AM

## 2019-10-12 ENCOUNTER — Inpatient Hospital Stay (HOSPITAL_COMMUNITY)
Admit: 2019-10-12 | Discharge: 2019-10-12 | Disposition: A | Discharge status: Home / Self Care | Payer: Medicare PPO | Attending: Physician Assistant | Admitting: Physician Assistant

## 2019-10-12 ENCOUNTER — Encounter
Admission: EM | Disposition: A | Discharge status: Skilled Nursing Facility | Payer: Self-pay | Source: Home / Self Care | Attending: Internal Medicine

## 2019-10-12 ENCOUNTER — Inpatient Hospital Stay: Payer: Self-pay

## 2019-10-12 DIAGNOSIS — E440 Moderate protein-calorie malnutrition: Secondary | ICD-10-CM | POA: Insufficient documentation

## 2019-10-12 DIAGNOSIS — I350 Nonrheumatic aortic (valve) stenosis: Secondary | ICD-10-CM

## 2019-10-12 DIAGNOSIS — R7881 Bacteremia: Secondary | ICD-10-CM

## 2019-10-12 DIAGNOSIS — E119 Type 2 diabetes mellitus without complications: Secondary | ICD-10-CM

## 2019-10-12 HISTORY — DX: Nonrheumatic aortic (valve) stenosis: I35.0

## 2019-10-12 HISTORY — PX: TEE WITHOUT CARDIOVERSION: SHX5443

## 2019-10-12 LAB — GLUCOSE, CAPILLARY
Glucose-Capillary: 151 mg/dL — ABNORMAL HIGH (ref 70–99)
Glucose-Capillary: 129 mg/dL — ABNORMAL HIGH (ref 70–99)
Glucose-Capillary: 137 mg/dL — ABNORMAL HIGH (ref 70–99)
Glucose-Capillary: 167 mg/dL — ABNORMAL HIGH (ref 70–99)

## 2019-10-12 LAB — CBC WITH DIFFERENTIAL/PLATELET
Abs Immature Granulocytes: 0.24 10*3/uL — ABNORMAL HIGH (ref 0.00–0.07)
Basophils Absolute: 0 10*3/uL (ref 0.0–0.1)
Basophils Relative: 0 %
Eosinophils Absolute: 0 10*3/uL (ref 0.0–0.5)
Eosinophils Relative: 0 %
HCT: 48 % (ref 39.0–52.0)
Hemoglobin: 16.2 g/dL (ref 13.0–17.0)
Immature Granulocytes: 2 %
Lymphocytes Relative: 6 %
Lymphs Abs: 0.9 10*3/uL (ref 0.7–4.0)
MCH: 31.2 pg (ref 26.0–34.0)
MCHC: 33.8 g/dL (ref 30.0–36.0)
MCV: 92.3 fL (ref 80.0–100.0)
Monocytes Absolute: 1.4 10*3/uL — ABNORMAL HIGH (ref 0.1–1.0)
Monocytes Relative: 10 %
Neutro Abs: 11.4 10*3/uL — ABNORMAL HIGH (ref 1.7–7.7)
Neutrophils Relative %: 82 %
Platelets: 120 10*3/uL — ABNORMAL LOW (ref 150–400)
RBC: 5.2 MIL/uL (ref 4.22–5.81)
RDW: 13.2 % (ref 11.5–15.5)
WBC: 13.9 10*3/uL — ABNORMAL HIGH (ref 4.0–10.5)
nRBC: 0 % (ref 0.0–0.2)

## 2019-10-12 LAB — BASIC METABOLIC PANEL
Anion gap: 8 (ref 5–15)
BUN: 47 mg/dL — ABNORMAL HIGH (ref 8–23)
CO2: 25 mmol/L (ref 22–32)
Calcium: 7.8 mg/dL — ABNORMAL LOW (ref 8.9–10.3)
Chloride: 109 mmol/L (ref 98–111)
Creatinine, Ser: 1.42 mg/dL — ABNORMAL HIGH (ref 0.61–1.24)
GFR calc Af Amer: 52 mL/min — ABNORMAL LOW (ref >60)
GFR calc non Af Amer: 45 mL/min — ABNORMAL LOW (ref >60)
Glucose, Bld: 167 mg/dL — ABNORMAL HIGH (ref 70–99)
Potassium: 4.4 mmol/L (ref 3.5–5.1)
Sodium: 142 mmol/L (ref 135–145)

## 2019-10-12 LAB — MAGNESIUM: Magnesium: 2.6 mg/dL — ABNORMAL HIGH (ref 1.7–2.4)

## 2019-10-12 SURGERY — ECHOCARDIOGRAM, TRANSESOPHAGEAL
Anesthesia: Moderate Sedation

## 2019-10-12 MED ORDER — MIDAZOLAM HCL 2 MG/2ML IJ SOLN
INTRAMUSCULAR | Status: AC
  Filled 2019-10-12: qty 2

## 2019-10-12 MED ORDER — LIDOCAINE VISCOUS HCL 2 % MT SOLN
OROMUCOSAL | Status: AC | PRN
  Administered 2019-10-12: 15 mL via OROMUCOSAL

## 2019-10-12 MED ORDER — FENTANYL CITRATE (PF) 100 MCG/2ML IJ SOLN
INTRAMUSCULAR | Status: AC | PRN
  Administered 2019-10-12 (×3): 25 ug via INTRAVENOUS

## 2019-10-12 MED ORDER — FREE WATER
120.0000 mL | Freq: Every day | Status: DC
  Administered 2019-10-12 – 2019-10-16 (×21): 120 mL

## 2019-10-12 MED ORDER — BUTAMBEN-TETRACAINE-BENZOCAINE 2-2-14 % EX AERO
INHALATION_SPRAY | CUTANEOUS | Status: AC | PRN
  Administered 2019-10-12: 2 via TOPICAL

## 2019-10-12 MED ORDER — FENTANYL CITRATE (PF) 100 MCG/2ML IJ SOLN
INTRAMUSCULAR | Status: AC
  Filled 2019-10-12: qty 2

## 2019-10-12 MED ORDER — PRO-STAT SUGAR FREE PO LIQD
30.0000 mL | Freq: Every day | ORAL | Status: DC
  Administered 2019-10-13 – 2019-10-16 (×4): 30 mL

## 2019-10-12 MED ORDER — LIDOCAINE VISCOUS HCL 2 % MT SOLN
OROMUCOSAL | Status: AC
  Filled 2019-10-12: qty 15

## 2019-10-12 MED ORDER — BUTAMBEN-TETRACAINE-BENZOCAINE 2-2-14 % EX AERO
INHALATION_SPRAY | CUTANEOUS | Status: AC
  Filled 2019-10-12: qty 5

## 2019-10-12 MED ORDER — JEVITY 1.5 CAL/FIBER PO LIQD
240.0000 mL | Freq: Every day | ORAL | Status: DC
  Administered 2019-10-12 – 2019-10-16 (×21): 240 mL

## 2019-10-12 MED ORDER — LOSARTAN POTASSIUM 50 MG PO TABS
50.0000 mg | ORAL_TABLET | Freq: Every day | ORAL | Status: DC
  Administered 2019-10-12: 50 mg via ORAL
  Filled 2019-10-12: qty 1

## 2019-10-12 MED ORDER — MIDAZOLAM HCL 5 MG/5ML IJ SOLN
INTRAMUSCULAR | Status: AC | PRN
  Administered 2019-10-12 (×2): 1 mg via INTRAVENOUS

## 2019-10-12 MED ORDER — MIDAZOLAM HCL 2 MG/2ML IJ SOLN
INTRAMUSCULAR | Status: AC | PRN
  Administered 2019-10-12: 1 mg via INTRAVENOUS

## 2019-10-12 MED ORDER — SODIUM CHLORIDE 0.9 % IV SOLN: INTRAVENOUS | Status: DC

## 2019-10-12 MED ORDER — SODIUM CHLORIDE FLUSH 0.9 % IV SOLN
INTRAVENOUS | Status: AC
  Filled 2019-10-12: qty 10

## 2019-10-12 NOTE — Progress Notes (Signed)
Inpatient Rehabilitation-Admissions Coordinator   Per [facility] team, pt's medical workup nearing completion. AC will begin insurance authorization process for possible admit. Anticipate decision next week.   Raechel Ache, OTR/L  Rehab Admissions Coordinator  [phone] 10/12/2019 11:19 AM

## 2019-10-12 NOTE — Progress Notes (Signed)
Progress Note  Patient Name: Dustin Harrison Date of Encounter: 10/12/2019  Primary Cardiologist: Ida Rogue, MD - pt request  Subjective   No complaints this morning, tolerated placement of PEG tube yesterday with no complications Denies having significant pain this morning Agreed to undergo TEE this morning  Inpatient Medications    Scheduled Meds: . butamben-tetracaine-benzocaine      . carvedilol  6.25 mg Oral BID WC  . cloNIDine  0.1 mg Transdermal Weekly  . cyclobenzaprine  5 mg Oral TID  . feeding supplement (JEVITY 1.5 CAL/FIBER)  240 mL Per Tube 6 X Daily  . [START ON 10/13/2019] feeding supplement (PRO-STAT SUGAR FREE 64)  30 mL Per Tube Daily  . fentaNYL      . finasteride  5 mg Oral Daily  . free water  120 mL Per Tube 6 X Daily  . insulin aspart  0-15 Units Subcutaneous TID WC  . insulin aspart  4 Units Subcutaneous TID WC  . lidocaine  1 patch Transdermal Q24H  . lidocaine      . losartan  12.5 mg Oral Daily  . midazolam      . midazolam      . psyllium  1 packet Oral Daily  . sodium chloride flush      . tamsulosin  0.8 mg Oral Daily  . timolol  1 drop Both Eyes BID   Continuous Infusions: . penicillin g continuous IV infusion 12 Million Units (10/12/19 0036)   PRN Meds: HYDROcodone-acetaminophen, labetalol, morphine injection, ondansetron **OR** ondansetron (ZOFRAN) IV   Vital Signs    Vitals:   10/12/19 1045 10/12/19 1100 10/12/19 1140 10/12/19 1142  BP: (!) 175/110 (!) 173/89 (!) 172/103 (!) 169/93  Pulse: 63 65 69 68  Resp: 18 (!) 25 16   Temp:   97.7 F (36.5 C)   TempSrc:   Oral   SpO2: 97% 97% 96%   Weight:      Height:        Intake/Output Summary (Last 24 hours) at 10/12/2019 1734 Last data filed at 10/12/2019 1215 Gross per 24 hour  Intake 1495.71 ml  Output 1350 ml  Net 145.71 ml   Filed Weights   10/07/19 0338 10/11/19 1508  Weight: 87.1 kg 87.1 kg    Physical Exam   Constitutional:  oriented to person, place, and  time. No distress.  HENT:  Head: Grossly normal Eyes:  no discharge. No scleral icterus.  Neck: No JVD, no carotid bruits  Cardiovascular: Regular rate and rhythm, no murmurs appreciated Pulmonary/Chest: Clear to auscultation bilaterally, scattered wheezes upper airway Abdominal: Soft.  no distension.  no tenderness.  Musculoskeletal: Normal range of motion Neurological:  normal muscle tone. Coordination normal. No atrophy Skin: Skin warm and dry Psychiatric: normal affect, pleasant   Labs    Chemistry Recent Labs  Lab 10/09/19 0033 10/11/19 0445 10/12/19 0509  NA 135 138 142  K 4.3 4.1 4.4  CL 105 107 109  CO2 24 23 25   GLUCOSE 164* 187* 167*  BUN 42* 54* 47*  CREATININE 1.22 1.38* 1.42*  CALCIUM 8.0* 8.0* 7.8*  PROT  --  5.8*  --   ALBUMIN  --  2.5*  --   AST  --  32  --   ALT  --  40  --   ALKPHOS  --  86  --   BILITOT  --  1.1  --   GFRNONAA 54* 46* 45*  GFRAA >60 54* 52*  ANIONGAP 6 8 8      Hematology Recent Labs  Lab 10/10/19 0415 10/11/19 0445 10/12/19 0509  WBC 15.5* 12.7* 13.9*  RBC 5.40 5.32 5.20  HGB 16.7 16.3 16.2  HCT 48.2 48.2 48.0  MCV 89.3 90.6 92.3  MCH 30.9 30.6 31.2  MCHC 34.6 33.8 33.8  RDW 13.3 13.1 13.2  PLT 74* 94* 120*   Radiology    DG Wrist Complete Right  Result Date: 10/08/2019 CLINICAL DATA:  Right wrist pain. EXAM: RIGHT WRIST - COMPLETE 3+ VIEW COMPARISON:  None. FINDINGS: There is no acute displaced fracture or dislocation. There are degenerative changes of the metacarpophalangeal joints. There are degenerative changes of the radiocarpal joint. There are degenerative changes of the scaphoid trapezial joint. IMPRESSION: Negative. Electronically Signed   By: Constance Holster M.D.   On: 10/08/2019 17:11   MR BRAIN WO CONTRAST  Result Date: 10/09/2019 CLINICAL DATA:  Right upper extremity weakness EXAM: MRI HEAD WITHOUT CONTRAST TECHNIQUE: Multiplanar, multiecho pulse sequences of the brain and surrounding structures were  obtained without intravenous contrast. COMPARISON:  None. FINDINGS: There is substantial artifact related to left cochlear implant. Majority of the brain is obscured on diffusion weighted imaging. Susceptibility weighted imaging is nondiagnostic. Ventricles appear stable in size. Probable chronic microvascular ischemic changes. IMPRESSION: Substantial artifact related to cochlear implant. Acute infarction causing reported symptoms cannot be excluded. Electronically Signed   By: Macy Mis M.D.   On: 10/09/2019 11:39    Telemetry    Pt not on tele - Personally Reviewed  Cardiac Studies   echo 10/08/2019: 1. Left ventricular ejection fraction, by estimation, is 25 to 30%. The  left ventricle has severely decreased function. The left ventricle has no  regional wall motion abnormalities. There is moderate left ventricular  hypertrophy. Left ventricular  diastolic parameters are consistent with Grade I diastolic dysfunction  (impaired relaxation). There is severe akinesis of the left ventricular,  mid-apical anteroseptal wall, anterolateral wall, anterior segment, apical  segment and inferoseptal wall. Wall   motion abnormalities are suggestive of stress induced cardiomyopathy vs.  LAD infarct.   2. Right ventricular systolic function is normal. The right ventricular  size is normal. There is mildly elevated pulmonary artery systolic  pressure.   3. Left atrial size was mildly dilated.   4. The mitral valve is normal in structure. No evidence of mitral valve  regurgitation. No evidence of mitral stenosis.   5. The aortic valve is normal in structure. Aortic valve regurgitation is  not visualized. Mild to moderate aortic valve sclerosis/calcification is  present, without any evidence of aortic stenosis.   6. The inferior vena cava is normal in size with greater than 50%  respiratory variability, suggesting right atrial pressure of 3 mmHg.   7. No clear vegetations but suboptimal  study.   Patient Profile     86 y.o. male with history of pheochromocytoma, presenting to the hospital with tremors and chills.  Found to have bacteremia and newly diagnosed systolic dysfunction with EF 25 to 30%.   Assessment & Plan    AS/P: 1. Cardiomyopathy, Ejection fraction 25 to 30% Low ejection fraction confirmed again on TEE today We would recommend outpatient ischemic evaluation following treatment of his bacteremia --Blood pressure markedly elevated like this morning Has not been able to receive his blood pressure medications --- Would restart carvedilol, losartan through PEG tube.  Could potentially transition to [facility] and wean off clonidine patch.  Long-acting nitrates could also be used/hydralazine  2.  Bacteremia Transesophageal echo performed today, no clear vegetation noted  3.  Chronic kidney disease stage III Stable Start losartan 50, consider transitioning to Entresto  Thrombocytopenia Numbers are low but stable   Total encounter time more than 25 minutes  Greater than 50% was spent in counseling and coordination of care with the patient   Signed, Ida Rogue, MD  10/12/2019, 5:34 PM    For questions or updates, please contact   Please consult [email or web address] for contact info under Cardiology/STEMI.

## 2019-10-12 NOTE — Progress Notes (Signed)
OT Cancellation Note  Patient Details Name: Shahab Borre MRN: [id number] DOB: 09-25-1931   Cancelled Treatment:    Reason Eval/Treat Not Completed: Patient at procedure or test/ unavailable  Pt off floor for TEE, will f/u as able for OT tx. Thank you.  Gerrianne Scale, [place], OTR/L ascom [phone] 10/12/19, 9:01 AM

## 2019-10-12 NOTE — Progress Notes (Signed)
Patient's white blood cell count continues to improve and is now 13.7.  Platelets are also trending upward and are now 120.  There is no evidence of DIC.  Patient does not require bone marrow biopsy.  Will follow from a distance.  Appreciate consult, call with questions.

## 2019-10-12 NOTE — Progress Notes (Signed)
Initial Nutrition Assessment  DOCUMENTATION CODES:   Non-severe (moderate) malnutrition in context of chronic illness  INTERVENTION:   Jevity 1.5- 6 cans daily via G-tube- start with 1/2 can 6 times daily and increase as tolerated  Prostat liquid protein 30 ml daily via tube, each supplement provides 100 kcal, 15 grams protein.  Free water flushes 80m before and after each tube feeding   Regimen provides 2230kcal/day, 105g/day and 18046mday free water  Pt likely at low refeed risk   NUTRITION DIAGNOSIS:   Moderate Malnutrition related to chronic illness(DM, CHF, CKD) as evidenced by moderate fat depletion, moderate muscle depletion, severe muscle depletion.  GOAL:   Patient will meet greater than or equal to 90% of their needs  MONITOR:   Labs, Weight trends, Skin, TF tolerance, I & O's  REASON FOR ASSESSMENT:   Consult Enteral/tube feeding initiation and management  ASSESSMENT:   [age]/o male with h/o DM, BPH, CHF, CKD II who was admitted chronic low back and neck pain   Pt s/p surgical G-tube placement (18Fr) and ventral hernia repair 5/6  Met with pt in room today. Pt's main complaint today is his back pain. Pt reports good appetite and oral intake at baseline. Pt reports that he normally eats really well. Pt was eating 100% of meals in hospital but on 5/2 developed dysphagia and was seen by SLP who recommended NPO. Pt seen by ENT on 5/5 and found to have right sided TVC paralysis and associated dysphagia. Pt s/p G-tube placement yesterday. Plan is to initiate tube feeds today. Pt is requesting bolus feeds as he reports that he is independent at home. Pt is likely at low refeed risk.    Per chart, pt appears weight stable pta.   Medications reviewed and include: insulin, psyllium, penicillin  Labs reviewed: K 4.4 wnl, BUN 47(H), creat 1.42(H), Mg 2.6(H) Wbc- 13.9(H) cbgs- 151, 129 x 24 hrs  NUTRITION - FOCUSED PHYSICAL EXAM:    Most Recent Value  Orbital  Region  Mild depletion  Upper Arm Region  Moderate depletion  Thoracic and Lumbar Region  Mild depletion  Buccal Region  Moderate depletion  Temple Region  Mild depletion  Clavicle Bone Region  Severe depletion  Clavicle and Acromion Bone Region  Severe depletion  Scapular Bone Region  Moderate depletion  Dorsal Hand  Mild depletion  Patellar Region  Moderate depletion  Anterior Thigh Region  Mild depletion  Posterior Calf Region  Mild depletion  Edema (RD Assessment)  None  Hair  Reviewed  Eyes  Reviewed  Mouth  Reviewed  Skin  Reviewed  Nails  Reviewed     Diet Order:   Diet Order            Diet NPO time specified  Diet effective now             EDUCATION NEEDS:   Education needs have been addressed  Skin:  Skin Assessment: Reviewed RN Assessment(abdominal incision)  Last BM:  4/29  Height:   Ht Readings from Last 1 Encounters:  10/11/19 '5\' 11"'$  (1.803 m)    Weight:   Wt Readings from Last 1 Encounters:  10/11/19 87.1 kg    Ideal Body Weight:  78 kg  BMI:  Body mass index is 26.78 kg/m.  Estimated Nutritional Needs:   Kcal:  2100-2400kcal/day  Protein:  105-120g/day  Fluid:  >2L/day  CaKoleen DistanceS, RD, LDN Please refer to [facility] and/or RD on-call/weekend/after hours pager

## 2019-10-12 NOTE — TOC Progression Note (Addendum)
Transition of Care [facility]) - Progression Note    Patient Details  Name: Dustin Harrison MRN: [id number] Date of Birth: 28-Jan-1932  [facility]) CM/SW [place], LCSW Phone Number: 10/12/2019, 8:47 AM  Clinical Narrative: Received consult that patient may be ready for discharge over the weekend. Notified [facility] admissions coordinator, Jhonnie Garner. She will start insurance authorization once TEE results are available because [employer] will want to see them. Likely will not have insurance authorization until Monday at the earliest. MD aware. Will send FL2 to [facility] as backup plan in case patient is unable to go to [facility].   3:48 pm Notified Juliann Pulse with [facility] that patient has a new peg tube in the event that patient ends up discharging home so they can supply tube feeds.  Expected Discharge Plan: [place] Barriers to Discharge: Continued Medical Work up  Expected Discharge Plan and Services Expected Discharge Plan: [place] Choice: [place] arrangements for the past 2 months: [place]                                       Social Determinants of Health (SDOH) Interventions    Readmission Risk Interventions No flowsheet data found.

## 2019-10-12 NOTE — Progress Notes (Signed)
PROGRESS NOTE    Dustin Harrison  TKP:[id number] DOB: [birth year] DOA: 10/07/2019 PCP: Jinny Sanders, MD    Brief Narrative:  Dustin Boyte Johnsonis a 86 y.o.malewith medical history significant forhypertension, diabetes mellitus and chronic low back pain who presents to the emergency room for evaluation of worsening low back pain and generalized weakness. Patient had noncontrasted MRI of cervical, thoracic and lumbar spines,showed a significant degenerative disc disease,but no evidence of osteomyelitis. Patient also had positive blood culture with group B streptococcus. Penicillin G is started after consultation with ID. 5/6. Patient continued to have significant dysphagia, patient was seen by ENT, patient had a significant right vocal cord paralysis.  Feeding tube replaced.       Assessment & Plan:   Principal Problem:   AKI (acute kidney injury) ([place]) Active Problems:   Diabetes mellitus without complication ([place])   Essential hypertension, benign   CKD (chronic kidney disease) stage 2, GFR 60-89 ml/min   Back pain   Leukocytosis   Cardiomyopathy ([place])   Bacteremia   HFrEF (heart failure with reduced ejection fraction) ([place])  #1.  Group B streptococcus bacteremia. Source of infection still unclear.  TEE will be performed today. Consider CT abdomen/pelvis with contrast tomorrow after reviewing TEE results. Continue penicillin G. Switch to Rocephin after discharge. PICC line placed today.  #2.  Dysphagia. Secondary to vocal cord paralysis. Tube feeding will start today. Patient be strictly n.p.o.  3.  Chronic systolic congestive heart failure with ejection fraction 25 to 30%. No evidence of rehydration.  I will continue some IV fluids will reduce the dose, will discontinue fluids once patient can tolerate tube feeding.  4.  Thrombocytopenia. Due to infection, much improved.  5.  Acute kidney injury. Continue some IV fluids.  6.  Type 2 diabetes. Continue  sliding-scale insulin  7. Essential hypertension. Continue clonidine patch.  8.  Right-sided weakness. Followed by neurology.    DVT prophylaxis:SCDs Code Status:Full Family Communication:Called the patient wife, updated patient condition. Disposition Plan:  Patient came from:Home  Anticipated d/c place:Home  Barriers to d/c OR conditions which need to be met to effect a safe d/c:   Consultants:  Neurology  Cardiology  Infectious disease  GI  ENT  Hematology  Procedures:None Antimicrobials:Penicillin G.   Subjective: Patient complaining some dry mouth.  No abdominal pain no nausea vomiting. Denies any short of breath or cough. Slept well through the night.  Objective: Vitals:   10/11/19 1830 10/11/19 1852 10/11/19 2150 10/12/19 0325  BP: (!) 170/92 (!) 165/91 (!) 151/83 (!) 180/95  Pulse: 75 72 74 67  Resp: _0 Temp: 99 F (37.2 C) 97.9 F (36.6 C) 98.2 F (36.8 C) 97.9 F (36.6 C)  TempSrc:  Oral Oral Oral  SpO2: 95% 94% 93% 92%  Weight:      Height:        Intake/Output Summary (Last 24 hours) at 10/12/2019 0904 Last data filed at 10/12/2019 0841 Gross per 24 hour  Intake 2095.71 ml  Output 1352 ml  Net 743.71 ml   Filed Weights   10/07/19 0338 10/11/19 1508  Weight: 87.1 kg 87.1 kg    Examination:  General exam: Appears calm and comfortable MM dry. Respiratory system: Clear to auscultation. Respiratory effort normal. Cardiovascular system: S1 & S2 heard, RRR. No JVD, murmurs, rubs, gallops or clicks. No pedal edema. Gastrointestinal system: Abdomen is nondistended, soft and nontender. No organomegaly or masses felt. Normal bowel sounds heard. Central nervous system:  Alert and oriented. No focal neurological deficits. Extremities: right hand weakness Skin: No rashes, lesions or ulcers Psychiatry: Judgement  and insight appear normal. Mood & affect appropriate.     Data Reviewed: I have personally reviewed following labs and imaging studies  CBC: Recent Labs  Lab 10/07/19 0727 10/07/19 0727 10/08/19 0511 10/09/19 0033 10/10/19 0415 10/11/19 0445 10/12/19 0509  WBC 28.1*   < > 23.5* 21.0* 15.5* 12.7* 13.9*  NEUTROABS 24.3*  --   --   --   --  10.3* 11.4*  HGB 14.4   < > 15.1 15.1 16.7 16.3 16.2  HCT 43.4   < > 44.9 44.7 48.2 48.2 48.0  MCV 93.3   < > 91.6 90.9 89.3 90.6 92.3  PLT 121*   < > 99* 80* 74* 94* 120*   < > = values in this interval not displayed.   Basic Metabolic Panel: Recent Labs  Lab 10/07/19 0727 10/08/19 0511 10/09/19 0033 10/11/19 0445 10/12/19 0509  NA 134* 135 135 138 142  K 5.0 4.2 4.3 4.1 4.4  CL 102 104 105 107 109  CO2 _0 GLUCOSE 140* 148* 164* 187* 167*  BUN 66* 48* 42* 54* 47*  CREATININE 2.44* 1.61* 1.22 1.38* 1.42*  CALCIUM 8.6* 8.3* 8.0* 8.0* 7.8*  MG  --   --   --  2.6* 2.6*   GFR: Estimated Creatinine Clearance: 40.5 mL/min (A) (by C-G formula based on SCr of 1.42 mg/dL (H)). Liver Function Tests: Recent Labs  Lab 10/11/19 0445  AST 32  ALT 40  ALKPHOS 86  BILITOT 1.1  PROT 5.8*  ALBUMIN 2.5*   No results for input(s): LIPASE, AMYLASE in the last 168 hours. No results for input(s): AMMONIA in the last 168 hours. Coagulation Profile: Recent Labs  Lab 10/11/19 0445  INR 1.1   Cardiac Enzymes: Recent Labs  Lab 10/07/19 0727  CKTOTAL 305   BNP (last 3 results) No results for input(s): PROBNP in the last 8760 hours. HbA1C: No results for input(s): HGBA1C in the last 72 hours. CBG: Recent Labs  Lab 10/11/19 1152 10/11/19 1516 10/11/19 1758 10/11/19 2145 10/12/19 0749  GLUCAP 177* 208* 175* 150* 151*   Lipid Profile: No results for input(s): CHOL, HDL, LDLCALC, TRIG, CHOLHDL, LDLDIRECT in the last 72 hours. Thyroid Function Tests: No results for input(s): TSH, T4TOTAL, FREET4, T3FREE, THYROIDAB in  the last 72 hours. Anemia Panel: No results for input(s): VITAMINB12, FOLATE, FERRITIN, TIBC, IRON, RETICCTPCT in the last 72 hours. Sepsis Labs: Recent Labs  Lab 10/07/19 0831  LATICACIDVEN 1.7    Recent Results (from the past 240 hour(s))  Urine culture     Status: None   Collection Time: 10/07/19  7:27 AM   Specimen: Urine, Random  Result Value Ref Range Status   Specimen Description   Final    URINE, RANDOM Performed at [facility], [address], [place], [place] [address]    Special Requests   Final    NONE Performed at [facility], [address], [place], [place] [address]    Culture   Final    NO GROWTH Performed at [facility], [place] [address], [place], [place] [address]    Report Status 10/08/2019 FINAL  Final  Blood Culture (routine x 2)     Status: Abnormal   Collection Time: 10/07/19  8:31 AM   Specimen: BLOOD  Result Value Ref Range Status   Specimen Description  Final    BLOOD LAC Performed at [facility], [address], [place], [place] [address]    Special Requests   Final    BOTTLES DRAWN AEROBIC AND ANAEROBIC Blood Culture adequate volume Performed at [facility], [address] [place], [place] [address]    Culture  Setup Time   Final    GRAM POSITIVE COCCI IN BOTH AEROBIC AND ANAEROBIC BOTTLES CRITICAL RESULT CALLED TO, READ BACK BY AND VERIFIED WITH: ASAJAH DUNCAN ON 10/07/2019 AT 2035 TIK Performed at [facility], [address] [place], [place] [address]    Culture (A)  Final    GROUP B STREP(S.AGALACTIAE)ISOLATED SUSCEPTIBILITIES PERFORMED ON PREVIOUS CULTURE WITHIN THE LAST 5 DAYS. Performed at [facility], [place] [address], [place], [place] [address]    Report Status 10/09/2019 FINAL  Final  Blood Culture (routine x 2)     Status: Abnormal   Collection Time: 10/07/19  8:31 AM   Specimen: BLOOD  Result Value Ref Range Status   Specimen Description   Final    BLOOD  LWRIST Performed at [facility], [address], [place], [place] [address]    Special Requests   Final    BOTTLES DRAWN AEROBIC AND ANAEROBIC Blood Culture adequate volume Performed at [facility], [address] [place],  [address]    Culture  Setup Time   Final    GRAM POSITIVE COCCI IN BOTH AEROBIC AND ANAEROBIC BOTTLES CRITICAL RESULT CALLED TO, READ BACK BY AND VERIFIED WITH: ASAJAH DUNCAN ON 10/07/2019 AT 2035 TIK Performed at [facility], [place] [address], [place],  [address]    Culture GROUP B STREP(S.AGALACTIAE)ISOLATED (A)  Final   Report Status 10/09/2019 FINAL  Final   Organism ID, Bacteria GROUP B STREP(S.AGALACTIAE)ISOLATED  Final      Susceptibility   Group b strep(s.agalactiae)isolated - MIC*    CLINDAMYCIN <=0.25 SENSITIVE Sensitive     AMPICILLIN <=0.25 SENSITIVE Sensitive     ERYTHROMYCIN <=0.12 SENSITIVE Sensitive     VANCOMYCIN 0.5 SENSITIVE Sensitive     CEFTRIAXONE <=0.12 SENSITIVE Sensitive     LEVOFLOXACIN 1 SENSITIVE Sensitive     PENICILLIN Value in next row Sensitive      SENSITIVE0.06    * GROUP B STREP(S.AGALACTIAE)ISOLATED  Blood Culture ID Panel (Reflexed)     Status: Abnormal   Collection Time: 10/07/19  8:31 AM  Result Value Ref Range Status   Enterococcus species NOT DETECTED NOT DETECTED Final   Listeria monocytogenes NOT DETECTED NOT DETECTED Final   Staphylococcus species NOT DETECTED NOT DETECTED Final   Staphylococcus aureus (BCID) NOT DETECTED NOT DETECTED Final   Streptococcus species DETECTED (A) NOT DETECTED Final    Comment: CRITICAL RESULT CALLED TO, READ BACK BY AND VERIFIED WITH: ASAJAH DUNCAN ON 10/07/2019 AT 2035 TIK    Streptococcus agalactiae DETECTED (A) NOT DETECTED Final    Comment: CRITICAL RESULT CALLED TO, READ BACK BY AND VERIFIED WITH: ASAJAH DUNCAN ON 10/07/2019 AT 2035 TIK    Streptococcus pneumoniae NOT DETECTED NOT DETECTED Final   Streptococcus pyogenes NOT DETECTED  NOT DETECTED Final   Acinetobacter baumannii NOT DETECTED NOT DETECTED Final   Enterobacteriaceae species NOT DETECTED NOT DETECTED Final   Enterobacter cloacae complex NOT DETECTED NOT DETECTED Final   Escherichia coli NOT DETECTED NOT DETECTED Final   Klebsiella oxytoca NOT DETECTED NOT DETECTED Final   Klebsiella pneumoniae NOT DETECTED NOT DETECTED Final   Proteus species NOT  DETECTED NOT DETECTED Final   Serratia marcescens NOT DETECTED NOT DETECTED Final   Haemophilus influenzae NOT DETECTED NOT DETECTED Final   Neisseria meningitidis NOT DETECTED NOT DETECTED Final   Pseudomonas aeruginosa NOT DETECTED NOT DETECTED Final   Candida albicans NOT DETECTED NOT DETECTED Final   Candida glabrata NOT DETECTED NOT DETECTED Final   Candida krusei NOT DETECTED NOT DETECTED Final   Candida parapsilosis NOT DETECTED NOT DETECTED Final   Candida tropicalis NOT DETECTED NOT DETECTED Final    Comment: Performed at [facility], [address], [place], [place] [address]  Respiratory Panel by RT PCR (Flu A&B, Covid) - Nasopharyngeal Swab     Status: None   Collection Time: 10/07/19 12:27 PM   Specimen: Nasopharyngeal Swab  Result Value Ref Range Status   SARS Coronavirus 2 by RT PCR NEGATIVE NEGATIVE Final    Comment: (NOTE) SARS-CoV-2 target nucleic acids are NOT DETECTED. The SARS-CoV-2 RNA is generally detectable in upper respiratoy specimens during the acute phase of infection. The lowest concentration of SARS-CoV-2 viral copies this assay can detect is 131 copies/mL. A negative result does not preclude SARS-Cov-2 infection and should not be used as the sole basis for treatment or other patient management decisions. A negative result may occur with  improper specimen collection/handling, submission of specimen other than nasopharyngeal swab, presence of viral mutation(s) within the areas targeted by this assay, and inadequate number of viral copies (<131 copies/mL). A negative  result must be combined with clinical observations, patient history, and epidemiological information. The expected result is Negative. Fact Sheet for Patients:  PinkCheek.be Fact Sheet for Healthcare Providers:  GravelBags.it This test is not yet ap proved or cleared by the [place] FDA and  has been authorized for detection and/or diagnosis of SARS-CoV-2 by FDA under an Emergency Use Authorization (EUA). This EUA will remain  in effect (meaning this test can be used) for the duration of the COVID-19 declaration under Section 564(b)(1) of the Act, 21 U.S.C. section 360bbb-3(b)(1), unless the authorization is terminated or revoked sooner.    Influenza A by PCR NEGATIVE NEGATIVE Final   Influenza B by PCR NEGATIVE NEGATIVE Final    Comment: (NOTE) The Xpert Xpress SARS-CoV-2/FLU/RSV assay is intended as an aid in  the diagnosis of influenza from Nasopharyngeal swab specimens and  should not be used as a sole basis for treatment. Nasal washings and  aspirates are unacceptable for Xpert Xpress SARS-CoV-2/FLU/RSV  testing. Fact Sheet for Patients: PinkCheek.be Fact Sheet for Healthcare Providers: GravelBags.it This test is not yet approved or cleared by the [place] FDA and  has been authorized for detection and/or diagnosis of SARS-CoV-2 by  FDA under an Emergency Use Authorization (EUA). This EUA will remain  in effect (meaning this test can be used) for the duration of the  Covid-19 declaration under Section 564(b)(1) of the Act, 21  U.S.C. section 360bbb-3(b)(1), unless the authorization is  terminated or revoked. Performed at [facility], [address] [place], [place] [address]   Culture, blood (Routine X 2) w Reflex to ID Panel     Status: None (Preliminary result)   Collection Time: 10/09/19 12:31 AM   Specimen: BLOOD LEFT WRIST  Result  Value Ref Range Status   Specimen Description BLOOD LEFT WRIST  Final   Special Requests   Final    BOTTLES DRAWN AEROBIC AND ANAEROBIC Blood Culture adequate volume   Culture   Final    NO GROWTH 3 DAYS  Performed at [facility], [address] [place], [place] [address]    Report Status PENDING  Incomplete  Culture, blood (Routine X 2) w Reflex to ID Panel     Status: None (Preliminary result)   Collection Time: 10/09/19 12:33 AM   Specimen: BLOOD LEFT HAND  Result Value Ref Range Status   Specimen Description BLOOD LEFT HAND  Final   Special Requests   Final    BOTTLES DRAWN AEROBIC AND ANAEROBIC Blood Culture adequate volume   Culture   Final    NO GROWTH 3 DAYS Performed at [facility], [address], [place], [place] [address]    Report Status PENDING  Incomplete         Radiology Studies: [place] EKG SITE RITE  Result Date: 10/12/2019 If Site Rite image not attached, placement could not be confirmed due to current cardiac rhythm.       Scheduled Meds: . butamben-tetracaine-benzocaine      . carvedilol  6.25 mg Oral BID WC  . cloNIDine  0.1 mg Transdermal Weekly  . cyclobenzaprine  5 mg Oral TID  . fentaNYL      . finasteride  5 mg Oral Daily  . insulin aspart  0-15 Units Subcutaneous TID WC  . insulin aspart  4 Units Subcutaneous TID WC  . lidocaine  1 patch Transdermal Q24H  . lidocaine      . losartan  12.5 mg Oral Daily  . midazolam      . psyllium  1 packet Oral Daily  . sodium chloride flush      . tamsulosin  0.8 mg Oral Daily  . timolol  1 drop Both Eyes BID   Continuous Infusions: . sodium chloride    . sodium chloride    . dextrose 5 % and 0.45 % NaCl with KCl 20 mEq/L 75 mL/hr at 10/11/19 2158  . penicillin g continuous IV infusion 12 Million Units (10/12/19 0036)     LOS: 5 days    Time spent: 28 minutes    Sharen Hones, MD [facility]   To contact the attending provider between 7A-7P or the covering  provider during after hours 7P-7A, please log into the web site [email or web address] and access using universal [place] password for that web site. If you do not have the password, please call the hospital operator.  10/12/2019, 9:04 AM

## 2019-10-12 NOTE — Progress Notes (Signed)
*  PRELIMINARY RESULTS* Echocardiogram Echocardiogram Transesophageal has been performed.  Sherrie Sport 10/12/2019, 10:06 AM

## 2019-10-12 NOTE — Progress Notes (Signed)
PT Cancellation Note  Patient Details Name: Kuldeep Cotes MRN: [id number] DOB: [birth year]   Cancelled Treatment:    Reason Eval/Treat Not Completed: (Treatment session attempted.  Patient currently off unit for TEE.  Will re-attempt at later time/date as medically appropriate and available.)   Dylin Ihnen H. Owens Shark, PT, DPT, NCS 10/12/19, 8:54 AM [phone]

## 2019-10-12 NOTE — Progress Notes (Signed)
Transesophageal Echocardiogram :  Indication: Bacteremia, endocarditis Requesting/ordering  physician:   Procedure: Benzocaine spray x2 and 2 mls x 2 of viscous lidocaine were given orally to provide local anesthesia to the oropharynx. The patient was positioned supine on the left side, bite block provided. The patient was moderately sedated with the doses of versed and fentanyl as detailed below.  Using digital technique an omniplane probe was advanced into the distal esophagus without incident.   Moderate sedation: 1. Sedation used:  Versed: 3 mg, Fentanyl: 75 mcg 2. Time administered: 9:25 AM     time when patient started recovery: 10 AM Total sedation time 35 minutes 3. I was face to face during this time  See report in EPIC  for complete details: In brief, very challenging procedure secondary to suspected esophageal stricture making it difficult to maneuver the probe.  Grossly, no valve vegetation noted  Aortic valve is heavily sclerotic, restriction of leaflets mobility, suspected moderate aortic valve stenosis.  No vegetation noted   Transgastric imaging revealed severely reduced LV function.  Unable to exclude RWMAs, no mural apical thrombus.  .  Estimated ejection fraction was 25-30%.  Right sided cardiac chambers were normal with no evidence of pulmonary hypertension.  Interatrial septum not well visualized Bubble study was not performed  The LA was well visualized in orthogonal views.  There was no spontaneous contrast and no thrombus in the LA and LA appendage Unable to visualize left atrial appendage well to exclude thrombus  The descending thoracic aorta was not well visualized  Significant coughing and secretions throughout the case requiring frequent suctioning Laryngeal airway obstruction noted likely from paralyzed vocal cord, airway improved with jaw thrust   Ida Rogue 10/12/2019 10:07 AM

## 2019-10-12 NOTE — Progress Notes (Signed)
[place] Hospital Day(s): 5.   Post op day(s): 1 Day Post-Op.   Interval History:  Patient seen and examined no acute events or new complaints overnight.  Patient without any issues No issues with g-tube  Vital signs in last 24 hours: [min-max] current  Temp:  [97.9 F (36.6 C)-99 F (37.2 C)] 97.9 F (36.6 C) (05/07 0325) Pulse Rate:  [67-79] 67 (05/07 0325) Resp:  [16-28] 20 (05/07 0325) BP: (151-182)/(83-111) 180/95 (05/07 0325) SpO2:  [92 %-99 %] 92 % (05/07 0325) Weight:  [87.1 kg] 87.1 kg (05/06 1508)     Height: 5\' 11"  (180.3 cm) Weight: 87.1 kg BMI (Calculated): 26.79   Intake/Output last 2 shifts:  05/06 0701 - 05/07 0700 In: 2095.7 [I.V.:1061.7; IV Piggyback:1034] Out: [id number] [Urine:1050; Blood:2]   Physical Exam:  Constitutional: alert, cooperative and no distress  Respiratory: breathing non-labored at rest  Cardiovascular: regular rate and sinus rhythm  Gastrointestinal: soft, non-tender, and non-distended, G-Tube in the LUQ, draining to gravity, gastric fluid Integumentary: Laparoscopic incisions are CDI with dermabond  Labs:  CBC Latest Ref Rng & Units 10/12/2019 10/11/2019 10/10/2019  WBC 4.0 - 10.5 K/uL 13.9(H) 12.7(H) 15.5(H)  Hemoglobin 13.0 - 17.0 g/dL 16.2 16.3 16.7  Hematocrit 39.0 - 52.0 % 48.0 48.2 48.2  Platelets 150 - 400 K/uL 120(L) 94(L) 74(L)   CMP Latest Ref Rng & Units 10/12/2019 10/11/2019 10/09/2019  Glucose 70 - 99 mg/dL 167(H) 187(H) 164(H)  BUN 8 - 23 mg/dL 47(H) 54(H) 42(H)  Creatinine 0.61 - 1.24 mg/dL 1.42(H) 1.38(H) 1.22  Sodium 135 - 145 mmol/L 142 138 135  Potassium 3.5 - 5.1 mmol/L 4.4 4.1 4.3  Chloride 98 - 111 mmol/L 109 107 105  CO2 22 - 32 mmol/L 25 23 24   Calcium 8.9 - 10.3 mg/dL 7.8(L) 8.0(L) 8.0(L)  Total Protein 6.5 - 8.1 g/dL - 5.8(L) -  Total Bilirubin 0.3 - 1.2 mg/dL - 1.1 -  Alkaline Phos 38 - 126 U/L - 86 -  AST 15 - 41 U/L - 32 -  ALT 0 - 44 U/L - 40 -    Imaging studies:  No new pertinent imaging studies   Assessment/Plan:  86 y.o. male 1 Day Post-Op s/p robotic assisted laparoscopic placement of gastrostomy tube for malnutrition, dysphagia, and vocal cord paralysis.   - Okay to access gastrostomy tube today   - No further surgical needs   - General surgery will sign off   All of the above findings and recommendations were discussed with the patient, and the medical team, and all of patient's questions were answered to his expressed satisfaction.  -- Edison Simon, PA-C [facility] 10/12/2019, 9:04 AM [phone] M-F: 7am - 4pm

## 2019-10-12 NOTE — NC FL2 (Signed)
[place] LEVEL OF CARE SCREENING TOOL     IDENTIFICATION  Patient Name: Dustin Harrison Birthdate: [birth year] Sex: male Admission Date (Current Location): 10/07/2019  [place] and [place] Number:  Engineering geologist and Address:  [facility], [address], [place], [place] [address]      Provider Number: [id number]  Attending Physician Name and Address:  Sharen Hones, MD  Relative Name and Phone Number:       Current Level of Care: Hospital Recommended Level of Care: [place] Prior Approval Number:    Date Approved/Denied:   PASRR Number: [id number]  Discharge Plan: SNF    Current Diagnoses: Patient Active Problem List   Diagnosis Date Noted  . HFrEF (heart failure with reduced ejection fraction) ([place])   . Cardiomyopathy ([place])   . Bacteremia   . AKI (acute kidney injury) ([place]) 10/07/2019  . Back pain 10/07/2019  . Leukocytosis 10/07/2019  . Acute neck pain 05/25/2019  . CKD (chronic kidney disease) stage 2, GFR 60-89 ml/min 04/23/2016  . Hearing loss 04/23/2016  . Counseling regarding end of life decision making 04/15/2015  . Essential hypertension, benign 08/22/2012  . Diabetes mellitus without complication ([place]) [id number]  . HYPERCHOLESTEROLEMIA 05/12/2009  . History of pheochromocytoma 09/07/2007  . GLAUCOMA 09/07/2007  . History of duodenal ulcer 09/07/2007  . BPH (benign prostatic hyperplasia) 09/07/2007  . OSTEOARTHRITIS 09/07/2007    Orientation RESPIRATION BLADDER Height & Weight     Self, Situation, Time, Place  Normal Continent Weight: 192 lb 0.3 oz (87.1 kg) Height:  5\' 11"  (180.3 cm)  BEHAVIORAL SYMPTOMS/MOOD NEUROLOGICAL BOWEL NUTRITION STATUS  (None) (None) Continent Feeding tube(PEG placed 5/6.)  AMBULATORY STATUS COMMUNICATION OF NEEDS Skin   Limited Assist Verbally Surgical wounds                       Personal Care Assistance Level of Assistance  Bathing, Feeding,  Dressing Bathing Assistance: Maximum assistance Feeding assistance: (Peg tube) Dressing Assistance: Maximum assistance     Functional Limitations Info  Sight, Hearing, Speech Sight Info: Adequate Hearing Info: Adequate Speech Info: Adequate    SPECIAL CARE FACTORS FREQUENCY  PT (By licensed PT), OT (By licensed OT), Speech therapy     PT Frequency: 5 x week OT Frequency: 5 x week     Speech Therapy Frequency: 5 x week      Contractures Contractures Info: Not present    Additional Factors Info  Code Status, Allergies Code Status Info: Full code Allergies Info: NKDA           Current Medications (10/12/2019):  This is the current hospital active medication list Current Facility-Administered Medications  Medication Dose Route Frequency Provider Last Rate Last Admin  . 0.9 %  sodium chloride infusion   Intravenous Continuous Jules Husbands, MD   New Bag at 10/11/19 1547  . butamben-tetracaine-benzocaine (CETACAINE) 07-09-12 % spray           . carvedilol (COREG) tablet 6.25 mg  6.25 mg Oral BID WC Pabon, Diego F, MD      . cloNIDine (CATAPRES - Dosed in mg/24 hr) patch 0.1 mg  0.1 mg Transdermal Weekly Pabon, Diego F, MD   0.1 mg at 10/11/19 1446  . cyclobenzaprine (FLEXERIL) tablet 5 mg  5 mg Oral TID Caroleen Hamman F, MD   5 mg at 10/08/19 1021  . dextrose 5 % and 0.45 % NaCl with KCl 20 mEq/L infusion  Intravenous Continuous Jules Husbands, MD 75 mL/hr at 10/11/19 2158 New Bag at 10/11/19 2158  . fentaNYL (SUBLIMAZE) 100 MCG/2ML injection           . finasteride (PROSCAR) tablet 5 mg  5 mg Oral Daily Jules Husbands, MD   Stopped at 10/10/19 1022  . HYDROcodone-acetaminophen (NORCO/VICODIN) 5-325 MG per tablet 1 tablet  1 tablet Oral Q6H PRN Jules Husbands, MD   1 tablet at 10/10/19 1248  . insulin aspart (novoLOG) injection 0-15 Units  0-15 Units Subcutaneous TID WC Jules Husbands, MD   3 Units at 10/12/19 0841  . insulin aspart (novoLOG) injection 4 Units  4 Units  Subcutaneous TID WC Jules Husbands, MD   4 Units at 10/10/19 1520  . labetalol (NORMODYNE) injection 10 mg  10 mg Intravenous Q6H PRN Caroleen Hamman F, MD   10 mg at 10/12/19 0415  . lidocaine (LIDODERM) 5 % 1 patch  1 patch Transdermal Q24H Jules Husbands, MD   1 patch at 10/11/19 1443  . lidocaine (XYLOCAINE) 2 % viscous mouth solution           . losartan (COZAAR) tablet 12.5 mg  12.5 mg Oral Daily Jules Husbands, MD   Stopped at 10/10/19 1022  . midazolam (VERSED) 2 MG/2ML injection           . morphine 2 MG/ML injection 2 mg  2 mg Intravenous Q4H PRN Caroleen Hamman F, MD   2 mg at 10/11/19 2156  . ondansetron (ZOFRAN) tablet 4 mg  4 mg Oral Q6H PRN Pabon, Diego F, MD       Or  . ondansetron (ZOFRAN) injection 4 mg  4 mg Intravenous Q6H PRN Pabon, Diego F, MD      . penicillin G potassium 12 Million Units in dextrose 5 % 500 mL continuous infusion  12 Million Units Intravenous Q12H Caroleen Hamman F, MD 41.7 mL/hr at 10/12/19 0036 12 Million Units at 10/12/19 0036  . psyllium (HYDROCIL/METAMUCIL) packet 1 packet  1 packet Oral Daily Jules Husbands, MD   Stopped at 10/08/19 1030  . sodium chloride flush 0.9 % injection           . tamsulosin (FLOMAX) capsule 0.8 mg  0.8 mg Oral Daily Jules Husbands, MD   Stopped at 10/10/19 1022  . timolol (TIMOPTIC) 0.5 % ophthalmic solution 1 drop  1 drop Both Eyes BID Jules Husbands, MD   1 drop at 10/12/19 0841     Discharge Medications: Please see discharge summary for a list of discharge medications.  Relevant Imaging Results:  Relevant Lab Results:   Additional Information SS#: [id number]  Candie Chroman, LCSW

## 2019-10-12 NOTE — Care Management Important Message (Signed)
Important Message  Patient Details  Name: Alon Manzano MRN: [id number] Date of Birth: [birth year]   Medicare Important Message Given:  Yes     Dannette Barbara 10/12/2019, 1:30 PM

## 2019-10-12 NOTE — Anesthesia Postprocedure Evaluation (Signed)
Anesthesia Post Note  Patient: Dustin Harrison  Procedure(s) Performed: XI ROBOTIC ASSISTED LAPAROSCOPIC INSERTION GASTROSTOMY TUBE (N/A Abdomen)  Patient location during evaluation: PACU Anesthesia Type: General Level of consciousness: awake and alert and oriented Pain management: pain level controlled Vital Signs Assessment: post-procedure vital signs reviewed and stable Respiratory status: spontaneous breathing Cardiovascular status: blood pressure returned to baseline Anesthetic complications: no     Last Vitals:  Vitals:   10/11/19 2150 10/12/19 0325  BP: (!) 151/83 (!) 180/95  Pulse: 74 67  Resp: 18 20  Temp: 36.8 C 36.6 C  SpO2: 93% 92%    Last Pain:  Vitals:   10/12/19 0325  TempSrc: Oral  PainSc:                  Lalitha Ilyas

## 2019-10-13 ENCOUNTER — Encounter: Payer: Self-pay | Admitting: Internal Medicine

## 2019-10-13 ENCOUNTER — Inpatient Hospital Stay: Payer: Medicare PPO

## 2019-10-13 DIAGNOSIS — I5022 Chronic systolic (congestive) heart failure: Secondary | ICD-10-CM

## 2019-10-13 LAB — CBC WITH DIFFERENTIAL/PLATELET
Abs Immature Granulocytes: 0.31 10*3/uL — ABNORMAL HIGH (ref 0.00–0.07)
Basophils Absolute: 0 10*3/uL (ref 0.0–0.1)
Basophils Relative: 0 %
Eosinophils Absolute: 0 10*3/uL (ref 0.0–0.5)
Eosinophils Relative: 0 %
HCT: 44.8 % (ref 39.0–52.0)
Hemoglobin: 15.5 g/dL (ref 13.0–17.0)
Immature Granulocytes: 2 %
Lymphocytes Relative: 5 %
Lymphs Abs: 0.8 10*3/uL (ref 0.7–4.0)
MCH: 31.1 pg (ref 26.0–34.0)
MCHC: 34.6 g/dL (ref 30.0–36.0)
MCV: 90 fL (ref 80.0–100.0)
Monocytes Absolute: 1.4 10*3/uL — ABNORMAL HIGH (ref 0.1–1.0)
Monocytes Relative: 8 %
Neutro Abs: 13.8 10*3/uL — ABNORMAL HIGH (ref 1.7–7.7)
Neutrophils Relative %: 85 %
Platelets: 159 10*3/uL (ref 150–400)
RBC: 4.98 MIL/uL (ref 4.22–5.81)
RDW: 13.5 % (ref 11.5–15.5)
WBC: 16.4 10*3/uL — ABNORMAL HIGH (ref 4.0–10.5)
nRBC: 0 % (ref 0.0–0.2)

## 2019-10-13 LAB — BASIC METABOLIC PANEL
Anion gap: 7 (ref 5–15)
BUN: 38 mg/dL — ABNORMAL HIGH (ref 8–23)
CO2: 26 mmol/L (ref 22–32)
Calcium: 7.9 mg/dL — ABNORMAL LOW (ref 8.9–10.3)
Chloride: 111 mmol/L (ref 98–111)
Creatinine, Ser: 1.21 mg/dL (ref 0.61–1.24)
GFR calc Af Amer: >60 mL/min (ref >60)
GFR calc non Af Amer: 54 mL/min — ABNORMAL LOW (ref >60)
Glucose, Bld: 149 mg/dL — ABNORMAL HIGH (ref 70–99)
Potassium: 4.3 mmol/L (ref 3.5–5.1)
Sodium: 144 mmol/L (ref 135–145)

## 2019-10-13 LAB — GLUCOSE, CAPILLARY
Glucose-Capillary: 153 mg/dL — ABNORMAL HIGH (ref 70–99)
Glucose-Capillary: 112 mg/dL — ABNORMAL HIGH (ref 70–99)
Glucose-Capillary: 169 mg/dL — ABNORMAL HIGH (ref 70–99)
Glucose-Capillary: 123 mg/dL — ABNORMAL HIGH (ref 70–99)

## 2019-10-13 LAB — PHOSPHORUS: Phosphorus: 3.2 mg/dL (ref 2.5–4.6)

## 2019-10-13 LAB — MAGNESIUM: Magnesium: 2.6 mg/dL — ABNORMAL HIGH (ref 1.7–2.4)

## 2019-10-13 IMAGING — CT CT ABD-PELV W/ CM
2 of 5 series · 15 of 46 positions shown, 17 images · IV contrast (APPLIED)
Comparison: None.

CLINICAL DATA: Hypertension, diabetes , chronic low back pain
worsening, weakness, bacteremia

EXAM:
CT ABDOMEN AND PELVIS WITH CONTRAST
TECHNIQUE: Multidetector CT imaging of the abdomen and pelvis was performed
using the standard protocol following bolus administration of
intravenous contrast.
CONTRAST:  100mL OMNIPAQUE IOHEXOL 300 MG/ML  SOLN

[Series 2: axial st · axial · 0.86mm/px · z∈[-870,-440]mm · 12 of 98 slices shown, 14 images]
[im 6/98  soft-tissue]
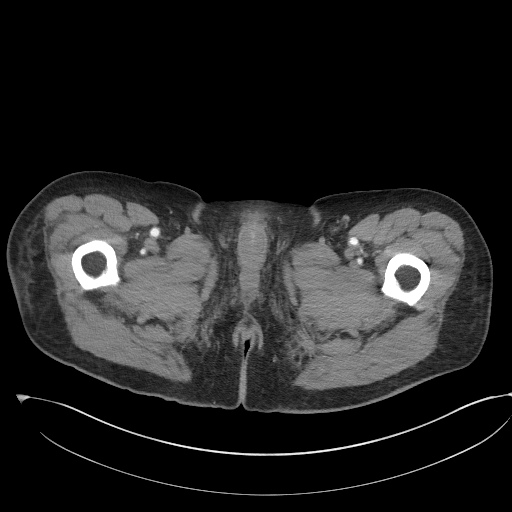
[im 6/98  bone]
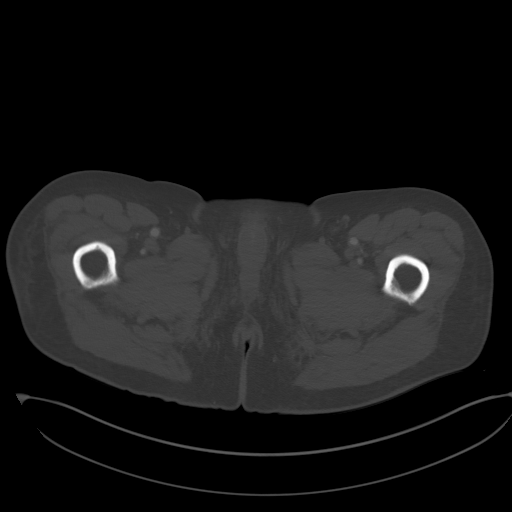
[im 16/98  soft-tissue]
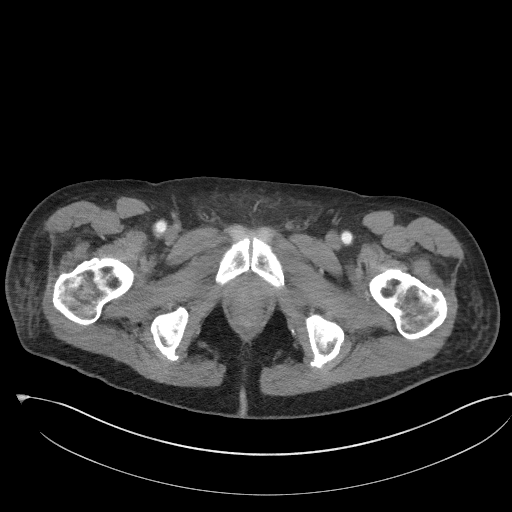
[im 21/98  soft-tissue]
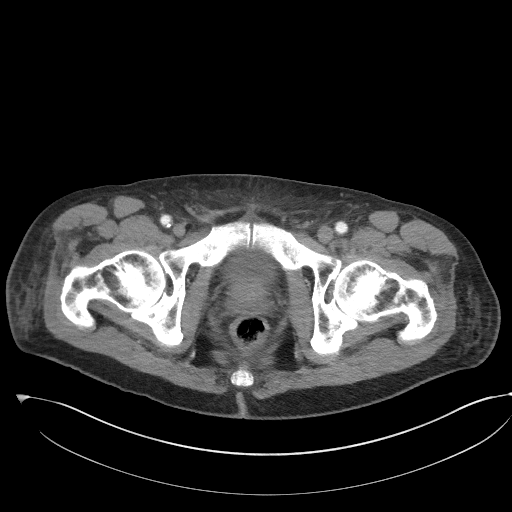
[im 31/98  soft-tissue]
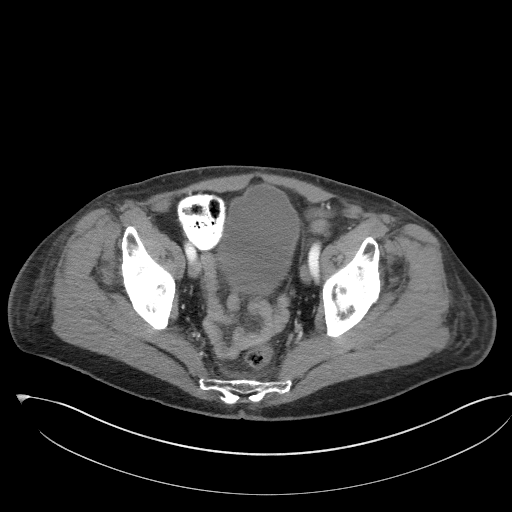
[im 36/98  soft-tissue]
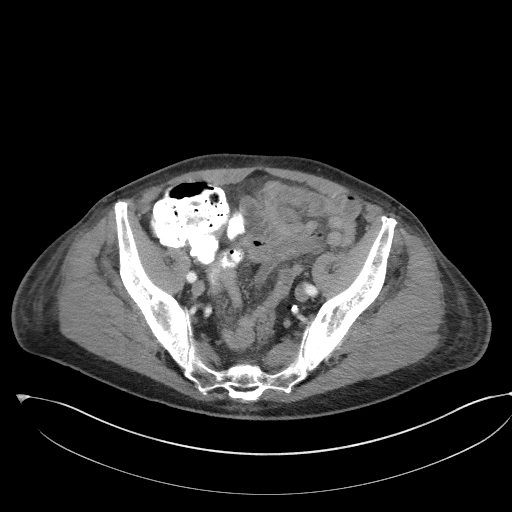
[im 46/98  soft-tissue]
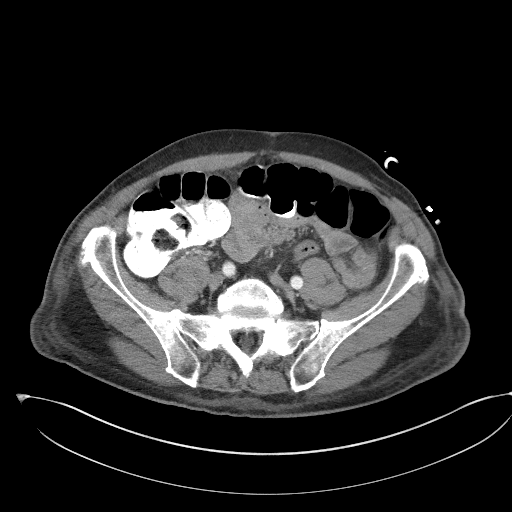
[im 52/98  soft-tissue]
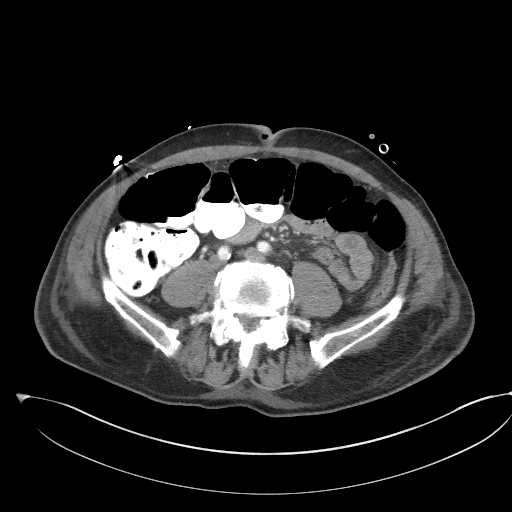
[im 62/98  soft-tissue]
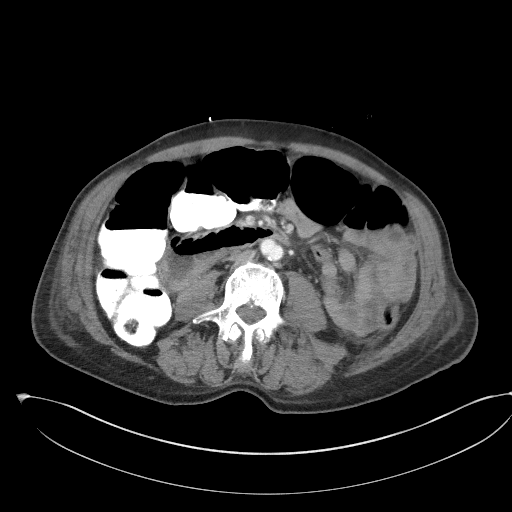
[im 67/98  soft-tissue]
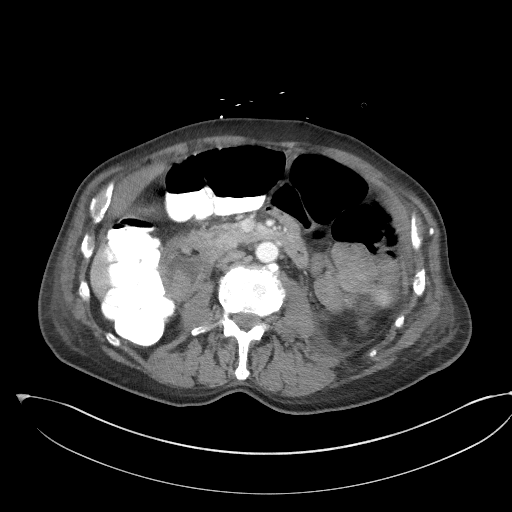
[im 67/98  bone]
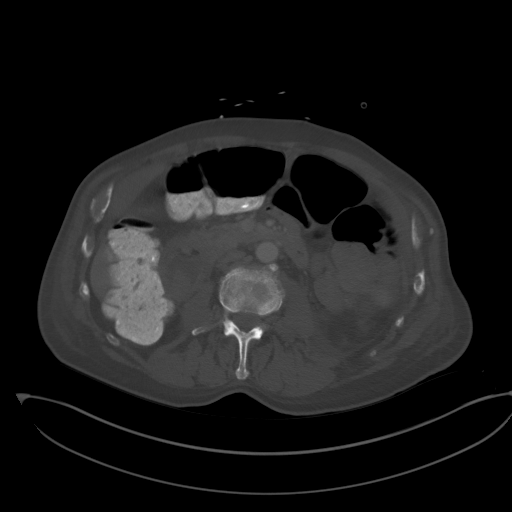
[im 77/98  soft-tissue]
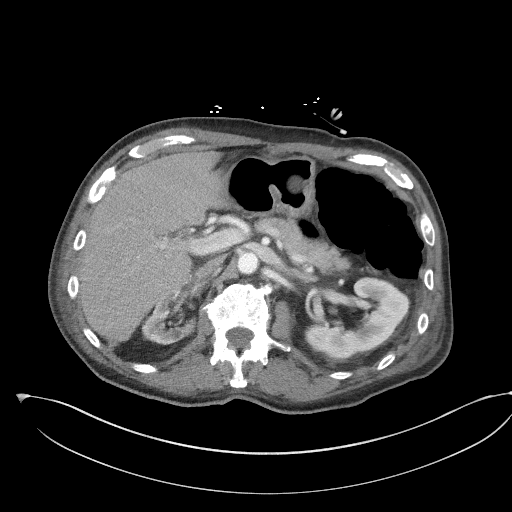
[im 82/98  soft-tissue]
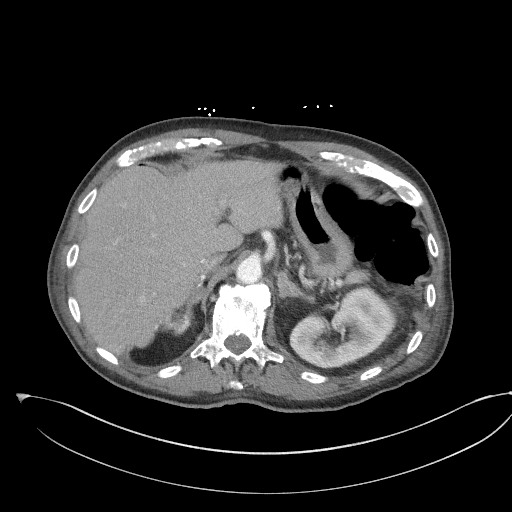
[im 92/98  soft-tissue]
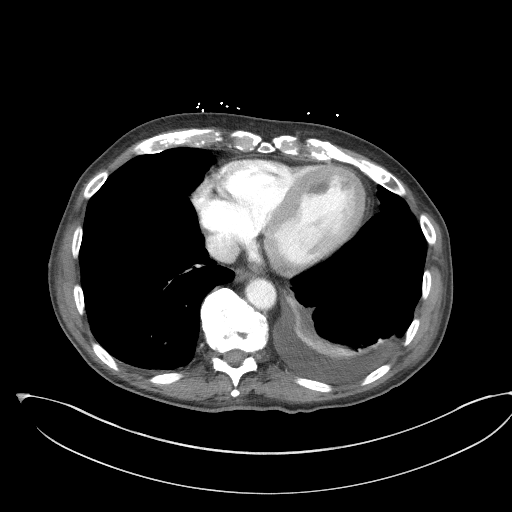

[Series 6: coronal st · coronal · 0.76mm/px · 3 of 93 slices shown]
[im 31/93  soft-tissue]
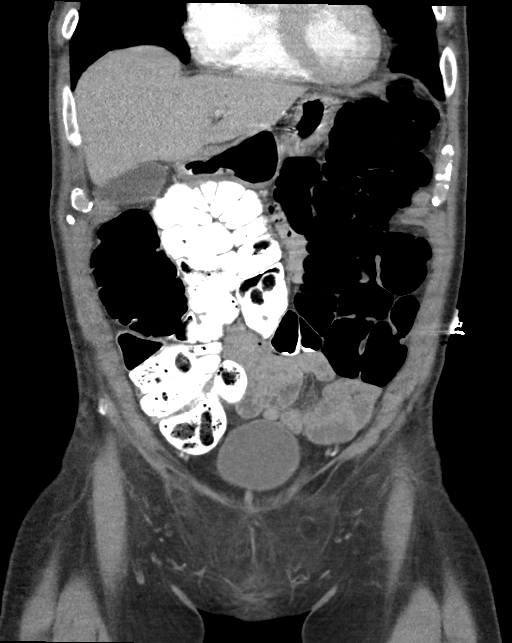
[im 41/93  soft-tissue]
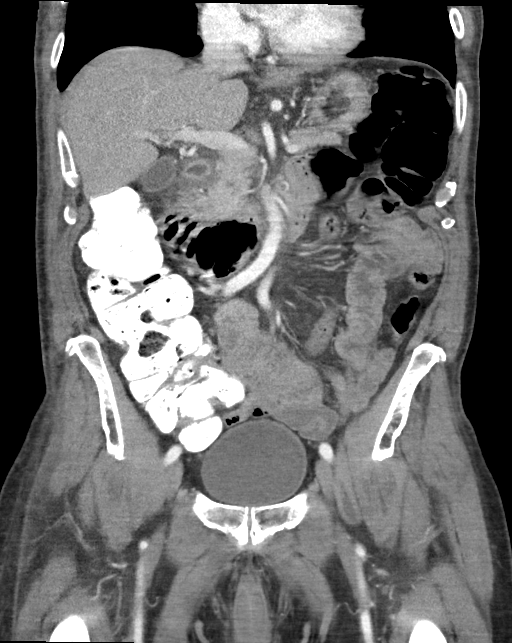
[im 52/93  soft-tissue]
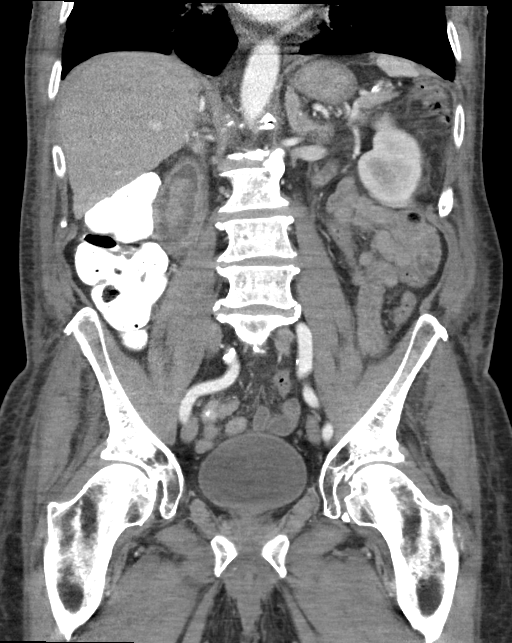

[15 of 46 positions shown; findings below may reference images not displayed]

FINDINGS: Lower chest: Small left pleural effusion and trace right pleural
effusion. Subsegmental dependent atelectasis posteriorly in the left
lower lobe. Linear scarring or platelike atelectasis medially at the
right lung base.

Hepatobiliary: Scattered punctate calcified granulomas. Gallbladder
physiologically distended. No biliary ductal dilatation. No focal
liver lesion. Portal vein patent.

Pancreas: Unremarkable. No pancreatic ductal dilatation or
surrounding inflammatory changes.

Spleen: Normal size. Scattered small calcified granulomas.

Adrenals/Urinary Tract: Left adrenal hypertrophy. Marked parenchymal
atrophy in the right kidney. Compensatory hypertrophy on the left.
No hydronephrosis. Urinary bladder physiologically distended.

Stomach/Bowel: Stomach is incompletely distended. Balloon-retention
gastrostomy catheter projects in good position. There is
fluid-distension of the proximal duodenum, with 2.2 x 3.8 cm
intraluminal soft tissue nodular density which may represent mass,
conceivably hematoma or ingested material. The remainder of the
small bowel is decompressed, unremarkable. Appendix not identified.
Residual contrast material and gas in the proximal colon,
decompressed distally.

Vascular/Lymphatic: Mild calcified aortic plaque without aneurysm or
stenosis. No abdominal or pelvic adenopathy.

Reproductive: Prostate is unremarkable.

Other: No ascites. No free air.

Musculoskeletal: Mild multilevel lumbar spondylitic change.
Bilateral hip DJD. No acute fracture or worrisome bone lesion.
IMPRESSION: 1. 2.2 cm intraluminal nodular density of the proximal duodenum, may
represent mass, ingested material, or possibly hematoma. If
clinically appropriate, consider elective outpatient endoscopy for
further characterization.
2. Small left and trace right pleural effusions.
3. Right renal parenchymal atrophy with compensatory hypertrophy on
the left.
4. Old granulomatous disease.
5. Lumbar and bilateral hip DJD.

Aortic Atherosclerosis ([4N]-[4N]).

## 2019-10-13 MED ORDER — SODIUM CHLORIDE 0.9% FLUSH
10.0000 mL | Freq: Two times a day (BID) | INTRAVENOUS | Status: DC
  Administered 2019-10-13 – 2019-10-16 (×6): 10 mL

## 2019-10-13 MED ORDER — LOSARTAN POTASSIUM 50 MG PO TABS
100.0000 mg | ORAL_TABLET | Freq: Every day | ORAL | Status: DC
  Administered 2019-10-14 – 2019-10-16 (×3): 100 mg
  Filled 2019-10-13 (×3): qty 2

## 2019-10-13 MED ORDER — SODIUM CHLORIDE 0.9% FLUSH: 10.0000 mL | INTRAVENOUS | Status: DC | PRN

## 2019-10-13 MED ORDER — CHLORHEXIDINE GLUCONATE CLOTH 2 % EX PADS
6.0000 | MEDICATED_PAD | Freq: Every day | CUTANEOUS | Status: DC
  Administered 2019-10-13 – 2019-10-16 (×4): 6 via TOPICAL

## 2019-10-13 MED ORDER — IOHEXOL 300 MG/ML  SOLN
100.0000 mL | Freq: Once | INTRAMUSCULAR | Status: AC | PRN
  Administered 2019-10-13: 100 mL via INTRAVENOUS

## 2019-10-13 NOTE — Progress Notes (Signed)
PROGRESS NOTE    Dustin Harrison  WJX:[id number] DOB: September 18, 1931 DOA: 10/07/2019 PCP: Jinny Sanders, MD    Brief Narrative:  Dustin Cambre Johnsonis a 86 y.o.malewith medical history significant forhypertension, diabetes mellitus and chronic low back pain who presents to the emergency room for evaluation of worsening low back pain and generalized weakness. Patient had noncontrasted MRI of cervical, thoracic and lumbar spines,showed a significant degenerative disc disease,but no evidence of osteomyelitis. Patient also had positive blood culture with group B streptococcus. Penicillin G is started after consultation with ID. 5/6.Patient continued to have significant dysphagia, patient was seen by ENT, patient had a significant right vocal cord paralysis.  Feeding tube replaced. 5/7.  TEE was performed, did not show evidence of endocarditis.   Assessment & Plan:   Principal Problem:   AKI (acute kidney injury) ([place]) Active Problems:   Diabetes mellitus without complication ([place])   Essential hypertension, benign   CKD (chronic kidney disease) stage 2, GFR 60-89 ml/min   Back pain   Leukocytosis   Cardiomyopathy ([place])   Bacteremia   HFrEF (heart failure with reduced ejection fraction) ([facility])   Malnutrition of moderate degree  #1.  Group B streptococcus bacteremia. TEE was performed yesterday, no evidence of endocarditis. We will go ahead to perform CT abdomen/pelvis with contrast to try to identify the source of infection. PICC line will be placed today versus tomorrow. Need Rocephin at time of discharge.  #2.  Dysphagia.   Secondary to vocal cord paralysis.  Tube feeding was started in bolus format yesterday.  Patient has been doing well.  #3.  Chronic systolic congestive heart failure with ejection fraction 25 to 30%. No evidence of exacerbation.  4.  Acute kidney injury. Renal function has normalized.  IV fluids were changed to [facility].  5.   Thrombocytopenia. Improved.  6.  Type 2 diabetes. Continue sliding scale insulin.  7.  Essential hypertension. Blood pressure still running high, increasing losartan to 100 mg daily via feeding tube.  8.  Right-sided weakness. Followed by neurology.   DVT prophylaxis:SCDs Code Status:Full Family Communication:Called the patient wife, updated patient condition. Disposition Plan:  Patient came from:Home  Anticipated d/c place:pending decison  Barriers to d/c OR conditions which need to be met to effect a safe d/c: Patient Rocephin of discharge.  This can be done at home.  Will await decision from physical therapy/Occupational Therapy and the decision from social service.  Consultants:  Neurology  Cardiology  Infectious disease  GI  ENT  Hematology  Procedures:None Antimicrobials:Penicillin G.       Subjective: Patient doing well today.  Tolerating tube feeding well.  No nausea vomiting abdominal pain. No fever or chills. No shortness of breath or cough.  Objective: Vitals:   10/12/19 1140 10/12/19 1142 10/12/19 2105 10/13/19 0429  BP: (!) 172/103 (!) 169/93 123/75 (!) 167/78  Pulse: 69 68 67 66  Resp: '16  18 18  '$ Temp: 97.7 F (36.5 C)  98 F (36.7 C) 97.9 F (36.6 C)  TempSrc: Oral  Oral Oral  SpO2: 96%  96% 98%  Weight:      Height:        Intake/Output Summary (Last 24 hours) at 10/13/2019 1009 Last data filed at 10/12/2019 1215 Gross per 24 hour  Intake --  Output 200 ml  Net -200 ml   Filed Weights   10/07/19 0338 10/11/19 1508  Weight: 87.1 kg 87.1 kg    Examination:  General exam: Appears calm and comfortable  Respiratory system: Clear to auscultation. Respiratory effort normal. Cardiovascular system: S1 & S2 heard, RRR. No JVD, murmurs, rubs, gallops or clicks. No pedal edema. Gastrointestinal system: Abdomen is  nondistended, soft and nontender. No organomegaly or masses felt. Normal bowel sounds heard. Central nervous system: Alert and oriented. No focal neurological deficits. Extremities: Symmetric  Skin: No rashes, lesions or ulcers Psychiatry: Judgement and insight appear normal. Mood & affect appropriate.     Data Reviewed: I have personally reviewed following labs and imaging studies  CBC: Recent Labs  Lab 10/07/19 0727 10/08/19 0511 10/09/19 0033 10/10/19 0415 10/11/19 0445 10/12/19 0509 10/13/19 0411  WBC 28.1*   < > 21.0* 15.5* 12.7* 13.9* 16.4*  NEUTROABS 24.3*  --   --   --  10.3* 11.4* 13.8*  HGB 14.4   < > 15.1 16.7 16.3 16.2 15.5  HCT 43.4   < > 44.7 48.2 48.2 48.0 44.8  MCV 93.3   < > 90.9 89.3 90.6 92.3 90.0  PLT 121*   < > 80* 74* 94* 120* 159   < > = values in this interval not displayed.   Basic Metabolic Panel: Recent Labs  Lab 10/08/19 0511 10/09/19 0033 10/11/19 0445 10/12/19 0509 10/13/19 0411  NA 135 135 138 142 144  K 4.2 4.3 4.1 4.4 4.3  CL 104 105 107 109 111  CO2 '23 24 23 25 26  '$ GLUCOSE 148* 164* 187* 167* 149*  BUN 48* 42* 54* 47* 38*  CREATININE 1.61* 1.22 1.38* 1.42* 1.21  CALCIUM 8.3* 8.0* 8.0* 7.8* 7.9*  MG  --   --  2.6* 2.6* 2.6*  PHOS  --   --   --   --  3.2   GFR: Estimated Creatinine Clearance: 47.5 mL/min (by C-G formula based on SCr of 1.21 mg/dL). Liver Function Tests: Recent Labs  Lab 10/11/19 0445  AST 32  ALT 40  ALKPHOS 86  BILITOT 1.1  PROT 5.8*  ALBUMIN 2.5*   No results for input(s): LIPASE, AMYLASE in the last 168 hours. No results for input(s): AMMONIA in the last 168 hours. Coagulation Profile: Recent Labs  Lab 10/11/19 0445  INR 1.1   Cardiac Enzymes: Recent Labs  Lab 10/07/19 0727  CKTOTAL 305   BNP (last 3 results) No results for input(s): PROBNP in the last 8760 hours. HbA1C: No results for input(s): HGBA1C in the last 72 hours. CBG: Recent Labs  Lab 10/12/19 0749 10/12/19 1137  10/12/19 1641 10/12/19 2107 10/13/19 0744  GLUCAP 151* 129* 137* 167* 153*   Lipid Profile: No results for input(s): CHOL, HDL, LDLCALC, TRIG, CHOLHDL, LDLDIRECT in the last 72 hours. Thyroid Function Tests: No results for input(s): TSH, T4TOTAL, FREET4, T3FREE, THYROIDAB in the last 72 hours. Anemia Panel: No results for input(s): VITAMINB12, FOLATE, FERRITIN, TIBC, IRON, RETICCTPCT in the last 72 hours. Sepsis Labs: Recent Labs  Lab 10/07/19 0831  LATICACIDVEN 1.7    Recent Results (from the past 240 hour(s))  Urine culture     Status: None   Collection Time: 10/07/19  7:27 AM   Specimen: Urine, Random  Result Value Ref Range Status   Specimen Description   Final    URINE, RANDOM Performed at [facility], [address], [place], [place] [address]    Special Requests   Final    NONE Performed at [facility], [address] [place], [place] [address]    Culture   Final    NO GROWTH Performed at [facility], [place] [address] [place], [place] [address]    Report Status 10/08/2019 FINAL  Final  Blood Culture (routine x 2)     Status: Abnormal   Collection Time: 10/07/19  8:31 AM   Specimen: BLOOD  Result Value Ref Range Status   Specimen Description   Final    BLOOD LAC Performed at [facility], [address], [place], [place] [address]    Special Requests   Final    BOTTLES DRAWN AEROBIC AND ANAEROBIC Blood Culture adequate volume Performed at [facility], [address] [place], [place] [address]    Culture  Setup Time   Final    GRAM POSITIVE COCCI IN BOTH AEROBIC AND ANAEROBIC BOTTLES CRITICAL RESULT CALLED TO, READ BACK BY AND VERIFIED WITH: ASAJAH DUNCAN ON 10/07/2019 AT 2035 TIK Performed at [facility], [address] [place], [place] [address]    Culture (A)  Final    GROUP B STREP(S.AGALACTIAE)ISOLATED SUSCEPTIBILITIES PERFORMED ON PREVIOUS CULTURE WITHIN THE LAST 5 DAYS. Performed at  [facility], [place] [address] [place], [place] [address]    Report Status 10/09/2019 FINAL  Final  Blood Culture (routine x 2)     Status: Abnormal   Collection Time: 10/07/19  8:31 AM   Specimen: BLOOD  Result Value Ref Range Status   Specimen Description   Final    BLOOD LWRIST Performed at [facility], [address], [place], [place] [address]    Special Requests   Final    BOTTLES DRAWN AEROBIC AND ANAEROBIC Blood Culture adequate volume Performed at [facility], [address] [place], [place] [address]    Culture  Setup Time   Final    GRAM POSITIVE COCCI IN BOTH AEROBIC AND ANAEROBIC BOTTLES CRITICAL RESULT CALLED TO, READ BACK BY AND VERIFIED WITH: ASAJAH DUNCAN ON 10/07/2019 AT 2035 TIK Performed at [facility], [place] [address] [place], [place] [address]    Culture GROUP B STREP(S.AGALACTIAE)ISOLATED (A)  Final   Report Status 10/09/2019 FINAL  Final   Organism ID, Bacteria GROUP B STREP(S.AGALACTIAE)ISOLATED  Final      Susceptibility   Group b strep(s.agalactiae)isolated - MIC*    CLINDAMYCIN <=0.25 SENSITIVE Sensitive     AMPICILLIN <=0.25 SENSITIVE Sensitive     ERYTHROMYCIN <=0.12 SENSITIVE Sensitive     VANCOMYCIN 0.5 SENSITIVE Sensitive     CEFTRIAXONE <=0.12 SENSITIVE Sensitive     LEVOFLOXACIN 1 SENSITIVE Sensitive     PENICILLIN Value in next row Sensitive      SENSITIVE0.06    * GROUP B STREP(S.AGALACTIAE)ISOLATED  Blood Culture ID Panel (Reflexed)     Status: Abnormal   Collection Time: 10/07/19  8:31 AM  Result Value Ref Range Status   Enterococcus species NOT DETECTED NOT DETECTED Final   Listeria monocytogenes NOT DETECTED NOT DETECTED Final   Staphylococcus species NOT DETECTED NOT DETECTED Final   Staphylococcus aureus (BCID) NOT DETECTED NOT DETECTED Final   Streptococcus species DETECTED (A) NOT DETECTED Final    Comment: CRITICAL RESULT CALLED TO, READ BACK BY AND VERIFIED WITH: ASAJAH DUNCAN ON 10/07/2019  AT 2035 TIK    Streptococcus agalactiae DETECTED (A) NOT DETECTED Final    Comment: CRITICAL RESULT CALLED TO, READ BACK BY AND VERIFIED WITH: ASAJAH DUNCAN ON 10/07/2019 AT 2035 TIK    Streptococcus pneumoniae NOT DETECTED NOT DETECTED Final   Streptococcus pyogenes NOT DETECTED NOT DETECTED Final   Acinetobacter baumannii NOT  DETECTED NOT DETECTED Final   Enterobacteriaceae species NOT DETECTED NOT DETECTED Final   Enterobacter cloacae complex NOT DETECTED NOT DETECTED Final   Escherichia coli NOT DETECTED NOT DETECTED Final   Klebsiella oxytoca NOT DETECTED NOT DETECTED Final   Klebsiella pneumoniae NOT DETECTED NOT DETECTED Final   Proteus species NOT DETECTED NOT DETECTED Final   Serratia marcescens NOT DETECTED NOT DETECTED Final   Haemophilus influenzae NOT DETECTED NOT DETECTED Final   Neisseria meningitidis NOT DETECTED NOT DETECTED Final   Pseudomonas aeruginosa NOT DETECTED NOT DETECTED Final   Candida albicans NOT DETECTED NOT DETECTED Final   Candida glabrata NOT DETECTED NOT DETECTED Final   Candida krusei NOT DETECTED NOT DETECTED Final   Candida parapsilosis NOT DETECTED NOT DETECTED Final   Candida tropicalis NOT DETECTED NOT DETECTED Final    Comment: Performed at [facility], [address] [place], [place] [address]  Respiratory Panel by RT PCR (Flu A&B, Covid) - Nasopharyngeal Swab     Status: None   Collection Time: 10/07/19 12:27 PM   Specimen: Nasopharyngeal Swab  Result Value Ref Range Status   SARS Coronavirus 2 by RT PCR NEGATIVE NEGATIVE Final    Comment: (NOTE) SARS-CoV-2 target nucleic acids are NOT DETECTED. The SARS-CoV-2 RNA is generally detectable in upper respiratoy specimens during the acute phase of infection. The lowest concentration of SARS-CoV-2 viral copies this assay can detect is 131 copies/mL. A negative result does not preclude SARS-Cov-2 infection and should not be used as the sole basis for treatment or other patient  management decisions. A negative result may occur with  improper specimen collection/handling, submission of specimen other than nasopharyngeal swab, presence of viral mutation(s) within the areas targeted by this assay, and inadequate number of viral copies (<131 copies/mL). A negative result must be combined with clinical observations, patient history, and epidemiological information. The expected result is Negative. Fact Sheet for Patients:  PinkCheek.be Fact Sheet for Healthcare Providers:  GravelBags.it This test is not yet ap proved or cleared by the [place] FDA and  has been authorized for detection and/or diagnosis of SARS-CoV-2 by FDA under an Emergency Use Authorization (EUA). This EUA will remain  in effect (meaning this test can be used) for the duration of the COVID-19 declaration under Section 564(b)(1) of the Act, 21 U.S.C. section 360bbb-3(b)(1), unless the authorization is terminated or revoked sooner.    Influenza A by PCR NEGATIVE NEGATIVE Final   Influenza B by PCR NEGATIVE NEGATIVE Final    Comment: (NOTE) The Xpert Xpress SARS-CoV-2/FLU/RSV assay is intended as an aid in  the diagnosis of influenza from Nasopharyngeal swab specimens and  should not be used as a sole basis for treatment. Nasal washings and  aspirates are unacceptable for Xpert Xpress SARS-CoV-2/FLU/RSV  testing. Fact Sheet for Patients: PinkCheek.be Fact Sheet for Healthcare Providers: GravelBags.it This test is not yet approved or cleared by the [place] FDA and  has been authorized for detection and/or diagnosis of SARS-CoV-2 by  FDA under an Emergency Use Authorization (EUA). This EUA will remain  in effect (meaning this test can be used) for the duration of the  Covid-19 declaration under Section 564(b)(1) of the Act, 21  U.S.C. section 360bbb-3(b)(1), unless the  authorization is  terminated or revoked. Performed at [facility], [address] [place], [place] [address]   Culture, blood (Routine X 2) w Reflex to ID Panel     Status: None (Preliminary result)   Collection Time: 10/09/19  12:31 AM   Specimen: BLOOD LEFT WRIST  Result Value Ref Range Status   Specimen Description BLOOD LEFT WRIST  Final   Special Requests   Final    BOTTLES DRAWN AEROBIC AND ANAEROBIC Blood Culture adequate volume   Culture   Final    NO GROWTH 4 DAYS Performed at [facility], [address] [place], [place] [address]    Report Status PENDING  Incomplete  Culture, blood (Routine X 2) w Reflex to ID Panel     Status: None (Preliminary result)   Collection Time: 10/09/19 12:33 AM   Specimen: BLOOD LEFT HAND  Result Value Ref Range Status   Specimen Description BLOOD LEFT HAND  Final   Special Requests   Final    BOTTLES DRAWN AEROBIC AND ANAEROBIC Blood Culture adequate volume   Culture   Final    NO GROWTH 4 DAYS Performed at [facility], [address], [place], [place] [address]    Report Status PENDING  Incomplete         Radiology Studies: ECHO TEE  Result Date: 10/12/2019    TRANSESOPHOGEAL ECHO REPORT   Patient Name:   Dustin Harrison Date of Exam: 10/12/2019 Medical Rec #:  [id number]         Height:       71.0 in Accession #:    [id number]        Weight:       192.0 lb Date of Birth:  12-Oct-1931          BSA:          2.072 m Patient Age:    66 years          BP:           162/97 mmHg Patient Gender: M                 HR:           73 bpm. Exam Location:  [facility] Procedure: Transesophageal Echo, Cardiac Doppler and Color Doppler Indications:     Bacteremia 790.7  History:         Patient has prior history of Echocardiogram examinations, most                  recent 10/08/2019. Risk Factors:Hypertension and Diabetes.  Sonographer:     Sherrie Sport RDCS (AE) Referring Phys:  [id number] [place] Diagnosing Phys: Ida Rogue MD  PROCEDURE: After discussion of the risks and benefits of a TEE, an informed consent was obtained from the patient. The transesophogeal probe was passed without difficulty through the esophogus of the patient. Local oropharyngeal anesthetic was provided with viscous lidocaine and Cetacaine. Sedation performed by performing physician. Image quality was technically difficult. The patient developed no complications during the procedure. IMPRESSIONS  1. Left ventricular ejection fraction, by estimation, is 25 to 30%. The left ventricle has severely decreased function. The left ventricle demonstrates global hypokinesis.  2. Right ventricular systolic function is normal. The right ventricular size is normal. Tricuspid regurgitation signal is inadequate for assessing PA pressure.  3. The mitral valve is normal in structure. No evidence of mitral valve regurgitation.  4. Aortic valve regurgitation is not visualized. Severe calcification of aortic valve. Visually, there appears to be moderate aortic valve stenosis.  5. Grossly, no valve vegetation noted. FINDINGS  Left Ventricle: Left ventricular ejection fraction, by estimation, is 25 to 30%. The left ventricle has severely decreased function. The left ventricle  demonstrates global hypokinesis. The left ventricular internal cavity size was normal in size. There is no left ventricular hypertrophy. Right Ventricle: The right ventricular size is normal. No increase in right ventricular wall thickness. Right ventricular systolic function is normal. Tricuspid regurgitation signal is inadequate for assessing PA pressure. Left Atrium: Left atrial size was normal in size. left atrial/left atrial appendage was not well visualized Right Atrium: Right atrial size was normal in size. Pericardium: The pericardium was not well visualized. Mitral Valve: The mitral valve is normal in structure. No evidence of mitral valve regurgitation. Tricuspid Valve: The tricuspid valve is not well  visualized. Tricuspid valve regurgitation is not demonstrated. Aortic Valve: The aortic valve is normal in structure. Aortic valve regurgitation is not visualized. Moderate aortic stenosis is present. Pulmonic Valve: The pulmonic valve was not well visualized. Pulmonic valve regurgitation is not visualized. Aorta: The aortic root was not well visualized. IAS/Shunts: The interatrial septum was not well visualized. Ida Rogue MD Electronically signed by Ida Rogue MD Signature Date/Time: 10/12/2019/3:35:48 PM    Final    [place] EKG SITE RITE  Result Date: 10/12/2019 If Site Rite image not attached, placement could not be confirmed due to current cardiac rhythm.       Scheduled Meds: . carvedilol  6.25 mg Oral BID WC  . cloNIDine  0.1 mg Transdermal Weekly  . cyclobenzaprine  5 mg Oral TID  . feeding supplement (JEVITY 1.5 CAL/FIBER)  240 mL Per Tube 6 X Daily  . feeding supplement (PRO-STAT SUGAR FREE 64)  30 mL Per Tube Daily  . finasteride  5 mg Oral Daily  . free water  120 mL Per Tube 6 X Daily  . insulin aspart  0-15 Units Subcutaneous TID WC  . insulin aspart  4 Units Subcutaneous TID WC  . lidocaine  1 patch Transdermal Q24H  . [START ON 10/14/2019] losartan  100 mg Per Tube Daily  . psyllium  1 packet Oral Daily  . tamsulosin  0.8 mg Oral Daily  . timolol  1 drop Both Eyes BID   Continuous Infusions: . penicillin g continuous IV infusion 12 Million Units (10/12/19 1845)     LOS: 6 days    Time spent: 28 minutes    Sharen Hones, MD [facility]   To contact the attending provider between 7A-7P or the covering provider during after hours 7P-7A, please log into the web site [email or web address] and access using universal [place] password for that web site. If you do not have the password, please call the hospital operator.  10/13/2019, 10:09 AM

## 2019-10-13 NOTE — Progress Notes (Signed)
  Speech Language Pathology Treatment: Dysphagia  Patient Details Name: Dustin Harrison MRN: [id number] DOB: 1931/09/20 Today's Date: 10/13/2019 Time: 1020-1100 SLP Time Calculation (min) (ACUTE ONLY): 40 min  Assessment / Plan / Recommendation Clinical Impression  Pt seen today for ongoing education on his Dysphagia (post MBSSs) and instruction on Therapeutic ice chips to maintain oropharyngeal swallowing function as well as provide Pleasure in light of NPO status, PEG placement. PEG was placed on 10/11/2019 for full nutrition/hydration needs s/p 2 failed MBSSs indicating frank Aspiration of thin liquids, and laryngeal Penetration of Nectar and Puree consistencies - pt also exhibits Significant pharyngeal residue and decreased laryngeal sensation. He is at High risk for aspiration and Pulmonary decline from such.     HPI HPI: Pt is a 86 y.o. male with medical history significant for hypertension, diabetes mellitus, cochlear implant on L w/ HA on R, and chronic low back pain who presents to the emergency room for evaluation of worsening low back pain and generalized weakness.  Patient also complains of pain in his neck which is new.  Per EMS patient has had 2 falls in the past week  but patient is unable to recall the details of the falls. MRI of the cervical spine showed cervical spondylosis and degenerative disc disease, causing prominent impingement at C3-4 and C4-5; moderate impingement at C2-3; and mild impingement at C5-6 and C7-T1. MRI of the lumbar spine showed lumbar spondylosis and degenerative disc disease, causing moderate impingement at L4-5 and mild impingement at L3-4 and L5-S1.  Of note, per past history, pt has been told he has a cough "because of tight esophagus". Pt endorsed ongoing swallowing problems "for awhile now" when asked.  Pt is currently being assessed/followed by Neurology, ENT d/t apparent Neuromuscular changes in presentation; ENT has noted R vocal cord paralysis.  2 MBSSs  have indicated significant pharyngeal phase dysphagia w/ Aspiration and need for alternative means of feeding for nutrition/hydration needs. PEG placed on 10/11/2019. Pt's vocal quality remains Dysphonic.       SLP Plan  Goals updated       Recommendations  Diet recommendations: NPO(w/ therapeutic ice chips) Medication Administration: Via alternative means(PEG) Supervision: Patient able to self feed;Intermittent supervision to cue for compensatory strategies(w/ ice chips) Compensations: Minimize environmental distractions;Slow rate;Small sips/bites;Multiple dry swallows after each bite/sip Postural Changes and/or Swallow Maneuvers: Seated upright 90 degrees                General recommendations: (Dietician following) Oral Care Recommendations: Oral care QID;Oral care prior to ice chip/H20;Staff/trained caregiver to provide oral care(continue to support pt w/ ) Follow up Recommendations: Skilled Nursing facility(TBD) SLP Visit Diagnosis: Dysphagia, oropharyngeal phase (R13.12);Dysphagia, pharyngoesophageal phase (R13.14) Plan: Goals updated       [place], MS, CCC-SLP Adriane Guglielmo 10/13/2019, 11:07 AM

## 2019-10-13 NOTE — Progress Notes (Signed)
Spoke with Jodie RN re PICC order for home ABT.  States no plan for d/c at this time, PIV x 2 working well.  Jodie RN to notify via securechat if d/c plans for today.  Will plan on PICC placement today or 10/13/19.

## 2019-10-13 NOTE — Progress Notes (Signed)
Physical Therapy Treatment Patient Details Name: Dustin Harrison MRN: [id number] DOB: September 13, 1931 Today's Date: 10/13/2019    History of Present Illness Pt. is an 86 y.o. male who presented to ER secondary to progressive neck/back pain and R UE weakness after fall 2 days prior to admission; admitted for management of AKI and bacteremia (unknown source at this time).  C-spine, T-spine and L-spine imaging significant for prominent C3-4 and C4-5 impingement, moderate C2-3 and L4-5 impingement and mild C5-6, C7-T1, L3-4 and L5-S1 impingement.  MRI attempted to evaluate for CVA; unable to obtain accurate imaging due to artifact from cochlear implant.  Hospital course additionally significant for PEG placement due to vocal cord paralysis.    PT Comments    Patient continues to make progress towards all mobility goals this date; improved functional use of R UE noted (though requires min cuing for attention/integration into functional tasks).  Able to maintain grasp and appropriately utilize RW for mobility tasks today; will continue gait training with RW to maximize attention/functional use of R UE. Up in chair, needs in reach end of session; SLP present for treatment session.    Follow Up Recommendations  CIR     Equipment Recommendations       Recommendations for Other Services       Precautions / Restrictions Precautions Precautions: Fall Precaution Comments: NPO, PEG Restrictions Weight Bearing Restrictions: No    Mobility  Bed Mobility Overal bed mobility: Needs Assistance Bed Mobility: Supine to Sit     Supine to sit: Min guard;Supervision     General bed mobility comments: from flat surface, transition towards R side of bed; improved functional use of R UE this date, improved flexibility/rotation/dissociation of trunk and extremities noted  Transfers Overall transfer level: Needs assistance   Transfers: Sit to/from Stand Sit to Stand: Min assist         General  transfer comment: cuing for hand placement; multiple attempts required  Ambulation/Gait Ambulation/Gait assistance: Min guard;Min assist Gait Distance (Feet): 200 Feet Assistive device: Rolling walker (2 wheeled)   Gait velocity: 10' walk time, 11-12 seconds   General Gait Details: reciprocal stepping pattern with decreased coordination/control to R LE; increased force of contact R LE heel strike/contact.  Mild/mod gait deviation with dynamic gait components; limited balance reactions evident. Improved functional use of R UE, able to maintain grasp on RW this date (though proprioceptive deficits, mild inattention to R UE evident).  Did require 3 standing rest periods to complete distance due to SOB; sats stable and WFL on RA.   Stairs             Wheelchair Mobility    Modified Rankin (Stroke Patients Only)       Balance Overall balance assessment: Needs assistance Sitting-balance support: No upper extremity supported;Feet supported Sitting balance-Leahy Scale: Good     Standing balance support: Bilateral upper extremity supported Standing balance-Leahy Scale: Fair                              Cognition Arousal/Alertness: Awake/alert Behavior During Therapy: WFL for tasks assessed/performed Overall Cognitive Status: Within Functional Limits for tasks assessed                                 General Comments: good awareness of deficits, precautions      Exercises Other Exercises Other Exercises: Sit/stand from various surface  heights (edge of bed, standard toilet, recliner) with RW/grab bar, min assist.  Increased effort, use of momentum required from lower seating surfaces.  Min cuing for integration of R UE into movement transitions as able Other Exercises: Standing balance at sink for light grooming, oral care, cga/close sup; maintains broad BOS, but nearly symmetrical WBing bilat LEs.  Strong preference evident for L UE with activities;  does integrate R UE with cuing.    General Comments        Pertinent Vitals/Pain Pain Assessment: No/denies pain Pain Score: 0-No pain    Home Living                      Prior Function            PT Goals (current goals can now be found in the care plan section) Acute Rehab PT Goals Patient Stated Goal: get back home to care for my wife PT Goal Formulation: With patient/family Time For Goal Achievement: 10/22/19 Potential to Achieve Goals: Good Progress towards PT goals: Progressing toward goals    Frequency    7X/week      PT Plan Current plan remains appropriate    Co-evaluation              AM-PAC PT "6 Clicks" Mobility   Outcome Measure  Help needed turning from your back to your side while in a flat bed without using bedrails?: A Little Help needed moving from lying on your back to sitting on the side of a flat bed without using bedrails?: A Little Help needed moving to and from a bed to a chair (including a wheelchair)?: A Little Help needed standing up from a chair using your arms (e.g., wheelchair or bedside chair)?: A Little Help needed to walk in hospital room?: A Little Help needed climbing 3-5 steps with a railing? : A Lot 6 Click Score: 17    End of Session Equipment Utilized During Treatment: Gait belt Activity Tolerance: Patient tolerated treatment well Patient left: with call bell/phone within reach;in chair;with chair alarm set Nurse Communication: Mobility status PT Visit Diagnosis: Muscle weakness (generalized) (M62.81);Difficulty in walking, not elsewhere classified (R26.2);Hemiplegia and hemiparesis Hemiplegia - Right/Left: Right Hemiplegia - dominant/non-dominant: Dominant Hemiplegia - caused by: Unspecified     Time: [id number] PT Time Calculation (min) (ACUTE ONLY): 35 min  Charges:  $Gait Training: 8-22 mins $Therapeutic Activity: 8-22 mins                     Daxtyn Rottenberg H. Owens Shark, PT, DPT, NCS 10/13/19, 10:51  AM [phone]

## 2019-10-13 NOTE — Progress Notes (Signed)
Peripherally Inserted Central Catheter Placement  The IV Nurse has discussed with the patient and/or persons authorized to consent for the patient, the purpose of this procedure and the potential benefits and risks involved with this procedure.  The benefits include less needle sticks, lab draws from the catheter, and the patient may be discharged home with the catheter. Risks include, but not limited to, infection, bleeding, blood clot (thrombus formation), and puncture of an artery; nerve damage and irregular heartbeat and possibility to perform a PICC exchange if needed/ordered by physician.  Alternatives to this procedure were also discussed.  Bard Power PICC patient education guide, fact sheet on infection prevention and patient information card has been provided to patient /or left at bedside. Daughter and wife present at bedside, all questions answered.    PICC Placement Documentation  PICC Single Lumen 10/13/19 PICC Right Brachial 40 cm 0 cm (Active)  Indication for Insertion or Continuance of Line Home intravenous therapies (PICC only) 10/13/19 1808  Exposed Catheter (cm) 0 cm 10/13/19 1808  Site Assessment Clean;Dry;Intact 10/13/19 1808  Line Status Flushed;Saline locked;Blood return noted 10/13/19 1808  Dressing Type Transparent 10/13/19 1808  Dressing Status Clean;Dry;Intact;Antimicrobial disc in place 10/13/19 1808  Safety Lock Not Applicable [id number]  Line Care Tubing changed;Connections checked and tightened 10/13/19 1808  Line Adjustment (NICU/IV Team Only) No 10/13/19 1808  Dressing Intervention New dressing 10/13/19 1808  Dressing Change Due 10/20/19 10/13/19 1808       Rolena Infante 10/13/2019, 6:10 PM

## 2019-10-13 NOTE — Progress Notes (Signed)
Progress Note  Patient Name: Dustin Harrison Date of Encounter: 10/13/2019  Primary Cardiologist: Dustin Rogue, MD   Subjective   Patient hard of hearing, cochlear implant improved conversation, and patient's daughter and wife also present. No acute complaints. Had questions regarding why the heart function might be abnormal. Discussed what causes this, how we evaluate it, and management options. Reviewed his current medications and how these will assist [place]. All questions answered.  Inpatient Medications    Scheduled Meds: . carvedilol  6.25 mg Oral BID WC  . cloNIDine  0.1 mg Transdermal Weekly  . cyclobenzaprine  5 mg Oral TID  . feeding supplement (JEVITY 1.5 CAL/FIBER)  240 mL Per Tube 6 X Daily  . feeding supplement (PRO-STAT SUGAR FREE 64)  30 mL Per Tube Daily  . finasteride  5 mg Oral Daily  . free water  120 mL Per Tube 6 X Daily  . insulin aspart  0-15 Units Subcutaneous TID WC  . insulin aspart  4 Units Subcutaneous TID WC  . lidocaine  1 patch Transdermal Q24H  . [START ON 10/14/2019] losartan  100 mg Per Tube Daily  . psyllium  1 packet Oral Daily  . tamsulosin  0.8 mg Oral Daily  . timolol  1 drop Both Eyes BID   Continuous Infusions: . penicillin g continuous IV infusion 12 Million Units (10/12/19 1845)   PRN Meds: HYDROcodone-acetaminophen, labetalol, morphine injection, ondansetron **OR** ondansetron (ZOFRAN) IV   Vital Signs    Vitals:   10/12/19 1142 10/12/19 2105 10/13/19 0429 10/13/19 1149  BP: (!) 169/93 123/75 (!) 167/78 135/83  Pulse: 68 67 66 86  Resp:  18 18 18   Temp:  98 F (36.7 C) 97.9 F (36.6 C) 98.7 F (37.1 C)  TempSrc:  Oral Oral Oral  SpO2:  96% 98% 100%  Weight:      Height:       No intake or output data in the 24 hours ending 10/13/19 1248 Last 3 Weights 10/11/2019 10/07/2019 05/25/2019  Weight (lbs) 192 lb 0.3 oz 192 lb 199 lb  Weight (kg) 87.1 kg 87.091 kg 90.266 kg      Telemetry   NSR - Personally Reviewed  ECG    5/2 sinus rhythm with PAC - Personally Reviewed  Physical Exam   GEN: No acute distress.  Frail appearing elderly gentleman Neck: No JVD visible Cardiac: RRR, no murmurs, rubs, or gallops.  Respiratory: Clear to auscultation bilaterally. GI: PEG tube in place, receiving tube feeds by nurse on interview MS: No edema; No deformity. Neuro:  Nonfocal, hard of hearing Psych: Normal affect   Labs    High Sensitivity Troponin:  No results for input(s): TROPONINIHS in the last 720 hours.    Chemistry Recent Labs  Lab 10/11/19 0445 10/12/19 0509 10/13/19 0411  NA 138 142 144  K 4.1 4.4 4.3  CL 107 109 111  CO2 23 25 26   GLUCOSE 187* 167* 149*  BUN 54* 47* 38*  CREATININE 1.38* 1.42* 1.21  CALCIUM 8.0* 7.8* 7.9*  PROT 5.8*  --   --   ALBUMIN 2.5*  --   --   AST 32  --   --   ALT 40  --   --   ALKPHOS 86  --   --   BILITOT 1.1  --   --   GFRNONAA 46* 45* 54*  GFRAA 54* 52* >60  ANIONGAP 8 8 7      Hematology Recent Labs  Lab 10/11/19 0445 10/12/19 0509 10/13/19 0411  WBC 12.7* 13.9* 16.4*  RBC 5.32 5.20 4.98  HGB 16.3 16.2 15.5  HCT 48.2 48.0 44.8  MCV 90.6 92.3 90.0  MCH 30.6 31.2 31.1  MCHC 33.8 33.8 34.6  RDW 13.1 13.2 13.5  PLT 94* 120* 159    BNPNo results for input(s): BNP, PROBNP in the last 168 hours.   DDimer No results for input(s): DDIMER in the last 168 hours.   Radiology    CT ABDOMEN PELVIS W CONTRAST  Result Date: 10/13/2019 CLINICAL DATA:  Hypertension, diabetes , chronic low back pain worsening, weakness, bacteremia EXAM: CT ABDOMEN AND PELVIS WITH CONTRAST TECHNIQUE: Multidetector CT imaging of the abdomen and pelvis was performed using the standard protocol following bolus administration of intravenous contrast. CONTRAST:  174mL OMNIPAQUE IOHEXOL 300 MG/ML  SOLN COMPARISON:  None. FINDINGS: Lower chest: Small left pleural effusion and trace right pleural effusion. Subsegmental dependent atelectasis posteriorly in the left lower lobe. Linear  scarring or platelike atelectasis medially at the right lung base. Hepatobiliary: Scattered punctate calcified granulomas. Gallbladder physiologically distended. No biliary ductal dilatation. No focal liver lesion. Portal vein patent. Pancreas: Unremarkable. No pancreatic ductal dilatation or surrounding inflammatory changes. Spleen: Normal size. Scattered small calcified granulomas. Adrenals/Urinary Tract: Left adrenal hypertrophy. Marked parenchymal atrophy in the right kidney. Compensatory hypertrophy on the left. No hydronephrosis. Urinary bladder physiologically distended. Stomach/Bowel: Stomach is incompletely distended. Balloon-retention gastrostomy catheter projects in good position. There is fluid-distension of the proximal duodenum, with 2.2 x 3.8 cm intraluminal soft tissue nodular density which may represent mass, conceivably hematoma or ingested material. The remainder of the small bowel is decompressed, unremarkable. Appendix not identified. Residual contrast material and gas in the proximal colon, decompressed distally. Vascular/Lymphatic: Mild calcified aortic plaque without aneurysm or stenosis. No abdominal or pelvic adenopathy. Reproductive: Prostate is unremarkable. Other: No ascites. No free air. Musculoskeletal: Mild multilevel lumbar spondylitic change. Bilateral hip DJD. No acute fracture or worrisome bone lesion. IMPRESSION: 1. 2.2 cm intraluminal nodular density of the proximal duodenum, may represent mass, ingested material, or possibly hematoma. If clinically appropriate, consider elective outpatient endoscopy for further characterization. 2. Small left and trace right pleural effusions. 3. Right renal parenchymal atrophy with compensatory hypertrophy on the left. 4. Old granulomatous disease. 5. Lumbar and bilateral hip DJD. Aortic Atherosclerosis (ICD10-I70.0). Electronically Signed   By: Lucrezia Europe M.D.   On: 10/13/2019 12:06   ECHO TEE  Result Date: 10/12/2019    TRANSESOPHOGEAL  ECHO REPORT   Patient Name:   Dustin Harrison Date of Exam: 10/12/2019 Medical Rec #:  [id number]         Height:       71.0 in Accession #:    [id number]        Weight:       192.0 lb Date of Birth:  1931-09-05          BSA:          2.072 m Patient Age:    19 years          BP:           162/97 mmHg Patient Gender: M                 HR:           73 bpm. Exam Location:  [facility] Procedure: Transesophageal Echo, Cardiac Doppler and Color Doppler Indications:     Bacteremia 790.7  History:  Patient has prior history of Echocardiogram examinations, most                  recent 10/08/2019. Risk Factors:Hypertension and Diabetes.  Sonographer:     Sherrie Sport RDCS (AE) Referring Phys:  [id number] [place] Diagnosing Phys: Dustin Rogue MD PROCEDURE: After discussion of the risks and benefits of a TEE, an informed consent was obtained from the patient. The transesophogeal probe was passed without difficulty through the esophogus of the patient. Local oropharyngeal anesthetic was provided with viscous lidocaine and Cetacaine. Sedation performed by performing physician. Image quality was technically difficult. The patient developed no complications during the procedure. IMPRESSIONS  1. Left ventricular ejection fraction, by estimation, is 25 to 30%. The left ventricle has severely decreased function. The left ventricle demonstrates global hypokinesis.  2. Right ventricular systolic function is normal. The right ventricular size is normal. Tricuspid regurgitation signal is inadequate for assessing PA pressure.  3. The mitral valve is normal in structure. No evidence of mitral valve regurgitation.  4. Aortic valve regurgitation is not visualized. Severe calcification of aortic valve. Visually, there appears to be moderate aortic valve stenosis.  5. Grossly, no valve vegetation noted. FINDINGS  Left Ventricle: Left ventricular ejection fraction, by estimation, is 25 to 30%. The left ventricle has severely decreased function.  The left ventricle demonstrates global hypokinesis. The left ventricular internal cavity size was normal in size. There is no left ventricular hypertrophy. Right Ventricle: The right ventricular size is normal. No increase in right ventricular wall thickness. Right ventricular systolic function is normal. Tricuspid regurgitation signal is inadequate for assessing PA pressure. Left Atrium: Left atrial size was normal in size. left atrial/left atrial appendage was not well visualized Right Atrium: Right atrial size was normal in size. Pericardium: The pericardium was not well visualized. Mitral Valve: The mitral valve is normal in structure. No evidence of mitral valve regurgitation. Tricuspid Valve: The tricuspid valve is not well visualized. Tricuspid valve regurgitation is not demonstrated. Aortic Valve: The aortic valve is normal in structure. Aortic valve regurgitation is not visualized. Moderate aortic stenosis is present. Pulmonic Valve: The pulmonic valve was not well visualized. Pulmonic valve regurgitation is not visualized. Aorta: The aortic root was not well visualized. IAS/Shunts: The interatrial septum was not well visualized. Dustin Rogue MD Electronically signed by Dustin Rogue MD Signature Date/Time: 10/12/2019/3:35:48 PM    Final    [place] EKG SITE RITE  Result Date: 10/12/2019 If Site Rite image not attached, placement could not be confirmed due to current cardiac rhythm.   Cardiac Studies   TTE 10/08/19 1. Left ventricular ejection fraction, by estimation, is 25 to 30%. The  left ventricle has severely decreased function. The left ventricle has no  regional wall motion abnormalities. There is moderate left ventricular  hypertrophy. Left ventricular  diastolic parameters are consistent with Grade I diastolic dysfunction  (impaired relaxation). There is severe akinesis of the left ventricular,  mid-apical anteroseptal wall, anterolateral wall, anterior segment, apical  segment and  inferoseptal wall. Wall  motion abnormalities are suggestive of stress induced cardiomyopathy vs.  LAD infarct.  2. Right ventricular systolic function is normal. The right ventricular  size is normal. There is mildly elevated pulmonary artery systolic  pressure.  3. Left atrial size was mildly dilated.  4. The mitral valve is normal in structure. No evidence of mitral valve  regurgitation. No evidence of mitral stenosis.  5. The aortic valve is normal in structure. Aortic valve regurgitation is  not visualized. Mild to moderate aortic valve sclerosis/calcification is  present, without any evidence of aortic stenosis.  6. The inferior vena cava is normal in size with greater than 50%  respiratory variability, suggesting right atrial pressure of 3 mmHg.  7. No clear vegetations but suboptimal study.   TEE 10/12/19 1. Left ventricular ejection fraction, by estimation, is 25 to 30%. The  left ventricle has severely decreased function. The left ventricle  demonstrates global hypokinesis.  2. Right ventricular systolic function is normal. The right ventricular  size is normal. Tricuspid regurgitation signal is inadequate for assessing  PA pressure.  3. The mitral valve is normal in structure. No evidence of mitral valve  regurgitation.  4. Aortic valve regurgitation is not visualized. Severe calcification of  aortic valve. Visually, there appears to be moderate aortic valve  stenosis.  5. Grossly, no valve vegetation noted.    Patient Profile     86 y.o. male with PMH hypertension, type II diabetes, pheochromocytoma, presenting with fevers/chills and found to be bacteremic with newly reduced EF 25-30%.  Assessment & Plan    Bacteremia: -group B strep per cultures -TEE without evidence of endocarditis 10/12/19 -PICC line in near future, plan for long term anitbiotics per ID  Cardiomyopathy: unclear etiology, no known prior history of systolic heart failure -EF  25-30% -recommend that once he has completed treatment for his bacteremia, he can then undergo outpatient ischemic evaluation -blood pressures elevated this AM. Primary team has increase losartan to 100 mg daily per tube -continue carvedilol 6.25 mg BID per tube -on clonidine patch, ideally would like to titrate this off and use goal directed medical therapy, but with change to losartan today will monitor BP for now -monitor renal function given his CKD. Ideally would like to transition to entresto -net negative 1.7 L this admission, does not have daily weights -appears euvolemic on exam.  For questions or updates, please contact [place] Please consult [email or web address] for contact info under    Signed, Buford Dresser, MD  10/13/2019, 12:48 PM

## 2019-10-13 NOTE — Progress Notes (Signed)
Occupational Therapy Treatment Patient Details Name: Dustin Harrison MRN: [id number] DOB: [birth year] Today's Date: 10/13/2019    History of present illness Pt. is an 86 y.o. male who presented to ER secondary to progressive neck/back pain and R UE weakness after fall 2 days prior to admission; admitted for management of AKI and bacteremia (unknown source at this time).  C-spine, T-spine and L-spine imaging significant for prominent C3-4 and C4-5 impingement, moderate C2-3 and L4-5 impingement and mild C5-6, C7-T1, L3-4 and L5-S1 impingement.  MRI attempted to evaluate for CVA; unable to obtain accurate imaging due to artifact from cochlear implant.  Hospital course additionally significant for PEG placement due to vocal cord paralysis.   OT comments  Dustin Harrison seen for OT treatment on this date. Upon arrival to room pt awake/alert, semi-supine in bed with wife and dtr present at bedside. Pt denies pain this date and eager to participate in OT tx session. Pt requesting to perform shaving. Cleared by RN. OT introduces use of built up handle to support improved functional use of pt (dominant) RUE. Pt requires set-up of materials, min assist to apply shaving cream, Min A for functional use of RUE and consistent cueing for functional inclusion of RUE this date. Pt performs grooming and upper body bathing tasks in seated position with occasional MIN A required to support balance during dynamic tasks. OT facilitates additional ADL management as described below. See ADL section for additional detail regarding occupational performance. Pt and caregivers educated on safe use of built-up handle on various items to support functional independence with ADL management. All return verbalize/demo understanding. Pt is making excellent progress toward goals and continues to benefit from skilled OT services to maximize return to PLOF and minimize risk of future falls, injury, caregiver burden, and readmission. Will  continue to follow POC as written. Discharge recommendation for CIR remains appropriate.    Follow Up Recommendations  CIR    Equipment Recommendations  Other (comment)    Recommendations for Other Services      Precautions / Restrictions Precautions Precautions: Fall Precaution Comments: NPO, PEG Restrictions Weight Bearing Restrictions: No       Mobility Bed Mobility Overal bed mobility: Needs Assistance Bed Mobility: Supine to Sit     Supine to sit: Min assist Sit to supine: Min guard   General bed mobility comments: Pt requires min A for tunk elevation when coming to sit from semi-reclined bed this date. Min guard during sit>sup.  Transfers Overall transfer level: Needs assistance Equipment used: Rolling walker (2 wheeled) Transfers: Sit to/from Stand Sit to Stand: Min assist         General transfer comment: cuing for hand placement; multiple attempts required    Balance Overall balance assessment: Needs assistance Sitting-balance support: No upper extremity supported;Feet supported Sitting balance-Leahy Scale: Fair Sitting balance - Comments: Steady static sitting at EOB. Requires min guard to min A for balance with dynamic seated tasks.   Standing balance support: Bilateral upper extremity supported Standing balance-Leahy Scale: Fair Standing balance comment: Requires min A upon initial standing. Min guard during functional mobility.                           ADL either performed or assessed with clinical judgement   ADL Overall ADL's : Needs assistance/impaired Eating/Feeding: NPO;Minimal assistance Eating/Feeding Details (indicate cue type and reason): Continues to require min A for simulated self/feeding. Grooming: Wash/dry face;Oral care;Brushing hair;With adaptive equipment Grooming  Details (indicate cue type and reason): Able to bring RUE to face with significant decreased shoulder flexion. Min A to maximize AROM and support sustained  engagement with RUE. Pt requires consistent cueing to to engage RUE functionally. Completes shaving, oral care, and face washing with assist from therapist. Pt motivated t/o and eager to "get cleaned up". Upper Body Bathing: Moderate assistance;Sitting Upper Body Bathing Details (indicate cue type and reason): Pt requires MOD A to complete hair washing this date. Is able to bring BUE up to head to scrub hair using shampoo cap. Is able to fade to supervision for safety, but fatigues quickly.             Toilet Transfer: Minimal assistance;Ambulation;BSC;RW Toilet Transfer Details (indicate cue type and reason): Min A to perform STS. Close min guard to support balance during functional mobility. Toileting- Water quality scientist and Hygiene: Sit to/from stand;Set up;Minimal assistance;With adaptive equipment;Min guard Toileting - Clothing Manipulation Details (indicate cue type and reason): Min A to come to standing for toilet hygiene. Pt able to stand with RW for peri care given min guard.     Functional mobility during ADLs: Minimal assistance;Rolling walker;Min guard;Cueing for safety       Vision Baseline Vision/History: Wears glasses Wears Glasses: At all times Patient Visual Report: No change from baseline     Perception     Praxis      Cognition Arousal/Alertness: Awake/alert Behavior During Therapy: WFL for tasks assessed/performed Overall Cognitive Status: Within Functional Limits for tasks assessed                                 General Comments: good awareness of deficits, and fair safety awareness, requires min cueing for safety during oral care to support clearing of mouth when using ice chips. Pt eager to participate in therapy.        Exercises Other Exercises Other Exercises: Seated ADL management as described above. OT facilitates seated shaving, oral care, face washing, hair washing (with shampoo cap), hair brushing, and toileting this date. See  ADL section for additional detail regarding occupational performance.   Shoulder Instructions       General Comments      Pertinent Vitals/ Pain       Pain Assessment: No/denies pain  Home Living                                          Prior Functioning/Environment              Frequency  Min 3X/week        Progress Toward Goals  OT Goals(current goals can now be found in the care plan section)  Progress towards OT goals: Progressing toward goals  Acute Rehab OT Goals Patient Stated Goal: get back home to care for my wife OT Goal Formulation: With patient Time For Goal Achievement: 10/23/19 Potential to Achieve Goals: Good  Plan Discharge plan remains appropriate;Frequency remains appropriate    Co-evaluation                 AM-PAC OT "6 Clicks" Daily Activity     Outcome Measure   Help from another person eating meals?: A Little Help from another person taking care of personal grooming?: A Little Help from another person toileting, which includes using toliet, bedpan, or urinal?:  A Little Help from another person bathing (including washing, rinsing, drying)?: A Lot Help from another person to put on and taking off regular upper body clothing?: A Lot Help from another person to put on and taking off regular lower body clothing?: A Lot 6 Click Score: 15    End of Session Equipment Utilized During Treatment: Gait belt;Rolling walker  OT Visit Diagnosis: Other abnormalities of gait and mobility (R26.89);History of falling (Z91.81);Pain Pain - Right/Left: Right Pain - part of body: Hand   Activity Tolerance Patient tolerated treatment well   Patient Left in bed;with bed alarm set;with nursing/sitter in room   Nurse Communication          Time: [id number] OT Time Calculation (min): 55 min  Charges: OT General Charges $OT Visit: 1 Visit OT Treatments $Self Care/Home Management : 53-67 mins  Shara Blazing, M.S.,  OTR/L Ascom: [phone] 10/13/19, 4:31 PM

## 2019-10-14 LAB — GLUCOSE, CAPILLARY
Glucose-Capillary: 171 mg/dL — ABNORMAL HIGH (ref 70–99)
Glucose-Capillary: 87 mg/dL (ref 70–99)
Glucose-Capillary: 159 mg/dL — ABNORMAL HIGH (ref 70–99)
Glucose-Capillary: 83 mg/dL (ref 70–99)

## 2019-10-14 LAB — CBC WITH DIFFERENTIAL/PLATELET
Abs Immature Granulocytes: 0.28 10*3/uL — ABNORMAL HIGH (ref 0.00–0.07)
Basophils Absolute: 0 10*3/uL (ref 0.0–0.1)
Basophils Relative: 0 %
Eosinophils Absolute: 0.1 10*3/uL (ref 0.0–0.5)
Eosinophils Relative: 0 %
HCT: 39.7 % (ref 39.0–52.0)
Hemoglobin: 13.5 g/dL (ref 13.0–17.0)
Immature Granulocytes: 2 %
Lymphocytes Relative: 5 %
Lymphs Abs: 0.9 10*3/uL (ref 0.7–4.0)
MCH: 30.7 pg (ref 26.0–34.0)
MCHC: 34 g/dL (ref 30.0–36.0)
MCV: 90.2 fL (ref 80.0–100.0)
Monocytes Absolute: 1.4 10*3/uL — ABNORMAL HIGH (ref 0.1–1.0)
Monocytes Relative: 9 %
Neutro Abs: 14.1 10*3/uL — ABNORMAL HIGH (ref 1.7–7.7)
Neutrophils Relative %: 84 %
Platelets: 188 10*3/uL (ref 150–400)
RBC: 4.4 MIL/uL (ref 4.22–5.81)
RDW: 13.5 % (ref 11.5–15.5)
WBC: 16.8 10*3/uL — ABNORMAL HIGH (ref 4.0–10.5)
nRBC: 0 % (ref 0.0–0.2)

## 2019-10-14 LAB — BASIC METABOLIC PANEL
Anion gap: 5 (ref 5–15)
BUN: 35 mg/dL — ABNORMAL HIGH (ref 8–23)
CO2: 27 mmol/L (ref 22–32)
Calcium: 7.7 mg/dL — ABNORMAL LOW (ref 8.9–10.3)
Chloride: 110 mmol/L (ref 98–111)
Creatinine, Ser: 1.06 mg/dL (ref 0.61–1.24)
GFR calc Af Amer: >60 mL/min (ref >60)
GFR calc non Af Amer: >60 mL/min (ref >60)
Glucose, Bld: 166 mg/dL — ABNORMAL HIGH (ref 70–99)
Potassium: 3.8 mmol/L (ref 3.5–5.1)
Sodium: 142 mmol/L (ref 135–145)

## 2019-10-14 LAB — CULTURE, BLOOD (ROUTINE X 2)
Culture: NO GROWTH
Culture: NO GROWTH
Special Requests: ADEQUATE
Special Requests: ADEQUATE

## 2019-10-14 LAB — BRAIN NATRIURETIC PEPTIDE: B Natriuretic Peptide: 1273 pg/mL — ABNORMAL HIGH (ref 0.0–100.0)

## 2019-10-14 MED ORDER — FUROSEMIDE 40 MG PO TABS
40.0000 mg | ORAL_TABLET | Freq: Every day | ORAL | Status: DC
  Administered 2019-10-14 – 2019-10-16 (×3): 40 mg
  Filled 2019-10-14 (×3): qty 1

## 2019-10-14 NOTE — Progress Notes (Signed)
Physical Therapy Treatment Patient Details Name: Dustin Harrison MRN: [id number] DOB: [birth year] Today's Date: 10/14/2019    History of Present Illness Pt. is an 86 y.o. male who presented to ER secondary to progressive neck/back pain and R UE weakness after fall 2 days prior to admission; admitted for management of AKI and bacteremia (unknown source at this time).  C-spine, T-spine and L-spine imaging significant for prominent C3-4 and C4-5 impingement, moderate C2-3 and L4-5 impingement and mild C5-6, C7-T1, L3-4 and L5-S1 impingement.  MRI attempted to evaluate for CVA; unable to obtain accurate imaging due to artifact from cochlear implant.  Hospital course additionally significant for PEG placement due to vocal cord paralysis.    PT Comments    Increased lethargy today but he wishes to try.  Stood with mod a x 1 and pt reported feeling dizzy upon standing that did not clear with time.  BP taken in sitting 120/69.  While taking BP, pt with eyes closed and seemed to be falling asleep.  Family stated he had some pain medication earlier and RN who arrived during session confirmed.  Decision make to defer gait at this time for overall safety due to lethargy.   Pt and family agreed.   Follow Up Recommendations  CIR     Equipment Recommendations       Recommendations for Other Services       Precautions / Restrictions Precautions Precautions: Fall Precaution Comments: NPO, PEG Restrictions Weight Bearing Restrictions: No    Mobility  Bed Mobility               General bed mobility comments: in recliner and wishes to remain up  Transfers Overall transfer level: Needs assistance Equipment used: Rolling walker (2 wheeled) Transfers: Sit to/from Stand Sit to Stand: Min assist;Mod assist         General transfer comment: increased assist today.  unsure if due to [place] or lower chair height in recliner.  Ambulation/Gait             General Gait Details: deferred  due to lethargy today.   Stairs             Wheelchair Mobility    Modified Rankin (Stroke Patients Only)       Balance Overall balance assessment: Needs assistance Sitting-balance support: No upper extremity supported;Feet supported Sitting balance-Leahy Scale: Fair     Standing balance support: Bilateral upper extremity supported Standing balance-Leahy Scale: Fair                              Cognition Arousal/Alertness: Lethargic Behavior During Therapy: WFL for tasks assessed/performed Overall Cognitive Status: Within Functional Limits for tasks assessed                                 General Comments: very lethargic today.  family attributes it to medication.  RN in and stated he did have morphine earlier today.      Exercises      General Comments        Pertinent Vitals/Pain Pain Assessment: Faces Faces Pain Scale: Hurts a little bit Pain Location: R hand and behind R ear which he reports as new Pain Descriptors / Indicators: Aching;Grimacing;Guarding Pain Intervention(s): Limited activity within patient's tolerance;Monitored during session    Home Living  Prior Function            PT Goals (current goals can now be found in the care plan section) Progress towards PT goals: Progressing toward goals    Frequency    7X/week      PT Plan Current plan remains appropriate    Co-evaluation              AM-PAC PT "6 Clicks" Mobility   Outcome Measure  Help needed turning from your back to your side while in a flat bed without using bedrails?: A Little Help needed moving from lying on your back to sitting on the side of a flat bed without using bedrails?: A Little Help needed moving to and from a bed to a chair (including a wheelchair)?: A Little Help needed standing up from a chair using your arms (e.g., wheelchair or bedside chair)?: A Little Help needed to walk in hospital  room?: A Little Help needed climbing 3-5 steps with a railing? : A Lot 6 Click Score: 17    End of Session Equipment Utilized During Treatment: Gait belt Activity Tolerance: Patient limited by lethargy Patient left: with call bell/phone within reach;in chair;with chair alarm set;with family/visitor present;with nursing/sitter in room Nurse Communication: Mobility status Hemiplegia - Right/Left: Right Hemiplegia - dominant/non-dominant: Dominant Hemiplegia - caused by: Unspecified     Time: [id number] PT Time Calculation (min) (ACUTE ONLY): 14 min  Charges:  $Therapeutic Activity: 8-22 mins                    Chesley Noon, PTA 10/14/19, 1:17 PM

## 2019-10-14 NOTE — Progress Notes (Signed)
Progress Note  Patient Name: Dustin Harrison Date of Encounter: 10/14/2019  Primary Cardiologist: Ida Rogue, MD   Subjective   Sitting in chair this AM. Notes pain in upper right neck that woke him from sleep--has palpable lump that he endorses as being the pain. No fevers/chills, white count stable. Breathing is good, tolerating medications well.  Inpatient Medications    Scheduled Meds: . carvedilol  6.25 mg Oral BID WC  . Chlorhexidine Gluconate Cloth  6 each Topical Daily  . cloNIDine  0.1 mg Transdermal Weekly  . cyclobenzaprine  5 mg Oral TID  . feeding supplement (JEVITY 1.5 CAL/FIBER)  240 mL Per Tube 6 X Daily  . feeding supplement (PRO-STAT SUGAR FREE 64)  30 mL Per Tube Daily  . finasteride  5 mg Oral Daily  . free water  120 mL Per Tube 6 X Daily  . furosemide  40 mg Per Tube Daily  . insulin aspart  0-15 Units Subcutaneous TID WC  . insulin aspart  4 Units Subcutaneous TID WC  . lidocaine  1 patch Transdermal Q24H  . losartan  100 mg Per Tube Daily  . psyllium  1 packet Oral Daily  . sodium chloride flush  10-40 mL Intracatheter Q12H  . tamsulosin  0.8 mg Oral Daily  . timolol  1 drop Both Eyes BID   Continuous Infusions: . penicillin g continuous IV infusion 12 Million Units (10/14/19 1255)   PRN Meds: HYDROcodone-acetaminophen, labetalol, morphine injection, ondansetron **OR** ondansetron (ZOFRAN) IV, sodium chloride flush   Vital Signs    Vitals:   10/14/19 0830 10/14/19 0831 10/14/19 1232 10/14/19 1250  BP: (!) 175/90 (!) 162/87 134/74 120/69  Pulse: 85 82 66 69  Resp: 16  16   Temp: 98.2 F (36.8 C)  (!) 97.5 F (36.4 C)   TempSrc:   Oral   SpO2: 96% 94% 96%   Weight:      Height:        Intake/Output Summary (Last 24 hours) at 10/14/2019 1430 Last data filed at 10/14/2019 1107 Gross per 24 hour  Intake 0 ml  Output 650 ml  Net -650 ml   Last 3 Weights 10/11/2019 10/07/2019 05/25/2019  Weight (lbs) 192 lb 0.3 oz 192 lb 199 lb  Weight  (kg) 87.1 kg 87.091 kg 90.266 kg      Telemetry   NSR - Personally Reviewed  ECG    5/2 sinus rhythm with PAC - Personally Reviewed  Physical Exam   GEN: Frail appearing elderly gentleman HEENT: Palpable (suspect lymph node) right submandibular NECK: No JVD visible CARDIAC: regular rhythm, normal S1 and S2, no rubs or gallops. No murmur. VASCULAR: Radial pulses 2+ bilaterally.  RESPIRATORY:  Clear to auscultation without rales, wheezing or rhonchi  ABDOMEN: PEG tube in place, receiving tube feeds MUSCULOSKELETAL:  Moves all 4 limbs independently SKIN: Warm and dry, no edema NEUROLOGIC:  Nonfocal, hard of hearing PSYCHIATRIC:  Normal affect   Labs    High Sensitivity Troponin:  No results for input(s): TROPONINIHS in the last 720 hours.    Chemistry Recent Labs  Lab 10/11/19 0445 10/11/19 0445 10/12/19 0509 10/13/19 0411 10/14/19 0324  NA 138   < > 142 144 142  K 4.1   < > 4.4 4.3 3.8  CL 107   < > 109 111 110  CO2 23   < > 25 26 27   GLUCOSE 187*   < > 167* 149* 166*  BUN 54*   < >  47* 38* 35*  CREATININE 1.38*   < > 1.42* 1.21 1.06  CALCIUM 8.0*   < > 7.8* 7.9* 7.7*  PROT 5.8*  --   --   --   --   ALBUMIN 2.5*  --   --   --   --   AST 32  --   --   --   --   ALT 40  --   --   --   --   ALKPHOS 86  --   --   --   --   BILITOT 1.1  --   --   --   --   GFRNONAA 46*   < > 45* 54* >60  GFRAA 54*   < > 52* >60 >60  ANIONGAP 8   < > 8 7 5    < > = values in this interval not displayed.     Hematology Recent Labs  Lab 10/12/19 0509 10/13/19 0411 10/14/19 0324  WBC 13.9* 16.4* 16.8*  RBC 5.20 4.98 4.40  HGB 16.2 15.5 13.5  HCT 48.0 44.8 39.7  MCV 92.3 90.0 90.2  MCH 31.2 31.1 30.7  MCHC 33.8 34.6 34.0  RDW 13.2 13.5 13.5  PLT 120* 159 188    BNP Recent Labs  Lab 10/14/19 0324  BNP 1,273.0*     DDimer No results for input(s): DDIMER in the last 168 hours.   Radiology    CT ABDOMEN PELVIS W CONTRAST  Result Date: 10/13/2019 CLINICAL DATA:   Hypertension, diabetes , chronic low back pain worsening, weakness, bacteremia EXAM: CT ABDOMEN AND PELVIS WITH CONTRAST TECHNIQUE: Multidetector CT imaging of the abdomen and pelvis was performed using the standard protocol following bolus administration of intravenous contrast. CONTRAST:  12mL OMNIPAQUE IOHEXOL 300 MG/ML  SOLN COMPARISON:  None. FINDINGS: Lower chest: Small left pleural effusion and trace right pleural effusion. Subsegmental dependent atelectasis posteriorly in the left lower lobe. Linear scarring or platelike atelectasis medially at the right lung base. Hepatobiliary: Scattered punctate calcified granulomas. Gallbladder physiologically distended. No biliary ductal dilatation. No focal liver lesion. Portal vein patent. Pancreas: Unremarkable. No pancreatic ductal dilatation or surrounding inflammatory changes. Spleen: Normal size. Scattered small calcified granulomas. Adrenals/Urinary Tract: Left adrenal hypertrophy. Marked parenchymal atrophy in the right kidney. Compensatory hypertrophy on the left. No hydronephrosis. Urinary bladder physiologically distended. Stomach/Bowel: Stomach is incompletely distended. Balloon-retention gastrostomy catheter projects in good position. There is fluid-distension of the proximal duodenum, with 2.2 x 3.8 cm intraluminal soft tissue nodular density which may represent mass, conceivably hematoma or ingested material. The remainder of the small bowel is decompressed, unremarkable. Appendix not identified. Residual contrast material and gas in the proximal colon, decompressed distally. Vascular/Lymphatic: Mild calcified aortic plaque without aneurysm or stenosis. No abdominal or pelvic adenopathy. Reproductive: Prostate is unremarkable. Other: No ascites. No free air. Musculoskeletal: Mild multilevel lumbar spondylitic change. Bilateral hip DJD. No acute fracture or worrisome bone lesion. IMPRESSION: 1. 2.2 cm intraluminal nodular density of the proximal  duodenum, may represent mass, ingested material, or possibly hematoma. If clinically appropriate, consider elective outpatient endoscopy for further characterization. 2. Small left and trace right pleural effusions. 3. Right renal parenchymal atrophy with compensatory hypertrophy on the left. 4. Old granulomatous disease. 5. Lumbar and bilateral hip DJD. Aortic Atherosclerosis (ICD10-I70.0). Electronically Signed   By: Lucrezia Europe M.D.   On: 10/13/2019 12:06    Cardiac Studies   TTE 10/08/19 1. Left ventricular ejection fraction, by estimation, is 25 to 30%. The  left ventricle has severely decreased function. The left ventricle has no  regional wall motion abnormalities. There is moderate left ventricular  hypertrophy. Left ventricular  diastolic parameters are consistent with Grade I diastolic dysfunction  (impaired relaxation). There is severe akinesis of the left ventricular,  mid-apical anteroseptal wall, anterolateral wall, anterior segment, apical  segment and inferoseptal wall. Wall  motion abnormalities are suggestive of stress induced cardiomyopathy vs.  LAD infarct.  2. Right ventricular systolic function is normal. The right ventricular  size is normal. There is mildly elevated pulmonary artery systolic  pressure.  3. Left atrial size was mildly dilated.  4. The mitral valve is normal in structure. No evidence of mitral valve  regurgitation. No evidence of mitral stenosis.  5. The aortic valve is normal in structure. Aortic valve regurgitation is  not visualized. Mild to moderate aortic valve sclerosis/calcification is  present, without any evidence of aortic stenosis.  6. The inferior vena cava is normal in size with greater than 50%  respiratory variability, suggesting right atrial pressure of 3 mmHg.  7. No clear vegetations but suboptimal study.   TEE 10/12/19 1. Left ventricular ejection fraction, by estimation, is 25 to 30%. The  left ventricle has severely  decreased function. The left ventricle  demonstrates global hypokinesis.  2. Right ventricular systolic function is normal. The right ventricular  size is normal. Tricuspid regurgitation signal is inadequate for assessing  PA pressure.  3. The mitral valve is normal in structure. No evidence of mitral valve  regurgitation.  4. Aortic valve regurgitation is not visualized. Severe calcification of  aortic valve. Visually, there appears to be moderate aortic valve  stenosis.  5. Grossly, no valve vegetation noted.    Patient Profile     86 y.o. male with PMH hypertension, type II diabetes, pheochromocytoma, presenting with fevers/chills and found to be bacteremic with newly reduced EF 25-30%.  Assessment & Plan    Bacteremia: -group B strep per cultures -TEE without evidence of endocarditis 10/12/19 -awaiting SNF placement, with plans for IV antibiotics  Cardiomyopathy: unclear etiology, no known prior history of systolic heart failure -EF 25-30% -recommend that once he has completed treatment for his bacteremia, he can then undergo outpatient ischemic evaluation -tolerating losartan 100 mg daily per tube -continue carvedilol 6.25 mg BID per tube -on clonidine patch, ideally would like to titrate this off and use goal directed medical therapy in the long term -monitor renal function given his CKD. Ideally would like to transition to entresto as outpatient -net negative 1.7 L this admission, does not have daily weights -appears euvolemic on exam. -receiving lasix 40 mg daily per tube  For questions or updates, please contact [facility] Please consult [email or web address] for contact info under    Signed, Buford Dresser, MD  10/14/2019, 2:30 PM

## 2019-10-14 NOTE — Progress Notes (Addendum)
PROGRESS NOTE    Dustin Harrison  IWP:[id number] DOB: December 15, 1931 DOA: 10/07/2019 PCP: Jinny Sanders, MD   Brief Narrative: Dustin Osment Johnsonis a 86 y.o.malewith medical history significant forhypertension, diabetes mellitus and chronic low back pain who presents to the emergency room for evaluation of worsening low back pain and generalized weakness. Patient had noncontrasted MRI of cervical, thoracic and lumbar spines,showed a significant degenerative disc disease,but no evidence of osteomyelitis. Patient also had positive blood culture with group B streptococcus. Penicillin G is started after consultation withID. 5/6.Patient continued to have significant dysphagia, patient was seen by ENT, patient had a significant right vocal cord paralysis.Feeding tube replaced. 5/7.  TEE was performed, did not show evidence of endocarditis. 5/8. PICC placed, CT abd/pelvis did not see any evidence of infection or abscess.  There is a 2.2 cm intraluminal density in the duodenum, requiring future EGD work-up.   Assessment & Plan:   Principal Problem:   AKI (acute kidney injury) ([place]) Active Problems:   Diabetes mellitus without complication ([place])   Essential hypertension, benign   CKD (chronic kidney disease) stage 2, GFR 60-89 ml/min   Back pain   Leukocytosis   Cardiomyopathy (HCC)   Bacteremia   HFrEF (heart failure with reduced ejection fraction) (HCC)   Malnutrition of moderate degree   Chronic systolic CHF (congestive heart failure) ([place])  #1.  Group B streptococcus bacteremia. TEE ruled out endocarditis, CT abdomen/pelvis did not show any abscess.  PICC line has been placed. At this point, patient will need 4 weeks of IV antibiotics with Rocephin. Social worker is working for placement in nursing home.  #2.  Dysphagia. Secondary to vocal cord paralysis.  Strict n.p.o., continue tube feeding.  3.  Chronic systolic congestive heart failure with ejection fraction 25 to 30%.  Patient currently does not feel short of breath, but he occasionally has some  Tachypnea.  Will check a BNP level.  4.  Acute kidney injury. Renal function normalized.  5.  Thrombocytopenia. Secondary to infection.  Normalized.   6.  Type 2 diabetes. Continue sliding scale insulin.  7.  Essential hypertension. Blood pressure medicine dose increased.  Continue to follow for another day.  8.  Right-sided weakness. Stable.  12:20 BNP 1200, start lasix per G tube.  DVT prophylaxis:SCDs Code Status:Full Family Communication:Called her daughter at home, no response. Disposition Plan:  Patient came from:Home  Anticipated d/c place:ECF  Barriers to d/c OR conditions which need to be met to effect a safe d/c: Patient Rocephin of discharge.  This can be done at home.  Will await decision from physical therapy/Occupational Therapy and the decision from social service.  Consultants:  Neurology  Cardiology  Infectious disease  GI  ENT  Hematology  Procedures:None Antimicrobials:Penicillin G.   Subjective: Patient feels well today.  Tolerating bolus tube feeding without nausea vomiting.  No abdominal pain. He denies any short of breath or cough. No fever or chills.  Objective: Vitals:   10/13/19 2029 10/14/19 0402 10/14/19 0830 10/14/19 0831  BP: (!) 156/75 (!) 160/84 (!) 175/90 (!) 162/87  Pulse: 72 76 85 82  Resp: 18 (!) 24 16   Temp: 97.7 F (36.5 C) 98.4 F (36.9 C) 98.2 F (36.8 C)   TempSrc: Oral Oral    SpO2: 96% 94% 96% 94%  Weight:      Height:        Intake/Output Summary (Last 24 hours) at 10/14/2019 1038 Last data filed at 10/14/2019 0527 Gross per 24  hour  Intake 0 ml  Output 550 ml  Net -550 ml   Filed Weights   10/07/19 0338 10/11/19 1508  Weight: 87.1 kg 87.1 kg    Examination:  General exam: Appears calm and  comfortable  Respiratory system: Clear to auscultation. Respiratory effort normal. Cardiovascular system: S1 & S2 heard, RRR. No JVD, murmurs, rubs, gallops or clicks. No pedal edema. Gastrointestinal system: Abdomen is nondistended, soft and nontender. No organomegaly or masses felt. Normal bowel sounds heard. Central nervous system: Alert and oriented. No focal neurological deficits. Extremities: Symmetric  Skin: No rashes, lesions or ulcers Psychiatry: Judgement and insight appear normal. Mood & affect appropriate.     Data Reviewed: I have personally reviewed following labs and imaging studies  CBC: Recent Labs  Lab 10/10/19 0415 10/11/19 0445 10/12/19 0509 10/13/19 0411 10/14/19 0324  WBC 15.5* 12.7* 13.9* 16.4* 16.8*  NEUTROABS  --  10.3* 11.4* 13.8* 14.1*  HGB 16.7 16.3 16.2 15.5 13.5  HCT 48.2 48.2 48.0 44.8 39.7  MCV 89.3 90.6 92.3 90.0 90.2  PLT 74* 94* 120* 159 626   Basic Metabolic Panel: Recent Labs  Lab 10/09/19 0033 10/11/19 0445 10/12/19 0509 10/13/19 0411 10/14/19 0324  NA 135 138 142 144 142  K 4.3 4.1 4.4 4.3 3.8  CL 105 107 109 111 110  CO2 '24 23 25 26 27  '$ GLUCOSE 164* 187* 167* 149* 166*  BUN 42* 54* 47* 38* 35*  CREATININE 1.22 1.38* 1.42* 1.21 1.06  CALCIUM 8.0* 8.0* 7.8* 7.9* 7.7*  MG  --  2.6* 2.6* 2.6*  --   PHOS  --   --   --  3.2  --    GFR: Estimated Creatinine Clearance: 54.3 mL/min (by C-G formula based on SCr of 1.06 mg/dL). Liver Function Tests: Recent Labs  Lab 10/11/19 0445  AST 32  ALT 40  ALKPHOS 86  BILITOT 1.1  PROT 5.8*  ALBUMIN 2.5*   No results for input(s): LIPASE, AMYLASE in the last 168 hours. No results for input(s): AMMONIA in the last 168 hours. Coagulation Profile: Recent Labs  Lab 10/11/19 0445  INR 1.1   Cardiac Enzymes: No results for input(s): CKTOTAL, CKMB, CKMBINDEX, TROPONINI in the last 168 hours. BNP (last 3 results) No results for input(s): PROBNP in the last 8760 hours. HbA1C: No  results for input(s): HGBA1C in the last 72 hours. CBG: Recent Labs  Lab 10/13/19 0744 10/13/19 1146 10/13/19 1803 10/13/19 2201 10/14/19 0826  GLUCAP 153* 112* 169* 123* 171*   Lipid Profile: No results for input(s): CHOL, HDL, LDLCALC, TRIG, CHOLHDL, LDLDIRECT in the last 72 hours. Thyroid Function Tests: No results for input(s): TSH, T4TOTAL, FREET4, T3FREE, THYROIDAB in the last 72 hours. Anemia Panel: No results for input(s): VITAMINB12, FOLATE, FERRITIN, TIBC, IRON, RETICCTPCT in the last 72 hours. Sepsis Labs: No results for input(s): PROCALCITON, LATICACIDVEN in the last 168 hours.  Recent Results (from the past 240 hour(s))  Urine culture     Status: None   Collection Time: 10/07/19  7:27 AM   Specimen: Urine, Random  Result Value Ref Range Status   Specimen Description   Final    URINE, RANDOM Performed at [facility], [address], [place], [place] [address]    Special Requests   Final    NONE Performed at [facility], [address] [place], [place] [address]    Culture   Final    NO GROWTH Performed at [facility],  [address], [place], [place] [address]    Report Status 10/08/2019 FINAL  Final  Blood Culture (routine x 2)     Status: Abnormal   Collection Time: 10/07/19  8:31 AM   Specimen: BLOOD  Result Value Ref Range Status   Specimen Description   Final    BLOOD LAC Performed at [facility], [address], [place], [place] [address]    Special Requests   Final    BOTTLES DRAWN AEROBIC AND ANAEROBIC Blood Culture adequate volume Performed at [facility], [address] [place], [place] [address]    Culture  Setup Time   Final    GRAM POSITIVE COCCI IN BOTH AEROBIC AND ANAEROBIC BOTTLES CRITICAL RESULT CALLED TO, READ BACK BY AND VERIFIED WITH: ASAJAH DUNCAN ON 10/07/2019 AT 2035 TIK Performed at [facility], [address] [place], [place] [address]    Culture (A)  Final     GROUP B STREP(S.AGALACTIAE)ISOLATED SUSCEPTIBILITIES PERFORMED ON PREVIOUS CULTURE WITHIN THE LAST 5 DAYS. Performed at [facility], [place] [address], [place], [place] [address]    Report Status 10/09/2019 FINAL  Final  Blood Culture (routine x 2)     Status: Abnormal   Collection Time: 10/07/19  8:31 AM   Specimen: BLOOD  Result Value Ref Range Status   Specimen Description   Final    BLOOD LWRIST Performed at [facility], [address] [place], [place] [address]    Special Requests   Final    BOTTLES DRAWN AEROBIC AND ANAEROBIC Blood Culture adequate volume Performed at [facility], [address] [place], [place] [address]    Culture  Setup Time   Final    GRAM POSITIVE COCCI IN BOTH AEROBIC AND ANAEROBIC BOTTLES CRITICAL RESULT CALLED TO, READ BACK BY AND VERIFIED WITH: ASAJAH DUNCAN ON 10/07/2019 AT 2035 TIK Performed at [facility], [place] [address] [place], [place] [address]    Culture GROUP B STREP(S.AGALACTIAE)ISOLATED (A)  Final   Report Status 10/09/2019 FINAL  Final   Organism ID, Bacteria GROUP B STREP(S.AGALACTIAE)ISOLATED  Final      Susceptibility   Group b strep(s.agalactiae)isolated - MIC*    CLINDAMYCIN <=0.25 SENSITIVE Sensitive     AMPICILLIN <=0.25 SENSITIVE Sensitive     ERYTHROMYCIN <=0.12 SENSITIVE Sensitive     VANCOMYCIN 0.5 SENSITIVE Sensitive     CEFTRIAXONE <=0.12 SENSITIVE Sensitive     LEVOFLOXACIN 1 SENSITIVE Sensitive     PENICILLIN Value in next row Sensitive      SENSITIVE0.06    * GROUP B STREP(S.AGALACTIAE)ISOLATED  Blood Culture ID Panel (Reflexed)     Status: Abnormal   Collection Time: 10/07/19  8:31 AM  Result Value Ref Range Status   Enterococcus species NOT DETECTED NOT DETECTED Final   Listeria monocytogenes NOT DETECTED NOT DETECTED Final   Staphylococcus species NOT DETECTED NOT DETECTED Final   Staphylococcus aureus (BCID) NOT DETECTED NOT DETECTED Final   Streptococcus species DETECTED  (A) NOT DETECTED Final    Comment: CRITICAL RESULT CALLED TO, READ BACK BY AND VERIFIED WITH: ASAJAH DUNCAN ON 10/07/2019 AT 2035 TIK    Streptococcus agalactiae DETECTED (A) NOT DETECTED Final    Comment: CRITICAL RESULT CALLED TO, READ BACK BY AND VERIFIED WITH: ASAJAH DUNCAN ON 10/07/2019 AT 2035 TIK    Streptococcus pneumoniae NOT DETECTED NOT DETECTED Final   Streptococcus pyogenes NOT DETECTED NOT DETECTED Final   Acinetobacter baumannii NOT DETECTED NOT DETECTED  Final   Enterobacteriaceae species NOT DETECTED NOT DETECTED Final   Enterobacter cloacae complex NOT DETECTED NOT DETECTED Final   Escherichia coli NOT DETECTED NOT DETECTED Final   Klebsiella oxytoca NOT DETECTED NOT DETECTED Final   Klebsiella pneumoniae NOT DETECTED NOT DETECTED Final   Proteus species NOT DETECTED NOT DETECTED Final   Serratia marcescens NOT DETECTED NOT DETECTED Final   Haemophilus influenzae NOT DETECTED NOT DETECTED Final   Neisseria meningitidis NOT DETECTED NOT DETECTED Final   Pseudomonas aeruginosa NOT DETECTED NOT DETECTED Final   Candida albicans NOT DETECTED NOT DETECTED Final   Candida glabrata NOT DETECTED NOT DETECTED Final   Candida krusei NOT DETECTED NOT DETECTED Final   Candida parapsilosis NOT DETECTED NOT DETECTED Final   Candida tropicalis NOT DETECTED NOT DETECTED Final    Comment: Performed at [facility], [address] [place], [place] [address]  Respiratory Panel by RT PCR (Flu A&B, Covid) - Nasopharyngeal Swab     Status: None   Collection Time: 10/07/19 12:27 PM   Specimen: Nasopharyngeal Swab  Result Value Ref Range Status   SARS Coronavirus 2 by RT PCR NEGATIVE NEGATIVE Final    Comment: (NOTE) SARS-CoV-2 target nucleic acids are NOT DETECTED. The SARS-CoV-2 RNA is generally detectable in upper respiratoy specimens during the acute phase of infection. The lowest concentration of SARS-CoV-2 viral copies this assay can detect is 131 copies/mL. A negative  result does not preclude SARS-Cov-2 infection and should not be used as the sole basis for treatment or other patient management decisions. A negative result may occur with  improper specimen collection/handling, submission of specimen other than nasopharyngeal swab, presence of viral mutation(s) within the areas targeted by this assay, and inadequate number of viral copies (<131 copies/mL). A negative result must be combined with clinical observations, patient history, and epidemiological information. The expected result is Negative. Fact Sheet for Patients:  PinkCheek.be Fact Sheet for Healthcare Providers:  GravelBags.it This test is not yet ap proved or cleared by the [place] FDA and  has been authorized for detection and/or diagnosis of SARS-CoV-2 by FDA under an Emergency Use Authorization (EUA). This EUA will remain  in effect (meaning this test can be used) for the duration of the COVID-19 declaration under Section 564(b)(1) of the Act, 21 U.S.C. section 360bbb-3(b)(1), unless the authorization is terminated or revoked sooner.    Influenza A by PCR NEGATIVE NEGATIVE Final   Influenza B by PCR NEGATIVE NEGATIVE Final    Comment: (NOTE) The Xpert Xpress SARS-CoV-2/FLU/RSV assay is intended as an aid in  the diagnosis of influenza from Nasopharyngeal swab specimens and  should not be used as a sole basis for treatment. Nasal washings and  aspirates are unacceptable for Xpert Xpress SARS-CoV-2/FLU/RSV  testing. Fact Sheet for Patients: PinkCheek.be Fact Sheet for Healthcare Providers: GravelBags.it This test is not yet approved or cleared by the [place] FDA and  has been authorized for detection and/or diagnosis of SARS-CoV-2 by  FDA under an Emergency Use Authorization (EUA). This EUA will remain  in effect (meaning this test can be used) for the  duration of the  Covid-19 declaration under Section 564(b)(1) of the Act, 21  U.S.C. section 360bbb-3(b)(1), unless the authorization is  terminated or revoked. Performed at [facility], [address] [place], [place] [address]   Culture, blood (Routine X 2) w Reflex to ID Panel     Status: None   Collection Time: 10/09/19 12:31 AM   Specimen:  BLOOD LEFT WRIST  Result Value Ref Range Status   Specimen Description BLOOD LEFT WRIST  Final   Special Requests   Final    BOTTLES DRAWN AEROBIC AND ANAEROBIC Blood Culture adequate volume   Culture   Final    NO GROWTH 5 DAYS Performed at [facility], [address] [place], [place] [address]    Report Status 10/14/2019 FINAL  Final  Culture, blood (Routine X 2) w Reflex to ID Panel     Status: None   Collection Time: 10/09/19 12:33 AM   Specimen: BLOOD LEFT HAND  Result Value Ref Range Status   Specimen Description BLOOD LEFT HAND  Final   Special Requests   Final    BOTTLES DRAWN AEROBIC AND ANAEROBIC Blood Culture adequate volume   Culture   Final    NO GROWTH 5 DAYS Performed at [facility], [address] [place], [place] [address]    Report Status 10/14/2019 FINAL  Final         Radiology Studies: CT ABDOMEN PELVIS W CONTRAST  Result Date: 10/13/2019 CLINICAL DATA:  Hypertension, diabetes , chronic low back pain worsening, weakness, bacteremia EXAM: CT ABDOMEN AND PELVIS WITH CONTRAST TECHNIQUE: Multidetector CT imaging of the abdomen and pelvis was performed using the standard protocol following bolus administration of intravenous contrast. CONTRAST:  172m OMNIPAQUE IOHEXOL 300 MG/ML  SOLN COMPARISON:  None. FINDINGS: Lower chest: Small left pleural effusion and trace right pleural effusion. Subsegmental dependent atelectasis posteriorly in the left lower lobe. Linear scarring or platelike atelectasis medially at the right lung base. Hepatobiliary: Scattered punctate calcified granulomas.  Gallbladder physiologically distended. No biliary ductal dilatation. No focal liver lesion. Portal vein patent. Pancreas: Unremarkable. No pancreatic ductal dilatation or surrounding inflammatory changes. Spleen: Normal size. Scattered small calcified granulomas. Adrenals/Urinary Tract: Left adrenal hypertrophy. Marked parenchymal atrophy in the right kidney. Compensatory hypertrophy on the left. No hydronephrosis. Urinary bladder physiologically distended. Stomach/Bowel: Stomach is incompletely distended. Balloon-retention gastrostomy catheter projects in good position. There is fluid-distension of the proximal duodenum, with 2.2 x 3.8 cm intraluminal soft tissue nodular density which may represent mass, conceivably hematoma or ingested material. The remainder of the small bowel is decompressed, unremarkable. Appendix not identified. Residual contrast material and gas in the proximal colon, decompressed distally. Vascular/Lymphatic: Mild calcified aortic plaque without aneurysm or stenosis. No abdominal or pelvic adenopathy. Reproductive: Prostate is unremarkable. Other: No ascites. No free air. Musculoskeletal: Mild multilevel lumbar spondylitic change. Bilateral hip DJD. No acute fracture or worrisome bone lesion. IMPRESSION: 1. 2.2 cm intraluminal nodular density of the proximal duodenum, may represent mass, ingested material, or possibly hematoma. If clinically appropriate, consider elective outpatient endoscopy for further characterization. 2. Small left and trace right pleural effusions. 3. Right renal parenchymal atrophy with compensatory hypertrophy on the left. 4. Old granulomatous disease. 5. Lumbar and bilateral hip DJD. Aortic Atherosclerosis (ICD10-I70.0). Electronically Signed   By: DLucrezia EuropeM.D.   On: 10/13/2019 12:06        Scheduled Meds: . carvedilol  6.25 mg Oral BID WC  . Chlorhexidine Gluconate Cloth  6 each Topical Daily  . cloNIDine  0.1 mg Transdermal Weekly  . cyclobenzaprine   5 mg Oral TID  . feeding supplement (JEVITY 1.5 CAL/FIBER)  240 mL Per Tube 6 X Daily  . feeding supplement (PRO-STAT SUGAR FREE 64)  30 mL Per Tube Daily  . finasteride  5 mg Oral Daily  . free water  120 mL Per Tube 6  X Daily  . insulin aspart  0-15 Units Subcutaneous TID WC  . insulin aspart  4 Units Subcutaneous TID WC  . lidocaine  1 patch Transdermal Q24H  . losartan  100 mg Per Tube Daily  . psyllium  1 packet Oral Daily  . sodium chloride flush  10-40 mL Intracatheter Q12H  . tamsulosin  0.8 mg Oral Daily  . timolol  1 drop Both Eyes BID   Continuous Infusions: . penicillin g continuous IV infusion 12 Million Units (10/14/19 0145)     LOS: 7 days    Time spent: 28 minutes    Sharen Hones, MD [facility]   To contact the attending provider between 7A-7P or the covering provider during after hours 7P-7A, please log into the web site [email or web address] and access using universal [place] password for that web site. If you do not have the password, please call the hospital operator.  10/14/2019, 10:38 AM

## 2019-10-15 LAB — CBC WITH DIFFERENTIAL/PLATELET
Abs Immature Granulocytes: 0.34 10*3/uL — ABNORMAL HIGH (ref 0.00–0.07)
Basophils Absolute: 0 10*3/uL (ref 0.0–0.1)
Basophils Relative: 0 %
Eosinophils Absolute: 0.1 10*3/uL (ref 0.0–0.5)
Eosinophils Relative: 1 %
HCT: 38.8 % — ABNORMAL LOW (ref 39.0–52.0)
Hemoglobin: 13.3 g/dL (ref 13.0–17.0)
Immature Granulocytes: 2 %
Lymphocytes Relative: 6 %
Lymphs Abs: 1.1 10*3/uL (ref 0.7–4.0)
MCH: 30.9 pg (ref 26.0–34.0)
MCHC: 34.3 g/dL (ref 30.0–36.0)
MCV: 90 fL (ref 80.0–100.0)
Monocytes Absolute: 1.4 10*3/uL — ABNORMAL HIGH (ref 0.1–1.0)
Monocytes Relative: 9 %
Neutro Abs: 14 10*3/uL — ABNORMAL HIGH (ref 1.7–7.7)
Neutrophils Relative %: 82 %
Platelets: 215 10*3/uL (ref 150–400)
RBC: 4.31 MIL/uL (ref 4.22–5.81)
RDW: 13.5 % (ref 11.5–15.5)
WBC: 16.9 10*3/uL — ABNORMAL HIGH (ref 4.0–10.5)
nRBC: 0 % (ref 0.0–0.2)

## 2019-10-15 LAB — GLUCOSE, CAPILLARY
Glucose-Capillary: 127 mg/dL — ABNORMAL HIGH (ref 70–99)
Glucose-Capillary: 104 mg/dL — ABNORMAL HIGH (ref 70–99)
Glucose-Capillary: 132 mg/dL — ABNORMAL HIGH (ref 70–99)
Glucose-Capillary: 111 mg/dL — ABNORMAL HIGH (ref 70–99)

## 2019-10-15 LAB — BASIC METABOLIC PANEL
Anion gap: 5 (ref 5–15)
BUN: 34 mg/dL — ABNORMAL HIGH (ref 8–23)
CO2: 27 mmol/L (ref 22–32)
Calcium: 7.5 mg/dL — ABNORMAL LOW (ref 8.9–10.3)
Chloride: 106 mmol/L (ref 98–111)
Creatinine, Ser: 1.12 mg/dL (ref 0.61–1.24)
GFR calc Af Amer: >60 mL/min (ref >60)
GFR calc non Af Amer: 60 mL/min — ABNORMAL LOW (ref >60)
Glucose, Bld: 157 mg/dL — ABNORMAL HIGH (ref 70–99)
Potassium: 4 mmol/L (ref 3.5–5.1)
Sodium: 138 mmol/L (ref 135–145)

## 2019-10-15 LAB — MAGNESIUM: Magnesium: 2.4 mg/dL (ref 1.7–2.4)

## 2019-10-15 LAB — SURGICAL PATHOLOGY

## 2019-10-15 MED ORDER — SODIUM CHLORIDE 0.9 % IV SOLN
2.0000 g | INTRAVENOUS | Status: DC
  Administered 2019-10-16: 2 g via INTRAVENOUS
  Filled 2019-10-15: qty 20
  Filled 2019-10-15: qty 2

## 2019-10-15 MED ORDER — POLYETHYLENE GLYCOL 3350 17 G PO PACK
17.0000 g | PACK | Freq: Every day | ORAL | Status: DC
  Administered 2019-10-15 – 2019-10-16 (×2): 17 g via ORAL
  Filled 2019-10-15 (×2): qty 1

## 2019-10-15 NOTE — Progress Notes (Signed)
PHARMACY CONSULT NOTE FOR:  OUTPATIENT  PARENTERAL ANTIBIOTIC THERAPY (OPAT)  Indication: group B streptococcus bacteremia Regimen: Ceftriaxone 2gm IV q24h End date: 11/05/2019  IV antibiotic discharge orders are pended. To discharging provider:  please sign these orders via discharge navigator,  Select New Orders & click on the button choice - Manage This Unsigned Work.     Thank you for allowing pharmacy to be a part of this patient's care.  Doreene Eland, PharmD, BCPS.   Work Cell: [phone] 10/15/2019 2:28 PM   /

## 2019-10-15 NOTE — Progress Notes (Signed)
Inpatient Rehabilitation-Admissions Coordinator   Was notified by the insurance company that the patient does not have sufficient evidence to support an IP Rehab stay. Per Dr. Roosevelt Locks, there is no grounds for peer to peer as pt medical ready for DC and his needs can be serviced at a lower level of care.   AC has notified pt's daughter who plans to discuss with her father. She is open to SNF placement for her father. TOC team notified as well.  AC will sign off.   Raechel Ache, OTR/L  Rehab Admissions Coordinator  [phone] 10/15/2019 10:33 AM

## 2019-10-15 NOTE — Progress Notes (Signed)
Progress Note  Patient Name: Dustin Harrison Date of Encounter: 10/15/2019  Primary Cardiologist: new to [facility] - consult by End  Subjective   No chest pain, dyspnea, or palpitations. Documented UOP of 300 mL for the past 24 hours with a net - 1.5 L for the admission. No weights. Up sitting in recliner.   Inpatient Medications    Scheduled Meds: . carvedilol  6.25 mg Oral BID WC  . Chlorhexidine Gluconate Cloth  6 each Topical Daily  . cloNIDine  0.1 mg Transdermal Weekly  . cyclobenzaprine  5 mg Oral TID  . feeding supplement (JEVITY 1.5 CAL/FIBER)  240 mL Per Tube 6 X Daily  . feeding supplement (PRO-STAT SUGAR FREE 64)  30 mL Per Tube Daily  . finasteride  5 mg Oral Daily  . free water  120 mL Per Tube 6 X Daily  . furosemide  40 mg Per Tube Daily  . insulin aspart  0-15 Units Subcutaneous TID WC  . insulin aspart  4 Units Subcutaneous TID WC  . lidocaine  1 patch Transdermal Q24H  . losartan  100 mg Per Tube Daily  . polyethylene glycol  17 g Oral Daily  . psyllium  1 packet Oral Daily  . sodium chloride flush  10-40 mL Intracatheter Q12H  . tamsulosin  0.8 mg Oral Daily  . timolol  1 drop Both Eyes BID   Continuous Infusions: . penicillin g continuous IV infusion 12 Million Units (10/15/19 0018)   PRN Meds: HYDROcodone-acetaminophen, labetalol, morphine injection, ondansetron **OR** ondansetron (ZOFRAN) IV, sodium chloride flush   Vital Signs    Vitals:   10/14/19 1250 10/14/19 1747 10/15/19 0453 10/15/19 0916  BP: 120/69 125/67 (!) 165/68 (!) 145/73  Pulse: 69 86 77 89  Resp:   (!) 24   Temp:   98.1 F (36.7 C)   TempSrc:      SpO2:   99%   Weight:      Height:        Intake/Output Summary (Last 24 hours) at 10/15/2019 1113 Last data filed at 10/15/2019 0639 Gross per 24 hour  Intake 1247 ml  Output 200 ml  Net 1047 ml   Filed Weights   10/07/19 0338 10/11/19 1508  Weight: 87.1 kg 87.1 kg    Telemetry    SR with artifact - Personally  Reviewed  ECG    No new tracings - Personally Reviewed  Physical Exam   GEN: No acute distress.   Neck: No JVD. Cardiac: RRR, no murmurs, rubs, or gallops.  Respiratory: Clear to auscultation bilaterally.  GI: Soft, nontender, non-distended.  PEG tube in place.  MS: No edema; No deformity. Neuro:  Alert and oriented x 3; Nonfocal.  Psych: Normal affect.  Labs    Chemistry Recent Labs  Lab 10/11/19 0445 10/12/19 0509 10/13/19 0411 10/14/19 0324 10/15/19 0647  NA 138   < > 144 142 138  K 4.1   < > 4.3 3.8 4.0  CL 107   < > 111 110 106  CO2 23   < > 26 27 27   GLUCOSE 187*   < > 149* 166* 157*  BUN 54*   < > 38* 35* 34*  CREATININE 1.38*   < > 1.21 1.06 1.12  CALCIUM 8.0*   < > 7.9* 7.7* 7.5*  PROT 5.8*  --   --   --   --   ALBUMIN 2.5*  --   --   --   --  AST 32  --   --   --   --   ALT 40  --   --   --   --   ALKPHOS 86  --   --   --   --   BILITOT 1.1  --   --   --   --   GFRNONAA 46*   < > 54* >60 60*  GFRAA 54*   < > >60 >60 >60  ANIONGAP 8   < > 7 5 5    < > = values in this interval not displayed.     Hematology Recent Labs  Lab 10/13/19 0411 10/14/19 0324 10/15/19 0647  WBC 16.4* 16.8* 16.9*  RBC 4.98 4.40 4.31  HGB 15.5 13.5 13.3  HCT 44.8 39.7 38.8*  MCV 90.0 90.2 90.0  MCH 31.1 30.7 30.9  MCHC 34.6 34.0 34.3  RDW 13.5 13.5 13.5  PLT 159 188 215    Cardiac EnzymesNo results for input(s): TROPONINI in the last 168 hours. No results for input(s): TROPIPOC in the last 168 hours.   BNP Recent Labs  Lab 10/14/19 0324  BNP 1,273.0*     DDimer No results for input(s): DDIMER in the last 168 hours.   Radiology    CT ABDOMEN PELVIS W CONTRAST  Result Date: 10/13/2019 IMPRESSION: 1. 2.2 cm intraluminal nodular density of the proximal duodenum, may represent mass, ingested material, or possibly hematoma. If clinically appropriate, consider elective outpatient endoscopy for further characterization. 2. Small left and trace right pleural  effusions. 3. Right renal parenchymal atrophy with compensatory hypertrophy on the left. 4. Old granulomatous disease. 5. Lumbar and bilateral hip DJD. Aortic Atherosclerosis (ICD10-I70.0). Electronically Signed   By: Lucrezia Europe M.D.   On: 10/13/2019 12:06    Cardiac Studies   2D echo 10/08/2019: 1. Left ventricular ejection fraction, by estimation, is 25 to 30%. The  left ventricle has severely decreased function. The left ventricle has no  regional wall motion abnormalities. There is moderate left ventricular  hypertrophy. Left ventricular  diastolic parameters are consistent with Grade I diastolic dysfunction  (impaired relaxation). There is severe akinesis of the left ventricular,  mid-apical anteroseptal wall, anterolateral wall, anterior segment, apical  segment and inferoseptal wall. Wall  motion abnormalities are suggestive of stress induced cardiomyopathy vs.  LAD infarct.  2. Right ventricular systolic function is normal. The right ventricular  size is normal. There is mildly elevated pulmonary artery systolic  pressure.  3. Left atrial size was mildly dilated.  4. The mitral valve is normal in structure. No evidence of mitral valve  regurgitation. No evidence of mitral stenosis.  5. The aortic valve is normal in structure. Aortic valve regurgitation is  not visualized. Mild to moderate aortic valve sclerosis/calcification is  present, without any evidence of aortic stenosis.  6. The inferior vena cava is normal in size with greater than 50%  respiratory variability, suggesting right atrial pressure of 3 mmHg.  7. No clear vegetations but suboptimal study. __________  TEE 10/12/2019: 1. Left ventricular ejection fraction, by estimation, is 25 to 30%. The  left ventricle has severely decreased function. The left ventricle  demonstrates global hypokinesis.  2. Right ventricular systolic function is normal. The right ventricular  size is normal. Tricuspid regurgitation  signal is inadequate for assessing  PA pressure.  3. The mitral valve is normal in structure. No evidence of mitral valve  regurgitation.  4. Aortic valve regurgitation is not visualized. Severe calcification of  aortic  valve. Visually, there appears to be moderate aortic valve  stenosis.  5. Grossly, no valve vegetation noted.  Patient Profile     86 y.o. male with history of DM2, pheochromocytoma and HTN admitted with bacteremia and found to have acute systolic CHF.   Assessment & Plan    1. Acute HFrEF: -Of uncertain etiology  -Following improvement from his acute illness, he will need to undergo ischemic evaluation to evaluate his cardiomyopathy  -Continue losartan, Coreg, and Lasix -Recommend tapering off clonidine patch with further escalation of GDMT -In follow up, consider adding spirolactone  -CHF education   2. GBS bacteremia: -TEE without evidence of vegetation on 10/12/2019 -IV antibiotics per ID/IM  3. AKI: -Improved  -Consider addition of spironolactone in follow up  For questions or updates, please contact [place] Please consult [email or web address] for contact info under Cardiology/STEMI.    Signed, Christell Faith, PA-C [place] Pager: [phone] 10/15/2019, 11:13 AM

## 2019-10-15 NOTE — Progress Notes (Signed)
Occupational Therapy Treatment Patient Details Name: Dustin Harrison MRN: [id number] DOB: [birth year] Today's Date: 10/15/2019    History of present illness Pt. is an 86 y.o. male who presented to ER secondary to progressive neck/back pain and R UE weakness after fall 2 days prior to admission; admitted for management of AKI and bacteremia (unknown source at this time).  C-spine, T-spine and L-spine imaging significant for prominent C3-4 and C4-5 impingement, moderate C2-3 and L4-5 impingement and mild C5-6, C7-T1, L3-4 and L5-S1 impingement.  MRI attempted to evaluate for CVA; unable to obtain accurate imaging due to artifact from cochlear implant.  Hospital course additionally significant for PEG placement due to vocal cord paralysis.   OT comments  Pt presents to OT this morning sitting up in recliner and self feeding crushed ice with setup assist. He demonstrated improved use of RUE and OT facilitated AAROM of RUE to reach the back of his head to simulate washing/brushing hair. Pt requested to get up and walk around the room; BP measurement recorded at initial sitting was 125/67. He stood with Min A using a RW and upon standing began showing symptoms of significant blood pressure changes including sweatiness, feeling dizzy and hot, and changes in facial coloring. Following measurements were inconsistent throughout the session from 71/54 to 171/75, RN notified. Pt did successfully stand and weight shift using RW x45min while managing BP changes with verbal cues and CGA. Educated pt on monitoring and managing BP changes during functional transfers and ADL participation, including strategies to reduce safety risks. Pt will continue to benefit from skilled OT services during hospitalization to maximize participation in ADL/IADL and to minimize safety and fall risks. Continue to recommend CIR upon discharge as most appropriate for pt's successful return to PLOF.    Follow Up Recommendations  CIR     Equipment Recommendations  Other (comment)(reacher)    Recommendations for Other Services      Precautions / Restrictions Precautions Precautions: Fall Precaution Comments: NPO, PEG Restrictions Weight Bearing Restrictions: No Other Position/Activity Restrictions: monitor BP and HR with standing and movement       Mobility Bed Mobility                  Transfers Overall transfer level: Needs assistance Equipment used: Rolling walker (2 wheeled) Transfers: Sit to/from Stand Sit to Stand: Min assist         General transfer comment: Pt "plopped" down to sit back in recliner from standing, educted on using leg muscles and reaching behind himself for safety    Balance Overall balance assessment: Needs assistance Sitting-balance support: No upper extremity supported;Feet supported Sitting balance-Leahy Scale: Good Sitting balance - Comments: Pt maintained steady sitting balance in bedside chair both during static and dynamic activities (self feeding)   Standing balance support: Bilateral upper extremity supported Standing balance-Leahy Scale: Fair Standing balance comment: Requires Min A upon initial standing and Min guard during functional mobility                           ADL either performed or assessed with clinical judgement   ADL Overall ADL's : Needs assistance/impaired Eating/Feeding: NPO;Set up Eating/Feeding Details (indicate cue type and reason): Still NPO, however self-fed crushed ice using RUE with setup Grooming: Wash/dry face;Brushing hair Grooming Details (indicate cue type and reason): Able to bring RUE to face to wash face independently with decreased ROM in shoulder flexion. AROM in LUE and AAROM provided for  RUE to reach back of head simulating washing/brushing hair.                       Toileting - Clothing Manipulation Details (indicate cue type and reason): CGA for standing balance and assist for urinal management  while patient used R hand for clothing management and stability on RW.     Functional mobility during ADLs: Minimal assistance;Rolling walker;Min guard;Cueing for safety       Vision Baseline Vision/History: Wears glasses Wears Glasses: At all times Patient Visual Report: No change from baseline Vision Assessment?: No apparent visual deficits   Perception     Praxis      Cognition Arousal/Alertness: Awake/alert Behavior During Therapy: WFL for tasks assessed/performed Overall Cognitive Status: Within Functional Limits for tasks assessed                                          Exercises Other Exercises Other Exercises: Sit/stand from recliner using RW while managing and monitoring BP and HR. Functional transfer training for ADL participation. Educated on monitoring signs of BP changes during activity.   Shoulder Instructions       General Comments RUE edema noted (particulary R wrist/hand), MD aware; Pt BP sitting at start of session was 125/97, upon initial stand diaphoretic and dizzy with BP at 71/54. Pt symptoms included sweatiness, hiccups, and feeling hot; symptoms resolved with inconsistent readings of 171/75 to 127/115. Pt left sitting in chair with BP at 143/73, RN notified.    Pertinent Vitals/ Pain       Pain Assessment: 0-10 Pain Score: 4  Pain Location: R wrist/hand are in continuous pain Pain Descriptors / Indicators: Aching;Grimacing;Guarding;Discomfort Pain Intervention(s): Limited activity within patient's tolerance;Monitored during session  Home Living                                          Prior Functioning/Environment              Frequency  Min 3X/week        Progress Toward Goals  OT Goals(current goals can now be found in the care plan section)  Progress towards OT goals: Progressing toward goals  Acute Rehab OT Goals Patient Stated Goal: get back home to care for my wife OT Goal Formulation:  With patient Time For Goal Achievement: 10/23/19 Potential to Achieve Goals: Good  Plan Discharge plan remains appropriate;Frequency remains appropriate    Co-evaluation                 AM-PAC OT "6 Clicks" Daily Activity     Outcome Measure   Help from another person eating meals?: None Help from another person taking care of personal grooming?: A Little Help from another person toileting, which includes using toliet, bedpan, or urinal?: A Little Help from another person bathing (including washing, rinsing, drying)?: A Lot Help from another person to put on and taking off regular upper body clothing?: A Little Help from another person to put on and taking off regular lower body clothing?: A Lot 6 Click Score: 17    End of Session Equipment Utilized During Treatment: Gait belt;Rolling walker  OT Visit Diagnosis: Other abnormalities of gait and mobility (R26.89);History of falling (Z91.81);Pain Pain - Right/Left: Right Pain - part  of body: Hand   Activity Tolerance Treatment limited secondary to medical complications (Comment)(BP difficulties, RN notified)   Patient Left with call bell/phone within reach;in chair;with chair alarm set   Nurse Communication Other (comment)(BP changes and urine output)        Time: [id number] OT Time Calculation (min): 53 min  Charges:    Jerilynn Birkenhead, OTS 10/15/19, 12:03 PM

## 2019-10-15 NOTE — Progress Notes (Signed)
PROGRESS NOTE    Dustin Harrison  UTM:[id number] DOB: [birth year] DOA: 10/07/2019 PCP: Jinny Sanders, MD   Brief Narrative:  Dustin Plain Johnsonis a 86 y.o.malewith medical history significant forhypertension, diabetes mellitus and chronic low back pain who presents to the emergency room for evaluation of worsening low back pain and generalized weakness. Patient had noncontrasted MRI of cervical, thoracic and lumbar spines,showed a significant degenerative disc disease,but no evidence of osteomyelitis. Patient also had positive blood culture with group B streptococcus. Penicillin G is started after consultation withID. 5/6.Patient continued to have significant dysphagia, patient was seen by ENT, patient had a significant right vocal cord paralysis.Feeding tube replaced. 5/7.TEE was performed, did not show evidence of endocarditis. 5/8. PICC placed, CT abd/pelvis did not see any evidence of infection or abscess.  There is a 2.2 cm intraluminal density in the duodenum, requiring future EGD work-up.    Assessment & Plan:   Principal Problem:   AKI (acute kidney injury) ([place]) Active Problems:   Diabetes mellitus without complication ([place])   Essential hypertension, benign   CKD (chronic kidney disease) stage 2, GFR 60-89 ml/min   Back pain   Leukocytosis   Cardiomyopathy (H[facility])   Bacteremia   HFrEF (heart failure with reduced ejection fraction) (HCC)   Malnutrition of moderate degree   Chronic systolic CHF (congestive heart failure) ([place])  #1.  Group B streptococcus bacteremia. Unable to identify source of infection, negative TEE, negative CT abdomen/pelvis. Continue penicillin G, plan to treat with Rocephin for 4 weeks.  #2.  Dysphagia. Secondary to vocal cord paralysis.  Continue tube feeding.  3.  Chronic systolic congestive heart failure with ejection fraction 25 to 30%. Patient has some tachypnea with exertion, BNP elevated.  Patient has mild volume overload,  continue oral Lasix added yesterday.  4.  Acute kidney injury. Renal function has improved.  5.  Thrombocytopenia. Resolved.  6.  Type 2 diabetes. Continue sliding scale insulin.  7.  Essential hypertension. Continue current treatment.  8.  Right-sided weakness. Stable.  Patient has been denied for acute rehab facility.  We will looking for nursing placement.    DVT prophylaxis:SCDs Code Status:Full Family Communication:None at bedside Disposition Plan:  Patient came from:Home  Anticipated d/c place:ECF  Barriers to d/c OR conditions which need to be met to effect a safe d/c: Patient Rocephin of discharge. This can be doneat home. Will await decision from physical therapy/Occupational Therapy and the decision from social service.  Consultants:  Neurology  Cardiology  Infectious disease  GI  ENT  Hematology  Procedures:None Antimicrobials:Penicillin G.     Subjective: Patient is doing well today.  Was able to walk with the physical therapy for 200 feet.  Has some tachypnea afterwards.  No cough. Tolerating tube feeding well, no abdominal pain or nausea vomiting. Do not feel short of breath. No diarrhea or abdominal pain.  Objective: Vitals:   10/14/19 1250 10/14/19 1747 10/15/19 0453 10/15/19 0916  BP: 120/69 125/67 (!) 165/68 (!) 145/73  Pulse: 69 86 77 89  Resp:   (!) 24   Temp:   98.1 F (36.7 C)   TempSrc:      SpO2:   99%   Weight:      Height:        Intake/Output Summary (Last 24 hours) at 10/15/2019 1159 Last data filed at 10/15/2019 0639 Gross per 24 hour  Intake 1247 ml  Output 200 ml  Net 1047 ml   Filed Weights   10/07/19  7342 10/11/19 1508  Weight: 87.1 kg 87.1 kg    Examination:  General exam: Appears calm and comfortable  Respiratory system: Clear to auscultation. Respiratory effort  normal. Cardiovascular system: S1 & S2 heard, RRR. No JVD, murmurs, rubs, gallops or clicks. Trace pedal edema. Gastrointestinal system: Abdomen is nondistended, soft and nontender. No organomegaly or masses felt. Normal bowel sounds heard. Central nervous system: Alert and oriented. No focal neurological deficits. Extremities: Symmetric 5 x 5 power. Skin: No rashes, lesions or ulcers Psychiatry: Judgement and insight appear normal. Mood & affect appropriate.     Data Reviewed: I have personally reviewed following labs and imaging studies  CBC: Recent Labs  Lab 10/11/19 0445 10/12/19 0509 10/13/19 0411 10/14/19 0324 10/15/19 0647  WBC 12.7* 13.9* 16.4* 16.8* 16.9*  NEUTROABS 10.3* 11.4* 13.8* 14.1* 14.0*  HGB 16.3 16.2 15.5 13.5 13.3  HCT 48.2 48.0 44.8 39.7 38.8*  MCV 90.6 92.3 90.0 90.2 90.0  PLT 94* 120* 159 188 876   Basic Metabolic Panel: Recent Labs  Lab 10/11/19 0445 10/12/19 0509 10/13/19 0411 10/14/19 0324 10/15/19 0647  NA 138 142 144 142 138  K 4.1 4.4 4.3 3.8 4.0  CL 107 109 111 110 106  CO2 '23 25 26 27 27  '$ GLUCOSE 187* 167* 149* 166* 157*  BUN 54* 47* 38* 35* 34*  CREATININE 1.38* 1.42* 1.21 1.06 1.12  CALCIUM 8.0* 7.8* 7.9* 7.7* 7.5*  MG 2.6* 2.6* 2.6*  --  2.4  PHOS  --   --  3.2  --   --    GFR: Estimated Creatinine Clearance: 51.4 mL/min (by C-G formula based on SCr of 1.12 mg/dL). Liver Function Tests: Recent Labs  Lab 10/11/19 0445  AST 32  ALT 40  ALKPHOS 86  BILITOT 1.1  PROT 5.8*  ALBUMIN 2.5*   No results for input(s): LIPASE, AMYLASE in the last 168 hours. No results for input(s): AMMONIA in the last 168 hours. Coagulation Profile: Recent Labs  Lab 10/11/19 0445  INR 1.1   Cardiac Enzymes: No results for input(s): CKTOTAL, CKMB, CKMBINDEX, TROPONINI in the last 168 hours. BNP (last 3 results) No results for input(s): PROBNP in the last 8760 hours. HbA1C: No results for input(s): HGBA1C in the last 72 hours. CBG: Recent  Labs  Lab 10/14/19 0826 10/14/19 1304 10/14/19 1655 10/14/19 2155 10/15/19 0745  GLUCAP 171* 87 159* 83 127*   Lipid Profile: No results for input(s): CHOL, HDL, LDLCALC, TRIG, CHOLHDL, LDLDIRECT in the last 72 hours. Thyroid Function Tests: No results for input(s): TSH, T4TOTAL, FREET4, T3FREE, THYROIDAB in the last 72 hours. Anemia Panel: No results for input(s): VITAMINB12, FOLATE, FERRITIN, TIBC, IRON, RETICCTPCT in the last 72 hours. Sepsis Labs: No results for input(s): PROCALCITON, LATICACIDVEN in the last 168 hours.  Recent Results (from the past 240 hour(s))  Urine culture     Status: None   Collection Time: 10/07/19  7:27 AM   Specimen: Urine, Random  Result Value Ref Range Status   Specimen Description   Final    URINE, RANDOM Performed at [facility], [address] [place], [place] [address]    Special Requests   Final    NONE Performed at [facility], [address] [place], [place] [address]    Culture   Final    NO GROWTH Performed at [facility], [place] [address], [place],  [address]    Report Status 10/08/2019 FINAL  Final  Blood Culture (routine x  2)     Status: Abnormal   Collection Time: 10/07/19  8:31 AM   Specimen: BLOOD  Result Value Ref Range Status   Specimen Description   Final    BLOOD LAC Performed at [facility], [address], [place], [place] [address]    Special Requests   Final    BOTTLES DRAWN AEROBIC AND ANAEROBIC Blood Culture adequate volume Performed at [facility], [address] [place], [place] [address]    Culture  Setup Time   Final    GRAM POSITIVE COCCI IN BOTH AEROBIC AND ANAEROBIC BOTTLES CRITICAL RESULT CALLED TO, READ BACK BY AND VERIFIED WITH: ASAJAH DUNCAN ON 10/07/2019 AT 2035 TIK Performed at [facility], [address] [place], [place] [address]    Culture (A)  Final    GROUP B STREP(S.AGALACTIAE)ISOLATED SUSCEPTIBILITIES PERFORMED ON  PREVIOUS CULTURE WITHIN THE LAST 5 DAYS. Performed at [facility], [place] [address] [place], [place] [address]    Report Status 10/09/2019 FINAL  Final  Blood Culture (routine x 2)     Status: Abnormal   Collection Time: 10/07/19  8:31 AM   Specimen: BLOOD  Result Value Ref Range Status   Specimen Description   Final    BLOOD LWRIST Performed at [facility], [address], [place], [place] [address]    Special Requests   Final    BOTTLES DRAWN AEROBIC AND ANAEROBIC Blood Culture adequate volume Performed at [facility], [address] [place], [place] [address]    Culture  Setup Time   Final    GRAM POSITIVE COCCI IN BOTH AEROBIC AND ANAEROBIC BOTTLES CRITICAL RESULT CALLED TO, READ BACK BY AND VERIFIED WITH: ASAJAH DUNCAN ON 10/07/2019 AT 2035 TIK Performed at [facility], [place] [address], [place], [place] [address]    Culture GROUP B STREP(S.AGALACTIAE)ISOLATED (A)  Final   Report Status 10/09/2019 FINAL  Final   Organism ID, Bacteria GROUP B STREP(S.AGALACTIAE)ISOLATED  Final      Susceptibility   Group b strep(s.agalactiae)isolated - MIC*    CLINDAMYCIN <=0.25 SENSITIVE Sensitive     AMPICILLIN <=0.25 SENSITIVE Sensitive     ERYTHROMYCIN <=0.12 SENSITIVE Sensitive     VANCOMYCIN 0.5 SENSITIVE Sensitive     CEFTRIAXONE <=0.12 SENSITIVE Sensitive     LEVOFLOXACIN 1 SENSITIVE Sensitive     PENICILLIN Value in next row Sensitive      SENSITIVE0.06    * GROUP B STREP(S.AGALACTIAE)ISOLATED  Blood Culture ID Panel (Reflexed)     Status: Abnormal   Collection Time: 10/07/19  8:31 AM  Result Value Ref Range Status   Enterococcus species NOT DETECTED NOT DETECTED Final   Listeria monocytogenes NOT DETECTED NOT DETECTED Final   Staphylococcus species NOT DETECTED NOT DETECTED Final   Staphylococcus aureus (BCID) NOT DETECTED NOT DETECTED Final   Streptococcus species DETECTED (A) NOT DETECTED Final    Comment: CRITICAL RESULT CALLED TO, READ  BACK BY AND VERIFIED WITH: ASAJAH DUNCAN ON 10/07/2019 AT 2035 TIK    Streptococcus agalactiae DETECTED (A) NOT DETECTED Final    Comment: CRITICAL RESULT CALLED TO, READ BACK BY AND VERIFIED WITH: ASAJAH DUNCAN ON 10/07/2019 AT 2035 TIK    Streptococcus pneumoniae NOT DETECTED NOT DETECTED Final   Streptococcus pyogenes NOT DETECTED NOT DETECTED Final   Acinetobacter baumannii NOT DETECTED NOT DETECTED Final   Enterobacteriaceae species NOT DETECTED NOT DETECTED Final   Enterobacter cloacae complex NOT DETECTED NOT DETECTED Final  Escherichia coli NOT DETECTED NOT DETECTED Final   Klebsiella oxytoca NOT DETECTED NOT DETECTED Final   Klebsiella pneumoniae NOT DETECTED NOT DETECTED Final   Proteus species NOT DETECTED NOT DETECTED Final   Serratia marcescens NOT DETECTED NOT DETECTED Final   Haemophilus influenzae NOT DETECTED NOT DETECTED Final   Neisseria meningitidis NOT DETECTED NOT DETECTED Final   Pseudomonas aeruginosa NOT DETECTED NOT DETECTED Final   Candida albicans NOT DETECTED NOT DETECTED Final   Candida glabrata NOT DETECTED NOT DETECTED Final   Candida krusei NOT DETECTED NOT DETECTED Final   Candida parapsilosis NOT DETECTED NOT DETECTED Final   Candida tropicalis NOT DETECTED NOT DETECTED Final    Comment: Performed at [facility], [address] [place], [place] [address]  Respiratory Panel by RT PCR (Flu A&B, Covid) - Nasopharyngeal Swab     Status: None   Collection Time: 10/07/19 12:27 PM   Specimen: Nasopharyngeal Swab  Result Value Ref Range Status   SARS Coronavirus 2 by RT PCR NEGATIVE NEGATIVE Final    Comment: (NOTE) SARS-CoV-2 target nucleic acids are NOT DETECTED. The SARS-CoV-2 RNA is generally detectable in upper respiratoy specimens during the acute phase of infection. The lowest concentration of SARS-CoV-2 viral copies this assay can detect is 131 copies/mL. A negative result does not preclude SARS-Cov-2 infection and should not be  used as the sole basis for treatment or other patient management decisions. A negative result may occur with  improper specimen collection/handling, submission of specimen other than nasopharyngeal swab, presence of viral mutation(s) within the areas targeted by this assay, and inadequate number of viral copies (<131 copies/mL). A negative result must be combined with clinical observations, patient history, and epidemiological information. The expected result is Negative. Fact Sheet for Patients:  PinkCheek.be Fact Sheet for Healthcare Providers:  GravelBags.it This test is not yet ap proved or cleared by the [place] FDA and  has been authorized for detection and/or diagnosis of SARS-CoV-2 by FDA under an Emergency Use Authorization (EUA). This EUA will remain  in effect (meaning this test can be used) for the duration of the COVID-19 declaration under Section 564(b)(1) of the Act, 21 U.S.C. section 360bbb-3(b)(1), unless the authorization is terminated or revoked sooner.    Influenza A by PCR NEGATIVE NEGATIVE Final   Influenza B by PCR NEGATIVE NEGATIVE Final    Comment: (NOTE) The Xpert Xpress SARS-CoV-2/FLU/RSV assay is intended as an aid in  the diagnosis of influenza from Nasopharyngeal swab specimens and  should not be used as a sole basis for treatment. Nasal washings and  aspirates are unacceptable for Xpert Xpress SARS-CoV-2/FLU/RSV  testing. Fact Sheet for Patients: PinkCheek.be Fact Sheet for Healthcare Providers: GravelBags.it This test is not yet approved or cleared by the [place] FDA and  has been authorized for detection and/or diagnosis of SARS-CoV-2 by  FDA under an Emergency Use Authorization (EUA). This EUA will remain  in effect (meaning this test can be used) for the duration of the  Covid-19 declaration under Section 564(b)(1) of the  Act, 21  U.S.C. section 360bbb-3(b)(1), unless the authorization is  terminated or revoked. Performed at [facility], [address] [place], [place] [address]   Culture, blood (Routine X 2) w Reflex to ID Panel     Status: None   Collection Time: 10/09/19 12:31 AM   Specimen: BLOOD LEFT WRIST  Result Value Ref Range Status   Specimen Description BLOOD LEFT WRIST  Final   Special Requests  Final    BOTTLES DRAWN AEROBIC AND ANAEROBIC Blood Culture adequate volume   Culture   Final    NO GROWTH 5 DAYS Performed at [facility], [address] [place], [place] [address]    Report Status 10/14/2019 FINAL  Final  Culture, blood (Routine X 2) w Reflex to ID Panel     Status: None   Collection Time: 10/09/19 12:33 AM   Specimen: BLOOD LEFT HAND  Result Value Ref Range Status   Specimen Description BLOOD LEFT HAND  Final   Special Requests   Final    BOTTLES DRAWN AEROBIC AND ANAEROBIC Blood Culture adequate volume   Culture   Final    NO GROWTH 5 DAYS Performed at [facility], [address], [place], [place] [address]    Report Status 10/14/2019 FINAL  Final         Radiology Studies: No results found.      Scheduled Meds: . carvedilol  6.25 mg Oral BID WC  . Chlorhexidine Gluconate Cloth  6 each Topical Daily  . cloNIDine  0.1 mg Transdermal Weekly  . cyclobenzaprine  5 mg Oral TID  . feeding supplement (JEVITY 1.5 CAL/FIBER)  240 mL Per Tube 6 X Daily  . feeding supplement (PRO-STAT SUGAR FREE 64)  30 mL Per Tube Daily  . finasteride  5 mg Oral Daily  . free water  120 mL Per Tube 6 X Daily  . furosemide  40 mg Per Tube Daily  . insulin aspart  0-15 Units Subcutaneous TID WC  . insulin aspart  4 Units Subcutaneous TID WC  . lidocaine  1 patch Transdermal Q24H  . losartan  100 mg Per Tube Daily  . polyethylene glycol  17 g Oral Daily  . psyllium  1 packet Oral Daily  . sodium chloride flush  10-40 mL Intracatheter Q12H  .  tamsulosin  0.8 mg Oral Daily  . timolol  1 drop Both Eyes BID   Continuous Infusions: . penicillin g continuous IV infusion 12 Million Units (10/15/19 0018)     LOS: 8 days    Time spent: 25 minutes    Sharen Hones, MD [facility]   To contact the attending provider between 7A-7P or the covering provider during after hours 7P-7A, please log into the web site [email or web address] and access using universal [place] password for that web site. If you do not have the password, please call the hospital operator.  10/15/2019, 11:59 AM

## 2019-10-15 NOTE — Progress Notes (Signed)
Physical Therapy Treatment Patient Details Name: Dustin Harrison MRN: [id number] DOB: 30-Sep-1931 Today's Date: 10/15/2019    History of Present Illness Pt. is an 86 y.o. male who presented to ER secondary to progressive neck/back pain and R UE weakness after fall 2 days prior to admission; admitted for management of AKI and bacteremia (unknown source at this time).  C-spine, T-spine and L-spine imaging significant for prominent C3-4 and C4-5 impingement, moderate C2-3 and L4-5 impingement and mild C5-6, C7-T1, L3-4 and L5-S1 impingement.  MRI attempted to evaluate for CVA; unable to obtain accurate imaging due to artifact from cochlear implant.  Hospital course additionally significant for PEG placement due to vocal cord paralysis.    PT Comments    Pt pleasant and motivated to participate during the session.  Pt required min A to stand along with multi-modal cues for hand placement.  Pt presented with fair stability during amb with a RW ambulating with a slow, cautious cadence with two standing rest breaks during a 125' walk.  Pt reported no adverse symptoms during the session other than fatigue with SpO2 and HR WNL.  Pt with fair stability during dynamic standing balance with reaching outside BOS.  Pt will benefit from PT services in a SNF setting upon discharge to safely address deficits listed in patient problem list for decreased caregiver assistance and eventual return to PLOF.      Follow Up Recommendations  SNF     Equipment Recommendations  Other (comment)(TBD at next venue of care)    Recommendations for Other Services       Precautions / Restrictions Precautions Precautions: Fall Precaution Comments: NPO, PEG Restrictions Weight Bearing Restrictions: No Other Position/Activity Restrictions: monitor BP and HR with standing and movement    Mobility  Bed Mobility               General bed mobility comments: in recliner and wishes to remain up  Transfers Overall  transfer level: Needs assistance Equipment used: Rolling walker (2 wheeled) Transfers: Sit to/from Stand Sit to Stand: Min assist         General transfer comment: Mod verbal and tactile cues for hand placement  Ambulation/Gait Ambulation/Gait assistance: Min guard Gait Distance (Feet): 125 Feet Assistive device: Rolling walker (2 wheeled) Gait Pattern/deviations: Step-through pattern     General Gait Details: Slow cadence with short B step length but steady without LOB; two standing rest breaks during amb without adverse symptoms and with SpO2 and HR WNL   Stairs             Wheelchair Mobility    Modified Rankin (Stroke Patients Only)       Balance Overall balance assessment: Needs assistance Sitting-balance support: No upper extremity supported;Feet supported Sitting balance-Leahy Scale: Good     Standing balance support: Bilateral upper extremity supported;During functional activity Standing balance-Leahy Scale: Fair Standing balance comment: Min to mod lean on the RW for support                            Cognition Arousal/Alertness: Awake/alert Behavior During Therapy: WFL for tasks assessed/performed Overall Cognitive Status: Within Functional Limits for tasks assessed                                        Exercises Other Exercises Other Exercises: Dynamic standing balance training with reaching  outside BOS with feet apart, together, and semi-tandem    General Comments        Pertinent Vitals/Pain Pain Assessment: No/denies pain    Home Living                      Prior Function            PT Goals (current goals can now be found in the care plan section) Progress towards PT goals: Progressing toward goals    Frequency    7X/week      PT Plan Current plan remains appropriate    Co-evaluation              AM-PAC PT "6 Clicks" Mobility   Outcome Measure  Help needed turning from  your back to your side while in a flat bed without using bedrails?: A Little Help needed moving from lying on your back to sitting on the side of a flat bed without using bedrails?: A Little Help needed moving to and from a bed to a chair (including a wheelchair)?: A Little Help needed standing up from a chair using your arms (e.g., wheelchair or bedside chair)?: A Little Help needed to walk in hospital room?: A Little Help needed climbing 3-5 steps with a railing? : A Lot 6 Click Score: 17    End of Session Equipment Utilized During Treatment: Gait belt Activity Tolerance: Patient tolerated treatment well Patient left: with call bell/phone within reach;in chair;with chair alarm set;with family/visitor present Nurse Communication: Mobility status PT Visit Diagnosis: Muscle weakness (generalized) (M62.81);Difficulty in walking, not elsewhere classified (R26.2);Hemiplegia and hemiparesis Hemiplegia - Right/Left: Right Hemiplegia - dominant/non-dominant: Dominant Hemiplegia - caused by: Unspecified     Time: 1525-1550 PT Time Calculation (min) (ACUTE ONLY): 25 min  Charges:  $Gait Training: 8-22 mins $Therapeutic Exercise: 8-22 mins                     D. Scott Johnnie Moten PT, DPT 10/15/19, 4:13 PM

## 2019-10-15 NOTE — TOC Progression Note (Addendum)
Transition of Care [facility]) - Progression Note    Patient Details  Name: Ezren Barfuss MRN: [id number] Date of Birth: 03-24-1932  Transition of Care [facility]) CM/SW [place], LCSW Phone Number: 10/15/2019, 2:18 PM  Clinical Narrative:   CSW spoke to patient's daughter Darin Engels [phone]). She confirmed she has notified patient that insurance denied CIR and he is agreeable to SNF at [facility]. CSW faxed clinicals to [facility] for insurance authorization review. SNF admissions coordinator needs to know discharge abx and tube feed information 24 hours in advance to be sure they have everything they need. ID pharmacist checking with attending to see if he can put in enter the orders and asked to change him to Ceftriaxone in the morning so next dose won't be due until Wednesday. Dietician said her recommendations from Friday are the recommendations for discharge. SNF aware.  2:57 pm: SNF asked if we could send some extra tube feeds at discharge. They are placing the order but it will take 48-72 hours for delivery. CSW sent message to dietician with this request and she said that should be fine. Made her aware of potential discharge for tomorrow.  Expected Discharge Plan: [place] Barriers to Discharge: Continued Medical Work up  Expected Discharge Plan and Services Expected Discharge Plan: [place] Choice: [place] arrangements for the past 2 months: [place]                                       Social Determinants of Health (SDOH) Interventions    Readmission Risk Interventions No flowsheet data found.

## 2019-10-15 NOTE — Care Management Important Message (Signed)
Important Message  Patient Details  Name: Dustin Harrison MRN: [id number] Date of Birth: 03/05/1932   Medicare Important Message Given:  Yes     Dannette Barbara 10/15/2019, 12:20 PM

## 2019-10-15 NOTE — Progress Notes (Signed)
Nutrition Follow Up Note   DOCUMENTATION CODES:   Non-severe (moderate) malnutrition in context of chronic illness  INTERVENTION:   Jevity 1.5- 6 cans daily via G-tube  Prostat liquid protein 30 ml daily via tube, each supplement provides 100 kcal, 15 grams protein.  Free water flushes 91m before and after each tube feeding   Regimen provides 2230kcal/day, 105g/day and 18063mday free water  NUTRITION DIAGNOSIS:   Moderate Malnutrition related to chronic illness(DM, CHF, CKD) as evidenced by moderate fat depletion, moderate muscle depletion, severe muscle depletion.  GOAL:   Patient will meet greater than or equal to 90% of their needs  -met with tube feeds   MONITOR:   Labs, Weight trends, Skin, TF tolerance, I & O's  ASSESSMENT:   [age]/o male with h/o DM, BPH, CHF, CKD II who was admitted chronic low back and neck pain   Pt s/p surgical G-tube placement (18Fr) and ventral hernia repair 5/6  Pt tolerating tube feeds well at goal. Refeed labs stable. BNP elevated. Lasix initiated. UOP 30066mWill monitor daily weights; last documented weight was on 5/6.    Medications reviewed and include: lasix, insulin, miralax, psyllium, penicillin, ceftriaxone  Labs reviewed: K 4.0 wnl, BUN 34(H) BNP- 1273(H)- 5/9 Wbc- 16.9(H) cbgs- 127, 104 x 24 hrs  Diet Order:   Diet Order            Diet NPO time specified  Diet effective now             EDUCATION NEEDS:   Education needs have been addressed  Skin:  Skin Assessment: Reviewed RN Assessment(abdominal incision)  Last BM:  5/9  Height:   Ht Readings from Last 1 Encounters:  10/11/19 _0  (1.803 m)    Weight:   Wt Readings from Last 1 Encounters:  10/11/19 87.1 kg    Ideal Body Weight:  78 kg  BMI:  Body mass index is 26.78 kg/m.  Estimated Nutritional Needs:   Kcal:  2100-2400kcal/day  Protein:  105-120g/day  Fluid:  >2L/day  CasKoleen Distance, RD, LDN Please refer to [facility] and/or RD  on-call/weekend/after hours pager

## 2019-10-16 DIAGNOSIS — I5022 Chronic systolic (congestive) heart failure: Secondary | ICD-10-CM | POA: Insufficient documentation

## 2019-10-16 DIAGNOSIS — N182 Chronic kidney disease, stage 2 (mild): Secondary | ICD-10-CM | POA: Diagnosis not present

## 2019-10-16 DIAGNOSIS — R7881 Bacteremia: Secondary | ICD-10-CM | POA: Diagnosis not present

## 2019-10-16 DIAGNOSIS — I10 Essential (primary) hypertension: Secondary | ICD-10-CM | POA: Diagnosis not present

## 2019-10-16 DIAGNOSIS — G8929 Other chronic pain: Secondary | ICD-10-CM | POA: Diagnosis not present

## 2019-10-16 DIAGNOSIS — M549 Dorsalgia, unspecified: Secondary | ICD-10-CM | POA: Diagnosis not present

## 2019-10-16 DIAGNOSIS — E1122 Type 2 diabetes mellitus with diabetic chronic kidney disease: Secondary | ICD-10-CM | POA: Insufficient documentation

## 2019-10-16 DIAGNOSIS — M2550 Pain in unspecified joint: Secondary | ICD-10-CM | POA: Diagnosis not present

## 2019-10-16 DIAGNOSIS — R609 Edema, unspecified: Secondary | ICD-10-CM | POA: Diagnosis not present

## 2019-10-16 DIAGNOSIS — G4484 Primary exertional headache: Secondary | ICD-10-CM | POA: Diagnosis not present

## 2019-10-16 DIAGNOSIS — E1159 Type 2 diabetes mellitus with other circulatory complications: Secondary | ICD-10-CM | POA: Diagnosis not present

## 2019-10-16 DIAGNOSIS — E119 Type 2 diabetes mellitus without complications: Secondary | ICD-10-CM | POA: Diagnosis not present

## 2019-10-16 DIAGNOSIS — R1312 Dysphagia, oropharyngeal phase: Secondary | ICD-10-CM | POA: Diagnosis not present

## 2019-10-16 DIAGNOSIS — B951 Streptococcus, group B, as the cause of diseases classified elsewhere: Secondary | ICD-10-CM | POA: Diagnosis not present

## 2019-10-16 DIAGNOSIS — E441 Mild protein-calorie malnutrition: Secondary | ICD-10-CM | POA: Diagnosis not present

## 2019-10-16 DIAGNOSIS — M545 Low back pain: Secondary | ICD-10-CM | POA: Diagnosis not present

## 2019-10-16 DIAGNOSIS — Z7401 Bed confinement status: Secondary | ICD-10-CM | POA: Diagnosis not present

## 2019-10-16 DIAGNOSIS — H409 Unspecified glaucoma: Secondary | ICD-10-CM | POA: Diagnosis not present

## 2019-10-16 DIAGNOSIS — N179 Acute kidney failure, unspecified: Secondary | ICD-10-CM | POA: Diagnosis not present

## 2019-10-16 DIAGNOSIS — I130 Hypertensive heart and chronic kidney disease with heart failure and stage 1 through stage 4 chronic kidney disease, or unspecified chronic kidney disease: Secondary | ICD-10-CM | POA: Diagnosis not present

## 2019-10-16 LAB — BASIC METABOLIC PANEL
Anion gap: 5 (ref 5–15)
BUN: 36 mg/dL — ABNORMAL HIGH (ref 8–23)
CO2: 28 mmol/L (ref 22–32)
Calcium: 7.7 mg/dL — ABNORMAL LOW (ref 8.9–10.3)
Chloride: 104 mmol/L (ref 98–111)
Creatinine, Ser: 1.2 mg/dL (ref 0.61–1.24)
GFR calc Af Amer: >60 mL/min (ref >60)
GFR calc non Af Amer: 55 mL/min — ABNORMAL LOW (ref >60)
Glucose, Bld: 129 mg/dL — ABNORMAL HIGH (ref 70–99)
Potassium: 4.2 mmol/L (ref 3.5–5.1)
Sodium: 137 mmol/L (ref 135–145)

## 2019-10-16 LAB — GLUCOSE, CAPILLARY
Glucose-Capillary: 176 mg/dL — ABNORMAL HIGH (ref 70–99)
Glucose-Capillary: 104 mg/dL — ABNORMAL HIGH (ref 70–99)

## 2019-10-16 LAB — PHOSPHORUS: Phosphorus: 2.9 mg/dL (ref 2.5–4.6)

## 2019-10-16 MED ORDER — SODIUM CHLORIDE 0.9% FLUSH: 10.0000 mL | Freq: Two times a day (BID) | INTRAVENOUS | 0 refills | Status: DC

## 2019-10-16 MED ORDER — JEVITY 1.5 CAL/FIBER PO LIQD: 240.0000 mL | Freq: Every day | ORAL | 0 refills | Status: DC

## 2019-10-16 MED ORDER — CEFTRIAXONE IV (FOR PTA / DISCHARGE USE ONLY)
2.0000 g | INTRAVENOUS | 0 refills | Status: AC
Start: 2019-10-17 — End: 2019-11-05

## 2019-10-16 MED ORDER — FUROSEMIDE 40 MG PO TABS: 40.0000 mg | ORAL_TABLET | Freq: Every day | ORAL | 0 refills | Status: DC

## 2019-10-16 MED ORDER — FREE WATER: 120.0000 mL | Freq: Every day | 0 refills | Status: DC

## 2019-10-16 MED ORDER — LIDOCAINE 5 % EX PTCH: 1.0000 | MEDICATED_PATCH | CUTANEOUS | 0 refills | Status: DC

## 2019-10-16 MED ORDER — INSULIN ASPART 100 UNIT/ML ~~LOC~~ SOLN: 4.0000 [IU] | Freq: Three times a day (TID) | SUBCUTANEOUS | 11 refills | Status: DC

## 2019-10-16 MED ORDER — CLONIDINE 0.1 MG/24HR TD PTWK: 0.1000 mg | MEDICATED_PATCH | TRANSDERMAL | 0 refills | Status: DC

## 2019-10-16 MED ORDER — PRO-STAT SUGAR FREE PO LIQD: 30.0000 mL | Freq: Every day | ORAL | 0 refills | Status: DC

## 2019-10-16 MED ORDER — CARVEDILOL 6.25 MG PO TABS: 6.2500 mg | ORAL_TABLET | Freq: Two times a day (BID) | ORAL | 0 refills | Status: DC

## 2019-10-16 NOTE — TOC Progression Note (Addendum)
Transition of Care [facility]) - Progression Note    Patient Details  Name: Dustin Harrison MRN: [id number] Date of Birth: 1931/10/18  Transition of Care [facility]) CM/SW [name], LCSW Phone Number: 10/16/2019, 9:10 AM  Clinical Narrative:  Insurance authorization approved for discharge to [facility] SNF today if stable: [id number]. SNF is prepared to provide IV abx. Tube feeds will not be delivered until Thursday so will need feeds sent from hospital to get him through Thursday. Sent dietician a secure chat message to notify.  11:01 am: Dietician brought up a case of Jevity for patient to take with him to [facility]. This supply will last him 4 days. SNF is aware and able to accept patient today. MD aware.  Expected Discharge Plan: [place] Barriers to Discharge: Continued Medical Work up  Expected Discharge Plan and Services Expected Discharge Plan: [place] Choice: [place] arrangements for the past 2 months: [place]                                       Social Determinants of Health (SDOH) Interventions    Readmission Risk Interventions No flowsheet data found.

## 2019-10-16 NOTE — Discharge Summary (Deleted)
Physician Discharge Summary  Patient ID: Dustin Harrison MRN: [id number] DOB/AGE: [birth year] 84 y.o.  Admit date: 10/07/2019 Discharge date: 10/16/2019  Admission Diagnoses:  Discharge Diagnoses:  Principal Problem:   AKI (acute kidney injury) ([place]) Active Problems:   Diabetes mellitus without complication ([place])   Essential hypertension, benign   CKD (chronic kidney disease) stage 2, GFR 60-89 ml/min   Back pain   Leukocytosis   Cardiomyopathy ([place])   Bacteremia   HFrEF (heart failure with reduced ejection fraction) ([facility])   Malnutrition of moderate degree   Chronic systolic CHF (congestive heart failure) [facility])   Discharged Condition: good  Hospital Course:  Jaiceion Hagen Johnsonis a 86 y.o.malewith medical history significant forhypertension, diabetes mellitus and chronic low back pain who presents to the emergency room for evaluation of worsening low back pain and generalized weakness. Patient had noncontrasted MRI of cervical, thoracic and lumbar spines,showed a significant degenerative disc disease,but no evidence of osteomyelitis. Patient also had positive blood culture with group B streptococcus. Penicillin G is started after consultation withID. 5/6.Patient continued to have significant dysphagia, patient was seen by ENT, patient had a significant right vocal cord paralysis.Feeding tube replaced. 5/7.TEE was performed, did not show evidence of endocarditis. 5/8. PICC placed, CT abd/pelvisdid not see any evidence of infection or abscess.There is a2.2 cm intraluminal density in the duodenum, requiring future EGD work-up.  #1.  Group B streptococcus bacteremia. Unable to identify source of infection, negative TEE, negative CT abdomen/pelvis. We will continue Rocephin, last dose 5/31.  Please remove PICC line after completion of antibiotics.  #2.  Dysphagia. Secondary to vocal cord paralysis.  Continue tube feeding.NPO. Please follow-up with speech therapy.  May  also follow-up with ENT in 1 to 2 months to look at her vocal cord.   3.  Chronic systolic congestive heart failure with ejection fraction 25 to 30%. Continue losartan, Coreg. Please schedule follow-up with cardiology in 2 or 3 weeks. Continue Lasix some mild volume overload. Check a BMP and magnesium in 1 week time.  4.  Acute kidney injury. Renal function has improved.  5.  Thrombocytopenia. Resolved.  6.  Type 2 diabetes. Added a small dose of insulin before meals.  7.  Essential hypertension. Continue current treatment.  8.  Duodenal abnormality. Need to schedule GI visit with the future EGD to evaluate.  8.  Right-sided weakness. Stable.  Patient has been denied for acute rehab facility.       Consults:  Cardiology Infectious disease Neurology Nephrology  Significant Diagnostic Studies:  Echocardiogram showed ejection fraction 20 to 25%, TEE did not show any vegetation. CT ABDOMEN AND PELVIS WITH CONTRAST  TECHNIQUE: Multidetector CT imaging of the abdomen and pelvis was performed using the standard protocol following bolus administration of intravenous contrast.  CONTRAST:  156mL OMNIPAQUE IOHEXOL 300 MG/ML  SOLN  COMPARISON:  None.  FINDINGS: Lower chest: Small left pleural effusion and trace right pleural effusion. Subsegmental dependent atelectasis posteriorly in the left lower lobe. Linear scarring or platelike atelectasis medially at the right lung base.  Hepatobiliary: Scattered punctate calcified granulomas. Gallbladder physiologically distended. No biliary ductal dilatation. No focal liver lesion. Portal vein patent.  Pancreas: Unremarkable. No pancreatic ductal dilatation or surrounding inflammatory changes.  Spleen: Normal size. Scattered small calcified granulomas.  Adrenals/Urinary Tract: Left adrenal hypertrophy. Marked parenchymal atrophy in the right kidney. Compensatory hypertrophy on the left. No hydronephrosis.  Urinary bladder physiologically distended.  Stomach/Bowel: Stomach is incompletely distended. Balloon-retention gastrostomy catheter projects in good position. There  is fluid-distension of the proximal duodenum, with 2.2 x 3.8 cm intraluminal soft tissue nodular density which may represent mass, conceivably hematoma or ingested material. The remainder of the small bowel is decompressed, unremarkable. Appendix not identified. Residual contrast material and gas in the proximal colon, decompressed distally.  Vascular/Lymphatic: Mild calcified aortic plaque without aneurysm or stenosis. No abdominal or pelvic adenopathy.  Reproductive: Prostate is unremarkable.  Other: No ascites. No free air.  Musculoskeletal: Mild multilevel lumbar spondylitic change. Bilateral hip DJD. No acute fracture or worrisome bone lesion.  IMPRESSION: 1. 2.2 cm intraluminal nodular density of the proximal duodenum, may represent mass, ingested material, or possibly hematoma. If clinically appropriate, consider elective outpatient endoscopy for further characterization. 2. Small left and trace right pleural effusions. 3. Right renal parenchymal atrophy with compensatory hypertrophy on the left. 4. Old granulomatous disease. 5. Lumbar and bilateral hip DJD.  Aortic Atherosclerosis (ICD10-I70.0).   Electronically Signed   By: Lucrezia Europe M.D.   On: 10/13/2019 12:06   Treatments: antibiotics: PCN G  Discharge Exam: Blood pressure (!) 148/66, pulse 98, temperature 97.7 F (36.5 C), temperature source Oral, resp. rate 20, height 5\' 11"  (1.803 m), weight 87.1 kg, SpO2 99 %. General appearance: alert and cooperative Resp: clear to auscultation bilaterally Cardio: regular rate and rhythm, S1, S2 normal, no murmur, click, rub or gallop GI: soft, non-tender; bowel sounds normal; no masses,  no organomegaly Extremities: edema trace  Disposition: Discharge disposition: 03-Skilled Nursing  Facility       Discharge Instructions    Diet - low sodium heart healthy   Complete by: As directed    Home infusion instructions   Complete by: As directed    Instructions: Flushing of vascular access device: 0.9% NaCl pre/post medication administration and prn patency; Heparin 100 u/ml, 59ml for implanted ports and Heparin 10u/ml, 74ml for all other central venous catheters.   Increase activity slowly   Complete by: As directed      Allergies as of 10/16/2019   No Known Allergies     Medication List    TAKE these medications   acetaminophen 500 MG tablet Commonly known as: TYLENOL Take 500 mg by mouth daily as needed.   atorvastatin 20 MG tablet Commonly known as: LIPITOR TAKE ONE TABLET EVERY DAY   carvedilol 6.25 MG tablet Commonly known as: COREG Take 1 tablet (6.25 mg total) by mouth 2 (two) times daily with a meal.   cefTRIAXone  IVPB Commonly known as: ROCEPHIN Inject 2 g into the vein daily for 19 days. Indication: Group B streptococcal bacteremia First Dose: Yes Last Day of Therapy:  11/05/2019 Labs - Once weekly:  CBC/D and BMP Start taking on: Oct 17, 2019   cloNIDine 0.1 mg/24hr patch Commonly known as: CATAPRES - Dosed in mg/24 hr Place 1 patch (0.1 mg total) onto the skin once a week. Start taking on: Oct 18, 2019   feeding supplement (PRO-STAT SUGAR FREE 64) Liqd Place 30 mLs into feeding tube daily. Start taking on: Oct 17, 2019   finasteride 5 MG tablet Commonly known as: PROSCAR TAKE ONE TABLET EVERY DAY   furosemide 40 MG tablet Commonly known as: LASIX Place 1 tablet (40 mg total) into feeding tube daily. Start taking on: Oct 17, 2019   insulin aspart 100 UNIT/ML injection Commonly known as: novoLOG Inject 4 Units into the skin 3 (three) times daily with meals.   lidocaine 5 % Commonly known as: LIDODERM Place 1 patch onto the skin daily.  Remove & Discard patch within 12 hours or as directed by MD   losartan 100 MG  tablet Commonly known as: COZAAR TAKE ONE TABLET EVERY DAY   METAMUCIL FIBER PO Take 2 Scoops by mouth daily.   sodium chloride flush 0.9 % Soln Commonly known as: NS 10-40 mLs by Intracatheter route every 12 (twelve) hours.   Systane 0.4-0.3 % Gel ophthalmic gel Generic drug: Polyethyl Glycol-Propyl Glycol Place 1 application into both eyes.   tamsulosin 0.4 MG Caps capsule Commonly known as: FLOMAX TAKE 2 CAPSULES EVERY DAY   timolol 0.5 % ophthalmic solution Commonly known as: TIMOPTIC Place 1 drop into both eyes 2 (two) times daily.            Home Infusion Instuctions  (From admission, onward)         Start     Ordered   10/16/19 0000  Home infusion instructions    Question:  Instructions  Answer:  Flushing of vascular access device: 0.9% NaCl pre/post medication administration and prn patency; Heparin 100 u/ml, 47ml for implanted ports and Heparin 10u/ml, 66ml for all other central venous catheters.   10/16/19 1151          Contact information for follow-up providers    [place].   Specialty: Emergency Medicine Why: If symptoms worsen Contact information: [place] [id number] [address] [place] [phone]       Schedule an appointment as soon as possible for a visit  with Jinny Sanders, MD.   Specialty: Family Medicine Contact information: [place] [address] [phone]        [place] .   Specialty: Radiology Contact information: [address] [id number] [address] [place]       Tsosie Billing, MD Follow up in 2 week(s).   Specialty: Infectious Diseases Contact information: [place] [address] [phone]            Contact information for after-discharge care    Destination    [facility] .    Service: Skilled Nursing Contact information: [place] [phone]                45 minutes  Signed: Sharen Hones 10/16/2019, 12:07 PM

## 2019-10-16 NOTE — Progress Notes (Signed)
Patient assisted the the [facility]. Patient had a medium formed stool. Patient then assisted to the bed. He insisted on not being very elevated. Patient verbalized his hx of neck pain and states being flatter without a pillow help. Patient educated on his aspiration risks. Will continue to monitor.

## 2019-10-16 NOTE — Discharge Instructions (Signed)
[place] [facility] Stay  Jevity 1.5- 6 cans daily via G-tube  Prostat liquid protein 30 ml daily via tube, each supplement provides 100 kcal, 15 grams protein.  Free water flushes 1ml before and after each tube feeding   Regimen provides 2230kcal/day, 105g/day and 1854ml/day free water

## 2019-10-16 NOTE — TOC Transition Note (Signed)
Transition of Care [facility]) - CM/SW Discharge Note   Patient Details  Name: Delaney Partsch MRN: [id number] Date of Birth: [birth year]  Transition [facility]) CM/SW Contact:  Candie Chroman, LCSW Phone Number: 10/16/2019, 12:41 PM   Clinical Narrative: Patient has orders to transport to [facility] today. Nurse will call report to [phone] (Room 216). EMS has been scheduled for in about an hour. No further concerns. CSW signing off.   Final next level of care: Skilled Nursing Facility Barriers to Discharge: Barriers Resolved   Patient Goals and CMS Choice   CMS Medicare.gov Compare Post Acute Care list provided to:: Patient(Wife and daughter at bedside.) Choice offered to / list presented to : Patient, Spouse, Adult Children  Discharge Placement PASRR number recieved: 10/12/19            Patient chooses bed at: [facility] Patient to be transferred to facility by: EMS Name of family member notified: Sheral Apley and Darin Engels Patient and family notified of of transfer: 10/16/19  Discharge Plan and Services     Post Acute Care Choice: Home Health                               Social Determinants of Health (SDOH) Interventions     Readmission Risk Interventions No flowsheet data found.

## 2019-10-16 NOTE — Progress Notes (Signed)
Physical Therapy Treatment Patient Details Name: Dustin Harrison MRN: [id number] DOB: Dec 19, 1931 Today's Date: 10/16/2019    History of Present Illness Pt. is an 86 y.o. male who presented to ER secondary to progressive neck/back pain and R UE weakness after fall 2 days prior to admission; admitted for management of AKI and bacteremia (unknown source at this time).  C-spine, T-spine and L-spine imaging significant for prominent C3-4 and C4-5 impingement, moderate C2-3 and L4-5 impingement and mild C5-6, C7-T1, L3-4 and L5-S1 impingement.  MRI attempted to evaluate for CVA; unable to obtain accurate imaging due to artifact from cochlear implant.  Hospital course additionally significant for PEG placement due to vocal cord paralysis.    PT Comments    Pt was seated in recliner pre/post PT session. He is cooperative and pleasant throughout session. Pt was able to stand and ambulate 200 ft with RW + 1 standing rest break half way. Vitals stable throughout. He required CGA for safety to stand and ambulate. Demonstrated poor eccentric control with stand to sit but does state understanding when educated on improved technique. Overall pt tolerated session well. He will benefit from SNF at DC to address strength, balance, and overall safety with all functional mobility. Pt was in recliner with call bell in reach, BLEs elevated, and chair alarm in place. Acute PT will continue to follow per POC.      Follow Up Recommendations  SNF     Equipment Recommendations  Other (comment)(defer to next level of care)    Recommendations for Other Services       Precautions / Restrictions Precautions Precautions: Fall Precaution Comments: NPO, PEG Restrictions Weight Bearing Restrictions: No    Mobility  Bed Mobility               General bed mobility comments: pt was seated in recliner pre/post session  Transfers Overall transfer level: Needs assistance Equipment used: Rolling walker (2  wheeled) Transfers: Sit to/from Stand Sit to Stand: Min guard         General transfer comment: CGA to STS from recliner. poor eccentric control with stand to sit. heavy RW use for stability with all standing activity  Ambulation/Gait Ambulation/Gait assistance: Min guard Gait Distance (Feet): 200 Feet Assistive device: Rolling walker (2 wheeled) Gait Pattern/deviations: Step-through pattern Gait velocity: decreased   General Gait Details: pt was able to safely ambulate 200 ft with RW with 1 standing rest.    Stairs             Wheelchair Mobility    Modified Rankin (Stroke Patients Only)       Balance Overall balance assessment: Needs assistance Sitting-balance support: No upper extremity supported;Feet supported Sitting balance-Leahy Scale: Good Sitting balance - Comments: Pt maintained steady sitting balance in bedside chair both during static and dynamic activities (self feeding)   Standing balance support: Bilateral upper extremity supported;During functional activity Standing balance-Leahy Scale: Fair Standing balance comment: requires CGA for safety with ambulation/functional task in standing                            Cognition Arousal/Alertness: Awake/alert Behavior During Therapy: WFL for tasks assessed/performed Overall Cognitive Status: Within Functional Limits for tasks assessed                                 General Comments: Pt was A and O x 3.  agreeable to PT session and cooperative and pleasant throughout.      Exercises      General Comments        Pertinent Vitals/Pain Pain Assessment: No/denies pain Pain Score: 0-No pain    Home Living                      Prior Function            PT Goals (current goals can now be found in the care plan section) Acute Rehab PT Goals Patient Stated Goal: get back home to care for my wife Progress towards PT goals: Progressing toward goals     Frequency    7X/week      PT Plan Current plan remains appropriate    Co-evaluation              AM-PAC PT "6 Clicks" Mobility   Outcome Measure  Help needed turning from your back to your side while in a flat bed without using bedrails?: A Little Help needed moving from lying on your back to sitting on the side of a flat bed without using bedrails?: A Little Help needed moving to and from a bed to a chair (including a wheelchair)?: A Little Help needed standing up from a chair using your arms (e.g., wheelchair or bedside chair)?: A Little Help needed to walk in hospital room?: A Little Help needed climbing 3-5 steps with a railing? : A Lot 6 Click Score: 17    End of Session Equipment Utilized During Treatment: Gait belt Activity Tolerance: Patient tolerated treatment well Patient left: with call bell/phone within reach;in chair;with chair alarm set Nurse Communication: Mobility status PT Visit Diagnosis: Muscle weakness (generalized) (M62.81);Difficulty in walking, not elsewhere classified (R26.2);Hemiplegia and hemiparesis Hemiplegia - Right/Left: Right Hemiplegia - dominant/non-dominant: Dominant Hemiplegia - caused by: Unspecified     Time: 1020-1037 PT Time Calculation (min) (ACUTE ONLY): 17 min  Charges:  $Gait Training: 8-22 mins                     Julaine Fusi PTA 10/16/19, 12:02 PM

## 2019-10-16 NOTE — Discharge Summary (Signed)
Physician Discharge Summary  Patient ID: Dustin Harrison MRN: [id number] DOB/AGE: 09/10/1931 86 y.o.  Admit date: 10/07/2019 Discharge date: 10/16/2019  Admission Diagnoses:  Discharge Diagnoses:  Principal Problem:   AKI (acute kidney injury) ([place]) Active Problems:   Diabetes mellitus without complication ([place])   Essential hypertension, benign   CKD (chronic kidney disease) stage 2, GFR 60-89 ml/min   Back pain   Leukocytosis   Cardiomyopathy ([place])   Bacteremia   HFrEF (heart failure with reduced ejection fraction) ([facility])   Malnutrition of moderate degree   Chronic systolic CHF (congestive heart failure) [facility])   Discharged Condition: good  Hospital Course:  Dustin Deignan Johnsonis a 86 y.o.malewith medical history significant forhypertension, diabetes mellitus and chronic low back pain who presents to the emergency room for evaluation of worsening low back pain and generalized weakness. Patient had noncontrasted MRI of cervical, thoracic and lumbar spines,showed a significant degenerative disc disease,but no evidence of osteomyelitis. Patient also had positive blood culture with group B streptococcus. Penicillin G is started after consultation withID. 5/6.Patient continued to have significant dysphagia, patient was seen by ENT, patient had a significant right vocal cord paralysis.Feeding tube replaced. 5/7.TEE was performed, did not show evidence of endocarditis. 5/8. PICC placed, CT abd/pelvisdid not see any evidence of infection or abscess.There is a2.2 cm intraluminal density in the duodenum, requiring future EGD work-up.  #1. Group B streptococcus bacteremia. Unable to identify source of infection, negative TEE, negative CT abdomen/pelvis. We will continue Rocephin, last dose 5/31.  Please remove PICC line after completion of antibiotics.  #2. Dysphagia. Secondary to vocal cord paralysis. Continue tube feeding.NPO. Please follow-up with speech therapy.   May also follow-up with ENT in 1 to 2 months to look at her vocal cord.   3. Chronic systolic congestive heart failure with ejection fraction 25 to 30%. Continue losartan, Coreg. Please schedule follow-up with cardiology in 2 or 3 weeks. Continue Lasix some mild volume overload. Check a BMP and magnesium in 1 week time.  4. Acute kidney injury. Renal function has improved.  5. Thrombocytopenia. Resolved.  6. Type 2 diabetes. Added a small dose of insulin before meals.  7. Essential hypertension. Continue current treatment.  8.  Duodenal abnormality. Need to schedule GI visit with the future EGD to evaluate.  8. Right-sided weakness. Stable. Patient has been denied for acute rehab facility.     Consults:  Cardiology Infectious disease Neurology Nephrology  Significant Diagnostic Studies:  Echocardiogram showed ejection fraction 20 to 25%, TEE did not show any vegetation. CT ABDOMEN AND PELVIS WITH CONTRAST  TECHNIQUE: Multidetector CT imaging of the abdomen and pelvis was performed using the standard protocol following bolus administration of intravenous contrast.  CONTRAST: 181mL OMNIPAQUE IOHEXOL 300 MG/ML SOLN  COMPARISON: None.  FINDINGS: Lower chest: Small left pleural effusion and trace right pleural effusion. Subsegmental dependent atelectasis posteriorly in the left lower lobe. Linear scarring or platelike atelectasis medially at the right lung base.  Hepatobiliary: Scattered punctate calcified granulomas. Gallbladder physiologically distended. No biliary ductal dilatation. No focal liver lesion. Portal vein patent.  Pancreas: Unremarkable. No pancreatic ductal dilatation or surrounding inflammatory changes.  Spleen: Normal size. Scattered small calcified granulomas.  Adrenals/Urinary Tract: Left adrenal hypertrophy. Marked parenchymal atrophy in the right kidney. Compensatory hypertrophy on the left. No  hydronephrosis. Urinary bladder physiologically distended.  Stomach/Bowel: Stomach is incompletely distended. Balloon-retention gastrostomy catheter projects in good position. There is fluid-distension of the proximal duodenum, with 2.2 x 3.8 cm intraluminal soft tissue nodular  density which may represent mass, conceivably hematoma or ingested material. The remainder of the small bowel is decompressed, unremarkable. Appendix not identified. Residual contrast material and gas in the proximal colon, decompressed distally.  Vascular/Lymphatic: Mild calcified aortic plaque without aneurysm or stenosis. No abdominal or pelvic adenopathy.  Reproductive: Prostate is unremarkable.  Other: No ascites. No free air.  Musculoskeletal: Mild multilevel lumbar spondylitic change. Bilateral hip DJD. No acute fracture or worrisome bone lesion.  IMPRESSION: 1. 2.2 cm intraluminal nodular density of the proximal duodenum, may represent mass, ingested material, or possibly hematoma. If clinically appropriate, consider elective outpatient endoscopy for further characterization. 2. Small left and trace right pleural effusions. 3. Right renal parenchymal atrophy with compensatory hypertrophy on the left. 4. Old granulomatous disease. 5. Lumbar and bilateral hip DJD.  Aortic Atherosclerosis (ICD10-I70.0).   Electronically Signed By: Lucrezia Europe M.D. On: 10/13/2019 12:06    Treatments: PCN G  Discharge Exam: Blood pressure 118/62, pulse 79, temperature 98.8 F (37.1 C), temperature source Oral, resp. rate 19, height 5\' 11"  (1.803 m), weight 87.1 kg, SpO2 100 %. General appearance: alert and cooperative Resp: clear to auscultation bilaterally Cardio: regular rate and rhythm, S1, S2 normal, no murmur, click, rub or gallop GI: soft, non-tender; bowel sounds normal; no masses,  no organomegaly Extremities: edema trace   Disposition: Discharge disposition: 03-Skilled Nursing  Facility     Note: Straightly NPO  Discharge Instructions    Diet - low sodium heart healthy   Complete by: As directed    Diet general   Complete by: As directed    NPO, Tube feeding only.  Jevity 1.5 bolus feeding: 239ml 6 times/day, Free water 154ml every 6 hours.   Discharge instructions   Complete by: As directed    Discontinue PICC after antibiotics completed.   Home infusion instructions   Complete by: As directed    Instructions: Flushing of vascular access device: 0.9% NaCl pre/post medication administration and prn patency; Heparin 100 u/ml, 64ml for implanted ports and Heparin 10u/ml, 6ml for all other central venous catheters.   Increase activity slowly   Complete by: As directed    Increase activity slowly   Complete by: As directed      Allergies as of 10/16/2019   No Known Allergies     Medication List    TAKE these medications   acetaminophen 500 MG tablet Commonly known as: TYLENOL Take 500 mg by mouth daily as needed.   atorvastatin 20 MG tablet Commonly known as: LIPITOR TAKE ONE TABLET EVERY DAY   carvedilol 6.25 MG tablet Commonly known as: COREG Take 1 tablet (6.25 mg total) by mouth 2 (two) times daily with a meal.   cefTRIAXone  IVPB Commonly known as: ROCEPHIN Inject 2 g into the vein daily for 19 days. Indication: Group B streptococcal bacteremia First Dose: Yes Last Day of Therapy:  11/05/2019 Labs - Once weekly:  CBC/D and BMP Start taking on: Oct 17, 2019   cloNIDine 0.1 mg/24hr patch Commonly known as: CATAPRES - Dosed in mg/24 hr Place 1 patch (0.1 mg total) onto the skin once a week. Start taking on: Oct 18, 2019   feeding supplement (JEVITY 1.5 CAL/FIBER) Liqd Place 240 mLs into feeding tube 6 (six) times daily.   feeding supplement (PRO-STAT SUGAR FREE 64) Liqd Place 30 mLs into feeding tube daily. Start taking on: Oct 17, 2019   finasteride 5 MG tablet Commonly known as: PROSCAR TAKE ONE TABLET EVERY DAY   free water  Soln Place 120 mLs into feeding tube 6 (six) times daily.   furosemide 40 MG tablet Commonly known as: LASIX Place 1 tablet (40 mg total) into feeding tube daily. Start taking on: Oct 17, 2019   insulin aspart 100 UNIT/ML injection Commonly known as: novoLOG Inject 4 Units into the skin 3 (three) times daily with meals.   lidocaine 5 % Commonly known as: LIDODERM Place 1 patch onto the skin daily. Remove & Discard patch within 12 hours or as directed by MD   losartan 100 MG tablet Commonly known as: COZAAR TAKE ONE TABLET EVERY DAY   METAMUCIL FIBER PO Take 2 Scoops by mouth daily.   sodium chloride flush 0.9 % Soln Commonly known as: NS 10-40 mLs by Intracatheter route every 12 (twelve) hours.   Systane 0.4-0.3 % Gel ophthalmic gel Generic drug: Polyethyl Glycol-Propyl Glycol Place 1 application into both eyes.   tamsulosin 0.4 MG Caps capsule Commonly known as: FLOMAX TAKE 2 CAPSULES EVERY DAY   timolol 0.5 % ophthalmic solution Commonly known as: TIMOPTIC Place 1 drop into both eyes 2 (two) times daily.            Home Infusion Instuctions  (From admission, onward)         Start     Ordered   10/16/19 0000  Home infusion instructions    Question:  Instructions  Answer:  Flushing of vascular access device: 0.9% NaCl pre/post medication administration and prn patency; Heparin 100 u/ml, 23ml for implanted ports and Heparin 10u/ml, 82ml for all other central venous catheters.   10/16/19 1151          Contact information for follow-up providers    [place].   Specialty: Emergency Medicine Why: If symptoms worsen Contact information: [place] [id number] [address] [place] [phone]       Schedule an appointment as soon as possible for a visit  with Jinny Sanders, MD.   Specialty: Family Medicine Contact information: [place]  [address] [phone]        [place] .   Specialty: Radiology Contact information: [address] [id number] [address] [place]       Tsosie Billing, MD Follow up in 2 week(s).   Specialty: Infectious Diseases Contact information: [place] [address] [phone]        Wellington Hampshire, MD Follow up in 2 week(s).   Specialty: Cardiology Contact information: [place] [address] [phone]            Contact information for after-discharge care    Destination    [facility] .   Service: Skilled Nursing Contact information: [place] [phone]                 45 minutes Signed: Sharen Hones 10/16/2019, 3:03 PM

## 2019-10-16 NOTE — Plan of Care (Signed)
The patient has been discharged via EMS. Vitals stable upon discharged. The patient left with PICC line for antibiotic treatment. No falls. Tube feed orders explained to [facility] nurse. Tube feeds 6  (287m ) times a day and 6 flushes  a day of 1216meach.  Problem: Health Behavior/Discharge Planning: Goal: Ability to manage health-related needs will improve Outcome: Completed/Met   Problem: Clinical Measurements: Goal: Ability to maintain clinical measurements within normal limits will improve Outcome: Completed/Met Goal: Cardiovascular complication will be avoided Outcome: Completed/Met   Problem: Activity: Goal: Risk for activity intolerance will decrease Outcome: Completed/Met   Problem: Nutrition: Goal: Adequate nutrition will be maintained Outcome: Completed/Met   Problem: Coping: Goal: Level of anxiety will decrease Outcome: Completed/Met   Problem: Elimination: Goal: Will not experience complications related to bowel motility Outcome: Completed/Met Goal: Will not experience complications related to urinary retention Outcome: Completed/Met   Problem: Pain Managment: Goal: General experience of comfort will improve Outcome: Completed/Met   Problem: Safety: Goal: Ability to remain free from injury will improve Outcome: Completed/Met   Problem: Skin Integrity: Goal: Risk for impaired skin integrity will decrease Outcome: Completed/Met   Problem: Acute Rehab PT Goals(only PT should resolve) Goal: Pt Will Go Supine/Side To Sit Outcome: Completed/Met Goal: Pt Will Transfer Bed To Chair/Chair To Bed Outcome: Completed/Met Goal: Pt Will Ambulate Outcome: Completed/Met   Problem: Acute Rehab OT Goals (only OT should resolve) Goal: Pt. Will Perform Grooming Outcome: Completed/Met Goal: Pt. Will Perform Lower Body Dressing Outcome: Completed/Met Goal: Pt. Will Transfer To Toilet Outcome: Completed/Met Goal: OT Additional ADL Goal #1 Outcome: Completed/Met    Problem: Malnutrition  (NI-5.2) Goal: Food and/or nutrient delivery Description: Individualized approach for food/nutrient provision. Outcome: Completed/Met

## 2019-10-18 LAB — COMP PANEL: LEUKEMIA/LYMPHOMA

## 2019-10-19 DIAGNOSIS — G4484 Primary exertional headache: Secondary | ICD-10-CM | POA: Diagnosis not present

## 2019-10-26 ENCOUNTER — Other Ambulatory Visit: Payer: Self-pay | Admitting: Internal Medicine

## 2019-10-26 DIAGNOSIS — G8929 Other chronic pain: Secondary | ICD-10-CM

## 2019-10-29 DIAGNOSIS — R609 Edema, unspecified: Secondary | ICD-10-CM | POA: Diagnosis not present

## 2019-11-13 DIAGNOSIS — R1312 Dysphagia, oropharyngeal phase: Secondary | ICD-10-CM | POA: Diagnosis not present

## 2019-11-13 DIAGNOSIS — I10 Essential (primary) hypertension: Secondary | ICD-10-CM | POA: Diagnosis not present

## 2019-11-13 DIAGNOSIS — B951 Streptococcus, group B, as the cause of diseases classified elsewhere: Secondary | ICD-10-CM | POA: Diagnosis not present

## 2019-11-13 DIAGNOSIS — I5022 Chronic systolic (congestive) heart failure: Secondary | ICD-10-CM | POA: Diagnosis not present

## 2019-11-13 DIAGNOSIS — R7881 Bacteremia: Secondary | ICD-10-CM

## 2019-11-13 DIAGNOSIS — E441 Mild protein-calorie malnutrition: Secondary | ICD-10-CM

## 2019-11-13 DIAGNOSIS — E1159 Type 2 diabetes mellitus with other circulatory complications: Secondary | ICD-10-CM

## 2019-11-25 NOTE — Progress Notes (Signed)
Office Visit    Patient Name: [facility]in [facility] Date of Encounter: 11/26/2019  Primary [facility] Provider:  Jinny Sanders, MD Primary Cardiologist:  Dustin Rogue, MD  Chief Complaint    Chief Complaint  Patient presents with  . OTHER    Syncopal spells. Meds reviewed verbally with pt.    86 yo male with history of HFrEF (25-30%), bradycardia, recent syncope as below, h/o pheochromocytoma, R adrenal tumor s/p R adrenalectomy in 2006, cochlear implant on Dustin left side 06/2018, DM2, chronic back pain for 20 years, recent bacteremia s/p TEE and s/p IV abx, CKD, and who is being seen today for Dustin evaluation of recent syncope without recurrence x3 days.   Past Medical History    Past Medical History:  Diagnosis Date  . Abnormal glucose   . Arthritis   . Baker's cyst of knee    left  . BPH (benign prostatic hypertrophy)   . Cough    because of "tight" esophagus  . Diabetes mellitus   . Glaucoma   . HOH (hard of hearing)   . Hypertension   . Pheochromocytoma of right adrenal gland   . Ulcer   . Wears hearing aid    bilateral   Past Surgical History:  Procedure Laterality Date  . adrenaletomy    . CATARACT EXTRACTION W/PHACO Left 10/08/2015   Procedure: CATARACT EXTRACTION PHACO AND INTRAOCULAR LENS PLACEMENT ([place]) left eye;  Surgeon: Dustin Koyanagi, MD;  Location: [facility];  Service: Ophthalmology;  Laterality: Left;  MALYUGIN  . HERNIA REPAIR    . SQUAMOUS CELL CARCINOMA EXCISION  03-2006   left ear  . TEE WITHOUT CARDIOVERSION N/A 10/12/2019   Procedure: TRANSESOPHAGEAL ECHOCARDIOGRAM (TEE);  Surgeon: Dustin Merritts, MD;  Location: [facility];  Service: Cardiovascular;  Laterality: N/A;  . TONSILLECTOMY    . XI ROBOTIC LAPAROSCOPIC ASSISTED APPENDECTOMY N/A 10/11/2019   Procedure: XI ROBOTIC ASSISTED LAPAROSCOPIC INSERTION GASTROSTOMY TUBE;  Surgeon: Dustin Husbands, MD;  Location: [facility];  Service: General;  Laterality: N/A;    Allergies  No Known  Allergies  History of Present Illness    Dustin [facility] is a 86 y.o. male with PMH as above. In Dustin past, he was reportedly very active and typically would walk on a daily basis with his wife, as well as golf regularly. He reportedly ambulated with a cane on a daily basis.   He was seen at [facility] by [facility] 10/09/19 after admission and positive cultures for Group B strep bacteremia. Dustin source of infection was unable to be identified. BMET showed AKI with renal function improved by discharge. CBC showed thrombocytopenia, resolved by discharge. TTE was performed and showed significantly reduced EF 25-35% (no previous study), G1DD, and severe akinesis of Dustin L ventricular med apical anteroseptal wall, anterolateral wall, anterior segment, apical segment, and anteroseptal wall with images to suggest stress induced CM versus LAD infarct. Recommendation was therefore for further ischemic workup after discharge. Given his bacteremia, TEE was also performed and without vegetation / evidence of endocarditis. TEE did, however, show moderate AS not visualized on TTE. Given his bacteremia, he was continued on Rocephin with last dose estimated 5/31, at which time his PICC line would be removed.. Given dysphagia experienced during his admission, he was seen by ENT with dx of R vocal cord paralysis and started on feeding tube, which was continued at discharge with recommendation for speech therapy and ENT follow-up. It was noted there was a 2.2cm  intraluminal density in Dustin duodenum that would require future EGD workup / GI follow-up. He was unfortunately denied acute rehab for R sided weakness and instead discharged to [facility].  At discharge, he was continued on Coreg, Losartan, and Lasix with recommendation to taper off of clonidine patch with further escalation of GDMT. At follow-up, it was recommended that spironolactone be added if possible. Again, he was continued on IV abx.  He reports today that he  returned home for father's day yesterday, and he has been feeling well since discharge. He notes, however, recent syncopal episodes with LOC approximately 10 seconds per daughter. No preceding sx. Fortunately, he has not had any syncope in at least 3 days, however. Dustin syncopal episodes occurred while ambulating, per patient. He denies any CP, racing HR, palpitations, n/v, orthopnea, early satiety, or abdominal distention.  No s/sx of bleeding. He reports medication compliance.   Of note, his wife has also recently been seen at [facility] with plans for catheterization.  Home Medications    Prior to Admission medications   Medication Sig Start Date End Date Taking? Authorizing Provider  acetaminophen (TYLENOL) 500 MG tablet Take 500 mg by mouth daily as needed.    [provider]  Amino Acids-Protein Hydrolys (FEEDING SUPPLEMENT, PRO-STAT SUGAR FREE 64,) LIQD Place 30 mLs into feeding tube daily. 10/17/19   Dustin Hones, MD  atorvastatin (LIPITOR) 20 MG tablet TAKE ONE TABLET EVERY DAY 07/31/19   [facility], Dustin E, MD  carvedilol (COREG) 6.25 MG tablet Take 1 tablet (6.25 mg total) by mouth 2 (two) times daily with a meal. 10/16/19   Dustin Hones, MD  cloNIDine (CATAPRES - DOSED IN MG/24 HR) 0.1 mg/24hr patch Place 1 patch (0.1 mg total) onto Dustin skin once a week. 10/18/19   Dustin Hones, MD  finasteride (PROSCAR) 5 MG tablet TAKE ONE TABLET EVERY DAY 07/31/19   [facility], Dustin E, MD  furosemide (LASIX) 40 MG tablet Place 1 tablet (40 mg total) into feeding tube daily. 10/17/19   Dustin Hones, MD  insulin aspart (NOVOLOG) 100 UNIT/ML injection Inject 4 Units into Dustin skin 3 (three) times daily with meals. 10/16/19   Dustin Hones, MD  lidocaine (LIDODERM) 5 % Place 1 patch onto Dustin skin daily. Remove & Discard patch within 12 hours or as directed by MD 10/16/19   Dustin Hones, MD  losartan (COZAAR) 100 MG tablet TAKE ONE TABLET EVERY DAY 07/31/19   [facility], Dustin E, MD  Nutritional Supplements (FEEDING  SUPPLEMENT, JEVITY 1.5 CAL/FIBER,) LIQD Place 240 mLs into feeding tube 6 (six) times daily. 10/16/19   Dustin Hones, MD  Polyethyl Glycol-Propyl Glycol (SYSTANE) 0.4-0.3 % GEL ophthalmic gel Place 1 application into both eyes.    [provider]  Psyllium (METAMUCIL FIBER PO) Take 2 Scoops by mouth daily.    [provider]  sodium chloride flush (NS) 0.9 % SOLN 10-40 mLs by Intracatheter route every 12 (twelve) hours. 10/16/19   Dustin Hones, MD  tamsulosin (FLOMAX) 0.4 MG CAPS capsule TAKE 2 CAPSULES EVERY DAY 07/31/19   Harrison, Dustin E, MD  timolol (TIMOPTIC) 0.5 % ophthalmic solution Place 1 drop into both eyes 2 (two) times daily.  10/30/11   [provider]  Water For Irrigation, Sterile (FREE WATER) SOLN Place 120 mLs into feeding tube 6 (six) times daily. 10/16/19   Dustin Hones, MD    Review of Systems    He denies chest pain, palpitations, dyspnea, pnd, orthopnea, n, v, dizziness,, edema, weight  gain, or early satiety. He reports recent syncope as above with LOC ~10 seconds; however, this has not recurred x3 days.   All other systems reviewed and are otherwise negative except as noted above.  Physical Exam    VS:  BP 127/80 (BP Location: Left Arm, Patient Position: Sitting, Cuff Size: Normal)   Pulse (!) 54   Ht 5\' 11"  (1.803 m)   Wt 179 lb 4 oz (81.3 kg)   SpO2 94%   BMI 25.00 kg/m  , BMI Body mass index is 25 kg/m. GEN: Elderly male, in no acute distress. Joined by daughter. HEENT: normal. Neck: Supple, no JVD, carotid bruits, or masses. Cardiac: bradycardic but regular, 1/6 systolic murmur. No rubs, or gallops. No clubbing, cyanosis, edema.  Radials/DP/PT 2+ and equal bilaterally.  Respiratory:  Respirations regular and unlabored, clear to auscultation bilaterally. GI: Soft, nontender, nondistended, BS + x 4. MS: no deformity or atrophy. Skin: warm and dry, no rash. Neuro:  Strength and sensation are intact. Psych: Normal affect.  Accessory  Clinical Findings    ECG personally reviewed by me today - SB, 54bpm, rate decrease by 24bpm and new ST/T changes noted in Dustin inferior and anterolateral leads when compared with prior tracing. TWI and ST changes in I, II, II, avL, aVF, V-V6.  VITALS Reviewed today   Temp Readings from Last 3 Encounters:  10/16/19 98.8 F (37.1 C) (Oral)  05/25/19 99.4 F (37.4 C) (Temporal)  05/18/18 98.3 F (36.8 C) (Oral)   BP Readings from Last 3 Encounters:  11/26/19 127/80  10/16/19 118/62  05/25/19 120/80   Pulse Readings from Last 3 Encounters:  11/26/19 (!) 54  10/16/19 79  05/25/19 (!) 49    Wt Readings from Last 3 Encounters:  11/26/19 179 lb 4 oz (81.3 kg)  10/11/19 192 lb 0.3 oz (87.1 kg)  05/25/19 199 lb (90.3 kg)     LABS  reviewed today   Lab Results  Component Value Date   WBC 16.9 (H) 10/15/2019   HGB 13.3 10/15/2019   HCT 38.8 (L) 10/15/2019   MCV 90.0 10/15/2019   PLT 215 10/15/2019   Lab Results  Component Value Date   CREATININE 1.20 10/16/2019   BUN 36 (H) 10/16/2019   NA 137 10/16/2019   K 4.2 10/16/2019   CL 104 10/16/2019   CO2 28 10/16/2019   Lab Results  Component Value Date   ALT 40 10/11/2019   AST 32 10/11/2019   ALKPHOS 86 10/11/2019   BILITOT 1.1 10/11/2019   Lab Results  Component Value Date   CHOL 173 11/09/2018   HDL 86.10 11/09/2018   LDLCALC 77 11/09/2018   LDLDIRECT 126.2 04/05/2013   TRIG 47.0 11/09/2018   CHOLHDL 2 11/09/2018    Lab Results  Component Value Date   HGBA1C 6.8 (H) 10/07/2019   Lab Results  Component Value Date   TSH 2.44 04/05/2013     STUDIES/PROCEDURES reviewed today   TTE 10/08/19 1. Left ventricular ejection fraction, by estimation, is 25 to 30%. Dustin  left ventricle has severely decreased function. Dustin left ventricle has no  regional wall motion abnormalities. There is moderate left ventricular  hypertrophy. Left ventricular  diastolic parameters are consistent with Grade I diastolic  dysfunction  (impaired relaxation). There is severe akinesis of Dustin left ventricular,  mid-apical anteroseptal wall, anterolateral wall, anterior segment, apical  segment and inferoseptal wall. Wall  motion abnormalities are suggestive of stress induced cardiomyopathy vs.  LAD infarct.  2.  Right ventricular systolic function is normal. Dustin right ventricular  size is normal. There is mildly elevated pulmonary artery systolic  pressure.  3. Left atrial size was mildly dilated.  4. Dustin mitral valve is normal in structure. No evidence of mitral valve  regurgitation. No evidence of mitral stenosis.  5. Dustin aortic valve is normal in structure. Aortic valve regurgitation is  not visualized. Mild to moderate aortic valve sclerosis/calcification is  present, without any evidence of aortic stenosis.  6. Dustin inferior vena cava is normal in size with greater than 50%  respiratory variability, suggesting right atrial pressure of 3 mmHg.  7. No clear vegetations but suboptimal study.   TEE 10/12/19 1. Left ventricular ejection fraction, by estimation, is 25 to 30%. Dustin  left ventricle has severely decreased function. Dustin left ventricle  demonstrates global hypokinesis.  2. Right ventricular systolic function is normal. Dustin right ventricular  size is normal. Tricuspid regurgitation signal is inadequate for assessing  PA pressure.  3. Dustin mitral valve is normal in structure. No evidence of mitral valve  regurgitation.  4. Aortic valve regurgitation is not visualized. Severe calcification of  aortic valve. Visually, there appears to be moderate aortic valve  stenosis.  5. Grossly, no valve vegetation noted.    Assessment & Plan    Recent Syncope --No syncope within Dustin last 3 days. Reports several episodes of syncope since discharge with estimated LOC approximately 10 seconds. No noted preceding sx before syncopal episodes, including CP or SOB. No current angina today. He reports he  feels much improved from his recent admission. --New EKG changes noted today and as outlined above and concerning for ischemia and with recent syncope, as well as with consideration of recently noted reduced EF of 25-30% / severe akinesis as above in CV studies. Considered also was his recent bacteremia / illness in Dustin setting of moderate AS; however, he is afebrile today and not tachycardic or hypotensive.  Moreover, this would not explain his new EKG changes and etiology of reduced EF still unclear. Noted also is bradycardic rate today, though he reports he is asx today with HR 54bpm.  --Recommended is further ischemic workup with LHC as below and with patient agreeable. Will defer from escalation of GDMT given recent syncope and pending LHC and updated labs. Reassess at RTC and after LHC. He will call 911 if CP, further syncope, or new sx after this visit but before his catheterization.    Recently diagnosed HFrEF, unclear etiology --As previously noted, EF 25-30% on TEE/TTE and reduced EF of unclear etiology. Considered during admission was stress induced CM versus LAD infarct. --Given new EKG changes concerning for ischemia, recent syncope, and reduced EF of unclear etiology, recommendation is for further ischemic workup with LHC before Dustin end of Dustin week. This was discussed with [facility]in DOD.  --Euvolemic on exam. --Scheduled for LHC with Dr. Saunders Revel 11/28/19. Risks and benefits of cardiac catheterization have been discussed with Dustin patient.  These include bleeding, infection, kidney damage, stroke, heart attack, death.  Dustin patient understands these risks and is willing to proceed. Ordered CBC and BMET. Continue current Coreg, Losartan, and Lasix with statin. Will defer escalation of GDMT at this time given recent syncope and pending updated labs and LHC results.  --At RTC, consider transition of Losartan to [facility] and addition of spironolactone as BP and renal  function/electrolytes allow.  Reviewed CHF  education. Follow-up as an outpatient after LHC with further recommendations at that time.  Bradycardia --  As above, bradycardic today. On review of vitals, he has been bradycardic before in Dustin past. BP today is stable, and he reports he is asx. He will call [facility] ED if recurrent syncope or new sx. .Will defer escalation of GDMT as above given recent syncope and bradycardia today, as well as pending labs and LHC results. Obtain BMET and CBC.  Reassess at RTC.  Recent GBS bacteremia --TEE without evidence of vegetation 10/12/19.Finished with IV abx per ID/IM. Considered that recent infection could be contributing to syncope; however, given EKG changes, LHC recommended as above.  Recent AKI --As above, will repeat BMET. Defer escalation of GDMT pending BMET and at RTC as BP/HR/sx allow.   HLD --Continue statin. Total cholesterol has improved from previous 254 to most recent 173 with LDL 77. Consider up-titration of statin at RTC.    Disposition: RTC with Dr. Rockey Situ or APP following 11/28/19 LHC with Dr. Elon Jester, PA-C 11/26/2019

## 2019-11-25 NOTE — H&P (View-Only) (Signed)
Office Visit    Patient Name: Dustin Harrison Date of Encounter: 11/26/2019  Primary Care Provider:  Jinny Sanders, MD Primary Cardiologist:  Ida Rogue, MD  Chief Complaint    Chief Complaint  Patient presents with  . OTHER    Syncopal spells. Meds reviewed verbally with pt.    86 yo male with history of HFrEF (25-30%), bradycardia, recent syncope as below, h/o pheochromocytoma, R adrenal tumor s/p R adrenalectomy in 2006, cochlear implant on the left side 06/2018, DM2, chronic back pain for 20 years, recent bacteremia s/p TEE and s/p IV abx, CKD, and who is being seen today for the evaluation of recent syncope without recurrence x3 days.   Past Medical History    Past Medical History:  Diagnosis Date  . Abnormal glucose   . Arthritis   . Baker's cyst of knee    left  . BPH (benign prostatic hypertrophy)   . Cough    because of "tight" esophagus  . Diabetes mellitus   . Glaucoma   . HOH (hard of hearing)   . Hypertension   . Pheochromocytoma of right adrenal gland   . Ulcer   . Wears hearing aid    bilateral   Past Surgical History:  Procedure Laterality Date  . adrenaletomy    . CATARACT EXTRACTION W/PHACO Left 10/08/2015   Procedure: CATARACT EXTRACTION PHACO AND INTRAOCULAR LENS PLACEMENT ([place]) left eye;  Surgeon: Leandrew Koyanagi, MD;  Location: [facility];  Service: Ophthalmology;  Laterality: Left;  MALYUGIN  . HERNIA REPAIR    . SQUAMOUS CELL CARCINOMA EXCISION  03-2006   left ear  . TEE WITHOUT CARDIOVERSION N/A 10/12/2019   Procedure: TRANSESOPHAGEAL ECHOCARDIOGRAM (TEE);  Surgeon: Minna Merritts, MD;  Location: [facility];  Service: Cardiovascular;  Laterality: N/A;  . TONSILLECTOMY    . XI ROBOTIC LAPAROSCOPIC ASSISTED APPENDECTOMY N/A 10/11/2019   Procedure: XI ROBOTIC ASSISTED LAPAROSCOPIC INSERTION GASTROSTOMY TUBE;  Surgeon: Jules Husbands, MD;  Location: [facility];  Service: General;  Laterality: N/A;    Allergies  No Known  Allergies  History of Present Illness    Dustin Harrison is a 86 y.o. male with PMH as above. In the past, he was reportedly very active and typically would walk on a daily basis with his wife, as well as golf regularly. He reportedly ambulated with a cane on a daily basis.   He was seen at [facility] by [facility] 10/09/19 after admission and positive cultures for Group B strep bacteremia. The source of infection was unable to be identified. BMET showed AKI with renal function improved by discharge. CBC showed thrombocytopenia, resolved by discharge. TTE was performed and showed significantly reduced EF 25-35% (no previous study), G1DD, and severe akinesis of the L ventricular med apical anteroseptal wall, anterolateral wall, anterior segment, apical segment, and anteroseptal wall with images to suggest stress induced CM versus LAD infarct. Recommendation was therefore for further ischemic workup after discharge. Given his bacteremia, TEE was also performed and without vegetation / evidence of endocarditis. TEE did, however, show moderate AS not visualized on TTE. Given his bacteremia, he was continued on Rocephin with last dose estimated 5/31, at which time his PICC line would be removed.. Given dysphagia experienced during his admission, he was seen by ENT with dx of R vocal cord paralysis and started on feeding tube, which was continued at discharge with recommendation for speech therapy and ENT follow-up. It was noted there was a 2.2cm  intraluminal density in the duodenum that would require future EGD workup / GI follow-up. He was unfortunately denied acute rehab for R sided weakness and instead discharged to [facility].  At discharge, he was continued on Coreg, Losartan, and Lasix with recommendation to taper off of clonidine patch with further escalation of GDMT. At follow-up, it was recommended that spironolactone be added if possible. Again, he was continued on IV abx.  He reports today that he  returned home for father's day yesterday, and he has been feeling well since discharge. He notes, however, recent syncopal episodes with LOC approximately 10 seconds per daughter. No preceding sx. Fortunately, he has not had any syncope in at least 3 days, however. The syncopal episodes occurred while ambulating, per patient. He denies any CP, racing HR, palpitations, n/v, orthopnea, early satiety, or abdominal distention.  No s/sx of bleeding. He reports medication compliance.   Of note, his wife has also recently been seen at [facility] with plans for catheterization.  Home Medications    Prior to Admission medications   Medication Sig Start Date End Date Taking? Authorizing Provider  acetaminophen (TYLENOL) 500 MG tablet Take 500 mg by mouth daily as needed.    [provider]  Amino Acids-Protein Hydrolys (FEEDING SUPPLEMENT, PRO-STAT SUGAR FREE 64,) LIQD Place 30 mLs into feeding tube daily. 10/17/19   Sharen Hones, MD  atorvastatin (LIPITOR) 20 MG tablet TAKE ONE TABLET EVERY DAY 07/31/19   Bedsole, Amy E, MD  carvedilol (COREG) 6.25 MG tablet Take 1 tablet (6.25 mg total) by mouth 2 (two) times daily with a meal. 10/16/19   Sharen Hones, MD  cloNIDine (CATAPRES - DOSED IN MG/24 HR) 0.1 mg/24hr patch Place 1 patch (0.1 mg total) onto the skin once a week. 10/18/19   Sharen Hones, MD  finasteride (PROSCAR) 5 MG tablet TAKE ONE TABLET EVERY DAY 07/31/19   Bedsole, Amy E, MD  furosemide (LASIX) 40 MG tablet Place 1 tablet (40 mg total) into feeding tube daily. 10/17/19   Sharen Hones, MD  insulin aspart (NOVOLOG) 100 UNIT/ML injection Inject 4 Units into the skin 3 (three) times daily with meals. 10/16/19   Sharen Hones, MD  lidocaine (LIDODERM) 5 % Place 1 patch onto the skin daily. Remove & Discard patch within 12 hours or as directed by MD 10/16/19   Sharen Hones, MD  losartan (COZAAR) 100 MG tablet TAKE ONE TABLET EVERY DAY 07/31/19   Bedsole, Amy E, MD  Nutritional Supplements (FEEDING  SUPPLEMENT, JEVITY 1.5 CAL/FIBER,) LIQD Place 240 mLs into feeding tube 6 (six) times daily. 10/16/19   Sharen Hones, MD  Polyethyl Glycol-Propyl Glycol (SYSTANE) 0.4-0.3 % GEL ophthalmic gel Place 1 application into both eyes.    [provider]  Psyllium (METAMUCIL FIBER PO) Take 2 Scoops by mouth daily.    [provider]  sodium chloride flush (NS) 0.9 % SOLN 10-40 mLs by Intracatheter route every 12 (twelve) hours. 10/16/19   Sharen Hones, MD  tamsulosin (FLOMAX) 0.4 MG CAPS capsule TAKE 2 CAPSULES EVERY DAY 07/31/19   Bedsole, Amy E, MD  timolol (TIMOPTIC) 0.5 % ophthalmic solution Place 1 drop into both eyes 2 (two) times daily.  10/30/11   [provider]  Water For Irrigation, Sterile (FREE WATER) SOLN Place 120 mLs into feeding tube 6 (six) times daily. 10/16/19   Sharen Hones, MD    Review of Systems    He denies chest pain, palpitations, dyspnea, pnd, orthopnea, n, v, dizziness,, edema, weight  gain, or early satiety. He reports recent syncope as above with LOC ~10 seconds; however, this has not recurred x3 days.   All other systems reviewed and are otherwise negative except as noted above.  Physical Exam    VS:  BP 127/80 (BP Location: Left Arm, Patient Position: Sitting, Cuff Size: Normal)   Pulse (!) 54   Ht 5\' 11"  (1.803 m)   Wt 179 lb 4 oz (81.3 kg)   SpO2 94%   BMI 25.00 kg/m  , BMI Body mass index is 25 kg/m. GEN: Elderly male, in no acute distress. Joined by daughter. HEENT: normal. Neck: Supple, no JVD, carotid bruits, or masses. Cardiac: bradycardic but regular, 1/6 systolic murmur. No rubs, or gallops. No clubbing, cyanosis, edema.  Radials/DP/PT 2+ and equal bilaterally.  Respiratory:  Respirations regular and unlabored, clear to auscultation bilaterally. GI: Soft, nontender, nondistended, BS + x 4. MS: no deformity or atrophy. Skin: warm and dry, no rash. Neuro:  Strength and sensation are intact. Psych: Normal affect.  Accessory  Clinical Findings    ECG personally reviewed by me today - SB, 54bpm, rate decrease by 24bpm and new ST/T changes noted in the inferior and anterolateral leads when compared with prior tracing. TWI and ST changes in I, II, II, avL, aVF, V-V6.  VITALS Reviewed today   Temp Readings from Last 3 Encounters:  10/16/19 98.8 F (37.1 C) (Oral)  05/25/19 99.4 F (37.4 C) (Temporal)  05/18/18 98.3 F (36.8 C) (Oral)   BP Readings from Last 3 Encounters:  11/26/19 127/80  10/16/19 118/62  05/25/19 120/80   Pulse Readings from Last 3 Encounters:  11/26/19 (!) 54  10/16/19 79  05/25/19 (!) 49    Wt Readings from Last 3 Encounters:  11/26/19 179 lb 4 oz (81.3 kg)  10/11/19 192 lb 0.3 oz (87.1 kg)  05/25/19 199 lb (90.3 kg)     LABS  reviewed today   Lab Results  Component Value Date   WBC 16.9 (H) 10/15/2019   HGB 13.3 10/15/2019   HCT 38.8 (L) 10/15/2019   MCV 90.0 10/15/2019   PLT 215 10/15/2019   Lab Results  Component Value Date   CREATININE 1.20 10/16/2019   BUN 36 (H) 10/16/2019   NA 137 10/16/2019   K 4.2 10/16/2019   CL 104 10/16/2019   CO2 28 10/16/2019   Lab Results  Component Value Date   ALT 40 10/11/2019   AST 32 10/11/2019   ALKPHOS 86 10/11/2019   BILITOT 1.1 10/11/2019   Lab Results  Component Value Date   CHOL 173 11/09/2018   HDL 86.10 11/09/2018   LDLCALC 77 11/09/2018   LDLDIRECT 126.2 04/05/2013   TRIG 47.0 11/09/2018   CHOLHDL 2 11/09/2018    Lab Results  Component Value Date   HGBA1C 6.8 (H) 10/07/2019   Lab Results  Component Value Date   TSH 2.44 04/05/2013     STUDIES/PROCEDURES reviewed today   TTE 10/08/19 1. Left ventricular ejection fraction, by estimation, is 25 to 30%. The  left ventricle has severely decreased function. The left ventricle has no  regional wall motion abnormalities. There is moderate left ventricular  hypertrophy. Left ventricular  diastolic parameters are consistent with Grade I diastolic  dysfunction  (impaired relaxation). There is severe akinesis of the left ventricular,  mid-apical anteroseptal wall, anterolateral wall, anterior segment, apical  segment and inferoseptal wall. Wall  motion abnormalities are suggestive of stress induced cardiomyopathy vs.  LAD infarct.  2.  Right ventricular systolic function is normal. The right ventricular  size is normal. There is mildly elevated pulmonary artery systolic  pressure.  3. Left atrial size was mildly dilated.  4. The mitral valve is normal in structure. No evidence of mitral valve  regurgitation. No evidence of mitral stenosis.  5. The aortic valve is normal in structure. Aortic valve regurgitation is  not visualized. Mild to moderate aortic valve sclerosis/calcification is  present, without any evidence of aortic stenosis.  6. The inferior vena cava is normal in size with greater than 50%  respiratory variability, suggesting right atrial pressure of 3 mmHg.  7. No clear vegetations but suboptimal study.   TEE 10/12/19 1. Left ventricular ejection fraction, by estimation, is 25 to 30%. The  left ventricle has severely decreased function. The left ventricle  demonstrates global hypokinesis.  2. Right ventricular systolic function is normal. The right ventricular  size is normal. Tricuspid regurgitation signal is inadequate for assessing  PA pressure.  3. The mitral valve is normal in structure. No evidence of mitral valve  regurgitation.  4. Aortic valve regurgitation is not visualized. Severe calcification of  aortic valve. Visually, there appears to be moderate aortic valve  stenosis.  5. Grossly, no valve vegetation noted.    Assessment & Plan    Recent Syncope --No syncope within the last 3 days. Reports several episodes of syncope since discharge with estimated LOC approximately 10 seconds. No noted preceding sx before syncopal episodes, including CP or SOB. No current angina today. He reports he  feels much improved from his recent admission. --New EKG changes noted today and as outlined above and concerning for ischemia and with recent syncope, as well as with consideration of recently noted reduced EF of 25-30% / severe akinesis as above in CV studies. Considered also was his recent bacteremia / illness in the setting of moderate AS; however, he is afebrile today and not tachycardic or hypotensive.  Moreover, this would not explain his new EKG changes and etiology of reduced EF still unclear. Noted also is bradycardic rate today, though he reports he is asx today with HR 54bpm.  --Recommended is further ischemic workup with LHC as below and with patient agreeable. Will defer from escalation of GDMT given recent syncope and pending LHC and updated labs. Reassess at RTC and after LHC. He will call 911 if CP, further syncope, or new sx after this visit but before his catheterization.    Recently diagnosed HFrEF, unclear etiology --As previously noted, EF 25-30% on TEE/TTE and reduced EF of unclear etiology. Considered during admission was stress induced CM versus LAD infarct. --Given new EKG changes concerning for ischemia, recent syncope, and reduced EF of unclear etiology, recommendation is for further ischemic workup with LHC before the end of the week. This was discussed with the DOD.  --Euvolemic on exam. --Scheduled for LHC with Dr. Saunders Revel 11/28/19. Risks and benefits of cardiac catheterization have been discussed with the patient.  These include bleeding, infection, kidney damage, stroke, heart attack, death.  The patient understands these risks and is willing to proceed. Ordered CBC and BMET. Continue current Coreg, Losartan, and Lasix with statin. Will defer escalation of GDMT at this time given recent syncope and pending updated labs and LHC results.  --At RTC, consider transition of Losartan to [facility] and addition of spironolactone as BP and renal  function/electrolytes allow.  Reviewed CHF  education. Follow-up as an outpatient after LHC with further recommendations at that time.  Bradycardia --  As above, bradycardic today. On review of vitals, he has been bradycardic before in the past. BP today is stable, and he reports he is asx. He will call the ED if recurrent syncope or new sx. .Will defer escalation of GDMT as above given recent syncope and bradycardia today, as well as pending labs and LHC results. Obtain BMET and CBC.  Reassess at RTC.  Recent GBS bacteremia --TEE without evidence of vegetation 10/12/19.Finished with IV abx per ID/IM. Considered that recent infection could be contributing to syncope; however, given EKG changes, LHC recommended as above.  Recent AKI --As above, will repeat BMET. Defer escalation of GDMT pending BMET and at RTC as BP/HR/sx allow.   HLD --Continue statin. Total cholesterol has improved from previous 254 to most recent 173 with LDL 77. Consider up-titration of statin at RTC.    Disposition: RTC with Dr. Rockey Situ or APP following 11/28/19 LHC with Dr. Elon Jester, PA-C 11/26/2019

## 2019-11-26 ENCOUNTER — Encounter: Payer: Self-pay | Admitting: Physician Assistant

## 2019-11-26 ENCOUNTER — Telehealth: Payer: Self-pay | Admitting: Physician Assistant

## 2019-11-26 ENCOUNTER — Other Ambulatory Visit: Payer: Self-pay | Admitting: Family Medicine

## 2019-11-26 ENCOUNTER — Other Ambulatory Visit: Payer: Self-pay

## 2019-11-26 ENCOUNTER — Ambulatory Visit: Payer: Medicare PPO | Admitting: Physician Assistant

## 2019-11-26 VITALS — BP 127/80 | HR 54 | Ht 71.0 in | Wt 179.2 lb

## 2019-11-26 DIAGNOSIS — Z86018 Personal history of other benign neoplasm: Secondary | ICD-10-CM | POA: Diagnosis not present

## 2019-11-26 DIAGNOSIS — R9431 Abnormal electrocardiogram [ECG] [EKG]: Secondary | ICD-10-CM

## 2019-11-26 DIAGNOSIS — I5022 Chronic systolic (congestive) heart failure: Secondary | ICD-10-CM

## 2019-11-26 DIAGNOSIS — E1169 Type 2 diabetes mellitus with other specified complication: Secondary | ICD-10-CM | POA: Diagnosis not present

## 2019-11-26 DIAGNOSIS — I10 Essential (primary) hypertension: Secondary | ICD-10-CM | POA: Diagnosis not present

## 2019-11-26 DIAGNOSIS — R001 Bradycardia, unspecified: Secondary | ICD-10-CM

## 2019-11-26 DIAGNOSIS — I272 Pulmonary hypertension, unspecified: Secondary | ICD-10-CM | POA: Diagnosis not present

## 2019-11-26 DIAGNOSIS — N182 Chronic kidney disease, stage 2 (mild): Secondary | ICD-10-CM | POA: Diagnosis not present

## 2019-11-26 DIAGNOSIS — R55 Syncope and collapse: Secondary | ICD-10-CM | POA: Diagnosis not present

## 2019-11-26 DIAGNOSIS — Z87898 Personal history of other specified conditions: Secondary | ICD-10-CM

## 2019-11-26 DIAGNOSIS — I350 Nonrheumatic aortic (valve) stenosis: Secondary | ICD-10-CM

## 2019-11-26 NOTE — Telephone Encounter (Signed)
Returned call to daughter Zigmund Daniel. Made her aware of cath for her dad this Wednesday 23rd. Covid test tomorrow before 10:30 AM>   No further questions at this time.

## 2019-11-26 NOTE — Patient Instructions (Signed)
Medication Instructions:  Your physician recommends that you continue on your current medications as directed. Please refer to the Current Medication list given to you today.  *If you need a refill on your cardiac medications before your next appointment, please call your pharmacy*   Lab Work: 1- Your physician recommends that you have lab work today(BMET, CBC)   2- CV19 Pre admit testing DRIVE THRU  Please report to the PAT testing site (medical arts building) on _________ date ____8 AM-12 AM_____ time for your DRIVE THRU covid testing that is required prior to your procedure.  Following covid testing, please remain in quarantine. If you must be around others, please wash hands, avoid touching face and wear your mask.   If you have labs (blood work) drawn today and your tests are completely normal, you will receive your results only by: Marland Kitchen MyChart Message (if you have MyChart) OR . A paper copy in the mail If you have any lab test that is abnormal or we need to change your treatment, we will call you to review the results.   Testing/Procedures: 1-    [address], [place] [place] [address] Dept: [phone] Loc: [place]  11/26/2019  You are scheduled for a Cardiac Catheterization on ___________ with Dr.__________  1. Please arrive at the medical mall of [facility] @ ___________ (This time is 1 hour before your procedure to ensure your preparation). Free valet parking service is available.   Special note: Every effort is made to have your procedure done on time. Please understand that emergencies sometimes delay scheduled procedures.  2. Diet: Do not eat solid foods after midnight.  The patient may have clear liquids until 5am upon the day of the procedure.  3. Labs: today 4. Medication instructions in preparation for your procedure:  Do not take Diabetes  Med Glucophage (Metformin) on the day of the procedure and HOLD 48 HOURS AFTER THE PROCEDURE.  On the morning of your procedure, take your Aspirin and any morning medicines NOT listed above.  You may use sips of water.  5. Plan for one night stay--bring personal belongings. 6. Bring a current list of your medications and current insurance cards. 7. You MUST have a responsible person to drive you home. 8. Someone MUST be with you the first 24 hours after you arrive home or your discharge will be delayed. 9. Please wear clothes that are easy to get on and off and wear slip-on shoes.  Thank you for allowing [place] to care for you!   -- [facility]    Follow-Up: At [facility], you and your health needs are our priority.  As part of our continuing mission to provide you with exceptional heart care, we have created designated Provider Care Teams.  These Care Teams include your primary Cardiologist (physician) and Advanced Practice Providers (APPs -  Physician Assistants and Nurse Practitioners) who all work together to provide you with the care you need, when you need it.  We recommend signing up for the patient portal called "MyChart".  Sign up information is provided on this After Visit Summary.  MyChart is used to connect with patients for Virtual Visits (Telemedicine).  Patients are able to view lab/test results, encounter notes, upcoming appointments, etc.  Non-urgent messages can be sent to your provider as well.   To learn more about what you can do with MyChart, go to  [email or web address].    Your next appointment:   2 week(s)  The format for your next appointment:   In Person  Provider:    You may see Ida Rogue, MD or one of the following Advanced Practice Providers on your designated Care Team:    Murray Hodgkins, NP  Christell Faith, PA-C  Marrianne Mood, PA-C

## 2019-11-26 NOTE — Telephone Encounter (Signed)
Patient daughter called, states patient was seen by Marrianne Mood, PA, which lives out of town, she asks if patient could have cath the same day as pt wife on July 1. Please call to discuss

## 2019-11-27 ENCOUNTER — Other Ambulatory Visit
Admission: RE | Admit: 2019-11-27 | Discharge: 2019-11-27 | Disposition: A | Discharge status: Home / Self Care | Payer: Medicare PPO | Source: Ambulatory Visit | Attending: Internal Medicine | Admitting: Internal Medicine

## 2019-11-27 DIAGNOSIS — Z01812 Encounter for preprocedural laboratory examination: Secondary | ICD-10-CM | POA: Insufficient documentation

## 2019-11-27 DIAGNOSIS — Z20822 Contact with and (suspected) exposure to covid-19: Secondary | ICD-10-CM | POA: Insufficient documentation

## 2019-11-27 LAB — CBC
Hematocrit: 35.3 % — ABNORMAL LOW (ref 37.5–51.0)
Hemoglobin: 12 g/dL — ABNORMAL LOW (ref 13.0–17.7)
MCH: 30.4 pg (ref 26.6–33.0)
MCHC: 34 g/dL (ref 31.5–35.7)
MCV: 89 fL (ref 79–97)
Platelets: 497 10*3/uL — ABNORMAL HIGH (ref 150–450)
RBC: 3.95 x10E6/uL — ABNORMAL LOW (ref 4.14–5.80)
RDW: 12.5 % (ref 11.6–15.4)
WBC: 12.2 10*3/uL — ABNORMAL HIGH (ref 3.4–10.8)

## 2019-11-27 LAB — BASIC METABOLIC PANEL
BUN/Creatinine Ratio: 35 — ABNORMAL HIGH (ref 10–24)
BUN: 44 mg/dL — ABNORMAL HIGH (ref 8–27)
CO2: 18 mmol/L — ABNORMAL LOW (ref 20–29)
Calcium: 9.5 mg/dL (ref 8.6–10.2)
Chloride: 102 mmol/L (ref 96–106)
Creatinine, Ser: 1.26 mg/dL (ref 0.76–1.27)
GFR calc Af Amer: 60 mL/min/{1.73_m2} (ref >59)
GFR calc non Af Amer: 52 mL/min/{1.73_m2} — ABNORMAL LOW (ref >59)
Glucose: 139 mg/dL — ABNORMAL HIGH (ref 65–99)
Potassium: 5.9 mmol/L — ABNORMAL HIGH (ref 3.5–5.2)
Sodium: 140 mmol/L (ref 134–144)

## 2019-11-27 LAB — SARS CORONAVIRUS 2 (TAT 6-24 HRS): SARS Coronavirus 2: NEGATIVE

## 2019-11-28 ENCOUNTER — Encounter
Admission: RE | Disposition: A | Discharge status: Home / Self Care | Payer: Self-pay | Source: Ambulatory Visit | Attending: Internal Medicine

## 2019-11-28 ENCOUNTER — Ambulatory Visit
Admission: RE | Admit: 2019-11-28 | Discharge: 2019-11-28 | Disposition: A | Discharge status: Home / Self Care | Payer: Medicare PPO | Source: Ambulatory Visit | Attending: Internal Medicine | Admitting: Internal Medicine

## 2019-11-28 ENCOUNTER — Other Ambulatory Visit: Payer: Self-pay | Admitting: *Deleted

## 2019-11-28 ENCOUNTER — Encounter: Payer: Self-pay | Admitting: Internal Medicine

## 2019-11-28 ENCOUNTER — Other Ambulatory Visit: Payer: Self-pay

## 2019-11-28 DIAGNOSIS — N400 Enlarged prostate without lower urinary tract symptoms: Secondary | ICD-10-CM | POA: Diagnosis not present

## 2019-11-28 DIAGNOSIS — E785 Hyperlipidemia, unspecified: Secondary | ICD-10-CM | POA: Diagnosis not present

## 2019-11-28 DIAGNOSIS — R079 Chest pain, unspecified: Secondary | ICD-10-CM | POA: Diagnosis present

## 2019-11-28 DIAGNOSIS — I5022 Chronic systolic (congestive) heart failure: Secondary | ICD-10-CM | POA: Diagnosis not present

## 2019-11-28 DIAGNOSIS — E1122 Type 2 diabetes mellitus with diabetic chronic kidney disease: Secondary | ICD-10-CM | POA: Diagnosis not present

## 2019-11-28 DIAGNOSIS — I2510 Atherosclerotic heart disease of native coronary artery without angina pectoris: Secondary | ICD-10-CM

## 2019-11-28 DIAGNOSIS — R55 Syncope and collapse: Secondary | ICD-10-CM | POA: Diagnosis not present

## 2019-11-28 DIAGNOSIS — H409 Unspecified glaucoma: Secondary | ICD-10-CM | POA: Diagnosis not present

## 2019-11-28 DIAGNOSIS — I5021 Acute systolic (congestive) heart failure: Secondary | ICD-10-CM | POA: Diagnosis not present

## 2019-11-28 DIAGNOSIS — Z79899 Other long term (current) drug therapy: Secondary | ICD-10-CM | POA: Diagnosis not present

## 2019-11-28 DIAGNOSIS — I350 Nonrheumatic aortic (valve) stenosis: Secondary | ICD-10-CM | POA: Insufficient documentation

## 2019-11-28 DIAGNOSIS — N189 Chronic kidney disease, unspecified: Secondary | ICD-10-CM | POA: Diagnosis not present

## 2019-11-28 DIAGNOSIS — Z8619 Personal history of other infectious and parasitic diseases: Secondary | ICD-10-CM | POA: Insufficient documentation

## 2019-11-28 DIAGNOSIS — I130 Hypertensive heart and chronic kidney disease with heart failure and stage 1 through stage 4 chronic kidney disease, or unspecified chronic kidney disease: Secondary | ICD-10-CM | POA: Diagnosis not present

## 2019-11-28 DIAGNOSIS — M1990 Unspecified osteoarthritis, unspecified site: Secondary | ICD-10-CM | POA: Diagnosis not present

## 2019-11-28 DIAGNOSIS — R001 Bradycardia, unspecified: Secondary | ICD-10-CM | POA: Diagnosis not present

## 2019-11-28 DIAGNOSIS — Z794 Long term (current) use of insulin: Secondary | ICD-10-CM | POA: Diagnosis not present

## 2019-11-28 DIAGNOSIS — N179 Acute kidney failure, unspecified: Secondary | ICD-10-CM | POA: Insufficient documentation

## 2019-11-28 HISTORY — PX: RIGHT/LEFT HEART CATH AND CORONARY ANGIOGRAPHY: CATH118266

## 2019-11-28 HISTORY — DX: Atherosclerotic heart disease of native coronary artery without angina pectoris: I25.10

## 2019-11-28 LAB — BASIC METABOLIC PANEL
Anion gap: 8 (ref 5–15)
BUN: 44 mg/dL — ABNORMAL HIGH (ref 8–23)
CO2: 26 mmol/L (ref 22–32)
Calcium: 9.2 mg/dL (ref 8.9–10.3)
Chloride: 104 mmol/L (ref 98–111)
Creatinine, Ser: 1.1 mg/dL (ref 0.61–1.24)
GFR calc Af Amer: >60 mL/min (ref >60)
GFR calc non Af Amer: >60 mL/min (ref >60)
Glucose, Bld: 161 mg/dL — ABNORMAL HIGH (ref 70–99)
Potassium: 4.3 mmol/L (ref 3.5–5.1)
Sodium: 138 mmol/L (ref 135–145)

## 2019-11-28 LAB — GLUCOSE, CAPILLARY: Glucose-Capillary: 153 mg/dL — ABNORMAL HIGH (ref 70–99)

## 2019-11-28 SURGERY — RIGHT/LEFT HEART CATH AND CORONARY ANGIOGRAPHY
Anesthesia: Moderate Sedation | Laterality: Left

## 2019-11-28 MED ORDER — ASPIRIN EC 81 MG PO TBEC
81.0000 mg | DELAYED_RELEASE_TABLET | Freq: Every day | ORAL | Status: DC
Start: 2019-11-28 — End: 2020-03-20

## 2019-11-28 MED ORDER — SODIUM CHLORIDE 0.9 % IV SOLN: 250.0000 mL | INTRAVENOUS | Status: DC | PRN

## 2019-11-28 MED ORDER — LIDOCAINE HCL (PF) 1 % IJ SOLN
INTRAMUSCULAR | Status: AC
  Filled 2019-11-28: qty 30

## 2019-11-28 MED ORDER — SODIUM CHLORIDE 0.9% FLUSH: 3.0000 mL | INTRAVENOUS | Status: DC | PRN

## 2019-11-28 MED ORDER — FENTANYL CITRATE (PF) 100 MCG/2ML IJ SOLN
INTRAMUSCULAR | Status: AC
  Filled 2019-11-28: qty 2

## 2019-11-28 MED ORDER — HEPARIN (PORCINE) IN NACL 1000-0.9 UT/500ML-% IV SOLN
INTRAVENOUS | Status: DC | PRN
  Administered 2019-11-28: 500 mL

## 2019-11-28 MED ORDER — MIDAZOLAM HCL 2 MG/2ML IJ SOLN
INTRAMUSCULAR | Status: AC
  Filled 2019-11-28: qty 2

## 2019-11-28 MED ORDER — FUROSEMIDE 40 MG PO TABS: 40.0000 mg | ORAL_TABLET | Freq: Every day | ORAL | Status: DC | PRN

## 2019-11-28 MED ORDER — LIDOCAINE HCL (PF) 1 % IJ SOLN
INTRAMUSCULAR | Status: DC | PRN
  Administered 2019-11-28: 5 mL

## 2019-11-28 MED ORDER — SODIUM CHLORIDE 0.9% FLUSH: 3.0000 mL | Freq: Two times a day (BID) | INTRAVENOUS | Status: DC

## 2019-11-28 MED ORDER — SODIUM CHLORIDE 0.9 % IV SOLN: INTRAVENOUS | Status: DC

## 2019-11-28 MED ORDER — LABETALOL HCL 5 MG/ML IV SOLN: 10.0000 mg | INTRAVENOUS | Status: DC | PRN

## 2019-11-28 MED ORDER — VERAPAMIL HCL 2.5 MG/ML IV SOLN
INTRAVENOUS | Status: AC
  Filled 2019-11-28: qty 2

## 2019-11-28 MED ORDER — ASPIRIN 81 MG PO CHEW: 81.0000 mg | CHEWABLE_TABLET | ORAL | Status: AC

## 2019-11-28 MED ORDER — IOHEXOL 300 MG/ML  SOLN
INTRAMUSCULAR | Status: DC | PRN
  Administered 2019-11-28: 55 mL

## 2019-11-28 MED ORDER — HEPARIN SODIUM (PORCINE) 1000 UNIT/ML IJ SOLN
INTRAMUSCULAR | Status: DC | PRN
  Administered 2019-11-28: 4000 [IU] via INTRAVENOUS

## 2019-11-28 MED ORDER — HEPARIN SODIUM (PORCINE) 1000 UNIT/ML IJ SOLN
INTRAMUSCULAR | Status: AC
  Filled 2019-11-28: qty 1

## 2019-11-28 MED ORDER — VERAPAMIL HCL 2.5 MG/ML IV SOLN
INTRAVENOUS | Status: DC | PRN
  Administered 2019-11-28: 2.5 mg via INTRA_ARTERIAL

## 2019-11-28 MED ORDER — METFORMIN HCL 500 MG PO TABS: 500.0000 mg | ORAL_TABLET | Freq: Two times a day (BID) | ORAL | 1 refills | Status: DC

## 2019-11-28 MED ORDER — ONDANSETRON HCL 4 MG/2ML IJ SOLN: 4.0000 mg | Freq: Four times a day (QID) | INTRAMUSCULAR | Status: DC | PRN

## 2019-11-28 MED ORDER — ASPIRIN 81 MG PO CHEW
CHEWABLE_TABLET | ORAL | Status: AC
  Administered 2019-11-28: 81 mg via ORAL
  Filled 2019-11-28: qty 1

## 2019-11-28 MED ORDER — ACETAMINOPHEN 325 MG PO TABS: 650.0000 mg | ORAL_TABLET | ORAL | Status: DC | PRN

## 2019-11-28 MED ORDER — METFORMIN HCL 500 MG PO TABS: 500.0000 mg | ORAL_TABLET | Freq: Two times a day (BID) | ORAL | Status: DC

## 2019-11-28 MED ORDER — HYDRALAZINE HCL 20 MG/ML IJ SOLN: 10.0000 mg | INTRAMUSCULAR | Status: DC | PRN

## 2019-11-28 MED ORDER — HEPARIN (PORCINE) IN NACL 1000-0.9 UT/500ML-% IV SOLN
INTRAVENOUS | Status: AC
  Filled 2019-11-28: qty 1000

## 2019-11-28 SURGICAL SUPPLY — 11 items
CATH BALLN WEDGE 5F 110CM (CATHETERS) ×3 IMPLANT
CATH INFINITI 5 FR JL3.5 (CATHETERS) ×3 IMPLANT
CATH INFINITI JR4 5F (CATHETERS) ×3 IMPLANT
DEVICE RAD TR BAND REGULAR (VASCULAR PRODUCTS) ×3 IMPLANT
GLIDESHEATH SLEND SS 6F .021 (SHEATH) ×3 IMPLANT
GUIDEWIRE .025 260CM (WIRE) ×3 IMPLANT
GUIDEWIRE INQWIRE 1.5J.035X260 (WIRE) ×1 IMPLANT
INQWIRE 1.5J .035X260CM (WIRE) ×3
KIT MANI 3VAL PERCEP (MISCELLANEOUS) ×3 IMPLANT
PACK CARDIAC CATH (CUSTOM PROCEDURE TRAY) ×3 IMPLANT
SHEATH GLIDE SLENDER 4/5FR (SHEATH) ×3 IMPLANT

## 2019-11-28 NOTE — Interval H&P Note (Signed)
History and Physical Interval Note:  11/28/2019 7:29 AM  Dustin Harrison  has presented today for surgery, with the diagnosis of heart failure and syncope.  The various methods of treatment have been discussed with the patient and family. After consideration of risks, benefits and other options for treatment, the patient has consented to  Procedure(s): LEFT HEART CATH AND CORONARY ANGIOGRAPHY (Left) as a surgical intervention.  We have agreed to perform a right heart catheterization as well. The patient's history has been reviewed, patient examined, no change in status, stable for surgery.  I have reviewed the patient's chart and labs.  Questions were answered to the patient's satisfaction.    Cath Lab Visit (complete for each Cath Lab visit)  Clinical Evaluation Leading to the Procedure:   ACS: No.  Non-ACS:    Anginal/Heart Failure Classification: NYHA III  Anti-ischemic medical therapy: Minimal Therapy (1 class of medications)  Non-Invasive Test Results: No non-invasive testing performed (LVEF <35% - high risk)  Prior CABG: No previous CABG  Dustin Harrison

## 2019-11-29 ENCOUNTER — Telehealth: Payer: Self-pay

## 2019-11-29 DIAGNOSIS — E1122 Type 2 diabetes mellitus with diabetic chronic kidney disease: Secondary | ICD-10-CM | POA: Diagnosis not present

## 2019-11-29 DIAGNOSIS — Z741 Need for assistance with personal care: Secondary | ICD-10-CM | POA: Diagnosis not present

## 2019-11-29 DIAGNOSIS — R7881 Bacteremia: Secondary | ICD-10-CM | POA: Diagnosis not present

## 2019-11-29 DIAGNOSIS — N179 Acute kidney failure, unspecified: Secondary | ICD-10-CM | POA: Diagnosis not present

## 2019-11-29 DIAGNOSIS — G8929 Other chronic pain: Secondary | ICD-10-CM | POA: Diagnosis not present

## 2019-11-29 DIAGNOSIS — I130 Hypertensive heart and chronic kidney disease with heart failure and stage 1 through stage 4 chronic kidney disease, or unspecified chronic kidney disease: Secondary | ICD-10-CM | POA: Diagnosis not present

## 2019-11-29 DIAGNOSIS — R278 Other lack of coordination: Secondary | ICD-10-CM | POA: Diagnosis not present

## 2019-11-29 DIAGNOSIS — M549 Dorsalgia, unspecified: Secondary | ICD-10-CM | POA: Diagnosis not present

## 2019-11-29 DIAGNOSIS — I5022 Chronic systolic (congestive) heart failure: Secondary | ICD-10-CM | POA: Diagnosis not present

## 2019-11-29 MED ORDER — PANTOPRAZOLE SODIUM 20 MG PO TBEC
20.0000 mg | DELAYED_RELEASE_TABLET | Freq: Every day | ORAL | 6 refills | Status: DC
Start: 2019-11-29 — End: 2020-06-20

## 2019-11-29 NOTE — Telephone Encounter (Signed)
-----   Message from Arvil Chaco, PA-C sent at 11/28/2019 11:23 PM EDT ----- Please let Mr. Montilla know that his initial 6/21 labs showed potassium was quite high; however, on recheck, his potassium was normal (5.9 dropped down to 4.3), which is reassuring. His initial labs most likely showed elevated potassium due to hemolysis that sometimes occurs during collection of the blood - it can make potassium seem (falsely) much higher than the actual values (and back to normal once rechecked).   His renal function is stable and even showed slight improvement from 6/21 to  recheck 6/23.   His hemoglobin is slightly reduced from his previous labs in May 2021. After his cath, recommendation was to start ASA 81mg  daily.  Let's start him on Protonix 20mg  daily, if agreeable, and given his anemia (to protect against GI bleed or stomach irritation). He should let us know if he notices any dark and tarry stools, bright red blood in the toilet, or if he is coughing up any blood on this medication.   1) Start ASA 81mg  daily 2) Start Protonix 20mg  daily, if agreeable.

## 2019-11-29 NOTE — Telephone Encounter (Signed)
Call to patient to review labs.    Pt verbalized understanding and has no further questions at this time. pt agreeable to POC.    Advised pt to call for any further questions or concerns.  All orders placed as requested.

## 2019-12-04 DIAGNOSIS — G8929 Other chronic pain: Secondary | ICD-10-CM | POA: Diagnosis not present

## 2019-12-04 DIAGNOSIS — R7881 Bacteremia: Secondary | ICD-10-CM | POA: Diagnosis not present

## 2019-12-04 DIAGNOSIS — I130 Hypertensive heart and chronic kidney disease with heart failure and stage 1 through stage 4 chronic kidney disease, or unspecified chronic kidney disease: Secondary | ICD-10-CM | POA: Diagnosis not present

## 2019-12-04 DIAGNOSIS — M549 Dorsalgia, unspecified: Secondary | ICD-10-CM | POA: Diagnosis not present

## 2019-12-04 DIAGNOSIS — E1122 Type 2 diabetes mellitus with diabetic chronic kidney disease: Secondary | ICD-10-CM | POA: Diagnosis not present

## 2019-12-04 DIAGNOSIS — Z741 Need for assistance with personal care: Secondary | ICD-10-CM | POA: Diagnosis not present

## 2019-12-04 DIAGNOSIS — N179 Acute kidney failure, unspecified: Secondary | ICD-10-CM | POA: Diagnosis not present

## 2019-12-04 DIAGNOSIS — R278 Other lack of coordination: Secondary | ICD-10-CM | POA: Diagnosis not present

## 2019-12-04 DIAGNOSIS — I5022 Chronic systolic (congestive) heart failure: Secondary | ICD-10-CM | POA: Diagnosis not present

## 2019-12-06 DIAGNOSIS — G8929 Other chronic pain: Secondary | ICD-10-CM | POA: Diagnosis not present

## 2019-12-06 DIAGNOSIS — I130 Hypertensive heart and chronic kidney disease with heart failure and stage 1 through stage 4 chronic kidney disease, or unspecified chronic kidney disease: Secondary | ICD-10-CM | POA: Diagnosis not present

## 2019-12-06 DIAGNOSIS — M549 Dorsalgia, unspecified: Secondary | ICD-10-CM | POA: Diagnosis not present

## 2019-12-06 DIAGNOSIS — E1122 Type 2 diabetes mellitus with diabetic chronic kidney disease: Secondary | ICD-10-CM | POA: Diagnosis not present

## 2019-12-06 DIAGNOSIS — R7881 Bacteremia: Secondary | ICD-10-CM | POA: Diagnosis not present

## 2019-12-06 DIAGNOSIS — I5022 Chronic systolic (congestive) heart failure: Secondary | ICD-10-CM | POA: Diagnosis not present

## 2019-12-06 DIAGNOSIS — Z741 Need for assistance with personal care: Secondary | ICD-10-CM | POA: Diagnosis not present

## 2019-12-06 DIAGNOSIS — N179 Acute kidney failure, unspecified: Secondary | ICD-10-CM | POA: Diagnosis not present

## 2019-12-06 DIAGNOSIS — R278 Other lack of coordination: Secondary | ICD-10-CM | POA: Diagnosis not present

## 2019-12-07 ENCOUNTER — Other Ambulatory Visit: Payer: Self-pay

## 2019-12-07 ENCOUNTER — Encounter: Payer: Self-pay | Admitting: Family Medicine

## 2019-12-07 ENCOUNTER — Ambulatory Visit: Payer: Medicare PPO | Admitting: Family Medicine

## 2019-12-07 VITALS — BP 142/68 | HR 62 | Ht 71.0 in | Wt 178.0 lb

## 2019-12-07 DIAGNOSIS — E440 Moderate protein-calorie malnutrition: Secondary | ICD-10-CM | POA: Diagnosis not present

## 2019-12-07 DIAGNOSIS — E119 Type 2 diabetes mellitus without complications: Secondary | ICD-10-CM

## 2019-12-07 DIAGNOSIS — E1122 Type 2 diabetes mellitus with diabetic chronic kidney disease: Secondary | ICD-10-CM | POA: Diagnosis not present

## 2019-12-07 DIAGNOSIS — Z741 Need for assistance with personal care: Secondary | ICD-10-CM | POA: Diagnosis not present

## 2019-12-07 DIAGNOSIS — R7881 Bacteremia: Secondary | ICD-10-CM | POA: Diagnosis not present

## 2019-12-07 DIAGNOSIS — I5020 Unspecified systolic (congestive) heart failure: Secondary | ICD-10-CM | POA: Diagnosis not present

## 2019-12-07 DIAGNOSIS — I130 Hypertensive heart and chronic kidney disease with heart failure and stage 1 through stage 4 chronic kidney disease, or unspecified chronic kidney disease: Secondary | ICD-10-CM | POA: Diagnosis not present

## 2019-12-07 DIAGNOSIS — G8929 Other chronic pain: Secondary | ICD-10-CM | POA: Diagnosis not present

## 2019-12-07 DIAGNOSIS — M549 Dorsalgia, unspecified: Secondary | ICD-10-CM | POA: Diagnosis not present

## 2019-12-07 DIAGNOSIS — N179 Acute kidney failure, unspecified: Secondary | ICD-10-CM | POA: Diagnosis not present

## 2019-12-07 DIAGNOSIS — R278 Other lack of coordination: Secondary | ICD-10-CM | POA: Diagnosis not present

## 2019-12-07 DIAGNOSIS — I5022 Chronic systolic (congestive) heart failure: Secondary | ICD-10-CM | POA: Diagnosis not present

## 2019-12-07 NOTE — Progress Notes (Signed)
Chief Complaint  Patient presents with  . Hyperglycemia    History of Present Illness: HPI   86 year old male with recent hospitalization for acute HFrEF, syncope, Group B strep bacteremia dysphagia, found to have vocal cord paralysis from CVA place on feeding tube in  May and in June.  Feeding tube removed  Cardiac Cath on 11/28/2018  He has had previously well controlled DM with diet... now on low dose metformin BID.  Last GFR > 60  He no longer has feeding tube. He has lost weight.  He is using ensure in addition to regular meal.  Still having swallowing issues but clear for regular foods from [place].  Occ coughing with foods, mainly liquids.  he has been eating a daily bananna   Dr. Silvio Pate at [facility] has been caring for him. He has been back at home in last week since 11/25/2019. He is now on metformin BID. He presents today with hyperglycemia. Fasting CBGs 163-229  Lab Results  Component Value Date   HGBA1C 6.8 (H) 10/07/2019     This visit occurred during the SARS-CoV-2 public health emergency.  Safety protocols were in place, including screening questions prior to the visit, additional usage of staff PPE, and extensive cleaning of exam room while observing appropriate contact time as indicated for disinfecting solutions.   COVID 19 screen:  No recent travel or known exposure to COVID19 The patient denies respiratory symptoms of COVID 19 at this time. The importance of social distancing was discussed today.     Review of Systems  Constitutional: Negative for chills and fever.  HENT: Negative for congestion and ear pain.   Eyes: Negative for pain and redness.  Respiratory: Negative for cough and shortness of breath.   Cardiovascular: Negative for chest pain, palpitations and leg swelling.  Gastrointestinal: Negative for abdominal pain, blood in stool, constipation, diarrhea, nausea and vomiting.  Genitourinary: Negative for dysuria.  Musculoskeletal: Negative for  falls and myalgias.  Skin: Negative for rash.  Neurological: Negative for dizziness.  Psychiatric/Behavioral: Negative for depression. The patient is not nervous/anxious.       Past Medical History:  Diagnosis Date  . Abnormal glucose   . Arthritis   . Baker's cyst of knee    left  . BPH (benign prostatic hypertrophy)   . Cough    because of "tight" esophagus  . Diabetes mellitus   . Glaucoma   . HOH (hard of hearing)   . Hypertension   . Pheochromocytoma of right adrenal gland   . Ulcer   . Wears hearing aid    bilateral    reports that he has never smoked. He has never used smokeless tobacco. He reports current alcohol use of about 7.0 standard drinks of alcohol per week. He reports that he does not use drugs.   Current Outpatient Medications:  .  aspirin EC 81 MG tablet, Take 1 tablet (81 mg total) by mouth daily. Swallow whole., Disp: , Rfl:  .  atorvastatin (LIPITOR) 20 MG tablet, TAKE ONE TABLET EVERY DAY (Patient taking differently: Take 20 mg by mouth daily. ), Disp: 90 tablet, Rfl: 1 .  carvedilol (COREG) 6.25 MG tablet, TAKE 1 TABLET BY MOUTH TWICE DAILY WITH A MEAL, Disp: 180 tablet, Rfl: 1 .  finasteride (PROSCAR) 5 MG tablet, TAKE ONE TABLET EVERY DAY (Patient taking differently: Take 5 mg by mouth daily. ), Disp: 90 tablet, Rfl: 1 .  latanoprost (XALATAN) 0.005 % ophthalmic solution, Place 1 drop into  both eyes at bedtime., Disp: , Rfl:  .  losartan (COZAAR) 100 MG tablet, TAKE ONE TABLET EVERY DAY (Patient taking differently: Take 100 mg by mouth daily. ), Disp: 90 tablet, Rfl: 1 .  metFORMIN (GLUCOPHAGE) 500 MG tablet, Take by mouth daily., Disp: , Rfl:  .  pantoprazole (PROTONIX) 20 MG tablet, Take 1 tablet (20 mg total) by mouth daily., Disp: 30 tablet, Rfl: 6 .  polyethylene glycol powder (MIRALAX) 17 GM/SCOOP powder, Take 1 Container by mouth daily as needed for mild constipation or moderate constipation. , Disp: , Rfl:  .  tamsulosin (FLOMAX) 0.4 MG CAPS  capsule, TAKE 2 CAPSULES EVERY DAY (Patient taking differently: Take 0.8 mg by mouth daily. ), Disp: 180 capsule, Rfl: 3 .  timolol (TIMOPTIC) 0.5 % ophthalmic solution, Place 1 drop into both eyes 2 (two) times daily. , Disp: , Rfl:    Observations/Objective: Blood pressure (!) 142/68, pulse 62, height 5\' 11"  (1.803 m), weight 178 lb (80.7 kg), SpO2 98 %.  Physical Exam Constitutional:      Appearance: He is well-developed.     Comments: Elderly male in NAD  HENT:     Head: Normocephalic.     Right Ear: Hearing normal.     Left Ear: Hearing normal.     Nose: Nose normal.  Neck:     Thyroid: No thyroid mass or thyromegaly.     Vascular: No carotid bruit.     Trachea: Trachea normal.  Cardiovascular:     Rate and Rhythm: Normal rate and regular rhythm.     Pulses: Normal pulses.     Heart sounds: Heart sounds not distant. Murmur heard.  Systolic murmur is present with a grade of 2/6.  No friction rub. No gallop.      Comments: No peripheral edema Pulmonary:     Effort: Pulmonary effort is normal. No respiratory distress.     Breath sounds: Normal breath sounds.  Musculoskeletal:     Right lower leg: No edema.     Left lower leg: No edema.  Skin:    General: Skin is warm and dry.     Findings: No rash.  Psychiatric:        Speech: Speech normal.        Behavior: Behavior normal.        Thought Content: Thought content normal.      Assessment and Plan   Malnutrition of moderate degree  Keep up with protein in diet but try to lower carbohydrate to 30 grams  For breakfast and lunch and 45 grams at dinner. 15 grams  for snacks. Avoid  more weight loss and while controlling diabetes.  Diabetes mellitus without complication (HCC) Avoid higher sugar fruits like bannana and orange. Avoid juices.  Keep up with protein in diet but try to lower carbohydrate to 30 grams  For breakfast and lunch and 45 grams at dinner. 15 grams  for snacks.  Continue metformin 500 mg twice  daily.   HFrEF (heart failure with reduced ejection fraction) ([place]) Followed by cardiology.     Eliezer Lofts, MD

## 2019-12-07 NOTE — Patient Instructions (Addendum)
Avoid higher sugar fruits like bannana and orange. Avoid juices.  Keep up with protein in diet but try to lower carbohydrate to 30 grams  For breakfast and lunch and 45 grams at dinner. 15 grams  for snacks.  Continue metformin 500 mg twice daily.  

## 2019-12-10 DIAGNOSIS — I5022 Chronic systolic (congestive) heart failure: Secondary | ICD-10-CM | POA: Diagnosis not present

## 2019-12-10 DIAGNOSIS — G8929 Other chronic pain: Secondary | ICD-10-CM | POA: Diagnosis not present

## 2019-12-10 DIAGNOSIS — I130 Hypertensive heart and chronic kidney disease with heart failure and stage 1 through stage 4 chronic kidney disease, or unspecified chronic kidney disease: Secondary | ICD-10-CM | POA: Diagnosis not present

## 2019-12-10 DIAGNOSIS — E1122 Type 2 diabetes mellitus with diabetic chronic kidney disease: Secondary | ICD-10-CM | POA: Diagnosis not present

## 2019-12-10 DIAGNOSIS — N179 Acute kidney failure, unspecified: Secondary | ICD-10-CM | POA: Diagnosis not present

## 2019-12-10 DIAGNOSIS — Z741 Need for assistance with personal care: Secondary | ICD-10-CM | POA: Diagnosis not present

## 2019-12-10 DIAGNOSIS — R7881 Bacteremia: Secondary | ICD-10-CM | POA: Diagnosis not present

## 2019-12-10 DIAGNOSIS — R278 Other lack of coordination: Secondary | ICD-10-CM | POA: Diagnosis not present

## 2019-12-10 DIAGNOSIS — M549 Dorsalgia, unspecified: Secondary | ICD-10-CM | POA: Diagnosis not present

## 2019-12-11 ENCOUNTER — Other Ambulatory Visit: Payer: Self-pay

## 2019-12-11 ENCOUNTER — Encounter: Payer: Self-pay | Admitting: Family

## 2019-12-11 ENCOUNTER — Ambulatory Visit: Payer: Medicare PPO | Admitting: Family

## 2019-12-11 VITALS — BP 138/70 | HR 53 | Ht 71.0 in | Wt 180.4 lb

## 2019-12-11 DIAGNOSIS — I350 Nonrheumatic aortic (valve) stenosis: Secondary | ICD-10-CM

## 2019-12-11 DIAGNOSIS — R278 Other lack of coordination: Secondary | ICD-10-CM | POA: Diagnosis not present

## 2019-12-11 DIAGNOSIS — I5020 Unspecified systolic (congestive) heart failure: Secondary | ICD-10-CM | POA: Diagnosis not present

## 2019-12-11 DIAGNOSIS — R55 Syncope and collapse: Secondary | ICD-10-CM

## 2019-12-11 DIAGNOSIS — I10 Essential (primary) hypertension: Secondary | ICD-10-CM

## 2019-12-11 DIAGNOSIS — R7881 Bacteremia: Secondary | ICD-10-CM | POA: Diagnosis not present

## 2019-12-11 DIAGNOSIS — N179 Acute kidney failure, unspecified: Secondary | ICD-10-CM | POA: Diagnosis not present

## 2019-12-11 DIAGNOSIS — I130 Hypertensive heart and chronic kidney disease with heart failure and stage 1 through stage 4 chronic kidney disease, or unspecified chronic kidney disease: Secondary | ICD-10-CM | POA: Diagnosis not present

## 2019-12-11 DIAGNOSIS — E1122 Type 2 diabetes mellitus with diabetic chronic kidney disease: Secondary | ICD-10-CM | POA: Diagnosis not present

## 2019-12-11 DIAGNOSIS — I5022 Chronic systolic (congestive) heart failure: Secondary | ICD-10-CM | POA: Diagnosis not present

## 2019-12-11 DIAGNOSIS — R001 Bradycardia, unspecified: Secondary | ICD-10-CM | POA: Diagnosis not present

## 2019-12-11 DIAGNOSIS — G8929 Other chronic pain: Secondary | ICD-10-CM | POA: Diagnosis not present

## 2019-12-11 DIAGNOSIS — E785 Hyperlipidemia, unspecified: Secondary | ICD-10-CM | POA: Diagnosis not present

## 2019-12-11 DIAGNOSIS — M549 Dorsalgia, unspecified: Secondary | ICD-10-CM | POA: Diagnosis not present

## 2019-12-11 DIAGNOSIS — Z741 Need for assistance with personal care: Secondary | ICD-10-CM | POA: Diagnosis not present

## 2019-12-11 MED ORDER — SPIRONOLACTONE 25 MG PO TABS
12.5000 mg | ORAL_TABLET | Freq: Every day | ORAL | 1 refills | Status: DC
Start: 2019-12-11 — End: 2020-02-19

## 2019-12-11 NOTE — Patient Instructions (Addendum)
Medication Instructions:  Your physician has recommended you make the following change in your medication:   START Spironolactone 12.5mg  (half tablet) daily  *This medication is to help strengthen your heart and lower your blood pressure*  *If you need a refill on your cardiac medications before your next appointment, please call your pharmacy*  Lab Work: Your physician recommends that you return for lab work in: 2 weeks for BMP  Please stop by the [place] in 2 weeks for this blood work. You do not need to be fasting for this blood work.   If you have labs (blood work) drawn today and your tests are completely normal, you will receive your results only by: Marland Kitchen MyChart Message (if you have MyChart) OR . A paper copy in the mail If you have any lab test that is abnormal or we need to change your treatment, we will call you to review the results.   Testing/Procedures: Your EKG today shows sinus bradycardia which is a stable finding.   Your physician has requested that you have an echocardiogram in August. Echocardiography is a painless test that uses sound waves to create images of your heart. It provides your doctor with information about the size and shape of your heart and how well your heart's chambers and valves are working. This procedure takes approximately one hour. There are no restrictions for this procedure.This will let us look at your aortic valve that is moderately stiff and your heart pumping function which was low on your echocardiogram in May.    Follow-Up: At [facility], you and your health needs are our priority.  As part of our continuing mission to provide you with exceptional heart care, we have created designated Provider Care Teams.  These Care Teams include your primary Cardiologist (physician) and Advanced Practice Providers (APPs -  Physician Assistants and Nurse Practitioners) who all work together to provide you with the care you need, when you need  it.  We recommend signing up for the patient portal called "MyChart".  Sign up information is provided on this After Visit Summary.  MyChart is used to connect with patients for Virtual Visits (Telemedicine).  Patients are able to view lab/test results, encounter notes, upcoming appointments, etc.  Non-urgent messages can be sent to your provider as well.   To learn more about what you can do with MyChart, go to [email or web address].    Your next appointment:   After echocardiogram with Dr. Rockey Situ or APP  Other Instructions  Recommend making position changes slowly to prevent lightheadedness.  Continue your low salt diet.   Call us if you notice new chest pain, worsening shortness of breath, or episodes of almost passing out or passing out.

## 2019-12-11 NOTE — Progress Notes (Signed)
Office Visit    Patient Name: Dustin Harrison Date of Encounter: 12/11/2019  Primary Care Provider:  Jinny Sanders, MD Primary Cardiologist:  Ida Rogue, MD Electrophysiologist:  None   Chief Complaint    Mahamed Zalewski is a 86 y.o. male with a hx of HFrEF, bradycardia, h/o pheochromocytoma, R adrenal tumor s/p R adrenalectomy 2--6, cochlear implant on left 06/2018, Dm2, chronic back pain, recent bacteremia 10/2019 s/p TEE and IV abx, CKD, CAD, aortic stenosis, syncope presents today for follow up after cardiac catheterization.   Past Medical History    Past Medical History:  Diagnosis Date  . Abnormal glucose   . Arthritis   . Baker's cyst of knee    left  . BPH (benign prostatic hypertrophy)   . Cough    because of "tight" esophagus  . Diabetes mellitus   . Glaucoma   . HOH (hard of hearing)   . Hypertension   . Pheochromocytoma of right adrenal gland   . Ulcer   . Wears hearing aid    bilateral   Past Surgical History:  Procedure Laterality Date  . adrenaletomy    . CATARACT EXTRACTION W/PHACO Left 10/08/2015   Procedure: CATARACT EXTRACTION PHACO AND INTRAOCULAR LENS PLACEMENT ([place]) left eye;  Surgeon: Leandrew Koyanagi, MD;  Location: [place];  Service: Ophthalmology;  Laterality: Left;  MALYUGIN  . HERNIA REPAIR    . RIGHT/LEFT HEART CATH AND CORONARY ANGIOGRAPHY Left 11/28/2019   Procedure: RIGHT/LEFT HEART CATH AND CORONARY ANGIOGRAPHY;  Surgeon: Nelva Bush, MD;  Location: [place] [facility];  Service: Cardiovascular;  Laterality: Left;  . SQUAMOUS CELL CARCINOMA EXCISION  03-2006   left ear  . TEE WITHOUT CARDIOVERSION N/A 10/12/2019   Procedure: TRANSESOPHAGEAL ECHOCARDIOGRAM (TEE);  Surgeon: Minna Merritts, MD;  Location: [facility];  Service: Cardiovascular;  Laterality: N/A;  . TONSILLECTOMY    . XI ROBOTIC LAPAROSCOPIC ASSISTED APPENDECTOMY N/A 10/11/2019   Procedure: XI ROBOTIC ASSISTED LAPAROSCOPIC INSERTION GASTROSTOMY TUBE;   Surgeon: Jules Husbands, MD;  Location: [facility];  Service: General;  Laterality: N/A;    Allergies  No Known Allergies  History of Present Illness    Dustin Harrison is a 86 y.o. male with a hx of HFrEF, bradycardia, h/o pheochromocytoma, R adrenal tumor s/p R adrenalectomy 2--6, cochlear implant on left 06/2018, Dm2, chronic back pain, recent bacteremia 10/2019 s/p TEE and IV abx, CKD, CAD, aortic stenosis, syncope. He was last seen for cardiac catheterization 11/28/19.  Evaluated while admitted 10/09/19 in setting of positive cultures for GBS bacteremia. Source of infection not identified. TEE with LVEF 25-35%, gr1DD, significant wall motion abnormalities with images suggestive of stress induced CM versus LAD infarct. TEE with no evidence of vegetation/endocarditis and moderate AS not visualized on TTE. He was started on feeding tube due to R vocal cord paralysis. Noted 2.2cm intraluminal density which would require further GI workup.   Seen in clinic 11/26/19 noting recent syncopal episode with LOC for 10 seconds.   Cardiac cath 11/28/19 with severe single-vessel coronary disease with 90% ostial stenosis involving small ramus intermedius that fills via right-to-left collaterals, mild to moderate nonobstructive CAD involving LAD, LCx, RCA, upper normal to mildly elevated L/R heart filling pressures, mild to moderate AS.   He was previously at skilled section at [facility], but has been home in the independent living portion with his wife for 1 week. Seen by primary care last week for follow up of diabetes and  recommended to continue Metformin 500mg  twice daily.   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   Tells me his heart is not as strong as he would like to be. Reports his DOE is stable. No orthopnea, PND, LE edema. No chest pain, pressure, tightness. Reports no issues with his R radial cath site. Tells me he has an occasional lightheadedness particularly notable with position  changes but no near-syncope nor syncope.    BP at home 160/80. HR normal 50s. Reports he has had a HR in the 50s for many years and this has never bothered him.  We reviewed cardiac cath results as well as echocardiogram. Reviewed signs and symptoms of worsening aortic stenosis including syncope, chest pain, shortness of breath. We reviewed heart failure lifestyle recommendations.   He endorses he is still having some difficulty with his swallowing. His feeding tube has been removed and he is working with speech therapy.   EKGs/Labs/Other Studies Reviewed:   The following studies were reviewed today:  [facility] 11/28/19 Conclusions: 1. Severe single-vessel coronary artery disease with 90% ostial stenosis involving small ramus intermedius branch that also fills via right-to-left collaterals. 2. Mild to moderate, non-obstructive coronary artery disease involving the LAD, LCx, and RCA. 3. Upper normal to mildly elevated left and right heart filling pressures. 4. Hyperdynamic left ventricular contraction. 5. Mild to moderate aortic stenosis.   Recommendations: 1. Continue medical therapy and secondary prevention of coronary artery disease. 2. Consider repeat echo to confirm LVEF and aortic stenosis.  TTE 10/08/19  1. Left ventricular ejection fraction, by estimation, is 25 to 30%. The  left ventricle has severely decreased function. The left ventricle has no  regional wall motion abnormalities. There is moderate left ventricular  hypertrophy. Left ventricular  diastolic parameters are consistent with Grade I diastolic dysfunction  (impaired relaxation). There is severe akinesis of the left ventricular,  mid-apical anteroseptal wall, anterolateral wall, anterior segment, apical  segment and inferoseptal wall. Wall   motion abnormalities are suggestive of stress induced cardiomyopathy vs.  LAD infarct.   2. Right ventricular systolic function is normal. The right ventricular  size is normal.  There is mildly elevated pulmonary artery systolic  pressure.   3. Left atrial size was mildly dilated.   4. The mitral valve is normal in structure. No evidence of mitral valve  regurgitation. No evidence of mitral stenosis.   5. The aortic valve is normal in structure. Aortic valve regurgitation is  not visualized. Mild to moderate aortic valve sclerosis/calcification is  present, without any evidence of aortic stenosis.   6. The inferior vena cava is normal in size with greater than 50%  respiratory variability, suggesting right atrial pressure of 3 mmHg.   7. No clear vegetations but suboptimal study.    TEE 10/12/19  1. Left ventricular ejection fraction, by estimation, is 25 to 30%. The  left ventricle has severely decreased function. The left ventricle  demonstrates global hypokinesis.   2. Right ventricular systolic function is normal. The right ventricular  size is normal. Tricuspid regurgitation signal is inadequate for assessing  PA pressure.   3. The mitral valve is normal in structure. No evidence of mitral valve  regurgitation.   4. Aortic valve regurgitation is not visualized. Severe calcification of  aortic valve. Visually, there appears to be moderate aortic valve  stenosis.   5. Grossly, no valve vegetation noted.    EKG:  EKG is  ordered today.  The ekg ordered today  demonstrates SB 53 bpm with nonspecific T wave changes - improvement in inferior T wave changes compared to previous EKG.  Recent Labs: 10/11/2019: ALT 40 10/14/2019: B Natriuretic Peptide 1,273.0 10/15/2019: Magnesium 2.4 11/26/2019: Hemoglobin 12.0; Platelets 497 11/28/2019: BUN 44; Creatinine, Ser 1.10; Potassium 4.3; Sodium 138  Recent Lipid Panel    Component Value Date/Time   CHOL 173 11/09/2018 0849   TRIG 47.0 11/09/2018 0849   HDL 86.10 11/09/2018 0849   CHOLHDL 2 11/09/2018 0849   VLDL 9.4 11/09/2018 0849   LDLCALC 77 11/09/2018 0849   LDLDIRECT 126.2 04/05/2013 0739    Home Medications     Current Meds  Medication Sig  . aspirin EC 81 MG tablet Take 1 tablet (81 mg total) by mouth daily. Swallow whole.  Marland Kitchen atorvastatin (LIPITOR) 20 MG tablet TAKE ONE TABLET EVERY DAY (Patient taking differently: Take 20 mg by mouth daily. )  . carvedilol (COREG) 6.25 MG tablet TAKE 1 TABLET BY MOUTH TWICE DAILY WITH A MEAL  . finasteride (PROSCAR) 5 MG tablet TAKE ONE TABLET EVERY DAY (Patient taking differently: Take 5 mg by mouth daily. )  . latanoprost (XALATAN) 0.005 % ophthalmic solution Place 1 drop into both eyes at bedtime.  Marland Kitchen losartan (COZAAR) 100 MG tablet TAKE ONE TABLET EVERY DAY (Patient taking differently: Take 100 mg by mouth daily. )  . metFORMIN (GLUCOPHAGE) 500 MG tablet Take by mouth 2 (two) times daily with a meal.  . pantoprazole (PROTONIX) 20 MG tablet Take 1 tablet (20 mg total) by mouth daily.  . polyethylene glycol powder (MIRALAX) 17 GM/SCOOP powder Take 1 Container by mouth daily as needed for mild constipation or moderate constipation.   . tamsulosin (FLOMAX) 0.4 MG CAPS capsule TAKE 2 CAPSULES EVERY DAY (Patient taking differently: Take 0.8 mg by mouth daily. )  . timolol (TIMOPTIC) 0.5 % ophthalmic solution Place 1 drop into both eyes 2 (two) times daily.     Review of Systems    Review of Systems  Constitutional: Negative for chills, fever and malaise/fatigue.  Cardiovascular: Positive for dyspnea on exertion. Negative for chest pain, leg swelling, near-syncope, orthopnea, palpitations and syncope.  Respiratory: Negative for cough, shortness of breath and wheezing.   Gastrointestinal: Negative for nausea and vomiting.  Neurological: Negative for dizziness, light-headedness and weakness.   All other systems reviewed and are otherwise negative except as noted above.  Physical Exam    VS:  BP 138/70 (BP Location: Left Arm, Patient Position: Sitting, Cuff Size: Normal)   Pulse (!) 53   Ht 5\' 11"  (1.803 m)   Wt 180 lb 6 oz (81.8 kg)   SpO2 96%   BMI 25.16  kg/m  , BMI Body mass index is 25.16 kg/m. GEN: Well nourished, well developed, in no acute distress. HEENT: normal. Neck: Supple, no JVD, carotid bruits, or masses. Cardiac: RRR, gr 2/6 systolic murmur, no  rubs, or gallops. No clubbing, cyanosis, edema.  Radials/DP/PT 2+ and equal bilaterally.  Respiratory:  Respirations regular and unlabored, clear to auscultation bilaterally. GI: Soft, nontender, nondistended, BS + x 4. MS: No deformity or atrophy. Skin: Warm and dry, no rash. Neuro:  Strength and sensation are intact. Psych: Normal affect.  Assessment & Plan    1. Lightheadedness/orthostatic hypotension/syncope -rReports no recurrent syncope nor near syncope.  Reports intermittent lightheadedness that is exacerbated by position changes.  Reviewed orthostatic precautions. Bradycardic on exam today, but tells me this is a longstanding history and asymptomatic. Consider cardiac monitor in future if  syncope recurs.   2. CAD -reports no anginal symptoms.  Right and left heart cath 11/28/2019 with severe single-vessel CAD with 90% ostial stenosis involving small ramus intermedius branch that fills with right to left collaterals otherwise mild to moderate nonobstructive CAD in LAD, LCx, RCA. R radial cath site healing appropriately. Recommended for medical management. Continue aspirin, statin, beta blocker.   3. Aortic stenosis -moderate by TEE 10/2019.  Mild to moderate by echo 11/28/2019.  Educated on signs and symptoms of worsening aortic stenosis.  Plan for repeat echocardiogram in 1 month for reevaluation of LVEF and aortic stenosis.  4. HFrEF - Euvolemic and well compensated on exam. GDMT includes Coreg, Losartan. No indication for loop diuretic at this time. Start Spironolactone 12.5mg  daily and BMP in 2 weeks.   5. HTN -BP routinely 150-160/80s at home.  Add spironolactone, as above.  Plan to readdress blood pressure at follow-up with consideration of additional antihypertensive versus  transition to [facility]  6. Bradycardia -longstanding history of bradycardia dating back a number of years.  He is on Coreg 6.25 mg twice daily.  As his bradycardia is asymptomatic with no near syncope, syncope, weakness, fatigue we will plan to continue Coreg at present dosing.  Would have low threshold to reduce dose.  7. HLD, LDL goal <70 -11/2018 LDL 77.  Continue atorvastatin 20 mg daily.  Plan for lipid panel with next lab collection.  Disposition: BMP in 2 weeks. Echocardiogram in August. Follow up after echocardiogram with Dr. Rockey Situ or APP.   Loel Dubonnet, NP 12/11/2019, 6:47 PM

## 2019-12-12 ENCOUNTER — Telehealth: Payer: Self-pay | Admitting: Family Medicine

## 2019-12-12 DIAGNOSIS — I5022 Chronic systolic (congestive) heart failure: Secondary | ICD-10-CM | POA: Diagnosis not present

## 2019-12-12 DIAGNOSIS — G8929 Other chronic pain: Secondary | ICD-10-CM | POA: Diagnosis not present

## 2019-12-12 DIAGNOSIS — N179 Acute kidney failure, unspecified: Secondary | ICD-10-CM | POA: Diagnosis not present

## 2019-12-12 DIAGNOSIS — Z741 Need for assistance with personal care: Secondary | ICD-10-CM | POA: Diagnosis not present

## 2019-12-12 DIAGNOSIS — I130 Hypertensive heart and chronic kidney disease with heart failure and stage 1 through stage 4 chronic kidney disease, or unspecified chronic kidney disease: Secondary | ICD-10-CM | POA: Diagnosis not present

## 2019-12-12 DIAGNOSIS — E1122 Type 2 diabetes mellitus with diabetic chronic kidney disease: Secondary | ICD-10-CM | POA: Diagnosis not present

## 2019-12-12 DIAGNOSIS — R278 Other lack of coordination: Secondary | ICD-10-CM | POA: Diagnosis not present

## 2019-12-12 DIAGNOSIS — R7881 Bacteremia: Secondary | ICD-10-CM | POA: Diagnosis not present

## 2019-12-12 DIAGNOSIS — M549 Dorsalgia, unspecified: Secondary | ICD-10-CM | POA: Diagnosis not present

## 2019-12-12 NOTE — Telephone Encounter (Signed)
Please send to this fax number [phone]. Pt called back.

## 2019-12-12 NOTE — Telephone Encounter (Signed)
Pt would like a referral to speech therapy. He said it needs to go to Environmental manager at [facility].

## 2019-12-13 DIAGNOSIS — E1122 Type 2 diabetes mellitus with diabetic chronic kidney disease: Secondary | ICD-10-CM | POA: Diagnosis not present

## 2019-12-13 DIAGNOSIS — R278 Other lack of coordination: Secondary | ICD-10-CM | POA: Diagnosis not present

## 2019-12-13 DIAGNOSIS — G8929 Other chronic pain: Secondary | ICD-10-CM | POA: Diagnosis not present

## 2019-12-13 DIAGNOSIS — I130 Hypertensive heart and chronic kidney disease with heart failure and stage 1 through stage 4 chronic kidney disease, or unspecified chronic kidney disease: Secondary | ICD-10-CM | POA: Diagnosis not present

## 2019-12-13 DIAGNOSIS — I5022 Chronic systolic (congestive) heart failure: Secondary | ICD-10-CM | POA: Diagnosis not present

## 2019-12-13 DIAGNOSIS — Z741 Need for assistance with personal care: Secondary | ICD-10-CM | POA: Diagnosis not present

## 2019-12-13 DIAGNOSIS — R7881 Bacteremia: Secondary | ICD-10-CM | POA: Diagnosis not present

## 2019-12-13 DIAGNOSIS — N179 Acute kidney failure, unspecified: Secondary | ICD-10-CM | POA: Diagnosis not present

## 2019-12-13 DIAGNOSIS — M549 Dorsalgia, unspecified: Secondary | ICD-10-CM | POA: Diagnosis not present

## 2019-12-13 NOTE — Telephone Encounter (Signed)
In Donna's box in my office. Please fax

## 2019-12-14 DIAGNOSIS — I5022 Chronic systolic (congestive) heart failure: Secondary | ICD-10-CM | POA: Diagnosis not present

## 2019-12-14 DIAGNOSIS — N179 Acute kidney failure, unspecified: Secondary | ICD-10-CM | POA: Diagnosis not present

## 2019-12-14 DIAGNOSIS — E1122 Type 2 diabetes mellitus with diabetic chronic kidney disease: Secondary | ICD-10-CM | POA: Diagnosis not present

## 2019-12-14 DIAGNOSIS — R278 Other lack of coordination: Secondary | ICD-10-CM | POA: Diagnosis not present

## 2019-12-14 DIAGNOSIS — R7881 Bacteremia: Secondary | ICD-10-CM | POA: Diagnosis not present

## 2019-12-14 DIAGNOSIS — G8929 Other chronic pain: Secondary | ICD-10-CM | POA: Diagnosis not present

## 2019-12-14 DIAGNOSIS — M549 Dorsalgia, unspecified: Secondary | ICD-10-CM | POA: Diagnosis not present

## 2019-12-14 DIAGNOSIS — Z741 Need for assistance with personal care: Secondary | ICD-10-CM | POA: Diagnosis not present

## 2019-12-14 DIAGNOSIS — I130 Hypertensive heart and chronic kidney disease with heart failure and stage 1 through stage 4 chronic kidney disease, or unspecified chronic kidney disease: Secondary | ICD-10-CM | POA: Diagnosis not present

## 2019-12-14 NOTE — Telephone Encounter (Signed)
Referral for speech therapy faxed to number provided and placed in pile to give to [facility] on Monday.

## 2019-12-17 DIAGNOSIS — M549 Dorsalgia, unspecified: Secondary | ICD-10-CM | POA: Diagnosis not present

## 2019-12-17 DIAGNOSIS — Z741 Need for assistance with personal care: Secondary | ICD-10-CM | POA: Diagnosis not present

## 2019-12-17 DIAGNOSIS — E1122 Type 2 diabetes mellitus with diabetic chronic kidney disease: Secondary | ICD-10-CM | POA: Diagnosis not present

## 2019-12-17 DIAGNOSIS — R278 Other lack of coordination: Secondary | ICD-10-CM | POA: Diagnosis not present

## 2019-12-17 DIAGNOSIS — R7881 Bacteremia: Secondary | ICD-10-CM | POA: Diagnosis not present

## 2019-12-17 DIAGNOSIS — I130 Hypertensive heart and chronic kidney disease with heart failure and stage 1 through stage 4 chronic kidney disease, or unspecified chronic kidney disease: Secondary | ICD-10-CM | POA: Diagnosis not present

## 2019-12-17 DIAGNOSIS — N179 Acute kidney failure, unspecified: Secondary | ICD-10-CM | POA: Diagnosis not present

## 2019-12-17 DIAGNOSIS — I5022 Chronic systolic (congestive) heart failure: Secondary | ICD-10-CM | POA: Diagnosis not present

## 2019-12-17 DIAGNOSIS — G8929 Other chronic pain: Secondary | ICD-10-CM | POA: Diagnosis not present

## 2019-12-19 DIAGNOSIS — Z741 Need for assistance with personal care: Secondary | ICD-10-CM | POA: Diagnosis not present

## 2019-12-19 DIAGNOSIS — I5022 Chronic systolic (congestive) heart failure: Secondary | ICD-10-CM | POA: Diagnosis not present

## 2019-12-19 DIAGNOSIS — R7881 Bacteremia: Secondary | ICD-10-CM | POA: Diagnosis not present

## 2019-12-19 DIAGNOSIS — G8929 Other chronic pain: Secondary | ICD-10-CM | POA: Diagnosis not present

## 2019-12-19 DIAGNOSIS — M549 Dorsalgia, unspecified: Secondary | ICD-10-CM | POA: Diagnosis not present

## 2019-12-19 DIAGNOSIS — R278 Other lack of coordination: Secondary | ICD-10-CM | POA: Diagnosis not present

## 2019-12-19 DIAGNOSIS — N179 Acute kidney failure, unspecified: Secondary | ICD-10-CM | POA: Diagnosis not present

## 2019-12-19 DIAGNOSIS — E1122 Type 2 diabetes mellitus with diabetic chronic kidney disease: Secondary | ICD-10-CM | POA: Diagnosis not present

## 2019-12-19 DIAGNOSIS — I130 Hypertensive heart and chronic kidney disease with heart failure and stage 1 through stage 4 chronic kidney disease, or unspecified chronic kidney disease: Secondary | ICD-10-CM | POA: Diagnosis not present

## 2019-12-20 ENCOUNTER — Telehealth: Payer: Self-pay

## 2019-12-20 NOTE — Telephone Encounter (Signed)
-----   Message from Clarisse Gouge sent at 12/20/2019  4:06 PM EDT ----- Regarding: add on Hey there patient was struggling to verbalize reason for visit. Looks like it is early but he also said he is going to get labs before visit.    Patient aware someone will call to discuss visit.  Please review for need  Thanks Delsa Sale

## 2019-12-20 NOTE — Telephone Encounter (Signed)
Notified patient to discuss any issues for why he wanted to be seen earlier than his scheduled appointment time. The patient stated, " I am doing fine and my appointment is scheduled now. I asked the patient again if having any problems and he stated, "No."

## 2019-12-21 ENCOUNTER — Other Ambulatory Visit
Admission: RE | Admit: 2019-12-21 | Discharge: 2019-12-21 | Disposition: A | Discharge status: Home / Self Care | Payer: Medicare PPO | Attending: Family | Admitting: Family

## 2019-12-21 DIAGNOSIS — R278 Other lack of coordination: Secondary | ICD-10-CM | POA: Diagnosis not present

## 2019-12-21 DIAGNOSIS — I5022 Chronic systolic (congestive) heart failure: Secondary | ICD-10-CM | POA: Diagnosis not present

## 2019-12-21 DIAGNOSIS — Z741 Need for assistance with personal care: Secondary | ICD-10-CM | POA: Diagnosis not present

## 2019-12-21 DIAGNOSIS — I350 Nonrheumatic aortic (valve) stenosis: Secondary | ICD-10-CM | POA: Diagnosis not present

## 2019-12-21 DIAGNOSIS — I5020 Unspecified systolic (congestive) heart failure: Secondary | ICD-10-CM | POA: Insufficient documentation

## 2019-12-21 DIAGNOSIS — G8929 Other chronic pain: Secondary | ICD-10-CM | POA: Diagnosis not present

## 2019-12-21 DIAGNOSIS — R7881 Bacteremia: Secondary | ICD-10-CM | POA: Diagnosis not present

## 2019-12-21 DIAGNOSIS — E1122 Type 2 diabetes mellitus with diabetic chronic kidney disease: Secondary | ICD-10-CM | POA: Diagnosis not present

## 2019-12-21 DIAGNOSIS — M549 Dorsalgia, unspecified: Secondary | ICD-10-CM | POA: Diagnosis not present

## 2019-12-21 DIAGNOSIS — N179 Acute kidney failure, unspecified: Secondary | ICD-10-CM | POA: Diagnosis not present

## 2019-12-21 DIAGNOSIS — I130 Hypertensive heart and chronic kidney disease with heart failure and stage 1 through stage 4 chronic kidney disease, or unspecified chronic kidney disease: Secondary | ICD-10-CM | POA: Diagnosis not present

## 2019-12-21 LAB — BASIC METABOLIC PANEL
Anion gap: 10 (ref 5–15)
BUN: 43 mg/dL — ABNORMAL HIGH (ref 8–23)
CO2: 25 mmol/L (ref 22–32)
Calcium: 9.7 mg/dL (ref 8.9–10.3)
Chloride: 101 mmol/L (ref 98–111)
Creatinine, Ser: 1.24 mg/dL (ref 0.61–1.24)
GFR calc Af Amer: >60 mL/min (ref >60)
GFR calc non Af Amer: 53 mL/min — ABNORMAL LOW (ref >60)
Glucose, Bld: 132 mg/dL — ABNORMAL HIGH (ref 70–99)
Potassium: 4.7 mmol/L (ref 3.5–5.1)
Sodium: 136 mmol/L (ref 135–145)

## 2019-12-24 ENCOUNTER — Ambulatory Visit: Payer: Medicare PPO | Admitting: Family

## 2019-12-24 DIAGNOSIS — M549 Dorsalgia, unspecified: Secondary | ICD-10-CM | POA: Diagnosis not present

## 2019-12-24 DIAGNOSIS — G8929 Other chronic pain: Secondary | ICD-10-CM | POA: Diagnosis not present

## 2019-12-24 DIAGNOSIS — R278 Other lack of coordination: Secondary | ICD-10-CM | POA: Diagnosis not present

## 2019-12-24 DIAGNOSIS — I130 Hypertensive heart and chronic kidney disease with heart failure and stage 1 through stage 4 chronic kidney disease, or unspecified chronic kidney disease: Secondary | ICD-10-CM | POA: Diagnosis not present

## 2019-12-24 DIAGNOSIS — I5022 Chronic systolic (congestive) heart failure: Secondary | ICD-10-CM | POA: Diagnosis not present

## 2019-12-24 DIAGNOSIS — Z741 Need for assistance with personal care: Secondary | ICD-10-CM | POA: Diagnosis not present

## 2019-12-24 DIAGNOSIS — N179 Acute kidney failure, unspecified: Secondary | ICD-10-CM | POA: Diagnosis not present

## 2019-12-24 DIAGNOSIS — R7881 Bacteremia: Secondary | ICD-10-CM | POA: Diagnosis not present

## 2019-12-24 DIAGNOSIS — E1122 Type 2 diabetes mellitus with diabetic chronic kidney disease: Secondary | ICD-10-CM | POA: Diagnosis not present

## 2019-12-24 NOTE — Progress Notes (Deleted)
Office Visit    Patient Name: Dustin Harrison Date of Encounter: 12/24/2019  Primary Care Provider:  Jinny Sanders, MD Primary Cardiologist:  Ida Rogue, MD Electrophysiologist:  None   Chief Complaint    Dustin Harrison is a 86 y.o. male with a hx of HFrEF, bradycardia, h/o pheochromocytoma, R adrenal tumor s/p R adrenalectomy 2--6, cochlear implant on left 06/2018, Dm2, chronic back pain, recent bacteremia 10/2019 s/p TEE and IV abx, CKD, CAD, aortic stenosis, syncope presents today for follow up after cardiac catheterization.   Past Medical History    Past Medical History:  Diagnosis Date  . Abnormal glucose   . Arthritis   . Baker's cyst of knee    left  . BPH (benign prostatic hypertrophy)   . Cough    because of "tight" esophagus  . Diabetes mellitus   . Glaucoma   . HOH (hard of hearing)   . Hypertension   . Pheochromocytoma of right adrenal gland   . Ulcer   . Wears hearing aid    bilateral   Past Surgical History:  Procedure Laterality Date  . adrenaletomy    . CATARACT EXTRACTION W/PHACO Left 10/08/2015   Procedure: CATARACT EXTRACTION PHACO AND INTRAOCULAR LENS PLACEMENT ([place]) left eye;  Surgeon: Leandrew Koyanagi, MD;  Location: [place];  Service: Ophthalmology;  Laterality: Left;  MALYUGIN  . HERNIA REPAIR    . RIGHT/LEFT HEART CATH AND CORONARY ANGIOGRAPHY Left 11/28/2019   Procedure: RIGHT/LEFT HEART CATH AND CORONARY ANGIOGRAPHY;  Surgeon: Nelva Bush, MD;  Location: [place] [facility];  Service: Cardiovascular;  Laterality: Left;  . SQUAMOUS CELL CARCINOMA EXCISION  03-2006   left ear  . TEE WITHOUT CARDIOVERSION N/A 10/12/2019   Procedure: TRANSESOPHAGEAL ECHOCARDIOGRAM (TEE);  Surgeon: Minna Merritts, MD;  Location: [facility];  Service: Cardiovascular;  Laterality: N/A;  . TONSILLECTOMY    . XI ROBOTIC LAPAROSCOPIC ASSISTED APPENDECTOMY N/A 10/11/2019   Procedure: XI ROBOTIC ASSISTED LAPAROSCOPIC INSERTION GASTROSTOMY TUBE;   Surgeon: Jules Husbands, MD;  Location: [facility];  Service: General;  Laterality: N/A;    Allergies  No Known Allergies  History of Present Illness    Dustin Harrison is a 86 y.o. male with a hx of HFrEF, bradycardia, h/o pheochromocytoma, R adrenal tumor s/p R adrenalectomy 2--6, cochlear implant on left 06/2018, Dm2, chronic back pain, recent bacteremia 10/2019 s/p TEE and IV abx, CKD, CAD, aortic stenosis, syncope. He was last seen ***  Evaluated while admitted 10/09/19 in setting of positive cultures for GBS bacteremia. Source of infection not identified. TEE with LVEF 25-35%, gr1DD, significant wall motion abnormalities with images suggestive of stress induced CM versus LAD infarct. TEE with no evidence of vegetation/endocarditis and moderate AS not visualized on TTE. He was started on feeding tube due to R vocal cord paralysis. Noted 2.2cm intraluminal density which would require further GI workup.   Seen in clinic 11/26/19 noting recent syncopal episode with LOC for 10 seconds.   Cardiac cath 11/28/19 with severe single-vessel coronary disease with 90% ostial stenosis involving small ramus intermedius that fills via right-to-left collaterals, mild to moderate nonobstructive CAD involving LAD, LCx, RCA, upper normal to mildly elevated L/R heart filling pressures, mild to moderate AS.   Seen in clinic 12/11/19 after he had transitioned back to independent living at [facility]. 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 His feeding tube had been removed and he was working with  speech therapy. He was started on Spironolactone 12.5mg  twice daily.  ***  EKGs/Labs/Other Studies Reviewed:   The following studies were reviewed today:  [facility] 11/28/19 Conclusions: 1. Severe single-vessel coronary artery disease with 90% ostial stenosis involving small ramus intermedius branch that also fills via right-to-left collaterals. 2. Mild to moderate, non-obstructive coronary  artery disease involving the LAD, LCx, and RCA. 3. Upper normal to mildly elevated left and right heart filling pressures. 4. Hyperdynamic left ventricular contraction. 5. Mild to moderate aortic stenosis.   Recommendations: 1. Continue medical therapy and secondary prevention of coronary artery disease. 2. Consider repeat echo to confirm LVEF and aortic stenosis.  TTE 10/08/19  1. Left ventricular ejection fraction, by estimation, is 25 to 30%. The  left ventricle has severely decreased function. The left ventricle has no  regional wall motion abnormalities. There is moderate left ventricular  hypertrophy. Left ventricular  diastolic parameters are consistent with Grade I diastolic dysfunction  (impaired relaxation). There is severe akinesis of the left ventricular,  mid-apical anteroseptal wall, anterolateral wall, anterior segment, apical  segment and inferoseptal wall. Wall   motion abnormalities are suggestive of stress induced cardiomyopathy vs.  LAD infarct.   2. Right ventricular systolic function is normal. The right ventricular  size is normal. There is mildly elevated pulmonary artery systolic  pressure.   3. Left atrial size was mildly dilated.   4. The mitral valve is normal in structure. No evidence of mitral valve  regurgitation. No evidence of mitral stenosis.   5. The aortic valve is normal in structure. Aortic valve regurgitation is  not visualized. Mild to moderate aortic valve sclerosis/calcification is  present, without any evidence of aortic stenosis.   6. The inferior vena cava is normal in size with greater than 50%  respiratory variability, suggesting right atrial pressure of 3 mmHg.   7. No clear vegetations but suboptimal study.    TEE 10/12/19  1. Left ventricular ejection fraction, by estimation, is 25 to 30%. The  left ventricle has severely decreased function. The left ventricle  demonstrates global hypokinesis.   2. Right ventricular systolic  function is normal. The right ventricular  size is normal. Tricuspid regurgitation signal is inadequate for assessing  PA pressure.   3. The mitral valve is normal in structure. No evidence of mitral valve  regurgitation.   4. Aortic valve regurgitation is not visualized. Severe calcification of  aortic valve. Visually, there appears to be moderate aortic valve  stenosis.   5. Grossly, no valve vegetation noted.    EKG:  EKG is  ordered today.  The ekg ordered today demonstrates SB 53 bpm with nonspecific T wave changes - improvement in inferior T wave changes compared to previous EKG.  Recent Labs: 10/11/2019: ALT 40 10/14/2019: B Natriuretic Peptide 1,273.0 10/15/2019: Magnesium 2.4 11/26/2019: Hemoglobin 12.0; Platelets 497 12/21/2019: BUN 43; Creatinine, Ser 1.24; Potassium 4.7; Sodium 136  Recent Lipid Panel    Component Value Date/Time   CHOL 173 11/09/2018 0849   TRIG 47.0 11/09/2018 0849   HDL 86.10 11/09/2018 0849   CHOLHDL 2 11/09/2018 0849   VLDL 9.4 11/09/2018 0849   LDLCALC 77 11/09/2018 0849   LDLDIRECT 126.2 04/05/2013 0739    Home Medications   No outpatient medications have been marked as taking for the 12/24/19 encounter (Appointment) with Loel Dubonnet, NP.    Review of Systems   *** Review of Systems  Constitutional: Negative for chills, fever and malaise/fatigue.  Cardiovascular: Positive for dyspnea  on exertion. Negative for chest pain, leg swelling, near-syncope, orthopnea, palpitations and syncope.  Respiratory: Negative for cough, shortness of breath and wheezing.   Gastrointestinal: Negative for nausea and vomiting.  Neurological: Negative for dizziness, light-headedness and weakness.   All other systems reviewed and are otherwise negative except as noted above.  Physical Exam   *** VS:  There were no vitals taken for this visit. , BMI There is no height or weight on file to calculate BMI. GEN: Well nourished, well developed, in no acute  distress. HEENT: normal. Neck: Supple, no JVD, carotid bruits, or masses. Cardiac: RRR, gr 2/6 systolic murmur, no  rubs, or gallops. No clubbing, cyanosis, edema.  Radials/DP/PT 2+ and equal bilaterally.  Respiratory:  Respirations regular and unlabored, clear to auscultation bilaterally. GI: Soft, nontender, nondistended, BS + x 4. MS: No deformity or atrophy. Skin: Warm and dry, no rash. Neuro:  Strength and sensation are intact. Psych: Normal affect.  Assessment & Plan   *** 1. Lightheadedness/orthostatic hypotension/syncope -rReports no recurrent syncope nor near syncope.  Reports intermittent lightheadedness that is exacerbated by position changes.  Reviewed orthostatic precautions. Bradycardic on exam today, but tells me this is a longstanding history and asymptomatic. Consider cardiac monitor in future if syncope recurs.   2. CAD -reports no anginal symptoms.  Right and left heart cath 11/28/2019 with severe single-vessel CAD with 90% ostial stenosis involving small ramus intermedius branch that fills with right to left collaterals otherwise mild to moderate nonobstructive CAD in LAD, LCx, RCA. R radial cath site healing appropriately. Recommended for medical management. Continue aspirin, statin, beta blocker.   3. Aortic stenosis -moderate by TEE 10/2019.  Mild to moderate by echo 11/28/2019.  Educated on signs and symptoms of worsening aortic stenosis.  Plan for repeat echocardiogram in 1 month for reevaluation of LVEF and aortic stenosis.  4. HFrEF - Euvolemic and well compensated on exam. GDMT includes Coreg, Losartan. No indication for loop diuretic at this time. Start Spironolactone 12.5mg  daily and BMP in 2 weeks.   5. HTN -BP routinely 150-160/80s at home.  Add spironolactone, as above.  Plan to readdress blood pressure at follow-up with consideration of additional antihypertensive versus transition to [facility].  6. Bradycardia -longstanding history of bradycardia dating back a  number of years.  He is on Coreg 6.25 mg twice daily.  As his bradycardia is asymptomatic with no near syncope, syncope, weakness, fatigue we will plan to continue Coreg at present dosing.  Would have low threshold to reduce dose.  7. HLD, LDL goal <70 -11/2018 LDL 77.  Continue atorvastatin 20 mg daily.  Plan for lipid panel with next lab collection.  Disposition: ***BMP in 2 weeks. Echocardiogram in August. Follow up after echocardiogram with Dr. Rockey Situ or APP.   Loel Dubonnet, NP 12/24/2019, 12:21 PM

## 2019-12-25 ENCOUNTER — Encounter: Payer: Self-pay | Admitting: Family

## 2019-12-26 DIAGNOSIS — Z741 Need for assistance with personal care: Secondary | ICD-10-CM | POA: Diagnosis not present

## 2019-12-26 DIAGNOSIS — I130 Hypertensive heart and chronic kidney disease with heart failure and stage 1 through stage 4 chronic kidney disease, or unspecified chronic kidney disease: Secondary | ICD-10-CM | POA: Diagnosis not present

## 2019-12-26 DIAGNOSIS — M549 Dorsalgia, unspecified: Secondary | ICD-10-CM | POA: Diagnosis not present

## 2019-12-26 DIAGNOSIS — G8929 Other chronic pain: Secondary | ICD-10-CM | POA: Diagnosis not present

## 2019-12-26 DIAGNOSIS — N179 Acute kidney failure, unspecified: Secondary | ICD-10-CM | POA: Diagnosis not present

## 2019-12-26 DIAGNOSIS — E1122 Type 2 diabetes mellitus with diabetic chronic kidney disease: Secondary | ICD-10-CM | POA: Diagnosis not present

## 2019-12-26 DIAGNOSIS — R7881 Bacteremia: Secondary | ICD-10-CM | POA: Diagnosis not present

## 2019-12-26 DIAGNOSIS — I5022 Chronic systolic (congestive) heart failure: Secondary | ICD-10-CM | POA: Diagnosis not present

## 2019-12-26 DIAGNOSIS — R278 Other lack of coordination: Secondary | ICD-10-CM | POA: Diagnosis not present

## 2019-12-27 ENCOUNTER — Other Ambulatory Visit: Payer: Self-pay

## 2019-12-27 ENCOUNTER — Encounter: Payer: Self-pay | Admitting: Family Medicine

## 2019-12-27 ENCOUNTER — Ambulatory Visit: Payer: Medicare PPO | Admitting: Family Medicine

## 2019-12-27 VITALS — BP 123/61 | HR 47 | Temp 97.7°F | Ht 71.0 in | Wt 175.8 lb

## 2019-12-27 DIAGNOSIS — J3800 Paralysis of vocal cords and larynx, unspecified: Secondary | ICD-10-CM | POA: Diagnosis not present

## 2019-12-27 DIAGNOSIS — E119 Type 2 diabetes mellitus without complications: Secondary | ICD-10-CM

## 2019-12-27 MED ORDER — EMPAGLIFLOZIN 10 MG PO TABS: 10.0000 mg | ORAL_TABLET | Freq: Every day | ORAL | 11 refills | Status: DC

## 2019-12-27 NOTE — Progress Notes (Signed)
Chief Complaint  Patient presents with  . Follow-up    Blood Sugar and BP    History of Present Illness: HPI  86 year old male with recent cardiac issues including new dx of HFrEF presents for follow up Dm with recent blood sugar elevations.   He reports  FBS average 178.. [id number] Not checking alter in the day. No change in diet and activity.. he is eating well.  He is taking metformin 500 mg BID Lab Results  Component Value Date   HGBA1C 6.8 (H) 10/07/2019   Estimated Creatinine Clearance: 46.4 mL/min (by C-G formula based on SCr of 1.24 mg/dL).    HTN good control on current regimen. BP Readings from Last 3 Encounters:  12/27/19 123/61  12/11/19 138/70  12/07/19 (!) 142/68   Wt Readings from Last 3 Encounters:  12/27/19 175 lb 12 oz (79.7 kg)  12/11/19 180 lb 6 oz (81.8 kg)  12/07/19 178 lb (80.7 kg)    No CP, no SOB.   This visit occurred during the SARS-CoV-2 public health emergency.  Safety protocols were in place, including screening questions prior to the visit, additional usage of staff PPE, and extensive cleaning of exam room while observing appropriate contact time as indicated for disinfecting solutions.   COVID 19 screen:  No recent travel or known exposure to COVID19 The patient denies respiratory symptoms of COVID 19 at this time. The importance of social distancing was discussed today.     ROS    Past Medical History:  Diagnosis Date  . Abnormal glucose   . Arthritis   . Baker's cyst of knee    left  . BPH (benign prostatic hypertrophy)   . Cough    because of "tight" esophagus  . Diabetes mellitus   . Glaucoma   . HOH (hard of hearing)   . Hypertension   . Pheochromocytoma of right adrenal gland   . Ulcer   . Wears hearing aid    bilateral    reports that he has never smoked. He has never used smokeless tobacco. He reports current alcohol use of about 7.0 standard drinks of alcohol per week. He reports that he does not use drugs.    Current Outpatient Medications:  .  aspirin EC 81 MG tablet, Take 1 tablet (81 mg total) by mouth daily. Swallow whole., Disp: , Rfl:  .  atorvastatin (LIPITOR) 20 MG tablet, TAKE ONE TABLET EVERY DAY, Disp: 90 tablet, Rfl: 1 .  carvedilol (COREG) 6.25 MG tablet, TAKE 1 TABLET BY MOUTH TWICE DAILY WITH A MEAL, Disp: 180 tablet, Rfl: 1 .  finasteride (PROSCAR) 5 MG tablet, TAKE ONE TABLET EVERY DAY, Disp: 90 tablet, Rfl: 1 .  latanoprost (XALATAN) 0.005 % ophthalmic solution, Place 1 drop into both eyes at bedtime., Disp: , Rfl:  .  losartan (COZAAR) 100 MG tablet, TAKE ONE TABLET EVERY DAY, Disp: 90 tablet, Rfl: 1 .  metFORMIN (GLUCOPHAGE) 500 MG tablet, Take by mouth 2 (two) times daily with a meal., Disp: , Rfl:  .  pantoprazole (PROTONIX) 20 MG tablet, Take 1 tablet (20 mg total) by mouth daily., Disp: 30 tablet, Rfl: 6 .  polyethylene glycol powder (MIRALAX) 17 GM/SCOOP powder, Take 1 Container by mouth daily as needed for mild constipation or moderate constipation. , Disp: , Rfl:  .  spironolactone (ALDACTONE) 25 MG tablet, Take 0.5 tablets (12.5 mg total) by mouth daily., Disp: 45 tablet, Rfl: 1 .  tamsulosin (FLOMAX) 0.4 MG CAPS capsule, TAKE 2  CAPSULES EVERY DAY, Disp: 180 capsule, Rfl: 3 .  timolol (TIMOPTIC) 0.5 % ophthalmic solution, Place 1 drop into both eyes 2 (two) times daily. , Disp: , Rfl:    Observations/Objective: Blood pressure 123/61, pulse (!) 47, temperature 97.7 F (36.5 C), temperature source Temporal, height 5\' 11"  (1.803 m), weight 175 lb 12 oz (79.7 kg), SpO2 99 %.  Physical Exam Constitutional:      Appearance: He is well-developed.  HENT:     Head: Normocephalic.     Right Ear: Hearing normal.     Left Ear: Hearing normal.     Nose: Nose normal.  Neck:     Thyroid: No thyroid mass or thyromegaly.     Vascular: No carotid bruit.     Trachea: Trachea normal.  Cardiovascular:     Rate and Rhythm: Normal rate and regular rhythm.     Pulses: Normal  pulses.     Heart sounds: Heart sounds not distant. Murmur heard.  Systolic murmur is present.  No friction rub. No gallop.      Comments: No peripheral edema Pulmonary:     Effort: Pulmonary effort is normal. No respiratory distress.     Breath sounds: Normal breath sounds.  Musculoskeletal:     Right lower leg: No edema.     Left lower leg: No edema.  Skin:    General: Skin is warm and dry.     Findings: No rash.  Psychiatric:        Speech: Speech normal.        Behavior: Behavior normal.        Thought Content: Thought content normal.      Assessment and Plan  DM: add SGLT2i given CKD and HF benefit and need for glycemic control.  Start low dose, increase if needed at follow up in 2-3 weeks.    Rx written for speech therapy at [facility] for vocal cord paralysis  Eliezer Lofts, MD

## 2019-12-27 NOTE — Patient Instructions (Addendum)
In addition to  metformin twice daily, add new medication jardiance daily.   Check blood sugars in morning fasting.  Goal FBS < 120.

## 2019-12-28 DIAGNOSIS — M549 Dorsalgia, unspecified: Secondary | ICD-10-CM | POA: Diagnosis not present

## 2019-12-28 DIAGNOSIS — D2262 Melanocytic nevi of left upper limb, including shoulder: Secondary | ICD-10-CM | POA: Diagnosis not present

## 2019-12-28 DIAGNOSIS — N179 Acute kidney failure, unspecified: Secondary | ICD-10-CM | POA: Diagnosis not present

## 2019-12-28 DIAGNOSIS — G8929 Other chronic pain: Secondary | ICD-10-CM | POA: Diagnosis not present

## 2019-12-28 DIAGNOSIS — Z85828 Personal history of other malignant neoplasm of skin: Secondary | ICD-10-CM | POA: Diagnosis not present

## 2019-12-28 DIAGNOSIS — L821 Other seborrheic keratosis: Secondary | ICD-10-CM | POA: Diagnosis not present

## 2019-12-28 DIAGNOSIS — E1122 Type 2 diabetes mellitus with diabetic chronic kidney disease: Secondary | ICD-10-CM | POA: Diagnosis not present

## 2019-12-28 DIAGNOSIS — R7881 Bacteremia: Secondary | ICD-10-CM | POA: Diagnosis not present

## 2019-12-28 DIAGNOSIS — D2261 Melanocytic nevi of right upper limb, including shoulder: Secondary | ICD-10-CM | POA: Diagnosis not present

## 2019-12-28 DIAGNOSIS — D225 Melanocytic nevi of trunk: Secondary | ICD-10-CM | POA: Diagnosis not present

## 2019-12-28 DIAGNOSIS — R278 Other lack of coordination: Secondary | ICD-10-CM | POA: Diagnosis not present

## 2019-12-28 DIAGNOSIS — I5022 Chronic systolic (congestive) heart failure: Secondary | ICD-10-CM | POA: Diagnosis not present

## 2019-12-28 DIAGNOSIS — D2272 Melanocytic nevi of left lower limb, including hip: Secondary | ICD-10-CM | POA: Diagnosis not present

## 2019-12-28 DIAGNOSIS — Z741 Need for assistance with personal care: Secondary | ICD-10-CM | POA: Diagnosis not present

## 2019-12-28 DIAGNOSIS — X32XXXA Exposure to sunlight, initial encounter: Secondary | ICD-10-CM | POA: Diagnosis not present

## 2019-12-28 DIAGNOSIS — L570 Actinic keratosis: Secondary | ICD-10-CM | POA: Diagnosis not present

## 2019-12-28 DIAGNOSIS — I130 Hypertensive heart and chronic kidney disease with heart failure and stage 1 through stage 4 chronic kidney disease, or unspecified chronic kidney disease: Secondary | ICD-10-CM | POA: Diagnosis not present

## 2019-12-31 ENCOUNTER — Telehealth: Payer: Self-pay | Admitting: Family Medicine

## 2019-12-31 DIAGNOSIS — R278 Other lack of coordination: Secondary | ICD-10-CM | POA: Diagnosis not present

## 2019-12-31 DIAGNOSIS — G8929 Other chronic pain: Secondary | ICD-10-CM | POA: Diagnosis not present

## 2019-12-31 DIAGNOSIS — N179 Acute kidney failure, unspecified: Secondary | ICD-10-CM | POA: Diagnosis not present

## 2019-12-31 DIAGNOSIS — I130 Hypertensive heart and chronic kidney disease with heart failure and stage 1 through stage 4 chronic kidney disease, or unspecified chronic kidney disease: Secondary | ICD-10-CM | POA: Diagnosis not present

## 2019-12-31 DIAGNOSIS — I5022 Chronic systolic (congestive) heart failure: Secondary | ICD-10-CM | POA: Diagnosis not present

## 2019-12-31 DIAGNOSIS — R7881 Bacteremia: Secondary | ICD-10-CM | POA: Diagnosis not present

## 2019-12-31 DIAGNOSIS — E1122 Type 2 diabetes mellitus with diabetic chronic kidney disease: Secondary | ICD-10-CM | POA: Diagnosis not present

## 2019-12-31 DIAGNOSIS — Z741 Need for assistance with personal care: Secondary | ICD-10-CM | POA: Diagnosis not present

## 2019-12-31 DIAGNOSIS — M549 Dorsalgia, unspecified: Secondary | ICD-10-CM | POA: Diagnosis not present

## 2019-12-31 NOTE — Telephone Encounter (Signed)
Dustin Harrison notified by telephone that Dr. Diona Browner gave Dustin Harrison a hard copy order at his last office visit, to bring to them at [facility]  [name] will check with Mr. Artola.

## 2019-12-31 NOTE — Telephone Encounter (Signed)
Dustin Harrison @ [facility] called for status on Speech therapy order He said this was supposed to be faxed last week  Fax [phone]

## 2020-01-02 DIAGNOSIS — R7881 Bacteremia: Secondary | ICD-10-CM | POA: Diagnosis not present

## 2020-01-02 DIAGNOSIS — G8929 Other chronic pain: Secondary | ICD-10-CM | POA: Diagnosis not present

## 2020-01-02 DIAGNOSIS — E1122 Type 2 diabetes mellitus with diabetic chronic kidney disease: Secondary | ICD-10-CM | POA: Diagnosis not present

## 2020-01-02 DIAGNOSIS — N179 Acute kidney failure, unspecified: Secondary | ICD-10-CM | POA: Diagnosis not present

## 2020-01-02 DIAGNOSIS — I5022 Chronic systolic (congestive) heart failure: Secondary | ICD-10-CM | POA: Diagnosis not present

## 2020-01-02 DIAGNOSIS — I130 Hypertensive heart and chronic kidney disease with heart failure and stage 1 through stage 4 chronic kidney disease, or unspecified chronic kidney disease: Secondary | ICD-10-CM | POA: Diagnosis not present

## 2020-01-02 DIAGNOSIS — R278 Other lack of coordination: Secondary | ICD-10-CM | POA: Diagnosis not present

## 2020-01-02 DIAGNOSIS — M549 Dorsalgia, unspecified: Secondary | ICD-10-CM | POA: Diagnosis not present

## 2020-01-02 DIAGNOSIS — Z741 Need for assistance with personal care: Secondary | ICD-10-CM | POA: Diagnosis not present

## 2020-01-04 DIAGNOSIS — I5022 Chronic systolic (congestive) heart failure: Secondary | ICD-10-CM | POA: Diagnosis not present

## 2020-01-04 DIAGNOSIS — E1122 Type 2 diabetes mellitus with diabetic chronic kidney disease: Secondary | ICD-10-CM | POA: Diagnosis not present

## 2020-01-04 DIAGNOSIS — M549 Dorsalgia, unspecified: Secondary | ICD-10-CM | POA: Diagnosis not present

## 2020-01-04 DIAGNOSIS — R278 Other lack of coordination: Secondary | ICD-10-CM | POA: Diagnosis not present

## 2020-01-04 DIAGNOSIS — Z741 Need for assistance with personal care: Secondary | ICD-10-CM | POA: Diagnosis not present

## 2020-01-04 DIAGNOSIS — R7881 Bacteremia: Secondary | ICD-10-CM | POA: Diagnosis not present

## 2020-01-04 DIAGNOSIS — G8929 Other chronic pain: Secondary | ICD-10-CM | POA: Diagnosis not present

## 2020-01-04 DIAGNOSIS — N179 Acute kidney failure, unspecified: Secondary | ICD-10-CM | POA: Diagnosis not present

## 2020-01-04 DIAGNOSIS — I130 Hypertensive heart and chronic kidney disease with heart failure and stage 1 through stage 4 chronic kidney disease, or unspecified chronic kidney disease: Secondary | ICD-10-CM | POA: Diagnosis not present

## 2020-01-07 DIAGNOSIS — G8929 Other chronic pain: Secondary | ICD-10-CM | POA: Diagnosis not present

## 2020-01-07 DIAGNOSIS — I130 Hypertensive heart and chronic kidney disease with heart failure and stage 1 through stage 4 chronic kidney disease, or unspecified chronic kidney disease: Secondary | ICD-10-CM | POA: Diagnosis not present

## 2020-01-07 DIAGNOSIS — M549 Dorsalgia, unspecified: Secondary | ICD-10-CM | POA: Diagnosis not present

## 2020-01-07 DIAGNOSIS — N179 Acute kidney failure, unspecified: Secondary | ICD-10-CM | POA: Diagnosis not present

## 2020-01-07 DIAGNOSIS — R7881 Bacteremia: Secondary | ICD-10-CM | POA: Diagnosis not present

## 2020-01-07 DIAGNOSIS — Z741 Need for assistance with personal care: Secondary | ICD-10-CM | POA: Diagnosis not present

## 2020-01-07 DIAGNOSIS — I5022 Chronic systolic (congestive) heart failure: Secondary | ICD-10-CM | POA: Diagnosis not present

## 2020-01-07 DIAGNOSIS — R278 Other lack of coordination: Secondary | ICD-10-CM | POA: Diagnosis not present

## 2020-01-07 DIAGNOSIS — E1122 Type 2 diabetes mellitus with diabetic chronic kidney disease: Secondary | ICD-10-CM | POA: Diagnosis not present

## 2020-01-08 NOTE — Progress Notes (Signed)
Office Visit    Patient Name: Rayman Petrosian Date of Encounter: 01/09/2020  Primary Care Provider:  Jinny Sanders, MD Primary Cardiologist:  Ida Rogue, MD Electrophysiologist:  None   Chief Complaint    Cloy Cozzens is a 86 y.o. male with a hx of HFrEF, bradycardia, h/o pheochromocytoma, R adrenal tumor s/p R adrenalectomy, cochlear implant on left 06/2018, Dm2, chronic back pain, recent bacteremia 10/2019 s/p TEE and IV abx, CKD, CAD, aortic stenosis, syncope presents today for follow up of HFrEF.  Past Medical History    Past Medical History:  Diagnosis Date  . Abnormal glucose   . Arthritis   . Baker's cyst of knee    left  . BPH (benign prostatic hypertrophy)   . Cough    because of "tight" esophagus  . Diabetes mellitus   . Glaucoma   . HOH (hard of hearing)   . Hypertension   . Pheochromocytoma of right adrenal gland   . Ulcer   . Wears hearing aid    bilateral   Past Surgical History:  Procedure Laterality Date  . adrenaletomy    . CARDIAC CATHETERIZATION    . CATARACT EXTRACTION W/PHACO Left 10/08/2015   Procedure: CATARACT EXTRACTION PHACO AND INTRAOCULAR LENS PLACEMENT ([place]) left eye;  Surgeon: Leandrew Koyanagi, MD;  Location: [facility];  Service: Ophthalmology;  Laterality: Left;  MALYUGIN  . HERNIA REPAIR    . RIGHT/LEFT HEART CATH AND CORONARY ANGIOGRAPHY Left 11/28/2019   Procedure: RIGHT/LEFT HEART CATH AND CORONARY ANGIOGRAPHY;  Surgeon: Nelva Bush, MD;  Location: [place] [facility];  Service: Cardiovascular;  Laterality: Left;  . SQUAMOUS CELL CARCINOMA EXCISION  03-2006   left ear  . TEE WITHOUT CARDIOVERSION N/A 10/12/2019   Procedure: TRANSESOPHAGEAL ECHOCARDIOGRAM (TEE);  Surgeon: Minna Merritts, MD;  Location: [facility];  Service: Cardiovascular;  Laterality: N/A;  . TONSILLECTOMY    . XI ROBOTIC LAPAROSCOPIC ASSISTED APPENDECTOMY N/A 10/11/2019   Procedure: XI ROBOTIC ASSISTED LAPAROSCOPIC INSERTION GASTROSTOMY  TUBE;  Surgeon: Jules Husbands, MD;  Location: [facility];  Service: General;  Laterality: N/A;    Allergies  No Known Allergies  History of Present Illness    Anselm Aumiller is a 86 y.o. male with a hx of HFrEF, bradycardia, h/o pheochromocytoma, R adrenal tumor s/p R adrenalectomy, cochlear implant on left 06/2018, Dm2, chronic back pain, recent bacteremia 10/2019 s/p TEE and IV abx, CKD, CAD, aortic stenosis, syncope. He was last seen 12/11/2019  Evaluated while admitted 10/09/19 in setting of positive cultures for GBS bacteremia. Source of infection not identified. TEE with LVEF 25-35%, gr1DD, significant wall motion abnormalities with images suggestive of stress induced CM versus LAD infarct. TEE with no evidence of vegetation/endocarditis and moderate AS not visualized on TTE. He was started on feeding tube due to R vocal cord paralysis. Noted 2.2cm intraluminal density which would require further GI workup.   Seen in clinic 11/26/19 noting recent syncopal episode with LOC for 10 seconds.   Cardiac cath 11/28/19 with severe single-vessel coronary disease with 90% ostial stenosis involving small ramus intermedius that fills via right-to-left collaterals, mild to moderate nonobstructive CAD involving LAD, LCx, RCA, upper normal to mildly elevated L/R heart filling pressures, mild to moderate AS.   Seen in clinic 12/11/19 after he had transitioned back to independent living at [facility]. 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 His feeding tube had been removed and he was  working with speech therapy. He was started on Spironolactone 12.5mg  daily.   Tells me he gets dizzy at times that feels like both a lightheadedness and a spinning sensation. He does not check his blood pressure when this happens. BP at home 170/80. He checks it before his medications.  We discussed checking his blood pressure after his medications as his recent blood pressures in clinic visits have been  well controlled.  Reports no chest pain, pressure, tightness. Reports no shortness of breath at rest. Endorses some dyspnea on exertion that is stable at his baseline. Reports no lower extremity edema. He has been having some trouble with his sciatica.   He is still participating in PT 3 times per week for one hour and on alternate days he tries to walk.  EKGs/Labs/Other Studies Reviewed:   The following studies were reviewed today:  [facility] 11/28/19 Conclusions: 1. Severe single-vessel coronary artery disease with 90% ostial stenosis involving small ramus intermedius branch that also fills via right-to-left collaterals. 2. Mild to moderate, non-obstructive coronary artery disease involving the LAD, LCx, and RCA. 3. Upper normal to mildly elevated left and right heart filling pressures. 4. Hyperdynamic left ventricular contraction. 5. Mild to moderate aortic stenosis.   Recommendations: 1. Continue medical therapy and secondary prevention of coronary artery disease. 2. Consider repeat echo to confirm LVEF and aortic stenosis.  TTE 10/08/19  1. Left ventricular ejection fraction, by estimation, is 25 to 30%. The  left ventricle has severely decreased function. The left ventricle has no  regional wall motion abnormalities. There is moderate left ventricular  hypertrophy. Left ventricular  diastolic parameters are consistent with Grade I diastolic dysfunction  (impaired relaxation). There is severe akinesis of the left ventricular,  mid-apical anteroseptal wall, anterolateral wall, anterior segment, apical  segment and inferoseptal wall. Wall   motion abnormalities are suggestive of stress induced cardiomyopathy vs.  LAD infarct.   2. Right ventricular systolic function is normal. The right ventricular  size is normal. There is mildly elevated pulmonary artery systolic  pressure.   3. Left atrial size was mildly dilated.   4. The mitral valve is normal in structure. No evidence of mitral  valve  regurgitation. No evidence of mitral stenosis.   5. The aortic valve is normal in structure. Aortic valve regurgitation is  not visualized. Mild to moderate aortic valve sclerosis/calcification is  present, without any evidence of aortic stenosis.   6. The inferior vena cava is normal in size with greater than 50%  respiratory variability, suggesting right atrial pressure of 3 mmHg.   7. No clear vegetations but suboptimal study.    TEE 10/12/19  1. Left ventricular ejection fraction, by estimation, is 25 to 30%. The  left ventricle has severely decreased function. The left ventricle  demonstrates global hypokinesis.   2. Right ventricular systolic function is normal. The right ventricular  size is normal. Tricuspid regurgitation signal is inadequate for assessing  PA pressure.   3. The mitral valve is normal in structure. No evidence of mitral valve  regurgitation.   4. Aortic valve regurgitation is not visualized. Severe calcification of  aortic valve. Visually, there appears to be moderate aortic valve  stenosis.   5. Grossly, no valve vegetation noted.    EKG:  EKG is  ordered today.  The ekg ordered today demonstrates SB 53 bpm with nonspecific T wave changes - improvement in inferior T wave changes compared to previous EKG.  Recent Labs: 10/11/2019: ALT 40 10/14/2019: B Natriuretic  Peptide 1,273.0 10/15/2019: Magnesium 2.4 11/26/2019: Hemoglobin 12.0; Platelets 497 12/21/2019: BUN 43; Creatinine, Ser 1.24; Potassium 4.7; Sodium 136  Recent Lipid Panel    Component Value Date/Time   CHOL 173 11/09/2018 0849   TRIG 47.0 11/09/2018 0849   HDL 86.10 11/09/2018 0849   CHOLHDL 2 11/09/2018 0849   VLDL 9.4 11/09/2018 0849   LDLCALC 77 11/09/2018 0849   LDLDIRECT 126.2 04/05/2013 0739    Home Medications   Current Meds  Medication Sig  . aspirin EC 81 MG tablet Take 1 tablet (81 mg total) by mouth daily. Swallow whole.  Marland Kitchen atorvastatin (LIPITOR) 20 MG tablet TAKE ONE  TABLET EVERY DAY  . carvedilol (COREG) 6.25 MG tablet TAKE 1 TABLET BY MOUTH TWICE DAILY WITH A MEAL  . empagliflozin (JARDIANCE) 10 MG TABS tablet Take 1 tablet (10 mg total) by mouth daily before breakfast.  . finasteride (PROSCAR) 5 MG tablet TAKE ONE TABLET EVERY DAY  . latanoprost (XALATAN) 0.005 % ophthalmic solution Place 1 drop into both eyes at bedtime.  Marland Kitchen losartan (COZAAR) 100 MG tablet TAKE ONE TABLET EVERY DAY  . metFORMIN (GLUCOPHAGE) 500 MG tablet Take by mouth 2 (two) times daily with a meal.  . naproxen sodium (ALEVE) 220 MG tablet Take 440 mg by mouth daily.  . pantoprazole (PROTONIX) 20 MG tablet Take 1 tablet (20 mg total) by mouth daily.  . polyethylene glycol powder (MIRALAX) 17 GM/SCOOP powder Take 1 Container by mouth daily as needed for mild constipation or moderate constipation.   Marland Kitchen spironolactone (ALDACTONE) 25 MG tablet Take 0.5 tablets (12.5 mg total) by mouth daily.  . tamsulosin (FLOMAX) 0.4 MG CAPS capsule TAKE 2 CAPSULES EVERY DAY  . timolol (TIMOPTIC) 0.5 % ophthalmic solution Place 1 drop into both eyes 2 (two) times daily.     Review of Systems   Review of Systems  Constitutional: Negative for chills, fever and malaise/fatigue.  Cardiovascular: Positive for dyspnea on exertion. Negative for chest pain, leg swelling, near-syncope, orthopnea, palpitations and syncope.  Respiratory: Negative for cough, shortness of breath and wheezing.   Gastrointestinal: Negative for nausea and vomiting.  Neurological: Negative for dizziness, light-headedness and weakness.   All other systems reviewed and are otherwise negative except as noted above.  Physical Exam   VS:  BP 128/78   Pulse (!) 49   Ht 5\' 11"  (1.803 m)   Wt 177 lb 8 oz (80.5 kg)   SpO2 99%   BMI 24.76 kg/m  , BMI Body mass index is 24.76 kg/m. GEN: Well nourished, well developed, in no acute distress. HEENT: normal. Neck: Supple, no JVD, carotid bruits, or masses. Cardiac: RRR, gr 2/6 systolic  murmur, no  rubs, or gallops. No clubbing, cyanosis, edema.  Radials/DP/PT 2+ and equal bilaterally.  Respiratory:  Respirations regular and unlabored, clear to auscultation bilaterally. GI: Soft, nontender, nondistended, BS + x 4. MS: No deformity or atrophy. Skin: Warm and dry, no rash. Neuro:  Strength and sensation are intact. Psych: Normal affect.  Assessment & Plan    1. Lightheadedness/orthostatic hypotension/syncope -Reports no recurrent syncope nor near syncope.  Reports intermittent lightheadedness that is exacerbated by position changes.  Reviewed orthostatic precautions. Bradycardic on exam today, but tells me this is a longstanding history and asymptomatic. Consider cardiac monitor in future if syncope recurs.   2. CAD -reports no anginal symptoms.  Right and left heart cath 11/28/2019 with severe single-vessel CAD with 90% ostial stenosis involving small ramus intermedius branch that fills with  right to left collaterals otherwise mild to moderate nonobstructive CAD in LAD, LCx, RCA. R radial cath site healing appropriately. Recommended for medical management. Continue aspirin, statin, beta blocker.   3. Aortic stenosis -moderate by TEE 10/2019.  Mild to moderate by echo 11/28/2019.  Educated on signs and symptoms of worsening aortic stenosis.  Plan for repeat echocardiogram next week for reevaluation of LVEF and aortic stenosis.  4. HFrEF - Euvolemic and well compensated on exam. GDMT includes Coreg, Losartan, Spironolactone. No indication for loop diuretic at this time. Will defer transition to [facility] until after his upcoming echocardiogram next week.  Initiated discussion regarding possible transition.  Called his pharmacy to check on co-pay which would be $40 per month and he tells me this is reasonable.  5. HTN -BP well controlled.  Encouraged to recheck blood pressure after his medications as he has been checking blood pressure prior to medications at home.    6. Bradycardia  -longstanding history of bradycardia dating back a number of years.  He is on Coreg 6.25 mg twice daily.  As his bradycardia is asymptomatic with no near syncope, syncope, weakness, fatigue we will plan to continue Coreg at present dosing.  Would have low threshold to reduce dose.  7. HLD, LDL goal <70 -11/2018 LDL 77.  Continue atorvastatin 20 mg daily.  Plan for lipid panel with next lab collection.  8. DM2 -follows the primary care provider.  Recently started on Jardiance.  Appreciate inclusion of SGLT2 I for cardioprotective benefit.  Disposition: Echocardiogram in August. Follow up after echocardiogram with Dr. Rockey Situ or APP.   Loel Dubonnet, NP 01/09/2020, 12:13 PM

## 2020-01-09 ENCOUNTER — Other Ambulatory Visit: Payer: Self-pay

## 2020-01-09 ENCOUNTER — Ambulatory Visit (INDEPENDENT_AMBULATORY_CARE_PROVIDER_SITE_OTHER): Payer: Medicare PPO | Admitting: Family

## 2020-01-09 ENCOUNTER — Encounter: Payer: Self-pay | Admitting: Family

## 2020-01-09 VITALS — BP 128/78 | HR 49 | Ht 71.0 in | Wt 177.5 lb

## 2020-01-09 DIAGNOSIS — G8929 Other chronic pain: Secondary | ICD-10-CM | POA: Diagnosis not present

## 2020-01-09 DIAGNOSIS — I5022 Chronic systolic (congestive) heart failure: Secondary | ICD-10-CM | POA: Diagnosis not present

## 2020-01-09 DIAGNOSIS — N179 Acute kidney failure, unspecified: Secondary | ICD-10-CM | POA: Diagnosis not present

## 2020-01-09 DIAGNOSIS — E1122 Type 2 diabetes mellitus with diabetic chronic kidney disease: Secondary | ICD-10-CM | POA: Diagnosis not present

## 2020-01-09 DIAGNOSIS — R001 Bradycardia, unspecified: Secondary | ICD-10-CM | POA: Diagnosis not present

## 2020-01-09 DIAGNOSIS — I5020 Unspecified systolic (congestive) heart failure: Secondary | ICD-10-CM

## 2020-01-09 DIAGNOSIS — I25118 Atherosclerotic heart disease of native coronary artery with other forms of angina pectoris: Secondary | ICD-10-CM

## 2020-01-09 DIAGNOSIS — I10 Essential (primary) hypertension: Secondary | ICD-10-CM

## 2020-01-09 DIAGNOSIS — Z741 Need for assistance with personal care: Secondary | ICD-10-CM | POA: Diagnosis not present

## 2020-01-09 DIAGNOSIS — E785 Hyperlipidemia, unspecified: Secondary | ICD-10-CM

## 2020-01-09 DIAGNOSIS — R278 Other lack of coordination: Secondary | ICD-10-CM | POA: Diagnosis not present

## 2020-01-09 DIAGNOSIS — I130 Hypertensive heart and chronic kidney disease with heart failure and stage 1 through stage 4 chronic kidney disease, or unspecified chronic kidney disease: Secondary | ICD-10-CM | POA: Diagnosis not present

## 2020-01-09 DIAGNOSIS — I350 Nonrheumatic aortic (valve) stenosis: Secondary | ICD-10-CM

## 2020-01-09 DIAGNOSIS — M549 Dorsalgia, unspecified: Secondary | ICD-10-CM | POA: Diagnosis not present

## 2020-01-09 DIAGNOSIS — R7881 Bacteremia: Secondary | ICD-10-CM | POA: Diagnosis not present

## 2020-01-09 NOTE — Patient Instructions (Addendum)
Medication Instructions:  No medication changes today. If your heart's pumping function has not improved significantly on your upcoming echocardiogram, we will consider switching your Losartan to Entresto to further help improve your heart pumping function. We will call you 1-2 days after your echocardiogram to let you know the results. Dustin Harrison would be about $40 per month per my discussion with [facility]. There is extra information about Entresto included below.   *If you need a refill on your cardiac medications before your next appointment, please call your pharmacy*   Lab Work: None ordered today.   Testing/Procedures: Your EKG today shows shows sinus bradycardia which is a stable result. This is a slow but regular heart rhythm.   Follow-Up: At [facility], you and your health needs are our priority.  As part of our continuing mission to provide you with exceptional heart care, we have created designated Provider Care Teams.  These Care Teams include your primary Cardiologist (physician) and Advanced Practice Providers (APPs -  Physician Assistants and Nurse Practitioners) who all work together to provide you with the care you need, when you need it.   Your next appointment:   As previously scheduled.  Other Instructions   Please checking your blood pressure at least two hours after your blood pressure medications. This will let us know how your blood pressure is doing when the medication is in your system.   Sacubitril; Valsartan Oral Tablets What is this medicine? SACUBITRIL; VALSARTAN (sak UE bi tril; val SAR tan) is a combination of a neprilysin inhibitor and a an angiotensin II receptor blocker. It treats heart failure. This medicine may be used for other purposes; ask your health care provider or pharmacist if you have questions. COMMON BRAND NAME(S): Entresto What should I tell my health care provider before I take this medicine? They need to know if you have  any of these conditions:  diabetes and take a medicine that contains aliskiren  kidney disease  liver disease  an unusual or allergic reaction to sacubitril; valsartan, drugs called angiotensin converting enzyme (ACE) inhibitors, angiotensin II receptor blockers (ARBs), other medicines, foods, dyes, or preservatives  pregnant or trying to get pregnant  breast-feeding How should I use this medicine? Take this drug by mouth. Take it as directed on the prescription label at the same time every day. You can take it with or without food. If it upsets your stomach, take it with food. Keep taking it unless your health care provider tells you to stop. Talk to your health care provider about the use of this drug in children. While it may be prescribed for children as young as 1 for selected conditions, precautions do apply. Overdosage: If you think you have taken too much of this medicine contact a poison control center or emergency room at once. NOTE: This medicine is only for you. Do not share this medicine with others. What if I miss a dose? If you miss a dose, take it as soon as you can. If it is almost time for your next dose, take only that dose. Do not take double or extra doses. What may interact with this medicine? Do not take this medicine with any of the following medicines:  aliskiren if you have diabetes  angiotensin-converting enzyme (ACE) inhibitors, like benazepril, captopril, enalapril, fosinopril, lisinopril, or ramipril This medicine may also interact with the following medicines:  angiotensin II receptor blockers (ARBs) like azilsartan, candesartan, eprosartan, irbesartan, losartan, olmesartan, telmisartan, or valsartan  lithium  NSAIDS, medicines for pain and inflammation, like ibuprofen or naproxen  potassium-sparing diuretics like amiloride, spironolactone, and triamterene  potassium supplements This list may not describe all possible interactions. Give your health  care provider a list of all the medicines, herbs, non-prescription drugs, or dietary supplements you use. Also tell them if you smoke, drink alcohol, or use illegal drugs. Some items may interact with your medicine. What should I watch for while using this medicine? Tell your doctor or healthcare professional if your symptoms do not start to get better or if they get worse. Do not become pregnant while taking this medicine. Women should inform their doctor if they wish to become pregnant or think they might be pregnant. There is a potential for serious side effects to an unborn child. Talk to your health care professional or pharmacist for more information. You may get dizzy. Do not drive, use machinery, or do anything that needs mental alertness until you know how this medicine affects you. Do not stand or sit up quickly, especially if you are an older patient. This reduces the risk of dizzy or fainting spells. Avoid alcoholic drinks; they can make you more dizzy. What side effects may I notice from receiving this medicine? Side effects that you should report to your doctor or health care professional as soon as possible:  allergic reactions like skin rash, itching or hives, swelling of the face, lips, or tongue  signs and symptoms of increased potassium like muscle weakness; chest pain; or fast, irregular heartbeat  signs and symptoms of kidney injury like trouble passing urine or change in the amount of urine  signs and symptoms of low blood pressure like feeling dizzy or lightheaded, or if you develop extreme fatigue Side effects that usually do not require medical attention (report to your doctor or health care professional if they continue or are bothersome):  cough This list may not describe all possible side effects. Call your doctor for medical advice about side effects. You may report side effects to FDA at 1-800-FDA-1088. Where should I keep my medicine? Keep out of the reach of  children and pets. Store at room temperature between 20 and 25 degrees C (68 and 77 degrees F). Protect from moisture. Keep the container tightly closed. Throw away any unused drug after the expiration date. NOTE: This sheet is a summary. It may not cover all possible information. If you have questions about this medicine, talk to your doctor, pharmacist, or health care provider.  2020 Elsevier/Gold Standard (2018-12-27 16:03:07)

## 2020-01-11 DIAGNOSIS — I5022 Chronic systolic (congestive) heart failure: Secondary | ICD-10-CM | POA: Diagnosis not present

## 2020-01-11 DIAGNOSIS — Z741 Need for assistance with personal care: Secondary | ICD-10-CM | POA: Diagnosis not present

## 2020-01-11 DIAGNOSIS — E1122 Type 2 diabetes mellitus with diabetic chronic kidney disease: Secondary | ICD-10-CM | POA: Diagnosis not present

## 2020-01-11 DIAGNOSIS — R7881 Bacteremia: Secondary | ICD-10-CM | POA: Diagnosis not present

## 2020-01-11 DIAGNOSIS — M549 Dorsalgia, unspecified: Secondary | ICD-10-CM | POA: Diagnosis not present

## 2020-01-11 DIAGNOSIS — I130 Hypertensive heart and chronic kidney disease with heart failure and stage 1 through stage 4 chronic kidney disease, or unspecified chronic kidney disease: Secondary | ICD-10-CM | POA: Diagnosis not present

## 2020-01-11 DIAGNOSIS — N179 Acute kidney failure, unspecified: Secondary | ICD-10-CM | POA: Diagnosis not present

## 2020-01-11 DIAGNOSIS — G8929 Other chronic pain: Secondary | ICD-10-CM | POA: Diagnosis not present

## 2020-01-11 DIAGNOSIS — R278 Other lack of coordination: Secondary | ICD-10-CM | POA: Diagnosis not present

## 2020-01-14 DIAGNOSIS — R278 Other lack of coordination: Secondary | ICD-10-CM | POA: Diagnosis not present

## 2020-01-14 DIAGNOSIS — R7881 Bacteremia: Secondary | ICD-10-CM | POA: Diagnosis not present

## 2020-01-14 DIAGNOSIS — I130 Hypertensive heart and chronic kidney disease with heart failure and stage 1 through stage 4 chronic kidney disease, or unspecified chronic kidney disease: Secondary | ICD-10-CM | POA: Diagnosis not present

## 2020-01-14 DIAGNOSIS — G8929 Other chronic pain: Secondary | ICD-10-CM | POA: Diagnosis not present

## 2020-01-14 DIAGNOSIS — I5022 Chronic systolic (congestive) heart failure: Secondary | ICD-10-CM | POA: Diagnosis not present

## 2020-01-14 DIAGNOSIS — Z741 Need for assistance with personal care: Secondary | ICD-10-CM | POA: Diagnosis not present

## 2020-01-14 DIAGNOSIS — M549 Dorsalgia, unspecified: Secondary | ICD-10-CM | POA: Diagnosis not present

## 2020-01-14 DIAGNOSIS — N179 Acute kidney failure, unspecified: Secondary | ICD-10-CM | POA: Diagnosis not present

## 2020-01-14 DIAGNOSIS — E1122 Type 2 diabetes mellitus with diabetic chronic kidney disease: Secondary | ICD-10-CM | POA: Diagnosis not present

## 2020-01-15 DIAGNOSIS — H401132 Primary open-angle glaucoma, bilateral, moderate stage: Secondary | ICD-10-CM | POA: Diagnosis not present

## 2020-01-16 ENCOUNTER — Ambulatory Visit (INDEPENDENT_AMBULATORY_CARE_PROVIDER_SITE_OTHER): Payer: Medicare PPO

## 2020-01-16 ENCOUNTER — Other Ambulatory Visit: Payer: Self-pay

## 2020-01-16 DIAGNOSIS — I5020 Unspecified systolic (congestive) heart failure: Secondary | ICD-10-CM

## 2020-01-16 DIAGNOSIS — I350 Nonrheumatic aortic (valve) stenosis: Secondary | ICD-10-CM | POA: Diagnosis not present

## 2020-01-16 DIAGNOSIS — I77810 Thoracic aortic ectasia: Secondary | ICD-10-CM

## 2020-01-16 HISTORY — DX: Thoracic aortic ectasia: I77.810

## 2020-01-16 LAB — ECHOCARDIOGRAM COMPLETE
AR max vel: 2.44 cm2
AV Area VTI: 2.47 cm2
AV Area mean vel: 2.46 cm2
AV Mean grad: 6 mmHg
AV Peak grad: 12 mmHg
Ao pk vel: 1.73 m/s
Area-P 1/2: 3.43 cm2
Calc EF: 60.2 %
S' Lateral: 3.1 cm
Single Plane A2C EF: 60.6 %
Single Plane A4C EF: 58.5 %

## 2020-01-17 DIAGNOSIS — G8929 Other chronic pain: Secondary | ICD-10-CM | POA: Diagnosis not present

## 2020-01-17 DIAGNOSIS — N179 Acute kidney failure, unspecified: Secondary | ICD-10-CM | POA: Diagnosis not present

## 2020-01-17 DIAGNOSIS — Z741 Need for assistance with personal care: Secondary | ICD-10-CM | POA: Diagnosis not present

## 2020-01-17 DIAGNOSIS — R278 Other lack of coordination: Secondary | ICD-10-CM | POA: Diagnosis not present

## 2020-01-17 DIAGNOSIS — R7881 Bacteremia: Secondary | ICD-10-CM | POA: Diagnosis not present

## 2020-01-17 DIAGNOSIS — I130 Hypertensive heart and chronic kidney disease with heart failure and stage 1 through stage 4 chronic kidney disease, or unspecified chronic kidney disease: Secondary | ICD-10-CM | POA: Diagnosis not present

## 2020-01-17 DIAGNOSIS — M549 Dorsalgia, unspecified: Secondary | ICD-10-CM | POA: Diagnosis not present

## 2020-01-17 DIAGNOSIS — E1122 Type 2 diabetes mellitus with diabetic chronic kidney disease: Secondary | ICD-10-CM | POA: Diagnosis not present

## 2020-01-17 DIAGNOSIS — I5022 Chronic systolic (congestive) heart failure: Secondary | ICD-10-CM | POA: Diagnosis not present

## 2020-01-18 ENCOUNTER — Ambulatory Visit: Payer: Medicare PPO | Admitting: Family Medicine

## 2020-01-18 ENCOUNTER — Other Ambulatory Visit: Payer: Self-pay

## 2020-01-18 VITALS — BP 124/60 | HR 50 | Temp 97.4°F | Ht 71.0 in | Wt 172.5 lb

## 2020-01-18 DIAGNOSIS — G8929 Other chronic pain: Secondary | ICD-10-CM

## 2020-01-18 DIAGNOSIS — Z741 Need for assistance with personal care: Secondary | ICD-10-CM | POA: Diagnosis not present

## 2020-01-18 DIAGNOSIS — I5020 Unspecified systolic (congestive) heart failure: Secondary | ICD-10-CM | POA: Diagnosis not present

## 2020-01-18 DIAGNOSIS — E1122 Type 2 diabetes mellitus with diabetic chronic kidney disease: Secondary | ICD-10-CM | POA: Diagnosis not present

## 2020-01-18 DIAGNOSIS — E119 Type 2 diabetes mellitus without complications: Secondary | ICD-10-CM

## 2020-01-18 DIAGNOSIS — N179 Acute kidney failure, unspecified: Secondary | ICD-10-CM | POA: Diagnosis not present

## 2020-01-18 DIAGNOSIS — R7881 Bacteremia: Secondary | ICD-10-CM | POA: Diagnosis not present

## 2020-01-18 DIAGNOSIS — E440 Moderate protein-calorie malnutrition: Secondary | ICD-10-CM

## 2020-01-18 DIAGNOSIS — I130 Hypertensive heart and chronic kidney disease with heart failure and stage 1 through stage 4 chronic kidney disease, or unspecified chronic kidney disease: Secondary | ICD-10-CM | POA: Diagnosis not present

## 2020-01-18 DIAGNOSIS — R278 Other lack of coordination: Secondary | ICD-10-CM | POA: Diagnosis not present

## 2020-01-18 DIAGNOSIS — M5441 Lumbago with sciatica, right side: Secondary | ICD-10-CM

## 2020-01-18 DIAGNOSIS — M549 Dorsalgia, unspecified: Secondary | ICD-10-CM | POA: Diagnosis not present

## 2020-01-18 DIAGNOSIS — I5022 Chronic systolic (congestive) heart failure: Secondary | ICD-10-CM | POA: Diagnosis not present

## 2020-01-18 MED ORDER — MELOXICAM 7.5 MG PO TABS
7.5000 mg | ORAL_TABLET | Freq: Every day | ORAL | 0 refills | Status: DC
Start: 2020-01-18 — End: 2020-01-28

## 2020-01-18 NOTE — Patient Instructions (Addendum)
We will set up referral to PM and R doctor.  Stop aleve.  Start a trial of meloxicam low.  Call if not helping at all.   Change to Glucerna instead of Ensure.  Continue  Jardiance at current dose for now.

## 2020-01-21 DIAGNOSIS — Z741 Need for assistance with personal care: Secondary | ICD-10-CM | POA: Diagnosis not present

## 2020-01-21 DIAGNOSIS — E1122 Type 2 diabetes mellitus with diabetic chronic kidney disease: Secondary | ICD-10-CM | POA: Diagnosis not present

## 2020-01-21 DIAGNOSIS — M549 Dorsalgia, unspecified: Secondary | ICD-10-CM | POA: Diagnosis not present

## 2020-01-21 DIAGNOSIS — I130 Hypertensive heart and chronic kidney disease with heart failure and stage 1 through stage 4 chronic kidney disease, or unspecified chronic kidney disease: Secondary | ICD-10-CM | POA: Diagnosis not present

## 2020-01-21 DIAGNOSIS — G8929 Other chronic pain: Secondary | ICD-10-CM | POA: Diagnosis not present

## 2020-01-21 DIAGNOSIS — R278 Other lack of coordination: Secondary | ICD-10-CM | POA: Diagnosis not present

## 2020-01-21 DIAGNOSIS — R7881 Bacteremia: Secondary | ICD-10-CM | POA: Diagnosis not present

## 2020-01-21 DIAGNOSIS — N179 Acute kidney failure, unspecified: Secondary | ICD-10-CM | POA: Diagnosis not present

## 2020-01-21 DIAGNOSIS — I5022 Chronic systolic (congestive) heart failure: Secondary | ICD-10-CM | POA: Diagnosis not present

## 2020-01-23 DIAGNOSIS — R278 Other lack of coordination: Secondary | ICD-10-CM | POA: Diagnosis not present

## 2020-01-23 DIAGNOSIS — E1122 Type 2 diabetes mellitus with diabetic chronic kidney disease: Secondary | ICD-10-CM | POA: Diagnosis not present

## 2020-01-23 DIAGNOSIS — G8929 Other chronic pain: Secondary | ICD-10-CM | POA: Diagnosis not present

## 2020-01-23 DIAGNOSIS — Z741 Need for assistance with personal care: Secondary | ICD-10-CM | POA: Diagnosis not present

## 2020-01-23 DIAGNOSIS — R7881 Bacteremia: Secondary | ICD-10-CM | POA: Diagnosis not present

## 2020-01-23 DIAGNOSIS — N179 Acute kidney failure, unspecified: Secondary | ICD-10-CM | POA: Diagnosis not present

## 2020-01-23 DIAGNOSIS — I130 Hypertensive heart and chronic kidney disease with heart failure and stage 1 through stage 4 chronic kidney disease, or unspecified chronic kidney disease: Secondary | ICD-10-CM | POA: Diagnosis not present

## 2020-01-23 DIAGNOSIS — I5022 Chronic systolic (congestive) heart failure: Secondary | ICD-10-CM | POA: Diagnosis not present

## 2020-01-23 DIAGNOSIS — M549 Dorsalgia, unspecified: Secondary | ICD-10-CM | POA: Diagnosis not present

## 2020-01-25 DIAGNOSIS — G8929 Other chronic pain: Secondary | ICD-10-CM | POA: Diagnosis not present

## 2020-01-25 DIAGNOSIS — E1122 Type 2 diabetes mellitus with diabetic chronic kidney disease: Secondary | ICD-10-CM | POA: Diagnosis not present

## 2020-01-25 DIAGNOSIS — R7881 Bacteremia: Secondary | ICD-10-CM | POA: Diagnosis not present

## 2020-01-25 DIAGNOSIS — I130 Hypertensive heart and chronic kidney disease with heart failure and stage 1 through stage 4 chronic kidney disease, or unspecified chronic kidney disease: Secondary | ICD-10-CM | POA: Diagnosis not present

## 2020-01-25 DIAGNOSIS — H401132 Primary open-angle glaucoma, bilateral, moderate stage: Secondary | ICD-10-CM | POA: Diagnosis not present

## 2020-01-25 DIAGNOSIS — I5022 Chronic systolic (congestive) heart failure: Secondary | ICD-10-CM | POA: Diagnosis not present

## 2020-01-25 DIAGNOSIS — M549 Dorsalgia, unspecified: Secondary | ICD-10-CM | POA: Diagnosis not present

## 2020-01-25 DIAGNOSIS — R278 Other lack of coordination: Secondary | ICD-10-CM | POA: Diagnosis not present

## 2020-01-25 DIAGNOSIS — Z741 Need for assistance with personal care: Secondary | ICD-10-CM | POA: Diagnosis not present

## 2020-01-25 DIAGNOSIS — N179 Acute kidney failure, unspecified: Secondary | ICD-10-CM | POA: Diagnosis not present

## 2020-01-25 LAB — HM DIABETES EYE EXAM

## 2020-01-28 ENCOUNTER — Other Ambulatory Visit: Payer: Self-pay | Admitting: Family Medicine

## 2020-01-28 ENCOUNTER — Encounter: Payer: Self-pay | Admitting: Family Medicine

## 2020-01-28 DIAGNOSIS — R7881 Bacteremia: Secondary | ICD-10-CM | POA: Diagnosis not present

## 2020-01-28 DIAGNOSIS — R278 Other lack of coordination: Secondary | ICD-10-CM | POA: Diagnosis not present

## 2020-01-28 DIAGNOSIS — G8929 Other chronic pain: Secondary | ICD-10-CM | POA: Diagnosis not present

## 2020-01-28 DIAGNOSIS — Z741 Need for assistance with personal care: Secondary | ICD-10-CM | POA: Diagnosis not present

## 2020-01-28 DIAGNOSIS — N179 Acute kidney failure, unspecified: Secondary | ICD-10-CM | POA: Diagnosis not present

## 2020-01-28 DIAGNOSIS — I5022 Chronic systolic (congestive) heart failure: Secondary | ICD-10-CM | POA: Diagnosis not present

## 2020-01-28 DIAGNOSIS — M549 Dorsalgia, unspecified: Secondary | ICD-10-CM | POA: Diagnosis not present

## 2020-01-28 DIAGNOSIS — I130 Hypertensive heart and chronic kidney disease with heart failure and stage 1 through stage 4 chronic kidney disease, or unspecified chronic kidney disease: Secondary | ICD-10-CM | POA: Diagnosis not present

## 2020-01-28 DIAGNOSIS — E1122 Type 2 diabetes mellitus with diabetic chronic kidney disease: Secondary | ICD-10-CM | POA: Diagnosis not present

## 2020-01-28 NOTE — Telephone Encounter (Signed)
Last office visit 01/18/2020 for chronic right-sided low back pain with sciatica.  Last refilled 01/18/2020 for #30 with no refills.  Patient states he is taking 2 tablets daily.  Next Appt: 03/20/2020 for DM.

## 2020-01-29 ENCOUNTER — Other Ambulatory Visit: Payer: Self-pay

## 2020-01-29 MED ORDER — MIRTAZAPINE 15 MG PO TABS: 15.0000 mg | ORAL_TABLET | Freq: Every day | ORAL | 5 refills | Status: DC

## 2020-01-29 MED ORDER — MELOXICAM 7.5 MG PO TABS: 7.5000 mg | ORAL_TABLET | Freq: Two times a day (BID) | ORAL | 0 refills | Status: DC

## 2020-01-29 NOTE — Telephone Encounter (Signed)
Pt left v/m requesting higher dose of meloxicam since pt is taking more. I spoke with pt and pt is taking meloxicam 7.5 mg taking 2 tabs of meloxicam 7.5 mg in the evening. Pt request new instructions on [facility] Pt said Dr Diona Browner is aware of how he is taking med. pts last appt with Dr Diona Browner was 01/18/20.

## 2020-01-30 DIAGNOSIS — R278 Other lack of coordination: Secondary | ICD-10-CM | POA: Diagnosis not present

## 2020-01-30 DIAGNOSIS — Z741 Need for assistance with personal care: Secondary | ICD-10-CM | POA: Diagnosis not present

## 2020-01-30 DIAGNOSIS — E1122 Type 2 diabetes mellitus with diabetic chronic kidney disease: Secondary | ICD-10-CM | POA: Diagnosis not present

## 2020-01-30 DIAGNOSIS — I5022 Chronic systolic (congestive) heart failure: Secondary | ICD-10-CM | POA: Diagnosis not present

## 2020-01-30 DIAGNOSIS — M549 Dorsalgia, unspecified: Secondary | ICD-10-CM | POA: Diagnosis not present

## 2020-01-30 DIAGNOSIS — R7881 Bacteremia: Secondary | ICD-10-CM | POA: Diagnosis not present

## 2020-01-30 DIAGNOSIS — G8929 Other chronic pain: Secondary | ICD-10-CM | POA: Diagnosis not present

## 2020-01-30 DIAGNOSIS — N179 Acute kidney failure, unspecified: Secondary | ICD-10-CM | POA: Diagnosis not present

## 2020-01-30 DIAGNOSIS — I130 Hypertensive heart and chronic kidney disease with heart failure and stage 1 through stage 4 chronic kidney disease, or unspecified chronic kidney disease: Secondary | ICD-10-CM | POA: Diagnosis not present

## 2020-02-01 DIAGNOSIS — M549 Dorsalgia, unspecified: Secondary | ICD-10-CM | POA: Diagnosis not present

## 2020-02-01 DIAGNOSIS — I130 Hypertensive heart and chronic kidney disease with heart failure and stage 1 through stage 4 chronic kidney disease, or unspecified chronic kidney disease: Secondary | ICD-10-CM | POA: Diagnosis not present

## 2020-02-01 DIAGNOSIS — R278 Other lack of coordination: Secondary | ICD-10-CM | POA: Diagnosis not present

## 2020-02-01 DIAGNOSIS — Z741 Need for assistance with personal care: Secondary | ICD-10-CM | POA: Diagnosis not present

## 2020-02-01 DIAGNOSIS — R7881 Bacteremia: Secondary | ICD-10-CM | POA: Diagnosis not present

## 2020-02-01 DIAGNOSIS — E1122 Type 2 diabetes mellitus with diabetic chronic kidney disease: Secondary | ICD-10-CM | POA: Diagnosis not present

## 2020-02-01 DIAGNOSIS — I5022 Chronic systolic (congestive) heart failure: Secondary | ICD-10-CM | POA: Diagnosis not present

## 2020-02-01 DIAGNOSIS — N179 Acute kidney failure, unspecified: Secondary | ICD-10-CM | POA: Diagnosis not present

## 2020-02-01 DIAGNOSIS — G8929 Other chronic pain: Secondary | ICD-10-CM | POA: Diagnosis not present

## 2020-02-06 NOTE — Assessment & Plan Note (Signed)
Avoid higher sugar fruits like bannana and orange. Avoid juices.  Keep up with protein in diet but try to lower carbohydrate to 30 grams  For breakfast and lunch and 45 grams at dinner. 15 grams  for snacks.  Continue metformin 500 mg twice daily.

## 2020-02-06 NOTE — Assessment & Plan Note (Signed)
Followed by cardiology 

## 2020-02-06 NOTE — Assessment & Plan Note (Addendum)
Keep up with protein in diet but try to lower carbohydrate to 30 grams  For breakfast and lunch and 45 grams at dinner. 15 grams  for snacks. Avoid  more weight loss and while controlling diabetes.

## 2020-02-15 ENCOUNTER — Ambulatory Visit: Payer: Medicare PPO | Admitting: Family

## 2020-02-15 ENCOUNTER — Other Ambulatory Visit: Payer: Self-pay

## 2020-02-15 ENCOUNTER — Encounter: Payer: Self-pay | Admitting: Family

## 2020-02-15 VITALS — BP 150/80 | HR 48 | Ht 71.0 in | Wt 174.0 lb

## 2020-02-15 DIAGNOSIS — I10 Essential (primary) hypertension: Secondary | ICD-10-CM

## 2020-02-15 DIAGNOSIS — R001 Bradycardia, unspecified: Secondary | ICD-10-CM

## 2020-02-15 DIAGNOSIS — I5020 Unspecified systolic (congestive) heart failure: Secondary | ICD-10-CM

## 2020-02-15 DIAGNOSIS — I25118 Atherosclerotic heart disease of native coronary artery with other forms of angina pectoris: Secondary | ICD-10-CM

## 2020-02-15 DIAGNOSIS — E785 Hyperlipidemia, unspecified: Secondary | ICD-10-CM

## 2020-02-15 NOTE — Patient Instructions (Addendum)
Medication Instructions:  No medication changes today.   *If you need a refill on your cardiac medications before your next appointment, please call your pharmacy*   Lab Work: No lab work today  Testing/Procedures: Your EKG today shows sinus bradycardia which is a stable finding.  Follow-Up: At [facility], you and your health needs are our priority.  As part of our continuing mission to provide you with exceptional heart care, we have created designated Provider Care Teams.  These Care Teams include your primary Cardiologist (physician) and Advanced Practice Providers (APPs -  Physician Assistants and Nurse Practitioners) who all work together to provide you with the care you need, when you need it.  We recommend signing up for the patient portal called "MyChart".  Sign up information is provided on this After Visit Summary.  MyChart is used to connect with patients for Virtual Visits (Telemedicine).  Patients are able to view lab/test results, encounter notes, upcoming appointments, etc.  Non-urgent messages can be sent to your provider as well.   To learn more about what you can do with MyChart, go to [email or web address].    Your next appointment:   4 month(s)  The format for your next appointment:   In Person  Provider:    You may see Ida Rogue, MD or one of the following Advanced Practice Providers on your designated Care Team:    Murray Hodgkins, NP  Laurann Montana, NP  Christell Faith, PA-C  Marrianne Mood, PA-C  Other Instructions  Call our office if you have any worsening lightheadedness or dizziness.  Your echocardiogram shows your heart pumping function has come back to normal which is a great result!!

## 2020-02-15 NOTE — Progress Notes (Signed)
Office Visit    Patient Name: Dustin Harrison Date of Encounter: 02/15/2020  Primary Care Provider:  Jinny Sanders, MD Primary Cardiologist:  Ida Rogue, MD Electrophysiologist:  None   Chief Complaint    Dustin Harrison is a 86 y.o. male with a hx of HFrEF, bradycardia, h/o pheochromocytoma, R adrenal tumor s/p R adrenalectomy, cochlear implant on left 06/2018, Dm2, chronic back pain, recent bacteremia 10/2019 s/p TEE and IV abx, CKD, CAD, aortic stenosis, syncope presents today for follow up after echocardiogram.  Past Medical History    Past Medical History:  Diagnosis Date  . Abnormal glucose   . Arthritis   . Baker's cyst of knee    left  . BPH (benign prostatic hypertrophy)   . Cough    because of "tight" esophagus  . Diabetes mellitus   . Glaucoma   . HOH (hard of hearing)   . Hypertension   . Pheochromocytoma of right adrenal gland   . Ulcer   . Wears hearing aid    bilateral   Past Surgical History:  Procedure Laterality Date  . adrenaletomy    . CARDIAC CATHETERIZATION    . CATARACT EXTRACTION W/PHACO Left 10/08/2015   Procedure: CATARACT EXTRACTION PHACO AND INTRAOCULAR LENS PLACEMENT ([place]) left eye;  Surgeon: Leandrew Koyanagi, MD;  Location: [facility];  Service: Ophthalmology;  Laterality: Left;  MALYUGIN  . HERNIA REPAIR    . RIGHT/LEFT HEART CATH AND CORONARY ANGIOGRAPHY Left 11/28/2019   Procedure: RIGHT/LEFT HEART CATH AND CORONARY ANGIOGRAPHY;  Surgeon: Nelva Bush, MD;  Location: [facility];  Service: Cardiovascular;  Laterality: Left;  . SQUAMOUS CELL CARCINOMA EXCISION  03-2006   left ear  . TEE WITHOUT CARDIOVERSION N/A 10/12/2019   Procedure: TRANSESOPHAGEAL ECHOCARDIOGRAM (TEE);  Surgeon: Minna Merritts, MD;  Location: [facility];  Service: Cardiovascular;  Laterality: N/A;  . TONSILLECTOMY    . XI ROBOTIC LAPAROSCOPIC ASSISTED APPENDECTOMY N/A 10/11/2019   Procedure: XI ROBOTIC ASSISTED LAPAROSCOPIC INSERTION  GASTROSTOMY TUBE;  Surgeon: Jules Husbands, MD;  Location: [facility];  Service: General;  Laterality: N/A;    Allergies  No Known Allergies  History of Present Illness    Dustin Harrison is a 86 y.o. male with a hx of HFrEF, bradycardia, h/o pheochromocytoma, R adrenal tumor s/p R adrenalectomy, cochlear implant on left 06/2018, Dm2, chronic back pain, recent bacteremia 10/2019 s/p TEE and IV abx, CKD, CAD, aortic stenosis, syncope. He was last seen 01/09/2020  Evaluated while admitted 10/09/19 in setting of positive cultures for GBS bacteremia. Source of infection not identified. TEE with LVEF 25-35%, gr1DD, significant wall motion abnormalities with images suggestive of stress induced CM versus LAD infarct. TEE with no evidence of vegetation/endocarditis and moderate AS not visualized on TTE. He was started on feeding tube due to R vocal cord paralysis. Noted 2.2cm intraluminal density which would require further GI workup.   Seen in clinic 11/26/19 noting recent syncopal episode with LOC for 10 seconds.   Cardiac cath 11/28/19 with severe single-vessel coronary disease with 90% ostial stenosis involving small ramus intermedius that fills via right-to-left collaterals, mild to moderate nonobstructive CAD involving LAD, LCx, RCA, upper normal to mildly elevated L/R heart filling pressures, mild to moderate AS.   Seen in clinic 12/11/19 after he had transitioned back to independent living at [facility]. 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 His feeding tube had been removed and he was  working with speech therapy. He was started on Spironolactone 12.5mg  daily.   He was seen in clinic 01/09/2020.  He was doing well with NYHA I symptoms.  No changes were made at that time.  He was recommended for repeat echocardiogram.  Underwent echocardiogram 01/14/2020 with EF  normalized with LVEF 60 to 65%, no RWMA, mild LVH, RV normal size and function, trivial MR, mild dilation of ascending  aorta 39 mm.  He presents today for follow-up. Exercising at the pool with an exercise class.  Enjoying this very much and is pleased that his energy level has improved. Reports no shortness of breath nor dyspnea on exertion. Reports no chest pain, pressure, or tightness. No edema, orthopnea, PND. Reports no palpitations.  Reports no lightheadedness, dizziness, near-syncope, syncope. EKGs/Labs/Other Studies Reviewed:   The following studies were reviewed today:  [facility] 11/28/19 Conclusions: 1. Severe single-vessel coronary artery disease with 90% ostial stenosis involving small ramus intermedius branch that also fills via right-to-left collaterals. 2. Mild to moderate, non-obstructive coronary artery disease involving the LAD, LCx, and RCA. 3. Upper normal to mildly elevated left and right heart filling pressures. 4. Hyperdynamic left ventricular contraction. 5. Mild to moderate aortic stenosis.   Recommendations: 1. Continue medical therapy and secondary prevention of coronary artery disease. 2. Consider repeat echo to confirm LVEF and aortic stenosis.  TTE 10/08/19  1. Left ventricular ejection fraction, by estimation, is 25 to 30%. The  left ventricle has severely decreased function. The left ventricle has no  regional wall motion abnormalities. There is moderate left ventricular  hypertrophy. Left ventricular  diastolic parameters are consistent with Grade I diastolic dysfunction  (impaired relaxation). There is severe akinesis of the left ventricular,  mid-apical anteroseptal wall, anterolateral wall, anterior segment, apical  segment and inferoseptal wall. Wall   motion abnormalities are suggestive of stress induced cardiomyopathy vs.  LAD infarct.   2. Right ventricular systolic function is normal. The right ventricular  size is normal. There is mildly elevated pulmonary artery systolic  pressure.   3. Left atrial size was mildly dilated.   4. The mitral valve is normal in  structure. No evidence of mitral valve  regurgitation. No evidence of mitral stenosis.   5. The aortic valve is normal in structure. Aortic valve regurgitation is  not visualized. Mild to moderate aortic valve sclerosis/calcification is  present, without any evidence of aortic stenosis.   6. The inferior vena cava is normal in size with greater than 50%  respiratory variability, suggesting right atrial pressure of 3 mmHg.   7. No clear vegetations but suboptimal study.    TEE 10/12/19  1. Left ventricular ejection fraction, by estimation, is 25 to 30%. The  left ventricle has severely decreased function. The left ventricle  demonstrates global hypokinesis.   2. Right ventricular systolic function is normal. The right ventricular  size is normal. Tricuspid regurgitation signal is inadequate for assessing  PA pressure.   3. The mitral valve is normal in structure. No evidence of mitral valve  regurgitation.   4. Aortic valve regurgitation is not visualized. Severe calcification of  aortic valve. Visually, there appears to be moderate aortic valve  stenosis.   5. Grossly, no valve vegetation noted.    EKG:  EKG is  ordered today.  The ekg ordered today demonstrates SB 48 bpm with occasional PAC.  Recent Labs: 10/11/2019: ALT 40 10/14/2019: B Natriuretic Peptide 1,273.0 10/15/2019: Magnesium 2.4 11/26/2019: Hemoglobin 12.0; Platelets 497 12/21/2019: BUN 43; Creatinine, Ser 1.24;  Potassium 4.7; Sodium 136  Recent Lipid Panel    Component Value Date/Time   CHOL 173 11/09/2018 0849   TRIG 47.0 11/09/2018 0849   HDL 86.10 11/09/2018 0849   CHOLHDL 2 11/09/2018 0849   VLDL 9.4 11/09/2018 0849   LDLCALC 77 11/09/2018 0849   LDLDIRECT 126.2 04/05/2013 0739    Home Medications   Current Meds  Medication Sig  . aspirin EC 81 MG tablet Take 1 tablet (81 mg total) by mouth daily. Swallow whole.  Marland Kitchen atorvastatin (LIPITOR) 20 MG tablet TAKE ONE TABLET EVERY DAY  . carvedilol (COREG) 6.25 MG  tablet TAKE 1 TABLET BY MOUTH TWICE DAILY WITH A MEAL  . cloNIDine (CATAPRES - DOSED IN MG/24 HR) 0.1 mg/24hr patch 0.1 mg once a week.  . empagliflozin (JARDIANCE) 10 MG TABS tablet Take 1 tablet (10 mg total) by mouth daily before breakfast.  . finasteride (PROSCAR) 5 MG tablet TAKE ONE TABLET EVERY DAY  . latanoprost (XALATAN) 0.005 % ophthalmic solution Place 1 drop into both eyes at bedtime.  . lidocaine (LIDODERM) 5 % Place 1 patch onto the skin at bedtime. Remove & Discard patch within 12 hours or as directed by MD  . losartan (COZAAR) 100 MG tablet TAKE ONE TABLET EVERY DAY  . meloxicam (MOBIC) 7.5 MG tablet Take 1 tablet (7.5 mg total) by mouth in the morning and at bedtime.  . metFORMIN (GLUCOPHAGE) 500 MG tablet Take by mouth 2 (two) times daily with a meal.  . mirtazapine (REMERON) 15 MG tablet Take 1 tablet (15 mg total) by mouth at bedtime.  Marland Kitchen NOVOLOG 100 UNIT/ML injection SMARTSIG:4 Unit(s) SUB-Q 3 Times Daily  . pantoprazole (PROTONIX) 20 MG tablet Take 1 tablet (20 mg total) by mouth daily.  . polyethylene glycol powder (MIRALAX) 17 GM/SCOOP powder Take 1 Container by mouth daily as needed for mild constipation or moderate constipation.   Marland Kitchen spironolactone (ALDACTONE) 25 MG tablet Take 0.5 tablets (12.5 mg total) by mouth daily.  . tamsulosin (FLOMAX) 0.4 MG CAPS capsule TAKE 2 CAPSULES EVERY DAY  . timolol (TIMOPTIC) 0.5 % ophthalmic solution Place 1 drop into both eyes 2 (two) times daily.     Review of Systems   Review of Systems  Constitutional: Negative for chills, fever and malaise/fatigue.  Cardiovascular: Negative for chest pain, dyspnea on exertion, leg swelling, near-syncope, orthopnea, palpitations and syncope.  Respiratory: Negative for cough, shortness of breath and wheezing.   Gastrointestinal: Negative for nausea and vomiting.  Neurological: Negative for dizziness, light-headedness and weakness.   All other systems reviewed and are otherwise negative except as  noted above.  Physical Exam   VS:  BP (!) 150/80 (BP Location: Left Arm, Patient Position: Sitting, Cuff Size: Normal)   Pulse (!) 48   Ht 5\' 11"  (1.803 m)   Wt 174 lb (78.9 kg)   SpO2 91%   BMI 24.27 kg/m  , BMI Body mass index is 24.27 kg/m. GEN: Well nourished, well developed, in no acute distress. HEENT: normal. Neck: Supple, no JVD, carotid bruits, or masses. Cardiac: RRR, gr 2/6 systolic murmur, no  rubs, or gallops. No clubbing, cyanosis, edema.  Radials/DP/PT 2+ and equal bilaterally.  Respiratory:  Respirations regular and unlabored, clear to auscultation bilaterally. GI: Soft, nontender, nondistended, BS + x 4. MS: No deformity or atrophy. Skin: Warm and dry, no rash. Neuro:  Strength and sensation are intact. Psych: Normal affect.  Assessment & Plan    1. Lightheadedness/orthostatic hypotension/syncope -Reports no recurrent syncope  nor near syncope.  Reports intermittent lightheadedness that is exacerbated by position changes has not recurred since his last clinic visit 1 month ago.  Reviewed orthostatic precautions. Bradycardic on exam today, but tells me this is a longstanding history and asymptomatic.  No indication for further evaluation at this time.  2. CAD -No anginal symptoms.  Right and left heart cath 11/28/2019 with severe single-vessel CAD with 90% ostial stenosis involving small ramus intermedius branch that fills with right to left collaterals otherwise mild to moderate nonobstructive CAD in LAD, LCx, RCA. R radial cath site healing appropriately. Recommended for medical management. Continue aspirin, statin, beta blocker.   3. Aortic stenosis -Moderate by TEE 10/2019.  Mild to moderate by echo 11/28/2019.  Repeat echo 01/12/2020 with no evidence of aortic stenosis.  Educated on signs and symptoms of worsening aortic stenosis.  Continue optimal BP control.  4. HFrEF - Euvolemic and well compensated on exam.  EF has normalized to 60 to 65%.  GDMT includes Coreg,  Losartan, Spironolactone. No indication for loop diuretic at this time.  Continue low-sodium, heartily diet.  Continue regular cardiovascular exercise.  NYHA I.  5. HTN - BP well controlled at recent clinic visits at home.  Mildly elevated on initial check today but did improve on repeat check without intervention.. Continue current antihypertensive regimen.   6. Bradycardia -longstanding history of bradycardia dating back a number of years.  He is on Coreg 6.25 mg twice daily.  As his bradycardia is asymptomatic with no near syncope, syncope, weakness, fatigue we will plan to continue Coreg at present dosing.  Would have low threshold to reduce dose.  7. HLD, LDL goal <70 -11/2018 LDL 77.  Continue atorvastatin 20 mg daily.  Plan for lipid panel with next lab collection.  8. DM2 -follows the primary care provider.  Recently started on Jardiance.  Appreciate inclusion of SGLT2 I for cardioprotective benefit.  Disposition: Follow up in 4 month(s) with Dr. Rockey Situ or APP.   Loel Dubonnet, NP 02/15/2020, 3:13 PM

## 2020-02-19 ENCOUNTER — Other Ambulatory Visit: Payer: Self-pay | Admitting: Family

## 2020-02-19 ENCOUNTER — Other Ambulatory Visit: Payer: Self-pay | Admitting: Family Medicine

## 2020-02-22 ENCOUNTER — Other Ambulatory Visit: Payer: Self-pay | Admitting: Family Medicine

## 2020-02-22 NOTE — Telephone Encounter (Signed)
Last office visit 01/18/2020 for chronic back pain.  Last refilled 01/29/2020 for #60 with no refills.  Next Appt: 03/20/2020 for DM.

## 2020-02-25 ENCOUNTER — Ambulatory Visit: Payer: Medicare PPO | Admitting: Cardiovascular Disease

## 2020-02-27 ENCOUNTER — Encounter: Payer: Self-pay | Admitting: Physical Therapy

## 2020-02-27 ENCOUNTER — Other Ambulatory Visit: Payer: Self-pay

## 2020-02-27 ENCOUNTER — Ambulatory Visit: Payer: Medicare PPO | Attending: Family Medicine | Admitting: Physical Therapy

## 2020-02-27 DIAGNOSIS — R2689 Other abnormalities of gait and mobility: Secondary | ICD-10-CM | POA: Insufficient documentation

## 2020-02-27 DIAGNOSIS — G8929 Other chronic pain: Secondary | ICD-10-CM | POA: Diagnosis not present

## 2020-02-27 DIAGNOSIS — M25561 Pain in right knee: Secondary | ICD-10-CM

## 2020-02-27 DIAGNOSIS — M545 Low back pain: Secondary | ICD-10-CM | POA: Insufficient documentation

## 2020-02-27 NOTE — Therapy (Signed)
[facility] [address] [place], [address] Phone: [phone]   Fax:  [phone]  Physical Therapy Treatment  Patient Details  Name: Dustin Harrison MRN: [id number] Date of Birth: 01-Jan-1932 No data recorded  Encounter Date: 02/27/2020   PT End of Session - 02/27/20 1147    Visit Number 1    Number of Visits 17    Date for PT Re-Evaluation 04/25/20    PT Start Time 6834    PT Stop Time 1140    PT Time Calculation (min) 60 min    Activity Tolerance Patient tolerated treatment well    Behavior During Therapy [facility] for tasks assessed/performed           Past Medical History:  Diagnosis Date  . Abnormal glucose   . Arthritis   . Baker's cyst of knee    left  . BPH (benign prostatic hypertrophy)   . Cough    because of "tight" esophagus  . Diabetes mellitus   . Glaucoma   . HOH (hard of hearing)   . Hypertension   . Pheochromocytoma of right adrenal gland   . Ulcer   . Wears hearing aid    bilateral    Past Surgical History:  Procedure Laterality Date  . adrenaletomy    . CARDIAC CATHETERIZATION    . CATARACT EXTRACTION W/PHACO Left 10/08/2015   Procedure: CATARACT EXTRACTION PHACO AND INTRAOCULAR LENS PLACEMENT ([place]) left eye;  Surgeon: Leandrew Koyanagi, MD;  Location: [facility];  Service: Ophthalmology;  Laterality: Left;  MALYUGIN  . HERNIA REPAIR    . RIGHT/LEFT HEART CATH AND CORONARY ANGIOGRAPHY Left 11/28/2019   Procedure: RIGHT/LEFT HEART CATH AND CORONARY ANGIOGRAPHY;  Surgeon: Nelva Bush, MD;  Location: [place] [facility];  Service: Cardiovascular;  Laterality: Left;  . SQUAMOUS CELL CARCINOMA EXCISION  03-2006   left ear  . TEE WITHOUT CARDIOVERSION N/A 10/12/2019   Procedure: TRANSESOPHAGEAL ECHOCARDIOGRAM (TEE);  Surgeon: Minna Merritts, MD;  Location: [facility];  Service: Cardiovascular;  Laterality: N/A;  . TONSILLECTOMY    . XI ROBOTIC LAPAROSCOPIC  ASSISTED APPENDECTOMY N/A 10/11/2019   Procedure: XI ROBOTIC ASSISTED LAPAROSCOPIC INSERTION GASTROSTOMY TUBE;  Surgeon: Jules Husbands, MD;  Location: [facility];  Service: General;  Laterality: N/A;    There were no vitals filed for this visit.   Subjective Assessment - 02/27/20 1042    Pertinent History Pt is a 86 year old male presenting with R sided LBP >30years with R sided knee pain. Reports he used to have sciatica symptoms, does not experience them currently; denies pins/needles, and electrical sesnations. Has increased R sided LBP and R knee pain following stroke May 2020, reports no remaining motor or sensation deficits, and loss of L vocal cords. Reports worst knee pain is 4/10 LBP 3/10; best 0/10 for each. Reports pain in each is a dull ache and they come on seperately. Sleeping on R side, STS transfer, walking aggravates knee pain, reports some sensations that he feels like his knee is going to give way. LBP is aggravated by bending forward and sleeping on his back, he completes "posterior pelvic tilts" regularly which he reports helps his knee pain. Endorses 1 fall/month d/t dizziness, PCP is following him for this. Patient reports he would like to be able to walk more, limited by pain in R knee and back. Pt drives, completes own shopping by leaning on cart, lives with wife, complets ADLs ind. Pt  denies N/V, B&B changes, saddle paresthesia, fever, night sweats, or unrelenting night pain at this time. Pt reports he has lost a lot of weight since stroke, but he is being following for this. Patient is very [place] and has cochlear implant    Limitations Walking;House hold activities;Lifting;Standing    How long can you sit comfortably? unlimited    How long can you stand comfortably? limited by dizziness only    How long can you walk comfortably? 32mins    Patient Stated Goals walk more without pain    Currently in Pain? Yes    Pain Score 2     Pain Location Knee    Pain Orientation Right    Pain  Descriptors / Indicators Aching;Dull    Pain Type Chronic pain    Pain Radiating Towards none    Pain Onset More than a month ago    Pain Frequency Intermittent    Aggravating Factors  Sleeping on R side, walking, squatting    Pain Relieving Factors rest    Effect of Pain on Daily Activities unable to complete walking in community wihtout cart    Multiple Pain Sites Yes    Pain Score 1    Pain Location Back    Pain Orientation Right;Lower    Pain Descriptors / Indicators Aching;Dull    Pain Type Chronic pain    Pain Radiating Towards none    Pain Onset More than a month ago    Pain Frequency Intermittent    Aggravating Factors  sleeping on R side, bending forward, lifting, sqautting    Pain Relieving Factors rest    Effect of Pain on Daily Activities household chores             OBJECTIVE  Mental Status Patient is oriented to person, place and time.  Recent memory is intact.  Remote memory is intact.  Attention span and concentration are intact.  Expressive speech is intact.  Patient's fund of knowledge is within normal limits for educational level.  SENSATION: Grossly intact to light touch bilateral LEs as determined by testing dermatomes L2-S2 Proprioception and hot/cold testing deferred on this date   MUSCULOSKELETAL: Tremor: None Bulk: Normal Tone: Normal No visible step-off along spinal column  Posture Lumbar lordosis: increased Iliac crest height: equal bilaterally Lumbar lateral shift: negative Lower crossed syndrome (tight hip flexors and erector spinae; weak gluts and abs): positive Forward head rounded shoulders  Gait Decreased hip ext bilat, decreased trunk rotation, forward lean/bilat hip flex throughout gait   Palpation  Reports when pain comes on it is at level of patellar tendon, but that palpation to this are "feels good"; no pain to patellar mobilizations TTP with concordant back pain sign to superior glute musculature; none to lumbar  paraspinals/QL   Strength (out of 5) R/L 5/5 Hip flexion 5/5 Hip ER 5/5 Hip IR 5/4+ Hip abduction 3/4+ Hip adduction 3+/3+ Hip extension 5/5 Knee extension 5/5 Knee flexion 5/5 Ankle dorsiflexion 5/5 Ankle plantarflexion 5 Trunk flexion 5 Trunk extension 5/5 Trunk rotation  *Indicates pain   AROM (degrees) R/L (all movements include overpressure unless otherwise stated) All lumbar/hip/knee motions WNL bilat *Indicates pain   PROM (degrees) PROM = AROM  Repeated Movements No centralization or peripheralization of symptoms with repeated lumbar extension or flexion.    Muscle Length Hamstrings: WNL [place]: shortened bilat  Thomas: shortened bilat Ober: WNL    Passive Accessory Intervertebral Motion (PAIVM) Pt denies reproduction of back pain with CPA L1-L5 and UPA  bilaterally L1-L5. Generally hypomobile throughout  Passive Physiological Intervertebral Motion (PPIVM) Normal flexion and extension with PPIVM testing   SPECIAL TESTS Lumbar Radiculopathy and Discogenic: Centralization and Peripheralization (SN 92, -LR 0.12): Negative bilat Slump (SN 83, -LR 0.32): Negative bilat SLR (SN 92, -LR 0.29): Negative  Crossed SLR (SP 90): Negative bilat  Facet Joint: Extension-Rotation (SN 100, -LR 0.0): Negative bilat  Lumbar Spinal Stenosis: Lumbar quadrant (SN 70): Negative bilat  Hip: FABER (SN 81): Positive bilat FADIR (SN 94): Positive bilat Hip scour (SN 50): Negative bilat  SIJ:  Thigh Thrust (SN 88, -LR 0.18) :Negative bilat  Piriformis Syndrome: FAIR Test (SN 88, SP 83): Negative bilat  Coordination/Cerebellar Finger to Nose: WNL Heel to Shin: WNL Rapid alternating movements: WNL Finger Opposition: WNL Pronator Drift: Negative   FUNCTIONAL OUTCOME MEASURES   Results Comments  BERG 40/56 Fall risk, in need of intervention  TUG 10.3sec   5TSTS 19seconds Definite use of UEs needed  10 Meter Gait Speed Self-selected: s = 0.43m/s ; Fastest: s =  1.50m/s Below normative values for full community ambulation    POSTURAL CONTROL TESTS   Modified Clinical Test of Sensory Interaction for Balance    (CTSIB):  CONDITION TIME STRATEGY SWAY  Eyes open, firm surface 30 seconds ankle none  Eyes closed, firm surface 30 seconds ankle none  Eyes open, foam surface 30 seconds ankle min  Eyes closed, foam surface 30 seconds ankle mod    Functional Tasks Squatting: bilat knee valgus,heavy forward trunk lean Sit to stand: Stand: definite use of UE with heavy forward lean; sit: use of back of BLEs at chair and hands to control descent Supine <> sit and rolling modI increased time needed  Ther-Ex PT reviewed the following HEP with patient with patient able to demonstrate a set of the following with min cuing for correction needed. PT educated patient on parameters of therex (how/when to inc/decrease intensity, frequency, rep/set range, stretch hold time, and purpose of therex) with verbalized understanding.  Access Code: [email or web address] Supine Bridge - 1 x daily - 7 x weekly - 3 sets - 10 reps Mini Squat with Counter Support - 1 x daily - 7 x weekly - 3 sets - 10 reps Seated Figure 4 Piriformis Stretch - 3 x daily - 7 x weekly - 30sec hold                                 PT Education - 02/27/20 1056    Education Details Patient was educated on diagnosis, anatomy and pathology involved, prognosis, role of PT, and was given an HEP, demonstrating exercise with proper form following verbal and tactile cues, and was given a paper hand out to continue exercise at home. Pt was educated on and agreed to plan of care.    Person(s) Educated Patient    Methods Explanation;Demonstration;Tactile cues;Verbal cues;Handout    Comprehension Verbalized understanding;Verbal cues required;Returned demonstration;Tactile cues required            PT Short Term Goals - 02/27/20 1151      PT SHORT TERM GOAL #1   Title Pt will be independent  with HEP in order to improve strength and decrease back pain in order to improve pain-free function at home and work.    Baseline 02/27/20 HEP given    Time 4    Period Weeks    Status New  PT Long Term Goals - 02/27/20 1152      PT LONG TERM GOAL #1   Title Patient will increase FOTO score to 63 to demonstrate predicted increase in functional mobility to complete ADLs    Baseline 02/27/20 48    Time 8    Period Weeks    Status New      PT LONG TERM GOAL #2   Title Pt will improve BERG to at least 45/56 in order to demonstrate clinically decreased fall risk cut off score    Baseline 02/27/20 40/56    Time 8    Period Weeks    Status New      PT LONG TERM GOAL #3   Title Pt will decrease 5TSTS by at least 3 seconds in order to demonstrate clinically significant improvement in LE strength    Baseline 02/27/20 19sec    Time 8    Period Weeks    Status New      PT LONG TERM GOAL #4   Title Pt will increase 10MWT to at least 1.5m/s in order to demonstrate decreased fall risk with normalized community ambulation speed    Baseline 02/27/20 Self-selected: s = 0.86m/s ; Fastest: s = 1.4m/s    Time 8    Period Weeks    Status New      PT LONG TERM GOAL #5   Title Pt will decrease worst back pain as reported on NPRS by at least 2 points in order to demonstrate clinically significant reduction in back pain.    Baseline 02/27/20 5/10    Time 8    Period Weeks    Status New                 Plan - 02/27/20 1155    Clinical Impression Statement Pt is a 86 year old male presenting with chronic R sided LBP and knee pain. Impairments in core and hip strength (hip ext), fascial restrictions, posture abnormalities, balance deficits and pain. Activity limitations in transfers, lifting, bending/squatting, standing, and walking; inhibiting participation in ind and pain free ADLs and community activity. Pt will benefit from skilled PT to address impairments to return patient to  PL[facility].    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/Moderate complexity    Clinical Decision Making Moderate    Rehab Potential Good    PT Frequency 2x / week    PT Duration 8 weeks    PT Treatment/Interventions ADLs/Self Care Home Management;Iontophoresis 4mg /ml Dexamethasone;Stair training;Therapeutic exercise;Passive range of motion;Spinal Manipulations;Joint Manipulations;Dry needling;Manual techniques;Patient/family education;Balance training;Neuromuscular re-education;Therapeutic activities;Functional mobility training;Moist Heat;Electrical Stimulation;Cryotherapy;Gait training;Ultrasound;DME Instruction    PT Next Visit Plan glute strengthening, balance, 6MWT?    PT Home Exercise Plan bridge, mini squat, glute stretch    Consulted and Agree with Plan of Care Patient           Patient will benefit from skilled therapeutic intervention in order to improve the following deficits and impairments:  Abnormal gait, Decreased balance, Decreased endurance, Decreased mobility, Difficulty walking, Cardiopulmonary status limiting activity, Decreased range of motion, Decreased activity tolerance, Decreased coordination, Decreased strength, Increased fascial restricitons, Dizziness, Impaired flexibility, Postural dysfunction, Pain, Improper body mechanics  Visit Diagnosis: Chronic pain of right knee  Chronic right-sided low back pain without sciatica  Other  abnormalities of gait and mobility     Problem List Patient Active Problem List   Diagnosis Date Noted  . Vocal cord paralysis 12/27/2019  . Acute  HFrEF (heart failure with reduced ejection fraction) ([place]) 11/28/2019  . Chronic systolic CHF (congestive heart failure) ([place]) 10/13/2019  . Malnutrition of moderate degree 10/12/2019  . HFrEF (heart failure with reduced ejection fraction) ([place])   . Cardiomyopathy ([place])   . Bacteremia   . AKI (acute kidney injury) ([place]) 10/07/2019  . Back pain 10/07/2019  . Leukocytosis 10/07/2019  . Acute neck pain 05/25/2019  . CKD (chronic kidney disease) stage 2, GFR 60-89 ml/min 04/23/2016  . Hearing loss 04/23/2016  . Counseling regarding end of life decision making 04/15/2015  . Essential hypertension, benign 08/22/2012  . Diabetes mellitus without complication ([place]) 71/16/5790  . HYPERCHOLESTEROLEMIA 05/12/2009  . History of pheochromocytoma 09/07/2007  . GLAUCOMA 09/07/2007  . History of duodenal ulcer 09/07/2007  . BPH (benign prostatic hyperplasia) 09/07/2007  . OSTEOARTHRITIS 09/07/2007   Durwin Reges DPT Durwin Reges 02/27/2020, 12:16 PM  [facility] [address], [place], [address] Phone: [phone]   Fax:  [phone]  Name: Dustin Harrison MRN: [id number] Date of Birth: 1932-05-20

## 2020-03-01 ENCOUNTER — Encounter: Payer: Self-pay | Admitting: Family

## 2020-03-05 ENCOUNTER — Other Ambulatory Visit: Payer: Self-pay | Admitting: Family Medicine

## 2020-03-05 ENCOUNTER — Ambulatory Visit: Payer: Medicare PPO | Admitting: Physical Therapy

## 2020-03-05 NOTE — Telephone Encounter (Signed)
Last office visit 01/18/2020 for chronic right sided low back pain w/sciatica.  Last refilled 02/22/2020 for #60 with no refills.  Patient is requesting 90 day supply.  Ok to send in #180?

## 2020-03-10 LAB — HM DIABETES FOOT EXAM

## 2020-03-10 NOTE — Assessment & Plan Note (Signed)
Due for re-eval. Change to Glucerna instead of Ensure.  Continue  Jardiance at current dose for now.

## 2020-03-10 NOTE — Progress Notes (Signed)
Chief Complaint  Patient presents with  . Follow-up    DM     History of Present Illness: HPI  86 year old male presents for Dm follow up.  Diabetes: At last OV he started on Jardiance in addition to metformin.  He reports  No SE known. Using medications without difficulties: Hypoglycemic episodes: none Hyperglycemic episodes: none Feet problems: none Blood Sugars averaging: 144-160  He is due for repeat A1C.   He reports he has been having issues with difficulty sleeping in last 1-2 months. No trouble falling asleep.  He is waking up with back pain at night.  No new fall  Related to back pain but he falls  Sometimes.   Hx of chronic back issues. MRI  In 2000 bulging disc. 10/2019: Lumbar spondylosis and degenerative disc disease, causing  Failed PT at [facility], tylenol, using aleve 2 at night and one during the day... minimal help at this point  Recent Labs       Lab Results  Component Value Date   HGBA1C 6.8 (H) 10/07/2019      He is losing weight  Not on purpose. Drinking Ensure 2-3 times a day.    Wt Readings from Last 3 Encounters:  01/18/20 172 lb 8 oz (78.2 kg)  01/09/20 177 lb 8 oz (80.5 kg)  12/27/19 175 lb 12 oz (79.7 kg)   ECHO shows improvement in EF 25 to 60% !!  This visit occurred during the SARS-CoV-2 public health emergency. Safety protocols were in place, including screening questions prior to the visit, additional usage of staff PPE, and extensive cleaning of exam room while observing appropriate contact time as indicated for disinfecting solutions.   COVID 19 screen:  No recent travel or known exposure to COVID19 The patient denies respiratory symptoms of COVID 19 at this time. The importance of social distancing was discussed today.     ROS Review of Systems  Constitutional: Negative for chills and fever.  HENT: Negative for congestion and ear pain.   Eyes: Negative for pain and redness.  Respiratory: Negative  for cough and shortness of breath.   Cardiovascular: Negative for chest pain, palpitations and leg swelling.  Gastrointestinal: Negative for abdominal pain, blood in stool, constipation, diarrhea, nausea and vomiting.  Genitourinary: Negative for dysuria.  Musculoskeletal: Positive for back pain. Negative for falls and myalgias.  Skin: Negative for rash.  Neurological: Negative for dizziness.  Psychiatric/Behavioral: Negative for depression. The patient has insomnia. The patient is not nervous/anxious.          Past Medical History:  Diagnosis Date  . Abnormal glucose   . Arthritis   . Baker's cyst of knee    left  . BPH (benign prostatic hypertrophy)   . Cough    because of "tight" esophagus  . Diabetes mellitus   . Glaucoma   . HOH (hard of hearing)   . Hypertension   . Pheochromocytoma of right adrenal gland   . Ulcer   . Wears hearing aid    bilateral    reports that he has never smoked. He has never used smokeless tobacco. He reports current alcohol use of about 1.0 standard drink of alcohol per week. He reports that he does not use drugs.   Current Outpatient Medications:  .  aspirin EC 81 MG tablet, Take 1 tablet (81 mg total) by mouth daily. Swallow whole., Disp: , Rfl:  .  atorvastatin (LIPITOR) 20 MG tablet, TAKE ONE TABLET  EVERY DAY, Disp: 90 tablet, Rfl: 1 .  carvedilol (COREG) 6.25 MG tablet, TAKE 1 TABLET BY MOUTH TWICE DAILY WITH A MEAL, Disp: 180 tablet, Rfl: 1 .  empagliflozin (JARDIANCE) 10 MG TABS tablet, Take 1 tablet (10 mg total) by mouth daily before breakfast., Disp: 30 tablet, Rfl: 11 .  finasteride (PROSCAR) 5 MG tablet, TAKE ONE TABLET EVERY DAY, Disp: 90 tablet, Rfl: 1 .  latanoprost (XALATAN) 0.005 % ophthalmic solution, Place 1 drop into both eyes at bedtime., Disp: , Rfl:  .  lidocaine (LIDODERM) 5 %, Place 1 patch onto the skin at bedtime. Remove & Discard patch within 12 hours or as directed by MD, Disp: , Rfl:  .  losartan  (COZAAR) 100 MG tablet, TAKE ONE TABLET EVERY DAY, Disp: 90 tablet, Rfl: 1 .  metFORMIN (GLUCOPHAGE) 500 MG tablet, Take by mouth 2 (two) times daily with a meal., Disp: , Rfl:  .  naproxen sodium (ALEVE) 220 MG tablet, Take 440 mg by mouth daily. Takes 2 at night and 1 during day, up to 660mg ., Disp: , Rfl:  .  pantoprazole (PROTONIX) 20 MG tablet, Take 1 tablet (20 mg total) by mouth daily., Disp: 30 tablet, Rfl: 6 .  polyethylene glycol powder (MIRALAX) 17 GM/SCOOP powder, Take 1 Container by mouth daily as needed for mild constipation or moderate constipation. , Disp: , Rfl:  .  spironolactone (ALDACTONE) 25 MG tablet, Take 0.5 tablets (12.5 mg total) by mouth daily., Disp: 45 tablet, Rfl: 1 .  tamsulosin (FLOMAX) 0.4 MG CAPS capsule, TAKE 2 CAPSULES EVERY DAY, Disp: 180 capsule, Rfl: 3 .  timolol (TIMOPTIC) 0.5 % ophthalmic solution, Place 1 drop into both eyes 2 (two) times daily. , Disp: , Rfl:    Observations/Objective: Blood pressure 124/60, pulse (!) 50, temperature (!) 97.4 F (36.3 C), temperature source Temporal, height 5\' 11"  (1.803 m), weight 172 lb 8 oz (78.2 kg), SpO2 99 %.  Physical Exam  GEN: nad, alert and oriented HEENT: mucous membranes moist NECK: supple w/o LA CV: rrr.distant heart sounds no murmur PULM: ctab, no inc wob ABD: soft, +bs EXT: no edema SKIN: no acute rash Psych: interactive, hard of hearing, calm directed speech, appropriate affect  Assessment and Plan  HFrEF (heart failure with reduced ejection fraction) (HCC) Improved with current treatment. No current fluid overload.  Diabetes mellitus without complication ([place]) Due for re-eval. Change to Glucerna instead of Ensure.  Continue  Jardiance at current dose for now.  Chronic back pain Refer to PM and R for pain control/additional managment. Continue PT. Stop aleve and change to trial of meloxicam.     Eliezer Lofts, MD

## 2020-03-10 NOTE — Assessment & Plan Note (Signed)
Improved with current treatment. No current fluid overload.

## 2020-03-10 NOTE — Assessment & Plan Note (Signed)
Refer to PM and R for pain control/additional managment. Continue PT. Stop aleve and change to trial of meloxicam.

## 2020-03-11 ENCOUNTER — Ambulatory Visit: Payer: Medicare PPO | Attending: Family Medicine | Admitting: Physical Therapy

## 2020-03-11 ENCOUNTER — Other Ambulatory Visit: Payer: Self-pay

## 2020-03-11 ENCOUNTER — Encounter: Payer: Self-pay | Admitting: Physical Therapy

## 2020-03-11 DIAGNOSIS — R2689 Other abnormalities of gait and mobility: Secondary | ICD-10-CM | POA: Diagnosis not present

## 2020-03-11 DIAGNOSIS — M545 Low back pain, unspecified: Secondary | ICD-10-CM | POA: Diagnosis not present

## 2020-03-11 DIAGNOSIS — G8929 Other chronic pain: Secondary | ICD-10-CM | POA: Insufficient documentation

## 2020-03-11 DIAGNOSIS — M25561 Pain in right knee: Secondary | ICD-10-CM | POA: Insufficient documentation

## 2020-03-11 NOTE — Therapy (Signed)
[facility] [address], [place], [address] Phone: [phone]   Fax:  [phone]  Physical Therapy Treatment  Patient Details  Name: Dustin Harrison MRN: [id number] Date of Birth: [birth year] No data recorded  Encounter Date: 03/11/2020   PT End of Session - 03/11/20 1019    Visit Number 2    Number of Visits 17    Date for PT Re-Evaluation 04/25/20    Authorization - Visit Number 2    Authorization - Number of Visits 10    PT Start Time 0950    PT Stop Time 1028    PT Time Calculation (min) 38 min    Activity Tolerance Patient tolerated treatment well    Behavior During Therapy [facility] for tasks assessed/performed           Past Medical History:  Diagnosis Date  . Abnormal glucose   . Arthritis   . Baker's cyst of knee    left  . BPH (benign prostatic hypertrophy)   . Cough    because of "tight" esophagus  . Diabetes mellitus   . Glaucoma   . HOH (hard of hearing)   . Hypertension   . Pheochromocytoma of right adrenal gland   . Ulcer   . Wears hearing aid    bilateral    Past Surgical History:  Procedure Laterality Date  . adrenaletomy    . CARDIAC CATHETERIZATION    . CATARACT EXTRACTION W/PHACO Left 10/08/2015   Procedure: CATARACT EXTRACTION PHACO AND INTRAOCULAR LENS PLACEMENT ([place]) left eye;  Surgeon: Leandrew Koyanagi, MD;  Location: [place];  Service: Ophthalmology;  Laterality: Left;  MALYUGIN  . HERNIA REPAIR    . RIGHT/LEFT HEART CATH AND CORONARY ANGIOGRAPHY Left 11/28/2019   Procedure: RIGHT/LEFT HEART CATH AND CORONARY ANGIOGRAPHY;  Surgeon: Nelva Bush, MD;  Location: [place] [facility];  Service: Cardiovascular;  Laterality: Left;  . SQUAMOUS CELL CARCINOMA EXCISION  03-2006   left ear  . TEE WITHOUT CARDIOVERSION N/A 10/12/2019   Procedure: TRANSESOPHAGEAL ECHOCARDIOGRAM (TEE);  Surgeon: Minna Merritts, MD;  Location: [facility];  Service:  Cardiovascular;  Laterality: N/A;  . TONSILLECTOMY    . XI ROBOTIC LAPAROSCOPIC ASSISTED APPENDECTOMY N/A 10/11/2019   Procedure: XI ROBOTIC ASSISTED LAPAROSCOPIC INSERTION GASTROSTOMY TUBE;  Surgeon: Jules Husbands, MD;  Location: [facility];  Service: General;  Laterality: N/A;    There were no vitals filed for this visit.   Subjective Assessment - 03/11/20 0956    Subjective Patient reports he is having no back pain today, but some R knee pain 3/10. Reports he has been doing his HEP, but would like to make sure he is doing them "correctly".    Pertinent History Pt is a 86 year old male presenting with R sided LBP >30years with R sided knee pain. Reports he used to have sciatica symptoms, does not experience them currently; denies pins/needles, and electrical sesnations. Has increased R sided LBP and R knee pain following stroke May 2020, reports no remaining motor or sensation deficits, and loss of L vocal cords. Reports worst knee pain is 4/10 LBP 3/10; best 0/10 for each. Reports pain in each is a dull ache and they come on seperately. Sleeping on R side, STS transfer, walking aggravates knee pain, reports some sensations that he feels like his knee is going to give way. LBP is aggravated by bending forward and sleeping on his back, he completes "posterior pelvic tilts"  regularly which he reports helps his knee pain. Endorses 1 fall/month d/t dizziness, PCP is following him for this. Patient reports he would like to be able to walk more, limited by pain in R knee and back. Pt drives, completes own shopping by leaning on cart, lives with wife, complets ADLs ind. Pt denies N/V, B&B changes, saddle paresthesia, fever, night sweats, or unrelenting night pain at this time. Pt reports he has lost a lot of weight since stroke, but he is being following for this. Patient is very [place] and has cochlear implant    Limitations Walking;House hold activities;Lifting;Standing    How long can you sit comfortably?  unlimited    How long can you stand comfortably? limited by dizziness only    How long can you walk comfortably? 45mins    Patient Stated Goals walk more without pain    Pain Onset More than a month ago    Pain Onset More than a month ago           Ther-Ex Nustep L5 58mins seat  Seated Figure 4 Piriformis Stretch 30sec bilat Supine Bridge 2x 10 with difficulty with full hip ext without lumbar ext compensation Modified thomas stretch 44min bilat with cuing to maintain spine contact to mat table Mini Squat with Counter Support x12 with demo and multimodal cuing needed for proper technique with good carry over Prone alt hip ext 2x 10; alt supermans x10 with difficulty with RUE lift, cuing for glute activation with excellent carry over Prone press/cobra stretch x15 3sec hold with min TC to complete with proper technique Sidelying hip abd RTB 2x 10 bilat with cuing initially for proper technique with good carry over Backward walking                         PT Education - 03/11/20 1006    Education Details therex form/technique    Person(s) Educated Patient    Methods Explanation;Demonstration;Verbal cues    Comprehension Verbalized understanding;Returned demonstration;Verbal cues required            PT Short Term Goals - 02/27/20 1151      PT SHORT TERM GOAL #1   Title Pt will be independent with HEP in order to improve strength and decrease back pain in order to improve pain-free function at home and work.    Baseline 02/27/20 HEP given    Time 4    Period Weeks    Status New             PT Long Term Goals - 02/27/20 1152      PT LONG TERM GOAL #1   Title Patient will increase FOTO score to 63 to demonstrate predicted increase in functional mobility to complete ADLs    Baseline 02/27/20 48    Time 8    Period Weeks    Status New      PT LONG TERM GOAL #2   Title Pt will improve BERG to at least 45/56 in order to demonstrate clinically decreased fall  risk cut off score    Baseline 02/27/20 40/56    Time 8    Period Weeks    Status New      PT LONG TERM GOAL #3   Title Pt will decrease 5TSTS by at least 3 seconds in order to demonstrate clinically significant improvement in LE strength    Baseline 02/27/20 19sec    Time 8    Period Weeks  Status New      PT LONG TERM GOAL #4   Title Pt will increase 10MWT to at least 1.3m/s in order to demonstrate decreased fall risk with normalized community ambulation speed    Baseline 02/27/20 Self-selected: s = 0.18m/s ; Fastest: s = 1.45m/s    Time 8    Period Weeks    Status New      PT LONG TERM GOAL #5   Title Pt will decrease worst back pain as reported on NPRS by at least 2 points in order to demonstrate clinically significant reduction in back pain.    Baseline 02/27/20 5/10    Time 8    Period Weeks    Status New                 Plan - 03/11/20 1021    Clinical Impression Statement PT continued therex progression for function hip strength and incresaed extensor mobility with success. Patient is able to demonstrate good carry over of all cuing for proper technique of therex, and postural correction. PT will continue progression as able.    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/Moderate complexity    Clinical Decision Making Moderate    Rehab Potential Good    PT Frequency 2x / week    PT Duration 8 weeks    PT Treatment/Interventions ADLs/Self Care Home Management;Iontophoresis 4mg /ml Dexamethasone;Stair training;Therapeutic exercise;Passive range of motion;Spinal Manipulations;Joint  Manipulations;Dry needling;Manual techniques;Patient/family education;Balance training;Neuromuscular re-education;Therapeutic activities;Functional mobility training;Moist Heat;Electrical Stimulation;Cryotherapy;Gait training;Ultrasound;DME Instruction    PT Next Visit Plan glute strengthening, gait strengthening,  balance, 6MWT?    PT Home Exercise Plan bridge, mini squat, glute stretch    Consulted and Agree with Plan of Care Patient           Patient will benefit from skilled therapeutic intervention in order to improve the following deficits and impairments:  Abnormal gait, Decreased balance, Decreased endurance, Decreased mobility, Difficulty walking, Cardiopulmonary status limiting activity, Decreased range of motion, Decreased activity tolerance, Decreased coordination, Decreased strength, Increased fascial restricitons, Dizziness, Impaired flexibility, Postural dysfunction, Pain, Improper body mechanics  Visit Diagnosis: Chronic pain of right knee  Chronic right-sided low back pain without sciatica  Other abnormalities of gait and mobility     Problem List Patient Active Problem List   Diagnosis Date Noted  . Vocal cord paralysis 12/27/2019  . Acute HFrEF (heart failure with reduced ejection fraction) ([place]) 11/28/2019  . Chronic systolic CHF (congestive heart failure) ([place]) 10/13/2019  . Malnutrition of moderate degree 10/12/2019  . HFrEF (heart failure with reduced ejection fraction) ([place])   . Cardiomyopathy ([place])   . Bacteremia   . AKI (acute kidney injury) ([place]) 10/07/2019  . Chronic back pain 10/07/2019  . Leukocytosis 10/07/2019  . Acute neck pain 05/25/2019  . CKD (chronic kidney disease) stage 2, GFR 60-89 ml/min 04/23/2016  . Hearing loss 04/23/2016  . Counseling regarding end of life decision making 04/15/2015  . Essential hypertension, benign 08/22/2012  . Diabetes mellitus without complication ([place]) 50/56/9794  . HYPERCHOLESTEROLEMIA 05/12/2009  . History  of pheochromocytoma 09/07/2007  . GLAUCOMA 09/07/2007  . History of duodenal ulcer 09/07/2007  . BPH (benign prostatic hyperplasia) 09/07/2007  . OSTEOARTHRITIS 09/07/2007   Durwin Reges DPT Durwin Reges 03/11/2020, 2:38 PM  [place]  [place] [facility] [address] [place], [address] Phone: [phone]   Fax:  [phone]  Name: Dustin Harrison MRN: [id number] Date of Birth: Feb 23, 1932

## 2020-03-12 ENCOUNTER — Telehealth: Payer: Self-pay | Admitting: Family Medicine

## 2020-03-12 DIAGNOSIS — E119 Type 2 diabetes mellitus without complications: Secondary | ICD-10-CM

## 2020-03-12 DIAGNOSIS — D72829 Elevated white blood cell count, unspecified: Secondary | ICD-10-CM

## 2020-03-12 NOTE — Telephone Encounter (Signed)
-----   Message from Cloyd Stagers, RT sent at 02/27/2020  1:07 PM EDT ----- Regarding: Lab Orders for Thursday 10.7.2021 Please place lab orders for Thursday 10.7.2021, office visit for f/u on Thursday 10.14.2021 Thank you, Dyke Maes RT(R)

## 2020-03-13 ENCOUNTER — Other Ambulatory Visit: Payer: Self-pay

## 2020-03-13 ENCOUNTER — Ambulatory Visit: Payer: Medicare PPO | Admitting: Physical Therapy

## 2020-03-13 ENCOUNTER — Encounter: Payer: Self-pay | Admitting: Physical Therapy

## 2020-03-13 ENCOUNTER — Other Ambulatory Visit (INDEPENDENT_AMBULATORY_CARE_PROVIDER_SITE_OTHER): Payer: Medicare PPO

## 2020-03-13 DIAGNOSIS — M25561 Pain in right knee: Secondary | ICD-10-CM | POA: Diagnosis not present

## 2020-03-13 DIAGNOSIS — G8929 Other chronic pain: Secondary | ICD-10-CM | POA: Diagnosis not present

## 2020-03-13 DIAGNOSIS — R2689 Other abnormalities of gait and mobility: Secondary | ICD-10-CM

## 2020-03-13 DIAGNOSIS — D72829 Elevated white blood cell count, unspecified: Secondary | ICD-10-CM | POA: Diagnosis not present

## 2020-03-13 DIAGNOSIS — M545 Low back pain, unspecified: Secondary | ICD-10-CM | POA: Diagnosis not present

## 2020-03-13 DIAGNOSIS — E119 Type 2 diabetes mellitus without complications: Secondary | ICD-10-CM | POA: Diagnosis not present

## 2020-03-13 LAB — COMPREHENSIVE METABOLIC PANEL
ALT: 20 U/L (ref 0–53)
AST: 21 U/L (ref 0–37)
Albumin: 4.4 g/dL (ref 3.5–5.2)
Alkaline Phosphatase: 63 U/L (ref 39–117)
BUN: 51 mg/dL — ABNORMAL HIGH (ref 6–23)
CO2: 28 mEq/L (ref 19–32)
Calcium: 9.7 mg/dL (ref 8.4–10.5)
Chloride: 102 mEq/L (ref 96–112)
Creatinine, Ser: 1.64 mg/dL — ABNORMAL HIGH (ref 0.40–1.50)
GFR: 37.26 mL/min — ABNORMAL LOW (ref >60.00)
Glucose, Bld: 135 mg/dL — ABNORMAL HIGH (ref 70–99)
Potassium: 5.1 mEq/L (ref 3.5–5.1)
Sodium: 136 mEq/L (ref 135–145)
Total Bilirubin: 0.6 mg/dL (ref 0.2–1.2)
Total Protein: 7.6 g/dL (ref 6.0–8.3)

## 2020-03-13 LAB — CBC WITH DIFFERENTIAL/PLATELET
Basophils Absolute: 0 10*3/uL (ref 0.0–0.1)
Basophils Relative: 0.5 % (ref 0.0–3.0)
Eosinophils Absolute: 0.2 10*3/uL (ref 0.0–0.7)
Eosinophils Relative: 2.5 % (ref 0.0–5.0)
HCT: 41.2 % (ref 39.0–52.0)
Hemoglobin: 13.2 g/dL (ref 13.0–17.0)
Lymphocytes Relative: 26 % (ref 12.0–46.0)
Lymphs Abs: 1.7 10*3/uL (ref 0.7–4.0)
MCHC: 32.1 g/dL (ref 30.0–36.0)
MCV: 91.9 fl (ref 78.0–100.0)
Monocytes Absolute: 0.7 10*3/uL (ref 0.1–1.0)
Monocytes Relative: 11.1 % (ref 3.0–12.0)
Neutro Abs: 4 10*3/uL (ref 1.4–7.7)
Neutrophils Relative %: 59.9 % (ref 43.0–77.0)
Platelets: 274 10*3/uL (ref 150.0–400.0)
RBC: 4.48 Mil/uL (ref 4.22–5.81)
RDW: 15.6 % — ABNORMAL HIGH (ref 11.5–15.5)
WBC: 6.7 10*3/uL (ref 4.0–10.5)

## 2020-03-13 LAB — HEMOGLOBIN A1C: Hgb A1c MFr Bld: 7.4 % — ABNORMAL HIGH (ref 4.6–6.5)

## 2020-03-13 NOTE — Therapy (Signed)
[place] [facility] [address], [place], [address] Phone: [phone]   Fax:  [phone]  Physical Therapy Treatment  Patient Details  Name: Dustin Harrison MRN: [id number] Date of Birth: 09-Jul-1931 No data recorded  Encounter Date: 03/13/2020   PT End of Session - 03/13/20 0956    Visit Number 3    Number of Visits 17    Date for PT Re-Evaluation 04/25/20    Authorization - Visit Number 3    Authorization - Number of Visits 10    PT Start Time 0949    PT Stop Time 1027    PT Time Calculation (min) 38 min    Activity Tolerance Patient tolerated treatment well    Behavior During Therapy [facility] for tasks assessed/performed           Past Medical History:  Diagnosis Date  . Abnormal glucose   . Arthritis   . Baker's cyst of knee    left  . BPH (benign prostatic hypertrophy)   . Cough    because of "tight" esophagus  . Diabetes mellitus   . Glaucoma   . HOH (hard of hearing)   . Hypertension   . Pheochromocytoma of right adrenal gland   . Ulcer   . Wears hearing aid    bilateral    Past Surgical History:  Procedure Laterality Date  . adrenaletomy    . CARDIAC CATHETERIZATION    . CATARACT EXTRACTION W/PHACO Left 10/08/2015   Procedure: CATARACT EXTRACTION PHACO AND INTRAOCULAR LENS PLACEMENT ([place]) left eye;  Surgeon: Leandrew Koyanagi, MD;  Location: [place];  Service: Ophthalmology;  Laterality: Left;  MALYUGIN  . HERNIA REPAIR    . RIGHT/LEFT HEART CATH AND CORONARY ANGIOGRAPHY Left 11/28/2019   Procedure: RIGHT/LEFT HEART CATH AND CORONARY ANGIOGRAPHY;  Surgeon: Nelva Bush, MD;  Location: [place] [facility];  Service: Cardiovascular;  Laterality: Left;  . SQUAMOUS CELL CARCINOMA EXCISION  03-2006   left ear  . TEE WITHOUT CARDIOVERSION N/A 10/12/2019   Procedure: TRANSESOPHAGEAL ECHOCARDIOGRAM (TEE);  Surgeon: Minna Merritts, MD;  Location: [facility];  Service:  Cardiovascular;  Laterality: N/A;  . TONSILLECTOMY    . XI ROBOTIC LAPAROSCOPIC ASSISTED APPENDECTOMY N/A 10/11/2019   Procedure: XI ROBOTIC ASSISTED LAPAROSCOPIC INSERTION GASTROSTOMY TUBE;  Surgeon: Jules Husbands, MD;  Location: [facility];  Service: General;  Laterality: N/A;    There were no vitals filed for this visit.   Subjective Assessment - 03/13/20 0954    Subjective Patient reports he is doing well this am, no LBP or LE pain. Reports he is completing his HEP, but his R knee is a little sore following last session and water aerobics.    Pertinent History Pt is a 86 year old male presenting with R sided LBP >30years with R sided knee pain. Reports he used to have sciatica symptoms, does not experience them currently; denies pins/needles, and electrical sesnations. Has increased R sided LBP and R knee pain following stroke May 2020, reports no remaining motor or sensation deficits, and loss of L vocal cords. Reports worst knee pain is 4/10 LBP 3/10; best 0/10 for each. Reports pain in each is a dull ache and they come on seperately. Sleeping on R side, STS transfer, walking aggravates knee pain, reports some sensations that he feels like his knee is going to give way. LBP is aggravated by bending forward and sleeping on his back, he completes "posterior pelvic tilts"  regularly which he reports helps his knee pain. Endorses 1 fall/month d/t dizziness, PCP is following him for this. Patient reports he would like to be able to walk more, limited by pain in R knee and back. Pt drives, completes own shopping by leaning on cart, lives with wife, complets ADLs ind. Pt denies N/V, B&B changes, saddle paresthesia, fever, night sweats, or unrelenting night pain at this time. Pt reports he has lost a lot of weight since stroke, but he is being following for this. Patient is very [place] and has cochlear implant    Limitations Walking;House hold activities;Lifting;Standing    How long can you sit comfortably?  unlimited    How long can you stand comfortably? limited by dizziness only    How long can you walk comfortably? 74mins    Patient Stated Goals walk more without pain    Pain Onset More than a month ago    Pain Onset More than a month ago           Ther-Ex Nustep L5 39mins seat and UE setting 11, min cuing to maintain SPM over 60 Prone press/cobra stretch x15 3sec hold with min TC to complete with proper technique Prone alt hip ext 3x 12 with min cuing to prevent rotation compensation with decent carry over Supine Bridge 3x 12 with difficulty with full hip ext without lumbar ext compensation Mini Squat to mat table x10 with good carryover following min cuing for upright trunk; with power stand 2x 10  MATRIX hip ext 40# 3x 10 bilat Backward walking 3x 22ft, step to for safety (step through for last trial) with cuing for increased ext CGA for safety; occasional minA to reestablish balance                          PT Education - 03/13/20 0955    Education Details therex form/technique    Person(s) Educated Patient    Methods Demonstration;Explanation;Verbal cues    Comprehension Verbalized understanding;Returned demonstration;Verbal cues required            PT Short Term Goals - 02/27/20 1151      PT SHORT TERM GOAL #1   Title Pt will be independent with HEP in order to improve strength and decrease back pain in order to improve pain-free function at home and work.    Baseline 02/27/20 HEP given    Time 4    Period Weeks    Status New             PT Long Term Goals - 02/27/20 1152      PT LONG TERM GOAL #1   Title Patient will increase FOTO score to 63 to demonstrate predicted increase in functional mobility to complete ADLs    Baseline 02/27/20 48    Time 8    Period Weeks    Status New      PT LONG TERM GOAL #2   Title Pt will improve BERG to at least 45/56 in order to demonstrate clinically decreased fall risk cut off score    Baseline 02/27/20  40/56    Time 8    Period Weeks    Status New      PT LONG TERM GOAL #3   Title Pt will decrease 5TSTS by at least 3 seconds in order to demonstrate clinically significant improvement in LE strength    Baseline 02/27/20 19sec    Time 8    Period Weeks  Status New      PT LONG TERM GOAL #4   Title Pt will increase 10MWT to at least 1.94m/s in order to demonstrate decreased fall risk with normalized community ambulation speed    Baseline 02/27/20 Self-selected: s = 0.55m/s ; Fastest: s = 1.35m/s    Time 8    Period Weeks    Status New      PT LONG TERM GOAL #5   Title Pt will decrease worst back pain as reported on NPRS by at least 2 points in order to demonstrate clinically significant reduction in back pain.    Baseline 02/27/20 5/10    Time 8    Period Weeks    Status New                 Plan - 03/13/20 1013    Clinical Impression Statement PT continued therex progression for increased hip and core strength and extensor mobility with success. Patient is able to comply with all cuing for technique and safety, with good motivation throughout session and no increased pain. PT will continue progression as able.    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/Moderate complexity    Clinical Decision Making Moderate    Rehab Potential Good    PT Frequency 2x / week    PT Duration 8 weeks    PT Treatment/Interventions ADLs/Self Care Home Management;Iontophoresis 4mg /ml Dexamethasone;Stair training;Therapeutic exercise;Passive range of motion;Spinal Manipulations;Joint Manipulations;Dry  needling;Manual techniques;Patient/family education;Balance training;Neuromuscular re-education;Therapeutic activities;Functional mobility training;Moist Heat;Electrical Stimulation;Cryotherapy;Gait training;Ultrasound;DME Instruction    PT Next Visit Plan glute strengthening, gait strengthening,  balance,    PT Home Exercise Plan bridge, mini squat, glute stretch    Consulted and Agree with Plan of Care Patient           Patient will benefit from skilled therapeutic intervention in order to improve the following deficits and impairments:  Abnormal gait, Decreased balance, Decreased endurance, Decreased mobility, Difficulty walking, Cardiopulmonary status limiting activity, Decreased range of motion, Decreased activity tolerance, Decreased coordination, Decreased strength, Increased fascial restricitons, Dizziness, Impaired flexibility, Postural dysfunction, Pain, Improper body mechanics  Visit Diagnosis: Chronic pain of right knee  Chronic right-sided low back pain without sciatica  Other abnormalities of gait and mobility     Problem List Patient Active Problem List   Diagnosis Date Noted  . Vocal cord paralysis 12/27/2019  . Acute HFrEF (heart failure with reduced ejection fraction) ([place]) 11/28/2019  . Chronic systolic CHF (congestive heart failure) ([place]) 10/13/2019  . Malnutrition of moderate degree 10/12/2019  . HFrEF (heart failure with reduced ejection fraction) ([place])   . Cardiomyopathy ([place])   . Bacteremia   . AKI (acute kidney injury) ([place]) 10/07/2019  . Chronic back pain 10/07/2019  . Leukocytosis 10/07/2019  . Acute neck pain 05/25/2019  . CKD (chronic kidney disease) stage 2, GFR 60-89 ml/min 04/23/2016  . Hearing loss 04/23/2016  . Counseling regarding end of life decision making 04/15/2015  . Essential hypertension, benign 08/22/2012  . Diabetes mellitus without complication ([place]) 16/03/9603  . HYPERCHOLESTEROLEMIA 05/12/2009  . History of pheochromocytoma  09/07/2007  . GLAUCOMA 09/07/2007  . History of duodenal ulcer 09/07/2007  . BPH (benign prostatic hyperplasia) 09/07/2007  . OSTEOARTHRITIS 09/07/2007   Durwin Reges DPT Durwin Reges 03/13/2020, 10:22 AM  [place] [address], [place], [address] Phone: [phone]   Fax:  [phone]  Name: Dustin Harrison MRN: [id number] Date of Birth: June 15, 1931

## 2020-03-13 NOTE — Progress Notes (Signed)
No critical labs need to be addressed urgently. We will discuss labs in detail at upcoming office visit.   

## 2020-03-17 MED ORDER — AMLODIPINE BESYLATE 5 MG PO TABS
5.0000 mg | ORAL_TABLET | Freq: Every day | ORAL | 1 refills | Status: DC
Start: 2020-03-17 — End: 2020-05-14

## 2020-03-17 MED ORDER — CARVEDILOL 6.25 MG PO TABS: 3.1250 mg | ORAL_TABLET | Freq: Two times a day (BID) | ORAL | 1 refills | Status: DC

## 2020-03-18 ENCOUNTER — Ambulatory Visit: Payer: Medicare PPO | Admitting: Physical Therapy

## 2020-03-18 ENCOUNTER — Other Ambulatory Visit: Payer: Self-pay

## 2020-03-18 ENCOUNTER — Encounter: Payer: Self-pay | Admitting: Physical Therapy

## 2020-03-18 DIAGNOSIS — M25561 Pain in right knee: Secondary | ICD-10-CM | POA: Diagnosis not present

## 2020-03-18 DIAGNOSIS — R2689 Other abnormalities of gait and mobility: Secondary | ICD-10-CM | POA: Diagnosis not present

## 2020-03-18 DIAGNOSIS — G8929 Other chronic pain: Secondary | ICD-10-CM

## 2020-03-18 DIAGNOSIS — M545 Low back pain, unspecified: Secondary | ICD-10-CM | POA: Diagnosis not present

## 2020-03-18 NOTE — Therapy (Signed)
[facility] [address], [place], [address] Phone: [phone]   Fax:  [phone]  Physical Therapy Treatment  Patient Details  Name: Dustin Harrison MRN: [id number] Date of Birth: 04-Sep-1931 No data recorded  Encounter Date: 03/18/2020   PT End of Session - 03/18/20 0955    Visit Number 4    Number of Visits 17    Date for PT Re-Evaluation 04/25/20    Authorization - Visit Number 4    Authorization - Number of Visits 10    PT Start Time 0949    PT Stop Time 1028    PT Time Calculation (min) 39 min    Activity Tolerance Patient tolerated treatment well    Behavior During Therapy [facility] for tasks assessed/performed           Past Medical History:  Diagnosis Date  . Abnormal glucose   . Arthritis   . Baker's cyst of knee    left  . BPH (benign prostatic hypertrophy)   . Cough    because of "tight" esophagus  . Diabetes mellitus   . Glaucoma   . HOH (hard of hearing)   . Hypertension   . Pheochromocytoma of right adrenal gland   . Ulcer   . Wears hearing aid    bilateral    Past Surgical History:  Procedure Laterality Date  . adrenaletomy    . CARDIAC CATHETERIZATION    . CATARACT EXTRACTION W/PHACO Left 10/08/2015   Procedure: CATARACT EXTRACTION PHACO AND INTRAOCULAR LENS PLACEMENT ([place]) left eye;  Surgeon: Leandrew Koyanagi, MD;  Location: [place];  Service: Ophthalmology;  Laterality: Left;  MALYUGIN  . HERNIA REPAIR    . RIGHT/LEFT HEART CATH AND CORONARY ANGIOGRAPHY Left 11/28/2019   Procedure: RIGHT/LEFT HEART CATH AND CORONARY ANGIOGRAPHY;  Surgeon: Nelva Bush, MD;  Location: [place] [facility];  Service: Cardiovascular;  Laterality: Left;  . SQUAMOUS CELL CARCINOMA EXCISION  03-2006   left ear  . TEE WITHOUT CARDIOVERSION N/A 10/12/2019   Procedure: TRANSESOPHAGEAL ECHOCARDIOGRAM (TEE);  Surgeon: Minna Merritts, MD;  Location: [facility];  Service:  Cardiovascular;  Laterality: N/A;  . TONSILLECTOMY    . XI ROBOTIC LAPAROSCOPIC ASSISTED APPENDECTOMY N/A 10/11/2019   Procedure: XI ROBOTIC ASSISTED LAPAROSCOPIC INSERTION GASTROSTOMY TUBE;  Surgeon: Jules Husbands, MD;  Location: [facility];  Service: General;  Laterality: N/A;    There were no vitals filed for this visit.   Subjective Assessment - 03/18/20 0953    Subjective Reports no pain in LB or R knee, and that he did well over the weekend. Reports the R side still feels a little weak, but he hasn't had any pain over the weekend which he is happy with.    Pertinent History Pt is a 86 year old male presenting with R sided LBP >30years with R sided knee pain. Reports he used to have sciatica symptoms, does not experience them currently; denies pins/needles, and electrical sesnations. Has increased R sided LBP and R knee pain following stroke May 2020, reports no remaining motor or sensation deficits, and loss of L vocal cords. Reports worst knee pain is 4/10 LBP 3/10; best 0/10 for each. Reports pain in each is a dull ache and they come on seperately. Sleeping on R side, STS transfer, walking aggravates knee pain, reports some sensations that he feels like his knee is going to give way. LBP is aggravated by bending forward and sleeping on his  back, he completes "posterior pelvic tilts" regularly which he reports helps his knee pain. Endorses 1 fall/month d/t dizziness, PCP is following him for this. Patient reports he would like to be able to walk more, limited by pain in R knee and back. Pt drives, completes own shopping by leaning on cart, lives with wife, complets ADLs ind. Pt denies N/V, B&B changes, saddle paresthesia, fever, night sweats, or unrelenting night pain at this time. Pt reports he has lost a lot of weight since stroke, but he is being following for this. Patient is very [place] and has cochlear implant    Limitations Walking;House hold activities;Lifting;Standing    How long can you sit  comfortably? unlimited    How long can you stand comfortably? limited by dizziness only    How long can you walk comfortably? 56mins    Patient Stated Goals walk more without pain    Pain Onset More than a month ago    Pain Onset More than a month ago              Ther-Ex Nustep L5 65mins seatand UE setting 11, min cuing to maintain SPM over 70 Mini squat to mat table x10; with 10# DB swing 2x 10 with min cuing for set up and full hip ext with good carry over Sidestepping 61ft 3x L; 3x R RTB with cuing for eccentric control and foot clearance with decent carry over Step FWD <> BWD over 6in hurdle x20 with cuing for "toes forward" to prevent ankle EV/ hip ER compensation for decreased foot clearance unilateral UE support for safety  Standing narrow BOS on foam drawing large alphabet with BUEs cuing for decreased speed, increased reach with good carry over Prone press/cobra stretch x15 3sec hold with min TC to complete with proper technique  Gait Training 119ft with 5# AW on RLE for increased propulsion/foot clearance with success; adding blackTB resistance at waist x285ft; 5# AW on 5% incline 11mph 28min; 10% incline 1.6mph 9mins; supervision throughout, cuing for RLE foot clearance and LLE step length with excellent carry over        PT Education - 03/18/20 0955    Education Details therex form/technique, gait training    Person(s) Educated Patient    Methods Explanation;Demonstration;Verbal cues    Comprehension Verbalized understanding;Returned demonstration;Verbal cues required            PT Short Term Goals - 02/27/20 1151      PT SHORT TERM GOAL #1   Title Pt will be independent with HEP in order to improve strength and decrease back pain in order to improve pain-free function at home and work.    Baseline 02/27/20 HEP given    Time 4    Period Weeks    Status New             PT Long Term Goals - 02/27/20 1152      PT LONG TERM GOAL #1   Title Patient will  increase FOTO score to 63 to demonstrate predicted increase in functional mobility to complete ADLs    Baseline 02/27/20 48    Time 8    Period Weeks    Status New      PT LONG TERM GOAL #2   Title Pt will improve BERG to at least 45/56 in order to demonstrate clinically decreased fall risk cut off score    Baseline 02/27/20 40/56    Time 8    Period Weeks    Status  New      PT LONG TERM GOAL #3   Title Pt will decrease 5TSTS by at least 3 seconds in order to demonstrate clinically significant improvement in LE strength    Baseline 02/27/20 19sec    Time 8    Period Weeks    Status New      PT LONG TERM GOAL #4   Title Pt will increase 10MWT to at least 1.78m/s in order to demonstrate decreased fall risk with normalized community ambulation speed    Baseline 02/27/20 Self-selected: s = 0.79m/s ; Fastest: s = 1.37m/s    Time 8    Period Weeks    Status New      PT LONG TERM GOAL #5   Title Pt will decrease worst back pain as reported on NPRS by at least 2 points in order to demonstrate clinically significant reduction in back pain.    Baseline 02/27/20 5/10    Time 8    Period Weeks    Status New                 Plan - 03/18/20 1026    Clinical Impression Statement PT continued therex progression for increased RLE strength, core strength, and gait training, with good success. Patient is able to comply with all cuing for proper techinque, with gaurding needed for safety. Patient with increased RLE propulsion and foot clearance in gait with resistance. PT will continue progression as able.    Personal Factors and Comorbidities Comorbidity 1;Comorbidity 2;Fitness;Comorbidity 3+;Past/Current Experience;Time since onset of injury/illness/exacerbation;Age    Comorbidities HTN, DM, HOH    Examination-Activity Limitations Stairs;Stand;Bed Mobility;Transfers;Squat;Locomotion Level;Lift    Examination-Participation Restrictions Laundry;Cleaning;Community Activity;Meal Prep;Yard Work     [employer] Evolving/Moderate complexity    Clinical Decision Making Moderate    Rehab Potential Good    PT Frequency 2x / week    PT Duration 8 weeks    PT Treatment/Interventions ADLs/Self Care Home Management;Iontophoresis 4mg /ml Dexamethasone;Stair training;Therapeutic exercise;Passive range of motion;Spinal Manipulations;Joint Manipulations;Dry needling;Manual techniques;Patient/family education;Balance training;Neuromuscular re-education;Therapeutic activities;Functional mobility training;Moist Heat;Electrical Stimulation;Cryotherapy;Gait training;Ultrasound;DME Instruction    PT Next Visit Plan glute strengthening, gait strengthening,  balance,    PT Home Exercise Plan bridge, mini squat, glute stretch    Consulted and Agree with Plan of Care Patient           Patient will benefit from skilled therapeutic intervention in order to improve the following deficits and impairments:  Abnormal gait, Decreased balance, Decreased endurance, Decreased mobility, Difficulty walking, Cardiopulmonary status limiting activity, Decreased range of motion, Decreased activity tolerance, Decreased coordination, Decreased strength, Increased fascial restricitons, Dizziness, Impaired flexibility, Postural dysfunction, Pain, Improper body mechanics  Visit Diagnosis: Chronic pain of right knee  Chronic right-sided low back pain without sciatica  Other abnormalities of gait and mobility     Problem List Patient Active Problem List   Diagnosis Date Noted  . Vocal cord paralysis 12/27/2019  . Acute HFrEF (heart failure with reduced ejection fraction) ([place]) 11/28/2019  . Chronic systolic CHF (congestive heart failure) ([place]) 10/13/2019  . Malnutrition of moderate degree 10/12/2019  . HFrEF (heart failure with reduced ejection fraction) ([place])   . Cardiomyopathy ([place])   . Bacteremia   . AKI (acute kidney injury) ([place]) 10/07/2019  . Chronic back pain 10/07/2019  . Leukocytosis  10/07/2019  . Acute neck pain 05/25/2019  . CKD (chronic kidney disease) stage 2, GFR 60-89 ml/min 04/23/2016  . Hearing loss 04/23/2016  . Counseling regarding end of life  decision making 04/15/2015  . Essential hypertension, benign 08/22/2012  . Diabetes mellitus without complication ([place]) 50/02/3817  . HYPERCHOLESTEROLEMIA 05/12/2009  . History of pheochromocytoma 09/07/2007  . GLAUCOMA 09/07/2007  . History of duodenal ulcer 09/07/2007  . BPH (benign prostatic hyperplasia) 09/07/2007  . OSTEOARTHRITIS 09/07/2007   Durwin Reges DPT Durwin Reges 03/18/2020, 10:27 AM  [facility] [address], [place], [address] Phone: [phone]   Fax:  [phone]  Name: Dustin Harrison MRN: [id number] Date of Birth: [birth year]

## 2020-03-20 ENCOUNTER — Encounter: Payer: Self-pay | Admitting: Family Medicine

## 2020-03-20 ENCOUNTER — Ambulatory Visit: Payer: Medicare PPO | Admitting: Family Medicine

## 2020-03-20 ENCOUNTER — Encounter: Payer: Self-pay | Admitting: Physical Therapy

## 2020-03-20 ENCOUNTER — Ambulatory Visit: Payer: Medicare PPO | Admitting: Physical Therapy

## 2020-03-20 ENCOUNTER — Other Ambulatory Visit: Payer: Self-pay | Admitting: Family Medicine

## 2020-03-20 ENCOUNTER — Other Ambulatory Visit: Payer: Self-pay

## 2020-03-20 VITALS — BP 116/72 | HR 50 | Temp 98.6°F | Ht 71.0 in | Wt 175.0 lb

## 2020-03-20 DIAGNOSIS — M545 Low back pain, unspecified: Secondary | ICD-10-CM | POA: Diagnosis not present

## 2020-03-20 DIAGNOSIS — N1832 Chronic kidney disease, stage 3b: Secondary | ICD-10-CM

## 2020-03-20 DIAGNOSIS — I10 Essential (primary) hypertension: Secondary | ICD-10-CM | POA: Diagnosis not present

## 2020-03-20 DIAGNOSIS — I5020 Unspecified systolic (congestive) heart failure: Secondary | ICD-10-CM

## 2020-03-20 DIAGNOSIS — E1159 Type 2 diabetes mellitus with other circulatory complications: Secondary | ICD-10-CM

## 2020-03-20 DIAGNOSIS — G8929 Other chronic pain: Secondary | ICD-10-CM

## 2020-03-20 DIAGNOSIS — R2689 Other abnormalities of gait and mobility: Secondary | ICD-10-CM | POA: Diagnosis not present

## 2020-03-20 DIAGNOSIS — M25561 Pain in right knee: Secondary | ICD-10-CM

## 2020-03-20 NOTE — Telephone Encounter (Signed)
Last office visit 01/15/2020 for DM, HFrEF, Matnutrition and Low Back Pain with Sciatica right-sided.  Last refilled 03/05/2020 for 180 with no refills.  [facility] is asking for next refill to place on file.

## 2020-03-20 NOTE — Assessment & Plan Note (Signed)
Followed by cardiology 

## 2020-03-20 NOTE — Assessment & Plan Note (Signed)
Stable control. On ARB.

## 2020-03-20 NOTE — Assessment & Plan Note (Signed)
Dizziness resolved on lower dose of coreg.

## 2020-03-20 NOTE — Progress Notes (Signed)
Chief Complaint  Patient presents with  . Diabetes    History of Present Illness: HPI  86 year old male with HFrEF, CAD, HTN, hyperlipidemia presents for DM follow up.  Diabetes:   Slight increase in A1C from last check 6.8 up to 7.4  on Jardiance 10 mg  and Metformin 1 tab BID (max dose given GFR) Lab Results  Component Value Date   HGBA1C 7.4 (H) 03/13/2020  Using medications without difficulties: Hypoglycemic episodes: Hyperglycemic episodes: Feet problems: no ulcers Blood Sugars averaging: not checking eye exam within last year: yes  Wt Readings from Last 3 Encounters:  03/20/20 175 lb (79.4 kg)  02/15/20 174 lb (78.9 kg)  01/18/20 172 lb 8 oz (78.2 kg)    Hypertension:   Good control on  Lower dose coreg. Losartan, spironolactone, clonidine   BP Readings from Last 3 Encounters:  03/20/20 116/72  02/15/20 (!) 150/80  01/18/20 124/60  Using medication without problems or lightheadedness:  Resolved on lower dose of coreg. Chest pain with exertion:none Edema:none Short of breath: stable Average home BPs: Other issues: bradycardia on BBlocker   Going to PT and pool exercsie 3 times a week.  GFR slightly decreased from last check 3 months ago GFR 35  Normal electrolytes. Hg 13.2  This visit occurred during the SARS-CoV-2 public health emergency.  Safety protocols were in place, including screening questions prior to the visit, additional usage of staff PPE, and extensive cleaning of exam room while observing appropriate contact time as indicated for disinfecting solutions.   COVID 19 screen:  No recent travel or known exposure to COVID19 The patient denies respiratory symptoms of COVID 19 at this time. The importance of social distancing was discussed today.     Review of Systems  Constitutional: Negative for chills and fever.  HENT: Negative for congestion and ear pain.   Eyes: Negative for pain and redness.  Respiratory: Negative for cough and shortness of  breath.   Cardiovascular: Negative for chest pain, palpitations and leg swelling.  Gastrointestinal: Negative for abdominal pain, blood in stool, constipation, diarrhea, nausea and vomiting.  Genitourinary: Negative for dysuria.  Musculoskeletal: Negative for falls and myalgias.  Skin: Negative for rash.  Neurological: Negative for dizziness.  Psychiatric/Behavioral: Negative for depression. The patient is not nervous/anxious.       Past Medical History:  Diagnosis Date  . Abnormal glucose   . Arthritis   . Baker's cyst of knee    left  . BPH (benign prostatic hypertrophy)   . Cough    because of "tight" esophagus  . Diabetes mellitus   . Glaucoma   . HOH (hard of hearing)   . Hypertension   . Pheochromocytoma of right adrenal gland   . Ulcer   . Wears hearing aid    bilateral    reports that he has never smoked. He has never used smokeless tobacco. He reports current alcohol use of about 1.0 standard drink of alcohol per week. He reports that he does not use drugs.   Current Outpatient Medications:  .  amLODipine (NORVASC) 5 MG tablet, Take 1 tablet (5 mg total) by mouth daily., Disp: 30 tablet, Rfl: 1 .  atorvastatin (LIPITOR) 20 MG tablet, TAKE ONE TABLET EVERY DAY, Disp: 97 tablet, Rfl: 0 .  carvedilol (COREG) 6.25 MG tablet, Take 0.5 tablets (3.125 mg total) by mouth in the morning and at bedtime., Disp: 90 tablet, Rfl: 1 .  empagliflozin (JARDIANCE) 10 MG TABS tablet, Take 1 tablet (  10 mg total) by mouth daily before breakfast., Disp: 30 tablet, Rfl: 11 .  finasteride (PROSCAR) 5 MG tablet, TAKE ONE TABLET EVERY DAY, Disp: 90 tablet, Rfl: 1 .  latanoprost (XALATAN) 0.005 % ophthalmic solution, Place 1 drop into both eyes at bedtime., Disp: , Rfl:  .  lidocaine (LIDODERM) 5 %, Place 1 patch onto the skin at bedtime. Remove & Discard patch within 12 hours or as directed by MD, Disp: , Rfl:  .  losartan (COZAAR) 100 MG tablet, TAKE ONE TABLET EVERY DAY, Disp: 90 tablet, Rfl:  1 .  meloxicam (MOBIC) 7.5 MG tablet, TAKE ONE TABLET BY MOUTH EVERY MORNING AND AT BEDTIME, Disp: 180 tablet, Rfl: 0 .  metFORMIN (GLUCOPHAGE) 500 MG tablet, TAKE ONE TABLET BY MOUTH TWICE DAILY WITH A MEAL, Disp: 180 tablet, Rfl: 1 .  mirtazapine (REMERON) 15 MG tablet, Take 1 tablet (15 mg total) by mouth at bedtime., Disp: 30 tablet, Rfl: 5 .  pantoprazole (PROTONIX) 20 MG tablet, Take 1 tablet (20 mg total) by mouth daily., Disp: 30 tablet, Rfl: 6 .  polyethylene glycol powder (MIRALAX) 17 GM/SCOOP powder, Take 1 Container by mouth daily as needed for mild constipation or moderate constipation. , Disp: , Rfl:  .  spironolactone (ALDACTONE) 25 MG tablet, TAKE 1/2 TABLET EVERYDAY, Disp: 45 tablet, Rfl: 0 .  tamsulosin (FLOMAX) 0.4 MG CAPS capsule, TAKE 2 CAPSULES EVERY DAY, Disp: 180 capsule, Rfl: 3 .  timolol (TIMOPTIC) 0.5 % ophthalmic solution, Place 1 drop into both eyes 2 (two) times daily. , Disp: , Rfl:  .  cloNIDine (CATAPRES - DOSED IN MG/24 HR) 0.1 mg/24hr patch, 0.1 mg once a week. (Patient not taking: Reported on 03/20/2020), Disp: , Rfl:    Observations/Objective: Blood pressure 116/72, pulse (!) 50, temperature 98.6 F (37 C), height 5\' 11"  (1.803 m), weight 175 lb (79.4 kg), SpO2 98 %.  Physical Exam Constitutional:      Appearance: He is well-developed.  HENT:     Head: Normocephalic.     Right Ear: Hearing normal.     Left Ear: Hearing normal.     Nose: Nose normal.  Neck:     Thyroid: No thyroid mass or thyromegaly.     Vascular: No carotid bruit.     Trachea: Trachea normal.  Cardiovascular:     Rate and Rhythm: Normal rate and regular rhythm.     Pulses: Normal pulses.     Heart sounds: Heart sounds not distant. No murmur heard.  No friction rub. No gallop.      Comments: No peripheral edema Pulmonary:     Effort: Pulmonary effort is normal. No respiratory distress.     Breath sounds: Normal breath sounds.  Skin:    General: Skin is warm and dry.      Findings: No rash.  Psychiatric:        Speech: Speech normal.        Behavior: Behavior normal.        Thought Content: Thought content normal.    Diabetic foot exam: Normal inspection except skin purplish in feet No skin breakdown No calluses  Normal DP pulses Normal sensation to light touch and monofilament Nails normal   Assessment and Plan   Diabetes mellitus with cardiac complication (HCC)  Tolerable control given age... avoid concentrated sweets. Continue jardiance and metfomrin.  CKD (chronic kidney disease) stage 3, GFR 30-59 ml/min (HCC)   Stable control. On ARB.   HFrEF (heart failure with reduced ejection  fraction) ([place])  Followed by cardiology.  Essential hypertension, benign  Dizziness resolved on lower dose of coreg.    Eliezer Lofts, MD

## 2020-03-20 NOTE — Assessment & Plan Note (Signed)
Tolerable control given age... avoid concentrated sweets. Continue jardiance and metfomrin.

## 2020-03-20 NOTE — Therapy (Signed)
[facility] [address], [place], [address] Phone: [phone]   Fax:  [phone]  Physical Therapy Treatment  Patient Details  Name: Dustin Harrison MRN: [id number] Date of Birth: 1932/01/22 No data recorded  Encounter Date: 03/20/2020   PT End of Session - 03/20/20 0958    Visit Number 5    Number of Visits 17    Date for PT Re-Evaluation 04/25/20    Authorization - Visit Number 5    Authorization - Number of Visits 10    PT Start Time 0948    PT Stop Time 1026    PT Time Calculation (min) 38 min    Activity Tolerance Patient tolerated treatment well    Behavior During Therapy [facility] for tasks assessed/performed           Past Medical History:  Diagnosis Date  . Abnormal glucose   . Arthritis   . Baker's cyst of knee    left  . BPH (benign prostatic hypertrophy)   . Cough    because of "tight" esophagus  . Diabetes mellitus   . Glaucoma   . HOH (hard of hearing)   . Hypertension   . Pheochromocytoma of right adrenal gland   . Ulcer   . Wears hearing aid    bilateral    Past Surgical History:  Procedure Laterality Date  . adrenaletomy    . CARDIAC CATHETERIZATION    . CATARACT EXTRACTION W/PHACO Left 10/08/2015   Procedure: CATARACT EXTRACTION PHACO AND INTRAOCULAR LENS PLACEMENT ([place]) left eye;  Surgeon: Leandrew Koyanagi, MD;  Location: [place];  Service: Ophthalmology;  Laterality: Left;  MALYUGIN  . HERNIA REPAIR    . RIGHT/LEFT HEART CATH AND CORONARY ANGIOGRAPHY Left 11/28/2019   Procedure: RIGHT/LEFT HEART CATH AND CORONARY ANGIOGRAPHY;  Surgeon: Nelva Bush, MD;  Location: [place] [facility];  Service: Cardiovascular;  Laterality: Left;  . SQUAMOUS CELL CARCINOMA EXCISION  03-2006   left ear  . TEE WITHOUT CARDIOVERSION N/A 10/12/2019   Procedure: TRANSESOPHAGEAL ECHOCARDIOGRAM (TEE);  Surgeon: Minna Merritts, MD;  Location: [facility];  Service:  Cardiovascular;  Laterality: N/A;  . TONSILLECTOMY    . XI ROBOTIC LAPAROSCOPIC ASSISTED APPENDECTOMY N/A 10/11/2019   Procedure: XI ROBOTIC ASSISTED LAPAROSCOPIC INSERTION GASTROSTOMY TUBE;  Surgeon: Jules Husbands, MD;  Location: [facility];  Service: General;  Laterality: N/A;    There were no vitals filed for this visit.   Subjective Assessment - 03/20/20 0953    Subjective Reports he is feeling good overall. Very minimal pain overall, giving his LB 2/10 on NPRS. Compliance with HEP    Pertinent History Pt is a 86 year old male presenting with R sided LBP >30years with R sided knee pain. Reports he used to have sciatica symptoms, does not experience them currently; denies pins/needles, and electrical sesnations. Has increased R sided LBP and R knee pain following stroke May 2020, reports no remaining motor or sensation deficits, and loss of L vocal cords. Reports worst knee pain is 4/10 LBP 3/10; best 0/10 for each. Reports pain in each is a dull ache and they come on seperately. Sleeping on R side, STS transfer, walking aggravates knee pain, reports some sensations that he feels like his knee is going to give way. LBP is aggravated by bending forward and sleeping on his back, he completes "posterior pelvic tilts" regularly which he reports helps his knee pain. Endorses 1 fall/month d/t dizziness, PCP  is following him for this. Patient reports he would like to be able to walk more, limited by pain in R knee and back. Pt drives, completes own shopping by leaning on cart, lives with wife, complets ADLs ind. Pt denies N/V, B&B changes, saddle paresthesia, fever, night sweats, or unrelenting night pain at this time. Pt reports he has lost a lot of weight since stroke, but he is being following for this. Patient is very [place] and has cochlear implant    Limitations Walking;House hold activities;Lifting;Standing    How long can you sit comfortably? unlimited    How long can you stand comfortably? limited by  dizziness only    How long can you walk comfortably? 26mins    Patient Stated Goals walk more without pain    Pain Onset More than a month ago    Pain Onset More than a month ago              Ther-Ex Nustep L5 57mins seatand UE setting 11, min cuing to maintain SPM over 70 Mini squat to Nustep seat with 10# DB swing 3x 10 with min cuing for set up and full hip ext with good carry over Kicking soccer ball from pass 2x 12 each LE with cuing for foot return to midline following kick and for "whind up" with good carry over Alt cone taps x20; with 2 stacked cones x20 with cuing for control with step down, increased difficulty L>R Prone press/cobra stretch 3x 15sec   Gait Training 153ft with 7.5# AW on RLE for increased propulsion/foot clearance with success; adding 10 hurdle negotiation w/ 7.5AW 139ft with good carry over following demo; 10% incline 1.70mph 24mins; supervision throughout, cuing mostly for LLE clearance with good carry over O2 following 98%; HR 74% increased rest time following d/t fatigue       PT Education - 03/20/20 0956    Education Details therex form/technique, gait training    Person(s) Educated Patient    Methods Explanation;Demonstration;Verbal cues    Comprehension Verbalized understanding;Returned demonstration;Verbal cues required            PT Short Term Goals - 02/27/20 1151      PT SHORT TERM GOAL #1   Title Pt will be independent with HEP in order to improve strength and decrease back pain in order to improve pain-free function at home and work.    Baseline 02/27/20 HEP given    Time 4    Period Weeks    Status New             PT Long Term Goals - 02/27/20 1152      PT LONG TERM GOAL #1   Title Patient will increase FOTO score to 63 to demonstrate predicted increase in functional mobility to complete ADLs    Baseline 02/27/20 48    Time 8    Period Weeks    Status New      PT LONG TERM GOAL #2   Title Pt will improve BERG to at least  45/56 in order to demonstrate clinically decreased fall risk cut off score    Baseline 02/27/20 40/56    Time 8    Period Weeks    Status New      PT LONG TERM GOAL #3   Title Pt will decrease 5TSTS by at least 3 seconds in order to demonstrate clinically significant improvement in LE strength    Baseline 02/27/20 19sec    Time 8  Period Weeks    Status New      PT LONG TERM GOAL #4   Title Pt will increase 10MWT to at least 1.56m/s in order to demonstrate decreased fall risk with normalized community ambulation speed    Baseline 02/27/20 Self-selected: s = 0.14m/s ; Fastest: s = 1.15m/s    Time 8    Period Weeks    Status New      PT LONG TERM GOAL #5   Title Pt will decrease worst back pain as reported on NPRS by at least 2 points in order to demonstrate clinically significant reduction in back pain.    Baseline 02/27/20 5/10    Time 8    Period Weeks    Status New                 Plan - 03/20/20 1001    Clinical Impression Statement PT continued therex progression for increased BLE strength, normalized gait, and dynamic balance with success. Pt is able to comply with all cuing for proper technique of therex, gaurding needed for safety, and good motivation throughout session. PT will continue progression as able.    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/Moderate complexity    Clinical Decision Making Moderate    Rehab Potential Good    PT Frequency 2x / week    PT Duration 8 weeks    PT Treatment/Interventions ADLs/Self Care Home Management;Iontophoresis 4mg /ml  Dexamethasone;Stair training;Therapeutic exercise;Passive range of motion;Spinal Manipulations;Joint Manipulations;Dry needling;Manual techniques;Patient/family education;Balance training;Neuromuscular re-education;Therapeutic activities;Functional mobility training;Moist Heat;Electrical Stimulation;Cryotherapy;Gait training;Ultrasound;DME Instruction    PT Next Visit Plan glute strengthening, gait strengthening,  balance,    PT Home Exercise Plan bridge, mini squat, glute stretch    Consulted and Agree with Plan of Care Patient           Patient will benefit from skilled therapeutic intervention in order to improve the following deficits and impairments:  Abnormal gait, Decreased balance, Decreased endurance, Decreased mobility, Difficulty walking, Cardiopulmonary status limiting activity, Decreased range of motion, Decreased activity tolerance, Decreased coordination, Decreased strength, Increased fascial restricitons, Dizziness, Impaired flexibility, Postural dysfunction, Pain, Improper body mechanics  Visit Diagnosis: Chronic pain of right knee  Chronic right-sided low back pain without sciatica  Other abnormalities of gait and mobility     Problem List Patient Active Problem List   Diagnosis Date Noted  . Vocal cord paralysis 12/27/2019  . Acute HFrEF (heart failure with reduced ejection fraction) ([place]) 11/28/2019  . Chronic systolic CHF (congestive heart failure) ([place]) 10/13/2019  . Malnutrition of moderate degree 10/12/2019  . HFrEF (heart failure with reduced ejection fraction) ([place])   . Cardiomyopathy ([place])   . Bacteremia   . AKI (acute kidney injury) ([place]) 10/07/2019  . Chronic back pain 10/07/2019  . Leukocytosis 10/07/2019  . Acute neck pain 05/25/2019  . CKD (chronic kidney disease) stage 2, GFR 60-89 ml/min 04/23/2016  . Hearing loss 04/23/2016  . Counseling regarding end of life decision making 04/15/2015  . Essential hypertension, benign 08/22/2012  . Diabetes  mellitus without complication ([place]) 51/76/1607  . HYPERCHOLESTEROLEMIA 05/12/2009  . History of pheochromocytoma 09/07/2007  . GLAUCOMA 09/07/2007  . History of duodenal ulcer 09/07/2007  . BPH (benign prostatic hyperplasia) 09/07/2007  . OSTEOARTHRITIS 09/07/2007   Durwin Reges DPT  Durwin Reges 03/20/2020, 10:29 AM  [place] [facility] [address], [place], [address] Phone: [phone]   Fax:  [phone]  Name: Mckinnley Smithey MRN: [id number] Date of Birth: 03-06-1932

## 2020-03-20 NOTE — Patient Instructions (Signed)
Work on avoiding high carb foods, concentrated sweets but keep up with nutrition!

## 2020-03-24 ENCOUNTER — Ambulatory Visit: Payer: Medicare PPO | Admitting: Physical Therapy

## 2020-03-24 ENCOUNTER — Encounter: Payer: Self-pay | Admitting: Physical Therapy

## 2020-03-24 ENCOUNTER — Other Ambulatory Visit: Payer: Self-pay

## 2020-03-24 DIAGNOSIS — M545 Low back pain, unspecified: Secondary | ICD-10-CM | POA: Diagnosis not present

## 2020-03-24 DIAGNOSIS — G8929 Other chronic pain: Secondary | ICD-10-CM | POA: Diagnosis not present

## 2020-03-24 DIAGNOSIS — M25561 Pain in right knee: Secondary | ICD-10-CM

## 2020-03-24 DIAGNOSIS — R2689 Other abnormalities of gait and mobility: Secondary | ICD-10-CM | POA: Diagnosis not present

## 2020-03-24 NOTE — Therapy (Signed)
[place] [facility] [address], [place], [address] Phone: [phone]   Fax:  [phone]  Physical Therapy Treatment  Patient Details  Name: Dustin Harrison MRN: [id number] Date of Birth: 03-06-1932 No data recorded  Encounter Date: 03/24/2020   PT End of Session - 03/24/20 0827    Visit Number 6    Number of Visits 17    Date for PT Re-Evaluation 04/25/20    Authorization - Visit Number 6    Authorization - Number of Visits 10    PT Start Time 0820    PT Stop Time 0900    PT Time Calculation (min) 40 min    Activity Tolerance Patient tolerated treatment well    Behavior During Therapy [facility] for tasks assessed/performed           Past Medical History:  Diagnosis Date  . Abnormal glucose   . Arthritis   . Baker's cyst of knee    left  . BPH (benign prostatic hypertrophy)   . Cough    because of "tight" esophagus  . Diabetes mellitus   . Glaucoma   . HOH (hard of hearing)   . Hypertension   . Pheochromocytoma of right adrenal gland   . Ulcer   . Wears hearing aid    bilateral    Past Surgical History:  Procedure Laterality Date  . adrenaletomy    . CARDIAC CATHETERIZATION    . CATARACT EXTRACTION W/PHACO Left 10/08/2015   Procedure: CATARACT EXTRACTION PHACO AND INTRAOCULAR LENS PLACEMENT ([place]) left eye;  Surgeon: Leandrew Koyanagi, MD;  Location: [place];  Service: Ophthalmology;  Laterality: Left;  MALYUGIN  . HERNIA REPAIR    . RIGHT/LEFT HEART CATH AND CORONARY ANGIOGRAPHY Left 11/28/2019   Procedure: RIGHT/LEFT HEART CATH AND CORONARY ANGIOGRAPHY;  Surgeon: Nelva Bush, MD;  Location: [place] [facility];  Service: Cardiovascular;  Laterality: Left;  . SQUAMOUS CELL CARCINOMA EXCISION  03-2006   left ear  . TEE WITHOUT CARDIOVERSION N/A 10/12/2019   Procedure: TRANSESOPHAGEAL ECHOCARDIOGRAM (TEE);  Surgeon: Minna Merritts, MD;  Location: [facility];  Service:  Cardiovascular;  Laterality: N/A;  . TONSILLECTOMY    . XI ROBOTIC LAPAROSCOPIC ASSISTED APPENDECTOMY N/A 10/11/2019   Procedure: XI ROBOTIC ASSISTED LAPAROSCOPIC INSERTION GASTROSTOMY TUBE;  Surgeon: Jules Husbands, MD;  Location: [facility];  Service: General;  Laterality: N/A;    There were no vitals filed for this visit.   Subjective Assessment - 03/24/20 9794    Subjective Pt reports he was dizzy over the weekend, but he thinks it is getting better overall since his MD "changed his meds". Pt endorses fall Friday, with dizziness once he stood up from a chair, and fell into floor. He was able to get out of the floor with help from the floor.    Pertinent History Pt is a 86 year old male presenting with R sided LBP >30years with R sided knee pain. Reports he used to have sciatica symptoms, does not experience them currently; denies pins/needles, and electrical sesnations. Has increased R sided LBP and R knee pain following stroke May 2020, reports no remaining motor or sensation deficits, and loss of L vocal cords. Reports worst knee pain is 4/10 LBP 3/10; best 0/10 for each. Reports pain in each is a dull ache and they come on seperately. Sleeping on R side, STS transfer, walking aggravates knee pain, reports some sensations that he feels like his knee is  going to give way. LBP is aggravated by bending forward and sleeping on his back, he completes "posterior pelvic tilts" regularly which he reports helps his knee pain. Endorses 1 fall/month d/t dizziness, PCP is following him for this. Patient reports he would like to be able to walk more, limited by pain in R knee and back. Pt drives, completes own shopping by leaning on cart, lives with wife, complets ADLs ind. Pt denies N/V, B&B changes, saddle paresthesia, fever, night sweats, or unrelenting night pain at this time. Pt reports he has lost a lot of weight since stroke, but he is being following for this. Patient is very [place] and has cochlear implant     Limitations Walking;House hold activities;Lifting;Standing    How long can you sit comfortably? unlimited    How long can you stand comfortably? limited by dizziness only    How long can you walk comfortably? 28mins    Patient Stated Goals walk more without pain    Pain Onset More than a month ago    Pain Onset More than a month ago           Ther-Ex Nustep L5 51mins seatand UE setting 11, min cuing to maintain SPM over70 Mini squat to Nustep seat with overhead press 5# DB bilat 3x 8 with fatigue between sets.  Prone press/cobra stretch 5x 15sec   Gait Training 160ft with 7.5# AW on RLE for increased propulsion/foot clearance with success; with speed changes w/ 7.5AW 266ft with supervision, with head turns w/ 7.5# AW with CGA for safety Side Stepping with 7.5# AW 34ft R 32ft L; over 6in hurdles 65ft R 85ft L with cuing for "large step for foot clearance" Amb with 7.5# AW 10% incline 1.75mph 77mins; supervision throughout, cuing mostly for bilat heel strike with good carry over Vitals monitored throughout session, WNL               PT Education - 03/24/20 0826    Education Details therex form/techqnique, gait training    Person(s) Educated Patient    Methods Explanation;Demonstration;Verbal cues    Comprehension Verbalized understanding;Returned demonstration;Verbal cues required            PT Short Term Goals - 02/27/20 1151      PT SHORT TERM GOAL #1   Title Pt will be independent with HEP in order to improve strength and decrease back pain in order to improve pain-free function at home and work.    Baseline 02/27/20 HEP given    Time 4    Period Weeks    Status New             PT Long Term Goals - 02/27/20 1152      PT LONG TERM GOAL #1   Title Patient will increase FOTO score to 63 to demonstrate predicted increase in functional mobility to complete ADLs    Baseline 02/27/20 48    Time 8    Period Weeks    Status New      PT LONG TERM GOAL #2    Title Pt will improve BERG to at least 45/56 in order to demonstrate clinically decreased fall risk cut off score    Baseline 02/27/20 40/56    Time 8    Period Weeks    Status New      PT LONG TERM GOAL #3   Title Pt will decrease 5TSTS by at least 3 seconds in order to demonstrate clinically significant improvement in LE strength  Baseline 02/27/20 19sec    Time 8    Period Weeks    Status New      PT LONG TERM GOAL #4   Title Pt will increase 10MWT to at least 1.72m/s in order to demonstrate decreased fall risk with normalized community ambulation speed    Baseline 02/27/20 Self-selected: s = 0.5m/s ; Fastest: s = 1.61m/s    Time 8    Period Weeks    Status New      PT LONG TERM GOAL #5   Title Pt will decrease worst back pain as reported on NPRS by at least 2 points in order to demonstrate clinically significant reduction in back pain.    Baseline 02/27/20 5/10    Time 8    Period Weeks    Status New                 Plan - 03/24/20 0853    Clinical Impression Statement PT continued therex progression for increased BLE strength, normalized gait, and increased endurance with success. Paitent is able to complete all high intensity gait training with good carry over of all cuing and gaurding needed for safety. Patient is motivated throughout session with no increased pain, normalized vitals through session. PT will continue progression as able.    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/Moderate complexity    Clinical Decision Making Moderate    Rehab Potential Good    PT Frequency 2x  / week    PT Duration 8 weeks    PT Treatment/Interventions ADLs/Self Care Home Management;Iontophoresis 4mg /ml Dexamethasone;Stair training;Therapeutic exercise;Passive range of motion;Spinal Manipulations;Joint Manipulations;Dry needling;Manual techniques;Patient/family education;Balance training;Neuromuscular re-education;Therapeutic activities;Functional mobility training;Moist Heat;Electrical Stimulation;Cryotherapy;Gait training;Ultrasound;DME Instruction    PT Next Visit Plan glute strengthening, gait strengthening,  balance,    PT Home Exercise Plan bridge, mini squat, glute stretch    Consulted and Agree with Plan of Care Patient           Patient will benefit from skilled therapeutic intervention in order to improve the following deficits and impairments:  Abnormal gait, Decreased balance, Decreased endurance, Decreased mobility, Difficulty walking, Cardiopulmonary status limiting activity, Decreased range of motion, Decreased activity tolerance, Decreased coordination, Decreased strength, Increased fascial restricitons, Dizziness, Impaired flexibility, Postural dysfunction, Pain, Improper body mechanics  Visit Diagnosis: Chronic pain of right knee  Chronic right-sided low back pain without sciatica  Other abnormalities of gait and mobility     Problem List Patient Active Problem List   Diagnosis Date Noted  . Vocal cord paralysis 12/27/2019  . Malnutrition of moderate degree 10/12/2019  . HFrEF (heart failure with reduced ejection fraction) ([place])   . Cardiomyopathy ([place])   . Bacteremia   . AKI (acute kidney injury) ([place]) 10/07/2019  . Chronic back pain 10/07/2019  . Leukocytosis 10/07/2019  . Acute neck pain 05/25/2019  . CKD (chronic kidney disease) stage 3, GFR 30-59 ml/min ([facility]) 04/23/2016  . Hearing loss 04/23/2016  . Counseling regarding end of life decision making 04/15/2015  . Essential hypertension, benign 08/22/2012  . Diabetes mellitus with cardiac  complication ([place]) 02/72/5366  . HYPERCHOLESTEROLEMIA 05/12/2009  . History of pheochromocytoma 09/07/2007  . GLAUCOMA 09/07/2007  . History of duodenal ulcer 09/07/2007  . BPH (benign prostatic hyperplasia) 09/07/2007  . OSTEOARTHRITIS 09/07/2007  Durwin Reges DPT Durwin Reges 03/24/2020, 9:03 AM  [facility] [facility] [address] [place], [address] Phone: [phone]   Fax:  [phone]  Name: Dustin Harrison MRN: [id number] Date of Birth: 11/07/1931

## 2020-03-25 ENCOUNTER — Encounter: Payer: Medicare PPO | Admitting: Physical Therapy

## 2020-03-27 ENCOUNTER — Ambulatory Visit: Payer: Medicare PPO | Admitting: Physical Therapy

## 2020-03-27 ENCOUNTER — Encounter: Payer: Self-pay | Admitting: Physical Therapy

## 2020-03-27 ENCOUNTER — Other Ambulatory Visit: Payer: Self-pay

## 2020-03-27 DIAGNOSIS — R2689 Other abnormalities of gait and mobility: Secondary | ICD-10-CM

## 2020-03-27 DIAGNOSIS — M545 Low back pain, unspecified: Secondary | ICD-10-CM | POA: Diagnosis not present

## 2020-03-27 DIAGNOSIS — M25561 Pain in right knee: Secondary | ICD-10-CM | POA: Diagnosis not present

## 2020-03-27 DIAGNOSIS — G8929 Other chronic pain: Secondary | ICD-10-CM

## 2020-03-27 NOTE — Therapy (Signed)
[facility] [address], [place], [address] Phone: [phone]   Fax:  [phone]  Physical Therapy Treatment  Patient Details  Name: Dustin Harrison MRN: [id number] Date of Birth: [birth year] No data recorded  Encounter Date: 03/27/2020   PT End of Session - 03/27/20 0951    Visit Number 7    Number of Visits 17    Date for PT Re-Evaluation 04/25/20    Authorization - Visit Number 7    Authorization - Number of Visits 10    PT Start Time 0945    PT Stop Time 1025    PT Time Calculation (min) 40 min    Activity Tolerance Patient tolerated treatment well    Behavior During Therapy [facility] for tasks assessed/performed           Past Medical History:  Diagnosis Date   Abnormal glucose    Arthritis    Baker's cyst of knee    left   BPH (benign prostatic hypertrophy)    Cough    because of "tight" esophagus   Diabetes mellitus    Glaucoma    HOH (hard of hearing)    Hypertension    Pheochromocytoma of right adrenal gland    Ulcer    Wears hearing aid    bilateral    Past Surgical History:  Procedure Laterality Date   adrenaletomy     CARDIAC CATHETERIZATION     CATARACT EXTRACTION W/PHACO Left 10/08/2015   Procedure: CATARACT EXTRACTION PHACO AND INTRAOCULAR LENS PLACEMENT ([place]) left eye;  Surgeon: Leandrew Koyanagi, MD;  Location: [place];  Service: Ophthalmology;  Laterality: Left;  [place]     RIGHT/LEFT HEART CATH AND CORONARY ANGIOGRAPHY Left 11/28/2019   Procedure: RIGHT/LEFT HEART CATH AND CORONARY ANGIOGRAPHY;  Surgeon: Nelva Bush, MD;  Location: [place] [facility];  Service: Cardiovascular;  Laterality: Left;   SQUAMOUS CELL CARCINOMA EXCISION  03-2006   left ear   TEE WITHOUT CARDIOVERSION N/A 10/12/2019   Procedure: TRANSESOPHAGEAL ECHOCARDIOGRAM (TEE);  Surgeon: Minna Merritts, MD;  Location: [facility];  Service:  Cardiovascular;  Laterality: N/A;   TONSILLECTOMY     XI ROBOTIC LAPAROSCOPIC ASSISTED APPENDECTOMY N/A 10/11/2019   Procedure: XI ROBOTIC ASSISTED LAPAROSCOPIC INSERTION GASTROSTOMY TUBE;  Surgeon: Jules Husbands, MD;  Location: [facility];  Service: General;  Laterality: N/A;    There were no vitals filed for this visit.   Subjective Assessment - 03/27/20 0948    Subjective Patient reports he is not using the lidocane patch for his back pain anymore. Reports no pain in R knee, just weakness. Compliance with HEP.    Pertinent History Pt is a 86 year old male presenting with R sided LBP >30years with R sided knee pain. Reports he used to have sciatica symptoms, does not experience them currently; denies pins/needles, and electrical sesnations. Has increased R sided LBP and R knee pain following stroke May 2020, reports no remaining motor or sensation deficits, and loss of L vocal cords. Reports worst knee pain is 4/10 LBP 3/10; best 0/10 for each. Reports pain in each is a dull ache and they come on seperately. Sleeping on R side, STS transfer, walking aggravates knee pain, reports some sensations that he feels like his knee is going to give way. LBP is aggravated by bending forward and sleeping on his back, he completes "posterior pelvic tilts" regularly which he reports helps his knee pain.  Endorses 1 fall/month d/t dizziness, PCP is following him for this. Patient reports he would like to be able to walk more, limited by pain in R knee and back. Pt drives, completes own shopping by leaning on cart, lives with wife, complets ADLs ind. Pt denies N/V, B&B changes, saddle paresthesia, fever, night sweats, or unrelenting night pain at this time. Pt reports he has lost a lot of weight since stroke, but he is being following for this. Patient is very [place] and has cochlear implant    Limitations Walking;House hold activities;Lifting;Standing    How long can you sit comfortably? unlimited    How long can you  stand comfortably? limited by dizziness only    How long can you walk comfortably? 54mins    Patient Stated Goals walk more without pain    Pain Onset More than a month ago    Pain Onset More than a month ago             Ther-Ex Nustep L5 42mins; L6 79mins seatand UE setting 11, min cuing to maintain SPM over70 Alt step over 6in hurdle with 10# AW on RLE 2x 12 with cuing for DF with foot clearance  Mini squat toNustep with foam pad under LLE 3x 10 with good carry over of cuing for technique OMEGA leg press 15# 3x 8 (attempted 20# unable) Prone press/cobra stretch3x 15sec  Gait Training 137ft with10# AW on RLE for increased propulsion/foot clearance with success; with BlackTB post resistance for increased forward propusion 10# AW 237ft Amb with 10# AW 10% incline 1.60mph3mins; supervision throughout, cuingmostly for bilat heel strike with good carry over Vitals monitored throughout session, WNL                         PT Education - 03/27/20 0951    Education Details therex form/technique, gait training    Person(s) Educated Patient    Methods Explanation;Demonstration;Tactile cues;Verbal cues    Comprehension Verbalized understanding;Returned demonstration;Verbal cues required;Tactile cues required            PT Short Term Goals - 02/27/20 1151      PT SHORT TERM GOAL #1   Title Pt will be independent with HEP in order to improve strength and decrease back pain in order to improve pain-free function at home and work.    Baseline 02/27/20 HEP given    Time 4    Period Weeks    Status New             PT Long Term Goals - 02/27/20 1152      PT LONG TERM GOAL #1   Title Patient will increase FOTO score to 63 to demonstrate predicted increase in functional mobility to complete ADLs    Baseline 02/27/20 48    Time 8    Period Weeks    Status New      PT LONG TERM GOAL #2   Title Pt will improve BERG to at least 45/56 in order to demonstrate  clinically decreased fall risk cut off score    Baseline 02/27/20 40/56    Time 8    Period Weeks    Status New      PT LONG TERM GOAL #3   Title Pt will decrease 5TSTS by at least 3 seconds in order to demonstrate clinically significant improvement in LE strength    Baseline 02/27/20 19sec    Time 8    Period Weeks  Status New      PT LONG TERM GOAL #4   Title Pt will increase 10MWT to at least 1.28m/s in order to demonstrate decreased fall risk with normalized community ambulation speed    Baseline 02/27/20 Self-selected: s = 0.29m/s ; Fastest: s = 1.106m/s    Time 8    Period Weeks    Status New      PT LONG TERM GOAL #5   Title Pt will decrease worst back pain as reported on NPRS by at least 2 points in order to demonstrate clinically significant reduction in back pain.    Baseline 02/27/20 5/10    Time 8    Period Weeks    Status New                 Plan - 03/27/20 1012    Clinical Impression Statement PT continued therex progresison for increased BLE strength (RLE focus), normalized gait and functional movements with success. Patietn is able to comply with all cuing for proper technique of therex with multimodal cuing, good motivation throughout session, and no increased pain. Gaurding for safety. PT will continue progression as able.    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/Moderate complexity    Clinical Decision Making Moderate    Rehab Potential Good    PT Frequency 2x / week    PT Duration 8 weeks    PT Treatment/Interventions ADLs/Self Care Home Management;Iontophoresis  4mg /ml Dexamethasone;Stair training;Therapeutic exercise;Passive range of motion;Spinal Manipulations;Joint Manipulations;Dry needling;Manual techniques;Patient/family education;Balance training;Neuromuscular re-education;Therapeutic activities;Functional mobility training;Moist Heat;Electrical Stimulation;Cryotherapy;Gait training;Ultrasound;DME Instruction    PT Next Visit Plan glute strengthening, gait strengthening,  balance,    PT Home Exercise Plan bridge, mini squat, glute stretch    Consulted and Agree with Plan of Care Patient           Patient will benefit from skilled therapeutic intervention in order to improve the following deficits and impairments:  Abnormal gait, Decreased balance, Decreased endurance, Decreased mobility, Difficulty walking, Cardiopulmonary status limiting activity, Decreased range of motion, Decreased activity tolerance, Decreased coordination, Decreased strength, Increased fascial restricitons, Dizziness, Impaired flexibility, Postural dysfunction, Pain, Improper body mechanics  Visit Diagnosis: No diagnosis found.     Problem List Patient Active Problem List   Diagnosis Date Noted   Vocal cord paralysis 12/27/2019   Malnutrition of moderate degree 10/12/2019   HFrEF (heart failure with reduced ejection fraction) (HCC)    Cardiomyopathy ([place])    Bacteremia    AKI (acute kidney injury) ([place]) 10/07/2019   Chronic back pain 10/07/2019   Leukocytosis 10/07/2019   Acute neck pain 05/25/2019   CKD (chronic kidney disease) stage 3, GFR 30-59 ml/min (HCC) 04/23/2016   Hearing loss 04/23/2016   Counseling regarding end of life decision making 04/15/2015   Essential hypertension, benign 08/22/2012   Diabetes mellitus with cardiac complication ([place]) 82/50/5397   HYPERCHOLESTEROLEMIA 05/12/2009   History of pheochromocytoma 09/07/2007   GLAUCOMA 09/07/2007   History of duodenal ulcer 09/07/2007   BPH (benign prostatic hyperplasia)  09/07/2007   OSTEOARTHRITIS 09/07/2007   Durwin Reges DPT Durwin Reges 03/27/2020, 10:26 AM  [facility] [address], [place], [address] Phone: [phone]   Fax:  [phone]  Name: Dustin Harrison  MRN: [id number] Date of Birth: December 22, 1931

## 2020-03-31 ENCOUNTER — Other Ambulatory Visit: Payer: Self-pay

## 2020-03-31 ENCOUNTER — Encounter: Payer: Self-pay | Admitting: Physical Therapy

## 2020-03-31 ENCOUNTER — Ambulatory Visit: Payer: Medicare PPO | Admitting: Physical Therapy

## 2020-03-31 DIAGNOSIS — R2689 Other abnormalities of gait and mobility: Secondary | ICD-10-CM | POA: Diagnosis not present

## 2020-03-31 DIAGNOSIS — G8929 Other chronic pain: Secondary | ICD-10-CM

## 2020-03-31 DIAGNOSIS — M545 Low back pain, unspecified: Secondary | ICD-10-CM | POA: Diagnosis not present

## 2020-03-31 DIAGNOSIS — M25561 Pain in right knee: Secondary | ICD-10-CM

## 2020-03-31 NOTE — Therapy (Signed)
[facility] [address] [place], [address] Phone: [phone]   Fax:  [phone]  Physical Therapy Treatment  Patient Details  Name: Dustin Harrison MRN: [id number] Date of Birth: 09-17-1931 No data recorded  Encounter Date: 03/31/2020   PT End of Session - 03/31/20 1042    Visit Number 8    Number of Visits 17    Date for PT Re-Evaluation 04/25/20    Authorization - Visit Number 8    Authorization - Number of Visits 10    PT Start Time 1030    PT Stop Time 1110    PT Time Calculation (min) 40 min    Activity Tolerance Patient tolerated treatment well    Behavior During Therapy [facility] for tasks assessed/performed           Past Medical History:  Diagnosis Date  . Abnormal glucose   . Arthritis   . Baker's cyst of knee    left  . BPH (benign prostatic hypertrophy)   . Cough    because of "tight" esophagus  . Diabetes mellitus   . Glaucoma   . HOH (hard of hearing)   . Hypertension   . Pheochromocytoma of right adrenal gland   . Ulcer   . Wears hearing aid    bilateral    Past Surgical History:  Procedure Laterality Date  . adrenaletomy    . CARDIAC CATHETERIZATION    . CATARACT EXTRACTION W/PHACO Left 10/08/2015   Procedure: CATARACT EXTRACTION PHACO AND INTRAOCULAR LENS PLACEMENT ([place]) left eye;  Surgeon: Leandrew Koyanagi, MD;  Location: [place];  Service: Ophthalmology;  Laterality: Left;  [place]  . HERNIA REPAIR    . RIGHT/LEFT HEART CATH AND CORONARY ANGIOGRAPHY Left 11/28/2019   Procedure: RIGHT/LEFT HEART CATH AND CORONARY ANGIOGRAPHY;  Surgeon: Nelva Bush, MD;  Location: [place] [facility];  Service: Cardiovascular;  Laterality: Left;  . SQUAMOUS CELL CARCINOMA EXCISION  03-2006   left ear  . TEE WITHOUT CARDIOVERSION N/A 10/12/2019   Procedure: TRANSESOPHAGEAL ECHOCARDIOGRAM (TEE);  Surgeon: Minna Merritts, MD;  Location: [facility];  Service:  Cardiovascular;  Laterality: N/A;  . TONSILLECTOMY    . XI ROBOTIC LAPAROSCOPIC ASSISTED APPENDECTOMY N/A 10/11/2019   Procedure: XI ROBOTIC ASSISTED LAPAROSCOPIC INSERTION GASTROSTOMY TUBE;  Surgeon: Jules Husbands, MD;  Location: [facility];  Service: General;  Laterality: N/A;    There were no vitals filed for this visit.   Subjective Assessment - 03/31/20 1038    Subjective Pt had a fall this weekend, he was standing using the restroom and got dizzy and fell at 2-3am. He was able to crawl to a chair and get himself up. Reports no pain in RLE, just feels weak    Pertinent History Pt is a 86 year old male presenting with R sided LBP >30years with R sided knee pain. Reports he used to have sciatica symptoms, does not experience them currently; denies pins/needles, and electrical sesnations. Has increased R sided LBP and R knee pain following stroke May 2020, reports no remaining motor or sensation deficits, and loss of L vocal cords. Reports worst knee pain is 4/10 LBP 3/10; best 0/10 for each. Reports pain in each is a dull ache and they come on seperately. Sleeping on R side, STS transfer, walking aggravates knee pain, reports some sensations that he feels like his knee is going to give way. LBP is aggravated by bending forward and sleeping on his  back, he completes "posterior pelvic tilts" regularly which he reports helps his knee pain. Endorses 1 fall/month d/t dizziness, PCP is following him for this. Patient reports he would like to be able to walk more, limited by pain in R knee and back. Pt drives, completes own shopping by leaning on cart, lives with wife, complets ADLs ind. Pt denies N/V, B&B changes, saddle paresthesia, fever, night sweats, or unrelenting night pain at this time. Pt reports he has lost a lot of weight since stroke, but he is being following for this. Patient is very [place] and has cochlear implant    Limitations Walking;House hold activities;Lifting;Standing    How long can you sit  comfortably? unlimited    How long can you stand comfortably? limited by dizziness only    How long can you walk comfortably? 35mins    Patient Stated Goals walk more without pain    Pain Onset More than a month ago    Pain Onset More than a month ago           Ther-Ex Nustep L5 65mins; L6 72mins seatand UE setting 11, min cuing to maintain SPM over70 OMEGA leg press 15# 3x 10 cuing for eccentric control  Prone press/cobra stretch3x 15sec  Gait Training 145ft with10# AW on RLE for increased propulsion/foot clearance with success;with AW and randomly called out speed changes 162ft; vertical head turns 60ft; horizontal head turns 14ft; 153ft with 16 6in hurdle negotation with good carry over of foot clearance; CGA for safety, 1x seated rest Amb with 10# AW10% incline 1.67mph3mins; supervision throughout, cuingmostly forbilat heel strikewith good carry over Vitals monitored throughout session, WNL Prolonged seated rest following needed, normal vitals                          PT Education - 03/31/20 1042    Education Details therex form/technique, gait training    Person(s) Educated Patient    Methods Explanation;Demonstration;Verbal cues    Comprehension Verbalized understanding;Returned demonstration;Verbal cues required            PT Short Term Goals - 02/27/20 1151      PT SHORT TERM GOAL #1   Title Pt will be independent with HEP in order to improve strength and decrease back pain in order to improve pain-free function at home and work.    Baseline 02/27/20 HEP given    Time 4    Period Weeks    Status New             PT Long Term Goals - 02/27/20 1152      PT LONG TERM GOAL #1   Title Patient will increase FOTO score to 63 to demonstrate predicted increase in functional mobility to complete ADLs    Baseline 02/27/20 48    Time 8    Period Weeks    Status New      PT LONG TERM GOAL #2   Title Pt will improve BERG to at least 45/56  in order to demonstrate clinically decreased fall risk cut off score    Baseline 02/27/20 40/56    Time 8    Period Weeks    Status New      PT LONG TERM GOAL #3   Title Pt will decrease 5TSTS by at least 3 seconds in order to demonstrate clinically significant improvement in LE strength    Baseline 02/27/20 19sec    Time 8    Period Weeks  Status New      PT LONG TERM GOAL #4   Title Pt will increase 10MWT to at least 1.63m/s in order to demonstrate decreased fall risk with normalized community ambulation speed    Baseline 02/27/20 Self-selected: s = 0.16m/s ; Fastest: s = 1.4m/s    Time 8    Period Weeks    Status New      PT LONG TERM GOAL #5   Title Pt will decrease worst back pain as reported on NPRS by at least 2 points in order to demonstrate clinically significant reduction in back pain.    Baseline 02/27/20 5/10    Time 8    Period Weeks    Status New                 Plan - 03/31/20 1057    Clinical Impression Statement PT continued progression for dynamic balance with gait and BLE strengthening (RLE focus) with success. Pt is able to comply with all cuing for proper technique of therex with good motivation throughout session. PT encouraged patient in decreasing fall risk by using rollator and sitting to use restroom in the middle of the night, as that is when he is having the most dizziness resulting in falls. PT will continue progression as able.    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/Moderate complexity    Clinical Decision Making Moderate    Rehab Potential Good    PT  Frequency 2x / week    PT Duration 8 weeks    PT Treatment/Interventions ADLs/Self Care Home Management;Iontophoresis 4mg /ml Dexamethasone;Stair training;Therapeutic exercise;Passive range of motion;Spinal Manipulations;Joint Manipulations;Dry needling;Manual techniques;Patient/family education;Balance training;Neuromuscular re-education;Therapeutic activities;Functional mobility training;Moist Heat;Electrical Stimulation;Cryotherapy;Gait training;Ultrasound;DME Instruction    PT Next Visit Plan glute strengthening, gait strengthening,  balance,    PT Home Exercise Plan bridge, mini squat, glute stretch    Consulted and Agree with Plan of Care Patient           Patient will benefit from skilled therapeutic intervention in order to improve the following deficits and impairments:  Abnormal gait, Decreased balance, Decreased endurance, Decreased mobility, Difficulty walking, Cardiopulmonary status limiting activity, Decreased range of motion, Decreased activity tolerance, Decreased coordination, Decreased strength, Increased fascial restricitons, Dizziness, Impaired flexibility, Postural dysfunction, Pain, Improper body mechanics  Visit Diagnosis: Chronic pain of right knee  Chronic right-sided low back pain without sciatica  Other abnormalities of gait and mobility     Problem List Patient Active Problem List   Diagnosis Date Noted  . Vocal cord paralysis 12/27/2019  . Malnutrition of moderate degree 10/12/2019  . HFrEF (heart failure with reduced ejection fraction) ([place])   . Cardiomyopathy ([place])   . Bacteremia   . AKI (acute kidney injury) ([place]) 10/07/2019  . Chronic back pain 10/07/2019  . Leukocytosis 10/07/2019  . Acute neck pain 05/25/2019  . CKD (chronic kidney disease) stage 3, GFR 30-59 ml/min (HCC) 04/23/2016  . Hearing loss 04/23/2016  . Counseling regarding end of life decision making 04/15/2015  . Essential hypertension, benign 08/22/2012  . Diabetes mellitus with  cardiac complication ([place]) 38/45/3646  . HYPERCHOLESTEROLEMIA 05/12/2009  . History of pheochromocytoma 09/07/2007  . GLAUCOMA 09/07/2007  . History of duodenal ulcer 09/07/2007  . BPH (benign prostatic hyperplasia) 09/07/2007  . OSTEOARTHRITIS 09/07/2007  Durwin Reges DPT Durwin Reges 03/31/2020, 11:13 AM  [facility] [address], [place], [address] Phone: [phone]   Fax:  [phone]  Name: Ranson Belluomini MRN: [id number] Date of Birth: 05/27/1932

## 2020-04-01 ENCOUNTER — Encounter: Payer: Medicare PPO | Admitting: Physical Therapy

## 2020-04-02 ENCOUNTER — Other Ambulatory Visit: Payer: Self-pay

## 2020-04-02 ENCOUNTER — Ambulatory Visit: Payer: Medicare PPO | Admitting: Physical Therapy

## 2020-04-02 ENCOUNTER — Encounter: Payer: Self-pay | Admitting: Physical Therapy

## 2020-04-02 DIAGNOSIS — M545 Low back pain, unspecified: Secondary | ICD-10-CM | POA: Diagnosis not present

## 2020-04-02 DIAGNOSIS — M25561 Pain in right knee: Secondary | ICD-10-CM | POA: Diagnosis not present

## 2020-04-02 DIAGNOSIS — R2689 Other abnormalities of gait and mobility: Secondary | ICD-10-CM

## 2020-04-02 DIAGNOSIS — G8929 Other chronic pain: Secondary | ICD-10-CM | POA: Diagnosis not present

## 2020-04-02 NOTE — Therapy (Signed)
[facility] [address], [place], [address] Phone: [phone]   Fax:  [phone]  Physical Therapy Treatment  Patient Details  Name: Dustin Harrison MRN: [id number] Date of Birth: 1931/12/12 No data recorded  Encounter Date: 04/02/2020   PT End of Session - 04/02/20 1049    Visit Number 9    Number of Visits 17    Date for PT Re-Evaluation 04/25/20    Authorization - Visit Number 9    Authorization - Number of Visits 10    PT Start Time 1033    PT Stop Time 1111    PT Time Calculation (min) 38 min    Activity Tolerance Patient tolerated treatment well    Behavior During Therapy [facility] for tasks assessed/performed           Past Medical History:  Diagnosis Date   Abnormal glucose    Arthritis    Baker's cyst of knee    left   BPH (benign prostatic hypertrophy)    Cough    because of "tight" esophagus   Diabetes mellitus    Glaucoma    HOH (hard of hearing)    Hypertension    Pheochromocytoma of right adrenal gland    Ulcer    Wears hearing aid    bilateral    Past Surgical History:  Procedure Laterality Date   adrenaletomy     CARDIAC CATHETERIZATION     CATARACT EXTRACTION W/PHACO Left 10/08/2015   Procedure: CATARACT EXTRACTION PHACO AND INTRAOCULAR LENS PLACEMENT ([place]) left eye;  Surgeon: Leandrew Koyanagi, MD;  Location: [place];  Service: Ophthalmology;  Laterality: Left;  [place]     RIGHT/LEFT HEART CATH AND CORONARY ANGIOGRAPHY Left 11/28/2019   Procedure: RIGHT/LEFT HEART CATH AND CORONARY ANGIOGRAPHY;  Surgeon: Nelva Bush, MD;  Location: [place] [facility];  Service: Cardiovascular;  Laterality: Left;   SQUAMOUS CELL CARCINOMA EXCISION  03-2006   left ear   TEE WITHOUT CARDIOVERSION N/A 10/12/2019   Procedure: TRANSESOPHAGEAL ECHOCARDIOGRAM (TEE);  Surgeon: Minna Merritts, MD;  Location: [facility];  Service:  Cardiovascular;  Laterality: N/A;   TONSILLECTOMY     XI ROBOTIC LAPAROSCOPIC ASSISTED APPENDECTOMY N/A 10/11/2019   Procedure: XI ROBOTIC ASSISTED LAPAROSCOPIC INSERTION GASTROSTOMY TUBE;  Surgeon: Jules Husbands, MD;  Location: [facility];  Service: General;  Laterality: N/A;    There were no vitals filed for this visit.   Subjective Assessment - 04/02/20 1037    Subjective Patient reports he is feeling good overall. Feels 80% better. PT discussed possible d/c next session, but patient reports he believes PT is good for him, he is making progress.    Pertinent History Pt is a 86 year old male presenting with R sided LBP >30years with R sided knee pain. Reports he used to have sciatica symptoms, does not experience them currently; denies pins/needles, and electrical sesnations. Has increased R sided LBP and R knee pain following stroke May 2020, reports no remaining motor or sensation deficits, and loss of L vocal cords. Reports worst knee pain is 4/10 LBP 3/10; best 0/10 for each. Reports pain in each is a dull ache and they come on seperately. Sleeping on R side, STS transfer, walking aggravates knee pain, reports some sensations that he feels like his knee is going to give way. LBP is aggravated by bending forward and sleeping on his back, he completes "posterior pelvic tilts" regularly which he  reports helps his knee pain. Endorses 1 fall/month d/t dizziness, PCP is following him for this. Patient reports he would like to be able to walk more, limited by pain in R knee and back. Pt drives, completes own shopping by leaning on cart, lives with wife, complets ADLs ind. Pt denies N/V, B&B changes, saddle paresthesia, fever, night sweats, or unrelenting night pain at this time. Pt reports he has lost a lot of weight since stroke, but he is being following for this. Patient is very [place] and has cochlear implant    Limitations Walking;House hold activities;Lifting;Standing    How long can you sit  comfortably? unlimited    How long can you stand comfortably? limited by dizziness only    How long can you walk comfortably? 52mins    Patient Stated Goals walk more without pain    Pain Onset More than a month ago    Pain Onset More than a month ago           Ther-Ex Nustep L77mins; L6 80minsseatand UE setting 11, min cuing to maintain SPM over70 Alt heel taps to 6in cones 2x 12 10# AW on BLE, min cuing for technique with "light tap" unilateral support for safety, increased difficulty with RLE controlled lower OMEGA leg press 20# 3x 10 cuing for eccentric control, increased difficulty with increased wt demand Sidestepping 96ft 2x L and R with min cuing for foot clearance with good carry over; CGA for safety Prone press/cobra stretch3x 15sec  Gait Training 333ft with10# AW on RLE for increased propulsion/foot clearance with success;with 6 cone turns, 8 6in hurdle negotiation x3 laps with cuing for increased speed with good carry  Amb with10# AW13% incline 1.105mph3mins; supervision throughout, cuingmostly forbilat heel strikewith good carry over Vitals monitored throughout session, WNL Prolonged seated rest following needed, normal vitals               PT Education - 04/02/20 1049    Education Details therex form/technique, gait training    Person(s) Educated Patient    Methods Explanation;Demonstration;Verbal cues    Comprehension Verbalized understanding;Returned demonstration;Verbal cues required            PT Short Term Goals - 02/27/20 1151      PT SHORT TERM GOAL #1   Title Pt will be independent with HEP in order to improve strength and decrease back pain in order to improve pain-free function at home and work.    Baseline 02/27/20 HEP given    Time 4    Period Weeks    Status New             PT Long Term Goals - 02/27/20 1152      PT LONG TERM GOAL #1   Title Patient will increase FOTO score to 63 to demonstrate predicted increase in  functional mobility to complete ADLs    Baseline 02/27/20 48    Time 8    Period Weeks    Status New      PT LONG TERM GOAL #2   Title Pt will improve BERG to at least 45/56 in order to demonstrate clinically decreased fall risk cut off score    Baseline 02/27/20 40/56    Time 8    Period Weeks    Status New      PT LONG TERM GOAL #3   Title Pt will decrease 5TSTS by at least 3 seconds in order to demonstrate clinically significant improvement in LE strength  Baseline 02/27/20 19sec    Time 8    Period Weeks    Status New      PT LONG TERM GOAL #4   Title Pt will increase 10MWT to at least 1.94m/s in order to demonstrate decreased fall risk with normalized community ambulation speed    Baseline 02/27/20 Self-selected: s = 0.100m/s ; Fastest: s = 1.18m/s    Time 8    Period Weeks    Status New      PT LONG TERM GOAL #5   Title Pt will decrease worst back pain as reported on NPRS by at least 2 points in order to demonstrate clinically significant reduction in back pain.    Baseline 02/27/20 5/10    Time 8    Period Weeks    Status New                 Plan - 04/02/20 1056    Clinical Impression Statement PT continued progression for dynamic balance with gait training, and BLE strengthening (RLE focus) with success. Pt is able to comply with all cuing for prpoer technique of therex, and complete all planned interventions with appropriate rest. Pt is motivated throughout session without increased pain. PT will continue progression as able.    Personal Factors and Comorbidities Comorbidity 1;Comorbidity 2;Fitness;Comorbidity 3+;Past/Current Experience;Time since onset of injury/illness/exacerbation;Age    Comorbidities HTN, DM, HOH    Examination-Activity Limitations Stairs;Stand;Bed Mobility;Transfers;Squat;Locomotion Level;Lift    Examination-Participation Restrictions Laundry;Cleaning;Community Activity;Meal Prep;Yard Work    [employer]chant [employer] officer  Evolving/Moderate complexity    Clinical Decision Making Moderate    Rehab Potential Good    PT Frequency 2x / week    PT Duration 8 weeks    PT Treatment/Interventions ADLs/Self Care Home Management;Iontophoresis 4mg /ml Dexamethasone;Stair training;Therapeutic exercise;Passive range of motion;Spinal Manipulations;Joint Manipulations;Dry needling;Manual techniques;Patient/family education;Balance training;Neuromuscular re-education;Therapeutic activities;Functional mobility training;Moist Heat;Electrical Stimulation;Cryotherapy;Gait training;Ultrasound;DME Instruction    PT Next Visit Plan glute strengthening, gait strengthening,  balance,    PT Home Exercise Plan bridge, mini squat, glute stretch    Consulted and Agree with Plan of Care Patient           Patient will benefit from skilled therapeutic intervention in order to improve the following deficits and impairments:  Abnormal gait, Decreased balance, Decreased endurance, Decreased mobility, Difficulty walking, Cardiopulmonary status limiting activity, Decreased range of motion, Decreased activity tolerance, Decreased coordination, Decreased strength, Increased fascial restricitons, Dizziness, Impaired flexibility, Postural dysfunction, Pain, Improper body mechanics  Visit Diagnosis: Chronic pain of right knee  Chronic right-sided low back pain without sciatica  Other abnormalities of gait and mobility     Problem List Patient Active Problem List   Diagnosis Date Noted   Vocal cord paralysis 12/27/2019   Malnutrition of moderate degree 10/12/2019   HFrEF (heart failure with reduced ejection fraction) (HCC)    Cardiomyopathy ([place])    Bacteremia    AKI (acute kidney injury) ([place]) 10/07/2019   Chronic back pain 10/07/2019   Leukocytosis 10/07/2019   Acute neck pain 05/25/2019   CKD (chronic kidney disease) stage 3, GFR 30-59 ml/min (HCC) 04/23/2016   Hearing loss 04/23/2016   Counseling regarding end of life  decision making 04/15/2015   Essential hypertension, benign 08/22/2012   Diabetes mellitus with cardiac complication ([place]) 97/07/6376   HYPERCHOLESTEROLEMIA 05/12/2009   History of pheochromocytoma 09/07/2007   GLAUCOMA 09/07/2007   History of duodenal ulcer 09/07/2007   BPH (benign prostatic hyperplasia) 09/07/2007   OSTEOARTHRITIS 09/07/2007   Durwin Reges DPT [facility]  Terance Hart 04/02/2020, 11:06 AM  [place] [facility] [address], [place], [address] Phone: [phone]   Fax:  [phone]  Name: Dustin Harrison MRN: [id number] Date of Birth: Jul 25, 1931

## 2020-04-03 ENCOUNTER — Encounter: Payer: Medicare PPO | Admitting: Physical Therapy

## 2020-04-07 ENCOUNTER — Ambulatory Visit: Payer: Medicare PPO | Attending: Family Medicine | Admitting: Physical Therapy

## 2020-04-07 ENCOUNTER — Other Ambulatory Visit: Payer: Self-pay

## 2020-04-07 ENCOUNTER — Encounter: Payer: Self-pay | Admitting: Physical Therapy

## 2020-04-07 DIAGNOSIS — M545 Low back pain, unspecified: Secondary | ICD-10-CM

## 2020-04-07 DIAGNOSIS — G8929 Other chronic pain: Secondary | ICD-10-CM

## 2020-04-07 DIAGNOSIS — R2689 Other abnormalities of gait and mobility: Secondary | ICD-10-CM | POA: Diagnosis not present

## 2020-04-07 DIAGNOSIS — M25561 Pain in right knee: Secondary | ICD-10-CM | POA: Diagnosis not present

## 2020-04-07 NOTE — Therapy (Signed)
[facility] [address] [place], [address] Phone: [phone]   Fax:  [phone]  Physical Therapy Treatment/Progress Note  Reporting Period 02/27/20 - 04/07/20  Patient Details  Name: Dustin Harrison MRN: [id number] Date of Birth: Apr 24, 1932 No data recorded  Encounter Date: 04/07/2020   PT End of Session - 04/07/20 1220    Visit Number 10    Number of Visits 18    Date for PT Re-Evaluation 04/25/20    Authorization - Visit Number 10    Authorization - Number of Visits 10    PT Start Time 9563    PT Stop Time 1115    PT Time Calculation (min) 45 min    Equipment Utilized During Treatment Gait belt    Activity Tolerance Patient tolerated treatment well    Behavior During Therapy [facility] for tasks assessed/performed           Past Medical History:  Diagnosis Date  . Abnormal glucose   . Arthritis   . Baker's cyst of knee    left  . BPH (benign prostatic hypertrophy)   . Cough    because of "tight" esophagus  . Diabetes mellitus   . Glaucoma   . HOH (hard of hearing)   . Hypertension   . Pheochromocytoma of right adrenal gland   . Ulcer   . Wears hearing aid    bilateral    Past Surgical History:  Procedure Laterality Date  . adrenaletomy    . CARDIAC CATHETERIZATION    . CATARACT EXTRACTION W/PHACO Left 10/08/2015   Procedure: CATARACT EXTRACTION PHACO AND INTRAOCULAR LENS PLACEMENT ([place]) left eye;  Surgeon: Leandrew Koyanagi, MD;  Location: [place];  Service: Ophthalmology;  Laterality: Left;  [place]  . HERNIA REPAIR    . RIGHT/LEFT HEART CATH AND CORONARY ANGIOGRAPHY Left 11/28/2019   Procedure: RIGHT/LEFT HEART CATH AND CORONARY ANGIOGRAPHY;  Surgeon: Nelva Bush, MD;  Location: [place] [facility];  Service: Cardiovascular;  Laterality: Left;  . SQUAMOUS CELL CARCINOMA EXCISION  03-2006   left ear  . TEE WITHOUT CARDIOVERSION N/A 10/12/2019   Procedure:  TRANSESOPHAGEAL ECHOCARDIOGRAM (TEE);  Surgeon: Minna Merritts, MD;  Location: [facility];  Service: Cardiovascular;  Laterality: N/A;  . TONSILLECTOMY    . XI ROBOTIC LAPAROSCOPIC ASSISTED APPENDECTOMY N/A 10/11/2019   Procedure: XI ROBOTIC ASSISTED LAPAROSCOPIC INSERTION GASTROSTOMY TUBE;  Surgeon: Jules Husbands, MD;  Location: [facility];  Service: General;  Laterality: N/A;    There were no vitals filed for this visit.   Subjective Assessment - 04/07/20 1100    Subjective Pt reports no pain in RLE or back. Pt states he is not completeing HEP program but tries to walk every other day at the park for 30-45 mins.    Pertinent History Pt is a 86 year old male presenting with R sided LBP >30years with R sided knee pain. Reports he used to have sciatica symptoms, does not experience them currently; denies pins/needles, and electrical sesnations. Has increased R sided LBP and R knee pain following stroke May 2020, reports no remaining motor or sensation deficits, and loss of L vocal cords. Reports worst knee pain is 4/10 LBP 3/10; best 0/10 for each. Reports pain in each is a dull ache and they come on seperately. Sleeping on R side, STS transfer, walking aggravates knee pain, reports some sensations that he feels like his knee is going to give way. LBP is aggravated by  bending forward and sleeping on his back, he completes "posterior pelvic tilts" regularly which he reports helps his knee pain. Endorses 1 fall/month d/t dizziness, PCP is following him for this. Patient reports he would like to be able to walk more, limited by pain in R knee and back. Pt drives, completes own shopping by leaning on cart, lives with wife, complets ADLs ind. Pt denies N/V, B&B changes, saddle paresthesia, fever, night sweats, or unrelenting night pain at this time. Pt reports he has lost a lot of weight since stroke, but he is being following for this. Patient is very [place] and has cochlear implant    Limitations Walking;House  hold activities;Lifting;Standing    How long can you sit comfortably? unlimited    How long can you stand comfortably? limited by dizziness only    How long can you walk comfortably? 76mins    Patient Stated Goals walk more without pain    Currently in Pain? No/denies           Ther-Ex . Nustep L5 58mins; L6 3mins seat and UE setting 11, min cuing to maintain SPM over 70 . Alt heel taps to 6in cones 2x 12 10# AW on BLE, min cuing for technique with "light tap" unilateral support for safety, increased difficulty with RLE controlled lower o Pt reports that the cones are a bit high to tap  . Sidestepping 72ft 2x L and R with min cuing for foot clearance with good carry over; CGA for safety   Gait Training . 378ft with 10# AW on RLE for increased propulsion/foot clearance with success; with 6 cone turns, 8 6in hurdle negotiation x3 laps with cuing for increased speed with good carry  . Amb with 10# AW 13% incline 1.33mph 80mins; supervision throughout, cuing mostly for bilat heel strike with good carry over o Pt reported mild difficulty and needed additional rest break                    PT Education - 04/07/20 1056    Education Details Therex form and techniques, gait training    Person(s) Educated Patient    Methods Explanation;Demonstration;Verbal cues    Comprehension Verbalized understanding;Returned demonstration;Verbal cues required            PT Short Term Goals - 04/07/20 1347      PT SHORT TERM GOAL #1   Title Pt will be independent with HEP in order to improve strength and decrease back pain in order to improve pain-free function at home and work.    Baseline 02/27/20 HEP given; 04/07/20 completing HEP regularly and walking/water aerobics    Time 4    Period Weeks    Status Achieved             PT Long Term Goals - 04/07/20 1347      PT LONG TERM GOAL #1   Title Patient will increase FOTO score to 63 to demonstrate predicted increase in functional  mobility to complete ADLs    Baseline 02/27/20 48    Time 8    Period Weeks    Status Deferred      PT LONG TERM GOAL #2   Title Pt will improve BERG to at least 45/56 in order to demonstrate clinically decreased fall risk cut off score    Baseline 02/27/20 40/56    Time 8    Status Deferred      PT LONG TERM GOAL #3   Title Pt will  decrease 5TSTS by at least 3 seconds in order to demonstrate clinically significant improvement in LE strength    Baseline 02/27/20 19sec    Time 8    Period Weeks    Status Deferred      PT LONG TERM GOAL #4   Title Pt will increase 10MWT to at least 1.48m/s in order to demonstrate decreased fall risk with normalized community ambulation speed    Baseline 02/27/20 Self-selected: s = 0.39m/s ; Fastest: s = 1.59m/s    Time 8    Period Weeks    Status Deferred      PT LONG TERM GOAL #5   Title Pt will decrease worst back pain as reported on NPRS by at least 2 points in order to demonstrate clinically significant reduction in back pain.    Baseline 02/27/20 5/10    Time 8    Period Weeks    Status Deferred                 Plan - 04/07/20 1210    Clinical Impression Statement Pt continues to show good progression with dynamic and gait balance. Pt progressed today to walk at a slightly faster speed and for an extra minute. Pt is able to complete side stepping with cues to look upright and pick up feet throughout the motion. Patient overall has good improvements and can be progressed to reach goals. Formal goal reassessment deferred until next session    Personal Factors and Comorbidities Comorbidity 1;Comorbidity 2;Fitness;Comorbidity 3+;Past/Current Experience;Time since onset of injury/illness/exacerbation;Age    Comorbidities HTN, DM, HOH    Examination-Activity Limitations Stairs;Stand;Bed Mobility;Transfers;Squat;Locomotion Level;Lift    Examination-Participation Restrictions Laundry;Cleaning;Community Activity;Meal Prep;Yard Work     [employer]chant [employer] officer Evolving/Moderate complexity    Clinical Decision Making Moderate    Rehab Potential Good    PT Frequency 2x / week    PT Duration 8 weeks    PT Treatment/Interventions ADLs/Self Care Home Management;Iontophoresis 4mg /ml Dexamethasone;Stair training;Therapeutic exercise;Passive range of motion;Spinal Manipulations;Joint Manipulations;Dry needling;Manual techniques;Patient/family education;Balance training;Neuromuscular re-education;Therapeutic activities;Functional mobility training;Moist Heat;Electrical Stimulation;Cryotherapy;Gait training;Ultrasound;DME Instruction    PT Next Visit Plan REASSESS    PT Home Exercise Plan bridge, mini squat, glute stretch    Consulted and Agree with Plan of Care Patient           Patient will benefit from skilled therapeutic intervention in order to improve the following deficits and impairments:  Abnormal gait, Decreased balance, Decreased endurance, Decreased mobility, Difficulty walking, Cardiopulmonary status limiting activity, Decreased range of motion, Decreased activity tolerance, Decreased coordination, Decreased strength, Increased fascial restricitons, Dizziness, Impaired flexibility, Postural dysfunction, Pain, Improper body mechanics  Visit Diagnosis: Chronic pain of right knee  Chronic right-sided low back pain without sciatica  Other abnormalities of gait and mobility     Problem List Patient Active Problem List   Diagnosis Date Noted  . Vocal cord paralysis 12/27/2019  . Malnutrition of moderate degree 10/12/2019  . HFrEF (heart failure with reduced ejection fraction) ([place])   . Cardiomyopathy ([place])   . Bacteremia   . AKI (acute kidney injury) ([place]) 10/07/2019  . Chronic back pain 10/07/2019  . Leukocytosis 10/07/2019  . Acute neck pain 05/25/2019  . CKD (chronic kidney disease) stage 3, GFR 30-59 ml/min (HCC) 04/23/2016  . Hearing loss 04/23/2016  . Counseling regarding end of life decision  making 04/15/2015  . Essential hypertension, benign 08/22/2012  . Diabetes mellitus with cardiac complication ([place]) 75/64/3329  . HYPERCHOLESTEROLEMIA 05/12/2009  . History of pheochromocytoma 09/07/2007  .  GLAUCOMA 09/07/2007  . History of duodenal ulcer 09/07/2007  . BPH (benign prostatic hyperplasia) 09/07/2007  . OSTEOARTHRITIS 09/07/2007   Durwin Reges DPT Durwin Reges 04/07/2020, 1:52 PM  [facility] [address], [place], [address] Phone: [phone]   Fax:  [phone]  Name: Dustin Harrison MRN: [id number] Date of Birth: [birth year]

## 2020-04-09 ENCOUNTER — Ambulatory Visit: Payer: Medicare PPO | Admitting: Physical Therapy

## 2020-04-09 ENCOUNTER — Other Ambulatory Visit: Payer: Self-pay

## 2020-04-09 ENCOUNTER — Encounter: Payer: Self-pay | Admitting: Physical Therapy

## 2020-04-09 DIAGNOSIS — R2689 Other abnormalities of gait and mobility: Secondary | ICD-10-CM | POA: Diagnosis not present

## 2020-04-09 DIAGNOSIS — G8929 Other chronic pain: Secondary | ICD-10-CM

## 2020-04-09 DIAGNOSIS — M25561 Pain in right knee: Secondary | ICD-10-CM | POA: Diagnosis not present

## 2020-04-09 DIAGNOSIS — M545 Low back pain, unspecified: Secondary | ICD-10-CM | POA: Diagnosis not present

## 2020-04-09 NOTE — Therapy (Signed)
[facility] [address], [place], [address] Phone: [phone]   Fax:  [phone]  Physical Therapy Treatment/ Progress Report  Reporting  Period 02/27/20-04/09/20  Patient Details  Name: Dustin Harrison MRN: [id number] Date of Birth: 1932-03-29 No data recorded  Encounter Date: 04/09/2020   PT End of Session - 04/09/20 1102    Visit Number 11    Number of Visits 18    Date for PT Re-Evaluation 05/16/20    Authorization - Visit Number 1    Authorization - Number of Visits 10    PT Start Time 1000    PT Stop Time 1045    PT Time Calculation (min) 45 min    Equipment Utilized During Treatment Gait belt    Activity Tolerance Patient tolerated treatment well    Behavior During Therapy WFL for tasks assessed/performed           Past Medical History:  Diagnosis Date  . Abnormal glucose   . Arthritis   . Baker's cyst of knee    left  . BPH (benign prostatic hypertrophy)   . Cough    because of "tight" esophagus  . Diabetes mellitus   . Glaucoma   . HOH (hard of hearing)   . Hypertension   . Pheochromocytoma of right adrenal gland   . Ulcer   . Wears hearing aid    bilateral    Past Surgical History:  Procedure Laterality Date  . adrenaletomy    . CARDIAC CATHETERIZATION    . CATARACT EXTRACTION W/PHACO Left 10/08/2015   Procedure: CATARACT EXTRACTION PHACO AND INTRAOCULAR LENS PLACEMENT ([place]) left eye;  Surgeon: Leandrew Koyanagi, MD;  Location: [facility];  Service: Ophthalmology;  Laterality: Left;  MALYUGIN  . HERNIA REPAIR    . RIGHT/LEFT HEART CATH AND CORONARY ANGIOGRAPHY Left 11/28/2019   Procedure: RIGHT/LEFT HEART CATH AND CORONARY ANGIOGRAPHY;  Surgeon: Nelva Bush, MD;  Location: [facility];  Service: Cardiovascular;  Laterality: Left;  . SQUAMOUS CELL CARCINOMA EXCISION  03-2006   left ear  . TEE WITHOUT CARDIOVERSION N/A 10/12/2019   Procedure:  TRANSESOPHAGEAL ECHOCARDIOGRAM (TEE);  Surgeon: Minna Merritts, MD;  Location: [facility];  Service: Cardiovascular;  Laterality: N/A;  . TONSILLECTOMY    . XI ROBOTIC LAPAROSCOPIC ASSISTED APPENDECTOMY N/A 10/11/2019   Procedure: XI ROBOTIC ASSISTED LAPAROSCOPIC INSERTION GASTROSTOMY TUBE;  Surgeon: Jules Husbands, MD;  Location: [facility];  Service: General;  Laterality: N/A;    There were no vitals filed for this visit.   Subjective Assessment - 04/09/20 1000    Subjective Pt reports no pain in RLE or back. Pt states he swims in the pool every other day he is not in therapy.    Pertinent History Pt is a 86 year old male presenting with R sided LBP >30years with R sided knee pain. Reports he used to have sciatica symptoms, does not experience them currently; denies pins/needles, and electrical sesnations. Has increased R sided LBP and R knee pain following stroke May 2020, reports no remaining motor or sensation deficits, and loss of L vocal cords. Reports worst knee pain is 4/10 LBP 3/10; best 0/10 for each. Reports pain in each is a dull ache and they come on seperately. Sleeping on R side, STS transfer, walking aggravates knee pain, reports some sensations that he feels like his knee is going to give way. LBP is aggravated by bending forward and sleeping on his  back, he completes "posterior pelvic tilts" regularly which he reports helps his knee pain. Endorses 1 fall/month d/t dizziness, PCP is following him for this. Patient reports he would like to be able to walk more, limited by pain in R knee and back. Pt drives, completes own shopping by leaning on cart, lives with wife, complets ADLs ind. Pt denies N/V, B&B changes, saddle paresthesia, fever, night sweats, or unrelenting night pain at this time. Pt reports he has lost a lot of weight since stroke, but he is being following for this. Patient is very [place] and has cochlear implant    Limitations Walking;House hold activities;Lifting;Standing    How  long can you sit comfortably? unlimited    How long can you stand comfortably? limited by dizziness only    How long can you walk comfortably? 43mins    Patient Stated Goals walk more without pain    Currently in Pain? No/denies    Pain Score 0-No pain    Pain Onset More than a month ago    Pain Onset More than a month ago               Ther-Ex Nustep L5 62mins; L6 25mins seat and UE setting 11, min cuing to maintain SPM over 70 SLS 2x30 BLE w/2 finger support Mini squats 2x10  - cues to go lower and keep hips pushed back  Lateral band walks 2x28ft w/green band  -cues to pick up feet and take big steps   PT reviewed the following HEP with patient with patient able to demonstrate a set of the following with min cuing for correction needed. PT educated patient on parameters of therex (how/when to inc/decrease intensity, frequency, rep/set range, stretch hold time, and purpose of therex) with verbalized understanding.  Access Code: [email or web address] Side Stepping with Resistance at Ankles - 1 x daily - 7 x weekly - 3 sets - 12 reps Mini Squat with Counter Support - 1 x daily - 7 x weekly - 3 sets - 10 reps Standing Single Leg Stance with Counter Support - 1 x daily - 7 x weekly - 3 sets - 10 reps   Reassessment  BERG - completed all balance assessments with CGA for safety, rest breaks as needed, good carry over of demo for tests/measures 5TSTS 10MWT Updated goals                              PT Education - 04/09/20 1101    Education Details therex form/technique; re-assess    Person(s) Educated Patient    Methods Explanation;Demonstration;Tactile cues    Comprehension Verbalized understanding;Returned demonstration;Verbal cues required            PT Short Term Goals - 04/09/20 1051      PT SHORT TERM GOAL #1   Title Pt will be independent with HEP in order to improve strength and decrease back pain in order to improve pain-free function at home and work.     Baseline 04/09/20- updated HEP    Time 4    Period Weeks    Status Revised             PT Long Term Goals - 04/09/20 1007      PT LONG TERM GOAL #1   Title Patient will increase FOTO score to 63 to demonstrate predicted increase in functional mobility to complete ADLs    Baseline 02/27/20 48    Time 8  Period Weeks    Status Deferred      PT LONG TERM GOAL #2   Title Pt will improve BERG to at least 45/56 in order to demonstrate clinically decreased fall risk cut off score    Baseline 02/27/20 40/56 04/09/20 51/56    Time 8    Period Weeks    Status Achieved      PT LONG TERM GOAL #3   Title Pt will perform 5TSTS in 15secs or less  in order to demonstrate age matched LE norms and strength    Baseline 04/09/20 10sec with use of UE    Time 8    Period Weeks    Status Revised      PT LONG TERM GOAL #4   Title Pt will increase 10MWT to at least 1.48m/s in order to demonstrate decreased fall risk with normalized community ambulation speed    Baseline 02/27/20 Self-selected: s = 0.62m/s ; Fastest: s = 1.103m/s 04/09/20 s= 1.53m/s    Time 8    Period Weeks    Status Achieved      PT LONG TERM GOAL #5   Title Pt will decrease worst back pain as reported on NPRS by at least 2 points in order to demonstrate clinically significant reduction in back pain.    Baseline 02/27/20 5/10  04/10/11: 0/10    Time 8    Period Weeks    Status Achieved      Additional Long Term Goals   Additional Long Term Goals Yes      PT LONG TERM GOAL #6   Title Pt will be able to demonstrate sit to stand transfer without using UE to increase increase independence and safety in community and home    Baseline 04/09/20 use of UE from arm rest    Time 8    Period Weeks    Status New                 Plan - 04/09/20 1052    Clinical Impression Statement Pt completed reassessment and showed great improvements in balance and LE strength. Pt increased BERG and 10MWT test scores to be that of a  community ambulating older adult and decreased fall risk. Pt acheived goals and PT updated ones to work on as well as new HEP program to work on. Pt with continued strength deficits, resulting in decreased ind with basic transfers. PT will progress as needed.    Personal Factors and Comorbidities Comorbidity 1;Comorbidity 2;Fitness;Comorbidity 3+;Past/Current Experience;Time since onset of injury/illness/exacerbation;Age    Comorbidities HTN, DM, HOH    Examination-Activity Limitations Stairs;Stand;Bed Mobility;Transfers;Squat;Locomotion Level;Lift    Examination-Participation Restrictions Laundry;Cleaning;Community Activity;Meal Prep;Yard Work    Stability/Clinical Decision Making Stable/Uncomplicated    Clinical Decision Making Moderate    Rehab Potential Good    PT Frequency 2x / week    PT Duration 4 weeks    PT Treatment/Interventions ADLs/Self Care Home Management;Iontophoresis 4mg /ml Dexamethasone;Stair training;Therapeutic exercise;Passive range of motion;Spinal Manipulations;Joint Manipulations;Dry needling;Manual techniques;Patient/family education;Balance training;Neuromuscular re-education;Therapeutic activities;Functional mobility training;Moist Heat;Electrical Stimulation;Cryotherapy;Gait training;Ultrasound;DME Instruction    PT Next Visit Plan progress with exercises as tolerated    PT Home Exercise Plan SLS, mini squats, lateral band walks w/green theraband    Consulted and Agree with Plan of Care Patient           Patient will benefit from skilled therapeutic intervention in order to improve the following deficits and impairments:  Abnormal gait, Decreased balance, Decreased endurance, Decreased mobility, Difficulty walking, Cardiopulmonary  status limiting activity, Decreased range of motion, Decreased activity tolerance, Decreased coordination, Decreased strength, Increased fascial restricitons, Dizziness, Impaired flexibility, Postural dysfunction, Pain, Improper body  mechanics  Visit Diagnosis: Chronic pain of right knee  Chronic right-sided low back pain without sciatica  Other abnormalities of gait and mobility     Problem List Patient Active Problem List   Diagnosis Date Noted  . Vocal cord paralysis 12/27/2019  . Malnutrition of moderate degree 10/12/2019  . HFrEF (heart failure with reduced ejection fraction) ([place])   . Cardiomyopathy ([place])   . Bacteremia   . AKI (acute kidney injury) ([place]) 10/07/2019  . Chronic back pain 10/07/2019  . Leukocytosis 10/07/2019  . Acute neck pain 05/25/2019  . CKD (chronic kidney disease) stage 3, GFR 30-59 ml/min ([facility]) 04/23/2016  . Hearing loss 04/23/2016  . Counseling regarding end of life decision making 04/15/2015  . Essential hypertension, benign 08/22/2012  . Diabetes mellitus with cardiac complication ([place]) 32/95/1884  . HYPERCHOLESTEROLEMIA 05/12/2009  . History of pheochromocytoma 09/07/2007  . GLAUCOMA 09/07/2007  . History of duodenal ulcer 09/07/2007  . BPH (benign prostatic hyperplasia) 09/07/2007  . OSTEOARTHRITIS 09/07/2007   Durwin Reges DPT [facility], SPT  Vikki Ports Terance Hart 04/09/2020, 1:19 PM  [facility] [address] [place], [address] Phone: [phone]   Fax:  [phone]  Name: Jac Romulus MRN: [id number] Date of Birth: 07-14-1931

## 2020-04-14 ENCOUNTER — Ambulatory Visit: Payer: Medicare PPO | Admitting: Physical Therapy

## 2020-04-14 ENCOUNTER — Encounter: Payer: Self-pay | Admitting: Physical Therapy

## 2020-04-14 ENCOUNTER — Other Ambulatory Visit: Payer: Self-pay

## 2020-04-14 DIAGNOSIS — M25561 Pain in right knee: Secondary | ICD-10-CM | POA: Diagnosis not present

## 2020-04-14 DIAGNOSIS — G8929 Other chronic pain: Secondary | ICD-10-CM

## 2020-04-14 DIAGNOSIS — M545 Low back pain, unspecified: Secondary | ICD-10-CM

## 2020-04-14 DIAGNOSIS — R2689 Other abnormalities of gait and mobility: Secondary | ICD-10-CM | POA: Diagnosis not present

## 2020-04-14 NOTE — Therapy (Signed)
[facility] [address] [place], [address] Phone: [phone]   Fax:  [phone]  Physical Therapy Treatment  Patient Details  Name: Dustin Harrison MRN: [id number] Date of Birth: [birth year] No data recorded  Encounter Date: 04/14/2020   PT End of Session - 04/14/20 1205    Visit Number 12    Number of Visits 18    Date for PT Re-Evaluation 05/16/20    Authorization - Visit Number 2    Authorization - Number of Visits 10    PT Start Time 7517    PT Stop Time 1204    PT Time Calculation (min) 38 min    Equipment Utilized During Treatment Gait belt    Activity Tolerance Patient tolerated treatment well    Behavior During Therapy [facility] for tasks assessed/performed           Past Medical History:  Diagnosis Date  . Abnormal glucose   . Arthritis   . Baker's cyst of knee    left  . BPH (benign prostatic hypertrophy)   . Cough    because of "tight" esophagus  . Diabetes mellitus   . Glaucoma   . HOH (hard of hearing)   . Hypertension   . Pheochromocytoma of right adrenal gland   . Ulcer   . Wears hearing aid    bilateral    Past Surgical History:  Procedure Laterality Date  . adrenaletomy    . CARDIAC CATHETERIZATION    . CATARACT EXTRACTION W/PHACO Left 10/08/2015   Procedure: CATARACT EXTRACTION PHACO AND INTRAOCULAR LENS PLACEMENT ([place]) left eye;  Surgeon: Leandrew Koyanagi, MD;  Location: [facility];  Service: Ophthalmology;  Laterality: Left;  MALYUGIN  . HERNIA REPAIR    . RIGHT/LEFT HEART CATH AND CORONARY ANGIOGRAPHY Left 11/28/2019   Procedure: RIGHT/LEFT HEART CATH AND CORONARY ANGIOGRAPHY;  Surgeon: Nelva Bush, MD;  Location: [facility];  Service: Cardiovascular;  Laterality: Left;  . SQUAMOUS CELL CARCINOMA EXCISION  03-2006   left ear  . TEE WITHOUT CARDIOVERSION N/A 10/12/2019   Procedure: TRANSESOPHAGEAL ECHOCARDIOGRAM (TEE);  Surgeon: Minna Merritts, MD;  Location: [facility];  Service: Cardiovascular;  Laterality: N/A;  . TONSILLECTOMY    . XI ROBOTIC LAPAROSCOPIC ASSISTED APPENDECTOMY N/A 10/11/2019   Procedure: XI ROBOTIC ASSISTED LAPAROSCOPIC INSERTION GASTROSTOMY TUBE;  Surgeon: Jules Husbands, MD;  Location: [facility];  Service: General;  Laterality: N/A;    There were no vitals filed for this visit.   Subjective Assessment - 04/14/20 1128    Subjective Pt reports no pain in R knee or back today. Pt states he went walking in the park for 30 mins over the weekend without difficulty.    Pertinent History Pt is a 86 year old male presenting with R sided LBP >30years with R sided knee pain. Reports he used to have sciatica symptoms, does not experience them currently; denies pins/needles, and electrical sesnations. Has increased R sided LBP and R knee pain following stroke May 2020, reports no remaining motor or sensation deficits, and loss of L vocal cords. Reports worst knee pain is 4/10 LBP 3/10; best 0/10 for each. Reports pain in each is a dull ache and they come on seperately. Sleeping on R side, STS transfer, walking aggravates knee pain, reports some sensations that he feels like his knee is going to give way. LBP is aggravated by bending forward and sleeping on his back, he completes "posterior  pelvic tilts" regularly which he reports helps his knee pain. Endorses 1 fall/month d/t dizziness, PCP is following him for this. Patient reports he would like to be able to walk more, limited by pain in R knee and back. Pt drives, completes own shopping by leaning on cart, lives with wife, complets ADLs ind. Pt denies N/V, B&B changes, saddle paresthesia, fever, night sweats, or unrelenting night pain at this time. Pt reports he has lost a lot of weight since stroke, but he is being following for this. Patient is very [place] and has cochlear implant    Limitations Walking;House hold activities;Lifting;Standing    How long can you sit comfortably?  unlimited    How long can you stand comfortably? limited by dizziness only    How long can you walk comfortably? 61mins    Patient Stated Goals walk more without pain    Currently in Pain? No/denies    Pain Score 0-No pain    Pain Onset More than a month ago    Pain Onset More than a month ago                Ther-Ex-  Nustep L5 39mins; L6 18mins seat and UE setting 11, min cuing to maintain SPM over 70  SLS 2x30 BLE w/2 finger support -hard on RLE, unable to do without 2 finger support   Mini squats 2x10  - cues to go lower and keep hips pushed back   Lateral band walks 2x42ft w/green band  -cues to pick up feet and take big steps     Treadmill walking 21min with 10# AW 10% incline 1.2 speed to increase walking tolerance   Heel taps on cone with 10# AW for RLE strengthening   Prone cobra stretch 2x30secs                   PT Education - 04/14/20 1205    Education Details therex form/technique    Person(s) Educated Patient    Methods Explanation;Demonstration;Verbal cues    Comprehension Returned demonstration;Verbal cues required            PT Short Term Goals - 04/09/20 1051      PT SHORT TERM GOAL #1   Title Pt will be independent with HEP in order to improve strength and decrease back pain in order to improve pain-free function at home and work.    Baseline 04/09/20- updated HEP    Time 4    Period Weeks    Status Revised             PT Long Term Goals - 04/09/20 1007      PT LONG TERM GOAL #1   Title Patient will increase FOTO score to 63 to demonstrate predicted increase in functional mobility to complete ADLs    Baseline 02/27/20 48    Time 8    Period Weeks    Status Deferred      PT LONG TERM GOAL #2   Title Pt will improve BERG to at least 45/56 in order to demonstrate clinically decreased fall risk cut off score    Baseline 02/27/20 40/56 04/09/20 51/56    Time 8    Period Weeks    Status Achieved      PT LONG TERM GOAL #3    Title Pt will perform 5TSTS in 15secs or less  in order to demonstrate age matched LE norms and strength    Baseline 04/09/20 10sec with use of UE  Time 8    Period Weeks    Status Revised      PT LONG TERM GOAL #4   Title Pt will increase 10MWT to at least 1.31m/s in order to demonstrate decreased fall risk with normalized community ambulation speed    Baseline 02/27/20 Self-selected: s = 0.93m/s ; Fastest: s = 1.65m/s 04/09/20 s= 1.44m/s    Time 8    Period Weeks    Status Achieved      PT LONG TERM GOAL #5   Title Pt will decrease worst back pain as reported on NPRS by at least 2 points in order to demonstrate clinically significant reduction in back pain.    Baseline 02/27/20 5/10  04/10/11: 0/10    Time 8    Period Weeks    Status Achieved      Additional Long Term Goals   Additional Long Term Goals Yes      PT LONG TERM GOAL #6   Title Pt will be able to demonstrate sit to stand transfer without using UE to increase increase independence and safety in community and home    Baseline 04/09/20 use of UE from arm rest    Time 8    Period Weeks    Status New                 Plan - 04/14/20 1206    Clinical Impression Statement Pt completed review of HEP given at reassessment last visit. Patient was able to demonstrate new HEP with minimal cues and stated he was doing 2 of 3 exercises given to him. Patient was not working on SLS at home, so worked on in the clinic. Pt had difficulty with SLS on RLE. Focused on RLE strengthing with 10# AW on treadmill and heel taps to increase strength and enduracne for walking. PT will progress as able.    Personal Factors and Comorbidities Comorbidity 1;Comorbidity 2;Fitness;Comorbidity 3+;Past/Current Experience;Time since onset of injury/illness/exacerbation;Age    Comorbidities HTN, DM, HOH    Examination-Activity Limitations Stairs;Stand;Bed Mobility;Transfers;Squat;Locomotion Level;Lift    Examination-Participation Restrictions  Laundry;Cleaning;Community Activity;Meal Prep;Yard Work    Stability/Clinical Decision Making Stable/Uncomplicated    Clinical Decision Making Moderate    Rehab Potential Good    PT Frequency 2x / week    PT Duration 4 weeks    PT Treatment/Interventions ADLs/Self Care Home Management;Iontophoresis 4mg /ml Dexamethasone;Stair training;Therapeutic exercise;Passive range of motion;Spinal Manipulations;Joint Manipulations;Dry needling;Manual techniques;Patient/family education;Balance training;Neuromuscular re-education;Therapeutic activities;Functional mobility training;Moist Heat;Electrical Stimulation;Cryotherapy;Gait training;Ultrasound;DME Instruction    PT Next Visit Plan progress with exercises as tolerated    PT Home Exercise Plan SLS, mini squats, lateral band walks w/green theraband    Consulted and Agree with Plan of Care Patient           Patient will benefit from skilled therapeutic intervention in order to improve the following deficits and impairments:  Abnormal gait, Decreased balance, Decreased endurance, Decreased mobility, Difficulty walking, Cardiopulmonary status limiting activity, Decreased range of motion, Decreased activity tolerance, Decreased coordination, Decreased strength, Increased fascial restricitons, Dizziness, Impaired flexibility, Postural dysfunction, Pain, Improper body mechanics  Visit Diagnosis: Chronic pain of right knee  Chronic right-sided low back pain without sciatica  Other abnormalities of gait and mobility     Problem List Patient Active Problem List   Diagnosis Date Noted  . Vocal cord paralysis 12/27/2019  . Malnutrition of moderate degree 10/12/2019  . HFrEF (heart failure with reduced ejection fraction) ([place])   . Cardiomyopathy ([place])   . Bacteremia   .  AKI (acute kidney injury) ([place]) 10/07/2019  . Chronic back pain 10/07/2019  . Leukocytosis 10/07/2019  . Acute neck pain 05/25/2019  . CKD (chronic kidney disease) stage 3, GFR 30-59  ml/min ([facility]) 04/23/2016  . Hearing loss 04/23/2016  . Counseling regarding end of life decision making 04/15/2015  . Essential hypertension, benign 08/22/2012  . Diabetes mellitus with cardiac complication ([place]) 61/53/7943  . HYPERCHOLESTEROLEMIA 05/12/2009  . History of pheochromocytoma 09/07/2007  . GLAUCOMA 09/07/2007  . History of duodenal ulcer 09/07/2007  . BPH (benign prostatic hyperplasia) 09/07/2007  . OSTEOARTHRITIS 09/07/2007     Durwin Reges DPT [facility], SPT  Durwin Reges 04/14/2020, 2:04 PM  [facility] [address] [place], [address] Phone: [phone]   Fax:  [phone]  Name: Dustin Harrison MRN: [id number] Date of Birth: [birth year]

## 2020-04-23 ENCOUNTER — Other Ambulatory Visit: Payer: Self-pay

## 2020-04-23 ENCOUNTER — Ambulatory Visit: Payer: Medicare PPO | Admitting: Physical Therapy

## 2020-04-23 ENCOUNTER — Encounter: Payer: Self-pay | Admitting: Physical Therapy

## 2020-04-23 DIAGNOSIS — G8929 Other chronic pain: Secondary | ICD-10-CM | POA: Diagnosis not present

## 2020-04-23 DIAGNOSIS — M545 Low back pain, unspecified: Secondary | ICD-10-CM | POA: Diagnosis not present

## 2020-04-23 DIAGNOSIS — R2689 Other abnormalities of gait and mobility: Secondary | ICD-10-CM

## 2020-04-23 DIAGNOSIS — M25561 Pain in right knee: Secondary | ICD-10-CM | POA: Diagnosis not present

## 2020-04-23 NOTE — Therapy (Signed)
[place] [facility] [address] [place], [address] Phone: [phone]   Fax:  [phone]  Physical Therapy Treatment  Patient Details  Name: Dustin Harrison MRN: [id number] Date of Birth: 1931/09/04 No data recorded  Encounter Date: 04/23/2020   PT End of Session - 04/23/20 0950    Visit Number 13    Number of Visits 18    Date for PT Re-Evaluation 05/16/20    Authorization - Visit Number 3    Authorization - Number of Visits 10    PT Start Time 0900    PT Stop Time 0941    PT Time Calculation (min) 41 min    Equipment Utilized During Treatment Gait belt    Activity Tolerance Patient tolerated treatment well    Behavior During Therapy [facility] for tasks assessed/performed           Past Medical History:  Diagnosis Date  . Abnormal glucose   . Arthritis   . Baker's cyst of knee    left  . BPH (benign prostatic hypertrophy)   . Cough    because of "tight" esophagus  . Diabetes mellitus   . Glaucoma   . HOH (hard of hearing)   . Hypertension   . Pheochromocytoma of right adrenal gland   . Ulcer   . Wears hearing aid    bilateral    Past Surgical History:  Procedure Laterality Date  . adrenaletomy    . CARDIAC CATHETERIZATION    . CATARACT EXTRACTION W/PHACO Left 10/08/2015   Procedure: CATARACT EXTRACTION PHACO AND INTRAOCULAR LENS PLACEMENT ([place]) left eye;  Surgeon: Leandrew Koyanagi, MD;  Location: [place];  Service: Ophthalmology;  Laterality: Left;  MALYUGIN  . HERNIA REPAIR    . RIGHT/LEFT HEART CATH AND CORONARY ANGIOGRAPHY Left 11/28/2019   Procedure: RIGHT/LEFT HEART CATH AND CORONARY ANGIOGRAPHY;  Surgeon: Nelva Bush, MD;  Location: [place] [facility];  Service: Cardiovascular;  Laterality: Left;  . SQUAMOUS CELL CARCINOMA EXCISION  03-2006   left ear  . TEE WITHOUT CARDIOVERSION N/A 10/12/2019   Procedure: TRANSESOPHAGEAL ECHOCARDIOGRAM (TEE);  Surgeon: Minna Merritts, MD;  Location: [facility];  Service: Cardiovascular;  Laterality: N/A;  . TONSILLECTOMY    . XI ROBOTIC LAPAROSCOPIC ASSISTED APPENDECTOMY N/A 10/11/2019   Procedure: XI ROBOTIC ASSISTED LAPAROSCOPIC INSERTION GASTROSTOMY TUBE;  Surgeon: Jules Husbands, MD;  Location: [facility];  Service: General;  Laterality: N/A;    There were no vitals filed for this visit.   Subjective Assessment - 04/23/20 0902    Subjective Patient reports no pain in R knee or back and he is still walking for at least 30 mins a day. Pt states he was able to swim for 1hr yesterday at [employer] class.    Pertinent History Pt is a 86 year old male presenting with R sided LBP >30years with R sided knee pain. Reports he used to have sciatica symptoms, does not experience them currently; denies pins/needles, and electrical sesnations. Has increased R sided LBP and R knee pain following stroke May 2020, reports no remaining motor or sensation deficits, and loss of L vocal cords. Reports worst knee pain is 4/10 LBP 3/10; best 0/10 for each. Reports pain in each is a dull ache and they come on seperately. Sleeping on R side, STS transfer, walking aggravates knee pain, reports some sensations that he feels like his knee is going to give way. LBP is aggravated by bending forward  and sleeping on his back, he completes "posterior pelvic tilts" regularly which he reports helps his knee pain. Endorses 1 fall/month d/t dizziness, PCP is following him for this. Patient reports he would like to be able to walk more, limited by pain in R knee and back. Pt drives, completes own shopping by leaning on cart, lives with wife, complets ADLs ind. Pt denies N/V, B&B changes, saddle paresthesia, fever, night sweats, or unrelenting night pain at this time. Pt reports he has lost a lot of weight since stroke, but he is being following for this. Patient is very [place] and has cochlear implant    Limitations Walking;House hold activities;Lifting;Standing    How long  can you sit comfortably? unlimited    How long can you stand comfortably? limited by dizziness only    How long can you walk comfortably? 43mins    Patient Stated Goals walk more without pain    Currently in Pain? No/denies    Pain Score 0-No pain    Pain Onset More than a month ago    Pain Onset More than a month ago           Ther-Ex Nustep L5 54mins; L6 32mins seat and UE setting 11, min cuing to maintain SPM over 70    Stand with overhand press 5# dumbells 6x3   Leg press 75# 6x3    Prone cobra stretch 2x30secs    Gait training  10# AW stepping over hurdles and lateral walking over hurdle, took off AW for lateral step over d/t pt difficulty 3x81ft                            PT Education - 04/23/20 0950    Education Details therex form/technique    Person(s) Educated Patient    Methods Explanation;Demonstration;Verbal cues    Comprehension Verbalized understanding;Returned demonstration;Verbal cues required            PT Short Term Goals - 04/09/20 1051      PT SHORT TERM GOAL #1   Title Pt will be independent with HEP in order to improve strength and decrease back pain in order to improve pain-free function at home and work.    Baseline 04/09/20- updated HEP    Time 4    Period Weeks    Status Revised             PT Long Term Goals - 04/09/20 1007      PT LONG TERM GOAL #1   Title Patient will increase FOTO score to 63 to demonstrate predicted increase in functional mobility to complete ADLs    Baseline 02/27/20 48    Time 8    Period Weeks    Status Deferred      PT LONG TERM GOAL #2   Title Pt will improve BERG to at least 45/56 in order to demonstrate clinically decreased fall risk cut off score    Baseline 02/27/20 40/56 04/09/20 51/56    Time 8    Period Weeks    Status Achieved      PT LONG TERM GOAL #3   Title Pt will perform 5TSTS in 15secs or less  in order to demonstrate age matched LE norms and strength    Baseline  04/09/20 10sec with use of UE    Time 8    Period Weeks    Status Revised      PT LONG TERM GOAL #4  Title Pt will increase 10MWT to at least 1.73m/s in order to demonstrate decreased fall risk with normalized community ambulation speed    Baseline 02/27/20 Self-selected: s = 0.50m/s ; Fastest: s = 1.12m/s 04/09/20 s= 1.72m/s    Time 8    Period Weeks    Status Achieved      PT LONG TERM GOAL #5   Title Pt will decrease worst back pain as reported on NPRS by at least 2 points in order to demonstrate clinically significant reduction in back pain.    Baseline 02/27/20 5/10  04/10/11: 0/10    Time 8    Period Weeks    Status Achieved      Additional Long Term Goals   Additional Long Term Goals Yes      PT LONG TERM GOAL #6   Title Pt will be able to demonstrate sit to stand transfer without using UE to increase increase independence and safety in community and home    Baseline 04/09/20 use of UE from arm rest    Time 8    Period Weeks    Status New                 Plan - 04/23/20 0951    Clinical Impression Statement Pt continues to show good improvements with overall function and mobility with no back or knee pain. PT session focused on increasing power in BLE working on STS with overhead press and leg press. Pt able to complete therex progression with rest breaks between sets. Pt able to ambulate longer distance while stepping over obstacles with min CGA. Pt has mild LOB with lateral hurdle step overs but able to control after given cues to take large steps and slower lower feet without stomping down. PT will progress as able.    Personal Factors and Comorbidities Comorbidity 1;Comorbidity 2;Fitness;Comorbidity 3+;Past/Current Experience;Time since onset of injury/illness/exacerbation;Age    Comorbidities HTN, DM, HOH    Examination-Activity Limitations Stairs;Stand;Bed Mobility;Transfers;Squat;Locomotion Level;Lift    Stability/Clinical Decision Making Stable/Uncomplicated     Clinical Decision Making Moderate    Rehab Potential Good    PT Frequency 2x / week    PT Duration 4 weeks    PT Treatment/Interventions ADLs/Self Care Home Management;Iontophoresis 4mg /ml Dexamethasone;Stair training;Therapeutic exercise;Passive range of motion;Spinal Manipulations;Joint Manipulations;Dry needling;Manual techniques;Patient/family education;Balance training;Neuromuscular re-education;Therapeutic activities;Functional mobility training;Moist Heat;Electrical Stimulation;Cryotherapy;Gait training;Ultrasound;DME Instruction    PT Next Visit Plan progress with exercises as tolerated    PT Home Exercise Plan SLS, mini squats, lateral band walks w/green theraband    Consulted and Agree with Plan of Care Patient           Patient will benefit from skilled therapeutic intervention in order to improve the following deficits and impairments:  Abnormal gait, Decreased balance, Decreased endurance, Decreased mobility, Difficulty walking, Cardiopulmonary status limiting activity, Decreased range of motion, Decreased activity tolerance, Decreased coordination, Decreased strength, Increased fascial restricitons, Dizziness, Impaired flexibility, Postural dysfunction, Pain, Improper body mechanics  Visit Diagnosis: Other abnormalities of gait and mobility  Chronic pain of right knee  Chronic right-sided low back pain without sciatica     Problem List Patient Active Problem List   Diagnosis Date Noted  . Vocal cord paralysis 12/27/2019  . Malnutrition of moderate degree 10/12/2019  . HFrEF (heart failure with reduced ejection fraction) ([place])   . Cardiomyopathy ([place])   . Bacteremia   . AKI (acute kidney injury) ([place]) 10/07/2019  . Chronic back pain 10/07/2019  . Leukocytosis 10/07/2019  . Acute  neck pain 05/25/2019  . CKD (chronic kidney disease) stage 3, GFR 30-59 ml/min (HCC) 04/23/2016  . Hearing loss 04/23/2016  . Counseling regarding end of life decision making 04/15/2015   . Essential hypertension, benign 08/22/2012  . Diabetes mellitus with cardiac complication ([place]) 24/02/7352  . HYPERCHOLESTEROLEMIA 05/12/2009  . History of pheochromocytoma 09/07/2007  . GLAUCOMA 09/07/2007  . History of duodenal ulcer 09/07/2007  . BPH (benign prostatic hyperplasia) 09/07/2007  . OSTEOARTHRITIS 09/07/2007    Durwin Reges DPT [facility], SPT  Durwin Reges 04/23/2020, 12:50 PM  [place] [facility] [address], [place], [address] Phone: [phone]   Fax:  [phone]  Name: Dustin Harrison MRN: [id number] Date of Birth: 1931/09/02

## 2020-04-29 ENCOUNTER — Ambulatory Visit: Payer: Medicare PPO | Admitting: Physical Therapy

## 2020-05-05 ENCOUNTER — Other Ambulatory Visit: Payer: Self-pay

## 2020-05-05 ENCOUNTER — Encounter: Payer: Self-pay | Admitting: Physical Therapy

## 2020-05-05 ENCOUNTER — Ambulatory Visit: Payer: Medicare PPO | Admitting: Physical Therapy

## 2020-05-05 DIAGNOSIS — G8929 Other chronic pain: Secondary | ICD-10-CM

## 2020-05-05 DIAGNOSIS — R2689 Other abnormalities of gait and mobility: Secondary | ICD-10-CM | POA: Diagnosis not present

## 2020-05-05 DIAGNOSIS — M25561 Pain in right knee: Secondary | ICD-10-CM | POA: Diagnosis not present

## 2020-05-05 DIAGNOSIS — M545 Low back pain, unspecified: Secondary | ICD-10-CM | POA: Diagnosis not present

## 2020-05-05 NOTE — Therapy (Signed)
[place] [facility] [address] [place], [address] Phone: [phone]   Fax:  [phone]  Physical Therapy Treatment  Patient Details  Name: Dustin Harrison MRN: [id number] Date of Birth: [birth year] No data recorded  Encounter Date: 05/05/2020   PT End of Session - 05/05/20 1513    Visit Number 14    Number of Visits 18    Date for PT Re-Evaluation 05/16/20    Authorization - Visit Number 4    Authorization - Number of Visits 10    PT Start Time 0235    PT Stop Time 0313    PT Time Calculation (min) 38 min    Equipment Utilized During Treatment Gait belt    Activity Tolerance Patient tolerated treatment well    Behavior During Therapy [facility] for tasks assessed/performed           Past Medical History:  Diagnosis Date  . Abnormal glucose   . Arthritis   . Baker's cyst of knee    left  . BPH (benign prostatic hypertrophy)   . Cough    because of "tight" esophagus  . Diabetes mellitus   . Glaucoma   . HOH (hard of hearing)   . Hypertension   . Pheochromocytoma of right adrenal gland   . Ulcer   . Wears hearing aid    bilateral    Past Surgical History:  Procedure Laterality Date  . adrenaletomy    . CARDIAC CATHETERIZATION    . CATARACT EXTRACTION W/PHACO Left 10/08/2015   Procedure: CATARACT EXTRACTION PHACO AND INTRAOCULAR LENS PLACEMENT ([place]) left eye;  Surgeon: Leandrew Koyanagi, MD;  Location: [facility];  Service: Ophthalmology;  Laterality: Left;  MALYUGIN  . HERNIA REPAIR    . RIGHT/LEFT HEART CATH AND CORONARY ANGIOGRAPHY Left 11/28/2019   Procedure: RIGHT/LEFT HEART CATH AND CORONARY ANGIOGRAPHY;  Surgeon: Nelva Bush, MD;  Location: [place] [facility];  Service: Cardiovascular;  Laterality: Left;  . SQUAMOUS CELL CARCINOMA EXCISION  03-2006   left ear  . TEE WITHOUT CARDIOVERSION N/A 10/12/2019   Procedure: TRANSESOPHAGEAL ECHOCARDIOGRAM (TEE);  Surgeon: Minna Merritts, MD;  Location: [facility];  Service: Cardiovascular;  Laterality: N/A;  . TONSILLECTOMY    . XI ROBOTIC LAPAROSCOPIC ASSISTED APPENDECTOMY N/A 10/11/2019   Procedure: XI ROBOTIC ASSISTED LAPAROSCOPIC INSERTION GASTROSTOMY TUBE;  Surgeon: Jules Husbands, MD;  Location: [facility];  Service: General;  Laterality: N/A;    There were no vitals filed for this visit.   Subjective Assessment - 05/05/20 1436    Subjective Patient reports no pain in back or R knee and no falls since last visit. Pt states he is still attending exercise classes twice a week.    Pertinent History Pt is a 86 year old male presenting with R sided LBP >30years with R sided knee pain. Reports he used to have sciatica symptoms, does not experience them currently; denies pins/needles, and electrical sesnations. Has increased R sided LBP and R knee pain following stroke May 2020, reports no remaining motor or sensation deficits, and loss of L vocal cords. Reports worst knee pain is 4/10 LBP 3/10; best 0/10 for each. Reports pain in each is a dull ache and they come on seperately. Sleeping on R side, STS transfer, walking aggravates knee pain, reports some sensations that he feels like his knee is going to give way. LBP is aggravated by bending forward and sleeping on his back, he completes "posterior  pelvic tilts" regularly which he reports helps his knee pain. Endorses 1 fall/month d/t dizziness, PCP is following him for this. Patient reports he would like to be able to walk more, limited by pain in R knee and back. Pt drives, completes own shopping by leaning on cart, lives with wife, complets ADLs ind. Pt denies N/V, B&B changes, saddle paresthesia, fever, night sweats, or unrelenting night pain at this time. Pt reports he has lost a lot of weight since stroke, but he is being following for this. Patient is very [place] and has cochlear implant    Limitations Walking;House hold activities;Lifting;Standing    How long can you sit comfortably?  unlimited    How long can you stand comfortably? limited by dizziness only    How long can you walk comfortably? 34mins    Patient Stated Goals walk more without pain    Currently in Pain? No/denies    Pain Score 0-No pain    Pain Onset More than a month ago    Pain Onset More than a month ago          Ther-Ex Nustep L5 29mins; L6 76mins seat and UE setting 11, min cuing to maintain SPM over 70    Stand with overhand press 5# dumbells 3x8   Leg press 75# 3x8   Prone cobra stretch 2x30secs    Gait training  10# AW stepping over hurdles with alternating pattern Lateral banded walks with w/GTB  3x63ft                         PT Education - 05/05/20 1513    Education Details therex form/technique    Person(s) Educated Patient    Methods Explanation;Demonstration;Verbal cues    Comprehension Verbalized understanding;Returned demonstration;Verbal cues required            PT Short Term Goals - 04/09/20 1051      PT SHORT TERM GOAL #1   Title Pt will be independent with HEP in order to improve strength and decrease back pain in order to improve pain-free function at home and work.    Baseline 04/09/20- updated HEP    Time 4    Period Weeks    Status Revised             PT Long Term Goals - 04/09/20 1007      PT LONG TERM GOAL #1   Title Patient will increase FOTO score to 63 to demonstrate predicted increase in functional mobility to complete ADLs    Baseline 02/27/20 48    Time 8    Period Weeks    Status Deferred      PT LONG TERM GOAL #2   Title Pt will improve BERG to at least 45/56 in order to demonstrate clinically decreased fall risk cut off score    Baseline 02/27/20 40/56 04/09/20 51/56    Time 8    Period Weeks    Status Achieved      PT LONG TERM GOAL #3   Title Pt will perform 5TSTS in 15secs or less  in order to demonstrate age matched LE norms and strength    Baseline 04/09/20 10sec with use of UE    Time 8    Period Weeks     Status Revised      PT LONG TERM GOAL #4   Title Pt will increase 10MWT to at least 1.81m/s in order to demonstrate decreased fall risk with  normalized community ambulation speed    Baseline 02/27/20 Self-selected: s = 0.31m/s ; Fastest: s = 1.50m/s 04/09/20 s= 1.46m/s    Time 8    Period Weeks    Status Achieved      PT LONG TERM GOAL #5   Title Pt will decrease worst back pain as reported on NPRS by at least 2 points in order to demonstrate clinically significant reduction in back pain.    Baseline 02/27/20 5/10  04/10/11: 0/10    Time 8    Period Weeks    Status Achieved      Additional Long Term Goals   Additional Long Term Goals Yes      PT LONG TERM GOAL #6   Title Pt will be able to demonstrate sit to stand transfer without using UE to increase increase independence and safety in community and home    Baseline 04/09/20 use of UE from arm rest    Time 8    Period Weeks    Status New                 Plan - 05/05/20 1514    Clinical Impression Statement Pt continues to show ongoing progress with BLE strength and balance. Pt is able to complete therex to target power in BLE and increase walking and standing tolerance while out in the community. Pt is able to step over hurdles while wearing 10# AW on RLE with mod CGA to mimic walking on uneven grounds and stepping over obstacles when ambulating in the community. Pt able to complete lateral banded walks to activate and strengthen glutes in BLE, with rest break in between. PT will progress as able.    Personal Factors and Comorbidities Comorbidity 1;Comorbidity 2;Fitness;Comorbidity 3+;Past/Current Experience;Time since onset of injury/illness/exacerbation;Age    Comorbidities HTN, DM, HOH    Examination-Activity Limitations Stairs;Stand;Bed Mobility;Transfers;Squat;Locomotion Level;Lift    Examination-Participation Restrictions Laundry;Cleaning;Community Activity;Meal Prep;Yard Work    Stability/Clinical Decision Making  Stable/Uncomplicated    Clinical Decision Making Moderate    Rehab Potential Good    PT Frequency 2x / week    PT Duration 4 weeks    PT Treatment/Interventions ADLs/Self Care Home Management;Iontophoresis 4mg /ml Dexamethasone;Stair training;Therapeutic exercise;Passive range of motion;Spinal Manipulations;Joint Manipulations;Dry needling;Manual techniques;Patient/family education;Balance training;Neuromuscular re-education;Therapeutic activities;Functional mobility training;Moist Heat;Electrical Stimulation;Cryotherapy;Gait training;Ultrasound;DME Instruction    PT Next Visit Plan progress with exercises as tolerated    PT Home Exercise Plan SLS, mini squats, lateral band walks w/green theraband    Consulted and Agree with Plan of Care Patient           Patient will benefit from skilled therapeutic intervention in order to improve the following deficits and impairments:  Abnormal gait, Decreased balance, Decreased endurance, Decreased mobility, Difficulty walking, Cardiopulmonary status limiting activity, Decreased range of motion, Decreased activity tolerance, Decreased coordination, Decreased strength, Increased fascial restricitons, Dizziness, Impaired flexibility, Postural dysfunction, Pain, Improper body mechanics  Visit Diagnosis: Other abnormalities of gait and mobility  Chronic pain of right knee  Chronic right-sided low back pain without sciatica     Problem List Patient Active Problem List   Diagnosis Date Noted  . Vocal cord paralysis 12/27/2019  . Malnutrition of moderate degree 10/12/2019  . HFrEF (heart failure with reduced ejection fraction) ([place])   . Cardiomyopathy ([place])   . Bacteremia   . AKI (acute kidney injury) ([place]) 10/07/2019  . Chronic back pain 10/07/2019  . Leukocytosis 10/07/2019  . Acute neck pain 05/25/2019  . CKD (chronic kidney disease) stage  3, GFR 30-59 ml/min (HCC) 04/23/2016  . Hearing loss 04/23/2016  . Counseling regarding end of life  decision making 04/15/2015  . Essential hypertension, benign 08/22/2012  . Diabetes mellitus with cardiac complication ([place]) 16/03/9603  . HYPERCHOLESTEROLEMIA 05/12/2009  . History of pheochromocytoma 09/07/2007  . GLAUCOMA 09/07/2007  . History of duodenal ulcer 09/07/2007  . BPH (benign prostatic hyperplasia) 09/07/2007  . OSTEOARTHRITIS 09/07/2007    Durwin Reges DPT [facility], SPT Vikki Ports Terance Hart 05/05/2020, 5:02 PM  [place] [facility] [address] [place], [address] Phone: [phone]   Fax:  [phone]  Name: Dustin Harrison MRN: [id number] Date of Birth: 1931-12-23

## 2020-05-07 ENCOUNTER — Other Ambulatory Visit: Payer: Self-pay

## 2020-05-07 ENCOUNTER — Encounter: Payer: Self-pay | Admitting: Physical Therapy

## 2020-05-07 ENCOUNTER — Ambulatory Visit: Payer: Medicare PPO | Attending: Family Medicine | Admitting: Physical Therapy

## 2020-05-07 DIAGNOSIS — G8929 Other chronic pain: Secondary | ICD-10-CM | POA: Insufficient documentation

## 2020-05-07 DIAGNOSIS — M25561 Pain in right knee: Secondary | ICD-10-CM | POA: Diagnosis not present

## 2020-05-07 DIAGNOSIS — R2689 Other abnormalities of gait and mobility: Secondary | ICD-10-CM | POA: Insufficient documentation

## 2020-05-07 DIAGNOSIS — M545 Low back pain, unspecified: Secondary | ICD-10-CM | POA: Diagnosis not present

## 2020-05-07 NOTE — Therapy (Signed)
[facility] [address] [place], [address] Phone: [phone]   Fax:  [phone]  Physical Therapy Treatment Discharge Summary Reporting Period  02/27/20-05/07/20  Patient Details  Name: Dustin Harrison MRN: [id number] Date of Birth: 1931-10-29 No data recorded  Encounter Date: 05/07/2020   PT End of Session - 05/07/20 0942    Visit Number 15    Number of Visits 18    Date for PT Re-Evaluation 05/16/20    Authorization - Visit Number 5    Authorization - Number of Visits 10    PT Start Time 0913    PT Stop Time 0941    PT Time Calculation (min) 28 min    Activity Tolerance Patient tolerated treatment well    Behavior During Therapy [facility] for tasks assessed/performed           Past Medical History:  Diagnosis Date  . Abnormal glucose   . Arthritis   . Baker's cyst of knee    left  . BPH (benign prostatic hypertrophy)   . Cough    because of "tight" esophagus  . Diabetes mellitus   . Glaucoma   . HOH (hard of hearing)   . Hypertension   . Pheochromocytoma of right adrenal gland   . Ulcer   . Wears hearing aid    bilateral    Past Surgical History:  Procedure Laterality Date  . adrenaletomy    . CARDIAC CATHETERIZATION    . CATARACT EXTRACTION W/PHACO Left 10/08/2015   Procedure: CATARACT EXTRACTION PHACO AND INTRAOCULAR LENS PLACEMENT ([place]) left eye;  Surgeon: Leandrew Koyanagi, MD;  Location: [facility];  Service: Ophthalmology;  Laterality: Left;  MALYUGIN  . HERNIA REPAIR    . RIGHT/LEFT HEART CATH AND CORONARY ANGIOGRAPHY Left 11/28/2019   Procedure: RIGHT/LEFT HEART CATH AND CORONARY ANGIOGRAPHY;  Surgeon: Nelva Bush, MD;  Location: [facility];  Service: Cardiovascular;  Laterality: Left;  . SQUAMOUS CELL CARCINOMA EXCISION  03-2006   left ear  . TEE WITHOUT CARDIOVERSION N/A 10/12/2019   Procedure: TRANSESOPHAGEAL ECHOCARDIOGRAM (TEE);  Surgeon: Minna Merritts, MD;  Location: [facility];  Service: Cardiovascular;  Laterality: N/A;  . TONSILLECTOMY    . XI ROBOTIC LAPAROSCOPIC ASSISTED APPENDECTOMY N/A 10/11/2019   Procedure: XI ROBOTIC ASSISTED LAPAROSCOPIC INSERTION GASTROSTOMY TUBE;  Surgeon: Jules Husbands, MD;  Location: [facility];  Service: General;  Laterality: N/A;    There were no vitals filed for this visit.   Subjective Assessment - 05/07/20 0925    Subjective Patient reports no pain in legs or back and continues to remain active outside of PT sessions.    Pertinent History Pt is a 86 year old male presenting with R sided LBP >30years with R sided knee pain. Reports he used to have sciatica symptoms, does not experience them currently; denies pins/needles, and electrical sesnations. Has increased R sided LBP and R knee pain following stroke May 2020, reports no remaining motor or sensation deficits, and loss of L vocal cords. Reports worst knee pain is 4/10 LBP 3/10; best 0/10 for each. Reports pain in each is a dull ache and they come on seperately. Sleeping on R side, STS transfer, walking aggravates knee pain, reports some sensations that he feels like his knee is going to give way. LBP is aggravated by bending forward and sleeping on his back, he completes "posterior pelvic tilts" regularly which he reports helps his knee pain. Endorses 1  fall/month d/t dizziness, PCP is following him for this. Patient reports he would like to be able to walk more, limited by pain in R knee and back. Pt drives, completes own shopping by leaning on cart, lives with wife, complets ADLs ind. Pt denies N/V, B&B changes, saddle paresthesia, fever, night sweats, or unrelenting night pain at this time. Pt reports he has lost a lot of weight since stroke, but he is being following for this. Patient is very [place] and has cochlear implant    Limitations Walking;House hold activities;Lifting;Standing    How long can you sit comfortably? unlimited    How long can you  stand comfortably? limited by dizziness only    How long can you walk comfortably? 64mins    Patient Stated Goals walk more without pain    Currently in Pain? No/denies    Pain Onset More than a month ago    Pain Onset More than a month ago             9:13-9:41 (Pt arrived late)   Therex Nu step UE and LE 11, L5 5 min   STS in 15sec or less w/o UE 9.61sec   HEP  SLS 2x30 bilat  Prone cobra stretch 2x30sec   Squats 3x10   Lateral banded walks GTB                         PT Education - 05/07/20 0926    Education Details therex form/technique    Person(s) Educated Patient    Methods Explanation;Demonstration;Verbal cues    Comprehension Verbalized understanding;Returned demonstration;Verbal cues required            PT Short Term Goals - 05/07/20 0931      PT SHORT TERM GOAL #1   Title Pt will be independent with HEP in order to improve strength and decrease back pain in order to improve pain-free function at home and work.    Baseline 04/09/20- updated HEP 05/07/20 d/c HEP given    Time 4    Period Weeks    Status Achieved             PT Long Term Goals - 05/07/20 0932      PT LONG TERM GOAL #1   Title Patient will increase FOTO score to 63 to demonstrate predicted increase in functional mobility to complete ADLs    Baseline 02/27/20 48    Time 8    Period Weeks    Status Deferred      PT LONG TERM GOAL #2   Title Pt will improve BERG to at least 45/56 in order to demonstrate clinically decreased fall risk cut off score    Baseline 02/27/20 40/56 04/09/20 51/56    Time 8    Period Weeks    Status Achieved      PT LONG TERM GOAL #3   Title Pt will perform 5TSTS in 15secs or less  in order to demonstrate age matched LE norms and strength    Baseline 04/09/20 10sec with use of UE 05/07/20 9.61 sec w/o UE use    Time 8    Period Weeks    Status Achieved      PT LONG TERM GOAL #4   Title Pt will increase 10MWT to at least 1.43m/s in  order to demonstrate decreased fall risk with normalized community ambulation speed    Baseline 02/27/20 Self-selected: s = 0.76m/s ; Fastest: s = 1.53m/s 04/09/20  s= 1.61m/s    Time 8    Period Weeks    Status Achieved      PT LONG TERM GOAL #5   Title Pt will decrease worst back pain as reported on NPRS by at least 2 points in order to demonstrate clinically significant reduction in back pain.    Baseline 02/27/20 5/10  04/10/11: 0/10    Time 8    Period Weeks    Status Achieved      PT LONG TERM GOAL #6   Title Pt will be able to demonstrate sit to stand transfer without using UE to increase increase independence and safety in community and home    Baseline 04/09/20 use of UE from arm rest 05/08/20 able to do w/o use of UE    Time 8    Period Weeks    Status Achieved                 Plan - 05/07/20 0942    Clinical Impression Statement PT session focused on updating HEP to continue with BLE and low back mobility and strengthening to increase ease with community ambulation and transfers. Pt was educated on frequency and form to complete exercises daily along with Hydro[employer] courses and 20-64mins of walking every day. Pt able to demonstrate all updated therapeutic exercises with no difficulty and able to complete without needing CGA. D/C from PT.    Personal Factors and Comorbidities Comorbidity 1;Comorbidity 2;Fitness;Comorbidity 3+;Past/Current Experience;Time since onset of injury/illness/exacerbation;Age    Comorbidities HTN, DM, HOH    Examination-Activity Limitations Stairs;Stand;Bed Mobility;Transfers;Squat;Locomotion Level;Lift    Examination-Participation Restrictions Laundry;Cleaning;Community Activity;Meal Prep;Yard Work    Stability/Clinical Decision Making Stable/Uncomplicated    Clinical Decision Making Moderate    Rehab Potential Good    PT Frequency 2x / week    PT Duration 4 weeks    PT Treatment/Interventions ADLs/Self Care Home Management;Iontophoresis 4mg /ml  Dexamethasone;Stair training;Therapeutic exercise;Passive range of motion;Spinal Manipulations;Joint Manipulations;Dry needling;Manual techniques;Patient/family education;Balance training;Neuromuscular re-education;Therapeutic activities;Functional mobility training;Moist Heat;Electrical Stimulation;Cryotherapy;Gait training;Ultrasound;DME Instruction    PT Next Visit Plan progress with exercises as tolerated    PT Home Exercise Plan SLS, sqauts, lateral band walks, prone cobra stretch    Consulted and Agree with Plan of Care Patient           Patient will benefit from skilled therapeutic intervention in order to improve the following deficits and impairments:  Abnormal gait, Decreased balance, Decreased endurance, Decreased mobility, Difficulty walking, Cardiopulmonary status limiting activity, Decreased range of motion, Decreased activity tolerance, Decreased coordination, Decreased strength, Increased fascial restricitons, Dizziness, Impaired flexibility, Postural dysfunction, Pain, Improper body mechanics  Visit Diagnosis: Other abnormalities of gait and mobility  Chronic pain of right knee  Chronic right-sided low back pain without sciatica     Problem List Patient Active Problem List   Diagnosis Date Noted  . Vocal cord paralysis 12/27/2019  . Malnutrition of moderate degree 10/12/2019  . HFrEF (heart failure with reduced ejection fraction) ([place])   . Cardiomyopathy ([place])   . Bacteremia   . AKI (acute kidney injury) ([place]) 10/07/2019  . Chronic back pain 10/07/2019  . Leukocytosis 10/07/2019  . Acute neck pain 05/25/2019  . CKD (chronic kidney disease) stage 3, GFR 30-59 ml/min ([facility]) 04/23/2016  . Hearing loss 04/23/2016  . Counseling regarding end of life decision making 04/15/2015  . Essential hypertension, benign 08/22/2012  . Diabetes mellitus with cardiac complication ([address]) 66/29/4765  . HYPERCHOLESTEROLEMIA 05/12/2009  . History of pheochromocytoma 09/07/2007  .  GLAUCOMA 09/07/2007  . History of duodenal ulcer 09/07/2007  . BPH (benign prostatic hyperplasia) 09/07/2007  . OSTEOARTHRITIS 09/07/2007    Durwin Reges DPT [facility], SPT Durwin Reges 05/07/2020, 11:55 AM  [facility] [address], [place], [address] Phone: [phone]   Fax:  [phone]  Name: Dustin Harrison MRN: [id number] Date of Birth: 03/22/1932

## 2020-05-13 ENCOUNTER — Telehealth: Payer: Self-pay | Admitting: Family Medicine

## 2020-05-13 DIAGNOSIS — E1159 Type 2 diabetes mellitus with other circulatory complications: Secondary | ICD-10-CM

## 2020-05-13 DIAGNOSIS — D72829 Elevated white blood cell count, unspecified: Secondary | ICD-10-CM

## 2020-05-13 NOTE — Telephone Encounter (Signed)
-----   Message from Cloyd Stagers, RT sent at 05/13/2020  1:38 PM EST ----- Regarding: Lab Orders for Tuesday 12.21.2021 Please place lab orders for Tuesday 12.21.2021, office visit for physical on Friday 1.7.2022 Thank you, Dyke Maes RT(R)

## 2020-05-14 ENCOUNTER — Other Ambulatory Visit: Payer: Self-pay | Admitting: Family Medicine

## 2020-05-14 ENCOUNTER — Other Ambulatory Visit: Payer: Self-pay | Admitting: Family

## 2020-05-14 NOTE — Telephone Encounter (Signed)
Rx request sent to pharmacy.  

## 2020-05-26 ENCOUNTER — Ambulatory Visit: Payer: Medicare Other

## 2020-05-27 ENCOUNTER — Other Ambulatory Visit: Payer: Medicare Other

## 2020-05-28 ENCOUNTER — Other Ambulatory Visit (INDEPENDENT_AMBULATORY_CARE_PROVIDER_SITE_OTHER): Payer: Medicare PPO

## 2020-05-28 ENCOUNTER — Other Ambulatory Visit: Payer: Self-pay

## 2020-05-28 DIAGNOSIS — E1159 Type 2 diabetes mellitus with other circulatory complications: Secondary | ICD-10-CM

## 2020-05-28 LAB — LIPID PANEL
Cholesterol: 170 mg/dL (ref 0–200)
HDL: 89.3 mg/dL (ref >39.00)
LDL Cholesterol: 71 mg/dL (ref 0–99)
NonHDL: 80.51
Total CHOL/HDL Ratio: 2
Triglycerides: 49 mg/dL (ref 0.0–149.0)
VLDL: 9.8 mg/dL (ref 0.0–40.0)

## 2020-05-28 LAB — COMPREHENSIVE METABOLIC PANEL
ALT: 32 U/L (ref 0–53)
AST: 25 U/L (ref 0–37)
Albumin: 4.5 g/dL (ref 3.5–5.2)
Alkaline Phosphatase: 70 U/L (ref 39–117)
BUN: 44 mg/dL — ABNORMAL HIGH (ref 6–23)
CO2: 28 mEq/L (ref 19–32)
Calcium: 9.8 mg/dL (ref 8.4–10.5)
Chloride: 103 mEq/L (ref 96–112)
Creatinine, Ser: 1.64 mg/dL — ABNORMAL HIGH (ref 0.40–1.50)
GFR: 37.67 mL/min — ABNORMAL LOW (ref >60.00)
Glucose, Bld: 127 mg/dL — ABNORMAL HIGH (ref 70–99)
Potassium: 4.7 mEq/L (ref 3.5–5.1)
Sodium: 138 mEq/L (ref 135–145)
Total Bilirubin: 0.7 mg/dL (ref 0.2–1.2)
Total Protein: 7.3 g/dL (ref 6.0–8.3)

## 2020-05-28 LAB — HEMOGLOBIN A1C: Hgb A1c MFr Bld: 6.4 % (ref 4.6–6.5)

## 2020-05-28 NOTE — Progress Notes (Signed)
No critical labs need to be addressed urgently. We will discuss labs in detail at upcoming office visit.   

## 2020-06-03 ENCOUNTER — Encounter: Payer: Medicare Other | Admitting: Family Medicine

## 2020-06-04 ENCOUNTER — Other Ambulatory Visit: Payer: Self-pay

## 2020-06-04 ENCOUNTER — Ambulatory Visit (INDEPENDENT_AMBULATORY_CARE_PROVIDER_SITE_OTHER): Payer: Medicare PPO

## 2020-06-04 DIAGNOSIS — Z0000 Encounter for general adult medical examination without abnormal findings: Secondary | ICD-10-CM

## 2020-06-04 NOTE — Patient Instructions (Signed)
Dustin Harrison , Thank you for taking time to come for your Medicare Wellness Visit. I appreciate your ongoing commitment to your health goals. Please review the following plan we discussed and let me know if I can assist you in the future.   Screening recommendations/referrals: Colonoscopy: no longer required Recommended yearly ophthalmology/optometry visit for glaucoma screening and checkup Recommended yearly dental visit for hygiene and checkup  Vaccinations: Influenza vaccine: Up to date, completed 02/21/2020, due 01/2021 Pneumococcal vaccine: Completed series Tdap vaccine: Up to date, completed 09/05/2013, due 09/2023 Shingles vaccine: Completed series   Covid-19: Completed series  Advanced directives: Please bring a copy of your POA (Power of Attorney) and/or Living Will to your next appointment.   Conditions/risks identified: Diabetes, Hypertension  Next appointment: Follow up in one year for your annual wellness visit.   Preventive Care 32 Years and Older, Male Preventive care refers to lifestyle choices and visits with your health care provider that can promote health and wellness. What does preventive care include?  A yearly physical exam. This is also called an annual well check.  Dental exams once or twice a year.  Routine eye exams. Ask your health care provider how often you should have your eyes checked.  Personal lifestyle choices, including:  Daily care of your teeth and gums.  Regular physical activity.  Eating a healthy diet.  Avoiding tobacco and drug use.  Limiting alcohol use.  Practicing safe sex.  Taking low doses of aspirin every day.  Taking vitamin and mineral supplements as recommended by your health care provider. What happens during an annual well check? The services and screenings done by your health care provider during your annual well check will depend on your age, overall health, lifestyle risk factors, and family history of  disease. Counseling  Your health care provider may ask you questions about your:  Alcohol use.  Tobacco use.  Drug use.  Emotional well-being.  Home and relationship well-being.  Sexual activity.  Eating habits.  History of falls.  Memory and ability to understand (cognition).  Work and work Astronomer. Screening  You may have the following tests or measurements:  Height, weight, and BMI.  Blood pressure.  Lipid and cholesterol levels. These may be checked every 5 years, or more frequently if you are over 58 years old.  Skin check.  Lung cancer screening. You may have this screening every year starting at age 63 if you have a 30-pack-year history of smoking and currently smoke or have quit within the past 15 years.  Fecal occult blood test (FOBT) of the stool. You may have this test every year starting at age 38.  Flexible sigmoidoscopy or colonoscopy. You may have a sigmoidoscopy every 5 years or a colonoscopy every 10 years starting at age 79.  Prostate cancer screening. Recommendations will vary depending on your family history and other risks.  Hepatitis C blood test.  Hepatitis B blood test.  Sexually transmitted disease (STD) testing.  Diabetes screening. This is done by checking your blood sugar (glucose) after you have not eaten for a while (fasting). You may have this done every 1-3 years.  Abdominal aortic aneurysm (AAA) screening. You may need this if you are a current or former smoker.  Osteoporosis. You may be screened starting at age 39 if you are at high risk. Talk with your health care provider about your test results, treatment options, and if necessary, the need for more tests. Vaccines  Your health care provider may recommend certain  vaccines, such as:  Influenza vaccine. This is recommended every year.  Tetanus, diphtheria, and acellular pertussis (Tdap, Td) vaccine. You may need a Td booster every 10 years.  Zoster vaccine. You may  need this after age 63.  Pneumococcal 13-valent conjugate (PCV13) vaccine. One dose is recommended after age 25.  Pneumococcal polysaccharide (PPSV23) vaccine. One dose is recommended after age 22. Talk to your health care provider about which screenings and vaccines you need and how often you need them. This information is not intended to replace advice given to you by your health care provider. Make sure you discuss any questions you have with your health care provider. Document Released: 06/20/2015 Document Revised: 02/11/2016 Document Reviewed: 03/25/2015 Elsevier Interactive Patient Education  2017 [employer] Prevention in the Home Falls can cause injuries. They can happen to people of all ages. There are many things you can do to make your home safe and to help prevent falls. What can I do on the outside of my home?  Regularly fix the edges of walkways and driveways and fix any cracks.  Remove anything that might make you trip as you walk through a door, such as a raised step or threshold.  Trim any bushes or trees on the path to your home.  Use bright outdoor lighting.  Clear any walking paths of anything that might make someone trip, such as rocks or tools.  Regularly check to see if handrails are loose or broken. Make sure that both sides of any steps have handrails.  Any raised decks and porches should have guardrails on the edges.  Have any leaves, snow, or ice cleared regularly.  Use sand or salt on walking paths during winter.  Clean up any spills in your garage right away. This includes oil or grease spills. What can I do in the bathroom?  Use night lights.  Install grab bars by the toilet and in the tub and shower. Do not use towel bars as grab bars.  Use non-skid mats or decals in the tub or shower.  If you need to sit down in the shower, use a plastic, non-slip stool.  Keep the floor dry. Clean up any water that spills on the floor as soon as it  happens.  Remove soap buildup in the tub or shower regularly.  Attach bath mats securely with double-sided non-slip rug tape.  Do not have throw rugs and other things on the floor that can make you trip. What can I do in the bedroom?  Use night lights.  Make sure that you have a light by your bed that is easy to reach.  Do not use any sheets or blankets that are too big for your bed. They should not hang down onto the floor.  Have a firm chair that has side arms. You can use this for support while you get dressed.  Do not have throw rugs and other things on the floor that can make you trip. What can I do in the kitchen?  Clean up any spills right away.  Avoid walking on wet floors.  Keep items that you use a lot in easy-to-reach places.  If you need to reach something above you, use a strong step stool that has a grab bar.  Keep electrical cords out of the way.  Do not use floor polish or wax that makes floors slippery. If you must use wax, use non-skid floor wax.  Do not have throw rugs and other things  on the floor that can make you trip. What can I do with my stairs?  Do not leave any items on the stairs.  Make sure that there are handrails on both sides of the stairs and use them. Fix handrails that are broken or loose. Make sure that handrails are as long as the stairways.  Check any carpeting to make sure that it is firmly attached to the stairs. Fix any carpet that is loose or worn.  Avoid having throw rugs at the top or bottom of the stairs. If you do have throw rugs, attach them to the floor with carpet tape.  Make sure that you have a light switch at the top of the stairs and the bottom of the stairs. If you do not have them, ask someone to add them for you. What else can I do to help prevent falls?  Wear shoes that:  Do not have high heels.  Have rubber bottoms.  Are comfortable and fit you well.  Are closed at the toe. Do not wear sandals.  If you  use a stepladder:  Make sure that it is fully opened. Do not climb a closed stepladder.  Make sure that both sides of the stepladder are locked into place.  Ask someone to hold it for you, if possible.  Clearly mark and make sure that you can see:  Any grab bars or handrails.  First and last steps.  Where the edge of each step is.  Use tools that help you move around (mobility aids) if they are needed. These include:  Canes.  Walkers.  Scooters.  Crutches.  Turn on the lights when you go into a dark area. Replace any light bulbs as soon as they burn out.  Set up your furniture so you have a clear path. Avoid moving your furniture around.  If any of your floors are uneven, fix them.  If there are any pets around you, be aware of where they are.  Review your medicines with your doctor. Some medicines can make you feel dizzy. This can increase your chance of falling. Ask your doctor what other things that you can do to help prevent falls. This information is not intended to replace advice given to you by your health care provider. Make sure you discuss any questions you have with your health care provider. Document Released: 03/20/2009 Document Revised: 10/30/2015 Document Reviewed: 06/28/2014 Elsevier Interactive Patient Education  2017 [employer].

## 2020-06-04 NOTE — Progress Notes (Signed)
PCP notes:  Health Maintenance: No gaps noted   Abnormal Screenings: none   Patient concerns: 90 day supply on pantoprazole   Nurse concerns: none   Next PCP appt.: 06/13/2020 @ 11 am

## 2020-06-04 NOTE — Progress Notes (Signed)
Subjective:   Dustin Harrison is a 86 y.o. male who presents for Medicare Annual/Subsequent preventive examination.  Review of Systems: N/A     I connected with the patient today by telephone and verified that I am speaking with the correct person using two identifiers. Location patient: home Location nurse: work Persons participating in the telephone visit: patient, nurse.   I discussed the limitations, risks, security and privacy concerns of performing an evaluation and management service by telephone and the availability of in person appointments. I also discussed with the patient that there may be a patient responsible charge related to this service. The patient expressed understanding and verbally consented to this telephonic visit.        Cardiac Risk Factors include: advanced age (>67men, >26 women);diabetes mellitus;hypertension;male gender     Objective:    Today's Vitals   There is no height or weight on file to calculate BMI.  Advanced Directives 06/04/2020 02/27/2020 11/28/2019 10/11/2019 10/07/2019 05/10/2018 04/27/2017  Does Patient Have a Medical Advance Directive? Yes Yes Yes No Yes Yes Yes  Type of Paramedic of [place];Living will Living will;Healthcare Power of [place];Living will [place];Living will  Does patient want to make changes to medical advance directive? - No - Patient declined No - Patient declined No - Patient declined No - Patient declined - -  Copy of [place] in Chart? No - copy requested No - copy requested No - copy requested - No - copy requested No - copy requested No - copy requested  Would patient like information on creating a medical advance directive? - - - No - Patient declined - - -    Current Medications (verified) Outpatient Encounter Medications as of 06/04/2020  Medication Sig  .  amLODipine (NORVASC) 5 MG tablet TAKE 1 TABLET BY MOUTH DAILY  . atorvastatin (LIPITOR) 20 MG tablet TAKE ONE TABLET EVERY DAY  . carvedilol (COREG) 6.25 MG tablet Take 0.5 tablets (3.125 mg total) by mouth in the morning and at bedtime.  . empagliflozin (JARDIANCE) 10 MG TABS tablet Take 1 tablet (10 mg total) by mouth daily before breakfast.  . finasteride (PROSCAR) 5 MG tablet TAKE ONE TABLET EVERY DAY  . latanoprost (XALATAN) 0.005 % ophthalmic solution Place 1 drop into both eyes at bedtime.  . lidocaine (LIDODERM) 5 % Place 1 patch onto the skin at bedtime. Remove & Discard patch within 12 hours or as directed by MD  . losartan (COZAAR) 100 MG tablet TAKE ONE TABLET EVERY DAY  . meloxicam (MOBIC) 7.5 MG tablet TAKE ONE TABLET BY MOUTH EVERY MORNING AND AT BEDTIME  . metFORMIN (GLUCOPHAGE) 500 MG tablet TAKE ONE TABLET BY MOUTH TWICE DAILY WITH A MEAL  . mirtazapine (REMERON) 15 MG tablet TAKE ONE TABLET BY MOUTH AT BEDTIME  . pantoprazole (PROTONIX) 20 MG tablet Take 1 tablet (20 mg total) by mouth daily.  . polyethylene glycol powder (GLYCOLAX/MIRALAX) 17 GM/SCOOP powder Take 1 Container by mouth daily as needed for mild constipation or moderate constipation.   Marland Kitchen spironolactone (ALDACTONE) 25 MG tablet TAKE 1/2 TABLET EVERYDAY  . tamsulosin (FLOMAX) 0.4 MG CAPS capsule TAKE 2 CAPSULES EVERY DAY  . timolol (TIMOPTIC) 0.5 % ophthalmic solution Place 1 drop into both eyes 2 (two) times daily.   . cloNIDine (CATAPRES - DOSED IN MG/24 HR) 0.1 mg/24hr patch 0.1 mg once a week. (Patient  not taking: Reported on 06/04/2020)   No facility-administered encounter medications on file as of 06/04/2020.    Allergies (verified) Patient has no known allergies.   History: Past Medical History:  Diagnosis Date  . Abnormal glucose   . Arthritis   . Baker's cyst of knee    left  . BPH (benign prostatic hypertrophy)   . Cough    because of "tight" esophagus  . Diabetes mellitus   . Glaucoma   .  HOH (hard of hearing)   . Hypertension   . Pheochromocytoma of right adrenal gland   . Ulcer   . Wears hearing aid    bilateral   Past Surgical History:  Procedure Laterality Date  . adrenaletomy    . CARDIAC CATHETERIZATION    . CATARACT EXTRACTION W/PHACO Left 10/08/2015   Procedure: CATARACT EXTRACTION PHACO AND INTRAOCULAR LENS PLACEMENT ([place]) left eye;  Surgeon: Leandrew Koyanagi, MD;  Location: [facility];  Service: Ophthalmology;  Laterality: Left;  MALYUGIN  . HERNIA REPAIR    . RIGHT/LEFT HEART CATH AND CORONARY ANGIOGRAPHY Left 11/28/2019   Procedure: RIGHT/LEFT HEART CATH AND CORONARY ANGIOGRAPHY;  Surgeon: Nelva Bush, MD;  Location: [facility];  Service: Cardiovascular;  Laterality: Left;  . SQUAMOUS CELL CARCINOMA EXCISION  03-2006   left ear  . TEE WITHOUT CARDIOVERSION N/A 10/12/2019   Procedure: TRANSESOPHAGEAL ECHOCARDIOGRAM (TEE);  Surgeon: Minna Merritts, MD;  Location: [facility];  Service: Cardiovascular;  Laterality: N/A;  . TONSILLECTOMY    . XI ROBOTIC LAPAROSCOPIC ASSISTED APPENDECTOMY N/A 10/11/2019   Procedure: XI ROBOTIC ASSISTED LAPAROSCOPIC INSERTION GASTROSTOMY TUBE;  Surgeon: Jules Husbands, MD;  Location: [facility];  Service: General;  Laterality: N/A;   Family History  Problem Relation Age of Onset  . Alcohol abuse Mother   . Coronary artery disease Mother   . Brain cancer Father        tumor  . Diabetes Brother    Social History   Socioeconomic History  . Marital status: Married    Spouse name: Not on file  . Number of children: Not on file  . Years of education: Not on file  . Highest education level: Not on file  Occupational History  . Occupation: chaplain  Tobacco Use  . Smoking status: Never Smoker  . Smokeless tobacco: Never Used  Vaping Use  . Vaping Use: Never used  Substance and Sexual Activity  . Alcohol use: Yes    Alcohol/week: 1.0 standard drink    Types: 1 Glasses of wine per week    Comment: 1  glass per week  . Drug use: No  . Sexual activity: Not Currently  Other Topics Concern  . Not on file  Social History Narrative   Regular exercise- yes- 3-4 times a week treadmill/walk   Diet: fruits and veggies,water   Living will, HCPOA (wife) , full code (reviewed 33)   Social Determinants of Health   Financial Resource Strain: Low Risk   . Difficulty of Paying Living Expenses: Not hard at all  Food Insecurity: No Food Insecurity  . Worried About Charity fundraiser in the Last Year: Never true  . Ran Out of Food in the Last Year: Never true  Transportation Needs: No Transportation Needs  . Lack of Transportation (Medical): No  . Lack of Transportation (Non-Medical): No  Physical Activity: Sufficiently Active  . Days of Exercise per Week: 5 days  . Minutes of Exercise per Session: 50 min  Stress:  No Stress Concern Present  . Feeling of Stress : Not at all  Social Connections: Not on file    Tobacco Counseling Counseling given: Not Answered   Clinical Intake:  Pre-visit preparation completed: Yes  Pain : No/denies pain     Nutritional Risks: None Diabetes: Yes CBG done?: No Did pt. bring in CBG monitor from home?: No  How often do you need to have someone help you when you read instructions, pamphlets, or other written materials from your doctor or pharmacy?: 1 - Never What is the last grade level you completed in school?: Phd  Diabetic: Yes Nutrition Risk Assessment:  Has the patient had any N/V/D within the last 2 months?  No  Does the patient have any non-healing wounds?  No  Has the patient had any unintentional weight loss or weight gain?  No   Diabetes:  Is the patient diabetic?  Yes  If diabetic, was a CBG obtained today?  No  telephone visit  Did the patient bring in their glucometer from home?  No  telephone visit  How often do you monitor your CBG's? occasionally.   Financial Strains and Diabetes Management:  Are you having any financial  strains with the device, your supplies or your medication? No .  Does the patient want to be seen by Chronic Care Management for management of their diabetes?  No  Would the patient like to be referred to a Nutritionist or for Diabetic Management?  No   Diabetic Exams:  Diabetic Eye Exam: Completed 01/25/2020 Diabetic Foot Exam: Completed 03/10/2020   Interpreter Needed?: No  Information entered by :: CJohnson, LPN   Activities of Daily Living In your present state of health, do you have any difficulty performing the following activities: 06/04/2020 11/28/2019  Hearing? Tempie Donning  Comment wears hearing aids -  Vision? N N  Difficulty concentrating or making decisions? N Y  Walking or climbing stairs? N Y  Dressing or bathing? N N  Doing errands, shopping? N -  Preparing Food and eating ? N -  Using the Toilet? N -  In the past six months, have you accidently leaked urine? N -  Do you have problems with loss of bowel control? N -  Managing your Medications? Y -  Comment daughter helps with this -  Managing your Finances? Y -  Comment daughter helps with this -  Housekeeping or managing your Housekeeping? N -  Some recent data might be hidden    Patient Care Team: Jinny Sanders, MD as PCP - General Rockey Situ Kathlene November, MD as PCP - Cardiology (Cardiology) Leandrew Koyanagi, MD as Referring Physician (Ophthalmology) Dasher, Rayvon Char, MD as Referring Physician (Dermatology)  Indicate any recent Medical Services you may have received from other than [facility] providers in the past year (date may be approximate).     Assessment:   This is a routine wellness examination for Dinari.  Hearing/Vision screen  Hearing Screening   125Hz  250Hz  500Hz  1000Hz  2000Hz  3000Hz  4000Hz  6000Hz  8000Hz   Right ear:           Left ear:           Vision Screening Comments: Patient gets annual eye exams  Dietary issues and exercise activities discussed: Current Exercise Habits: Home exercise  routine;Structured exercise class, Type of exercise: walking;Other - see comments (water aerobics), Time (Minutes): 45, Frequency (Times/Week): 5, Weekly Exercise (Minutes/Week): 225, Intensity: Moderate, Exercise limited by: None identified  Goals    . Increase  physical activity     Starting 05/10/2018, I will continue to exercise for 15 minutes 5 days per week.     . Patient Stated     06/04/2020, I will continue to walk 3 days a week for about 30 minutes and water aerobics 2 days a week for about 45 minutes.      Depression Screen PHQ 2/9 Scores 06/04/2020 05/25/2019 05/10/2018 04/27/2017 04/23/2016 04/15/2015 01/17/2014  PHQ - 2 Score 0 0 0 0 0 0 0  PHQ- 9 Score 0 - 0 0 - - -    Fall Risk Fall Risk  06/04/2020 05/25/2019 05/10/2018 04/27/2017 04/23/2016  Falls in the past year? 1 0 0 No No  Number falls in past yr: 0 - - - -  Injury with Fall? 0 - - - -  Risk for fall due to : Medication side effect;Impaired balance/gait - - - -  Follow up Falls evaluation completed;Falls prevention discussed - - - -    FALL RISK PREVENTION PERTAINING TO THE HOME:  Any stairs in or around the home? Yes  If so, are there any without handrails? No  Home free of loose throw rugs in walkways, pet beds, electrical cords, etc? Yes  Adequate lighting in your home to reduce risk of falls? Yes   ASSISTIVE DEVICES UTILIZED TO PREVENT FALLS:  Life alert? No  Use of a cane, walker or w/c? Yes  Grab bars in the bathroom? No  Shower chair or bench in shower? Yes  Elevated toilet seat or a handicapped toilet? No   TIMED UP AND GO:  Was the test performed? N/A, telephone visit.   Cognitive Function: MMSE - Mini Mental State Exam 06/04/2020 05/10/2018 04/27/2017  Orientation to time 5 5 5   Orientation to Place 5 5 5   Registration 3 3 3   Attention/ Calculation 5 0 0  Recall 3 2 3   Recall-comments - unable to recall 1 of 3 words -  Language- name 2 objects - 0 0  Language- repeat 1 1 1   Language-  follow 3 step command - 3 3  Language- read & follow direction - 0 0  Write a sentence - 0 0  Copy design - 0 0  Total score - 19 20  Mini Cog  Mini-Cog screen was completed. Maximum score is 22. A value of 0 denotes this part of the MMSE was not completed or the patient failed this part of the Mini-Cog screening.       Immunizations Immunization History  Administered Date(s) Administered  . Influenza Whole 02/25/2009, 01/30/2010, 02/11/2012  . Influenza, High Dose Seasonal PF 01/25/2015, 02/17/2016, 03/03/2017, 03/06/2018, 02/10/2019  . Influenza-Unspecified 01/09/2013, 02/21/2020  . Moderna Sars-Covid-2 Vaccination 06/21/2019, 07/19/2019, 04/22/2020  . Pneumococcal Conjugate-13 01/17/2014  . Pneumococcal Polysaccharide-23 03/07/2006  . Td 11/08/2006  . Tdap 09/05/2013  . Zoster 12/16/2005  . Zoster Recombinat (Shingrix) 08/27/2016, 11/24/2016    TDAP status: Up to date  Flu Vaccine status: Up to date  Pneumococcal vaccine status: Up to date  Covid-19 vaccine status: Completed vaccines  Qualifies for Shingles Vaccine? Yes   Zostavax completed Yes   Shingrix Completed?: Yes  Screening Tests Health Maintenance  Topic Date Due  . HEMOGLOBIN A1C  11/26/2020  . OPHTHALMOLOGY EXAM  01/24/2021  . FOOT EXAM  03/10/2021  . TETANUS/TDAP  09/06/2023  . INFLUENZA VACCINE  Completed  . COVID-19 Vaccine  Completed  . PNA vac Low Risk Adult  Completed    Health Maintenance  There  are no preventive care reminders to display for this patient.  Colorectal cancer screening: No longer required.   Lung Cancer Screening: (Low Dose CT Chest recommended if Age 89-80 years, 30 pack-year currently smoking OR have quit w/in 15 years.) does not qualify.   Additional Screening:  Hepatitis C Screening: does not qualify; Completed N/A  Vision Screening: Recommended annual ophthalmology exams for early detection of glaucoma and other disorders of the eye. Is the patient up to date  with their annual eye exam?  Yes  Who is the provider or what is the name of the office in which the patient attends annual eye exams? Dr. Lockie Mola  If pt is not established with a provider, would they like to be referred to a provider to establish care? No .   Dental Screening: Recommended annual dental exams for proper oral hygiene  Community Resource Referral / Chronic Care Management: CRR required this visit?  No   CCM required this visit?  No      Plan:     I have personally reviewed and noted the following in the patient's chart:   . Medical and social history . Use of alcohol, tobacco or illicit drugs  . Current medications and supplements . Functional ability and status . Nutritional status . Physical activity . Advanced directives . List of other physicians . Hospitalizations, surgeries, and ER visits in previous 12 months . Vitals . Screenings to include cognitive, depression, and falls . Referrals and appointments  In addition, I have reviewed and discussed with patient certain preventive protocols, quality metrics, and best practice recommendations. A written personalized care plan for preventive services as well as general preventive health recommendations were provided to patient.   Due to this being a telephonic visit, the after visit summary with patients personalized plan was offered to patient via office or my-chart.  Patient preferred to pick up at office at next visit or via mychart.   Byard, Carranza, LPN   [id number]

## 2020-06-12 ENCOUNTER — Other Ambulatory Visit: Payer: Self-pay

## 2020-06-13 ENCOUNTER — Encounter: Payer: Self-pay | Admitting: Family Medicine

## 2020-06-13 ENCOUNTER — Ambulatory Visit (INDEPENDENT_AMBULATORY_CARE_PROVIDER_SITE_OTHER): Payer: Medicare PPO | Admitting: Family Medicine

## 2020-06-13 VITALS — BP 120/60 | HR 44 | Temp 98.2°F | Ht 71.25 in | Wt 177.5 lb

## 2020-06-13 DIAGNOSIS — N1832 Chronic kidney disease, stage 3b: Secondary | ICD-10-CM | POA: Diagnosis not present

## 2020-06-13 DIAGNOSIS — I152 Hypertension secondary to endocrine disorders: Secondary | ICD-10-CM

## 2020-06-13 DIAGNOSIS — Z8673 Personal history of transient ischemic attack (TIA), and cerebral infarction without residual deficits: Secondary | ICD-10-CM | POA: Diagnosis not present

## 2020-06-13 DIAGNOSIS — Z0000 Encounter for general adult medical examination without abnormal findings: Secondary | ICD-10-CM | POA: Diagnosis not present

## 2020-06-13 DIAGNOSIS — E785 Hyperlipidemia, unspecified: Secondary | ICD-10-CM | POA: Diagnosis not present

## 2020-06-13 DIAGNOSIS — E1159 Type 2 diabetes mellitus with other circulatory complications: Secondary | ICD-10-CM

## 2020-06-13 DIAGNOSIS — E1169 Type 2 diabetes mellitus with other specified complication: Secondary | ICD-10-CM | POA: Diagnosis not present

## 2020-06-13 DIAGNOSIS — I5020 Unspecified systolic (congestive) heart failure: Secondary | ICD-10-CM

## 2020-06-13 DIAGNOSIS — I429 Cardiomyopathy, unspecified: Secondary | ICD-10-CM | POA: Diagnosis not present

## 2020-06-13 LAB — HM DIABETES FOOT EXAM

## 2020-06-13 NOTE — Progress Notes (Signed)
Patient ID: Dustin Harrison, male    DOB: [birth year], 87 y.o.   MRN: [id number]  This visit was conducted in person.  BP 120/60   Pulse (!) 44   Temp 98.2 F (36.8 C) (Temporal)   Ht 5' 11.25" (1.81 m)   Wt 177 lb 8 oz (80.5 kg)   SpO2 95%   BMI 24.58 kg/m    CC:  Annual  Subjective:   HPI: Dustin Harrison is a 87 y.o. male presenting on 06/13/2020 for Annual Exam (Part 2)  The patient presents for complete physical and review of chronic health problems. He/She also has the following acute concerns today:  None   The patient saw a LPN or RN for medicare wellness visit.  Prevention and wellness was reviewed in detail. Note reviewed and important notes copied below. Health Maintenance: No gaps noted   Abnormal Screenings: None  06/13/20    He had CVA in 10/2019.. was not on aspirin at that time.  He never started back on this.  Diabetes:  Good control on Jardiance 10 mg daily and metfomrin 500 mg BID Lab Results  Component Value Date   HGBA1C 6.4 05/28/2020  Using medications without difficulties: Hypoglycemic episodes: Hyperglycemic episodes: Feet problems: no ulcers Blood Sugars averaging: not checking. eye exam within last year: yes  Elevated Cholesterol: LDL at goal 71 on atorvastatin 20 mg daily Using medications without problems:none Muscle aches: none Diet compliance: eating better on  Remeron.. this is stimulating appetite. Exercise: going to pool 2 days a week Other complaints:  Hypertension:   At goal on amlodipine 5 mg qd, coreg 6.25 mg BID, clonidine patch weekly 0.1, losartan 100 mg daily  BP Readings from Last 3 Encounters:  06/13/20 120/60  03/20/20 116/72  02/15/20 (!) 150/80  Using medication without problems or lightheadedness: none Chest pain with exertion:none Edema: Euvolemic in office today Short of breath:  Mild , improved Average home BPs: Other issues:  Followed by cardiology for cardiomyopathy and HFrEF.   Wt Readings  from Last 3 Encounters:  06/13/20 177 lb 8 oz (80.5 kg)  03/20/20 175 lb (79.4 kg)  02/15/20 174 lb (78.9 kg)    CKD.Marland Kitchen slight decrease in last  3 months but stable from 03/2020. Using meloxicam.     Relevant past medical, surgical, family and social history reviewed and updated as indicated. Interim medical history since our last visit reviewed. Allergies and medications reviewed and updated. Outpatient Medications Prior to Visit  Medication Sig Dispense Refill  . amLODipine (NORVASC) 5 MG tablet TAKE 1 TABLET BY MOUTH DAILY 30 tablet 3  . atorvastatin (LIPITOR) 20 MG tablet TAKE ONE TABLET EVERY DAY 97 tablet 0  . carvedilol (COREG) 6.25 MG tablet Take 0.5 tablets (3.125 mg total) by mouth in the morning and at bedtime. 90 tablet 1  . cloNIDine (CATAPRES - DOSED IN MG/24 HR) 0.1 mg/24hr patch 0.1 mg once a week.    . empagliflozin (JARDIANCE) 10 MG TABS tablet Take 1 tablet (10 mg total) by mouth daily before breakfast. 30 tablet 11  . finasteride (PROSCAR) 5 MG tablet TAKE ONE TABLET EVERY DAY 90 tablet 1  . latanoprost (XALATAN) 0.005 % ophthalmic solution Place 1 drop into both eyes at bedtime.    . lidocaine (LIDODERM) 5 % Place 1 patch onto the skin at bedtime. Remove & Discard patch within 12 hours or as directed by MD    . losartan (COZAAR) 100 MG tablet TAKE ONE TABLET  EVERY DAY 90 tablet 1  . meloxicam (MOBIC) 7.5 MG tablet TAKE ONE TABLET BY MOUTH EVERY MORNING AND AT BEDTIME 180 tablet 0  . metFORMIN (GLUCOPHAGE) 500 MG tablet TAKE ONE TABLET BY MOUTH TWICE DAILY WITH A MEAL 180 tablet 1  . mirtazapine (REMERON) 15 MG tablet TAKE ONE TABLET BY MOUTH AT BEDTIME 90 tablet 1  . Multiple Vitamins-Minerals (MULTIVITAMIN WITH MINERALS) tablet Take 1 tablet by mouth daily.    . pantoprazole (PROTONIX) 20 MG tablet Take 1 tablet (20 mg total) by mouth daily. 30 tablet 6  . polyethylene glycol powder (GLYCOLAX/MIRALAX) 17 GM/SCOOP powder Take 1 Container by mouth daily as needed for mild  constipation or moderate constipation.     Marland Kitchen spironolactone (ALDACTONE) 25 MG tablet TAKE 1/2 TABLET EVERYDAY 45 tablet 0  . tamsulosin (FLOMAX) 0.4 MG CAPS capsule TAKE 2 CAPSULES EVERY DAY 180 capsule 3  . timolol (TIMOPTIC) 0.5 % ophthalmic solution Place 1 drop into both eyes 2 (two) times daily.      No facility-administered medications prior to visit.     Per HPI unless specifically indicated in ROS section below Review of Systems  Constitutional: Negative for fatigue and fever.  HENT: Negative for ear pain.   Eyes: Negative for pain.  Respiratory: Positive for shortness of breath. Negative for cough.   Cardiovascular: Negative for chest pain, palpitations and leg swelling.  Gastrointestinal: Negative for abdominal pain.  Genitourinary: Negative for dysuria.  Musculoskeletal: Negative for arthralgias.  Neurological: Negative for syncope, light-headedness and headaches.  Psychiatric/Behavioral: Negative for dysphoric mood.   Objective:  BP 120/60   Pulse (!) 44   Temp 98.2 F (36.8 C) (Temporal)   Ht 5' 11.25" (1.81 m)   Wt 177 lb 8 oz (80.5 kg)   SpO2 95%   BMI 24.58 kg/m   Wt Readings from Last 3 Encounters:  06/13/20 177 lb 8 oz (80.5 kg)  03/20/20 175 lb (79.4 kg)  02/15/20 174 lb (78.9 kg)      Physical Exam Constitutional:      General: Vital signs are normal.     Appearance: He is well-developed and well-nourished.  HENT:     Head: Normocephalic.     Right Ear: Hearing normal.     Left Ear: Hearing normal.     Nose: Nose normal.     Mouth/Throat:     Mouth: Oropharynx is clear and moist and mucous membranes are normal.  Neck:     Thyroid: No thyroid mass or thyromegaly.     Vascular: No carotid bruit.     Trachea: Trachea normal.  Cardiovascular:     Rate and Rhythm: Normal rate and regular rhythm.     Pulses: Normal pulses.     Heart sounds: Heart sounds not distant. No murmur heard. No friction rub. No gallop.      Comments: No peripheral  edema Pulmonary:     Effort: Pulmonary effort is normal. No respiratory distress.     Breath sounds: Normal breath sounds.  Skin:    General: Skin is warm, dry and intact.     Findings: No rash.  Psychiatric:        Mood and Affect: Mood and affect normal.        Speech: Speech normal.        Behavior: Behavior normal.        Thought Content: Thought content normal.      Diabetic foot exam: Normal inspection No skin breakdown No calluses  Normal DP pulses Normal sensation to light touch and monofilament Nails normal     Results for orders placed or performed in visit on 05/28/20  Hemoglobin A1c  Result Value Ref Range   Hgb A1c MFr Bld 6.4 4.6 - 6.5 %  Lipid panel  Result Value Ref Range   Cholesterol 170 0 - 200 mg/dL   Triglycerides 49.0 0.0 - 149.0 mg/dL   HDL 89.30 >39.00 mg/dL   VLDL 9.8 0.0 - 40.0 mg/dL   LDL Cholesterol 71 0 - 99 mg/dL   Total CHOL/HDL Ratio 2    NonHDL 80.51   Comprehensive metabolic panel  Result Value Ref Range   Sodium 138 135 - 145 mEq/L   Potassium 4.7 3.5 - 5.1 mEq/L   Chloride 103 96 - 112 mEq/L   CO2 28 19 - 32 mEq/L   Glucose, Bld 127 (H) 70 - 99 mg/dL   BUN 44 (H) 6 - 23 mg/dL   Creatinine, Ser 1.64 (H) 0.40 - 1.50 mg/dL   Total Bilirubin 0.7 0.2 - 1.2 mg/dL   Alkaline Phosphatase 70 39 - 117 U/L   AST 25 0 - 37 U/L   ALT 32 0 - 53 U/L   Total Protein 7.3 6.0 - 8.3 g/dL   Albumin 4.5 3.5 - 5.2 g/dL   GFR 37.67 (L) >60.00 mL/min   Calcium 9.8 8.4 - 10.5 mg/dL    This visit occurred during the SARS-CoV-2 public health emergency.  Safety protocols were in place, including screening questions prior to the visit, additional usage of staff PPE, and extensive cleaning of exam room while observing appropriate contact time as indicated for disinfecting solutions.   COVID 19 screen:  No recent travel or known exposure to COVID19 The patient denies respiratory symptoms of COVID 19 at this time. The importance of social distancing was  discussed today.   Assessment and Plan The patient's preventative maintenance and recommended screening tests for an annual wellness exam were reviewed in full today. Brought up to date unless services declined.  Counselled on the importance of diet, exercise, and its role in overall health and mortality. The patient's FH and SH was reviewed, including their home life, tobacco status, and drug and alcohol status.    Will Stop PSA.Marland Kitchen pt is agreeable.  Uptodate with vaccines including COVID x3, TDap, flu, PNA and shingrix. No other prevention indicated given age.  Problem List Items Addressed This Visit    Cardiomyopathy ([place]) (Chronic)    Followed by Cardiology      CKD (chronic kidney disease) stage 3, GFR 30-59 ml/min (HCC) (Chronic)    slight decrease in last  3 months but stable from 03/2020. Using meloxicam.  Encouraged keeping up with liquid intake and avoid NSAIDs as much as able.      Diabetes mellitus with cardiac complication (HCC) (Chronic)    Good control on Jardiance 10 mg daily and metfomrin 500 mg BID      HFrEF (heart failure with reduced ejection fraction) (HCC) (Chronic)    Euvolemic in office today on  spironolactone as diuretic.      History of CVA (cerebrovascular accident)   Hyperlipidemia associated with type 2 diabetes mellitus (HCC) (Chronic)    Stable, chronic.  Continue current medication.   LDL at goal 71 on atorvastatin 20 mg daily      Hypertension associated with diabetes ([place]) (Chronic)    At goal on amlodipine 5 mg qd, coreg 6.25 mg BID, clonidine patch weekly 0.1, losartan  100 mg daily        Other Visit Diagnoses    Routine general medical examination at a health care facility    -  Primary      Orders Placed This Encounter  Procedures  . [facility] DIABETES FOOT EXAM    This external order was created through the Results Console.    Eliezer Lofts, MD

## 2020-06-13 NOTE — Patient Instructions (Addendum)
Restart baby aspirin given CVA last year.  Decrease meloxicam to once daily in morning or afternoon. If pain still well controlled.. can change to as needed.  See dermatologist about lesion on right external ear... sooner if lesion changing.

## 2020-06-18 DIAGNOSIS — D485 Neoplasm of uncertain behavior of skin: Secondary | ICD-10-CM | POA: Diagnosis not present

## 2020-06-18 DIAGNOSIS — R208 Other disturbances of skin sensation: Secondary | ICD-10-CM | POA: Diagnosis not present

## 2020-06-18 DIAGNOSIS — D0421 Carcinoma in situ of skin of right ear and external auricular canal: Secondary | ICD-10-CM | POA: Diagnosis not present

## 2020-06-20 ENCOUNTER — Other Ambulatory Visit: Payer: Self-pay | Admitting: Family Medicine

## 2020-06-20 ENCOUNTER — Telehealth: Payer: Self-pay | Admitting: Cardiovascular Disease

## 2020-06-20 NOTE — Telephone Encounter (Signed)
  Patient Consent for Virtual Visit         Dustin Harrison has provided verbal consent on 06/20/2020 for a virtual visit (video or telephone).   CONSENT FOR VIRTUAL VISIT FOR:  Dustin Harrison  By participating in this virtual visit I agree to the following:  I hereby voluntarily request, consent and authorize [facility] and its employed or contracted physicians, physician assistants, nurse practitioners or other licensed health care professionals (the Practitioner), to provide me with telemedicine health care services (the "Services") as deemed necessary by the treating Practitioner. I acknowledge and consent to receive the Services by the Practitioner via telemedicine. I understand that the telemedicine visit will involve communicating with the Practitioner through live audiovisual communication technology and the disclosure of certain medical information by electronic transmission. I acknowledge that I have been given the opportunity to request an in-person assessment or other available alternative prior to the telemedicine visit and am voluntarily participating in the telemedicine visit.  I understand that I have the right to withhold or withdraw my consent to the use of telemedicine in the course of my care at any time, without affecting my right to future care or treatment, and that the Practitioner or I may terminate the telemedicine visit at any time. I understand that I have the right to inspect all information obtained and/or recorded in the course of the telemedicine visit and may receive copies of available information for a reasonable fee.  I understand that some of the potential risks of receiving the Services via telemedicine include:  Marland Kitchen Delay or interruption in medical evaluation due to technological equipment failure or disruption; . Information transmitted may not be sufficient (e.g. poor resolution of images) to allow for appropriate medical decision making by the  Practitioner; and/or  . In rare instances, security protocols could fail, causing a breach of personal health information.  Furthermore, I acknowledge that it is my responsibility to provide information about my medical history, conditions and care that is complete and accurate to the best of my ability. I acknowledge that Practitioner's advice, recommendations, and/or decision may be based on factors not within their control, such as incomplete or inaccurate data provided by me or distortions of diagnostic images or specimens that may result from electronic transmissions. I understand that the practice of medicine is not an exact science and that Practitioner makes no warranties or guarantees regarding treatment outcomes. I acknowledge that a copy of this consent can be made available to me via my patient portal ([facility]), or I can request a printed copy by calling the office of [facility].    I understand that my insurance will be billed for this visit.   I have read or had this consent read to me. . I understand the contents of this consent, which adequately explains the benefits and risks of the Services being provided via telemedicine.  . I have been provided ample opportunity to ask questions regarding this consent and the Services and have had my questions answered to my satisfaction. . I give my informed consent for the services to be provided through the use of telemedicine in my medical care

## 2020-06-22 NOTE — Progress Notes (Signed)
Virtual Visit via Video Note   This visit type was conducted due to national recommendations for restrictions regarding the COVID-19 Pandemic (e.g. social distancing) in an effort to limit this patient's exposure and mitigate transmission in our community.  Due to his co-morbid illnesses, this patient is at least at moderate risk for complications without adequate follow up.  This format is felt to be most appropriate for this patient at this time.  All issues noted in this document were discussed and addressed.  A limited physical exam was performed with this format.  Please refer to the patient's chart for his consent to telehealth for [facility]   I connected with  Dustin Harrison on 06/23/20 by a video enabled telemedicine application and verified that I am speaking with the correct person using two identifiers. I am contacting the patient above from our cardiology clinic office or alternate office work station to their home, I discussed the limitations of evaluation and management by telemedicine. The patient expressed understanding and agreed to proceed.   Evaluation Performed:  Follow-up visit  Date:  06/23/2020   ID:  Dustin Harrison, DOB December 30, 1931, MRN [id number]  Patient Location:  [address] [place] [address]   Provider location:   [facility], [place] office  PCP:  Excell Seltzer, MD  Cardiologist:  Fonnie Mu   Chief Complaint  Patient presents with  . 4 month follow up    "doing well." Medications reviewed by the patient verbally.     History of Present Illness:    Dustin Harrison is a 87 y.o. male who presents via audio/video conferencing for a telehealth visit today.   The patient does not symptoms concerning for COVID-19 infection (fever, chills, cough, or new SHORTNESS OF BREATH).   Patient has a past medical history of  HFrEF, bradycardia, h/o pheochromocytoma, R adrenal tumor s/p R adrenalectomy, cochlear  implant on left 06/2018,  Dm2,  chronic back pain,  bacteremia 10/2019 s/p TEE and IV abx,  CKD,  CAD,  aortic stenosis,  syncope  Who presents for f/u of his cardiomyopathy, syncope,  CAD, aortic valve sclerosis/possible stenosis as seen on TEE 10/2019  prior hx reviewed in detail Evaluated while admitted 10/09/19 in setting of positive cultures for GBS bacteremia. Source of infection not identified.   TEE with LVEF 25-35%, gr1DD, significant wall motion abnormalities with images suggestive of stress induced CM versus LAD infarct.  TEE with no evidence of vegetation/endocarditis   He was started on feeding tube due to R vocal cord paralysis. Noted 2.2cm intraluminal density which would require further GI workup.   Seen in clinic 11/26/19 noting recent syncopal episode with LOC for 10 seconds.   Cardiac cath 11/28/19 with severe single-vessel coronary disease with 90% ostial stenosis involving small ramus intermedius that fills via right-to-left collaterals, mild to moderate nonobstructive CAD involving LAD, LCx, RCA, upper normal to mildly elevated L/R heart filling pressures, mild to moderate AS.   Seen in clinic 12/11/19 after he had transitioned back to independent living at [facility]. 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 His feeding tube had been removed and he was working with speech therapy. He was started on Spironolactone 12.5mg  daily.   He was seen in clinic 01/09/2020.   echocardiogram 01/14/2020 with EF  normalized with LVEF 60 to 65%, no RWMA, mild LVH, RV normal size and function, trivial MR, mild dilation of  ascending aorta 39 mm.   no shortness of breath nor dyspnea on exertion.   no chest pain, pressure, or tightness.  No edema, orthopnea, PND. Reports no palpitations.    no lightheadedness, dizziness, near-syncope, syncope.  Blood pressure high 135/85, sometimes 120  Rare orthostasis Uses walker No falls  does exercise  program Swimming  HGBA1C 6.4  EKGs/Labs/Other Studies Reviewed:   The following studies were reviewed today:  [facility] 11/28/19  1. Severe single-vessel coronary artery disease with 90% ostial stenosis involving small ramus intermedius branch that also fills via right-to-left collaterals. 2. Mild to moderate, non-obstructive coronary artery disease involving the LAD, LCx, and RCA. 3. Upper normal to mildly elevated left and right heart filling pressures. 4. Hyperdynamic left ventricular contraction. 5. Mild to moderate aortic stenosis.  1.   TTE 10/08/19 1. Left ventricular ejection fraction, by estimation, is 25 to 30%. The  left ventricle has severely decreased function. The left ventricle has no  regional wall motion abnormalities. There is moderate left ventricular  hypertrophy. Left ventricular  diastolic parameters are consistent with Grade I diastolic dysfunction  (impaired relaxation). There is severe akinesis of the left ventricular,  mid-apical anteroseptal wall, anterolateral wall, anterior segment, apical  segment and inferoseptal wall. Wall  motion abnormalities are suggestive of stress induced cardiomyopathy vs.  LAD infarct.  2. Right ventricular systolic function is normal. The right ventricular  size is normal. There is mildly elevated pulmonary artery systolic  pressure.  3. Left atrial size was mildly dilated.  4. The mitral valve is normal in structure. No evidence of mitral valve  regurgitation. No evidence of mitral stenosis.  5. The aortic valve is normal in structure. Aortic valve regurgitation is  not visualized. Mild to moderate aortic valve sclerosis/calcification is  present, without any evidence of aortic stenosis.  6. The inferior vena cava is normal in size with greater than 50%  respiratory variability, suggesting right atrial pressure of 3 mmHg.  7. No clear vegetations but suboptimal study.  TEE 10/12/19 1. Left ventricular  ejection fraction, by estimation, is 25 to 30%. The  left ventricle has severely decreased function. The left ventricle  demonstrates global hypokinesis.  2. Right ventricular systolic function is normal. The right ventricular  size is normal. Tricuspid regurgitation signal is inadequate for assessing  PA pressure.  3. The mitral valve is normal in structure. No evidence of mitral valve  regurgitation.  4. Aortic valve regurgitation is not visualized. Severe calcification of  aortic valve. Visually, there appears to be moderate aortic valve  stenosis.  5. Grossly, no valve vegetation noted.   Past Medical History:  Diagnosis Date  . Abnormal glucose   . Arthritis   . Baker's cyst of knee    left  . BPH (benign prostatic hypertrophy)   . Cough    because of "tight" esophagus  . Diabetes mellitus   . Glaucoma   . HOH (hard of hearing)   . Hypertension   . Pheochromocytoma of right adrenal gland   . Stroke (HCC)   . Ulcer   . Wears hearing aid    bilateral   Past Surgical History:  Procedure Laterality Date  . adrenaletomy    . CARDIAC CATHETERIZATION    . CATARACT EXTRACTION W/PHACO Left 10/08/2015   Procedure: CATARACT EXTRACTION PHACO AND INTRAOCULAR LENS PLACEMENT (IOC) left eye;  Surgeon: Lockie Mola, MD;  Location: [facility];  Service: Ophthalmology;  Laterality: Left;  MALYUGIN  . HERNIA REPAIR    .  RIGHT/LEFT HEART CATH AND CORONARY ANGIOGRAPHY Left 11/28/2019   Procedure: RIGHT/LEFT HEART CATH AND CORONARY ANGIOGRAPHY;  Surgeon: Yvonne Kendall, MD;  Location: [facility] INVASIVE CV LAB;  Service: Cardiovascular;  Laterality: Left;  . SQUAMOUS CELL CARCINOMA EXCISION  03-2006   left ear  . TEE WITHOUT CARDIOVERSION N/A 10/12/2019   Procedure: TRANSESOPHAGEAL ECHOCARDIOGRAM (TEE);  Surgeon: Antonieta Iba, MD;  Location: [facility] ORS;  Service: Cardiovascular;  Laterality: N/A;  . TONSILLECTOMY    . XI ROBOTIC LAPAROSCOPIC ASSISTED APPENDECTOMY N/A  10/11/2019   Procedure: XI ROBOTIC ASSISTED LAPAROSCOPIC INSERTION GASTROSTOMY TUBE;  Surgeon: Leafy Ro, MD;  Location: [facility] ORS;  Service: General;  Laterality: N/A;      Allergies:   Patient has no known allergies.   Social History   Tobacco Use  . Smoking status: Never Smoker  . Smokeless tobacco: Never Used  Vaping Use  . Vaping Use: Never used  Substance Use Topics  . Alcohol use: Yes    Alcohol/week: 1.0 standard drink    Types: 1 Glasses of wine per week    Comment: 1 glass per week  . Drug use: No     Current Outpatient Medications on File Prior to Visit  Medication Sig Dispense Refill  . amLODipine (NORVASC) 5 MG tablet Take 2.5 mg by mouth in the morning and at bedtime.    Marland Kitchen aspirin (ASPIRIN 81) 81 MG EC tablet Take 81 mg by mouth daily. Swallow whole.    Marland Kitchen atorvastatin (LIPITOR) 20 MG tablet TAKE ONE TABLET EVERY DAY 97 tablet 0  . carvedilol (COREG) 6.25 MG tablet Take 0.5 tablets (3.125 mg total) by mouth in the morning and at bedtime. 90 tablet 1  . empagliflozin (JARDIANCE) 10 MG TABS tablet Take 1 tablet (10 mg total) by mouth daily before breakfast. 30 tablet 11  . finasteride (PROSCAR) 5 MG tablet TAKE ONE TABLET EVERY DAY 90 tablet 1  . latanoprost (XALATAN) 0.005 % ophthalmic solution Place 1 drop into both eyes at bedtime.    Marland Kitchen losartan (COZAAR) 100 MG tablet TAKE ONE TABLET EVERY DAY 90 tablet 1  . meloxicam (MOBIC) 7.5 MG tablet TAKE ONE TABLET BY MOUTH EVERY MORNING AND AT BEDTIME 180 tablet 0  . metFORMIN (GLUCOPHAGE) 500 MG tablet TAKE ONE TABLET BY MOUTH TWICE DAILY WITH A MEAL 180 tablet 1  . mirtazapine (REMERON) 15 MG tablet TAKE ONE TABLET BY MOUTH AT BEDTIME 90 tablet 1  . Multiple Vitamins-Minerals (MULTIVITAMIN WITH MINERALS) tablet Take 1 tablet by mouth daily.    . pantoprazole (PROTONIX) 20 MG tablet TAKE 1 TABLET BY MOUTH DAILY 90 tablet 3  . polyethylene glycol powder (GLYCOLAX/MIRALAX) 17 GM/SCOOP powder Take 1 Container by mouth daily  as needed for mild constipation or moderate constipation.     Marland Kitchen spironolactone (ALDACTONE) 25 MG tablet TAKE 1/2 TABLET EVERYDAY 45 tablet 0  . tamsulosin (FLOMAX) 0.4 MG CAPS capsule TAKE 2 CAPSULES EVERY DAY 180 capsule 3  . timolol (TIMOPTIC) 0.5 % ophthalmic solution Place 1 drop into both eyes 2 (two) times daily.      No current facility-administered medications on file prior to visit.     Family Hx: The patient's family history includes Alcohol abuse in his mother; Brain cancer in his father; Coronary artery disease in his mother; Diabetes in his brother.  ROS:   Please see the history of present illness.    Review of Systems  Constitutional: Negative.   HENT: Negative.   Respiratory: Negative.  Cardiovascular: Negative.   Gastrointestinal: Negative.   Musculoskeletal: Negative.   Neurological: Negative.   Psychiatric/Behavioral: Negative.   All other systems reviewed and are negative.     Labs/Other Tests and Data Reviewed:    Recent Labs: 10/14/2019: B Natriuretic Peptide 1,273.0 10/15/2019: Magnesium 2.4 03/13/2020: Hemoglobin 13.2; Platelets 274.0 05/28/2020: ALT 32; BUN 44; Creatinine, Ser 1.64; Potassium 4.7; Sodium 138   Recent Lipid Panel Lab Results  Component Value Date/Time   CHOL 170 05/28/2020 07:53 AM   TRIG 49.0 05/28/2020 07:53 AM   HDL 89.30 05/28/2020 07:53 AM   CHOLHDL 2 05/28/2020 07:53 AM   LDLCALC 71 05/28/2020 07:53 AM   LDLDIRECT 126.2 04/05/2013 07:39 AM    Wt Readings from Last 3 Encounters:  06/23/20 175 lb (79.4 kg)  06/13/20 177 lb 8 oz (80.5 kg)  03/20/20 175 lb (79.4 kg)     Exam:    Vital Signs: Vital signs may also be detailed in the HPI BP 135/85   Pulse (!) 54   Ht 5\' 11"  (1.803 m)   Wt 175 lb (79.4 kg)   BMI 24.41 kg/m   Wt Readings from Last 3 Encounters:  06/23/20 175 lb (79.4 kg)  06/13/20 177 lb 8 oz (80.5 kg)  03/20/20 175 lb (79.4 kg)   Temp Readings from Last 3 Encounters:  06/13/20 98.2 F (36.8 C)  (Temporal)  03/20/20 98.6 F (37 C)  01/18/20 (!) 97.4 F (36.3 C) (Temporal)   BP Readings from Last 3 Encounters:  06/23/20 135/85  06/13/20 120/60  03/20/20 116/72   Pulse Readings from Last 3 Encounters:  06/23/20 (!) 54  06/13/20 (!) 44  03/20/20 (!) 50     Well nourished, well developed male in no acute distress. Constitutional:  oriented to person, place, and time. No distress.  Head: Normocephalic and atraumatic.  Neurological:   Full exam not performed Psychiatric:  normal mood and affect. behavior is normal. Thought content normal.    ASSESSMENT & PLAN:    Problem List Items Addressed This Visit      Cardiology Problems   Diabetes mellitus with cardiac complication (HCC) (Chronic)   Relevant Medications   amLODipine (NORVASC) 5 MG tablet   aspirin (ASPIRIN 81) 81 MG EC tablet   Cardiomyopathy (HCC) (Chronic)   Relevant Medications   amLODipine (NORVASC) 5 MG tablet   aspirin (ASPIRIN 81) 81 MG EC tablet   HFrEF (heart failure with reduced ejection fraction) (HCC) (Chronic)   Relevant Medications   amLODipine (NORVASC) 5 MG tablet   aspirin (ASPIRIN 81) 81 MG EC tablet    Other Visit Diagnoses    Coronary artery disease of native artery of native heart with stable angina pectoris (HCC)    -  Primary   Relevant Medications   amLODipine (NORVASC) 5 MG tablet   aspirin (ASPIRIN 81) 81 MG EC tablet   Moderate aortic stenosis       Relevant Medications   amLODipine (NORVASC) 5 MG tablet   aspirin (ASPIRIN 81) 81 MG EC tablet   Essential hypertension       Relevant Medications   amLODipine (NORVASC) 5 MG tablet   aspirin (ASPIRIN 81) 81 MG EC tablet   Bradycardia       Hyperlipidemia LDL goal <70       Relevant Medications   amLODipine (NORVASC) 5 MG tablet   aspirin (ASPIRIN 81) 81 MG EC tablet   Essential hypertension, benign       Relevant Medications  amLODipine (NORVASC) 5 MG tablet   aspirin (ASPIRIN 81) 81 MG EC tablet     CAD with stable  angina Currently with no symptoms of angina. No further workup at this time. Continue current medication regimen. exercising  HTN Stable pressures 120 to 130 systolic Rare orthostasis, in setting low low pulse, No changes made Hyperlipidemia Total chol above goal, He does not medication changes at this time  Aortic valve stenosis Monitor for now, no symptoms  Bradycardia: Denies sx, "chronic" he reports For worsening sx, would stop the coreg  Bacteremia: Recovered well, exercising, Uses a walker some times   COVID-19 Education: The signs and symptoms of COVID-19 were discussed with the patient and how to seek care for testing (follow up with PCP or arrange E-visit).  The importance of social distancing was discussed today.  Patient Risk:   After full review of this patients clinical status, I feel that they are at least moderate risk at this time.  Time:   Today, I have spent 25 minutes with the patient with telehealth technology discussing the cardiac and medical problems/diagnoses detailed above   Additional 10 min spent reviewing the chart prior to patient visit today   Medication Adjustments/Labs and Tests Ordered: Current medicines are reviewed at length with the patient today.  Concerns regarding medicines are outlined above.   Tests Ordered: No tests ordered   Medication Changes: No changes made     Signed, Julien Nordmann, MD  [facility] [address], [place], [place] [address]

## 2020-06-23 ENCOUNTER — Other Ambulatory Visit: Payer: Self-pay

## 2020-06-23 ENCOUNTER — Ambulatory Visit: Payer: Medicare PPO | Admitting: Cardiovascular Disease

## 2020-06-23 ENCOUNTER — Encounter: Payer: Self-pay | Admitting: Cardiovascular Disease

## 2020-06-23 ENCOUNTER — Telehealth (INDEPENDENT_AMBULATORY_CARE_PROVIDER_SITE_OTHER): Payer: Medicare PPO | Admitting: Cardiovascular Disease

## 2020-06-23 VITALS — BP 135/85 | HR 54 | Ht 71.0 in | Wt 175.0 lb

## 2020-06-23 DIAGNOSIS — E785 Hyperlipidemia, unspecified: Secondary | ICD-10-CM

## 2020-06-23 DIAGNOSIS — I350 Nonrheumatic aortic (valve) stenosis: Secondary | ICD-10-CM | POA: Diagnosis not present

## 2020-06-23 DIAGNOSIS — R001 Bradycardia, unspecified: Secondary | ICD-10-CM

## 2020-06-23 DIAGNOSIS — I420 Dilated cardiomyopathy: Secondary | ICD-10-CM

## 2020-06-23 DIAGNOSIS — I5020 Unspecified systolic (congestive) heart failure: Secondary | ICD-10-CM | POA: Diagnosis not present

## 2020-06-23 DIAGNOSIS — I10 Essential (primary) hypertension: Secondary | ICD-10-CM | POA: Diagnosis not present

## 2020-06-23 DIAGNOSIS — I25118 Atherosclerotic heart disease of native coronary artery with other forms of angina pectoris: Secondary | ICD-10-CM

## 2020-06-23 DIAGNOSIS — E1159 Type 2 diabetes mellitus with other circulatory complications: Secondary | ICD-10-CM

## 2020-06-23 MED ORDER — SPIRONOLACTONE 25 MG PO TABS: ORAL_TABLET | ORAL | 3 refills | Status: DC

## 2020-06-23 MED ORDER — CARVEDILOL 6.25 MG PO TABS: 3.1250 mg | ORAL_TABLET | Freq: Two times a day (BID) | ORAL | 3 refills | Status: DC

## 2020-06-23 MED ORDER — LOSARTAN POTASSIUM 100 MG PO TABS: 100.0000 mg | ORAL_TABLET | Freq: Every day | ORAL | 3 refills | Status: DC

## 2020-06-23 MED ORDER — AMLODIPINE BESYLATE 5 MG PO TABS: 2.5000 mg | ORAL_TABLET | Freq: Two times a day (BID) | ORAL | 3 refills | Status: DC

## 2020-06-23 NOTE — Patient Instructions (Addendum)
Medication Instructions:  No changes All your cardiac medication were refilled at 90 day supply for one year, call pharmacy when refills are needed  Lab work: No new labs needed  Testing/Procedures: No new testing needed   Follow-Up: At [facility], you and your health needs are our priority.  As part of our continuing mission to provide you with exceptional heart care, we have created designated Provider Care Teams.  These Care Teams include your primary Cardiologist (physician) and Advanced Practice Providers (APPs -  Physician Assistants and Nurse Practitioners) who all work together to provide you with the care you need, when you need it.  . You will need a follow up appointment in 6 months, APP ok  . Providers on your designated Care Team:   . Murray Hodgkins, NP . Christell Faith, PA-C . Marrianne Mood, PA-C  Any Other Special Instructions Will Be Listed Below (If Applicable).  COVID-19 Vaccine Information can be found at: ShippingScam.co.uk For questions related to vaccine distribution or appointments, please email vaccine@[email or web address] .com or call [phone].

## 2020-06-27 ENCOUNTER — Telehealth: Payer: Self-pay

## 2020-06-27 NOTE — Telephone Encounter (Signed)
Patient came into office to drop off form to be completed by PCP, please give pt a call when form is complete and ready to be picked up.

## 2020-06-27 NOTE — Telephone Encounter (Signed)
Forms placed in Dr. Bedsole's office in box to complete. 

## 2020-07-03 DIAGNOSIS — D0421 Carcinoma in situ of skin of right ear and external auricular canal: Secondary | ICD-10-CM | POA: Diagnosis not present

## 2020-07-04 NOTE — Telephone Encounter (Signed)
Form completed and in outbox

## 2020-07-07 NOTE — Telephone Encounter (Signed)
Forms mailed to patient as requested.

## 2020-07-25 ENCOUNTER — Other Ambulatory Visit: Payer: Self-pay | Admitting: Family Medicine

## 2020-07-25 DIAGNOSIS — H401132 Primary open-angle glaucoma, bilateral, moderate stage: Secondary | ICD-10-CM | POA: Diagnosis not present

## 2020-08-03 NOTE — Assessment & Plan Note (Signed)
slight decrease in last  3 months but stable from 03/2020. Using meloxicam.  Encouraged keeping up with liquid intake and avoid NSAIDs as much as able.

## 2020-08-03 NOTE — Assessment & Plan Note (Signed)
Euvolemic in office today on  spironolactone as diuretic.

## 2020-08-03 NOTE — Assessment & Plan Note (Signed)
Stable, chronic.  Continue current medication.   LDL at goal 71 on atorvastatin 20 mg daily

## 2020-08-03 NOTE — Assessment & Plan Note (Signed)
Followed by Cardiology 

## 2020-08-03 NOTE — Assessment & Plan Note (Signed)
At goal on amlodipine 5 mg qd, coreg 6.25 mg BID, clonidine patch weekly 0.1, losartan 100 mg daily

## 2020-08-03 NOTE — Assessment & Plan Note (Signed)
Good control on Jardiance 10 mg daily and metfomrin 500 mg BID

## 2020-08-04 ENCOUNTER — Other Ambulatory Visit: Payer: Self-pay | Admitting: Family Medicine

## 2020-08-24 ENCOUNTER — Other Ambulatory Visit: Payer: Self-pay | Admitting: Family Medicine

## 2020-08-27 DIAGNOSIS — D2272 Melanocytic nevi of left lower limb, including hip: Secondary | ICD-10-CM | POA: Diagnosis not present

## 2020-08-27 DIAGNOSIS — L821 Other seborrheic keratosis: Secondary | ICD-10-CM | POA: Diagnosis not present

## 2020-08-27 DIAGNOSIS — X32XXXA Exposure to sunlight, initial encounter: Secondary | ICD-10-CM | POA: Diagnosis not present

## 2020-08-27 DIAGNOSIS — Z08 Encounter for follow-up examination after completed treatment for malignant neoplasm: Secondary | ICD-10-CM | POA: Diagnosis not present

## 2020-08-27 DIAGNOSIS — D2271 Melanocytic nevi of right lower limb, including hip: Secondary | ICD-10-CM | POA: Diagnosis not present

## 2020-08-27 DIAGNOSIS — D2262 Melanocytic nevi of left upper limb, including shoulder: Secondary | ICD-10-CM | POA: Diagnosis not present

## 2020-08-27 DIAGNOSIS — D2261 Melanocytic nevi of right upper limb, including shoulder: Secondary | ICD-10-CM | POA: Diagnosis not present

## 2020-08-27 DIAGNOSIS — Z85828 Personal history of other malignant neoplasm of skin: Secondary | ICD-10-CM | POA: Diagnosis not present

## 2020-08-27 DIAGNOSIS — L570 Actinic keratosis: Secondary | ICD-10-CM | POA: Diagnosis not present

## 2020-09-10 DIAGNOSIS — H903 Sensorineural hearing loss, bilateral: Secondary | ICD-10-CM | POA: Diagnosis not present

## 2020-09-15 ENCOUNTER — Encounter: Payer: Self-pay | Admitting: Family Medicine

## 2020-09-15 ENCOUNTER — Ambulatory Visit: Payer: Medicare PPO | Admitting: Family

## 2020-09-15 ENCOUNTER — Ambulatory Visit: Payer: Medicare PPO | Admitting: Family Medicine

## 2020-09-15 ENCOUNTER — Other Ambulatory Visit: Payer: Self-pay

## 2020-09-15 ENCOUNTER — Encounter: Payer: Self-pay | Admitting: Family

## 2020-09-15 ENCOUNTER — Ambulatory Visit (INDEPENDENT_AMBULATORY_CARE_PROVIDER_SITE_OTHER): Payer: Medicare PPO

## 2020-09-15 VITALS — BP 144/84 | HR 55 | Ht 71.0 in | Wt 183.0 lb

## 2020-09-15 VITALS — BP 90/60 | HR 62 | Temp 98.3°F | Ht 71.0 in | Wt 184.0 lb

## 2020-09-15 DIAGNOSIS — R002 Palpitations: Secondary | ICD-10-CM

## 2020-09-15 DIAGNOSIS — R42 Dizziness and giddiness: Secondary | ICD-10-CM

## 2020-09-15 DIAGNOSIS — R001 Bradycardia, unspecified: Secondary | ICD-10-CM | POA: Diagnosis not present

## 2020-09-15 DIAGNOSIS — I350 Nonrheumatic aortic (valve) stenosis: Secondary | ICD-10-CM

## 2020-09-15 DIAGNOSIS — L089 Local infection of the skin and subcutaneous tissue, unspecified: Secondary | ICD-10-CM | POA: Insufficient documentation

## 2020-09-15 DIAGNOSIS — I25118 Atherosclerotic heart disease of native coronary artery with other forms of angina pectoris: Secondary | ICD-10-CM

## 2020-09-15 DIAGNOSIS — I5020 Unspecified systolic (congestive) heart failure: Secondary | ICD-10-CM | POA: Diagnosis not present

## 2020-09-15 DIAGNOSIS — E1159 Type 2 diabetes mellitus with other circulatory complications: Secondary | ICD-10-CM

## 2020-09-15 DIAGNOSIS — E11628 Type 2 diabetes mellitus with other skin complications: Secondary | ICD-10-CM | POA: Diagnosis not present

## 2020-09-15 DIAGNOSIS — L97511 Non-pressure chronic ulcer of other part of right foot limited to breakdown of skin: Secondary | ICD-10-CM | POA: Diagnosis not present

## 2020-09-15 MED ORDER — AMOXICILLIN-POT CLAVULANATE 875-125 MG PO TABS: 1.0000 | ORAL_TABLET | Freq: Two times a day (BID) | ORAL | 0 refills | Status: DC

## 2020-09-15 NOTE — Assessment & Plan Note (Signed)
Erythema and skin breakdown concerning for early infection in pt with diabetes. Treat with augmentin. Return in 2 days for skin check with pcp. Had CBC w/ cardiology today will f/u results.

## 2020-09-15 NOTE — Progress Notes (Signed)
Office Visit    Patient Name: Dustin Harrison Date of Encounter: 09/15/2020  Primary Care Provider:  Jinny Sanders, MD Primary Cardiologist:  Ida Rogue, MD Electrophysiologist:  None   Chief Complaint    Dustin Harrison is a 87 y.o. male with a hx of HFrEF, bradycardia, h/o pheochromocytoma, R adrenal tumor s/p R adrenalectomy, cochlear implant on left 06/2018, Dm2, chronic back pain, recent bacteremia 10/2019 s/p TEE and IV abx, CKD, CAD, aortic stenosis, syncope presents today for lightheadedness and dizziness  Past Medical History    Past Medical History:  Diagnosis Date  . Abnormal glucose   . Arthritis   . Baker's cyst of knee    left  . BPH (benign prostatic hypertrophy)   . Cough    because of "tight" esophagus  . Diabetes mellitus   . Glaucoma   . HOH (hard of hearing)   . Hypertension   . Pheochromocytoma of right adrenal gland   . Stroke ([place])   . Ulcer   . Wears hearing aid    bilateral   Past Surgical History:  Procedure Laterality Date  . adrenaletomy    . CARDIAC CATHETERIZATION    . CATARACT EXTRACTION W/PHACO Left 10/08/2015   Procedure: CATARACT EXTRACTION PHACO AND INTRAOCULAR LENS PLACEMENT ([place]) left eye;  Surgeon: Leandrew Koyanagi, MD;  Location: [place];  Service: Ophthalmology;  Laterality: Left;  MALYUGIN  . HERNIA REPAIR    . RIGHT/LEFT HEART CATH AND CORONARY ANGIOGRAPHY Left 11/28/2019   Procedure: RIGHT/LEFT HEART CATH AND CORONARY ANGIOGRAPHY;  Surgeon: Nelva Bush, MD;  Location: [place] [facility];  Service: Cardiovascular;  Laterality: Left;  . SQUAMOUS CELL CARCINOMA EXCISION  03-2006   left ear  . TEE WITHOUT CARDIOVERSION N/A 10/12/2019   Procedure: TRANSESOPHAGEAL ECHOCARDIOGRAM (TEE);  Surgeon: Minna Merritts, MD;  Location: [facility];  Service: Cardiovascular;  Laterality: N/A;  . TONSILLECTOMY    . XI ROBOTIC LAPAROSCOPIC ASSISTED APPENDECTOMY N/A 10/11/2019   Procedure: XI ROBOTIC ASSISTED  LAPAROSCOPIC INSERTION GASTROSTOMY TUBE;  Surgeon: Jules Husbands, MD;  Location: [facility];  Service: General;  Laterality: N/A;    Allergies  No Known Allergies  History of Present Illness    Dustin Harrison is a 87 y.o. male with a hx of HFrEF, bradycardia, h/o pheochromocytoma, R adrenal tumor s/p R adrenalectomy, cochlear implant on left 06/2018, DM2, chronic back pain, recent bacteremia 10/2019 s/p TEE and IV abx, CKD, CAD, aortic stenosis, syncope. He was last seen 06/23/2020 by Dr. Rockey Situ via telemedicine.  Evaluated while admitted 10/09/19 in setting of positive cultures for GBS bacteremia. Source of infection not identified. TEE with LVEF 25-35%, gr1DD, significant wall motion abnormalities with images suggestive of stress induced CM versus LAD infarct. TEE with no evidence of vegetation/endocarditis and moderate AS not visualized on TTE. He was started on feeding tube due to R vocal cord paralysis. Noted 2.2cm intraluminal density which would require further GI workup.   Seen in clinic 11/26/19 noting recent syncopal episode with LOC for 10 seconds. Cardiac cath 11/28/19 with severe single-vessel coronary disease with 90% ostial stenosis involving small ramus intermedius that fills via right-to-left collaterals, mild to moderate nonobstructive CAD involving LAD, LCx, RCA, upper normal to mildly elevated L/R heart filling pressures, mild to moderate AS.   Seen in clinic 12/11/19 after he had transitioned back to independent living at [facility]. 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 His feeding  tube had been removed and he was working with speech therapy. He was started on Spironolactone 12.5mg  daily.   He was seen in clinic 01/09/2020.  He was doing well with NYHA I symptoms.  No changes were made at that time.  He was recommended for repeat echocardiogram.  Underwent echocardiogram 01/14/2020 with EF  normalized with LVEF 60 to 65%, no RWMA, mild LVH, RV normal size  and function, trivial MR, mild dilation of ascending aorta 39 mm.  At follow-up.  At follow-up September 2021 in January of this year he was noted to be doing well and no changes were made.  He presents today for follow up.  Continues to enjoy working out in the pool twice per week.  Endorses some dizziness and sensation of a spinning sensation and also lightheadedness. No near syncope nor syncope. Tells me this lightheadedness/dizziness is happening once per month. Does note some falls related to lightheadedness with no significant injury. Still participating in his exercise classes in the swimming pool twice per week. Tells me his energy level is about the same and will vary depending on how much sleep he was able to get the night prior.  Reports no chest pain, pressure, tightness.  Reports no shortness of breath, dyspnea on exertion, edema, orthopnea, PND.  Denies amaurosis fugax.   EKGs/Labs/Other Studies Reviewed:   The following studies were reviewed today:  [facility] 11/28/19 Conclusions: 1. Severe single-vessel coronary artery disease with 90% ostial stenosis involving small ramus intermedius branch that also fills via right-to-left collaterals. 2. Mild to moderate, non-obstructive coronary artery disease involving the LAD, LCx, and RCA. 3. Upper normal to mildly elevated left and right heart filling pressures. 4. Hyperdynamic left ventricular contraction. 5. Mild to moderate aortic stenosis.   Recommendations: 1. Continue medical therapy and secondary prevention of coronary artery disease. 2. Consider repeat echo to confirm LVEF and aortic stenosis.  TTE 10/08/19  1. Left ventricular ejection fraction, by estimation, is 25 to 30%. The  left ventricle has severely decreased function. The left ventricle has no  regional wall motion abnormalities. There is moderate left ventricular  hypertrophy. Left ventricular  diastolic parameters are consistent with Grade I diastolic dysfunction   (impaired relaxation). There is severe akinesis of the left ventricular,  mid-apical anteroseptal wall, anterolateral wall, anterior segment, apical  segment and inferoseptal wall. Wall   motion abnormalities are suggestive of stress induced cardiomyopathy vs.  LAD infarct.   2. Right ventricular systolic function is normal. The right ventricular  size is normal. There is mildly elevated pulmonary artery systolic  pressure.   3. Left atrial size was mildly dilated.   4. The mitral valve is normal in structure. No evidence of mitral valve  regurgitation. No evidence of mitral stenosis.   5. The aortic valve is normal in structure. Aortic valve regurgitation is  not visualized. Mild to moderate aortic valve sclerosis/calcification is  present, without any evidence of aortic stenosis.   6. The inferior vena cava is normal in size with greater than 50%  respiratory variability, suggesting right atrial pressure of 3 mmHg.   7. No clear vegetations but suboptimal study.    TEE 10/12/19  1. Left ventricular ejection fraction, by estimation, is 25 to 30%. The  left ventricle has severely decreased function. The left ventricle  demonstrates global hypokinesis.   2. Right ventricular systolic function is normal. The right ventricular  size is normal. Tricuspid regurgitation signal is inadequate for assessing  PA pressure.   3.  The mitral valve is normal in structure. No evidence of mitral valve  regurgitation.   4. Aortic valve regurgitation is not visualized. Severe calcification of  aortic valve. Visually, there appears to be moderate aortic valve  stenosis.   5. Grossly, no valve vegetation noted.    EKG:  EKG is  ordered today.  The ekg ordered today demonstrates sinus bradycardia 51 bpm.  Rhythm strip showed heart rate 50 bpm.  Recent Labs: 10/14/2019: B Natriuretic Peptide 1,273.0 10/15/2019: Magnesium 2.4 03/13/2020: Hemoglobin 13.2; Platelets 274.0 05/28/2020: ALT 32; BUN 44;  Creatinine, Ser 1.64; Potassium 4.7; Sodium 138  Recent Lipid Panel    Component Value Date/Time   CHOL 170 05/28/2020 0753   TRIG 49.0 05/28/2020 0753   HDL 89.30 05/28/2020 0753   CHOLHDL 2 05/28/2020 0753   VLDL 9.8 05/28/2020 0753   LDLCALC 71 05/28/2020 0753   LDLDIRECT 126.2 04/05/2013 0739    Home Medications   Current Meds  Medication Sig  . amLODipine (NORVASC) 5 MG tablet Take 0.5 tablets (2.5 mg total) by mouth in the morning and at bedtime.  Marland Kitchen aspirin 81 MG EC tablet Take 81 mg by mouth daily. Swallow whole.  Marland Kitchen atorvastatin (LIPITOR) 20 MG tablet TAKE ONE TABLET EVERY DAY  . carvedilol (COREG) 6.25 MG tablet Take 0.5 tablets (3.125 mg total) by mouth in the morning and at bedtime.  . empagliflozin (JARDIANCE) 10 MG TABS tablet Take 1 tablet (10 mg total) by mouth daily before breakfast.  . finasteride (PROSCAR) 5 MG tablet TAKE ONE TABLET EVERY DAY  . latanoprost (XALATAN) 0.005 % ophthalmic solution Place 1 drop into both eyes at bedtime.  Marland Kitchen losartan (COZAAR) 100 MG tablet Take 1 tablet (100 mg total) by mouth daily.  . meloxicam (MOBIC) 7.5 MG tablet TAKE ONE TABLET BY MOUTH EVERY MORNING AND AT BEDTIME  . metFORMIN (GLUCOPHAGE) 500 MG tablet TAKE ONE TABLET BY MOUTH TWICE DAILY WITH A MEAL  . mirtazapine (REMERON) 15 MG tablet TAKE ONE TABLET BY MOUTH AT BEDTIME  . Multiple Vitamins-Minerals (MULTIVITAMIN WITH MINERALS) tablet Take 1 tablet by mouth daily.  . pantoprazole (PROTONIX) 20 MG tablet TAKE 1 TABLET BY MOUTH DAILY  . polyethylene glycol powder (GLYCOLAX/MIRALAX) 17 GM/SCOOP powder Take 1 Container by mouth daily as needed for mild constipation or moderate constipation.   Marland Kitchen spironolactone (ALDACTONE) 25 MG tablet TAKE 1/2 TABLET EVERYDAY  . tamsulosin (FLOMAX) 0.4 MG CAPS capsule TAKE 2 CAPSULES EVERY DAY  . timolol (TIMOPTIC) 0.5 % ophthalmic solution Place 1 drop into both eyes 2 (two) times daily.     Review of Systems  All other systems reviewed and are  otherwise negative except as noted above.  Physical Exam   VS:  BP (!) 144/84 (BP Location: Left Arm, Patient Position: Sitting, Cuff Size: Normal)   Pulse (!) 55   Ht 5\' 11"  (1.803 m)   Wt 183 lb (83 kg)   SpO2 94%   BMI 25.52 kg/m  , BMI Body mass index is 25.52 kg/m. GEN: Well nourished, well developed, in no acute distress. HEENT: normal. Neck: Supple, no JVD, carotid bruits, or masses. Cardiac: RRR, bradycardic, gr 2/6 systolic murmur, no  rubs, or gallops. No clubbing, cyanosis, edema.  Radials/DP/PT 2+ and equal bilaterally.  Respiratory:  Respirations regular and unlabored, clear to auscultation bilaterally. GI: Soft, nontender, nondistended, MS: No deformity or atrophy. Skin: Warm and dry, no rash. Neuro:  Strength and sensation are intact. Psych: Normal affect.  Assessment & Plan  1. Lightheadedness/dizziness/orthostatic hypotension/syncope/bradycardia -previous orthostatic hypotension was improved with preventative measures such as remaining well-hydrated and slow position changes.  Reports since seen via telemedicine a few months ago he has had worsening lightheadedness and dizziness.  Denies near-syncope or syncope.  He has had falls related to lightheadedness.  We will elect to stop carvedilol due to bradycardia.  7-day ZIO monitor placed today to assess for any arrhythmia. BMP, CBC, TSH to rule out electrolyte abnormality, anemia, thyroid disorders contributory.  If ZIO unremarkable, consider echo though was performed within last year.Marland Kitchen  He denies symptoms of amaurosis fugax and we will defer carotid duplex at this time though it could be considered if the amount of markable.  2. CAD -No anginal symptoms.  Right and left heart cath 11/28/2019 with severe single-vessel CAD with 90% ostial stenosis involving small ramus intermedius branch that fills with right to left collaterals otherwise mild to moderate nonobstructive CAD in LAD, LCx, RCA. Recommended for medical  management. Continue aspirin, statin, beta blocker.   3. Aortic stenosis -Moderate by TEE 10/2019.  Mild to moderate by echo 11/28/2019.  Repeat echo 01/12/2020 with no evidence of aortic stenosis.  Educated on signs and symptoms of worsening aortic stenosis.  Continue optimal BP control.  If work-up unrevealing, as above, consider repeat echo.  4. HFrEF - Euvolemic and well compensated on exam.  EF has normalized to 60 to 65%.  GDMT includes Coreg, Losartan, Spironolactone. No indication for loop diuretic at this time.  Continue low-sodium, heartily diet.  Continue regular cardiovascular exercise.  NYHA I.  5. HTN -BP mildly elevated in clinic but reports it is well controlled at home.  He was provided a BP log and will bring it to his next appointment.  Some orthostasis could be contributory to his symptoms of lightheadedness and will discontinue Coreg, as above.  If he has episodes of hypotension we will need to further reduce his antihypertensive regimen.  6. HLD, LDL goal <70 -.  Continue atorvastatin 20 mg daily.   7. DM2 -continue to follow with PCP.  Disposition: Follow up in 6 week(s) with Dr. Rockey Situ or APP.   Loel Dubonnet, NP 09/15/2020, 3:18 PM

## 2020-09-15 NOTE — Patient Instructions (Signed)
Take the antibiotic  Return to see Dr. Diona Browner for follow-up in 2 days  If worsening redness, fever, chills - call  Place a small piece of gauze between the toes to help decrease the rubbing.

## 2020-09-15 NOTE — Assessment & Plan Note (Signed)
Advised continued neosporin and using gauze to decrease pressure from adjacent toe.

## 2020-09-15 NOTE — Assessment & Plan Note (Signed)
Lab Results  Component Value Date   HGBA1C 6.4 05/28/2020   Diabetes well controlled which is reassuring with current ulcer. Cont metformin and jardiance

## 2020-09-15 NOTE — Progress Notes (Signed)
Subjective:     Dustin Harrison is a 87 y.o. male presenting for Sore on Toe (Right pinky toe)     HPI   #Sore on right 5th digit - started 2 weeks ago - initially thought it was athletes foot and treated with OTC athletes foot - no improvement - switched to Vaseline w/o improvement - started neosporin w/o improvement - painful - he notes some redness    Review of Systems   Social History   Tobacco Use  Smoking Status Never Smoker  Smokeless Tobacco Never Used        Objective:    BP Readings from Last 3 Encounters:  09/15/20 90/60  09/15/20 (!) 144/84  06/23/20 135/85   Wt Readings from Last 3 Encounters:  09/15/20 184 lb (83.5 kg)  09/15/20 183 lb (83 kg)  06/23/20 175 lb (79.4 kg)    BP 90/60   Pulse 62   Temp 98.3 F (36.8 C) (Temporal)   Ht 5\' 11"  (1.803 m)   Wt 184 lb (83.5 kg)   SpO2 97%   BMI 25.66 kg/m    Physical Exam Constitutional:      Appearance: Normal appearance. He is not ill-appearing or diaphoretic.  HENT:     Right Ear: External ear normal.     Left Ear: External ear normal.  Eyes:     General: No scleral icterus.    Extraocular Movements: Extraocular movements intact.     Conjunctiva/sclera: Conjunctivae normal.  Cardiovascular:     Rate and Rhythm: Normal rate.     Pulses:          Dorsalis pedis pulses are 2+ on the right side and 2+ on the left side.       Posterior tibial pulses are 2+ on the right side and 2+ on the left side.  Pulmonary:     Effort: Pulmonary effort is normal.  Musculoskeletal:     Cervical back: Neck supple.  Skin:    General: Skin is warm and dry.     Comments: Right 5th digit:  Medial side with skin breakdown with two areas of central erythema and white discoloration. The lateral side of the two is erythematous.   Bilateral feet with purpling of the toes  Neurological:     Mental Status: He is alert. Mental status is at baseline.  Psychiatric:        Mood and Affect: Mood normal.            Assessment & Plan:   Problem List Items Addressed This Visit      Cardiovascular and Mediastinum   Diabetes mellitus with cardiac complication ([place]) (Chronic)    Lab Results  Component Value Date   HGBA1C 6.4 05/28/2020   Diabetes well controlled which is reassuring with current ulcer. Cont metformin and jardiance        Endocrine   Diabetic infection of right foot ([place]) - Primary    Erythema and skin breakdown concerning for early infection in pt with diabetes. Treat with augmentin. Return in 2 days for skin check with pcp. Had CBC w/ cardiology today will f/u results.       Relevant Medications   amoxicillin-clavulanate (AUGMENTIN) 875-125 MG tablet     Musculoskeletal and Integument   Ulcer of right foot, limited to breakdown of skin ([place])    Advised continued neosporin and using gauze to decrease pressure from adjacent toe.       Relevant Medications   amoxicillin-clavulanate (AUGMENTIN)  875-125 MG tablet       Return in about 2 days (around 09/17/2020).  Lesleigh Noe, MD  This visit occurred during the SARS-CoV-2 public health emergency.  Safety protocols were in place, including screening questions prior to the visit, additional usage of staff PPE, and extensive cleaning of exam room while observing appropriate contact time as indicated for disinfecting solutions.

## 2020-09-15 NOTE — Patient Instructions (Addendum)
Medication Instructions:  Your physician has recommended you make the following change in your medication:   STOP Carvedilol (Coreg)  *If you need a refill on your cardiac medications before your next appointment, please call your pharmacy*   Lab Work: Your provider recommends lab work today: CBC, BMP, TSH  If you have labs (blood work) drawn today and your tests are completely normal, you will receive your results only by: Marland Kitchen MyChart Message (if you have MyChart) OR . A paper copy in the mail If you have any lab test that is abnormal or we need to change your treatment, we will call you to review the results.   Testing/Procedures: Your physician has recommended that you wear a Zio monitor. This monitor is a medical device that records the heart's electrical activity. Doctors most often use these monitors to diagnose arrhythmias. Arrhythmias are problems with the speed or rhythm of the heartbeat. The monitor is a small device applied to your chest. You can wear one while you do your normal daily activities. While wearing this monitor if you have any symptoms to push the button and record what you felt. Once you have worn this monitor for the period of time provider prescribed (7 days), you will return the monitor device in the postage paid box. Once it is returned they will download the data collected and provide [place] with a report which the provider will then review and we will call you with those results. Important tips:  1. Avoid showering during the first 24 hours of wearing the monitor. 2. Avoid excessive sweating to help maximize wear time. 3. Do not submerge the device, no hot tubs, and no swimming pools. 4. Keep any lotions or oils away from the patch. 5. After 24 hours you may shower with the patch on. Take brief showers with your back facing the shower head.  6. Do not remove patch once it has been placed because that will interrupt data and decrease adhesive wear time. 7. Push the  button when you have any symptoms and write down what you were feeling. 8. Once you have completed wearing your monitor, remove and place into box which has postage paid and place in your outgoing mailbox.  9. If for some reason you have misplaced your box then call our office and we can provide another box and/or mail it off for you.        Follow-Up: At [facility], you and your health needs are our priority.  As part of our continuing mission to provide you with exceptional heart care, we have created designated Provider Care Teams.  These Care Teams include your primary Cardiologist (physician) and Advanced Practice Providers (APPs -  Physician Assistants and Nurse Practitioners) who all work together to provide you with the care you need, when you need it.  We recommend signing up for the patient portal called "MyChart".  Sign up information is provided on this After Visit Summary.  MyChart is used to connect with patients for Virtual Visits (Telemedicine).  Patients are able to view lab/test results, encounter notes, upcoming appointments, etc.  Non-urgent messages can be sent to your provider as well.   To learn more about what you can do with MyChart, go to [email or web address].    Your next appointment:   6 week(s)  The format for your next appointment:   In Person  Provider:   You may see Ida Rogue, MD or one of the following Advanced Practice Providers on your designated  Care Team:    Murray Hodgkins, NP  Christell Faith, PA-C  Marrianne Mood, PA-C  Cadence Kathlen Mody, [place]  Laurann Montana, NP  Other Instructions:   Tips to Measure your Blood Pressure Correctly  Here's what you can do to ensure a correct reading: . Don't drink a caffeinated beverage or smoke during the 30 minutes before the test. . Sit quietly for five minutes before the test begins. . During the measurement, sit in a chair with your feet on the floor and your arm supported so your elbow is at  about heart level. . The inflatable part of the cuff should completely cover at least 80% of your upper arm, and the cuff should be placed on bare skin, not over a shirt. . Don't talk during the measurement. . Have your blood pressure measured twice, with a brief break in between. If the readings are different by 5 points or more, have it done a third time.   Blood pressure categories  Blood pressure category SYSTOLIC (upper number)  DIASTOLIC (lower number)  Normal Less than 120 mm Hg and Less than 80 mm Hg  Elevated 120-129 mm Hg and Less than 80 mm Hg  High blood pressure: Stage 1 hypertension 130-139 mm Hg or 80-89 mm Hg  High blood pressure: Stage 2 hypertension 140 mm Hg or higher or 90 mm Hg or higher  Hypertensive crisis (consult your doctor immediately) Higher than 180 mm Hg and/or Higher than 120 mm Hg  Source: American Heart Association and American Stroke Association. For more on getting your blood pressure under control, buy Controlling Your Blood Pressure, a Special Health Report from [facility].   Blood Pressure Log   Date   Time  Blood Pressure  Position  Example: Nov 1 9 AM 124/78 sitting

## 2020-09-16 ENCOUNTER — Telehealth: Payer: Self-pay | Admitting: *Deleted

## 2020-09-16 ENCOUNTER — Other Ambulatory Visit
Admission: RE | Admit: 2020-09-16 | Discharge: 2020-09-16 | Disposition: A | Discharge status: Home / Self Care | Payer: Medicare PPO | Attending: Family | Admitting: Family

## 2020-09-16 DIAGNOSIS — E875 Hyperkalemia: Secondary | ICD-10-CM | POA: Insufficient documentation

## 2020-09-16 DIAGNOSIS — Z79899 Other long term (current) drug therapy: Secondary | ICD-10-CM | POA: Diagnosis not present

## 2020-09-16 DIAGNOSIS — I5020 Unspecified systolic (congestive) heart failure: Secondary | ICD-10-CM | POA: Insufficient documentation

## 2020-09-16 LAB — CBC
Hematocrit: 40.4 % (ref 37.5–51.0)
Hemoglobin: 13.8 g/dL (ref 13.0–17.7)
MCH: 31.2 pg (ref 26.6–33.0)
MCHC: 34.2 g/dL (ref 31.5–35.7)
MCV: 91 fL (ref 79–97)
Platelets: 226 10*3/uL (ref 150–450)
RBC: 4.43 x10E6/uL (ref 4.14–5.80)
RDW: 12.6 % (ref 11.6–15.4)
WBC: 6.7 10*3/uL (ref 3.4–10.8)

## 2020-09-16 LAB — TSH: TSH: 2.66 u[IU]/mL (ref 0.450–4.500)

## 2020-09-16 LAB — BASIC METABOLIC PANEL
Anion gap: 10 (ref 5–15)
BUN/Creatinine Ratio: 34 — ABNORMAL HIGH (ref 10–24)
BUN: 59 mg/dL — ABNORMAL HIGH (ref 8–27)
BUN: 61 mg/dL — ABNORMAL HIGH (ref 8–23)
CO2: 20 mmol/L (ref 20–29)
CO2: 22 mmol/L (ref 22–32)
Calcium: 9.1 mg/dL (ref 8.6–10.2)
Calcium: 9.6 mg/dL (ref 8.9–10.3)
Chloride: 99 mmol/L (ref 96–106)
Chloride: 104 mmol/L (ref 98–111)
Creatinine, Ser: 1.73 mg/dL — ABNORMAL HIGH (ref 0.76–1.27)
Creatinine, Ser: 1.74 mg/dL — ABNORMAL HIGH (ref 0.61–1.24)
GFR, Estimated: 38 mL/min — ABNORMAL LOW (ref >60)
Glucose, Bld: 95 mg/dL (ref 70–99)
Glucose: 96 mg/dL (ref 65–99)
Potassium: 5.9 mmol/L — ABNORMAL HIGH (ref 3.5–5.2)
Potassium: 5.1 mmol/L (ref 3.5–5.1)
Sodium: 139 mmol/L (ref 134–144)
Sodium: 136 mmol/L (ref 135–145)
eGFR: 38 mL/min/{1.73_m2} — ABNORMAL LOW (ref >59)

## 2020-09-16 NOTE — Telephone Encounter (Signed)
-----   Message from Loel Dubonnet, NP sent at 09/16/2020  1:01 PM EDT ----- Potassium improved but still high normal. Recommend stop Spironolactone and repeat BMP in 1 week. Avoid potassium supplements, salt substitute, high potassium drinks like Powerade.

## 2020-09-16 NOTE — Telephone Encounter (Signed)
Spoke to pt. Notified of lab results and provider's recc.  Pt does report that he drinks Powerade occasionally.  Advised pt no to drink Powerade as it has potassium.  Pt verbalized understanding.   [facility]  He will also hold Spironolactone until lab results return and will incr water intake d/t kidney function.  Orders placed. Pt with no further questions at this time.

## 2020-09-16 NOTE — Telephone Encounter (Signed)
Spoke to pt. Notified of lab results and provider's recc.  Pt and wife voiced understanding.  Pt will stop Spironolactone.  Avoid potassium supplements, salt substitute, high potassium drinks like Powerade. [facility].  Lab orders placed.

## 2020-09-16 NOTE — Telephone Encounter (Signed)
-----   Message from Loel Dubonnet, NP sent at 09/16/2020  9:07 AM EDT ----- CBC with no evidence of anemia nor severe infection. Kidney function shows mild decline compared to previous, recommend staying adequately hydrated. Potassium elevated, please ensure not taking any potassium supplement. This could be related to lab error, please have him present to [place] for repeat BMP today. He should hold Spironolactone until repeat lab work performed

## 2020-09-18 ENCOUNTER — Encounter: Payer: Self-pay | Admitting: Family Medicine

## 2020-09-18 ENCOUNTER — Other Ambulatory Visit: Payer: Self-pay

## 2020-09-18 ENCOUNTER — Ambulatory Visit: Payer: Medicare PPO | Admitting: Family Medicine

## 2020-09-18 VITALS — BP 122/60 | HR 60 | Temp 98.3°F | Ht 71.0 in | Wt 183.8 lb

## 2020-09-18 DIAGNOSIS — L089 Local infection of the skin and subcutaneous tissue, unspecified: Secondary | ICD-10-CM

## 2020-09-18 DIAGNOSIS — E11628 Type 2 diabetes mellitus with other skin complications: Secondary | ICD-10-CM

## 2020-09-18 NOTE — Patient Instructions (Addendum)
Can use Meloxicam as needed for toe pain.  Keep area clean and dry.  Complete Augmentin course.  Can use probiotic and yogurt for GI balance.

## 2020-09-18 NOTE — Progress Notes (Signed)
Patient ID: Dustin Harrison, male    DOB: Aug 19, 1931, 87 y.o.   MRN: [id number]  This visit was conducted in person.  BP 122/60   Pulse 60   Temp 98.3 F (36.8 C) (Temporal)   Ht 5\' 11"  (1.803 m)   Wt 183 lb 12 oz (83.3 kg)   SpO2 98%   BMI 25.63 kg/m    CC:  Chief Complaint  Patient presents with  . Follow-up    Diabetic Infection right foot/pinky toe    Subjective:   HPI: Dustin Harrison is a 87 y.o. male  With diabetes presenting on 09/18/2020 for Follow-up (Diabetic Infection right foot/pinky toe)  He saw Dr. Einar Pheasant for infection on right pinky toe 3 days ago.  Had been going on proceeding 2 weeks. Area was red and painful.  Treated with Augmentin.  CBC was unremarkable.  Today he reports  He is on day three of antibiotics.. redness decreasing.. still is painful.  No spreading of redness. No fever. No nausea , no emesis.    No SE.   Potassium was high at cardiology.. so held spironolactone and have scheduled recheck 09/22/2020.  Now wearing Zio monitor for eval.        Relevant past medical, surgical, family and social history reviewed and updated as indicated. Interim medical history since our last visit reviewed. Allergies and medications reviewed and updated. Outpatient Medications Prior to Visit  Medication Sig Dispense Refill  . amLODipine (NORVASC) 5 MG tablet Take 0.5 tablets (2.5 mg total) by mouth in the morning and at bedtime. 90 tablet 3  . amoxicillin-clavulanate (AUGMENTIN) 875-125 MG tablet Take 1 tablet by mouth 2 (two) times daily. 20 tablet 0  . aspirin 81 MG EC tablet Take 81 mg by mouth daily. Swallow whole.    Marland Kitchen atorvastatin (LIPITOR) 20 MG tablet TAKE ONE TABLET EVERY DAY 90 tablet 3  . empagliflozin (JARDIANCE) 10 MG TABS tablet Take 1 tablet (10 mg total) by mouth daily before breakfast. 30 tablet 11  . finasteride (PROSCAR) 5 MG tablet TAKE ONE TABLET EVERY DAY 90 tablet 1  . latanoprost (XALATAN) 0.005 % ophthalmic solution Place  1 drop into both eyes at bedtime.    Marland Kitchen losartan (COZAAR) 100 MG tablet Take 1 tablet (100 mg total) by mouth daily. 90 tablet 3  . meloxicam (MOBIC) 7.5 MG tablet TAKE ONE TABLET BY MOUTH EVERY MORNING AND AT BEDTIME 180 tablet 0  . metFORMIN (GLUCOPHAGE) 500 MG tablet TAKE ONE TABLET BY MOUTH TWICE DAILY WITH A MEAL 180 tablet 1  . mirtazapine (REMERON) 15 MG tablet TAKE ONE TABLET BY MOUTH AT BEDTIME 90 tablet 1  . Multiple Vitamins-Minerals (MULTIVITAMIN WITH MINERALS) tablet Take 1 tablet by mouth daily.    . pantoprazole (PROTONIX) 20 MG tablet TAKE 1 TABLET BY MOUTH DAILY 90 tablet 3  . polyethylene glycol powder (GLYCOLAX/MIRALAX) 17 GM/SCOOP powder Take 1 Container by mouth daily as needed for mild constipation or moderate constipation.     . tamsulosin (FLOMAX) 0.4 MG CAPS capsule TAKE 2 CAPSULES EVERY DAY 180 capsule 3  . timolol (TIMOPTIC) 0.5 % ophthalmic solution Place 1 drop into both eyes 2 (two) times daily.      No facility-administered medications prior to visit.     Per HPI unless specifically indicated in ROS section below Review of Systems  Constitutional: Negative for fatigue and fever.  HENT: Negative for ear pain.   Eyes: Negative for pain.  Respiratory: Negative for  cough and shortness of breath.   Cardiovascular: Negative for chest pain, palpitations and leg swelling.  Gastrointestinal: Negative for abdominal pain.  Genitourinary: Negative for dysuria.  Musculoskeletal: Negative for arthralgias.  Neurological: Negative for syncope, light-headedness and headaches.  Psychiatric/Behavioral: Negative for dysphoric mood.   Objective:  BP 122/60   Pulse 60   Temp 98.3 F (36.8 C) (Temporal)   Ht 5\' 11"  (1.803 m)   Wt 183 lb 12 oz (83.3 kg)   SpO2 98%   BMI 25.63 kg/m   Wt Readings from Last 3 Encounters:  09/18/20 183 lb 12 oz (83.3 kg)  09/15/20 184 lb (83.5 kg)  09/15/20 183 lb (83 kg)      Physical Exam Constitutional:      Appearance: He is  well-developed.  HENT:     Head: Normocephalic.     Right Ear: Hearing normal.     Left Ear: Hearing normal.     Nose: Nose normal.  Neck:     Thyroid: No thyroid mass or thyromegaly.     Vascular: No carotid bruit.     Trachea: Trachea normal.  Cardiovascular:     Rate and Rhythm: Normal rate and regular rhythm.     Pulses: Normal pulses.     Heart sounds: Heart sounds not distant. No murmur heard. No friction rub. No gallop.      Comments: No peripheral edema Pulmonary:     Effort: Pulmonary effort is normal. No respiratory distress.     Breath sounds: Normal breath sounds.  Skin:    General: Skin is warm and dry.     Findings: No rash.     Comments: Interval decrease in redness and swelling in right 5th digit  Psychiatric:        Speech: Speech normal.        Behavior: Behavior normal.        Thought Content: Thought content normal.       Results for orders placed or performed during the hospital encounter of 60/73/71  Basic metabolic panel  Result Value Ref Range   Sodium 136 135 - 145 mmol/L   Potassium 5.1 3.5 - 5.1 mmol/L   Chloride 104 98 - 111 mmol/L   CO2 22 22 - 32 mmol/L   Glucose, Bld 95 70 - 99 mg/dL   BUN 61 (H) 8 - 23 mg/dL   Creatinine, Ser 1.74 (H) 0.61 - 1.24 mg/dL   Calcium 9.6 8.9 - 10.3 mg/dL   GFR, Estimated 38 (L) >60 mL/min   Anion gap 10 5 - 15    This visit occurred during the SARS-CoV-2 public health emergency.  Safety protocols were in place, including screening questions prior to the visit, additional usage of staff PPE, and extensive cleaning of exam room while observing appropriate contact time as indicated for disinfecting solutions.   COVID 19 screen:  No recent travel or known exposure to COVID19 The patient denies respiratory symptoms of COVID 19 at this time. The importance of social distancing was discussed today.   Assessment and Plan     Eliezer Lofts, MD

## 2020-09-23 ENCOUNTER — Other Ambulatory Visit
Admission: RE | Admit: 2020-09-23 | Discharge: 2020-09-23 | Disposition: A | Discharge status: Home / Self Care | Payer: Medicare PPO | Source: Ambulatory Visit | Attending: Family | Admitting: Family

## 2020-09-23 ENCOUNTER — Telehealth: Payer: Self-pay | Admitting: *Deleted

## 2020-09-23 DIAGNOSIS — E875 Hyperkalemia: Secondary | ICD-10-CM | POA: Insufficient documentation

## 2020-09-23 DIAGNOSIS — I5020 Unspecified systolic (congestive) heart failure: Secondary | ICD-10-CM | POA: Diagnosis not present

## 2020-09-23 DIAGNOSIS — Z79899 Other long term (current) drug therapy: Secondary | ICD-10-CM | POA: Insufficient documentation

## 2020-09-23 LAB — BASIC METABOLIC PANEL
Anion gap: 9 (ref 5–15)
BUN: 49 mg/dL — ABNORMAL HIGH (ref 8–23)
CO2: 25 mmol/L (ref 22–32)
Calcium: 9.6 mg/dL (ref 8.9–10.3)
Chloride: 103 mmol/L (ref 98–111)
Creatinine, Ser: 1.52 mg/dL — ABNORMAL HIGH (ref 0.61–1.24)
GFR, Estimated: 44 mL/min — ABNORMAL LOW (ref >60)
Glucose, Bld: 143 mg/dL — ABNORMAL HIGH (ref 70–99)
Potassium: 5.7 mmol/L — ABNORMAL HIGH (ref 3.5–5.1)
Sodium: 137 mmol/L (ref 135–145)

## 2020-09-23 MED ORDER — LOSARTAN POTASSIUM 100 MG PO TABS: 50.0000 mg | ORAL_TABLET | Freq: Every day | ORAL | 3 refills | Status: DC

## 2020-09-23 NOTE — Telephone Encounter (Signed)
Called patient and was able to get in touch with him.  He wrote down and verbalized back the instructions as listed by provider.  He will come by in the morning to pick up the [facility] samples.  Med list updated.

## 2020-09-23 NOTE — Telephone Encounter (Signed)
-----   Message from Loel Dubonnet, NP sent at 09/23/2020  2:51 PM EDT ----- Potassium high again. Please ensure not taking Spironolactone nor drinking electrolyte supplements or using salt substitute. Recommend Lokelma 10mg  x1 dose. Samples available. Recommend hold Losartan Wednesday and resume Thursday at 50mg  daily. Recommend repeat BMP Thursday 09/25/20 to reassess potassium level.

## 2020-09-23 NOTE — Telephone Encounter (Signed)
Released to [place] by provider. No answer. Left message to call back on home and mobile number. Will try again later.

## 2020-09-25 ENCOUNTER — Telehealth: Payer: Self-pay | Admitting: *Deleted

## 2020-09-25 ENCOUNTER — Other Ambulatory Visit
Admission: RE | Admit: 2020-09-25 | Discharge: 2020-09-25 | Disposition: A | Discharge status: Home / Self Care | Payer: Medicare PPO | Attending: Family | Admitting: Family

## 2020-09-25 ENCOUNTER — Telehealth: Payer: Self-pay | Admitting: Cardiovascular Disease

## 2020-09-25 ENCOUNTER — Other Ambulatory Visit: Payer: Self-pay

## 2020-09-25 DIAGNOSIS — E875 Hyperkalemia: Secondary | ICD-10-CM | POA: Diagnosis not present

## 2020-09-25 LAB — BASIC METABOLIC PANEL
Anion gap: 10 (ref 5–15)
BUN: 45 mg/dL — ABNORMAL HIGH (ref 8–23)
CO2: 24 mmol/L (ref 22–32)
Calcium: 9.2 mg/dL (ref 8.9–10.3)
Chloride: 102 mmol/L (ref 98–111)
Creatinine, Ser: 1.76 mg/dL — ABNORMAL HIGH (ref 0.61–1.24)
GFR, Estimated: 37 mL/min — ABNORMAL LOW (ref >60)
Glucose, Bld: 92 mg/dL (ref 70–99)
Potassium: 5.3 mmol/L — ABNORMAL HIGH (ref 3.5–5.1)
Sodium: 136 mmol/L (ref 135–145)

## 2020-09-25 NOTE — Telephone Encounter (Signed)
Spoke with the patient. Patient sts that his BP this morning has been elevated. Readings provided below 179/102 and 209/97 Patient complains of a throbbing headache. He denies any signs of stroke.  Patient sts that he did take Amlodipine and Losartan this morning.  Patient has had several recent changes in his hypertensive medication due to hypotension and hyperkalemia.  Carvedilol 6.25 mg bid- HELD Spironolactone 12.5 qd- HELD  Amlodipine 2.5 mg bid- TAKING Losartan 50mg  qd- (Held and the reduced restarting today)  He [facility] his [facility].  Adv the patient that Laurann Montana, NP is out of the office today. Adv that I will talk with another provider and call back with their recommendation.  Discussed with Ignacia Bayley, NP. Per Chris's instruction the patient should go ahead and take an additional 7.5 mg of Amlodipine now. Starting tomorrow he should increase Amlodipine 5mg  bid. He should continue with planned repeat bmet today. Further meds changes can be made after results are received.  Called the patient. lmtcb.

## 2020-09-25 NOTE — Telephone Encounter (Signed)
-----   Message from Loel Dubonnet, NP sent at 09/25/2020  2:26 PM EDT ----- Kidney function overall stable. Continue to stay well hydrated. Potassium improved compared to previous though still mildly elevated. He picked up [facility] packets recently. Recommend another dose today (Friday) and Sunday with repeat BMP Monday.

## 2020-09-25 NOTE — Telephone Encounter (Signed)
Pt c/o BP issue: STAT if pt c/o blurred vision, one-sided weakness or slurred speech  1. What are your last 5 BP readings?  179/102 209/97   2. Are you having any other symptoms (ex. Dizziness, headache, blurred vision, passed out)? Throbbing in head - headache  3. What is your BP issue? BP is high

## 2020-09-25 NOTE — Telephone Encounter (Signed)
Attempted to call pt, no answer, lmtcb.   Per Dustin Harrison, if able to reach pt today, he will take another dose today (Thursday) and Sunday.  If reach pt tomorrow, he may take another dose Friday and Sunday and repeat BMP on Monday.

## 2020-09-25 NOTE — Telephone Encounter (Addendum)
Called the patient. lmtcb.  Patient bmp from today has resulted a K+ of 5.3. Reviewed with Ignacia Bayley, NP. Patient should have a repeat bmp in 1 week.  Patient is to increase Amlodipine. He is to take 7.5 mg of Amlodipine now and then starting tomorrow increase Amlodipine to 5 mg bid. Continue Losartan 50 mg daily.

## 2020-09-26 MED ORDER — AMLODIPINE BESYLATE 5 MG PO TABS: 5.0000 mg | ORAL_TABLET | Freq: Two times a day (BID) | ORAL | 3 refills | Status: DC

## 2020-09-26 NOTE — Telephone Encounter (Signed)
Duplicate encounter. See 09/26/20 telephone encounter for further documentation.

## 2020-09-26 NOTE — Telephone Encounter (Signed)
Spoke to pt, notified of lab results and provider's recc below.  Pt verbalized understanding.  He will take another dose of Lokelma today (Friday 4/22) and Sunday 4/24. Will repeat BMET at [facility] on Monday 4/25. Lab orders placed.   Pt also reports that his BP was still elevated yesterday. See phone note 4/21.  Reviewed recc in yesterday's note:   "Patient is to increase Amlodipine. He is to take 7.5 mg of Amlodipine now and then starting tomorrow increase Amlodipine to 5 mg bid. Continue Losartan 50 mg daily."  Pt verbalized he thought he was still to take Amlodipine 2.5mg  BID and did not take 7.5mg  one time dose as instructed yesterday.  Pt also asks if he is to continue Spironolactone. Per phone/result note 4/12 pt was instructed to STOP Spironolactone due to high potassium. Pt voices he continued taking. Reviewed med list in detail with pt. Clarified with pt that to remove Spironolactone from medications at home and do not take. Reminded to also to continue Losartan 50mg  daily and take new incr dose of Amlodipine 5mg  BID. Asked pt to continue to monitor BP and let us know if continued elevated.   Discussed pt's difficulty understanding medication instructions with recent changes and continued elevated BP with Caitlin.  Per CW scheduled pt to be seen sooner in office next Wednesday 4/27 with Vella Raring, NP. Advised pt to bring his medication bottles and BP cuff with him to visit. Pt confirmed appt 4/27 @ 8:30 AM. Pt will still have BMET repeated on Monday 4/25. Pt has no further questions at this time.

## 2020-09-27 DIAGNOSIS — I25118 Atherosclerotic heart disease of native coronary artery with other forms of angina pectoris: Secondary | ICD-10-CM | POA: Diagnosis not present

## 2020-09-27 DIAGNOSIS — R42 Dizziness and giddiness: Secondary | ICD-10-CM | POA: Diagnosis not present

## 2020-09-27 DIAGNOSIS — I5020 Unspecified systolic (congestive) heart failure: Secondary | ICD-10-CM | POA: Diagnosis not present

## 2020-09-29 ENCOUNTER — Other Ambulatory Visit
Admission: RE | Admit: 2020-09-29 | Discharge: 2020-09-29 | Disposition: A | Discharge status: Home / Self Care | Payer: Medicare PPO | Attending: Family | Admitting: Family

## 2020-09-29 DIAGNOSIS — I471 Supraventricular tachycardia, unspecified: Secondary | ICD-10-CM

## 2020-09-29 DIAGNOSIS — E875 Hyperkalemia: Secondary | ICD-10-CM | POA: Insufficient documentation

## 2020-09-29 HISTORY — DX: Supraventricular tachycardia, unspecified: I47.10

## 2020-09-29 LAB — BASIC METABOLIC PANEL
Anion gap: 9 (ref 5–15)
BUN: 46 mg/dL — ABNORMAL HIGH (ref 8–23)
CO2: 26 mmol/L (ref 22–32)
Calcium: 9.3 mg/dL (ref 8.9–10.3)
Chloride: 104 mmol/L (ref 98–111)
Creatinine, Ser: 1.57 mg/dL — ABNORMAL HIGH (ref 0.61–1.24)
GFR, Estimated: 43 mL/min — ABNORMAL LOW (ref >60)
Glucose, Bld: 101 mg/dL — ABNORMAL HIGH (ref 70–99)
Potassium: 4.5 mmol/L (ref 3.5–5.1)
Sodium: 139 mmol/L (ref 135–145)

## 2020-09-30 ENCOUNTER — Telehealth: Payer: Self-pay | Admitting: *Deleted

## 2020-09-30 NOTE — Progress Notes (Signed)
Office Visit    Patient Name: Dustin Harrison Date of Encounter: 10/01/2020  Primary Care Provider:  Jinny Sanders, MD Primary Cardiologist:  Ida Rogue, MD Electrophysiologist:  None   Chief Complaint    Dustin Harrison is a 87 y.o. male with a hx of HFrEF, bradycardia, h/o pheochromocytoma, R adrenal tumor s/p R adrenalectomy, cochlear implant on left 06/2018, Dm2, chronic back pain, recent bacteremia 10/2019 s/p TEE and IV abx, CKD, CAD, aortic stenosis, syncope presents today for hyperkalemia.  Past Medical History    Past Medical History:  Diagnosis Date  . Abnormal glucose   . Arthritis   . Baker's cyst of knee    left  . BPH (benign prostatic hypertrophy)   . Cough    because of "tight" esophagus  . Diabetes mellitus   . Glaucoma   . HOH (hard of hearing)   . Hypertension   . Pheochromocytoma of right adrenal gland   . Stroke ([place])   . Ulcer   . Wears hearing aid    bilateral   Past Surgical History:  Procedure Laterality Date  . adrenaletomy    . CARDIAC CATHETERIZATION    . CATARACT EXTRACTION W/PHACO Left 10/08/2015   Procedure: CATARACT EXTRACTION PHACO AND INTRAOCULAR LENS PLACEMENT ([place]) left eye;  Surgeon: Leandrew Koyanagi, MD;  Location: [place];  Service: Ophthalmology;  Laterality: Left;  MALYUGIN  . HERNIA REPAIR    . RIGHT/LEFT HEART CATH AND CORONARY ANGIOGRAPHY Left 11/28/2019   Procedure: RIGHT/LEFT HEART CATH AND CORONARY ANGIOGRAPHY;  Surgeon: Nelva Bush, MD;  Location: [facility];  Service: Cardiovascular;  Laterality: Left;  . SQUAMOUS CELL CARCINOMA EXCISION  03-2006   left ear  . TEE WITHOUT CARDIOVERSION N/A 10/12/2019   Procedure: TRANSESOPHAGEAL ECHOCARDIOGRAM (TEE);  Surgeon: Minna Merritts, MD;  Location: [facility];  Service: Cardiovascular;  Laterality: N/A;  . TONSILLECTOMY    . XI ROBOTIC LAPAROSCOPIC ASSISTED APPENDECTOMY N/A 10/11/2019   Procedure: XI ROBOTIC ASSISTED LAPAROSCOPIC INSERTION  GASTROSTOMY TUBE;  Surgeon: Jules Husbands, MD;  Location: [facility];  Service: General;  Laterality: N/A;    Allergies  No Known Allergies  History of Present Illness    Dustin Harrison is a 87 y.o. male with a hx of HFrEF, bradycardia, h/o pheochromocytoma, R adrenal tumor s/p R adrenalectomy, cochlear implant on left 06/2018, DM2, chronic back pain, recent bacteremia 10/2019 s/p TEE and IV abx, CKD, CAD, aortic stenosis, syncope. He was last seen 09/15/20.  Evaluated while admitted 10/09/19 in setting of positive cultures for GBS bacteremia. Source of infection not identified. TEE with LVEF 25-35%, gr1DD, significant wall motion abnormalities with images suggestive of stress induced CM versus LAD infarct. TEE with no evidence of vegetation/endocarditis and moderate AS not visualized on TTE. He was started on feeding tube due to R vocal cord paralysis. Noted 2.2cm intraluminal density which would require further GI workup.   Seen in clinic 11/26/19 noting recent syncopal episode with LOC for 10 seconds. Cardiac cath 11/28/19 with severe single-vessel coronary disease with 90% ostial stenosis involving small ramus intermedius that fills via right-to-left collaterals, mild to moderate nonobstructive CAD involving LAD, LCx, RCA, upper normal to mildly elevated L/R heart filling pressures, mild to moderate AS.   Seen in clinic 12/11/19 after he had transitioned back to independent living at [facility]. 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 His feeding tube had been removed and he was  working with speech therapy. He was started on Spironolactone 12.5mg  daily.   He was seen in clinic 01/09/2020.  He was doing well with NYHA I symptoms.  No changes were made at that time.  He was recommended for repeat echocardiogram.  Underwent echocardiogram 01/14/2020 with EF  normalized with LVEF 60 to 65%, no RWMA, mild LVH, RV normal size and function, trivial MR, mild dilation of ascending  aorta 39 mm.  At follow-up.  At follow-up September 2021 in January of this year he was noted to be doing well and no changes were made.  Seen in follow-up 09/15/20.  He continued to work on the pool twice per week and was feeling overall well from a cardiac perspective.  He did note a few falls related to lightheadedness with no significant injury.  His carvedilol was stopped and a 7-day ZIO monitor was placed.  Lab work showed hyperkalemia with K5.9.  Repeat potassium collected the subsequent day of 5.1, spironolactone was discontinued..  Repeat potassium 09/23/2020 at 5.7, he was recommended for dose of Lokelma.  Repeat potassium 09/24/2020 at 5.3, he was recommended for 2 doses of Lokelma.  It was noted that he had resumed his spironolactone and was encouraged to discontinue.  Most recent potassium 09/29/2020 of 4.5.  Monitor 09/29/20 not yet reviewed by MD but preliminary report shows predominantly normal sinus rhythm 58 bpm with first-degree AV block, 16 runs of SVT which were asymptomatic, ectopic atrial rhythm as well as junctional rhythm and occasional PAC 1.3%, rare PVC.  He presents today for follow-up.significant portion of the visit was spent taking medications from his pill organizer and using Epocrates pill identifier to figure out which medications he was actually taking. Tells me his daughter visits from [place] once per month and makes his pill packs for the month.  He has not been taking spironolactone since directed and has not been taking carvedilol as directed.  He has been taking losartan 100 mg daily as well as torsemide 20 mg daily which are different than initial dosing but blood pressure is reasonably well controlled today.  We reviewed his preliminary monitor report.  Reports only rare episodes of lightheadedness and was reassured by the result.  Denies chest pain, pressure, tightness.  Reports no shortness of breath at rest nor dyspnea on exertion.  EKGs/Labs/Other Studies  Reviewed:   The following studies were reviewed today:  [facility] 11/28/19 Conclusions: 1. Severe single-vessel coronary artery disease with 90% ostial stenosis involving small ramus intermedius branch that also fills via right-to-left collaterals. 2. Mild to moderate, non-obstructive coronary artery disease involving the LAD, LCx, and RCA. 3. Upper normal to mildly elevated left and right heart filling pressures. 4. Hyperdynamic left ventricular contraction. 5. Mild to moderate aortic stenosis.   Recommendations: 1. Continue medical therapy and secondary prevention of coronary artery disease. 2. Consider repeat echo to confirm LVEF and aortic stenosis.  TTE 10/08/19  1. Left ventricular ejection fraction, by estimation, is 25 to 30%. The  left ventricle has severely decreased function. The left ventricle has no  regional wall motion abnormalities. There is moderate left ventricular  hypertrophy. Left ventricular  diastolic parameters are consistent with Grade I diastolic dysfunction  (impaired relaxation). There is severe akinesis of the left ventricular,  mid-apical anteroseptal wall, anterolateral wall, anterior segment, apical  segment and inferoseptal wall. Wall   motion abnormalities are suggestive of stress induced cardiomyopathy vs.  LAD infarct.   2. Right ventricular systolic function is normal. The right ventricular  size is normal. There is mildly elevated pulmonary artery systolic  pressure.   3. Left atrial size was mildly dilated.   4. The mitral valve is normal in structure. No evidence of mitral valve  regurgitation. No evidence of mitral stenosis.   5. The aortic valve is normal in structure. Aortic valve regurgitation is  not visualized. Mild to moderate aortic valve sclerosis/calcification is  present, without any evidence of aortic stenosis.   6. The inferior vena cava is normal in size with greater than 50%  respiratory variability, suggesting right atrial pressure  of 3 mmHg.   7. No clear vegetations but suboptimal study.    TEE 10/12/19  1. Left ventricular ejection fraction, by estimation, is 25 to 30%. The  left ventricle has severely decreased function. The left ventricle  demonstrates global hypokinesis.   2. Right ventricular systolic function is normal. The right ventricular  size is normal. Tricuspid regurgitation signal is inadequate for assessing  PA pressure.   3. The mitral valve is normal in structure. No evidence of mitral valve  regurgitation.   4. Aortic valve regurgitation is not visualized. Severe calcification of  aortic valve. Visually, there appears to be moderate aortic valve  stenosis.   5. Grossly, no valve vegetation noted.    EKG:  EKG is  ordered today.  The ekg ordered today demonstrates junctional rhythm and sinus bradycardia 55bpm with PACs. No acute ST/T wave changes.  Recent Labs: 10/14/2019: B Natriuretic Peptide 1,273.0 10/15/2019: Magnesium 2.4 05/28/2020: ALT 32 09/15/2020: Hemoglobin 13.8; Platelets 226; TSH 2.660 09/29/2020: BUN 46; Creatinine, Ser 1.57; Potassium 4.5; Sodium 139  Recent Lipid Panel    Component Value Date/Time   CHOL 170 05/28/2020 0753   TRIG 49.0 05/28/2020 0753   HDL 89.30 05/28/2020 0753   CHOLHDL 2 05/28/2020 0753   VLDL 9.8 05/28/2020 0753   LDLCALC 71 05/28/2020 0753   LDLDIRECT 126.2 04/05/2013 0739    Home Medications   Current Meds  Medication Sig  . amLODipine (NORVASC) 5 MG tablet Take 1 tablet (5 mg total) by mouth in the morning and at bedtime.  Marland Kitchen aspirin 81 MG EC tablet Take 81 mg by mouth daily. Swallow whole.  Marland Kitchen atorvastatin (LIPITOR) 20 MG tablet TAKE ONE TABLET EVERY DAY  . buPROPion (WELLBUTRIN XL) 300 MG 24 hr tablet Take 300 mg by mouth daily.  . finasteride (PROSCAR) 5 MG tablet TAKE ONE TABLET EVERY DAY  . losartan (COZAAR) 100 MG tablet Take 1 tablet (100 mg total) by mouth daily.  . meloxicam (MOBIC) 7.5 MG tablet TAKE ONE TABLET BY MOUTH EVERY MORNING AND  AT BEDTIME  . metFORMIN (GLUCOPHAGE) 500 MG tablet TAKE ONE TABLET BY MOUTH TWICE DAILY WITH A MEAL  . mirtazapine (REMERON) 15 MG tablet TAKE ONE TABLET BY MOUTH AT BEDTIME  . Multiple Vitamins-Minerals (MULTIVITAMIN WITH MINERALS) tablet Take 1 tablet by mouth daily.  . pantoprazole (PROTONIX) 20 MG tablet TAKE 1 TABLET BY MOUTH DAILY  . tamsulosin (FLOMAX) 0.4 MG CAPS capsule TAKE 2 CAPSULES EVERY DAY  . torsemide (DEMADEX) 20 MG tablet Take 20 mg by mouth daily.  . [DISCONTINUED] losartan (COZAAR) 100 MG tablet Take 0.5 tablets (50 mg total) by mouth daily. (Patient taking differently: Take 100 mg by mouth daily.)    Review of Systems  All other systems reviewed and are otherwise negative except as noted above.  Physical Exam   VS:  BP 140/70 (BP Location: Left Arm, Patient Position: Sitting, Cuff Size: Normal)  Pulse (!) 55   Ht 5\' 11"  (1.803 m)   Wt 185 lb (83.9 kg)   SpO2 98%   BMI 25.80 kg/m  , BMI Body mass index is 25.8 kg/m. GEN: Well nourished, well developed, in no acute distress. HEENT: normal. Neck: Supple, no JVD, carotid bruits, or masses. Cardiac: RRR, bradycardic, gr 2/6 systolic murmur, no  rubs, or gallops. No clubbing, cyanosis, edema.  Radials/DP/PT 2+ and equal bilaterally.  Respiratory:  Respirations regular and unlabored, clear to auscultation bilaterally. GI: Soft, nontender, nondistended, MS: No deformity or atrophy. Skin: Warm and dry, no rash. Neuro:  Strength and sensation are intact. Psych: Normal affect.  Assessment & Plan    1. Lightheadedness/dizziness/orthostatic hypotension/syncope/bradycardia -previous orthostatic hypotension was improved with preventative measures such as remaining well-hydrated and slow position changes.  Carvedilol discontinued.  7-day ZIO monitor preliminary report with predominantly sinus rhythm, first-degree AV block, ectopic atrial rhythm, junctional rhythm.  He also had rare PVC and occasional PAC.  Tells me  lightheadedness has improved. If recurs, would consider echocardiogram.  2. Hyperkalemia - Spironolactone has been discontinued. Has required multiple doses of Lokelma. Most recent K 4.5. BMP in 1 week to ensure stable potassium.   3. CAD -No anginal symptoms.  Right and left heart cath 11/28/2019 with severe single-vessel CAD with 90% ostial stenosis involving small ramus intermedius branch that fills with right to left collaterals otherwise mild to moderate nonobstructive CAD in LAD, LCx, RCA. Recommended for medical management. Continue aspirin, statin.  No beta-blocker due to junctional rhythm, lightheadedness, bradycardia.  4. Aortic stenosis -Moderate by TEE 10/2019.  Mild to moderate by echo 11/28/2019.  Repeat echo 01/12/2020 with no evidence of aortic stenosis.  Educated on signs and symptoms of worsening aortic stenosis.  Continue optimal BP control.  Reports no chest pain, no recurrent lightheadedness, no shortness of breath.  No indication for repeat echocardiogram at this time.  5. HFrEF - Euvolemic and well compensated on exam.  EF has normalized to 60 to 65%.  GDMT includes Coreg, Losartan, Spironolactone. No indication for loop diuretic at this time.  Continue low-sodium, heartily diet.  Continue regular cardiovascular exercise.  NYHA I.  6. HTN -BP mildly elevated in clinic but reports it is well controlled at home.  Continue to monitor at home and report blood pressure consistently greater than 130/80.  7. HLD, LDL goal <70 -.  Continue atorvastatin 20 mg daily.   8. DM2 -continue to follow with PCP.  Disposition: Follow up in 3-4 month(s) with Dr. Rockey Situ or APP.   Loel Dubonnet, NP 10/01/2020, 7:35 PM

## 2020-09-30 NOTE — Telephone Encounter (Signed)
-----   Message from Loel Dubonnet, NP sent at 09/30/2020  7:31 AM EDT ----- Stable kidney function. Normal potassium. Remain OFF Spironolactone. No indication for Lokelma at this time. Follow up as scheduled 10/01/20. Please bring pill bottles to appointment.

## 2020-09-30 NOTE — Telephone Encounter (Signed)
Spoke to pt and notified of results and provider's recc.  Pt voiced understanding.  States he will continue to stay OFF Spironolactone and will not take any more Lokelma.  Pt will bring BP cuff and medication bottles tomorrow at follow up appt 4/27.  Pt has no questions at this time.

## 2020-10-01 ENCOUNTER — Other Ambulatory Visit: Payer: Self-pay

## 2020-10-01 ENCOUNTER — Encounter: Payer: Self-pay | Admitting: Family

## 2020-10-01 ENCOUNTER — Ambulatory Visit: Payer: Medicare PPO | Admitting: Family

## 2020-10-01 VITALS — BP 140/70 | HR 55 | Ht 71.0 in | Wt 185.0 lb

## 2020-10-01 DIAGNOSIS — R42 Dizziness and giddiness: Secondary | ICD-10-CM | POA: Diagnosis not present

## 2020-10-01 DIAGNOSIS — I5020 Unspecified systolic (congestive) heart failure: Secondary | ICD-10-CM

## 2020-10-01 DIAGNOSIS — I10 Essential (primary) hypertension: Secondary | ICD-10-CM

## 2020-10-01 DIAGNOSIS — R001 Bradycardia, unspecified: Secondary | ICD-10-CM

## 2020-10-01 DIAGNOSIS — I25118 Atherosclerotic heart disease of native coronary artery with other forms of angina pectoris: Secondary | ICD-10-CM

## 2020-10-01 DIAGNOSIS — Z79899 Other long term (current) drug therapy: Secondary | ICD-10-CM | POA: Diagnosis not present

## 2020-10-01 DIAGNOSIS — E785 Hyperlipidemia, unspecified: Secondary | ICD-10-CM | POA: Diagnosis not present

## 2020-10-01 MED ORDER — LOSARTAN POTASSIUM 100 MG PO TABS: 100.0000 mg | ORAL_TABLET | Freq: Every day | ORAL | 1 refills | Status: DC

## 2020-10-01 NOTE — Patient Instructions (Signed)
Medication Instructions:  Continue your current medications.   We have updated your medication list to show what you are currently taking.   Remain OFF Spironolactone and Carvedilol.  Please discuss whether you should be taking Jardiance (Empagliflozin) with Dr. Diona Browner as we did not see in in your medications today.   *If you need a refill on your cardiac medications before your next appointment, please call your pharmacy*   Lab Work: Your physician recommends that you return for lab work in 1 week at [facility] for [facility] to recheck your potassium.   Medical Mall Entrance at [facility] 1st desk on the right to check in, past the screening table Lab hours: Monday- Friday (7:30 am- 5:30 pm)  If you have labs (blood work) drawn today and your tests are completely normal, you will receive your results only by: Marland Kitchen MyChart Message (if you have MyChart) OR . A paper copy in the mail If you have any lab test that is abnormal or we need to change your treatment, we will call you to review the results.   Testing/Procedures: Your monitor showed normal sinus rhythm with an occasional early beat. You had a few episodes of a fast heart beat which were asymptomatic and not of concern. This is a good result!   Follow-Up: At [facility], you and your health needs are our priority.  As part of our continuing mission to provide you with exceptional heart care, we have created designated Provider Care Teams.  These Care Teams include your primary Cardiologist (physician) and Advanced Practice Providers (APPs -  Physician Assistants and Nurse Practitioners) who all work together to provide you with the care you need, when you need it.  We recommend signing up for the patient portal called "MyChart".  Sign up information is provided on this After Visit Summary.  MyChart is used to connect with patients for Virtual Visits (Telemedicine).  Patients are able to view lab/test results, encounter notes,  upcoming appointments, etc.  Non-urgent messages can be sent to your provider as well.   To learn more about what you can do with MyChart, go to [email or web address].    Your next appointment:   3-4 month(s)  The format for your next appointment:   In Person  Provider:   You may see Ida Rogue, MD or one of the following Advanced Practice Providers on your designated Care Team:    Murray Hodgkins, NP  Christell Faith, PA-C  Marrianne Mood, PA-C  Cadence Kathlen Mody, [place]  Laurann Montana, NP    Other Instructions  Tips to Measure your Blood Pressure Correctly  Here's what you can do to ensure a correct reading: . Don't drink a caffeinated beverage or smoke during the 30 minutes before the test. . Sit quietly for five minutes before the test begins. . During the measurement, sit in a chair with your feet on the floor and your arm supported so your elbow is at about heart level. . The inflatable part of the cuff should completely cover at least 80% of your upper arm, and the cuff should be placed on bare skin, not over a shirt. . Don't talk during the measurement. . Have your blood pressure measured twice, with a brief break in between. If the readings are different by 5 points or more, have it done a third time.  Blood pressure categories  Blood pressure category SYSTOLIC (upper number)  DIASTOLIC (lower number)  Normal Less than 120 mm Hg and Less than 80 mm  Hg  Elevated 120-129 mm Hg and Less than 80 mm Hg  High blood pressure: Stage 1 hypertension 130-139 mm Hg or 80-89 mm Hg  High blood pressure: Stage 2 hypertension 140 mm Hg or higher or 90 mm Hg or higher  Hypertensive crisis (consult your doctor immediately) Higher than 180 mm Hg and/or Higher than 120 mm Hg  Source: American Heart Association and American Stroke Association. For more on getting your blood pressure under control, buy Controlling Your Blood Pressure, a Special Health Report from [facility].   Blood Pressure Log   Date   Time  Blood Pressure  Position  Example: Nov 1 9 AM 124/78 sitting                                                   Heart Healthy Diet Recommendations: A low-salt diet is recommended. Meats should be grilled, baked, or boiled. Avoid fried foods. Focus on lean protein sources like fish or chicken with vegetables and fruits. The American Heart Association is a Microbiologist!  American Heart Association Diet and Lifeystyle Recommendations   Exercise recommendations: The American Heart Association recommends 150 minutes of moderate intensity exercise weekly. Try 30 minutes of moderate intensity exercise 4-5 times per week. This could include walking, jogging, or swimming.

## 2020-10-06 ENCOUNTER — Other Ambulatory Visit: Payer: Self-pay | Admitting: Cardiovascular Disease

## 2020-10-07 ENCOUNTER — Telehealth: Payer: Self-pay | Admitting: Cardiovascular Disease

## 2020-10-07 MED ORDER — AMLODIPINE BESYLATE 5 MG PO TABS: 5.0000 mg | ORAL_TABLET | Freq: Two times a day (BID) | ORAL | 3 refills | Status: DC

## 2020-10-07 NOTE — Telephone Encounter (Signed)
Rx request sent to pharmacy.  

## 2020-10-07 NOTE — Telephone Encounter (Signed)
Pharmacist is calling to get clarification on doseage for Amlodipine 5 mg.

## 2020-10-07 NOTE — Telephone Encounter (Signed)
Attempted to reach ou to pt's daughter, Zigmund Daniel (DPR approved) for clarification of pt's amlodipine so the correct script can be sent to pharmacy, unable to [facility]

## 2020-10-07 NOTE — Addendum Note (Signed)
Addended by: Wynema Birch on: 10/07/2020 12:46 PM   Modules accepted: Orders

## 2020-10-07 NOTE — Telephone Encounter (Signed)
Was able to get in contact with pt's daughter Dustin Harrison (number on file is incorrect, updated number). Dustin Harrison is taking amlodipine 5 mg BID as of recently and that is how Dustin Harrison has been filling Dustin Harrison medication container. Medication was updated in pt's med list, new script sent into pharmacy.  Also called [facility] pharmacy to give update of medication, Dustin Harrison also request for 90 day supply. Script sent in.

## 2020-10-10 ENCOUNTER — Other Ambulatory Visit
Admission: RE | Admit: 2020-10-10 | Discharge: 2020-10-10 | Disposition: A | Discharge status: Home / Self Care | Payer: Medicare PPO | Attending: Family | Admitting: Family

## 2020-10-10 ENCOUNTER — Other Ambulatory Visit: Payer: Self-pay

## 2020-10-10 DIAGNOSIS — I25118 Atherosclerotic heart disease of native coronary artery with other forms of angina pectoris: Secondary | ICD-10-CM | POA: Diagnosis not present

## 2020-10-10 DIAGNOSIS — Z79899 Other long term (current) drug therapy: Secondary | ICD-10-CM | POA: Diagnosis not present

## 2020-10-10 DIAGNOSIS — I5020 Unspecified systolic (congestive) heart failure: Secondary | ICD-10-CM | POA: Diagnosis not present

## 2020-10-10 LAB — BASIC METABOLIC PANEL
Anion gap: 10 (ref 5–15)
BUN: 56 mg/dL — ABNORMAL HIGH (ref 8–23)
CO2: 24 mmol/L (ref 22–32)
Calcium: 9.1 mg/dL (ref 8.9–10.3)
Chloride: 103 mmol/L (ref 98–111)
Creatinine, Ser: 1.58 mg/dL — ABNORMAL HIGH (ref 0.61–1.24)
GFR, Estimated: 42 mL/min — ABNORMAL LOW (ref >60)
Glucose, Bld: 203 mg/dL — ABNORMAL HIGH (ref 70–99)
Potassium: 4.6 mmol/L (ref 3.5–5.1)
Sodium: 137 mmol/L (ref 135–145)

## 2020-10-18 NOTE — Assessment & Plan Note (Signed)
Can use Meloxicam as needed for toe pain.  Keep area clean and dry.  Complete Augmentin course.  Can use probiotic and yogurt for GI balance.  

## 2020-10-23 ENCOUNTER — Other Ambulatory Visit: Payer: Self-pay | Admitting: Family Medicine

## 2020-10-28 ENCOUNTER — Ambulatory Visit: Payer: Medicare PPO | Admitting: Cardiovascular Disease

## 2020-10-28 ENCOUNTER — Telehealth: Payer: Self-pay | Admitting: *Deleted

## 2020-10-28 NOTE — Telephone Encounter (Signed)
Left voicemail message to call back for review of results.  

## 2020-10-28 NOTE — Telephone Encounter (Signed)
-----   Message from Loel Dubonnet, NP sent at 10/27/2020  8:29 AM EDT ----- Preliminary report of ZIO monitor discussed at most recent clinic visit. Final report shows sinus rhythm and some junctional rhythm which is a stable finding. 16 runs of a fast heart beat which could cause occasional palpitations. Occasional early beats. Overall no significant arrhythmia nor pause. Continue present medications.

## 2020-10-29 NOTE — Telephone Encounter (Signed)
Patient returning call.

## 2020-10-30 NOTE — Telephone Encounter (Signed)
Able to reach pt regarding his recent Zio, Laurann Montana, NP had a chance to review Zio results and advised   "Preliminary report of ZIO monitor discussed at most recent clinic visit. Final report shows sinus rhythm and some junctional rhythm which is a stable finding. 16 runs of a fast heart beat which could cause occasional palpitations. Occasional early beats. Overall no significant arrhythmia nor pause. Continue present medications"  Pt and wife both thankful for the results call, no questions at this time, will f/u as schedule.

## 2020-11-13 ENCOUNTER — Other Ambulatory Visit: Payer: Self-pay | Admitting: Family Medicine

## 2020-11-13 NOTE — Telephone Encounter (Signed)
Last office visit 09/18/2020 for diabetic infection right foot.  Last refilled 03/20/2020 for #180 with no refills.  Next Appt: 12/12/2020 for follow up.

## 2020-12-12 ENCOUNTER — Ambulatory Visit: Payer: Medicare PPO | Admitting: Family Medicine

## 2020-12-12 ENCOUNTER — Encounter: Payer: Self-pay | Admitting: Family Medicine

## 2020-12-12 ENCOUNTER — Other Ambulatory Visit: Payer: Self-pay

## 2020-12-12 VITALS — BP 132/84 | HR 63 | Temp 97.8°F | Ht 71.25 in | Wt 183.0 lb

## 2020-12-12 DIAGNOSIS — E1159 Type 2 diabetes mellitus with other circulatory complications: Secondary | ICD-10-CM

## 2020-12-12 DIAGNOSIS — I5020 Unspecified systolic (congestive) heart failure: Secondary | ICD-10-CM | POA: Diagnosis not present

## 2020-12-12 LAB — POCT GLYCOSYLATED HEMOGLOBIN (HGB A1C): Hemoglobin A1C: 6.3 % — AB (ref 4.0–5.6)

## 2020-12-12 LAB — HM DIABETES FOOT EXAM

## 2020-12-12 NOTE — Progress Notes (Signed)
Patient ID: Dustin Harrison, male    DOB: 1931/11/05, 87 y.o.   MRN: [id number]  This visit was conducted in person.  BP 132/84   Pulse 63   Temp 97.8 F (36.6 C) (Temporal)   Ht 5' 11.25" (1.81 m)   Wt 183 lb (83 kg)   SpO2 98%   BMI 25.34 kg/m    CC:.cc Subjective:   HPI: Dustin Harrison is a 87 y.o. male presenting on 12/12/2020 for Diabetes  Diabetes:   Well controlled on metformin and jardiance Lab Results  Component Value Date   HGBA1C 6.3 (A) 12/12/2020  Using medications without difficulties: none Hypoglycemic episodes:none Hyperglycemic episodes: Feet problems: no ulcers Blood Sugars averaging: not checking eye exam within last year: yes  Energy is improved. He is back to exercise at [facility].    BP Readings from Last 3 Encounters:  12/12/20 132/84  10/01/20 140/70  09/18/20 122/60   Wt Readings from Last 3 Encounters:  12/12/20 183 lb (83 kg)  10/01/20 185 lb (83.9 kg)  09/18/20 183 lb 12 oz (83.3 kg)        Relevant past medical, surgical, family and social history reviewed and updated as indicated. Interim medical history since our last visit reviewed. Allergies and medications reviewed and updated. Outpatient Medications Prior to Visit  Medication Sig Dispense Refill   amLODipine (NORVASC) 5 MG tablet Take 1 tablet (5 mg total) by mouth in the morning and at bedtime. 180 tablet 3   aspirin 81 MG EC tablet Take 81 mg by mouth daily. Swallow whole.     atorvastatin (LIPITOR) 20 MG tablet TAKE ONE TABLET EVERY DAY 90 tablet 3   buPROPion (WELLBUTRIN XL) 300 MG 24 hr tablet Take 300 mg by mouth daily.     finasteride (PROSCAR) 5 MG tablet TAKE ONE TABLET EVERY DAY 90 tablet 1   JARDIANCE 10 MG TABS tablet TAKE ONE TABLET EVERY DAY BEFORE BREAKFAST 90 tablet 3   latanoprost (XALATAN) 0.005 % ophthalmic solution Place 1 drop into both eyes at bedtime.     losartan (COZAAR) 100 MG tablet Take 1 tablet (100 mg total) by mouth daily. 90 tablet 1    meloxicam (MOBIC) 7.5 MG tablet TAKE ONE TABLET BY MOUTH EVERY MORNING AND AT BEDTIME 180 tablet 0   metFORMIN (GLUCOPHAGE) 500 MG tablet TAKE ONE TABLET BY MOUTH TWICE DAILY WITH A MEAL 180 tablet 1   mirtazapine (REMERON) 15 MG tablet TAKE ONE TABLET BY MOUTH AT BEDTIME 90 tablet 1   pantoprazole (PROTONIX) 20 MG tablet TAKE 1 TABLET BY MOUTH DAILY 90 tablet 3   polyethylene glycol powder (GLYCOLAX/MIRALAX) 17 GM/SCOOP powder Take 1 Container by mouth daily as needed for mild constipation or moderate constipation.     tamsulosin (FLOMAX) 0.4 MG CAPS capsule TAKE 2 CAPSULES EVERY DAY 180 capsule 3   timolol (TIMOPTIC) 0.5 % ophthalmic solution Place 1 drop into both eyes 2 (two) times daily.     torsemide (DEMADEX) 20 MG tablet Take 20 mg by mouth daily.     Multiple Vitamins-Minerals (MULTIVITAMIN WITH MINERALS) tablet Take 1 tablet by mouth daily.     No facility-administered medications prior to visit.     Per HPI unless specifically indicated in ROS section below Review of Systems  Constitutional:  Negative for fatigue and fever.  HENT:  Negative for ear pain.   Eyes:  Negative for pain.  Respiratory:  Negative for cough and shortness of breath.  Cardiovascular:  Negative for chest pain, palpitations and leg swelling.  Gastrointestinal:  Negative for abdominal pain.  Genitourinary:  Negative for dysuria.  Musculoskeletal:  Negative for arthralgias.  Neurological:  Negative for syncope, light-headedness and headaches.  Psychiatric/Behavioral:  Negative for dysphoric mood.   Objective:  BP 132/84   Pulse 63   Temp 97.8 F (36.6 C) (Temporal)   Ht 5' 11.25" (1.81 m)   Wt 183 lb (83 kg)   SpO2 98%   BMI 25.34 kg/m   Wt Readings from Last 3 Encounters:  12/12/20 183 lb (83 kg)  10/01/20 185 lb (83.9 kg)  09/18/20 183 lb 12 oz (83.3 kg)      Physical Exam Constitutional:      Appearance: He is well-developed.     Comments: Elderly male in NAD  HENT:     Head:  Normocephalic.     Right Ear: Hearing normal.     Left Ear: Hearing normal.     Nose: Nose normal.  Neck:     Thyroid: No thyroid mass or thyromegaly.     Vascular: No carotid bruit.     Trachea: Trachea normal.  Cardiovascular:     Rate and Rhythm: Normal rate and regular rhythm.     Pulses: Normal pulses.     Heart sounds: Heart sounds not distant. No murmur heard.   No friction rub. No gallop.     Comments: No peripheral edema Pulmonary:     Effort: Pulmonary effort is normal. No respiratory distress.     Breath sounds: Normal breath sounds.  Skin:    General: Skin is warm and dry.     Findings: No rash.  Psychiatric:        Speech: Speech normal.        Behavior: Behavior normal.        Thought Content: Thought content normal.    Diabetic foot exam: Normal inspection No skin breakdown No calluses  Normal DP pulses Normal sensation to light touch and monofilament Nails normal      Results for orders placed or performed in visit on 12/12/20  POCT glycosylated hemoglobin (Hb A1C)  Result Value Ref Range   Hemoglobin A1C 6.3 (A) 4.0 - 5.6 %   HbA1c POC (<> result, manual entry)     HbA1c, POC (prediabetic range)     HbA1c, POC (controlled diabetic range)      This visit occurred during the SARS-CoV-2 public health emergency.  Safety protocols were in place, including screening questions prior to the visit, additional usage of staff PPE, and extensive cleaning of exam room while observing appropriate contact time as indicated for disinfecting solutions.   COVID 19 screen:  No recent travel or known exposure to COVID19 The patient denies respiratory symptoms of COVID 19 at this time. The importance of social distancing was discussed today.   Assessment and Plan  Problem List Items Addressed This Visit     Diabetes mellitus with cardiac complication ([place]) - Primary (Chronic)    Stable, chronic.  Continue current medication.    Metformin 500 mg BID  Jardiance  10 mg daily.      Relevant Orders   POCT glycosylated hemoglobin (Hb A1C) (Completed)   HFrEF (heart failure with reduced ejection fraction) (HCC) (Chronic)    Followed by cardiology. Euvolemic in office today.        Eliezer Lofts, MD

## 2020-12-12 NOTE — Patient Instructions (Signed)
Keep  up great work with healthy eating and regular exercise.

## 2021-01-22 DIAGNOSIS — H401132 Primary open-angle glaucoma, bilateral, moderate stage: Secondary | ICD-10-CM | POA: Diagnosis not present

## 2021-01-22 LAB — HM DIABETES EYE EXAM

## 2021-01-27 ENCOUNTER — Encounter: Payer: Self-pay | Admitting: Family Medicine

## 2021-01-29 ENCOUNTER — Other Ambulatory Visit: Payer: Self-pay | Admitting: Family Medicine

## 2021-01-30 NOTE — Progress Notes (Signed)
Date:  02/02/2021   ID:  Elicia Lamp, DOB 1932/04/25, MRN [id number]  Patient Location:  [address] [place] [address]   Provider location:   [facility], [place] office  PCP:  Jinny Sanders, MD  Cardiologist:  Patsy Baltimore   Chief Complaint  Patient presents with   4 month follow up     Patient c/o LE edema. Medications reviewed by the patient verbally.     History of Present Illness:    Shayan Kowitz is a 87 y.o. male past medical history of  HFrEF, bradycardia, h/o pheochromocytoma, R adrenal tumor s/p R adrenalectomy, cochlear implant on left 06/2018,  Dm2,  chronic back pain,  bacteremia 10/2019 s/p TEE and IV abx, LVEF 25-35% CKD,  CAD,  aortic sclerosis  syncope  Echo 01/2020: Ef 60% Who presents for f/u of his cardiomyopathy, syncope,  CAD, aortic valve sclerosis  In follow-up today, reports doing well Has recovered from sepsis in 2021  Active at baseline, swimming, walking Lives at [facility]  Not taking torsemide, reports having stable ankle edema Labs reviewed A1c 6.3 CR 1.58  EKG personally reviewed by myself on todays visit Normal sinus rhythm rate 60 bpm no significant ST-T wave changes  Other past medical history reviewed admitted 10/09/19 in setting of positive cultures for GBS bacteremia. Source of infection not identified.   TEE with LVEF 25-35%, gr1DD, significant wall motion abnormalities with images suggestive of stress induced CM versus LAD infarct.  TEE with no evidence of vegetation/endocarditis   He was started on feeding tube due to R vocal cord paralysis. Noted 2.2cm intraluminal density which would require further GI workup.    Seen in clinic 11/26/19 noting recent syncopal episode with LOC for 10 seconds.    Cardiac cath 11/28/19 with severe single-vessel coronary disease with 90% ostial stenosis involving small ramus intermedius that fills via right-to-left collaterals, mild to moderate  nonobstructive CAD involving LAD, LCx, RCA, upper normal to mildly elevated L/R heart filling pressures, mild to moderate AS.    echocardiogram 01/14/2020 with EF  normalized with LVEF 60 to 65%, no RWMA, mild LVH, RV normal size and function, trivial MR, mild dilation of ascending aorta 39 mm.    R/LHC 11/28/19  Severe single-vessel coronary artery disease with 90% ostial stenosis involving small ramus intermedius branch that also fills via right-to-left collaterals. Mild to moderate, non-obstructive coronary artery disease involving the LAD, LCx, and RCA. Upper normal to mildly elevated left and right heart filling pressures. Hyperdynamic left ventricular contraction. Mild to moderate aortic stenosis.      TTE 10/08/19  1. Left ventricular ejection fraction, by estimation, is 25 to 30%. The  left ventricle has severely decreased function. The left ventricle has no  regional wall motion abnormalities. There is moderate left ventricular  hypertrophy. Left ventricular  diastolic parameters are consistent with Grade I diastolic dysfunction  (impaired relaxation). There is severe akinesis of the left ventricular,  mid-apical anteroseptal wall, anterolateral wall, anterior segment, apical  segment and inferoseptal wall. Wall   motion abnormalities are suggestive of stress induced cardiomyopathy vs.  LAD infarct.   2. Right ventricular systolic function is normal. The right ventricular  size is normal. There is mildly elevated pulmonary artery systolic  pressure.   3. Left atrial size was mildly dilated.   4. The mitral valve is normal in structure. No evidence of mitral valve  regurgitation. No evidence of mitral stenosis.  5. The aortic valve is normal in structure. Aortic valve regurgitation is  not visualized. Mild to moderate aortic valve sclerosis/calcification is  present, without any evidence of aortic stenosis.   6. The inferior vena cava is normal in size with greater than 50%   respiratory variability, suggesting right atrial pressure of 3 mmHg.   7. No clear vegetations but suboptimal study.    TEE 10/12/19  1. Left ventricular ejection fraction, by estimation, is 25 to 30%. The  left ventricle has severely decreased function. The left ventricle  demonstrates global hypokinesis.   2. Right ventricular systolic function is normal. The right ventricular  size is normal. Tricuspid regurgitation signal is inadequate for assessing  PA pressure.   3. The mitral valve is normal in structure. No evidence of mitral valve  regurgitation.   4. Aortic valve regurgitation is not visualized. Severe calcification of  aortic valve. Visually, there appears to be moderate aortic valve  stenosis.   5. Grossly, no valve vegetation noted.    Past Medical History:  Diagnosis Date   Abnormal glucose    Arthritis    Baker's cyst of knee    left   BPH (benign prostatic hypertrophy)    Cough    because of "tight" esophagus   Diabetes mellitus    Glaucoma    HOH (hard of hearing)    Hypertension    Pheochromocytoma of right adrenal gland    Stroke [facility])    Ulcer    Wears hearing aid    bilateral   Past Surgical History:  Procedure Laterality Date   adrenaletomy     CARDIAC CATHETERIZATION     CATARACT EXTRACTION W/PHACO Left 10/08/2015   Procedure: CATARACT EXTRACTION PHACO AND INTRAOCULAR LENS PLACEMENT ([place]) left eye;  Surgeon: Leandrew Koyanagi, MD;  Location: [place];  Service: Ophthalmology;  Laterality: Left;  [place]     RIGHT/LEFT HEART CATH AND CORONARY ANGIOGRAPHY Left 11/28/2019   Procedure: RIGHT/LEFT HEART CATH AND CORONARY ANGIOGRAPHY;  Surgeon: Nelva Bush, MD;  Location: [place] CV LAB;  Service: Cardiovascular;  Laterality: Left;   SQUAMOUS CELL CARCINOMA EXCISION  03-2006   left ear   TEE WITHOUT CARDIOVERSION N/A 10/12/2019   Procedure: TRANSESOPHAGEAL ECHOCARDIOGRAM (TEE);  Surgeon: Minna Merritts, MD;   Location: [facility] ORS;  Service: Cardiovascular;  Laterality: N/A;   TONSILLECTOMY     XI ROBOTIC LAPAROSCOPIC ASSISTED APPENDECTOMY N/A 10/11/2019   Procedure: XI ROBOTIC ASSISTED LAPAROSCOPIC INSERTION GASTROSTOMY TUBE;  Surgeon: Jules Husbands, MD;  Location: [facility] ORS;  Service: General;  Laterality: N/A;      Allergies:   Patient has no known allergies.   Social History   Tobacco Use   Smoking status: Never   Smokeless tobacco: Never  Vaping Use   Vaping Use: Never used  Substance Use Topics   Alcohol use: Not Currently    Alcohol/week: 1.0 standard drink    Types: 1 Glasses of wine per week    Comment: 1 glass per week   Drug use: No     Current Outpatient Medications on File Prior to Visit  Medication Sig Dispense Refill   amLODipine (NORVASC) 5 MG tablet Take 1 tablet (5 mg total) by mouth in the morning and at bedtime. 180 tablet 3   aspirin 81 MG EC tablet Take 81 mg by mouth daily. Swallow whole.     atorvastatin (LIPITOR) 20 MG tablet TAKE ONE TABLET EVERY DAY 90 tablet 3  finasteride (PROSCAR) 5 MG tablet TAKE ONE TABLET EVERY DAY 90 tablet 1   JARDIANCE 10 MG TABS tablet TAKE ONE TABLET EVERY DAY BEFORE BREAKFAST 90 tablet 3   latanoprost (XALATAN) 0.005 % ophthalmic solution Place 1 drop into both eyes at bedtime.     losartan (COZAAR) 100 MG tablet Take 1 tablet (100 mg total) by mouth daily. 90 tablet 1   meloxicam (MOBIC) 7.5 MG tablet TAKE ONE TABLET BY MOUTH EVERY MORNING AND AT BEDTIME 180 tablet 0   metFORMIN (GLUCOPHAGE) 500 MG tablet TAKE ONE TABLET BY MOUTH TWICE DAILY WITH A MEAL 180 tablet 1   mirtazapine (REMERON) 15 MG tablet TAKE ONE TABLET BY MOUTH AT BEDTIME 90 tablet 1   pantoprazole (PROTONIX) 20 MG tablet TAKE 1 TABLET BY MOUTH DAILY 90 tablet 3   polyethylene glycol powder (GLYCOLAX/MIRALAX) 17 GM/SCOOP powder Take 1 Container by mouth daily as needed for mild constipation or moderate constipation.     tamsulosin (FLOMAX) 0.4 MG CAPS capsule TAKE 2  CAPSULES EVERY DAY 180 capsule 3   timolol (TIMOPTIC) 0.5 % ophthalmic solution Place 1 drop into both eyes 2 (two) times daily.     buPROPion (WELLBUTRIN XL) 300 MG 24 hr tablet Take 300 mg by mouth daily. (Patient not taking: Reported on 02/02/2021)     torsemide (DEMADEX) 20 MG tablet Take 20 mg by mouth daily. (Patient not taking: Reported on 02/02/2021)     No current facility-administered medications on file prior to visit.     Family Hx: The patient's family history includes Alcohol abuse in his mother; Brain cancer in his father; Coronary artery disease in his mother; Diabetes in his brother.  ROS:   Please see the history of present illness.    Review of Systems  Constitutional: Negative.   HENT: Negative.    Respiratory: Negative.    Cardiovascular: Negative.   Gastrointestinal: Negative.   Musculoskeletal: Negative.   Neurological: Negative.   Psychiatric/Behavioral: Negative.    All other systems reviewed and are negative.    Labs/Other Tests and Data Reviewed:    Recent Labs: 05/28/2020: ALT 32 09/15/2020: Hemoglobin 13.8; Platelets 226; TSH 2.660 10/10/2020: BUN 56; Creatinine, Ser 1.58; Potassium 4.6; Sodium 137   Recent Lipid Panel Lab Results  Component Value Date/Time   CHOL 170 05/28/2020 07:53 AM   TRIG 49.0 05/28/2020 07:53 AM   HDL 89.30 05/28/2020 07:53 AM   CHOLHDL 2 05/28/2020 07:53 AM   LDLCALC 71 05/28/2020 07:53 AM   LDLDIRECT 126.2 04/05/2013 07:39 AM    Wt Readings from Last 3 Encounters:  02/02/21 184 lb 4 oz (83.6 kg)  12/12/20 183 lb (83 kg)  10/01/20 185 lb (83.9 kg)     Exam:   BP 130/62 (BP Location: Left Arm, Patient Position: Sitting, Cuff Size: Normal)   Pulse 60   Ht '5\' 11"'$  (1.803 m)   Wt 184 lb 4 oz (83.6 kg)   SpO2 97%   BMI 25.70 kg/m   Constitutional:  oriented to person, place, and time. No distress.  HENT:  Head: Grossly normal Eyes:  no discharge. No scleral icterus.  Neck: No JVD, no carotid bruits   Cardiovascular: Regular rate and rhythm, no murmurs appreciated Pulmonary/Chest: Clear to auscultation bilaterally, no wheezes or rails Abdominal: Soft.  no distension.  no tenderness.  Musculoskeletal: Normal range of motion Neurological:  normal muscle tone. Coordination normal. No atrophy Skin: Skin warm and dry Psychiatric: normal affect, pleasant   ASSESSMENT & PLAN:  Problem List Items Addressed This Visit       Cardiology Problems   HFrEF (heart failure with reduced ejection fraction) (HCC) (Chronic)   Other Visit Diagnoses     Coronary artery disease of native artery of native heart with stable angina pectoris ([place])    -  Primary   Lightheadedness       Essential hypertension       Hyperlipidemia LDL goal <70         CAD with stable angina Currently with no symptoms of angina. No further workup at this time. Continue current medication regimen.  Cardiomyopathy Ejection fraction fully recovered No further work-up needed at this time  HTN Blood pressure is well controlled on today's visit. No changes made to the medications.  Bacteremia: Recovered well, active at [facility]   Total encounter time more than 25 minutes  Greater than 50% was spent in counseling and coordination of care with the patient     Signed, Ida Rogue, [place] [address], [place], [place] [address]

## 2021-02-02 ENCOUNTER — Ambulatory Visit: Payer: Medicare PPO | Admitting: Cardiovascular Disease

## 2021-02-02 ENCOUNTER — Encounter: Payer: Self-pay | Admitting: Cardiovascular Disease

## 2021-02-02 ENCOUNTER — Other Ambulatory Visit: Payer: Self-pay

## 2021-02-02 VITALS — BP 130/62 | HR 60 | Ht 71.0 in | Wt 184.2 lb

## 2021-02-02 DIAGNOSIS — I10 Essential (primary) hypertension: Secondary | ICD-10-CM

## 2021-02-02 DIAGNOSIS — E785 Hyperlipidemia, unspecified: Secondary | ICD-10-CM | POA: Diagnosis not present

## 2021-02-02 DIAGNOSIS — I25118 Atherosclerotic heart disease of native coronary artery with other forms of angina pectoris: Secondary | ICD-10-CM | POA: Diagnosis not present

## 2021-02-02 DIAGNOSIS — R42 Dizziness and giddiness: Secondary | ICD-10-CM

## 2021-02-02 DIAGNOSIS — I5020 Unspecified systolic (congestive) heart failure: Secondary | ICD-10-CM

## 2021-02-02 MED ORDER — EZETIMIBE 10 MG PO TABS: 10.0000 mg | ORAL_TABLET | Freq: Every day | ORAL | 3 refills | Status: DC

## 2021-02-02 MED ORDER — LOSARTAN POTASSIUM 100 MG PO TABS: 100.0000 mg | ORAL_TABLET | Freq: Every day | ORAL | 3 refills | Status: DC

## 2021-02-02 MED ORDER — TORSEMIDE 20 MG PO TABS: 20.0000 mg | ORAL_TABLET | Freq: Every day | ORAL | 3 refills | Status: DC | PRN

## 2021-02-02 MED ORDER — AMLODIPINE BESYLATE 5 MG PO TABS
5.0000 mg | ORAL_TABLET | Freq: Two times a day (BID) | ORAL | 3 refills | Status: DC
Start: 2021-02-02 — End: 2022-03-08

## 2021-02-02 MED ORDER — ATORVASTATIN CALCIUM 20 MG PO TABS: 20.0000 mg | ORAL_TABLET | Freq: Every day | ORAL | 3 refills | Status: DC

## 2021-02-02 NOTE — Patient Instructions (Addendum)
Medication Instructions:  Please START Zetia 10 mg daily for cholesterol Stay on lipitor 20 mg daily with Zetia  Torsemide 20 mg daily AS NEEDED For increased ankle swelling Shortness of breath  If you need a refill on your cardiac medications before your next appointment, please call your pharmacy.    Lab work: No new labs needed  Testing/Procedures: No new testing needed  Follow-Up: At [facility], you and your health needs are our priority.  As part of our continuing mission to provide you with exceptional heart care, we have created designated Provider Care Teams.  These Care Teams include your primary Cardiologist (physician) and Advanced Practice Providers (APPs -  Physician Assistants and Nurse Practitioners) who all work together to provide you with the care you need, when you need it.  You will need a follow up appointment in 12 months  Providers on your designated Care Team:   Murray Hodgkins, NP Christell Faith, PA-C Marrianne Mood, PA-C [place] [place], [place]   COVID-19 Vaccine Information can be found at: ShippingScam.co.uk For questions related to vaccine distribution or appointments, please email [email or web address] or call [phone].

## 2021-02-07 ENCOUNTER — Other Ambulatory Visit: Payer: Self-pay | Admitting: Family Medicine

## 2021-02-08 NOTE — Telephone Encounter (Signed)
Pharmacy states patient is now taking 7.5 mg tablets and in asking for new Rx for that dose.  Mirtazapine 15 mg one tablet daily is on current medication list.  Ok to change dose?

## 2021-02-17 NOTE — Assessment & Plan Note (Signed)
Followed by cardiology. Euvolemic in office today.

## 2021-02-17 NOTE — Assessment & Plan Note (Signed)
Stable, chronic.  Continue current medication.    Metformin 500 mg BID  Jardiance 10 mg daily.

## 2021-02-23 ENCOUNTER — Other Ambulatory Visit: Payer: Self-pay | Admitting: Family Medicine

## 2021-02-23 NOTE — Telephone Encounter (Signed)
Last office visit 12/12/2020 for DM.  Last refilled 11/13/2020 for #180 with no refills.  Next Appt: 06/17/2021 with Dr. Silvio Pate for TOC.

## 2021-04-01 ENCOUNTER — Ambulatory Visit: Payer: Medicare PPO | Admitting: Internal Medicine

## 2021-04-14 ENCOUNTER — Other Ambulatory Visit: Payer: Self-pay | Admitting: Family Medicine

## 2021-05-06 DIAGNOSIS — D485 Neoplasm of uncertain behavior of skin: Secondary | ICD-10-CM | POA: Diagnosis not present

## 2021-05-06 DIAGNOSIS — C44719 Basal cell carcinoma of skin of left lower limb, including hip: Secondary | ICD-10-CM | POA: Diagnosis not present

## 2021-05-06 DIAGNOSIS — Z85828 Personal history of other malignant neoplasm of skin: Secondary | ICD-10-CM | POA: Diagnosis not present

## 2021-05-06 DIAGNOSIS — D2262 Melanocytic nevi of left upper limb, including shoulder: Secondary | ICD-10-CM | POA: Diagnosis not present

## 2021-05-06 DIAGNOSIS — C44612 Basal cell carcinoma of skin of right upper limb, including shoulder: Secondary | ICD-10-CM | POA: Diagnosis not present

## 2021-05-06 DIAGNOSIS — D2261 Melanocytic nevi of right upper limb, including shoulder: Secondary | ICD-10-CM | POA: Diagnosis not present

## 2021-05-06 DIAGNOSIS — L821 Other seborrheic keratosis: Secondary | ICD-10-CM | POA: Diagnosis not present

## 2021-05-06 DIAGNOSIS — L570 Actinic keratosis: Secondary | ICD-10-CM | POA: Diagnosis not present

## 2021-05-06 DIAGNOSIS — D2272 Melanocytic nevi of left lower limb, including hip: Secondary | ICD-10-CM | POA: Diagnosis not present

## 2021-05-06 DIAGNOSIS — X32XXXA Exposure to sunlight, initial encounter: Secondary | ICD-10-CM | POA: Diagnosis not present

## 2021-05-13 DIAGNOSIS — H903 Sensorineural hearing loss, bilateral: Secondary | ICD-10-CM | POA: Diagnosis not present

## 2021-06-03 ENCOUNTER — Other Ambulatory Visit: Payer: Self-pay | Admitting: Family Medicine

## 2021-06-04 NOTE — Progress Notes (Signed)
Subjective:   Dustin Harrison is a 87 y.o. male who presents for Medicare Annual/Subsequent preventive examination.  I connected with Dustin Harrison today by telephone and verified that I am speaking with the correct person using two identifiers. Location patient: home Location provider: work Persons participating in the virtual visit: patient, Marine scientist.    I discussed the limitations, risks, security and privacy concerns of performing an evaluation and management service by telephone and the availability of in person appointments. I also discussed with the patient that there may be a patient responsible charge related to this service. The patient expressed understanding and verbally consented to this telephonic visit.    Interactive audio and video telecommunications were attempted between this provider and patient, however failed, due to patient having technical difficulties OR patient did not have access to video capability.  We continued and completed visit with audio only.  Some vital signs may be absent or patient reported.   Time Spent with patient on telephone encounter: 20 minutes  Review of Systems     Cardiac Risk Factors include: advanced age (>79men, >36 women);diabetes mellitus;hypertension;dyslipidemia     Objective:    Today's Vitals   06/09/21 0818  Weight: 184 lb (83.5 kg)  Height: 5\' 11"  (1.803 m)   Body mass index is 25.66 kg/m.  Advanced Directives 06/09/2021 06/04/2020 02/27/2020 11/28/2019 10/11/2019 10/07/2019 05/10/2018  Does Patient Have a Medical Advance Directive? Yes Yes Yes Yes No Yes Yes  Type of Paramedic of [place];Living will [place];Living will Living will;Healthcare Power of [place];Living will  Does patient want to make changes to medical advance directive? Yes (MAU/Ambulatory/Procedural Areas - Information given)  - No - Patient declined No - Patient declined No - Patient declined No - Patient declined -  Copy of [place] in Chart? - No - copy requested No - copy requested No - copy requested - No - copy requested No - copy requested  Would patient like information on creating a medical advance directive? - - - - No - Patient declined - -    Current Medications (verified) Outpatient Encounter Medications as of 06/09/2021  Medication Sig   amLODipine (NORVASC) 5 MG tablet Take 1 tablet (5 mg total) by mouth in the morning and at bedtime.   aspirin 81 MG EC tablet Take 81 mg by mouth daily. Swallow whole.   atorvastatin (LIPITOR) 20 MG tablet Take 1 tablet (20 mg total) by mouth daily.   ezetimibe (ZETIA) 10 MG tablet Take 1 tablet (10 mg total) by mouth daily.   finasteride (PROSCAR) 5 MG tablet TAKE ONE TABLET EVERY DAY   JARDIANCE 10 MG TABS tablet TAKE ONE TABLET EVERY DAY BEFORE BREAKFAST   latanoprost (XALATAN) 0.005 % ophthalmic solution Place 1 drop into both eyes at bedtime.   meloxicam (MOBIC) 7.5 MG tablet TAKE ONE TABLET BY MOUTH EVERY MORNING AND AT BEDTIME   metFORMIN (GLUCOPHAGE) 500 MG tablet TAKE ONE TABLET BY MOUTH TWICE DAILY WITH A MEAL   mirtazapine (REMERON) 7.5 MG tablet Take 1 tablet (7.5 mg total) by mouth at bedtime.   pantoprazole (PROTONIX) 20 MG tablet TAKE 1 TABLET BY MOUTH DAILY   polyethylene glycol powder (GLYCOLAX/MIRALAX) 17 GM/SCOOP powder Take 1 Container by mouth daily as needed for mild constipation or moderate constipation.   tamsulosin (FLOMAX) 0.4 MG CAPS capsule TAKE 2 CAPSULES EVERY DAY  timolol (TIMOPTIC) 0.5 % ophthalmic solution Place 1 drop into both eyes 2 (two) times daily.   torsemide (DEMADEX) 20 MG tablet Take 1 tablet (20 mg total) by mouth daily as needed. For increased ankle swelling   buPROPion (WELLBUTRIN XL) 300 MG 24 hr tablet Take 300 mg by mouth daily. (Patient not taking: Reported on 02/02/2021)   losartan (COZAAR) 100 MG  tablet Take 1 tablet (100 mg total) by mouth daily.   No facility-administered encounter medications on file as of 06/09/2021.    Allergies (verified) Patient has no known allergies.   History: Past Medical History:  Diagnosis Date   Abnormal glucose    Arthritis    Baker's cyst of knee    left   BPH (benign prostatic hypertrophy)    Cough    because of "tight" esophagus   Diabetes mellitus    Glaucoma    HOH (hard of hearing)    Hypertension    Pheochromocytoma of right adrenal gland    Stroke [facility])    Ulcer    Wears hearing aid    bilateral   Past Surgical History:  Procedure Laterality Date   adrenaletomy     CARDIAC CATHETERIZATION     CATARACT EXTRACTION W/PHACO Left 10/08/2015   Procedure: CATARACT EXTRACTION PHACO AND INTRAOCULAR LENS PLACEMENT ([place]) left eye;  Surgeon: Leandrew Koyanagi, MD;  Location: [place];  Service: Ophthalmology;  Laterality: Left;  [place]     RIGHT/LEFT HEART CATH AND CORONARY ANGIOGRAPHY Left 11/28/2019   Procedure: RIGHT/LEFT HEART CATH AND CORONARY ANGIOGRAPHY;  Surgeon: Nelva Bush, MD;  Location: [place] [facility];  Service: Cardiovascular;  Laterality: Left;   SQUAMOUS CELL CARCINOMA EXCISION  03-2006   left ear   TEE WITHOUT CARDIOVERSION N/A 10/12/2019   Procedure: TRANSESOPHAGEAL ECHOCARDIOGRAM (TEE);  Surgeon: Minna Merritts, MD;  Location: [facility] OR[facility];  Service: Cardiovascular;  Laterality: N/A;   TONSILLECTOMY     XI ROBOTIC LAPAROSCOPIC ASSISTED APPENDECTOMY N/A 10/11/2019   Procedure: XI ROBOTIC ASSISTED LAPAROSCOPIC INSERTION GASTROSTOMY TUBE;  Surgeon: Jules Husbands, MD;  Location: [facility] OR[facility];  Service: General;  Laterality: N/A;   Family History  Problem Relation Age of Onset   Alcohol abuse Mother    Coronary artery disease Mother    Brain cancer Father        tumor   Diabetes Brother    Social History   Socioeconomic History   Marital status: Married    Spouse name: Not on file    Number of children: Not on file   Years of education: Not on file   Highest education level: Not on file  Occupational History   Occupation: chaplain  Tobacco Use   Smoking status: Never   Smokeless tobacco: Never  Vaping Use   Vaping Use: Never used  Substance and Sexual Activity   Alcohol use: Not Currently    Alcohol/week: 1.0 standard drink    Types: 1 Glasses of wine per week    Comment: 1 glass per week   Drug use: No   Sexual activity: Not Currently  Other Topics Concern   Not on file  Social History Narrative   Regular exercise- yes- 3-4 times a week treadmill/walk   Diet: fruits and veggies,water   Living will, HCPOA (wife) , full code (reviewed 68)   Social Determinants of Health   Financial Resource Strain: Low Risk    Difficulty of Paying Living Expenses: Not hard at all  Food  Insecurity: No Food Insecurity   Worried About Charity fundraiser in the Last Year: Never true   Ran Out of Food in the Last Year: Never true  Transportation Needs: No Transportation Needs   Lack of Transportation (Medical): No   Lack of Transportation (Non-Medical): No  Physical Activity: Insufficiently Active   Days of Exercise per Week: 2 days   Minutes of Exercise per Session: 60 min  Stress: No Stress Concern Present   Feeling of Stress : Not at all  Social Connections: Socially Integrated   Frequency of Communication with Friends and Family: More than three times a week   Frequency of Social Gatherings with Friends and Family: Three times a week   Attends Religious Services: More than 4 times per year   Active Member of Clubs or Organizations: Yes   Attends Music therapist: More than 4 times per year   Marital Status: Married    Tobacco Counseling Counseling given: Not Answered   Clinical Intake:  Pre-visit preparation completed: Yes  Pain : No/denies pain     BMI - recorded: 25.66 Nutritional Status: BMI 25 -29 Overweight Nutritional Risks:  None Diabetes: Yes CBG done?: No Did pt. bring in CBG monitor from home?: No  How often do you need to have someone help you when you read instructions, pamphlets, or other written materials from your doctor or pharmacy?: 1 - Never  Diabetes:  Is the patient diabetic?  Yes  If diabetic, was a CBG obtained today?  No  Did the patient bring in their glucometer from home?  No  How often do you monitor your CBG's? 1 time a month.   Financial Strains and Diabetes Management:  Are you having any financial strains with the device, your supplies or your medication? No .  Does the patient want to be seen by Chronic Care Management for management of their diabetes?  No  Would the patient like to be referred to a Nutritionist or for Diabetic Management?  No   Diabetic Exams:  Diabetic Eye Exam: Completed 01/22/21.   Diabetic Foot Exam: Completed 12/12/20.  Interpreter Needed?: No  Information entered by :: Orrin Brigham LPN   Activities of Daily Living In your present state of health, do you have any difficulty performing the following activities: 06/09/2021  Hearing? Y  Vision? N  Difficulty concentrating or making decisions? N  Walking or climbing stairs? N  Dressing or bathing? N  Doing errands, shopping? N  Preparing Food and eating ? N  Using the Toilet? N  In the past six months, have you accidently leaked urine? N  Do you have problems with loss of bowel control? N  Managing your Medications? N  Managing your Finances? N  Housekeeping or managing your Housekeeping? N  Some recent data might be hidden    Patient Care Team: [name], MD as PCP - General (Internal Medicine) Minna Merritts, MD as PCP - Cardiology (Cardiology) Leandrew Koyanagi, MD as Referring Physician (Ophthalmology) Dasher, Rayvon Char, MD as Referring Physician (Dermatology)  Indicate any recent Medical Services you may have received from other than [facility] providers in the past year (date may be  approximate).     Assessment:   This is a routine wellness examination for Dustin Harrison.  Hearing/Vision screen Hearing Screening - Comments:: Hearing loss, wear hearing aid Vision Screening - Comments:: Last exam 01/22/21, Dr Wallace Going, wears glasses  Dietary issues and exercise activities discussed: Current Exercise Habits: Home exercise routine;Structured  exercise class, Type of exercise: walking;Other - see comments (water aerobics), Time (Minutes): 60, Frequency (Times/Week): 2, Weekly Exercise (Minutes/Week): 120, Intensity: Moderate   Goals Addressed             This Visit's Progress    Patient Stated       Would like to maintain current routine.        Depression Screen PHQ 2/9 Scores 06/09/2021 06/04/2020 05/25/2019 05/10/2018 04/27/2017 04/23/2016 04/15/2015  PHQ - 2 Score 0 0 0 0 0 0 0  PHQ- 9 Score - 0 - 0 0 - -    Fall Risk Fall Risk  06/09/2021 06/04/2020 05/25/2019 05/10/2018 04/27/2017  Falls in the past year? 0 1 0 0 No  Number falls in past yr: 0 0 - - -  Injury with Fall? 0 0 - - -  Risk for fall due to : - Medication side effect;Impaired balance/gait - - -  Follow up Falls prevention discussed Falls evaluation completed;Falls prevention discussed - - -    FALL RISK PREVENTION PERTAINING TO THE HOME:  Any stairs in or around the home? Yes  If so, are there any without handrails? No  Home free of loose throw rugs in walkways, pet beds, electrical cords, etc? Yes  Adequate lighting in your home to reduce risk of falls? Yes   ASSISTIVE DEVICES UTILIZED TO PREVENT FALLS:  Life alert? No  Use of a cane, walker or w/c? Yes , walker  Grab bars in the bathroom? Yes  Shower chair or bench in shower? Yes  Elevated toilet seat or a handicapped toilet? No   TIMED UP AND GO:  Was the test performed? No .    Cognitive Function: Normal cognitive status assessed by this Nurse Health Advisor. No abnormalities found.   MMSE - Mini Mental State Exam 06/04/2020  05/10/2018 04/27/2017  Orientation to time 5 5 5   Orientation to Place 5 5 5   Registration 3 3 3   Attention/ Calculation 5 0 0  Recall 3 2 3   Recall-comments - unable to recall 1 of 3 words -  Language- name 2 objects - 0 0  Language- repeat 1 1 1   Language- follow 3 step command - 3 3  Language- read & follow direction - 0 0  Write a sentence - 0 0  Copy design - 0 0  Total score - 19 20        Immunizations Immunization History  Administered Date(s) Administered   Influenza Whole 02/25/2009, 01/30/2010, 02/11/2012   Influenza, High Dose Seasonal PF 01/25/2015, 02/17/2016, 03/03/2017, 03/06/2018, 02/10/2019, 02/11/2021   Influenza-Unspecified 01/09/2013, 02/21/2020   Moderna Covid-19 Vaccine Bivalent Booster 25yrs & up 02/11/2021   Moderna Sars-Covid-2 Vaccination 06/21/2019, 07/19/2019, 04/22/2020   Pneumococcal Conjugate-13 01/17/2014   Pneumococcal Polysaccharide-23 03/07/2006   Td 11/08/2006   Tdap 09/05/2013   Zoster Recombinat (Shingrix) 08/27/2016, 11/24/2016   Zoster, Live 12/16/2005    TDAP status: Up to date  Flu Vaccine status: Up to date  Pneumococcal vaccine status: Up to date  Covid-19 vaccine status: Completed vaccines  Qualifies for Shingles Vaccine? Yes   Zostavax completed Yes   Shingrix Completed?: Yes  Screening Tests Health Maintenance  Topic Date Due   URINE MICROALBUMIN  Never done   HEMOGLOBIN A1C  06/14/2021   FOOT EXAM  12/12/2021   OPHTHALMOLOGY EXAM  01/22/2022   TETANUS/TDAP  09/06/2023   Pneumonia Vaccine 47+ Years old  Completed   INFLUENZA VACCINE  Completed   COVID-19 Vaccine  Completed   Zoster Vaccines- Shingrix  Completed   HPV VACCINES  Aged Out    Health Maintenance  Health Maintenance Due  Topic Date Due   URINE MICROALBUMIN  Never done    Colorectal cancer screening: No longer required.   Lung Cancer Screening: (Low Dose CT Chest recommended if Age 61-80 years, 30 pack-year currently smoking OR have quit  w/in 15years.) does not qualify.   Lung Cancer Screening Referral:   Additional Screening:  Hepatitis C Screening: does not qualify  Vision Screening: Recommended annual ophthalmology exams for early detection of glaucoma and other disorders of the eye. Is the patient up to date with their annual eye exam?  Yes  Who is the provider or what is the name of the office in which the patient attends annual eye exams? Dr. Wallace Going   Dental Screening: Recommended annual dental exams for proper oral hygiene  Community Resource Referral / Chronic Care Management: CRR required this visit?  No   CCM required this visit?  No      Plan:     I have personally reviewed and noted the following in the patients chart:   Medical and social history Use of alcohol, tobacco or illicit drugs  Current medications and supplements including opioid prescriptions. Patient is not currently taking opioid prescriptions. Functional ability and status Nutritional status Physical activity Advanced directives List of other physicians Hospitalizations, surgeries, and ER visits in previous 12 months Vitals Screenings to include cognitive, depression, and falls Referrals and appointments  In addition, I have reviewed and discussed with patient certain preventive protocols, quality metrics, and best practice recommendations. A written personalized care plan for preventive services as well as general preventive health recommendations were provided to patient.    Due to this being a telephonic visit, the after visit summary with patients personalized plan was offered to patient via mail or my-chart. Patient would like to access on my-chart.    Loma Messing, LPN 09/07/8766   Nurse Health Advisor  Nurse Notes: none

## 2021-06-09 ENCOUNTER — Ambulatory Visit (INDEPENDENT_AMBULATORY_CARE_PROVIDER_SITE_OTHER): Payer: Medicare PPO

## 2021-06-09 VITALS — Ht 71.0 in | Wt 184.0 lb

## 2021-06-09 DIAGNOSIS — Z0000 Encounter for general adult medical examination without abnormal findings: Secondary | ICD-10-CM

## 2021-06-09 NOTE — Patient Instructions (Addendum)
[name] , Thank you for taking time to complete your Medicare Wellness Visit. I appreciate your ongoing commitment to your health goals. Please review the following plan we discussed and let me know if I can assist you in the future.   Screening recommendations/referrals: Colonoscopy: no longer required  Recommended yearly ophthalmology/optometry visit for glaucoma screening and checkup Recommended yearly dental visit for hygiene and checkup  Vaccinations: Influenza vaccine: up to date Pneumococcal vaccine: up to date Tdap vaccine: up to date, completed 09/05/13, Due 09/06/23 Shingles vaccine: up to date   Covid-19: up to date  Advanced directives: Please bring a copy of Living Will and/or [place] for your chart.   Conditions/risks identified: see problem list  Next appointment: Follow up in one year for your annual wellness visit. 06/10/22 @ 11:45am, this will be a telephone visit  Preventive Care 3 Years and Older, Male Preventive care refers to lifestyle choices and visits with your health care provider that can promote health and wellness. What does preventive care include? A yearly physical exam. This is also called an annual well check. Dental exams once or twice a year. Routine eye exams. Ask your health care provider how often you should have your eyes checked. Personal lifestyle choices, including: Daily care of your teeth and gums. Regular physical activity. Eating a healthy diet. Avoiding tobacco and drug use. Limiting alcohol use. Practicing safe sex. Taking low doses of aspirin every day. Taking vitamin and mineral supplements as recommended by your health care provider. What happens during an annual well check? The services and screenings done by your health care provider during your annual well check will depend on your age, overall health, lifestyle risk factors, and family history of disease. Counseling  Your health care provider may ask  you questions about your: Alcohol use. Tobacco use. Drug use. Emotional well-being. Home and relationship well-being. Sexual activity. Eating habits. History of falls. Memory and ability to understand (cognition). Work and work Statistician. Screening  You may have the following tests or measurements: Height, weight, and BMI. Blood pressure. Lipid and cholesterol levels. These may be checked every 5 years, or more frequently if you are over 84 years old. Skin check. Lung cancer screening. You may have this screening every year starting at age 52 if you have a 30-pack-year history of smoking and currently smoke or have quit within the past 15 years. Fecal occult blood test (FOBT) of the stool. You may have this test every year starting at age 20. Flexible sigmoidoscopy or colonoscopy. You may have a sigmoidoscopy every 5 years or a colonoscopy every 10 years starting at age 55. Prostate cancer screening. Recommendations will vary depending on your family history and other risks. Hepatitis C blood test. Hepatitis B blood test. Sexually transmitted disease (STD) testing. Diabetes screening. This is done by checking your blood sugar (glucose) after you have not eaten for a while (fasting). You may have this done every 1-3 years. Abdominal aortic aneurysm (AAA) screening. You may need this if you are a current or former smoker. Osteoporosis. You may be screened starting at age 68 if you are at high risk. Talk with your health care provider about your test results, treatment options, and if necessary, the need for more tests. Vaccines  Your health care provider may recommend certain vaccines, such as: Influenza vaccine. This is recommended every year. Tetanus, diphtheria, and acellular pertussis (Tdap, Td) vaccine. You may need a Td booster every 10 years. Zoster vaccine. You  may need this after age 69. Pneumococcal 13-valent conjugate (PCV13) vaccine. One dose is recommended after age  56. Pneumococcal polysaccharide (PPSV23) vaccine. One dose is recommended after age 19. Talk to your health care provider about which screenings and vaccines you need and how often you need them. This information is not intended to replace advice given to you by your health care provider. Make sure you discuss any questions you have with your health care provider. Document Released: 06/20/2015 Document Revised: 02/11/2016 Document Reviewed: 03/25/2015 Elsevier Interactive Patient Education  2017 [place] Prevention in the Home Falls can cause injuries. They can happen to people of all ages. There are many things you can do to make your home safe and to help prevent falls. What can I do on the outside of my home? Regularly fix the edges of walkways and driveways and fix any cracks. Remove anything that might make you trip as you walk through a door, such as a raised step or threshold. Trim any bushes or trees on the path to your home. Use bright outdoor lighting. Clear any walking paths of anything that might make someone trip, such as rocks or tools. Regularly check to see if handrails are loose or broken. Make sure that both sides of any steps have handrails. Any raised decks and porches should have guardrails on the edges. Have any leaves, snow, or ice cleared regularly. Use sand or salt on walking paths during winter. Clean up any spills in your garage right away. This includes oil or grease spills. What can I do in the bathroom? Use night lights. Install grab bars by the toilet and in the tub and shower. Do not use towel bars as grab bars. Use non-skid mats or decals in the tub or shower. If you need to sit down in the shower, use a plastic, non-slip stool. Keep the floor dry. Clean up any water that spills on the floor as soon as it happens. Remove soap buildup in the tub or shower regularly. Attach bath mats securely with double-sided non-slip rug tape. Do not have throw  rugs and other things on the floor that can make you trip. What can I do in the bedroom? Use night lights. Make sure that you have a light by your bed that is easy to reach. Do not use any sheets or blankets that are too big for your bed. They should not hang down onto the floor. Have a firm chair that has side arms. You can use this for support while you get dressed. Do not have throw rugs and other things on the floor that can make you trip. What can I do in the kitchen? Clean up any spills right away. Avoid walking on wet floors. Keep items that you use a lot in easy-to-reach places. If you need to reach something above you, use a strong step stool that has a grab bar. Keep electrical cords out of the way. Do not use floor polish or wax that makes floors slippery. If you must use wax, use non-skid floor wax. Do not have throw rugs and other things on the floor that can make you trip. What can I do with my stairs? Do not leave any items on the stairs. Make sure that there are handrails on both sides of the stairs and use them. Fix handrails that are broken or loose. Make sure that handrails are as long as the stairways. Check any carpeting to make sure that it is firmly  attached to the stairs. Fix any carpet that is loose or worn. Avoid having throw rugs at the top or bottom of the stairs. If you do have throw rugs, attach them to the floor with carpet tape. Make sure that you have a light switch at the top of the stairs and the bottom of the stairs. If you do not have them, ask someone to add them for you. What else can I do to help prevent falls? Wear shoes that: Do not have high heels. Have rubber bottoms. Are comfortable and fit you well. Are closed at the toe. Do not wear sandals. If you use a stepladder: Make sure that it is fully opened. Do not climb a closed stepladder. Make sure that both sides of the stepladder are locked into place. Ask someone to hold it for you, if  possible. Clearly mark and make sure that you can see: Any grab bars or handrails. First and last steps. Where the edge of each step is. Use tools that help you move around (mobility aids) if they are needed. These include: Canes. Walkers. Scooters. Crutches. Turn on the lights when you go into a dark area. Replace any light bulbs as soon as they burn out. Set up your furniture so you have a clear path. Avoid moving your furniture around. If any of your floors are uneven, fix them. If there are any pets around you, be aware of where they are. Review your medicines with your doctor. Some medicines can make you feel dizzy. This can increase your chance of falling. Ask your doctor what other things that you can do to help prevent falls. This information is not intended to replace advice given to you by your health care provider. Make sure you discuss any questions you have with your health care provider. Document Released: 03/20/2009 Document Revised: 10/30/2015 Document Reviewed: 06/28/2014 Elsevier Interactive Patient Education  2017 [employer].

## 2021-06-17 ENCOUNTER — Encounter: Payer: Self-pay | Admitting: Internal Medicine

## 2021-06-17 ENCOUNTER — Ambulatory Visit: Payer: Medicare PPO | Admitting: Internal Medicine

## 2021-06-17 ENCOUNTER — Other Ambulatory Visit: Payer: Self-pay

## 2021-06-17 VITALS — BP 122/80 | HR 75 | Temp 97.3°F | Ht 71.0 in | Wt 185.0 lb

## 2021-06-17 DIAGNOSIS — N401 Enlarged prostate with lower urinary tract symptoms: Secondary | ICD-10-CM

## 2021-06-17 DIAGNOSIS — I5020 Unspecified systolic (congestive) heart failure: Secondary | ICD-10-CM | POA: Diagnosis not present

## 2021-06-17 DIAGNOSIS — I152 Hypertension secondary to endocrine disorders: Secondary | ICD-10-CM | POA: Diagnosis not present

## 2021-06-17 DIAGNOSIS — E1159 Type 2 diabetes mellitus with other circulatory complications: Secondary | ICD-10-CM | POA: Diagnosis not present

## 2021-06-17 DIAGNOSIS — N1832 Chronic kidney disease, stage 3b: Secondary | ICD-10-CM | POA: Diagnosis not present

## 2021-06-17 LAB — POCT GLYCOSYLATED HEMOGLOBIN (HGB A1C): Hemoglobin A1C: 6.6 % — AB (ref 4.0–5.6)

## 2021-06-17 MED ORDER — TORSEMIDE 20 MG PO TABS: 20.0000 mg | ORAL_TABLET | Freq: Every day | ORAL | 0 refills | Status: DC | PRN

## 2021-06-17 NOTE — Assessment & Plan Note (Signed)
Lab Results  Component Value Date   HGBA1C 6.6 (A) 06/17/2021   Good control on jardiance 10 and metformin 500 bid

## 2021-06-17 NOTE — Progress Notes (Signed)
Subjective:    Patient ID: Dustin Harrison, male    DOB: [birth year], 88 y.o.   MRN: [id number]  HPI Here with wife for transfer of care  Not regularly checking sugars He thinks they are okay No foot numbness or burning  No chest pain Low energy---some DOE which may be slightly worse Does have to nap after hydro exercises---but he goes Weighs daily---has been stable (185# at home) Sleeps flat--no PND No edema No palpitations Hasn't been using the torsemide regularly  Urinary stream is slow Nocturia x 3-4 Continues on dual therapy  Heartburn controlled with protonix Some post prandial cough Swallows okay though  GFR 42  Current Outpatient Medications on File Prior to Visit  Medication Sig Dispense Refill   amLODipine (NORVASC) 5 MG tablet Take 1 tablet (5 mg total) by mouth in the morning and at bedtime. 180 tablet 3   aspirin 81 MG EC tablet Take 81 mg by mouth daily. Swallow whole.     atorvastatin (LIPITOR) 20 MG tablet Take 1 tablet (20 mg total) by mouth daily. 90 tablet 3   ezetimibe (ZETIA) 10 MG tablet Take 1 tablet (10 mg total) by mouth daily. 90 tablet 3   finasteride (PROSCAR) 5 MG tablet TAKE ONE TABLET EVERY DAY 90 tablet 1   JARDIANCE 10 MG TABS tablet TAKE ONE TABLET EVERY DAY BEFORE BREAKFAST 90 tablet 3   latanoprost (XALATAN) 0.005 % ophthalmic solution Place 1 drop into both eyes at bedtime.     metFORMIN (GLUCOPHAGE) 500 MG tablet TAKE ONE TABLET BY MOUTH TWICE DAILY WITH A MEAL 180 tablet 1   mirtazapine (REMERON) 7.5 MG tablet Take 1 tablet (7.5 mg total) by mouth at bedtime. 30 tablet 5   pantoprazole (PROTONIX) 20 MG tablet TAKE 1 TABLET BY MOUTH DAILY 90 tablet 3   polyethylene glycol powder (GLYCOLAX/MIRALAX) 17 GM/SCOOP powder Take 1 Container by mouth daily as needed for mild constipation or moderate constipation.     tamsulosin (FLOMAX) 0.4 MG CAPS capsule TAKE 2 CAPSULES EVERY DAY 180 capsule 0   timolol (TIMOPTIC) 0.5 % ophthalmic solution  Place 1 drop into both eyes 2 (two) times daily.     torsemide (DEMADEX) 20 MG tablet Take 1 tablet (20 mg total) by mouth daily as needed. For increased ankle swelling 90 tablet 3   losartan (COZAAR) 100 MG tablet Take 1 tablet (100 mg total) by mouth daily. 90 tablet 3   No current facility-administered medications on file prior to visit.    No Known Allergies  Past Medical History:  Diagnosis Date   Abnormal glucose    Arthritis    Baker's cyst of knee    left   BPH (benign prostatic hypertrophy)    Cough    because of "tight" esophagus   Diabetes mellitus    Glaucoma    HOH (hard of hearing)    Hypertension    Pheochromocytoma of right adrenal gland    Stroke [facility])    Ulcer    Wears hearing aid    bilateral    Past Surgical History:  Procedure Laterality Date   adrenaletomy     CARDIAC CATHETERIZATION     CATARACT EXTRACTION W/PHACO Left 10/08/2015   Procedure: CATARACT EXTRACTION PHACO AND INTRAOCULAR LENS PLACEMENT ([place]) left eye;  Surgeon: Leandrew Koyanagi, MD;  Location: [place];  Service: Ophthalmology;  Laterality: Left;  MALYUGIN   HERNIA REPAIR     RIGHT/LEFT HEART CATH AND CORONARY ANGIOGRAPHY Left 11/28/2019  Procedure: RIGHT/LEFT HEART CATH AND CORONARY ANGIOGRAPHY;  Surgeon: Nelva Bush, MD;  Location: [place] CV [facility];  Service: Cardiovascular;  Laterality: Left;   SQUAMOUS CELL CARCINOMA EXCISION  03-2006   left ear   TEE WITHOUT CARDIOVERSION N/A 10/12/2019   Procedure: TRANSESOPHAGEAL ECHOCARDIOGRAM (TEE);  Surgeon: Minna Merritts, MD;  Location: [facility];  Service: Cardiovascular;  Laterality: N/A;   TONSILLECTOMY     XI ROBOTIC LAPAROSCOPIC ASSISTED APPENDECTOMY N/A 10/11/2019   Procedure: XI ROBOTIC ASSISTED LAPAROSCOPIC INSERTION GASTROSTOMY TUBE;  Surgeon: Jules Husbands, MD;  Location: [facility];  Service: General;  Laterality: N/A;    Family History  Problem Relation Age of Onset   Alcohol abuse Mother    Coronary artery  disease Mother    Brain cancer Father        tumor   Diabetes Brother     Social History   Socioeconomic History   Marital status: Married    Spouse name: Not on file   Number of children: Not on file   Years of education: Not on file   Highest education level: Not on file  Occupational History   Occupation: chaplain  Tobacco Use   Smoking status: Never   Smokeless tobacco: Never  Vaping Use   Vaping Use: Never used  Substance and Sexual Activity   Alcohol use: Not Currently    Alcohol/week: 1.0 standard drink    Types: 1 Glasses of wine per week    Comment: 1 glass per week   Drug use: No   Sexual activity: Not Currently  Other Topics Concern   Not on file  Social History Narrative   Regular exercise- yes- 3-4 times a week treadmill/walk   Diet: fruits and veggies,water   Living will, HCPOA (wife) , full code (reviewed 64)   Social Determinants of Health   Financial Resource Strain: Low Risk    Difficulty of Paying Living Expenses: Not hard at all  Food Insecurity: No Food Insecurity   Worried About Charity fundraiser in the Last Year: Never true   [place] in the Last Year: Never true  Transportation Needs: No Transportation Needs   Lack of Transportation (Medical): No   Lack of Transportation (Non-Medical): No  Physical Activity: Insufficiently Active   Days of Exercise per Week: 2 days   Minutes of Exercise per Session: 60 min  Stress: No Stress Concern Present   Feeling of Stress : Not at all  Social Connections: Socially Integrated   Frequency of Communication with Friends and Family: More than three times a week   Frequency of Social Gatherings with Friends and Family: Three times a week   Attends Religious Services: More than 4 times per year   Active Member of Clubs or Organizations: Yes   Attends Music therapist: More than 4 times per year   Marital Status: Married  Human resources officer Violence: Not At Risk   Fear of Current or  Ex-Partner: No   Emotionally Abused: No   Physically Abused: No   Sexually Abused: No   Review of Systems No anxiety or depression Sleeps well--other than the nocturia (uses melatonin 1mg ) Appetite is good Does have some constipation--used an enema (metamucil doesn't help). Miralax works but discussed using it every day    Objective:   Physical Exam Constitutional:      Appearance: Normal appearance.  Cardiovascular:     Rate and Rhythm: Normal rate and regular rhythm.  Pulses: Normal pulses.     Heart sounds:    No gallop.     Comments: Soft systolic murmur Pulmonary:     Effort: Pulmonary effort is normal.     Breath sounds: Normal breath sounds. No wheezing or rales.  Abdominal:     Palpations: Abdomen is soft.     Tenderness: There is no abdominal tenderness.  Musculoskeletal:     Cervical back: Neck supple.     Comments: Trace ankle/foot edema  Lymphadenopathy:     Cervical: No cervical adenopathy.  Skin:    Findings: No rash.     Comments: No foot lesions  Neurological:     Mental Status: He is alert.  Psychiatric:        Mood and Affect: Mood normal.        Behavior: Behavior normal.           Assessment & Plan:

## 2021-06-17 NOTE — Assessment & Plan Note (Signed)
Compensated with losartan 100mg  daily, jardiance 10 On ASA 81 Will have torsemide 20mg  be daily prn home weight over 185#

## 2021-06-17 NOTE — Assessment & Plan Note (Signed)
Still with nocturia despite dual therapy with tamsulosin 0.4 and finasteride 5

## 2021-06-17 NOTE — Assessment & Plan Note (Signed)
BP Readings from Last 3 Encounters:  06/17/21 122/80  02/02/21 130/62  12/12/20 132/84   Controlled with losartan 100, amlodipine 5 bid

## 2021-06-17 NOTE — Assessment & Plan Note (Signed)
Is on the losartan Will recheck next time

## 2021-06-19 ENCOUNTER — Other Ambulatory Visit: Payer: Self-pay | Admitting: Internal Medicine

## 2021-07-24 DIAGNOSIS — H401132 Primary open-angle glaucoma, bilateral, moderate stage: Secondary | ICD-10-CM | POA: Diagnosis not present

## 2021-08-24 ENCOUNTER — Other Ambulatory Visit: Payer: Self-pay | Admitting: Internal Medicine

## 2021-08-24 ENCOUNTER — Other Ambulatory Visit: Payer: Self-pay | Admitting: Family Medicine

## 2021-08-28 DIAGNOSIS — H401132 Primary open-angle glaucoma, bilateral, moderate stage: Secondary | ICD-10-CM | POA: Diagnosis not present

## 2021-09-22 DIAGNOSIS — R600 Localized edema: Secondary | ICD-10-CM | POA: Diagnosis not present

## 2021-09-22 DIAGNOSIS — F325 Major depressive disorder, single episode, in full remission: Secondary | ICD-10-CM | POA: Diagnosis not present

## 2021-09-22 DIAGNOSIS — N400 Enlarged prostate without lower urinary tract symptoms: Secondary | ICD-10-CM | POA: Diagnosis not present

## 2021-09-22 DIAGNOSIS — H409 Unspecified glaucoma: Secondary | ICD-10-CM | POA: Diagnosis not present

## 2021-09-22 DIAGNOSIS — E785 Hyperlipidemia, unspecified: Secondary | ICD-10-CM | POA: Diagnosis not present

## 2021-09-22 DIAGNOSIS — K219 Gastro-esophageal reflux disease without esophagitis: Secondary | ICD-10-CM | POA: Diagnosis not present

## 2021-09-22 DIAGNOSIS — I10 Essential (primary) hypertension: Secondary | ICD-10-CM | POA: Diagnosis not present

## 2021-09-22 DIAGNOSIS — E119 Type 2 diabetes mellitus without complications: Secondary | ICD-10-CM | POA: Diagnosis not present

## 2021-09-22 DIAGNOSIS — R32 Unspecified urinary incontinence: Secondary | ICD-10-CM | POA: Diagnosis not present

## 2021-10-29 DIAGNOSIS — H903 Sensorineural hearing loss, bilateral: Secondary | ICD-10-CM | POA: Diagnosis not present

## 2021-10-29 DIAGNOSIS — Z9621 Cochlear implant status: Secondary | ICD-10-CM | POA: Diagnosis not present

## 2021-10-29 DIAGNOSIS — Z45321 Encounter for adjustment and management of cochlear device: Secondary | ICD-10-CM | POA: Diagnosis not present

## 2021-11-09 ENCOUNTER — Telehealth: Payer: Self-pay | Admitting: Internal Medicine

## 2021-11-09 ENCOUNTER — Ambulatory Visit: Payer: Medicare PPO | Admitting: Family Medicine

## 2021-11-09 ENCOUNTER — Other Ambulatory Visit: Payer: Medicare PPO

## 2021-11-09 ENCOUNTER — Encounter: Payer: Self-pay | Admitting: Family Medicine

## 2021-11-09 VITALS — BP 140/70 | HR 79 | Temp 98.7°F | Ht 71.0 in | Wt 183.5 lb

## 2021-11-09 DIAGNOSIS — E785 Hyperlipidemia, unspecified: Secondary | ICD-10-CM

## 2021-11-09 DIAGNOSIS — Z9621 Cochlear implant status: Secondary | ICD-10-CM | POA: Diagnosis not present

## 2021-11-09 DIAGNOSIS — Z8673 Personal history of transient ischemic attack (TIA), and cerebral infarction without residual deficits: Secondary | ICD-10-CM | POA: Diagnosis not present

## 2021-11-09 DIAGNOSIS — H5711 Ocular pain, right eye: Secondary | ICD-10-CM

## 2021-11-09 DIAGNOSIS — I5020 Unspecified systolic (congestive) heart failure: Secondary | ICD-10-CM | POA: Diagnosis not present

## 2021-11-09 DIAGNOSIS — H02401 Unspecified ptosis of right eyelid: Secondary | ICD-10-CM | POA: Diagnosis not present

## 2021-11-09 DIAGNOSIS — I152 Hypertension secondary to endocrine disorders: Secondary | ICD-10-CM

## 2021-11-09 DIAGNOSIS — E1169 Type 2 diabetes mellitus with other specified complication: Secondary | ICD-10-CM | POA: Diagnosis not present

## 2021-11-09 DIAGNOSIS — E1159 Type 2 diabetes mellitus with other circulatory complications: Secondary | ICD-10-CM | POA: Diagnosis not present

## 2021-11-09 DIAGNOSIS — R519 Headache, unspecified: Secondary | ICD-10-CM | POA: Diagnosis not present

## 2021-11-09 LAB — SEDIMENTATION RATE: Sed Rate: 7 mm/hr (ref 0–20)

## 2021-11-09 NOTE — Patient Instructions (Signed)
Expect a call about getting a CT of your brain - ideally today.  Go to the lab to get 2 specific tests.

## 2021-11-09 NOTE — Telephone Encounter (Addendum)
Please see note below access nurse note; have spoken with pt and his wife and pt has appt to see Dr Lorelei Pont 11/09/21 at 11:20. Sending note to Dr Lorelei Pont and Butch Penny CMA.   [place] [facility] - Client TELEPHONE ADVICE RECORD AccessNurse Patient Name: ABRON NEDDO [facility] Gender: Male DOB: 1932/04/04 Age: 88 Y 31 M 4 D Return Phone Number: [phone] (Primary), [phone] (Secondary) Address: City/ State/ Zip: [place]  [address] Client [facility] - Client Client Site [place] - Day Provider Viviana Simpler- MD Contact Type Call Who Is Calling Patient / Member / Family / Caregiver Call Type Triage / Clinical Relationship To Patient Self Return Phone Number [phone] (Primary) Chief Complaint Headache Reason for Call Symptomatic / Request for [place] states that he has a headache and eye pain. Translation No Nurse Assessment Nurse: Windle Guard, RN, Lesa Date/Time (Eastern Time): 11/09/2021 8:41:47 AM Confirm and document reason for call. If symptomatic, describe symptoms. ---Caller states her husband has a headache that radiates to his eye with pain Does the patient have any new or worsening symptoms? ---Yes Will a triage be completed? ---Yes Related visit to physician within the last 2 weeks? ---No Does the PT have any chronic conditions? (i.e. diabetes, asthma, this includes High risk factors for pregnancy, etc.) ---Yes List chronic conditions. ---hx of stroke, Is this a behavioral health or substance abuse call? ---No Guidelines Guideline Title Affirmed Question Affirmed Notes Nurse Date/Time (Eastern Time) Headache Severe pain in one eye Conner, RN, Lesa 11/09/2021 8:43:20 AM Disp. Time Eilene Ghazi Time) Disposition Final User 11/09/2021 8:38:42 AM Attempt made - no message left Conner, RN, Lesa 11/09/2021 8:51:38 AM Go to ED Now Yes Conner, RN, Lesa Caller  Disagree/Comply Disagree PLEASE NOTE: All timestamps contained within this report are represented as [place] Standard Time. CONFIDENTIALTY NOTICE: This fax transmission is intended only for the addressee. It contains information that is legally privileged, confidential or otherwise protected from use or disclosure. If you are not the intended recipient, you are strictly prohibited from reviewing, disclosing, copying using or disseminating any of this information or taking any action in reliance on or regarding this information. If you have received this fax in error, please notify us immediately by telephone so that we can arrange for its return to [place]. Phone: [phone], Toll-Free: [phone], Fax: [phone] Page: 2 of 2 Call Id: [id number] [place] Understands Yes PreDisposition Did not know what to do Care Advice Given Per Guideline GO TO ED NOW: * You need to be seen in the Emergency Department. * Go to the ED at ___________ [place] now. Drive carefully. ANOTHER ADULT SHOULD DRIVE: * It is better and safer if another adult drives instead of you. CARE ADVICE given per Headache (Adult) guideline. Comments User: Starleen Blue, RN Date/Time Eilene Ghazi Time): 11/09/2021 8:51:36 AM The office states they will call the patient back shortly User: Starleen Blue, RN Date/Time Eilene Ghazi Time): 11/09/2021 8:52:12 AM Verbalizes understanding Referrals GO TO FACILITY REFUSE

## 2021-11-09 NOTE — Progress Notes (Signed)
Dustin Patient T. Azjah Pardo, MD, [place] at [facility] [place], [address]  Phone: [phone]  FAX: [phone]  Dustin Harrison - 88 y.o. male  MRN [id number]  Date of Birth: 09-16-1931  Date: 11/09/2021  PCP: [name], MD  Referral: [name], MD  Chief Complaint  Patient presents with  . Headache    X 2 weeks   Subjective:   Dustin Harrison is a 88 y.o. very pleasant male patient with Body mass index is 25.59 kg/m. who presents with the following:  Very nice gentleman who is a patient of Dr. Alla German and lives at [facility] who has a history of congestive heart failure, history of CVA, history of diabetes, as well as hypertension, hyperlipidemia.  He also has a history of a cochlear implant.  He presents with 2 weeks of pain on the right eyelid with some notable drooping on the right side compared to the left.  He has not noticed any real visual changes compared to his baseline.  On check in the office today his vision is 20/30.  He also has a right-sided headache and is now moving into his eye.  No blurred vision.  No double vision.  Additional history was provided by his wife.  They appreciate no memory changes.  Does have some disturbed balance at baseline at age 88, but there is been no changes that are new or different.  He also has a headache that is more on the right side.  No focal weakness, numbness. No other acute illness symptoms.  No fever, chills, myalgias.  Review of Systems is noted in the HPI, as appropriate  Patient Active Problem List   Diagnosis Date Noted  . History of CVA (cerebrovascular accident) 06/13/2020  . Vocal cord paralysis 12/27/2019  . HFrEF (heart failure with reduced ejection fraction) ([place])   . Chronic back pain 10/07/2019  . Stage 3b chronic kidney disease ([place]) 04/23/2016  . Hearing loss 04/23/2016  . Hypertension associated with diabetes ([place])  08/22/2012  . Diabetes mellitus with cardiac complication ([place]) 55/73/2202  . Hyperlipidemia associated with type 2 diabetes mellitus ([place]) 05/12/2009  . History of pheochromocytoma 09/07/2007  . GLAUCOMA 09/07/2007  . History of duodenal ulcer 09/07/2007  . BPH (benign prostatic hyperplasia) 09/07/2007  . OSTEOARTHRITIS 09/07/2007    Past Medical History:  Diagnosis Date  . Abnormal glucose   . Arthritis   . Baker's cyst of knee    left  . BPH (benign prostatic hypertrophy)   . Cough    because of "tight" esophagus  . Diabetes mellitus   . Glaucoma   . HOH (hard of hearing)   . Hypertension   . Pheochromocytoma of right adrenal gland   . Stroke ([place])   . Ulcer   . Wears hearing aid    bilateral    Past Surgical History:  Procedure Laterality Date  . adrenaletomy    . CARDIAC CATHETERIZATION    . CATARACT EXTRACTION W/PHACO Left 10/08/2015   Procedure: CATARACT EXTRACTION PHACO AND INTRAOCULAR LENS PLACEMENT ([place]) left eye;  Surgeon: Leandrew Koyanagi, MD;  Location: [place];  Service: Ophthalmology;  Laterality: Left;  MALYUGIN  . HERNIA REPAIR    . RIGHT/LEFT HEART CATH AND CORONARY ANGIOGRAPHY Left 11/28/2019   Procedure: RIGHT/LEFT HEART CATH AND CORONARY ANGIOGRAPHY;  Surgeon: Nelva Bush, MD;  Location: [place] [facility];  Service: Cardiovascular;  Laterality: Left;  . SQUAMOUS  CELL CARCINOMA EXCISION  03-2006   left ear  . TEE WITHOUT CARDIOVERSION N/A 10/12/2019   Procedure: TRANSESOPHAGEAL ECHOCARDIOGRAM (TEE);  Surgeon: Minna Merritts, MD;  Location: [facility];  Service: Cardiovascular;  Laterality: N/A;  . TONSILLECTOMY    . XI ROBOTIC LAPAROSCOPIC ASSISTED APPENDECTOMY N/A 10/11/2019   Procedure: XI ROBOTIC ASSISTED LAPAROSCOPIC INSERTION GASTROSTOMY TUBE;  Surgeon: Jules Husbands, MD;  Location: [facility];  Service: General;  Laterality: N/A;    Family History  Problem Relation Age of Onset  . Alcohol abuse Mother   . Coronary artery  disease Mother   . Brain cancer Father        tumor  . Diabetes Brother     Social History   Social History Narrative   Regular exercise- yes- 3-4 times a week treadmill/walk   Diet: fruits and veggies,water   Living will, HCPOA (wife) , full code (reviewed 2015)     Objective:   BP 140/70   Pulse 79   Temp 98.7 F (37.1 C) (Oral)   Ht '5\' 11"'$  (1.803 m)   Wt 183 lb 8 oz (83.2 kg)   SpO2 96%   BMI 25.59 kg/m   GEN: No acute distress; alert,appropriate. PULM: Breathing comfortably in no respiratory distress PSYCH: Normally interactive.  CV: RRR, no m/g/r  PULM: Normal respiratory rate, no accessory muscle use. No wheezes, crackles or rhonchi   His eyelid on the right side is noticeably droopier compared to the left.  Neuro: CN 2-12 grossly intact. PERRLA. EOMI. Sensation intact throughout. Str 5/5 all extremities. No clonus.  Additional balance changes not assessed due to risk at age 88 and baseline. No nystagmus.  PSYCH: Normally interactive. Conversant. Not depressed or anxious appearing.  Calm demeanor.    Laboratory and Imaging Data:  Assessment and Plan:     ICD-10-CM   1. Acute right eye pain  H57.11 Sedimentation rate    High sensitivity CRP    CT HEAD WO CONTRAST (5MM)    2. Drooping eyelid disease, right  H02.401 Sedimentation rate    High sensitivity CRP    CT HEAD WO CONTRAST (5MM)    3. Acute intractable headache, unspecified headache type  R51.9     4. History of stroke  Z86.73     5. Systolic congestive heart failure, unspecified HF chronicity (HCC)  I50.20     6. Hyperlipidemia associated with type 2 diabetes mellitus (HCC)  E11.69    E78.5     7. Hypertension associated with diabetes ([place])  E11.59    I15.2     8. Cochlear implant in place  Z96.21      Acute headache, right-sided eye pain that has become progressive.  Multiple etiologies are present.  High risk patient with a history of stroke, congestive heart failure, hyperlipidemia,  diabetes, hypertension.  He also has a cochlear implant, so I am going avoid MRI right now.  Obtain a CT of the head without contrast to evaluate for acute stroke and acute intracranial hemorrhage.  Stat sed rate and CRP, evaluate for temporal arteritis.  Patient already has an ophthalmology appointment tomorrow that he set up himself with his personal ophthalmologist.  Additional plan of care will depend upon study findings.  Potentially acute illness/injury that poses a threat to bodily function. I did discuss this case with 2 of my partners independently face-to-face to help with plan of care.  Medication Management during today's office visit: No orders of the defined types were placed  in this encounter.  There are no discontinued medications.  Orders placed today for conditions managed today: Orders Placed This Encounter  Procedures  . CT HEAD WO CONTRAST (5MM)  . Sedimentation rate  . High sensitivity CRP    Follow-up if needed: No follow-ups on file.  Dragon [employer] One speech-to-text software was used for transcription in this dictation.  Possible transcriptional errors can occur using Editor, commissioning.   Signed,  Maud Deed. Tahje Borawski, MD   Outpatient Encounter Medications as of 11/09/2021  Medication Sig  . amLODipine (NORVASC) 5 MG tablet Take 1 tablet (5 mg total) by mouth in the morning and at bedtime.  Marland Kitchen aspirin 81 MG EC tablet Take 81 mg by mouth daily. Swallow whole.  Marland Kitchen atorvastatin (LIPITOR) 20 MG tablet Take 1 tablet (20 mg total) by mouth daily.  Marland Kitchen ezetimibe (ZETIA) 10 MG tablet Take 1 tablet (10 mg total) by mouth daily.  . finasteride (PROSCAR) 5 MG tablet TAKE ONE TABLET EVERY DAY  . JARDIANCE 10 MG TABS tablet TAKE ONE TABLET (10 MG) BY MOUTH EVERY DAY BEFORE BREAKFAST  . latanoprost (XALATAN) 0.005 % ophthalmic solution Place 1 drop into both eyes at bedtime.  . meloxicam (MOBIC) 7.5 MG tablet TAKE ONE TABLET BY MOUTH EVERY MORNING AND AT BEDTIME  .  metFORMIN (GLUCOPHAGE) 500 MG tablet TAKE ONE TABLET BY MOUTH TWICE DAILY WITH A MEAL  . pantoprazole (PROTONIX) 20 MG tablet TAKE 1 TABLET BY MOUTH DAILY  . polyethylene glycol (MIRALAX / GLYCOLAX) 17 g packet Take 17 g by mouth daily.  . polyethylene glycol powder (GLYCOLAX/MIRALAX) 17 GM/SCOOP powder Take 1 Container by mouth daily.  . tamsulosin (FLOMAX) 0.4 MG CAPS capsule TAKE 2 CAPSULES EVERY DAY  . timolol (TIMOPTIC) 0.5 % ophthalmic solution Place 1 drop into both eyes 2 (two) times daily.  Marland Kitchen torsemide (DEMADEX) 20 MG tablet Take 1 tablet (20 mg total) by mouth daily as needed. For home weight over 185#  . losartan (COZAAR) 100 MG tablet Take 1 tablet (100 mg total) by mouth daily.   No facility-administered encounter medications on file as of 11/09/2021.

## 2021-11-09 NOTE — Telephone Encounter (Signed)
I spoke with pt's wife (DPR signed) pt has had H/A at rt forehead that radiates to rt eye consistently for 2 wks. Pain level now is 2. Lightheaded and wobbly on and off(not now). No vision changes. No weakness in arms or legs. BP now is 130/59 P 69. access nurse has advised pt to go to ED but pt refuses and wants appt at [facility]. Dr Silvio Pate is out of office and pt scheduled appt with Dr Lorelei Pont 11/09/21 at 11:20. Sending note to Dr Lorelei Pont and Butch Penny CMA.

## 2021-11-09 NOTE — Telephone Encounter (Signed)
Received a call from Access nurse  Patient c/o severe headache radiating to eye  Outcome recommended: ED  Patient refused, wants to be seen in the office today  Informed RN that someone would call him back

## 2021-11-10 ENCOUNTER — Ambulatory Visit
Admission: RE | Admit: 2021-11-10 | Discharge: 2021-11-10 | Disposition: A | Discharge status: Home / Self Care | Payer: Medicare PPO | Source: Ambulatory Visit | Attending: Family Medicine | Admitting: Family Medicine

## 2021-11-10 DIAGNOSIS — H5711 Ocular pain, right eye: Secondary | ICD-10-CM | POA: Diagnosis not present

## 2021-11-10 DIAGNOSIS — R519 Headache, unspecified: Secondary | ICD-10-CM | POA: Diagnosis not present

## 2021-11-10 DIAGNOSIS — H02401 Unspecified ptosis of right eyelid: Secondary | ICD-10-CM

## 2021-11-10 LAB — HIGH SENSITIVITY CRP: hs-CRP: <0.3 mg/L

## 2021-11-10 IMAGING — CT CT HEAD W/O CM
2 series · 15 of 30 positions shown, 17 images · non-contrast
Comparison: MRI [DATE].

CLINICAL DATA: Headache, new or worsening (Age >= 50y) eval for
acute cva



[Series 2: head bone · axial · 0.47mm/px · z∈[-219,-79]mm · 8 of 88 slices shown]
[im 9/88  bone]
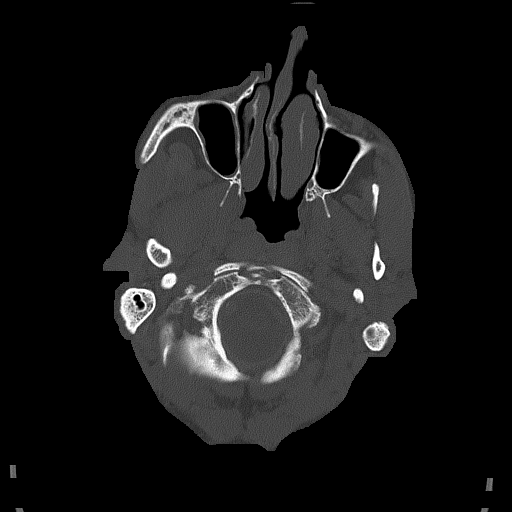
[im 18/88  bone]
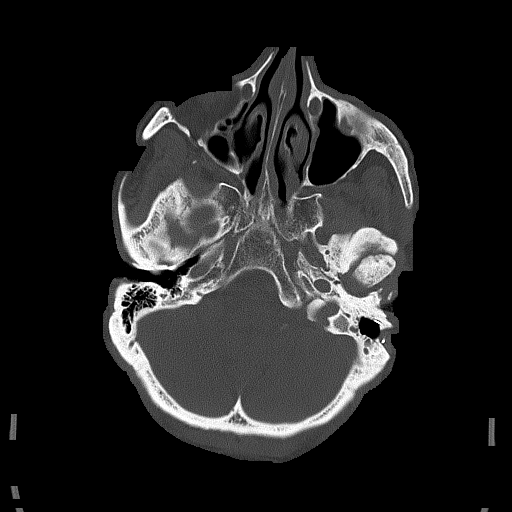
[im 27/88  bone]
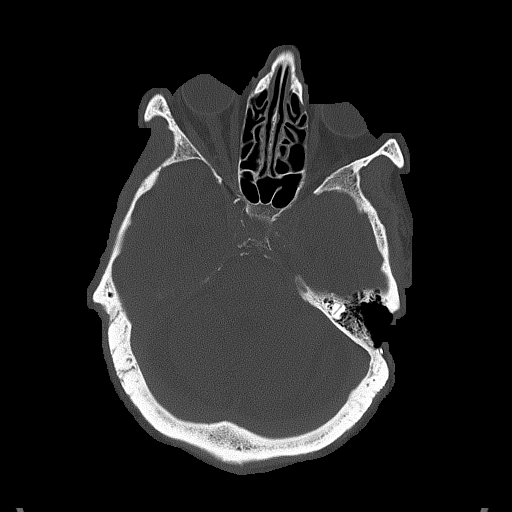
[im 40/88  bone]
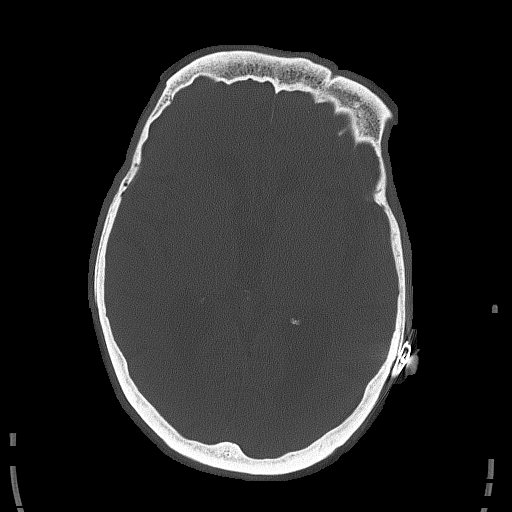
[im 48/88  bone]
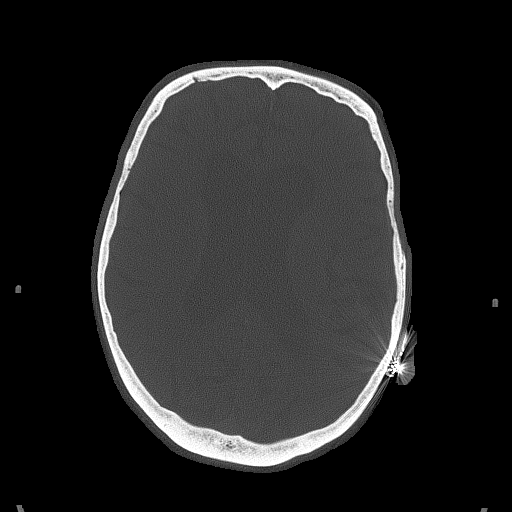
[im 61/88  bone]
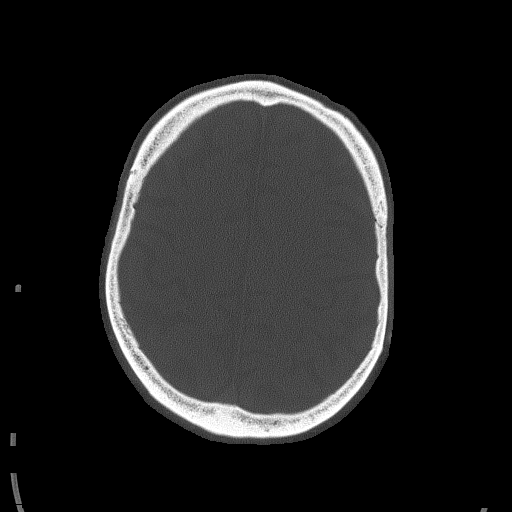
[im 70/88  bone]
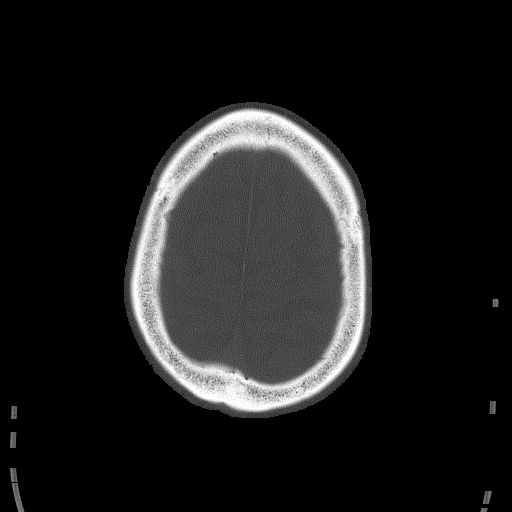
[im 79/88  bone]
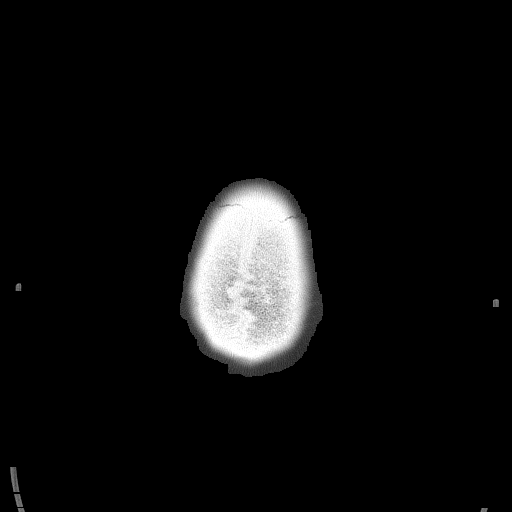

[Series 5: head without · axial · non-contrast · 0.47mm/px · z∈[-215,-90]mm · 7 of 35 slices shown, 9 images]
[im 5/35  brain]
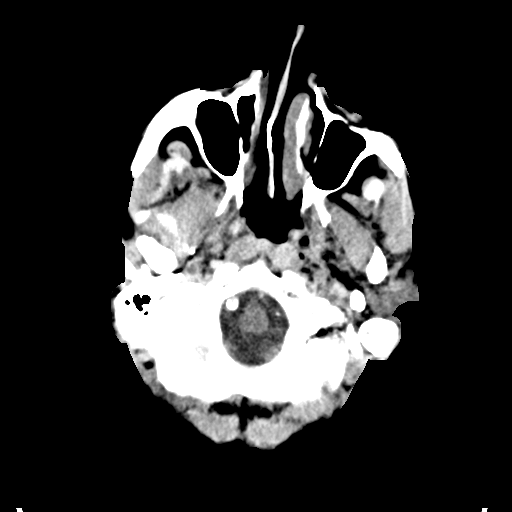
[im 5/35  bone]
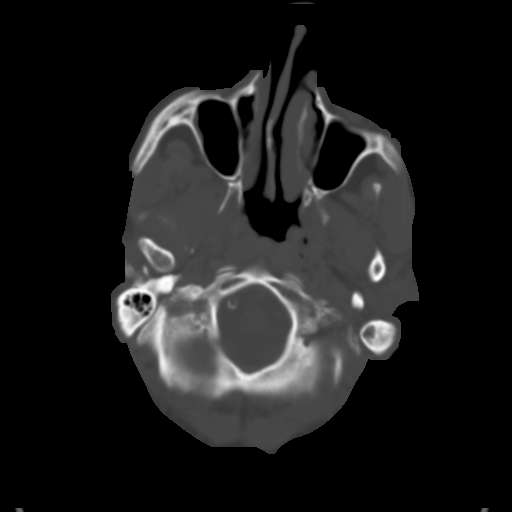
[im 9/35  brain]
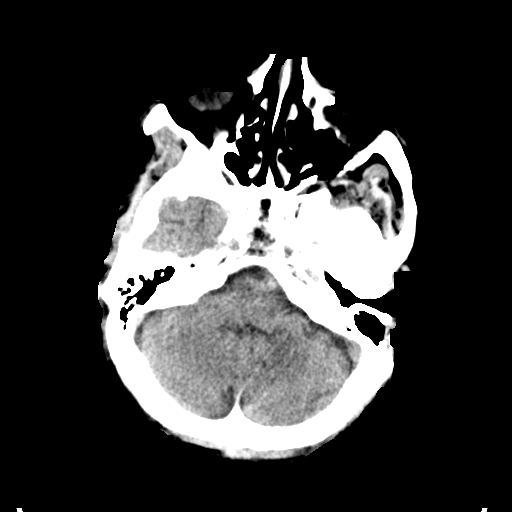
[im 13/35  brain]
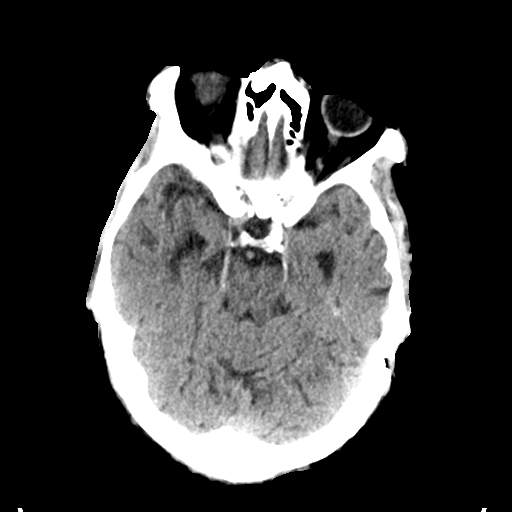
[im 18/35  brain]
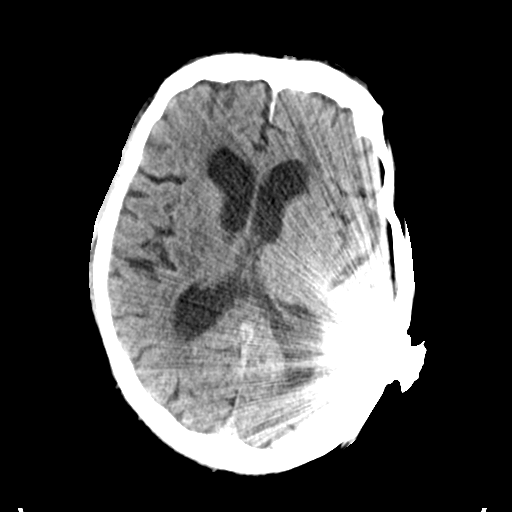
[im 22/35  brain]
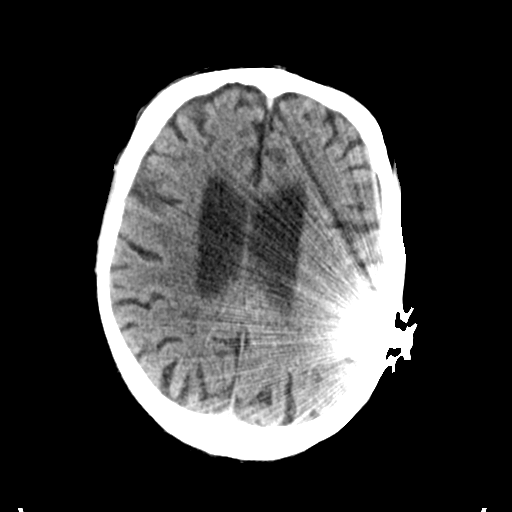
[im 22/35  bone]
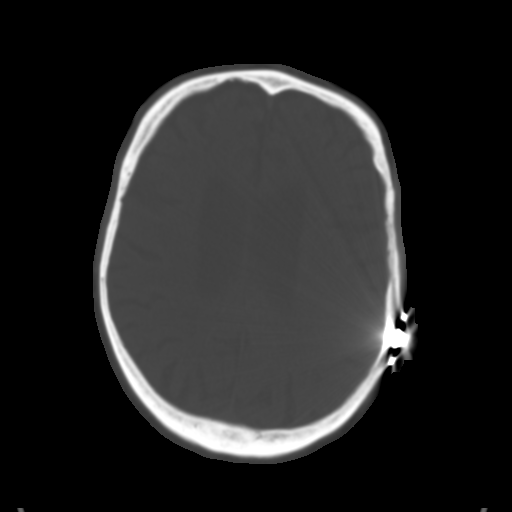
[im 26/35  brain]
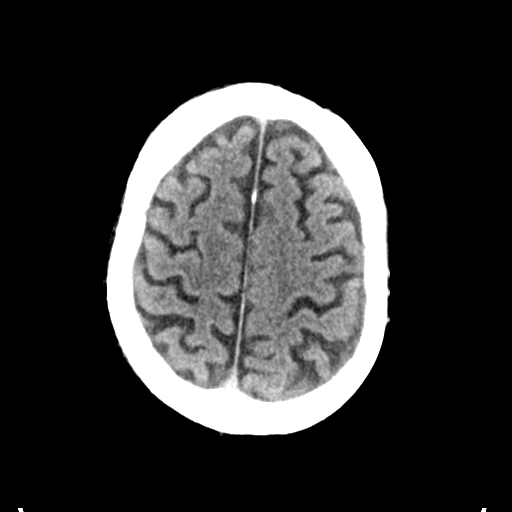
[im 30/35  brain]
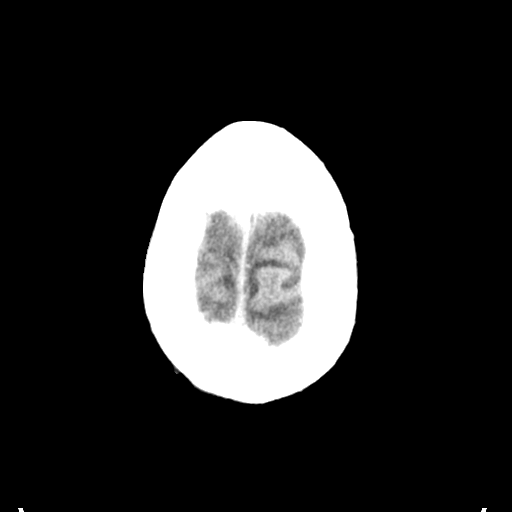

[15 of 30 positions shown; findings below may reference images not displayed]

FINDINGS: Brain: Streak artifact from left cochlear implant limits assessment.
No evidence of acute large vascular territory infarct, acute
hemorrhage, mass lesion, midline shift, or mass lesion. Cerebral
atrophy with ex vacuo ventricular dilation. No hydrocephalus.

Vascular: No hyperdense vessel identified. Calcific intracranial
atherosclerosis.

Skull: No acute fracture.

Sinuses/Orbits: Largely clear sinuses.  No acute orbital findings.

Other: Left cochlear implant and mastoidectomy.
IMPRESSION: 1. No evidence of acute intracranial abnormality.
2. Left cochlear implant and mastoidectomy with associated streak
artifact limiting assessment.

## 2021-11-23 ENCOUNTER — Other Ambulatory Visit: Payer: Self-pay | Admitting: Cardiovascular Disease

## 2021-12-02 DIAGNOSIS — D2262 Melanocytic nevi of left upper limb, including shoulder: Secondary | ICD-10-CM | POA: Diagnosis not present

## 2021-12-02 DIAGNOSIS — D2261 Melanocytic nevi of right upper limb, including shoulder: Secondary | ICD-10-CM | POA: Diagnosis not present

## 2021-12-02 DIAGNOSIS — C4441 Basal cell carcinoma of skin of scalp and neck: Secondary | ICD-10-CM | POA: Diagnosis not present

## 2021-12-02 DIAGNOSIS — L570 Actinic keratosis: Secondary | ICD-10-CM | POA: Diagnosis not present

## 2021-12-02 DIAGNOSIS — D225 Melanocytic nevi of trunk: Secondary | ICD-10-CM | POA: Diagnosis not present

## 2021-12-02 DIAGNOSIS — Z85828 Personal history of other malignant neoplasm of skin: Secondary | ICD-10-CM | POA: Diagnosis not present

## 2021-12-02 DIAGNOSIS — D485 Neoplasm of uncertain behavior of skin: Secondary | ICD-10-CM | POA: Diagnosis not present

## 2021-12-18 DIAGNOSIS — B0230 Zoster ocular disease, unspecified: Secondary | ICD-10-CM | POA: Diagnosis not present

## 2021-12-22 ENCOUNTER — Ambulatory Visit: Payer: Medicare PPO | Admitting: Internal Medicine

## 2021-12-22 ENCOUNTER — Encounter: Payer: Self-pay | Admitting: Internal Medicine

## 2021-12-22 VITALS — BP 130/80 | HR 58 | Temp 97.5°F | Ht 71.0 in | Wt 186.0 lb

## 2021-12-22 DIAGNOSIS — I5020 Unspecified systolic (congestive) heart failure: Secondary | ICD-10-CM

## 2021-12-22 DIAGNOSIS — E119 Type 2 diabetes mellitus without complications: Secondary | ICD-10-CM

## 2021-12-22 DIAGNOSIS — E1159 Type 2 diabetes mellitus with other circulatory complications: Secondary | ICD-10-CM | POA: Diagnosis not present

## 2021-12-22 DIAGNOSIS — N401 Enlarged prostate with lower urinary tract symptoms: Secondary | ICD-10-CM | POA: Diagnosis not present

## 2021-12-22 DIAGNOSIS — N1832 Chronic kidney disease, stage 3b: Secondary | ICD-10-CM | POA: Diagnosis not present

## 2021-12-22 LAB — MICROALBUMIN / CREATININE URINE RATIO
Creatinine,U: 57 mg/dL
Microalb Creat Ratio: 12.3 mg/g (ref 0.0–30.0)
Microalb, Ur: 7 mg/dL — ABNORMAL HIGH (ref 0.0–1.9)

## 2021-12-22 LAB — RENAL FUNCTION PANEL
Albumin: 4.7 g/dL (ref 3.5–5.2)
BUN: 43 mg/dL — ABNORMAL HIGH (ref 6–23)
CO2: 25 mEq/L (ref 19–32)
Calcium: 9.8 mg/dL (ref 8.4–10.5)
Chloride: 102 mEq/L (ref 96–112)
Creatinine, Ser: 1.42 mg/dL (ref 0.40–1.50)
GFR: 44.29 mL/min — ABNORMAL LOW (ref >60.00)
Glucose, Bld: 152 mg/dL — ABNORMAL HIGH (ref 70–99)
Phosphorus: 4.6 mg/dL (ref 2.3–4.6)
Potassium: 4.6 mEq/L (ref 3.5–5.1)
Sodium: 137 mEq/L (ref 135–145)

## 2021-12-22 LAB — VITAMIN D 25 HYDROXY (VIT D DEFICIENCY, FRACTURES): VITD: 41.47 ng/mL (ref 30.00–100.00)

## 2021-12-22 LAB — CBC
HCT: 41.6 % (ref 39.0–52.0)
Hemoglobin: 13.8 g/dL (ref 13.0–17.0)
MCHC: 33.1 g/dL (ref 30.0–36.0)
MCV: 95.4 fl (ref 78.0–100.0)
Platelets: 285 10*3/uL (ref 150.0–400.0)
RBC: 4.36 Mil/uL (ref 4.22–5.81)
RDW: 13.6 % (ref 11.5–15.5)
WBC: 6.5 10*3/uL (ref 4.0–10.5)

## 2021-12-22 LAB — LIPID PANEL
Cholesterol: 139 mg/dL (ref 0–200)
HDL: 80.4 mg/dL (ref >39.00)
LDL Cholesterol: 44 mg/dL (ref 0–99)
NonHDL: 58.37
Total CHOL/HDL Ratio: 2
Triglycerides: 70 mg/dL (ref 0.0–149.0)
VLDL: 14 mg/dL (ref 0.0–40.0)

## 2021-12-22 LAB — HEPATIC FUNCTION PANEL
ALT: 35 U/L (ref 0–53)
AST: 30 U/L (ref 0–37)
Albumin: 4.7 g/dL (ref 3.5–5.2)
Alkaline Phosphatase: 72 U/L (ref 39–117)
Bilirubin, Direct: 0.1 mg/dL (ref 0.0–0.3)
Total Bilirubin: 0.6 mg/dL (ref 0.2–1.2)
Total Protein: 7 g/dL (ref 6.0–8.3)

## 2021-12-22 LAB — POCT GLYCOSYLATED HEMOGLOBIN (HGB A1C): Hemoglobin A1C: 6.7 % — AB (ref 4.0–5.6)

## 2021-12-22 NOTE — Assessment & Plan Note (Signed)
Voids fine on tamsulosin daily

## 2021-12-22 NOTE — Assessment & Plan Note (Signed)
Will recheck labs on losartan 100

## 2021-12-22 NOTE — Progress Notes (Signed)
Subjective:    Patient ID: Dustin Harrison, male    DOB: 04-27-1932, 88 y.o.   MRN: [id number]  HPI Here with wife for follow up of diabetes and other chronic health conditions  Eye pain did go away Saw ophthalmologist and no worrisome findings Thought it might be shingles--but no rash  Wife worried about his feet--she notes some swelling Has torsemide for prn use--but hasn't needed No pain No SOB No chest pain No palpitations No dizziness or syncope--but has to get up slowly to prevent dizziness  Checks sugars once or twice a month Usually under 150 No low sugar reactions No foot numbness or burning  Last GFR 42  Current Outpatient Medications on File Prior to Visit  Medication Sig Dispense Refill   amLODipine (NORVASC) 5 MG tablet Take 1 tablet (5 mg total) by mouth in the morning and at bedtime. 180 tablet 3   atorvastatin (LIPITOR) 20 MG tablet Take 1 tablet (20 mg total) by mouth daily. 90 tablet 3   ezetimibe (ZETIA) 10 MG tablet Take 1 tablet (10 mg total) by mouth daily. 90 tablet 3   finasteride (PROSCAR) 5 MG tablet TAKE ONE TABLET EVERY DAY 90 tablet 3   JARDIANCE 10 MG TABS tablet TAKE ONE TABLET (10 MG) BY MOUTH EVERY DAY BEFORE BREAKFAST 90 tablet 3   latanoprost (XALATAN) 0.005 % ophthalmic solution Place 1 drop into both eyes at bedtime.     metFORMIN (GLUCOPHAGE) 500 MG tablet TAKE ONE TABLET BY MOUTH TWICE DAILY WITH A MEAL 180 tablet 3   pantoprazole (PROTONIX) 20 MG tablet TAKE 1 TABLET BY MOUTH DAILY 90 tablet 3   polyethylene glycol (MIRALAX / GLYCOLAX) 17 g packet Take 17 g by mouth daily.     tamsulosin (FLOMAX) 0.4 MG CAPS capsule TAKE 2 CAPSULES EVERY DAY 180 capsule 3   torsemide (DEMADEX) 20 MG tablet Take 1 tablet (20 mg total) by mouth daily as needed. For home weight over 185# 1 tablet 0   dorzolamide-timolol (COSOPT) 22.3-6.8 MG/ML ophthalmic solution 1 drop 2 (two) times daily.     losartan (COZAAR) 100 MG tablet Take 1 tablet (100 mg total)  by mouth daily. 90 tablet 3   No current facility-administered medications on file prior to visit.    No Known Allergies  Past Medical History:  Diagnosis Date   Abnormal glucose    Arthritis    Baker's cyst of knee    left   BPH (benign prostatic hypertrophy)    Cough    because of "tight" esophagus   Diabetes mellitus    Glaucoma    HOH (hard of hearing)    Hypertension    Pheochromocytoma of right adrenal gland    Stroke [facility])    Ulcer    Wears hearing aid    bilateral    Past Surgical History:  Procedure Laterality Date   adrenaletomy     CARDIAC CATHETERIZATION     CATARACT EXTRACTION W/PHACO Left 10/08/2015   Procedure: CATARACT EXTRACTION PHACO AND INTRAOCULAR LENS PLACEMENT ([place]) left eye;  Surgeon: Leandrew Koyanagi, MD;  Location: [place];  Service: Ophthalmology;  Laterality: Left;  [place]     RIGHT/LEFT HEART CATH AND CORONARY ANGIOGRAPHY Left 11/28/2019   Procedure: RIGHT/LEFT HEART CATH AND CORONARY ANGIOGRAPHY;  Surgeon: Nelva Bush, MD;  Location: [place] CV LAB;  Service: Cardiovascular;  Laterality: Left;   SQUAMOUS CELL CARCINOMA EXCISION  03-2006   left ear  TEE WITHOUT CARDIOVERSION N/A 10/12/2019   Procedure: TRANSESOPHAGEAL ECHOCARDIOGRAM (TEE);  Surgeon: Minna Merritts, MD;  Location: [facility];  Service: Cardiovascular;  Laterality: N/A;   TONSILLECTOMY     XI ROBOTIC LAPAROSCOPIC ASSISTED APPENDECTOMY N/A 10/11/2019   Procedure: XI ROBOTIC ASSISTED LAPAROSCOPIC INSERTION GASTROSTOMY TUBE;  Surgeon: Jules Husbands, MD;  Location: [facility];  Service: General;  Laterality: N/A;    Family History  Problem Relation Age of Onset   Alcohol abuse Mother    Coronary artery disease Mother    Brain cancer Father        tumor   Diabetes Brother     Social History   Socioeconomic History   Marital status: Married    Spouse name: Not on file   Number of children: Not on file   Years of education: Not on  file   Highest education level: Not on file  Occupational History   Occupation: chaplain  Tobacco Use   Smoking status: Never   Smokeless tobacco: Never  Vaping Use   Vaping Use: Never used  Substance and Sexual Activity   Alcohol use: Not Currently    Alcohol/week: 1.0 standard drink of alcohol    Types: 1 Glasses of wine per week    Comment: 1 glass per week   Drug use: No   Sexual activity: Not Currently  Other Topics Concern   Not on file  Social History Narrative   Regular exercise- yes- 3-4 times a week treadmill/walk   Diet: fruits and veggies,water   Living will, HCPOA (wife) , full code (reviewed 51)   Social Determinants of Health   Financial Resource Strain: Low Risk  (06/09/2021)   Overall Financial Resource Strain (CARDIA)    Difficulty of Paying Living Expenses: Not hard at all  Food Insecurity: No Food Insecurity (06/09/2021)   Hunger Vital Sign    Worried About Running Out of Food in the Last Year: Never true    [place] in the Last Year: Never true  Transportation Needs: No Transportation Needs (06/09/2021)   PRAPARE - Hydrologist (Medical): No    Lack of Transportation (Non-Medical): No  Physical Activity: Insufficiently Active (06/09/2021)   Exercise Vital Sign    Days of Exercise per Week: 2 days    Minutes of Exercise per Session: 60 min  Stress: No Stress Concern Present (06/09/2021)   [place]    Feeling of Stress : Not at all  Social Connections: [place] (06/09/2021)   Social Connection and Isolation Panel [NHANES]    Frequency of Communication with Friends and Family: More than three times a week    Frequency of Social Gatherings with Friends and Family: Three times a week    Attends Religious Services: More than 4 times per year    Active Member of Clubs or Organizations: Yes    Attends Archivist Meetings: More than 4 times per  year    Marital Status: Married  Human resources officer Violence: Not At Risk (06/09/2021)   Humiliation, Afraid, Rape, and Kick questionnaire    Fear of Current or Ex-Partner: No    Emotionally Abused: No    Physically Abused: No    Sexually Abused: No   Review of Systems Sleeps well Appetite is good Weight is stable Voids fine---nocturia x 2 Bowels okay with mirialax     Objective:   Physical Exam Constitutional:  Appearance: Normal appearance.  Cardiovascular:     Rate and Rhythm: Normal rate and regular rhythm.     Pulses: Normal pulses.     Heart sounds:     No gallop.     Comments: Gr 3/6 systolic murmur Pulmonary:     Effort: Pulmonary effort is normal.     Breath sounds: Normal breath sounds. No wheezing or rales.  Musculoskeletal:     Cervical back: Neck supple.     Comments: Puffy feet and trace ankle/calf edema  Lymphadenopathy:     Cervical: No cervical adenopathy.  Skin:    Comments: No foot lesions  Neurological:     Mental Status: He is alert.     Comments: Normal sensation in feet  Psychiatric:        Mood and Affect: Mood normal.        Behavior: Behavior normal.            Assessment & Plan:

## 2021-12-22 NOTE — Assessment & Plan Note (Signed)
Compensated with only slight edema Discussed using the torsemide for AM edema On zetia 10/atorvastatin20, jardiance, losartan 100

## 2021-12-22 NOTE — Assessment & Plan Note (Signed)
Lab Results  Component Value Date   HGBA1C 6.7 (A) 12/22/2021   Good control still with jardiance 10 and metformin 500 bid CHF okay

## 2021-12-23 ENCOUNTER — Other Ambulatory Visit: Payer: Self-pay | Admitting: Cardiovascular Disease

## 2021-12-23 LAB — PARATHYROID HORMONE, INTACT (NO CA): PTH: 24 pg/mL (ref 16–77)

## 2022-01-08 ENCOUNTER — Encounter: Payer: Self-pay | Admitting: Internal Medicine

## 2022-01-09 MED ORDER — FINASTERIDE 1 MG PO TABS: 1.0000 mg | ORAL_TABLET | Freq: Every day | ORAL | 3 refills | Status: DC

## 2022-01-12 NOTE — Telephone Encounter (Signed)
Please contact pt for future appointment. Pt due for 12 month f/u. 

## 2022-01-14 ENCOUNTER — Encounter: Payer: Self-pay | Admitting: Internal Medicine

## 2022-01-14 ENCOUNTER — Telehealth: Payer: Self-pay

## 2022-01-14 NOTE — Telephone Encounter (Signed)
Patient called in stating that he has been having dull abdominal pain for a couple of weeks but the pain is increasingly worse today. Tried OTC medicine and pain meds without any relief. Did have patient speak with access.

## 2022-01-15 ENCOUNTER — Emergency Department
Admission: EM | Admit: 2022-01-15 | Discharge: 2022-01-15 | Discharge status: Against Medical Advice | Payer: Medicare PPO | Attending: Emergency Medicine | Admitting: Emergency Medicine

## 2022-01-15 ENCOUNTER — Ambulatory Visit: Payer: Self-pay

## 2022-01-15 ENCOUNTER — Other Ambulatory Visit: Payer: Self-pay

## 2022-01-15 DIAGNOSIS — R1013 Epigastric pain: Secondary | ICD-10-CM | POA: Diagnosis not present

## 2022-01-15 DIAGNOSIS — K5900 Constipation, unspecified: Secondary | ICD-10-CM | POA: Diagnosis not present

## 2022-01-15 DIAGNOSIS — Z5321 Procedure and treatment not carried out due to patient leaving prior to being seen by health care provider: Secondary | ICD-10-CM | POA: Diagnosis not present

## 2022-01-15 DIAGNOSIS — R112 Nausea with vomiting, unspecified: Secondary | ICD-10-CM | POA: Insufficient documentation

## 2022-01-15 LAB — CBC
HCT: 45.4 % (ref 39.0–52.0)
Hemoglobin: 14.7 g/dL (ref 13.0–17.0)
MCH: 31.1 pg (ref 26.0–34.0)
MCHC: 32.4 g/dL (ref 30.0–36.0)
MCV: 96.2 fL (ref 80.0–100.0)
Platelets: 300 10*3/uL (ref 150–400)
RBC: 4.72 MIL/uL (ref 4.22–5.81)
RDW: 13.3 % (ref 11.5–15.5)
WBC: 7.9 10*3/uL (ref 4.0–10.5)
nRBC: 0 % (ref 0.0–0.2)

## 2022-01-15 LAB — URINALYSIS, ROUTINE W REFLEX MICROSCOPIC
Bacteria, UA: NONE SEEN
Bilirubin Urine: NEGATIVE
Glucose, UA: >=500 mg/dL — AB
Hgb urine dipstick: NEGATIVE
Ketones, ur: NEGATIVE mg/dL
Leukocytes,Ua: NEGATIVE
Nitrite: NEGATIVE
Protein, ur: NEGATIVE mg/dL
Specific Gravity, Urine: 1.007 (ref 1.005–1.030)
Squamous Epithelial / HPF: NONE SEEN (ref 0–5)
WBC, UA: NONE SEEN WBC/hpf (ref 0–5)
pH: 5 (ref 5.0–8.0)

## 2022-01-15 LAB — COMPREHENSIVE METABOLIC PANEL
ALT: 31 U/L (ref 0–44)
AST: 35 U/L (ref 15–41)
Albumin: 4.9 g/dL (ref 3.5–5.0)
Alkaline Phosphatase: 68 U/L (ref 38–126)
Anion gap: 9 (ref 5–15)
BUN: 47 mg/dL — ABNORMAL HIGH (ref 8–23)
CO2: 27 mmol/L (ref 22–32)
Calcium: 9.7 mg/dL (ref 8.9–10.3)
Chloride: 104 mmol/L (ref 98–111)
Creatinine, Ser: 1.57 mg/dL — ABNORMAL HIGH (ref 0.61–1.24)
GFR, Estimated: 42 mL/min — ABNORMAL LOW (ref >60)
Glucose, Bld: 134 mg/dL — ABNORMAL HIGH (ref 70–99)
Potassium: 4.8 mmol/L (ref 3.5–5.1)
Sodium: 140 mmol/L (ref 135–145)
Total Bilirubin: 1.1 mg/dL (ref 0.3–1.2)
Total Protein: 8 g/dL (ref 6.5–8.1)

## 2022-01-15 LAB — LIPASE, BLOOD: Lipase: 36 U/L (ref 11–51)

## 2022-01-15 NOTE — Telephone Encounter (Signed)
See Pt message; pt has appt [facility] 01/15/22 at 4:15 with UC & ED precautions.

## 2022-01-15 NOTE — ED Triage Notes (Signed)
Pt to ED via POV from home. Pt reports epigastric pain for the past few days, constipation and N/V. Pt has used fleet enemas at home with little relief. Pt denies CP or SOB.   Pt EKG showing afib but pt denies hx afib or being on blood thinners.

## 2022-01-15 NOTE — Telephone Encounter (Signed)
I spoke with pt and his interpreter pt has been having upper abd pain on and off with constipation; pain level now is 5. Pt did not go to ED last night due to wait time. No available appts at [facility] or [facility] and pt scheduled appt with [facility] 01/15/22 at 4:15 with UC & ED precautions and pt voiced understanding. Sending note to Dr Silvio Pate who is out of office as Juluis Rainier and [facility] CMA.

## 2022-01-17 ENCOUNTER — Emergency Department: Payer: Medicare PPO

## 2022-01-17 ENCOUNTER — Emergency Department
Admission: EM | Admit: 2022-01-17 | Discharge: 2022-01-17 | Disposition: A | Discharge status: Home / Self Care | Payer: Medicare PPO | Attending: Student in an Organized Health Care Education/Training Program | Admitting: Student in an Organized Health Care Education/Training Program

## 2022-01-17 ENCOUNTER — Other Ambulatory Visit: Payer: Self-pay

## 2022-01-17 DIAGNOSIS — I509 Heart failure, unspecified: Secondary | ICD-10-CM | POA: Diagnosis not present

## 2022-01-17 DIAGNOSIS — I130 Hypertensive heart and chronic kidney disease with heart failure and stage 1 through stage 4 chronic kidney disease, or unspecified chronic kidney disease: Secondary | ICD-10-CM | POA: Insufficient documentation

## 2022-01-17 DIAGNOSIS — Z7984 Long term (current) use of oral hypoglycemic drugs: Secondary | ICD-10-CM | POA: Diagnosis not present

## 2022-01-17 DIAGNOSIS — Z79899 Other long term (current) drug therapy: Secondary | ICD-10-CM | POA: Diagnosis not present

## 2022-01-17 DIAGNOSIS — E1122 Type 2 diabetes mellitus with diabetic chronic kidney disease: Secondary | ICD-10-CM | POA: Diagnosis not present

## 2022-01-17 DIAGNOSIS — K5900 Constipation, unspecified: Secondary | ICD-10-CM | POA: Diagnosis not present

## 2022-01-17 DIAGNOSIS — R1010 Upper abdominal pain, unspecified: Secondary | ICD-10-CM | POA: Diagnosis present

## 2022-01-17 DIAGNOSIS — N2889 Other specified disorders of kidney and ureter: Secondary | ICD-10-CM | POA: Diagnosis not present

## 2022-01-17 DIAGNOSIS — N281 Cyst of kidney, acquired: Secondary | ICD-10-CM | POA: Diagnosis not present

## 2022-01-17 DIAGNOSIS — I10 Essential (primary) hypertension: Secondary | ICD-10-CM | POA: Diagnosis not present

## 2022-01-17 DIAGNOSIS — N1832 Chronic kidney disease, stage 3b: Secondary | ICD-10-CM | POA: Insufficient documentation

## 2022-01-17 LAB — CBC
HCT: 42.3 % (ref 39.0–52.0)
Hemoglobin: 13.8 g/dL (ref 13.0–17.0)
MCH: 31.1 pg (ref 26.0–34.0)
MCHC: 32.6 g/dL (ref 30.0–36.0)
MCV: 95.3 fL (ref 80.0–100.0)
Platelets: 301 10*3/uL (ref 150–400)
RBC: 4.44 MIL/uL (ref 4.22–5.81)
RDW: 13.1 % (ref 11.5–15.5)
WBC: 8.6 10*3/uL (ref 4.0–10.5)
nRBC: 0 % (ref 0.0–0.2)

## 2022-01-17 LAB — URINALYSIS, ROUTINE W REFLEX MICROSCOPIC
Bacteria, UA: NONE SEEN
Bilirubin Urine: NEGATIVE
Glucose, UA: >=500 mg/dL — AB
Hgb urine dipstick: NEGATIVE
Ketones, ur: NEGATIVE mg/dL
Leukocytes,Ua: NEGATIVE
Nitrite: NEGATIVE
Protein, ur: NEGATIVE mg/dL
Specific Gravity, Urine: 1.02 (ref 1.005–1.030)
pH: 5 (ref 5.0–8.0)

## 2022-01-17 LAB — COMPREHENSIVE METABOLIC PANEL
ALT: 28 U/L (ref 0–44)
AST: 32 U/L (ref 15–41)
Albumin: 4.5 g/dL (ref 3.5–5.0)
Alkaline Phosphatase: 62 U/L (ref 38–126)
Anion gap: 10 (ref 5–15)
BUN: 48 mg/dL — ABNORMAL HIGH (ref 8–23)
CO2: 25 mmol/L (ref 22–32)
Calcium: 9.5 mg/dL (ref 8.9–10.3)
Chloride: 101 mmol/L (ref 98–111)
Creatinine, Ser: 1.55 mg/dL — ABNORMAL HIGH (ref 0.61–1.24)
GFR, Estimated: 43 mL/min — ABNORMAL LOW (ref >60)
Glucose, Bld: 125 mg/dL — ABNORMAL HIGH (ref 70–99)
Potassium: 4.7 mmol/L (ref 3.5–5.1)
Sodium: 136 mmol/L (ref 135–145)
Total Bilirubin: 1 mg/dL (ref 0.3–1.2)
Total Protein: 7.6 g/dL (ref 6.5–8.1)

## 2022-01-17 LAB — LIPASE, BLOOD: Lipase: 35 U/L (ref 11–51)

## 2022-01-17 MED ORDER — IOHEXOL 300 MG/ML  SOLN
80.0000 mL | Freq: Once | INTRAMUSCULAR | Status: AC | PRN
Start: 2022-01-17 — End: 2022-01-17
  Administered 2022-01-17: 80 mL via INTRAVENOUS

## 2022-01-17 MED ORDER — IOHEXOL 9 MG/ML PO SOLN
500.0000 mL | ORAL | Status: AC
  Administered 2022-01-17: 500 mL via ORAL
  Filled 2022-01-17 (×2): qty 500

## 2022-01-17 MED ORDER — SODIUM CHLORIDE 0.9 % IV BOLUS
1000.0000 mL | Freq: Once | INTRAVENOUS | Status: AC
  Administered 2022-01-17: 1000 mL via INTRAVENOUS

## 2022-01-17 NOTE — ED Provider Notes (Signed)
[facility] Emergency Department Provider Note   ____________________________________________   Event Date/Time   First MD Initiated Contact with Patient 01/17/22 1732     (approximate)  I have reviewed the triage vital signs and the nursing notes.   HISTORY  Chief Complaint Abdominal Pain    HPI Dustin Harrison is a 88 y.o. male reports to the emergency room with complaint of upper abdominal pain. Patient came to the emergency room 2 days ago, but left without being seen stating that he got tired of waiting.  Patient gives different history today than what he gave 2 days ago. Patient states that he has been having abdominal pain for the past 4 weeks.  He reports that the pain is in his mid upper abdomen.  He reports that it is a aching/throbbing pain.  He classifies it as a 3-5 out of 10.  He reports that initially he thought that it was constipation related.  However, in discussion with patient he is not having constipation. Patient reports that he has taken MiraLAX twice daily and having a bowel movement every day.  Patient reports that yesterday he did have to take a fleets enema in order to have a bowel movement. Patient reports that his appetite is good.  He reports that he is eating and drinking as normal. Patient denies any urinary symptoms. Patient denies nausea/vomiting/fever. Patient denies chest pain/palpitations/shortness of breath/dyspnea on exertion. Patient denies any heartburn/indigestion/burping/gas. Patient is noted to have some abdominal distention.  However, he reports that this is normal for him. Patient's abdomen is nontender on exam.  There is no rebound tenderness/guarding.    Past Medical History:  Diagnosis Date   Abnormal glucose    Arthritis    Baker's cyst of knee    left   BPH (benign prostatic hypertrophy)    Cough    because of "tight" esophagus   Diabetes mellitus    Glaucoma    HOH (hard of hearing)     Hypertension    Pheochromocytoma of right adrenal gland    Stroke [facility])    Ulcer    Wears hearing aid    bilateral    Patient Active Problem List   Diagnosis Date Noted   History of CVA (cerebrovascular accident) 06/13/2020   Vocal cord paralysis 12/27/2019   HFrEF (heart failure with reduced ejection fraction) (HCC)    Chronic back pain 10/07/2019   Stage 3b chronic kidney disease ([place]) 04/23/2016   Hearing loss 04/23/2016   Hypertension associated with diabetes ([place]) 08/22/2012   Diabetes mellitus with cardiac complication ([place]) 65/68/1275   Hyperlipidemia associated with type 2 diabetes mellitus ([place]) 05/12/2009   History of pheochromocytoma 09/07/2007   GLAUCOMA 09/07/2007   History of duodenal ulcer 09/07/2007   BPH (benign prostatic hyperplasia) 09/07/2007   OSTEOARTHRITIS 09/07/2007    Past Surgical History:  Procedure Laterality Date   adrenaletomy     CARDIAC CATHETERIZATION     CATARACT EXTRACTION W/PHACO Left 10/08/2015   Procedure: CATARACT EXTRACTION PHACO AND INTRAOCULAR LENS PLACEMENT ([place]) left eye;  Surgeon: Leandrew Koyanagi, MD;  Location: [place];  Service: Ophthalmology;  Laterality: Left;  [place]     RIGHT/LEFT HEART CATH AND CORONARY ANGIOGRAPHY Left 11/28/2019   Procedure: RIGHT/LEFT HEART CATH AND CORONARY ANGIOGRAPHY;  Surgeon: Nelva Bush, MD;  Location: [place] [facility];  Service: Cardiovascular;  Laterality: Left;   SQUAMOUS CELL CARCINOMA EXCISION  03-2006   left ear  TEE WITHOUT CARDIOVERSION N/A 10/12/2019   Procedure: TRANSESOPHAGEAL ECHOCARDIOGRAM (TEE);  Surgeon: Minna Merritts, MD;  Location: [facility];  Service: Cardiovascular;  Laterality: N/A;   TONSILLECTOMY     XI ROBOTIC LAPAROSCOPIC ASSISTED APPENDECTOMY N/A 10/11/2019   Procedure: XI ROBOTIC ASSISTED LAPAROSCOPIC INSERTION GASTROSTOMY TUBE;  Surgeon: Jules Husbands, MD;  Location: [facility];  Service: General;  Laterality: N/A;    Prior to  Admission medications   Medication Sig Start Date End Date Taking? Authorizing Provider  amLODipine (NORVASC) 5 MG tablet Take 1 tablet (5 mg total) by mouth in the morning and at bedtime. 02/02/21   Minna Merritts, MD  atorvastatin (LIPITOR) 20 MG tablet Take 1 tablet (20 mg total) by mouth daily. 02/02/21   Minna Merritts, MD  dorzolamide-timolol (COSOPT) 22.3-6.8 MG/ML ophthalmic solution 1 drop 2 (two) times daily. 12/12/21   [provider]  ezetimibe (ZETIA) 10 MG tablet TAKE ONE TABLET BY MOUTH EVERY DAY 01/14/22   Minna Merritts, MD  finasteride (PROPECIA) 1 MG tablet Take 1 tablet (1 mg total) by mouth daily. 01/09/22   [name], MD  finasteride (PROSCAR) 5 MG tablet TAKE ONE TABLET EVERY DAY 08/25/21   [name], MD  JARDIANCE 10 MG TABS tablet TAKE ONE TABLET (10 MG) BY MOUTH EVERY DAY BEFORE BREAKFAST 08/25/21   [name], MD  latanoprost (XALATAN) 0.005 % ophthalmic solution Place 1 drop into both eyes at bedtime. 10/24/19   [provider]  losartan (COZAAR) 100 MG tablet Take 1 tablet (100 mg total) by mouth daily. 02/02/21 05/03/21  Minna Merritts, MD  metFORMIN (GLUCOPHAGE) 500 MG tablet TAKE ONE TABLET BY MOUTH TWICE DAILY WITH A MEAL 08/25/21   Viviana Simpler I, MD  pantoprazole (PROTONIX) 20 MG tablet TAKE 1 TABLET BY MOUTH DAILY 06/19/21   Viviana Simpler I, MD  polyethylene glycol (MIRALAX / GLYCOLAX) 17 g packet Take 17 g by mouth daily.    [provider]  tamsulosin (FLOMAX) 0.4 MG CAPS capsule TAKE 2 CAPSULES EVERY DAY 08/25/21   Viviana Simpler I, MD  torsemide (DEMADEX) 20 MG tablet Take 1 tablet (20 mg total) by mouth daily as needed. For home weight over 185# 06/17/21   [name], MD    Allergies Patient has no known allergies.  Family History  Problem Relation Age of Onset   Alcohol abuse Mother    Coronary artery disease Mother    Brain cancer Father        tumor   Diabetes Brother     Social  History Social History   Tobacco Use   Smoking status: Never   Smokeless tobacco: Never  Vaping Use   Vaping Use: Never used  Substance Use Topics   Alcohol use: Not Currently    Alcohol/week: 1.0 standard drink of alcohol    Types: 1 Glasses of wine per week    Comment: 1 glass per week   Drug use: No    Review of Systems  Constitutional: No fever/chills Eyes: No visual changes. ENT: No sore throat. Cardiovascular: Denies chest pain. Respiratory: Denies shortness of breath. Gastrointestinal:  No nausea, no vomiting.  No diarrhea.  No constipation.  Positive for abdominal pain Genitourinary: Negative for dysuria. Musculoskeletal: Negative for back pain. Skin: Negative for rash. Neurological: Negative for headaches, focal weakness or numbness.   ____________________________________________   PHYSICAL EXAM:  VITAL SIGNS: ED Triage Vitals [01/17/22 1718]  Enc Vitals Group  BP 112/66     Pulse Rate 67     Resp 18     Temp 99 F (37.2 C)     Temp Source Oral     SpO2 98 %     Weight 182 lb (82.6 kg)     Height '5\' 11"'$  (1.803 m)     Head Circumference      Peak Flow      Pain Score 5     Pain Loc      Pain Edu?      Excl. in [employer]?     Constitutional: Alert and oriented. Well appearing and in no acute distress. Eyes: Conjunctivae are normal. PERRL. EOMI. Head: Atraumatic. Nose: No congestion/rhinnorhea. Mouth/Throat: Mucous membranes are moist.  Oropharynx non-erythematous. Neck: No stridor.   Cardiovascular: Normal rate, regular rhythm. Grossly normal heart sounds.  Good peripheral circulation. Respiratory: Normal respiratory effort.  No retractions. Lungs CTAB. Gastrointestinal: Slightly distended but nontender.No abdominal bruits.  No rebound tenderness or guarding.  Positive for bowel sounds in all 4 quadrants though diminished.  No spleen or liver enlargement noted on exam.  No CVA tenderness. Musculoskeletal: No lower extremity tenderness nor edema.  No  joint effusions. Neurologic:  Normal speech and language. No gross focal neurologic deficits are appreciated. No gait instability. Skin:  Skin is warm, dry and intact. No rash noted. Psychiatric: Mood and affect are normal. Speech and behavior are normal.  ____________________________________________   LABS (all labs ordered are listed, but only abnormal results are displayed)  Labs Reviewed  COMPREHENSIVE METABOLIC PANEL - Abnormal; Notable for the following components:      Result Value   Glucose, Bld 125 (*)    BUN 48 (*)    Creatinine, Ser 1.55 (*)    GFR, Estimated 43 (*)    All other components within normal limits  URINALYSIS, ROUTINE W REFLEX MICROSCOPIC - Abnormal; Notable for the following components:   Color, Urine YELLOW (*)    APPearance HAZY (*)    Glucose, UA >=500 (*)    All other components within normal limits  LIPASE, BLOOD  CBC   ____________________________________________  EKG  EKG was obtained/read by Dr. Quentin Cornwall ____________________________________________  [place]  ED MD interpretation:    Official radiology report(s): No results found.  ____________________________________________   PROCEDURES  Procedure(s) performed: None  Procedures  Critical Care performed: No  ____________________________________________   INITIAL IMPRESSION / ASSESSMENT AND PLAN / ED COURSE     Delmore Sear is a 88 y.o. male reports to the emergency room with complaint of upper abdominal pain. Patient came to the emergency room 2 days ago, but left without being seen stating that he got tired of waiting.  Patient gives different history today than what he gave 2 days ago. Patient states that he has been having abdominal pain for the past 4 weeks.  He reports that the pain is in his mid upper abdomen.  He reports that it is a aching/throbbing pain.  He classifies it as a 3-5 out of 10.  He reports that initially he thought that it was constipation  related.  However, in discussion with patient he is not having constipation. Patient reports that he has taken MiraLAX twice daily and having a bowel movement every day.  Patient reports that yesterday he did have to take a fleets enema in order to have a bowel movement. Patient reports that his appetite is good.  He reports that he is eating and drinking as  normal. Patient denies any urinary symptoms. Patient denies nausea/vomiting/fever. Patient denies chest pain/palpitations/shortness of breath/dyspnea on exertion. Patient denies any heartburn/indigestion/burping/gas. Patient is noted to have some abdominal distention.  However, he reports that this is normal for him. Patient's abdomen is nontender on exam.  There is no rebound tenderness/guarding.   When patient was here 2 days ago he had EKG that showed A-fib.  However patient denies ever having history of A-fib.  Today he denies any chest pain/palpitations/shortness of breath/dizziness.   CT of abdomen ordered. Patient has history of chronic kidney disease.  Based on GFR of 43 will provide saline bolus to help with contrast from CT.  Otherwise, patient's kidney function is stable for him.  CMP is unremarkable other than kidney function, CBC is normal. Urinalysis is normal other than large amount of glucose. Patient will need to follow-up with primary care provider to establish cause of this.   9:10 PM report is given to Mathis PA who is taking over care.  We are awaiting CT of abdomen and pelvis with contrast.     ____________________________________________   FINAL CLINICAL IMPRESSION(S) / ED DIAGNOSES  Final diagnoses:  None     ED Discharge Orders     None        Note:  This document was prepared using Dragon voice recognition software and may include unintentional dictation errors.     Willaim Rayas, NP 01/17/22 2109    Merlyn Lot, MD 01/18/22 1346

## 2022-01-17 NOTE — Discharge Instructions (Signed)
Your exam and CT reveal constipation.  You should consider increasing the MiraLAX to 3-4 times daily.  Should also transition to a clear liquid diet for the next 2 to 3 days to help promote a meaningful stool.  You may mix the MiraLAX with your juice, coffee, tea, or electrolyte solution (Gatorade, Powerade, Pedialyte).  Follow-up with your primary provider for ongoing symptoms or return to the ED if needed.  Your CT also reveals some need for an evaluation of your kidneys and adrenal glands with an outpatient MRI.  Please coordinate with your primary provider to have this done.  May also follow-up with urology as needed.

## 2022-01-17 NOTE — ED Triage Notes (Addendum)
Pt states he has chronic abdominal pain and has been using miralax and fleet enemas without relief x 2 days ago. Pt states he does not know when his last normal bm was. Pt states his pain is generalized and higher in the abdomen. Pt denies SOB. Pt states abdominal pain has been going on for several weeks.

## 2022-01-17 NOTE — ED Provider Notes (Signed)
----------------------------------------- 10:25 PM on 01/17/2022 -----------------------------------------  Blood pressure 112/66, pulse 67, temperature 99 F (37.2 C), temperature source Oral, resp. rate 18, height '5\' 11"'$  (1.803 m), weight 82.6 kg, SpO2 98 %.  Assuming care from Dr. Galen Daft, PA-C/NP-C.  In short, Dustin Harrison is a 88 y.o. male with a chief complaint of Abdominal Pain .  Refer to the original H&P for additional details.  The current plan of care is to wait CT results and disposition accordingly.  ____________________________________________   LABS (pertinent positives/negatives) Labs Reviewed  COMPREHENSIVE METABOLIC PANEL - Abnormal; Notable for the following components:      Result Value   Glucose, Bld 125 (*)    BUN 48 (*)    Creatinine, Ser 1.55 (*)    GFR, Estimated 43 (*)    All other components within normal limits  URINALYSIS, ROUTINE W REFLEX MICROSCOPIC - Abnormal; Notable for the following components:   Color, Urine YELLOW (*)    APPearance HAZY (*)    Glucose, UA >=500 (*)    All other components within normal limits  LIPASE, BLOOD  CBC   ____________________________________________  RADIOLOGY  CT ABDOMEN PELVIS W CONTRAST  Result Date: 01/17/2022 CLINICAL DATA:  Acute abdominal pain. EXAM: CT ABDOMEN AND PELVIS WITH CONTRAST TECHNIQUE: Multidetector CT imaging of the abdomen and pelvis was performed using the standard protocol following bolus administration of intravenous contrast. RADIATION DOSE REDUCTION: This exam was performed according to the departmental dose-optimization program which includes automated exposure control, adjustment of the mA and/or kV according to patient size and/or use of iterative reconstruction technique. CONTRAST:  51m OMNIPAQUE IOHEXOL 300 MG/ML  SOLN COMPARISON:  CT abdomen and pelvis 10/13/2019 FINDINGS: Lower chest: No acute abnormality. Hepatobiliary: No focal liver abnormality is seen. No gallstones,  gallbladder wall thickening, or biliary dilatation. Calcified granulomas are present. Pancreas: Unremarkable. No pancreatic ductal dilatation or surrounding inflammatory changes. Spleen: Calcified granulomas are present. Spleen is otherwise within normal limits. Adrenals/Urinary Tract: There is a focal density measuring 1.4 x 2.1 cm in 61 Hounsfield units which may represent exophytic right renal lesion versus right adrenal lesion. Left adrenal gland is within normal limits. Right renal atrophy and scarring is again seen. Left kidney is normal in size. There is no hydronephrosis or perinephric fluid. There are cortical cysts in the right kidney which are mildly increased in size compared to prior. There is a nodular density superior to the right kidney measuring 13 mm image 2/21. This is indeterminate and has increased in size. The bladder is within normal limits. Stomach/Bowel: Stomach is within normal limits. No evidence of bowel wall thickening, distention, or inflammatory changes. There is a large amount of stool throughout the entire colon. The appendix is not seen. Vascular/Lymphatic: Aortic atherosclerosis. No enlarged abdominal or pelvic lymph nodes. Reproductive: Prostate is unremarkable. Other: No abdominal wall hernia or abnormality. No abdominopelvic ascites. Musculoskeletal: Degenerative changes affect the spine and hips. IMPRESSION: 1. There is a large amount of stool throughout the entire colon compatible with constipation. No bowel obstruction. 2. There are 2 indeterminate lesions superior to the right kidney which may be related to the right adrenal gland. Recommend further evaluation with MRI. Both of these lesions have increased in size compared to the prior study. Electronically Signed   By: ARonney AstersM.D.   On: 01/17/2022 21:43   ____________________________________________  PROCEDURES   Procedures ____________________________________________  INITIAL IMPRESSION / ASSESSMENT AND  PLAN / ED COURSE   Patient to the ED  for evaluation of chronic abdominal pain with ongoing constipation.  Patient reports limited benefit with MiraLAX twice daily for the last 2 days.  Patient was evaluated for his complaints in ED, found have a reassuring work-up overall.  He did have CT evidence of moderate stool burden without acute colitis or appendicitis.  Is a dental finding of right adrenal gland lesions warranting further outpatient MRI evaluation.  Patient is made aware, and a copy of the CT is provided.  Patient be discharged instructions to increase his MiraLAX to 3 times daily, and he should revert to a clear liquid diet for the next 2 to 3 days to help promote a meaningful stool.  He should follow with primary provider return to the ED if needed. ____________________________________________  FINAL CLINICAL IMPRESSION(S) / ED DIAGNOSES  Final diagnoses:  Constipation, unspecified constipation type      Makynzie Dobesh, Dannielle Karvonen, PA-C 01/17/22 2227    Naaman Plummer, MD 01/17/22 2316

## 2022-01-27 NOTE — Progress Notes (Unsigned)
Cardiology Clinic Note   Patient Name: Demarus Latterell Date of Encounter: 01/28/2022  Primary Care Provider:  [name], MD Primary Cardiologist:  Ida Rogue, MD  Patient Profile    88 year old male with a history of HFrEF, bradycardia, history of pheochromocytoma, right adrenal tumor status post right adrenalectomy, cochlear implant on the left 06/2018, diabetes type 2, chronic back pain, bacteremia 10/2019 status post TEE and IV antibiotic therapy, CKD, CAD, aortic stenosis, and syncope, who presents today to follow up on his coronary artery disease.  Past Medical History    Past Medical History:  Diagnosis Date   Abnormal glucose    Arthritis    Baker's cyst of knee    left   BPH (benign prostatic hypertrophy)    Cough    because of "tight" esophagus   Diabetes mellitus    Glaucoma    HOH (hard of hearing)    Hypertension    Pheochromocytoma of right adrenal gland    Stroke [facility])    Ulcer    Wears hearing aid    bilateral   Past Surgical History:  Procedure Laterality Date   adrenaletomy     CARDIAC CATHETERIZATION     CATARACT EXTRACTION W/PHACO Left 10/08/2015   Procedure: CATARACT EXTRACTION PHACO AND INTRAOCULAR LENS PLACEMENT ([place]) left eye;  Surgeon: Leandrew Koyanagi, MD;  Location: [facility];  Service: Ophthalmology;  Laterality: Left;  [place]     RIGHT/LEFT HEART CATH AND CORONARY ANGIOGRAPHY Left 11/28/2019   Procedure: RIGHT/LEFT HEART CATH AND CORONARY ANGIOGRAPHY;  Surgeon: Nelva Bush, MD;  Location: [place] [facility];  Service: Cardiovascular;  Laterality: Left;   SQUAMOUS CELL CARCINOMA EXCISION  03-2006   left ear   TEE WITHOUT CARDIOVERSION N/A 10/12/2019   Procedure: TRANSESOPHAGEAL ECHOCARDIOGRAM (TEE);  Surgeon: Minna Merritts, MD;  Location: [facility] ORS;  Service: Cardiovascular;  Laterality: N/A;   TONSILLECTOMY     XI ROBOTIC LAPAROSCOPIC ASSISTED APPENDECTOMY N/A 10/11/2019   Procedure: XI  ROBOTIC ASSISTED LAPAROSCOPIC INSERTION GASTROSTOMY TUBE;  Surgeon: Jules Husbands, MD;  Location: [facility] ORS;  Service: General;  Laterality: N/A;    Allergies  No Known Allergies  History of Present Illness    88 year old male with a history of HFrEF, bradycardia, history of pheochromocytoma, right adrenal tumor status post right adrenalectomy, cochlear implant on the left 06/2018, type 2 diabetes, chronic back pain, bacteremia in 10/2019 status post TEE and IV antibiotic therapy, CKD, CAD, aortic stenosis, and syncope.  He was admitted to [facility] on 10/09/2019 evaluated in the setting of positive cultures for GBS bacteremia.  Source of infection was not identified.  TEE with LVEF of 25-35%, GR 1 DD, significant wall motion abnormalities with images suggested of stress-induced CM versus LAD infarct.  TTE with no evidence of vegetation/endocarditis and moderate aortic stenosis not visualized on TTE.    In 6/21 he had noted a recent syncopal episode with loss of consciousness for 10 seconds when he was evaluated in clinic.  Cardiac cath 11/28/2019 with severe single-vessel coronary disease with 90% ostial stenosis involving the small ramus intermedius that fills via right to left collaterals some mild to moderate nonobstructive CAD involving LAD, left circumflex, RCA, upper normal to mildly elevated L/R heart filling pressures, and mild to moderate AS.  Repeat echocardiogram done 01/14/2020 with EF normalized with LVEF of 60 to 65%, no RWMA, mild LVH, RV normal size and function, trivial MR, mild dilatation of ascending aorta  39 mm.  In 09/2020 he had had a few falls related to lightheadedness with no significant injury had a 7-day ZIO monitor that was placed and was noted to have hyperkalemia with potassium 5.9.  ZIO monitor revealed 16 runs of fast heart rate that could cause occasional palpitations and occasional early beats but overall no significant arrhythmia or pauses that would cause his lightheadedness  or syncopal episode.  With the discontinuation of spironolactone and as needed medications given his potassium normalized.  He was last seen in clinic 02/02/2021 by Dr. Rockey Situ with no changes made.  Returns to clinic today after recent evaluation in the [facility] emergency department on 01/19/2022 from constipation.  CT of the abdomen pelvis with contrast was completed that showed a large amount of stool throughout the entire colon compatible with constipation without bowel obstruction.  There were 2 indeterminate lesions superior to the right kidney which are related to the right adrenal gland.  Recommend further evaluation with MRI of both these lesions and increase in size compared to prior studies he is accompanied by his wife today.  He denies any shortness of breath, chest pain, or palpitations.  He also denies any recurrent abdominal pain since increasing his MiraLAX to 3 times daily as recommended by the emergency department.  Home Medications    Current Outpatient Medications  Medication Sig Dispense Refill   amLODipine (NORVASC) 5 MG tablet Take 1 tablet (5 mg total) by mouth in the morning and at bedtime. 180 tablet 3   atorvastatin (LIPITOR) 20 MG tablet Take 1 tablet (20 mg total) by mouth daily. 90 tablet 3   dorzolamide-timolol (COSOPT) 22.3-6.8 MG/ML ophthalmic solution 1 drop 2 (two) times daily.     ezetimibe (ZETIA) 10 MG tablet TAKE ONE TABLET BY MOUTH EVERY DAY 90 tablet 0   finasteride (PROPECIA) 1 MG tablet Take 1 tablet (1 mg total) by mouth daily. 90 tablet 3   finasteride (PROSCAR) 5 MG tablet TAKE ONE TABLET EVERY DAY 90 tablet 3   JARDIANCE 10 MG TABS tablet TAKE ONE TABLET (10 MG) BY MOUTH EVERY DAY BEFORE BREAKFAST 90 tablet 3   latanoprost (XALATAN) 0.005 % ophthalmic solution Place 1 drop into both eyes at bedtime.     losartan (COZAAR) 100 MG tablet Take 1 tablet (100 mg total) by mouth daily. 90 tablet 3   metFORMIN (GLUCOPHAGE) 500 MG tablet TAKE ONE TABLET BY MOUTH TWICE  DAILY WITH A MEAL 180 tablet 3   metoprolol tartrate (LOPRESSOR) 25 MG tablet Take 1 tablet (25 mg total) by mouth 2 (two) times daily. 60 tablet 3   pantoprazole (PROTONIX) 20 MG tablet TAKE 1 TABLET BY MOUTH DAILY 90 tablet 3   polyethylene glycol (MIRALAX / GLYCOLAX) 17 g packet Take 17 g by mouth daily.     tamsulosin (FLOMAX) 0.4 MG CAPS capsule TAKE 2 CAPSULES EVERY DAY 180 capsule 3   torsemide (DEMADEX) 20 MG tablet Take 1 tablet (20 mg total) by mouth daily as needed. For home weight over 185# 1 tablet 0   meloxicam (MOBIC) 7.5 MG tablet SMARTSIG:1 Tablet(s) By Mouth Morning-Night     No current facility-administered medications for this visit.     Family History    Family History  Problem Relation Age of Onset   Alcohol abuse Mother    Coronary artery disease Mother    Brain cancer Father        tumor   Diabetes Brother    He indicated that the status  of his mother is unknown. He indicated that the status of his father is unknown. He indicated that the status of his brother is unknown.  Social History    Social History   Socioeconomic History   Marital status: Married    Spouse name: Not on file   Number of children: Not on file   Years of education: Not on file   Highest education level: Not on file  Occupational History   Occupation: chaplain  Tobacco Use   Smoking status: Never   Smokeless tobacco: Never  Vaping Use   Vaping Use: Never used  Substance and Sexual Activity   Alcohol use: Not Currently    Alcohol/week: 1.0 standard drink of alcohol    Types: 1 Glasses of wine per week    Comment: 1 glass per week   Drug use: No   Sexual activity: Not Currently  Other Topics Concern   Not on file  Social History Narrative   Regular exercise- yes- 3-4 times a week treadmill/walk   Diet: fruits and veggies,water   Living will, HCPOA (wife) , full code (reviewed 77)   Social Determinants of Health   Financial Resource Strain: Low Risk  (06/09/2021)    Overall Financial Resource Strain (CARDIA)    Difficulty of Paying Living Expenses: Not hard at all  Food Insecurity: No Food Insecurity (06/09/2021)   Hunger Vital Sign    Worried About Running Out of Food in the Last Year: Never true    Ran Out of Food in the Last Year: Never true  Transportation Needs: No Transportation Needs (06/09/2021)   PRAPARE - Hydrologist (Medical): No    Lack of Transportation (Non-Medical): No  Physical Activity: Insufficiently Active (06/09/2021)   Exercise Vital Sign    Days of Exercise per Week: 2 days    Minutes of Exercise per Session: 60 min  Stress: No Stress Concern Present (06/09/2021)   [place]    Feeling of Stress : Not at all  Social Connections: [place] (06/09/2021)   Social Connection and Isolation Panel [NHANES]    Frequency of Communication with Friends and Family: More than three times a week    Frequency of Social Gatherings with Friends and Family: Three times a week    Attends Religious Services: More than 4 times per year    Active Member of Clubs or Organizations: Yes    Attends Archivist Meetings: More than 4 times per year    Marital Status: Married  Human resources officer Violence: Not At Risk (06/09/2021)   Humiliation, Afraid, Rape, and Kick questionnaire    Fear of Current or Ex-Partner: No    Emotionally Abused: No    Physically Abused: No    Sexually Abused: No     Review of Systems    General:  No chills, fever, night sweats or weight changes.  Cardiovascular:  No chest pain, dyspnea on exertion, edema, orthopnea, palpitations, paroxysmal nocturnal dyspnea. Dermatological: No rash, lesions/masses Respiratory: No cough, dyspnea Urologic: No hematuria, dysuria Abdominal:   No nausea, vomiting, diarrhea, bright red blood per rectum, melena, or hematemesis Neurologic:  No visual changes, wkns, changes in mental  status. All other systems reviewed and are otherwise negative except as noted above.   Physical Exam    VS:  BP 108/62 (BP Location: Left Arm, Patient Position: Sitting, Cuff Size: Normal)   Pulse (!) 130   Ht '5\' 11"'$  (  1.803 m)   Wt 167 lb (75.8 kg)   SpO2 91%   BMI 23.29 kg/m  , BMI Body mass index is 23.29 kg/m.     GEN: Well nourished, well developed, in no acute distress. HEENT: normal. HOH Neck: Supple, no JVD, carotid bruits, or masses. Cardiac: RRR tachycardiac, no murmurs, rubs, or gallops. No clubbing, cyanosis, edema.  Radials/DP/PT 2+ and equal bilaterally.  Respiratory:  Respirations regular and unlabored, clear to auscultation bilaterally. GI: Soft, nontender, nondistended, BS + x 4. MS: no deformity or atrophy. Skin: warm and dry, no rash. Neuro:  Strength and sensation are intact. Psych: Normal affect.  Accessory Clinical Findings    ECG personally reviewed by me today-sinus tach with a rate of 130 and LVH could be underlying atrial flutter but rate is too fast to determine at this time- No acute changes  Lab Results  Component Value Date   WBC 8.6 01/17/2022   HGB 13.8 01/17/2022   HCT 42.3 01/17/2022   MCV 95.3 01/17/2022   PLT 301 01/17/2022   Lab Results  Component Value Date   CREATININE 1.55 (H) 01/17/2022   BUN 48 (H) 01/17/2022   NA 136 01/17/2022   K 4.7 01/17/2022   CL 101 01/17/2022   CO2 25 01/17/2022   Lab Results  Component Value Date   ALT 28 01/17/2022   AST 32 01/17/2022   ALKPHOS 62 01/17/2022   BILITOT 1.0 01/17/2022   Lab Results  Component Value Date   CHOL 139 12/22/2021   HDL 80.40 12/22/2021   LDLCALC 44 12/22/2021   LDLDIRECT 126.2 04/05/2013   TRIG 70.0 12/22/2021   CHOLHDL 2 12/22/2021    Lab Results  Component Value Date   HGBA1C 6.7 (A) 12/22/2021    Assessment & Plan   1.  Sinus tachycardia noted on EKG today with complaints of 130 recheck of 128.  Patient is totally asymptomatic for elevated heart rate.   Blood pressure typically runs systolic 809 today he was 108.  Recently in the emergency department he had an EKG that was done that showed possible atrial fibrillation with a rate in the 60s.  According to patient and his wife he typically runs bradycardic.  Sinus tachycardia noted on EKG today which potentially could be underlying atrial flutter with rates to best determine that this time he has been started on beta-blocker therapy metoprolol 25 mg twice daily.  He is also being sent for CBC, BMP, BNP, and a D-dimer to rule out any underlying causes for his tachycardia.  We did recheck his oxygen saturation after he was sitting still in the room and noted a heart heart rate remains still in the 120s but his oxygen saturation was up to 98% on room air.  He has been scheduled for repeat echocardiogram with hopes that his rate is better controlled.  He had previously been on a ZIO monitor 4/22 which showed some junctional rhythm and 16 months of fast heartbeat which would cause occasional palpitations.  Overall no significant arrhythmia or pauses were present.  2.  Coronary artery disease with stable angina.  He currently has no symptoms of angina today.  Right and left heart catheterization 11/28/2019 with severe single-vessel CAD with 90% ostial stenosis involving small ramus and mid this branch that fills with right to left collaterals as well as mild to moderate nonobstructive CAD in the LAD, LCx, RCA, recommended for medical management.  There is no further work-up at this time.  Continue current  medication regimen.  3.  Hypertension blood pressure 108/62..  Blood pressure has been stable without any concerns labile pressures.  He is continue his current medication regimen.  Daily and losartan 100 mg daily and torsemide 20 mg as needed.  4.  Hyperlipidemia with LDL 44 on 12/22/2021.  He is to continue on atorvastatin 20 mg daily.  No changes made to his medication regimen today.  5.  HFimpEF with last LVEF  60-65%.  Goal-directed medical therapy is limited due to his symptoms previously of lightheadedness, syncope, dizziness, and hyperkalemia.  Euvolemic on exam today.  No changes to his medication regimen today with the exception of addition of beta-blocker therapy for heart rate control.  Repeat echocardiogram.  6.  Disposition patient return to clinic, Monday with follow-up EKG to determine response to beta-blocker therapy.   Trygve Thal, NP 01/28/2022, 10:16 AM

## 2022-01-28 ENCOUNTER — Other Ambulatory Visit
Admission: RE | Admit: 2022-01-28 | Discharge: 2022-01-28 | Disposition: A | Discharge status: Home / Self Care | Payer: Medicare PPO | Attending: Cardiology | Admitting: Cardiology

## 2022-01-28 ENCOUNTER — Encounter: Payer: Self-pay | Admitting: Cardiology

## 2022-01-28 ENCOUNTER — Ambulatory Visit: Payer: Medicare PPO | Admitting: Cardiology

## 2022-01-28 ENCOUNTER — Telehealth: Payer: Self-pay | Admitting: *Deleted

## 2022-01-28 VITALS — BP 108/62 | HR 130 | Ht 71.0 in | Wt 167.0 lb

## 2022-01-28 DIAGNOSIS — I10 Essential (primary) hypertension: Secondary | ICD-10-CM

## 2022-01-28 DIAGNOSIS — I420 Dilated cardiomyopathy: Secondary | ICD-10-CM

## 2022-01-28 DIAGNOSIS — I5020 Unspecified systolic (congestive) heart failure: Secondary | ICD-10-CM

## 2022-01-28 DIAGNOSIS — E785 Hyperlipidemia, unspecified: Secondary | ICD-10-CM | POA: Diagnosis not present

## 2022-01-28 DIAGNOSIS — R7989 Other specified abnormal findings of blood chemistry: Secondary | ICD-10-CM

## 2022-01-28 DIAGNOSIS — R000 Tachycardia, unspecified: Secondary | ICD-10-CM

## 2022-01-28 DIAGNOSIS — I25118 Atherosclerotic heart disease of native coronary artery with other forms of angina pectoris: Secondary | ICD-10-CM

## 2022-01-28 LAB — BASIC METABOLIC PANEL
Anion gap: 12 (ref 5–15)
BUN: 51 mg/dL — ABNORMAL HIGH (ref 8–23)
CO2: 26 mmol/L (ref 22–32)
Calcium: 9.7 mg/dL (ref 8.9–10.3)
Chloride: 99 mmol/L (ref 98–111)
Creatinine, Ser: 1.82 mg/dL — ABNORMAL HIGH (ref 0.61–1.24)
GFR, Estimated: 35 mL/min — ABNORMAL LOW (ref >60)
Glucose, Bld: 149 mg/dL — ABNORMAL HIGH (ref 70–99)
Potassium: 4.1 mmol/L (ref 3.5–5.1)
Sodium: 137 mmol/L (ref 135–145)

## 2022-01-28 LAB — CBC
HCT: 46.7 % (ref 39.0–52.0)
Hemoglobin: 15.2 g/dL (ref 13.0–17.0)
MCH: 30.8 pg (ref 26.0–34.0)
MCHC: 32.5 g/dL (ref 30.0–36.0)
MCV: 94.5 fL (ref 80.0–100.0)
Platelets: 340 10*3/uL (ref 150–400)
RBC: 4.94 MIL/uL (ref 4.22–5.81)
RDW: 13 % (ref 11.5–15.5)
WBC: 12.7 10*3/uL — ABNORMAL HIGH (ref 4.0–10.5)
nRBC: 0 % (ref 0.0–0.2)

## 2022-01-28 LAB — BRAIN NATRIURETIC PEPTIDE: B Natriuretic Peptide: 165.3 pg/mL — ABNORMAL HIGH (ref 0.0–100.0)

## 2022-01-28 LAB — D-DIMER, QUANTITATIVE: D-Dimer, Quant: 1.62 ug/mL-FEU — ABNORMAL HIGH (ref 0.00–0.50)

## 2022-01-28 MED ORDER — METOPROLOL TARTRATE 25 MG PO TABS: 25.0000 mg | ORAL_TABLET | Freq: Two times a day (BID) | ORAL | 3 refills | Status: DC

## 2022-01-28 NOTE — Telephone Encounter (Signed)
Spoke with patients wife and reviewed results and recommendations. Did conference call with scheduling to assist with getting that done. Chest xray needed prior to scan and order has been entered. Precert has been done. Patient was scheduled for 02/04/22 at 11:30 am over at the [facility]. They confirmed appointment and she had no further questions at this time.

## 2022-01-28 NOTE — Patient Instructions (Signed)
Medication Instructions:  Your physician has recommended you make the following change in your medication:   START Metoprolol 25 mg twice a day  *If you need a refill on your cardiac medications before your next appointment, please call your pharmacy*   Lab Work: CBC, BMET, BNP, and D-Dimer over at the [facility] and check in at registration.   If you have labs (blood work) drawn today and your tests are completely normal, you will receive your results only by: [place] (if you have MyChart) OR A paper copy in the mail If you have any lab test that is abnormal or we need to change your treatment, we will call you to review the results.   Testing/Procedures: None   Follow-Up: At [facility], you and your health needs are our priority.  As part of our continuing mission to provide you with exceptional heart care, we have created designated Provider Care Teams.  These Care Teams include your primary Cardiologist (physician) and Advanced Practice Providers (APPs -  Physician Assistants and Nurse Practitioners) who all work together to provide you with the care you need, when you need it.   Your next appointment:   Monday August 28 th at 2:00 PM  The format for your next appointment:   In Person  Provider:   Gerrie Nordmann NP {    Important Information About Sugar

## 2022-01-28 NOTE — Telephone Encounter (Signed)
-----   Message from Gerrie Nordmann, NP sent at 01/28/2022 11:42 AM EDT ----- Kidney function is worse. Likely a component of dehydration.  D-dimer is positive. Please see if we can get him scheduled for a VQ scan. He will be unable to have the CTA due to his kidney function. White count has jumped. He will need to follow up with his PCP for treatment soon as possible, he has a history of bacteremia

## 2022-01-28 NOTE — Telephone Encounter (Signed)
Answering machine states to call back.

## 2022-01-28 NOTE — Telephone Encounter (Signed)
Order for VQ scan has been entered.  Message has been sent to precert Call [phone] to schedule

## 2022-01-29 DIAGNOSIS — H401132 Primary open-angle glaucoma, bilateral, moderate stage: Secondary | ICD-10-CM | POA: Diagnosis not present

## 2022-01-29 LAB — HM DIABETES EYE EXAM

## 2022-02-01 ENCOUNTER — Ambulatory Visit: Payer: Medicare PPO | Attending: Cardiology | Admitting: Cardiology

## 2022-02-01 VITALS — BP 120/78 | HR 55 | Ht 71.0 in | Wt 171.4 lb

## 2022-02-01 DIAGNOSIS — I5020 Unspecified systolic (congestive) heart failure: Secondary | ICD-10-CM

## 2022-02-01 DIAGNOSIS — R001 Bradycardia, unspecified: Secondary | ICD-10-CM

## 2022-02-01 DIAGNOSIS — R000 Tachycardia, unspecified: Secondary | ICD-10-CM | POA: Diagnosis not present

## 2022-02-01 DIAGNOSIS — R7989 Other specified abnormal findings of blood chemistry: Secondary | ICD-10-CM

## 2022-02-01 DIAGNOSIS — I25118 Atherosclerotic heart disease of native coronary artery with other forms of angina pectoris: Secondary | ICD-10-CM

## 2022-02-01 DIAGNOSIS — I420 Dilated cardiomyopathy: Secondary | ICD-10-CM

## 2022-02-01 DIAGNOSIS — I10 Essential (primary) hypertension: Secondary | ICD-10-CM

## 2022-02-01 NOTE — Patient Instructions (Addendum)
Medication Instructions:  - Your physician recommends that you continue on your current medications as directed. Please refer to the Current Medication list given to you today.  *If you need a refill on your cardiac medications before your next appointment, please call your pharmacy*   Lab Work: - none ordered  If you have labs (blood work) drawn today and your tests are completely normal, you will receive your results only by: [place] (if you have MyChart) OR A paper copy in the mail If you have any lab test that is abnormal or we need to change your treatment, we will call you to review the results.   Testing/Procedures: - VQ scan as scheduled   Follow-Up: At [facility], you and your health needs are our priority.  As part of our continuing mission to provide you with exceptional heart care, we have created designated Provider Care Teams.  These Care Teams include your primary Cardiologist (physician) and Advanced Practice Providers (APPs -  Physician Assistants and Nurse Practitioners) who all work together to provide you with the care you need, when you need it.  We recommend signing up for the patient portal called "MyChart".  Sign up information is provided on this After Visit Summary.  MyChart is used to connect with patients for Virtual Visits (Telemedicine).  Patients are able to view lab/test results, encounter notes, upcoming appointments, etc.  Non-urgent messages can be sent to your provider as well.   To learn more about what you can do with MyChart, go to [email or web address].    Your next appointment:   2 month(s)  The format for your next appointment:   In Person  Provider:   You may see Ida Rogue, MD or one of the following Advanced Practice Providers on your designated Care Team:    Gerrie Nordmann, NP    Other Instructions N/a  Important Information About Sugar

## 2022-02-01 NOTE — Progress Notes (Signed)
Cardiology Clinic Note   Patient Name: Dustin Harrison Date of Encounter: 02/01/2022  Primary Care Provider:  [name], MD Primary Cardiologist:  Ida Rogue, MD  Patient Profile    88 year old male with a history of HFrEF, bradycardia, history of pheochromocytoma, right adrenal tumor status post right adrenalectomy, cochlear implant on the left in 06/2018, diabetes type 2, chronic back pain, bacteremia 10/2019 status post TEE and IV antibiotic therapy, CKD, CAD, aortic stenosis, and syncope who presents today for follow-up on his previous sinus tachycardia and coronary artery disease.  Past Medical History    Past Medical History:  Diagnosis Date   Abnormal glucose    Arthritis    Baker's cyst of knee    left   BPH (benign prostatic hypertrophy)    Cough    because of "tight" esophagus   Diabetes mellitus    Glaucoma    HOH (hard of hearing)    Hypertension    Pheochromocytoma of right adrenal gland    Stroke [facility])    Ulcer    Wears hearing aid    bilateral   Past Surgical History:  Procedure Laterality Date   adrenaletomy     CARDIAC CATHETERIZATION     CATARACT EXTRACTION W/PHACO Left 10/08/2015   Procedure: CATARACT EXTRACTION PHACO AND INTRAOCULAR LENS PLACEMENT ([place]) left eye;  Surgeon: Leandrew Koyanagi, MD;  Location: [place];  Service: Ophthalmology;  Laterality: Left;  [place]     RIGHT/LEFT HEART CATH AND CORONARY ANGIOGRAPHY Left 11/28/2019   Procedure: RIGHT/LEFT HEART CATH AND CORONARY ANGIOGRAPHY;  Surgeon: Nelva Bush, MD;  Location: [place] [facility];  Service: Cardiovascular;  Laterality: Left;   SQUAMOUS CELL CARCINOMA EXCISION  03-2006   left ear   TEE WITHOUT CARDIOVERSION N/A 10/12/2019   Procedure: TRANSESOPHAGEAL ECHOCARDIOGRAM (TEE);  Surgeon: Minna Merritts, MD;  Location: [facility] ORS;  Service: Cardiovascular;  Laterality: N/A;   TONSILLECTOMY     XI ROBOTIC LAPAROSCOPIC ASSISTED APPENDECTOMY  N/A 10/11/2019   Procedure: XI ROBOTIC ASSISTED LAPAROSCOPIC INSERTION GASTROSTOMY TUBE;  Surgeon: Jules Husbands, MD;  Location: [facility] ORS;  Service: General;  Laterality: N/A;    Allergies  No Known Allergies  History of Present Illness    88 year old male with a history of HFrEF, bradycardia, history of pheochromocytoma, right adrenal tumor status post right adrenalectomy, cochlear implant in the left of 06/2018, type 2 diabetes, chronic back pain, bacteremia in 10/2019 status post TEE and IV antibiotic therapy, CKD, CAD, aortic stenosis and syncope.  He was [facility] to [facility] on 10/09/2019 for the evaluation in the setting of positive cultures for GBS bacteremia.  Source of infection was not identified.  TEE with LVEF 25-35%, GR 1 DD, significant wall motion abnormalities with images suggestive of stress-induced CM versus LAD infarct.  TEE with no evidence of vegetation/endocarditis and moderate aortic stenosis not visualized on TTE.  On 6/21 he had noted recent syncopal episode with loss of consciousness for 10 seconds when he was evaluated in clinic.  Cardiac catheter in 11/28/2019 with severe single-vessel coronary artery disease with 90% ostial stenosis involving the small ramus intermedius that fills the right to left collaterals some mild to moderate nonobstructive CAD involving the LAD, left circumflex, RCA, upper normal to mildly elevated L/R heart filling pressures, and mild to moderate AS.  Repeat echocardiogram done 01/14/2020 with EF normalized with LVEF of 60 to 65%, no RWMA, mild LVH, RV normal size and function, trivial  MR and mild dilatation of ascending aorta 39 mm.  In 09/2020 he had had a few falls related to lightheadedness with no significant injury had a 7-day ZIO monitor that was placed and was noted to have hyperkalemia with potassium 5.9.  ZIO monitor revealed 16 runs of fast heart rate that could cause occasional palpitations and occasional early beats but overall no significant  arrhythmias or pauses.  With the discontinuation of spironolactone and as needed medications given his potassium normalized.  He was last seen in the clinic in 01/28/2022 after recent evaluation of the [facility] emergency department on 01/19/2022 from constipation.  CT of the abdomen pelvis with contrast was completed that showed a large amount of stool throughout the entire colon compatible with constipation without bowel obstruction.  There were 2 indeterminate lesions superior to the right kidney which were related to the right adrenal gland.  Recommendation for further evaluation with MRI of both of these lesions and increase in size compared to prior studies.  He was noted to be sinus tachycardia at a rate of 130 even though he was asymptomatic.  He was sent for various labs and was found to have an elevated D dimer was sent for VQ lung scan and was started on low-dose metoprolol to bring his heart rate down.  He returns to clinic today accompanied by his wife.  He has no complaints of chest pain, shortness of breath, or dyspnea on exertion, or palpitations.  He states that he has been doing well and his heart rate is normalized since starting beta-blocker therapy.  He has an appointment on 02/04/1982 for VQ lung scan as he was noted to have elevated creatinine on his previous blood work.   Home Medications    Current Outpatient Medications  Medication Sig Dispense Refill   amLODipine (NORVASC) 5 MG tablet Take 1 tablet (5 mg total) by mouth in the morning and at bedtime. 180 tablet 3   atorvastatin (LIPITOR) 20 MG tablet Take 1 tablet (20 mg total) by mouth daily. 90 tablet 3   dorzolamide-timolol (COSOPT) 22.3-6.8 MG/ML ophthalmic solution 1 drop 2 (two) times daily.     ezetimibe (ZETIA) 10 MG tablet TAKE ONE TABLET BY MOUTH EVERY DAY 90 tablet 0   finasteride (PROPECIA) 1 MG tablet Take 1 tablet (1 mg total) by mouth daily. 90 tablet 3   JARDIANCE 10 MG TABS tablet TAKE ONE TABLET (10 MG) BY MOUTH  EVERY DAY BEFORE BREAKFAST 90 tablet 3   latanoprost (XALATAN) 0.005 % ophthalmic solution Place 1 drop into both eyes at bedtime.     losartan (COZAAR) 100 MG tablet Take 1 tablet (100 mg total) by mouth daily. 90 tablet 3   metFORMIN (GLUCOPHAGE) 500 MG tablet TAKE ONE TABLET BY MOUTH TWICE DAILY WITH A MEAL 180 tablet 3   metoprolol tartrate (LOPRESSOR) 25 MG tablet Take 1 tablet (25 mg total) by mouth 2 (two) times daily. 60 tablet 3   pantoprazole (PROTONIX) 20 MG tablet TAKE 1 TABLET BY MOUTH DAILY 90 tablet 3   polyethylene glycol (MIRALAX / GLYCOLAX) 17 g packet Take 17 g by mouth daily.     tamsulosin (FLOMAX) 0.4 MG CAPS capsule TAKE 2 CAPSULES EVERY DAY 180 capsule 3   torsemide (DEMADEX) 20 MG tablet Take 1 tablet (20 mg total) by mouth daily as needed. For home weight over 185# 1 tablet 0   finasteride (PROSCAR) 5 MG tablet TAKE ONE TABLET EVERY DAY (Patient not taking: Reported on 02/01/2022) 90  tablet 3   meloxicam (MOBIC) 7.5 MG tablet SMARTSIG:1 Tablet(s) By Mouth Morning-Night (Patient not taking: Reported on 02/01/2022)     No current facility-administered medications for this visit.     Family History    Family History  Problem Relation Age of Onset   Alcohol abuse Mother    Coronary artery disease Mother    Brain cancer Father        tumor   Diabetes Brother    He indicated that the status of his mother is unknown. He indicated that the status of his father is unknown. He indicated that the status of his brother is unknown.  Social History    Social History   Socioeconomic History   Marital status: Married    Spouse name: Not on file   Number of children: Not on file   Years of education: Not on file   Highest education level: Not on file  Occupational History   Occupation: chaplain  Tobacco Use   Smoking status: Never   Smokeless tobacco: Never  Vaping Use   Vaping Use: Never used  Substance and Sexual Activity   Alcohol use: Not Currently     Alcohol/week: 1.0 standard drink of alcohol    Types: 1 Glasses of wine per week    Comment: 1 glass per week   Drug use: No   Sexual activity: Not Currently  Other Topics Concern   Not on file  Social History Narrative   Regular exercise- yes- 3-4 times a week treadmill/walk   Diet: fruits and veggies,water   Living will, HCPOA (wife) , full code (reviewed 52)   Social Determinants of Health   Financial Resource Strain: Low Risk  (06/09/2021)   Overall Financial Resource Strain (CARDIA)    Difficulty of Paying Living Expenses: Not hard at all  Food Insecurity: No Food Insecurity (06/09/2021)   Hunger Vital Sign    Worried About Running Out of Food in the Last Year: Never true    Ran Out of Food in the Last Year: Never true  Transportation Needs: No Transportation Needs (06/09/2021)   PRAPARE - Hydrologist (Medical): No    Lack of Transportation (Non-Medical): No  Physical Activity: Insufficiently Active (06/09/2021)   Exercise Vital Sign    Days of Exercise per Week: 2 days    Minutes of Exercise per Session: 60 min  Stress: No Stress Concern Present (06/09/2021)   [place]    Feeling of Stress : Not at all  Social Connections: [place] (06/09/2021)   Social Connection and Isolation Panel [NHANES]    Frequency of Communication with Friends and Family: More than three times a week    Frequency of Social Gatherings with Friends and Family: Three times a week    Attends Religious Services: More than 4 times per year    Active Member of Clubs or Organizations: Yes    Attends Archivist Meetings: More than 4 times per year    Marital Status: Married  Human resources officer Violence: Not At Risk (06/09/2021)   Humiliation, Afraid, Rape, and Kick questionnaire    Fear of Current or Ex-Partner: No    Emotionally Abused: No    Physically Abused: No    Sexually Abused: No      Review of Systems    General:  No chills, fever, night sweats or weight changes.  Cardiovascular:  No chest pain, dyspnea on exertion,  edema, orthopnea, palpitations, paroxysmal nocturnal dyspnea. Dermatological: No rash, lesions/masses Respiratory: No cough, dyspnea Urologic: No hematuria, dysuria Abdominal:   No nausea, vomiting, diarrhea, bright red blood per rectum, melena, or hematemesis Neurologic:  No visual changes, wkns, changes in mental status. All other systems reviewed and are otherwise negative except as noted above.     Physical Exam    VS:  BP 120/78 (BP Location: Left Arm, Patient Position: Sitting, Cuff Size: Normal)   Pulse (!) 55   Ht '5\' 11"'$  (1.803 m)   Wt 171 lb 6 oz (77.7 kg)   BMI 23.90 kg/m  , BMI Body mass index is 23.9 kg/m.     GEN: Well nourished, well developed, in no acute distress. HEENT: normal. Neck: Supple, no JVD, carotid bruits, or masses. Cardiac: RRR, no murmurs, rubs, or gallops. No clubbing, cyanosis, edema.  Radials/DP/PT 2+ and equal bilaterally.  Respiratory:  Respirations regular and unlabored, clear to auscultation bilaterally. GI: Soft, nontender, nondistended, BS + x 4. MS: no deformity or atrophy. Skin: warm and dry, no rash. Neuro:  Strength and sensation are intact. Psych: Normal affect.  Accessory Clinical Findings    ECG personally reviewed by me today-sinus bradycardia with a rate of 55- No acute changes  Lab Results  Component Value Date   WBC 12.7 (H) 01/28/2022   HGB 15.2 01/28/2022   HCT 46.7 01/28/2022   MCV 94.5 01/28/2022   PLT 340 01/28/2022   Lab Results  Component Value Date   CREATININE 1.82 (H) 01/28/2022   BUN 51 (H) 01/28/2022   NA 137 01/28/2022   K 4.1 01/28/2022   CL 99 01/28/2022   CO2 26 01/28/2022   Lab Results  Component Value Date   ALT 28 01/17/2022   AST 32 01/17/2022   ALKPHOS 62 01/17/2022   BILITOT 1.0 01/17/2022   Lab Results  Component Value Date   CHOL 139 12/22/2021    HDL 80.40 12/22/2021   LDLCALC 44 12/22/2021   LDLDIRECT 126.2 04/05/2013   TRIG 70.0 12/22/2021   CHOLHDL 2 12/22/2021    Lab Results  Component Value Date   HGBA1C 6.7 (A) 12/22/2021    Assessment & Plan   1.  Sinus tachycardia that is now resolved he is sinus bradycardia on EKG today.  He was started on metoprolol 25 mg twice daily and has tolerated this dosing and is back to baseline of his previous heart rates of in the 50s.  He denies any dizziness, lightheadedness or syncopal or presyncopal episodes since restarting beta-blocker therapy.  He will require close monitoring as he previously was intolerant.  He did have a positive D-dimer and his the blood work and he has been sent for chest x-ray and VQ scan per nuclear medication protocol to rule out PE.  His age-adjusted D-dimer shows VTE is unlikely.  2.  Coronary artery disease is stable angina which currently has no symptoms of angina today.  Right and left heart catheterization 11/28/2019 with severe single-vessel CAD with 90% ostial stenosis involving small ramus in the feels the right and left collaterals as well as mild to moderate nonobstructive coronary disease in the LAD, LCx, RCA, and was recommended for medical management.  There was no further work-up at this time.  He is to continue his current medication regimen.  3.  Hypertension with blood pressure today of 120/78.  Blood pressures been stable without any concerns for labile blood pressures.  Blood pressures actually improved since decreasing his heart rate  from the 130s back into the 50s where he is at his baseline.  He is currently to continue with his current medication regimen.  4.  Hyperlipidemia with LDL 44 on 12/22/2021.  He is to continue with atorvastatin and the Zetia without any other changes made to his medication today.  Previously this had been followed by his PCP.  5.  HFimpEF with last LVEF of 60-65%.  Goal-directed medical therapy is limited due to  symptoms previously of lightheadedness, syncope, dizziness, and hyperkalemia.  He is euvolemic on exam today.  He is to continue with his current medication regimen.  6.  Disposition he is return to clinic to see MD/APP in 2 months or sooner as needed.  If his VQ scan does come back positive for PE then his appointment will need to be made sooner for treatment.  Jahmai Finelli, NP 02/01/2022, 2:46 PM

## 2022-02-04 ENCOUNTER — Ambulatory Visit
Admission: RE | Admit: 2022-02-04 | Discharge: 2022-02-04 | Disposition: A | Discharge status: Home / Self Care | Payer: Medicare PPO | Source: Ambulatory Visit | Attending: Cardiology | Admitting: Cardiology

## 2022-02-04 ENCOUNTER — Encounter
Admission: RE | Admit: 2022-02-04 | Discharge: 2022-02-04 | Disposition: A | Discharge status: Home / Self Care | Payer: Medicare PPO | Source: Ambulatory Visit | Attending: Cardiology | Admitting: Cardiology

## 2022-02-04 DIAGNOSIS — R7989 Other specified abnormal findings of blood chemistry: Secondary | ICD-10-CM | POA: Insufficient documentation

## 2022-02-04 DIAGNOSIS — I2699 Other pulmonary embolism without acute cor pulmonale: Secondary | ICD-10-CM | POA: Diagnosis not present

## 2022-02-04 MED ORDER — TECHNETIUM TO 99M ALBUMIN AGGREGATED
4.0400 | Freq: Once | INTRAVENOUS | Status: AC | PRN
  Administered 2022-02-04: 4.04 via INTRAVENOUS

## 2022-02-05 ENCOUNTER — Telehealth: Payer: Self-pay

## 2022-02-05 NOTE — Progress Notes (Signed)
Please let Dustin Harrison know is chest x-ray was clear without any fluid or pneumonia noted. Thanks.

## 2022-02-05 NOTE — Progress Notes (Signed)
Please let Mr. Weldy know that his scan shows no blood clot in the lungs. Thanks.

## 2022-02-05 NOTE — Telephone Encounter (Signed)
Left message for pt to call back  °

## 2022-02-05 NOTE — Telephone Encounter (Signed)
-----   Message from Gerrie Nordmann, NP sent at 02/05/2022  9:18 AM EDT ----- Please let Mr. Mccamish know that his scan shows no blood clot in the lungs. Thanks.

## 2022-02-10 DIAGNOSIS — C4441 Basal cell carcinoma of skin of scalp and neck: Secondary | ICD-10-CM | POA: Diagnosis not present

## 2022-02-10 NOTE — Telephone Encounter (Signed)
Pt has viewed results on MyChart. 

## 2022-02-15 ENCOUNTER — Other Ambulatory Visit: Payer: Self-pay | Admitting: *Deleted

## 2022-02-15 MED ORDER — METOPROLOL SUCCINATE ER 25 MG PO TB24: 25.0000 mg | ORAL_TABLET | Freq: Every day | ORAL | 6 refills | Status: DC

## 2022-02-18 ENCOUNTER — Encounter: Payer: Self-pay | Admitting: Internal Medicine

## 2022-03-04 DIAGNOSIS — Z0100 Encounter for examination of eyes and vision without abnormal findings: Secondary | ICD-10-CM | POA: Diagnosis not present

## 2022-03-08 ENCOUNTER — Other Ambulatory Visit: Payer: Self-pay | Admitting: Cardiovascular Disease

## 2022-03-25 ENCOUNTER — Other Ambulatory Visit: Payer: Self-pay | Admitting: Internal Medicine

## 2022-03-25 ENCOUNTER — Other Ambulatory Visit: Payer: Self-pay | Admitting: Cardiovascular Disease

## 2022-03-25 DIAGNOSIS — I5020 Unspecified systolic (congestive) heart failure: Secondary | ICD-10-CM

## 2022-04-07 ENCOUNTER — Ambulatory Visit: Payer: Medicare PPO | Admitting: Cardiology

## 2022-04-21 ENCOUNTER — Ambulatory Visit: Payer: Medicare PPO | Admitting: Cardiology

## 2022-04-26 ENCOUNTER — Encounter: Payer: Self-pay | Admitting: Cardiology

## 2022-04-26 ENCOUNTER — Other Ambulatory Visit
Admission: RE | Admit: 2022-04-26 | Discharge: 2022-04-26 | Disposition: A | Discharge status: Home / Self Care | Payer: Medicare PPO | Source: Ambulatory Visit | Attending: Cardiology | Admitting: Cardiology

## 2022-04-26 ENCOUNTER — Ambulatory Visit: Payer: Medicare PPO | Attending: Cardiology | Admitting: Cardiology

## 2022-04-26 VITALS — BP 112/60 | HR 62 | Ht 71.0 in | Wt 174.0 lb

## 2022-04-26 DIAGNOSIS — I25118 Atherosclerotic heart disease of native coronary artery with other forms of angina pectoris: Secondary | ICD-10-CM

## 2022-04-26 DIAGNOSIS — I5020 Unspecified systolic (congestive) heart failure: Secondary | ICD-10-CM | POA: Diagnosis not present

## 2022-04-26 DIAGNOSIS — I10 Essential (primary) hypertension: Secondary | ICD-10-CM

## 2022-04-26 DIAGNOSIS — E785 Hyperlipidemia, unspecified: Secondary | ICD-10-CM | POA: Diagnosis not present

## 2022-04-26 LAB — BASIC METABOLIC PANEL
Anion gap: 11 (ref 5–15)
BUN: 53 mg/dL — ABNORMAL HIGH (ref 8–23)
CO2: 27 mmol/L (ref 22–32)
Calcium: 9.2 mg/dL (ref 8.9–10.3)
Chloride: 100 mmol/L (ref 98–111)
Creatinine, Ser: 1.77 mg/dL — ABNORMAL HIGH (ref 0.61–1.24)
GFR, Estimated: 36 mL/min — ABNORMAL LOW (ref >60)
Glucose, Bld: 134 mg/dL — ABNORMAL HIGH (ref 70–99)
Potassium: 5 mmol/L (ref 3.5–5.1)
Sodium: 138 mmol/L (ref 135–145)

## 2022-04-26 MED ORDER — TORSEMIDE 20 MG PO TABS: 20.0000 mg | ORAL_TABLET | ORAL | 0 refills | Status: DC

## 2022-04-26 NOTE — Patient Instructions (Signed)
Medication Instructions:  Your physician has recommended you make the following change in your medication:   Take Torsemide 20 mg Monday Wednesday Friday and extra pill as needed for swelling.   *If you need a refill on your cardiac medications before your next appointment, please call your pharmacy*   Lab Work: BMP today over at the [facility] and check in at registration.   If you have labs (blood work) drawn today and your tests are completely normal, you will receive your results only by: [place] (if you have MyChart) OR A paper copy in the mail If you have any lab test that is abnormal or we need to change your treatment, we will call you to review the results.   Testing/Procedures: None   Follow-Up: At [facility], you and your health needs are our priority.  As part of our continuing mission to provide you with exceptional heart care, we have created designated Provider Care Teams.  These Care Teams include your primary Cardiologist (physician) and Advanced Practice Providers (APPs -  Physician Assistants and Nurse Practitioners) who all work together to provide you with the care you need, when you need it.   Your next appointment:   3 week(s)  The format for your next appointment:   In Person  Provider:   Ida Rogue, MD      Important Information About Sugar

## 2022-04-26 NOTE — Progress Notes (Signed)
Cardiology Clinic Note   Patient Name: Dustin Harrison Date of Encounter: 04/26/2022  Primary Care Provider:  [name], MD Primary Cardiologist:  Ida Rogue, MD  Patient Profile    88 year old male with a history of HFrEF, bradycardia, history of pheochromocytoma, right adrenal tumor status post right adrenalectomy, cochlear implant left 06/2018, type 2 diabetes, chronic back pain, back 08/2019 status post TEE IV antibiotic therapy, CKD, CAD, aortic stenosis, and syncope who presents today for follow-up of his coronary artery disease.  Past Medical History    Past Medical History:  Diagnosis Date   Abnormal glucose    Arthritis    Baker's cyst of knee    left   BPH (benign prostatic hypertrophy)    Cough    because of "tight" esophagus   Diabetes mellitus    Glaucoma    HOH (hard of hearing)    Hypertension    Pheochromocytoma of right adrenal gland    Stroke [facility])    Ulcer    Wears hearing aid    bilateral   Past Surgical History:  Procedure Laterality Date   adrenaletomy     CARDIAC CATHETERIZATION     CATARACT EXTRACTION W/PHACO Left 10/08/2015   Procedure: CATARACT EXTRACTION PHACO AND INTRAOCULAR LENS PLACEMENT ([place]) left eye;  Surgeon: Leandrew Koyanagi, MD;  Location: [facility];  Service: Ophthalmology;  Laterality: Left;  [place]     RIGHT/LEFT HEART CATH AND CORONARY ANGIOGRAPHY Left 11/28/2019   Procedure: RIGHT/LEFT HEART CATH AND CORONARY ANGIOGRAPHY;  Surgeon: Nelva Bush, MD;  Location: [place] [facility];  Service: Cardiovascular;  Laterality: Left;   SQUAMOUS CELL CARCINOMA EXCISION  03-2006   left ear   TEE WITHOUT CARDIOVERSION N/A 10/12/2019   Procedure: TRANSESOPHAGEAL ECHOCARDIOGRAM (TEE);  Surgeon: Minna Merritts, MD;  Location: [facility];  Service: Cardiovascular;  Laterality: N/A;   TONSILLECTOMY     XI ROBOTIC LAPAROSCOPIC ASSISTED APPENDECTOMY N/A 10/11/2019   Procedure: XI ROBOTIC ASSISTED  LAPAROSCOPIC INSERTION GASTROSTOMY TUBE;  Surgeon: Jules Husbands, MD;  Location: [facility];  Service: General;  Laterality: N/A;    Allergies  No Known Allergies  History of Present Illness    Dustin Harrison is an 88 year old male with a history of HFrEF, bradycardia, history of pheochromocytoma [place], right adrenal tumor status post right adrenalectomy, cochlear implant in the left on 06/2018, type 2 diabetes, chronic back pain, bacteremia 10/2019 status post TEE with IV antibiotic therapy, CKD, coronary artery disease, aortic stenosis, and syncope.  In 10/2019 had a TEE with LVEF of 25-35%, G1 DD, significant wall motion abnormalities with images suggestive of stress-induced cardiomyopathy versus LAD infarct.  TEE with no evidence of vegetation/endocarditis and moderate aortic stenosis not visualized on TTE.  He underwent cardiac catheterization 11/28/2019 with severe single-vessel coronary artery disease 90% ostial stenosis of a small ramus intermedius that feels with right to left collaterals some mild to moderate nonobstructive CAD involving the LAD, left circumflex, RCA, upper normal to mildly elevated L/R heart filling pressures and mild to moderate aortic stenosis.  Repeat echocardiogram done 01/14/2020 revealed EF had normalized with LVEF of 60-65%, no regional wall motion abnormalities, mild LVH, RV with normal size and function, trivial MR and mild dilatation of the ascending aorta at 39 mm.  He was last seen in clinic 02/01/2022.  At that time he was having no complaints of chest discomfort, shortness of breath, dyspnea on exertion, or palpitations.  Heart rate  had normalized since starting on beta-blocker therapy.  Did have elevated D-dimer and was to have a VQ scan per nuclear medicine protocol to rule out PE.  He returns clinic today stating that he has been doing fairly well.  He has noted some swelling and occasional shortness of breath on exertion but overall he states that he has been  doing fairly well.  He denies any chest pain, dizziness, lightheadedness, near syncope.  Denies any recent hospitalizations or visits to the emergency department.  Also states that he has a follow-up appointment with his PCP this afternoon at 330.  Home Medications    Current Outpatient Medications  Medication Sig Dispense Refill   amLODipine (NORVASC) 5 MG tablet TAKE ONE TABLET BY MOUTH TWICE DAILY 180 tablet 0   atorvastatin (LIPITOR) 20 MG tablet TAKE ONE TABLET BY MOUTH EVERY DAY 90 tablet 3   dorzolamide-timolol (COSOPT) 22.3-6.8 MG/ML ophthalmic solution 1 drop 2 (two) times daily.     ezetimibe (ZETIA) 10 MG tablet TAKE ONE TABLET BY MOUTH EVERY DAY 90 tablet 0   finasteride (PROPECIA) 1 MG tablet Take 1 tablet (1 mg total) by mouth daily. 90 tablet 3   JARDIANCE 10 MG TABS tablet TAKE ONE TABLET (10 MG) BY MOUTH EVERY DAY BEFORE BREAKFAST 90 tablet 3   latanoprost (XALATAN) 0.005 % ophthalmic solution Place 1 drop into both eyes at bedtime.     losartan (COZAAR) 100 MG tablet TAKE ONE TABLET BY MOUTH EVERY DAY 90 tablet 3   metFORMIN (GLUCOPHAGE) 500 MG tablet TAKE ONE TABLET BY MOUTH TWICE DAILY WITH A MEAL 180 tablet 3   metoprolol succinate (TOPROL-XL) 25 MG 24 hr tablet Take 1 tablet (25 mg total) by mouth daily. 30 tablet 6   pantoprazole (PROTONIX) 20 MG tablet TAKE 1 TABLET BY MOUTH DAILY 90 tablet 3   polyethylene glycol (MIRALAX / GLYCOLAX) 17 g packet Take 17 g by mouth daily.     tamsulosin (FLOMAX) 0.4 MG CAPS capsule TAKE 2 CAPSULES EVERY DAY 180 capsule 3   finasteride (PROSCAR) 5 MG tablet TAKE ONE TABLET EVERY DAY (Patient not taking: Reported on 04/26/2022) 90 tablet 3   meloxicam (MOBIC) 7.5 MG tablet SMARTSIG:1 Tablet(s) By Mouth Morning-Night (Patient not taking: Reported on 04/26/2022)     torsemide (DEMADEX) 20 MG tablet Take 1 tablet (20 mg total) by mouth as directed. Take 1 tablet on Monday Wednesday Friday with extra 1 tablet for swelling. 1 tablet 0   No  current facility-administered medications for this visit.     Family History    Family History  Problem Relation Age of Onset   Alcohol abuse Mother    Coronary artery disease Mother    Brain cancer Father        tumor   Diabetes Brother    He indicated that the status of his mother is unknown. He indicated that the status of his father is unknown. He indicated that the status of his brother is unknown.  Social History    Social History   Socioeconomic History   Marital status: Married    Spouse name: Not on file   Number of children: Not on file   Years of education: Not on file   Highest education level: Not on file  Occupational History   Occupation: chaplain  Tobacco Use   Smoking status: Never   Smokeless tobacco: Never  Vaping Use   Vaping Use: Never used  Substance and Sexual Activity   Alcohol  use: Not Currently    Alcohol/week: 1.0 standard drink of alcohol    Types: 1 Glasses of wine per week    Comment: 1 glass per week   Drug use: No   Sexual activity: Not Currently  Other Topics Concern   Not on file  Social History Narrative   Regular exercise- yes- 3-4 times a week treadmill/walk   Diet: fruits and veggies,water   Living will, HCPOA (wife) , full code (reviewed 66)   Social Determinants of Health   Financial Resource Strain: Low Risk  (06/09/2021)   Overall Financial Resource Strain (CARDIA)    Difficulty of Paying Living Expenses: Not hard at all  Food Insecurity: No Food Insecurity (06/09/2021)   Hunger Vital Sign    Worried About Running Out of Food in the Last Year: Never true    Ran Out of Food in the Last Year: Never true  Transportation Needs: No Transportation Needs (06/09/2021)   PRAPARE - Hydrologist (Medical): No    Lack of Transportation (Non-Medical): No  Physical Activity: Insufficiently Active (06/09/2021)   Exercise Vital Sign    Days of Exercise per Week: 2 days    Minutes of Exercise per Session: 60  min  Stress: No Stress Concern Present (06/09/2021)   [place]    Feeling of Stress : Not at all  Social Connections: [place] (06/09/2021)   Social Connection and Isolation Panel [NHANES]    Frequency of Communication with Friends and Family: More than three times a week    Frequency of Social Gatherings with Friends and Family: Three times a week    Attends Religious Services: More than 4 times per year    Active Member of Clubs or Organizations: Yes    Attends Archivist Meetings: More than 4 times per year    Marital Status: Married  Human resources officer Violence: Not At Risk (06/09/2021)   Humiliation, Afraid, Rape, and Kick questionnaire    Fear of Current or Ex-Partner: No    Emotionally Abused: No    Physically Abused: No    Sexually Abused: No     Review of Systems    General:  No chills, fever, night sweats or weight changes.  Endorses fatigue Cardiovascular:  No chest pain, endorses chronic dyspnea on exertion, endorses chronic peripheral edema, orthopnea, palpitations, paroxysmal nocturnal dyspnea. Dermatological: No rash, lesions/masses Respiratory: No cough, endorses chronic exertional dyspnea Urologic: No hematuria, dysuria Abdominal:   No nausea, vomiting, diarrhea, bright red blood per rectum, melena, or hematemesis Neurologic:  No visual changes, wkns, changes in mental status. All other systems reviewed and are otherwise negative except as noted above.   Physical Exam    VS:  BP 112/60 (BP Location: Left Arm, Patient Position: Sitting, Cuff Size: Normal)   Pulse 62   Ht '5\' 11"'$  (1.803 m)   Wt 174 lb (78.9 kg)   SpO2 98%   BMI 24.27 kg/m  , BMI Body mass index is 24.27 kg/m.     GEN: Well nourished, well developed, in no acute distress. HEENT: normal.  Glasses on Neck: Supple, no JVD, carotid bruits, or masses. Cardiac: RRR, no murmurs, rubs, or gallops. No clubbing, cyanosis,  1+ edema.  Radials/DP/PT 2+ and equal bilaterally.  Respiratory:  Respirations regular and unlabored, coarse to auscultation bilaterally with some clearing with cough. GI: Soft, nontender, nondistended, BS + x 4. MS: no deformity or atrophy. Skin: warm  and dry, no rash. Neuro:  Strength and sensation are intact. Psych: Normal affect.  Accessory Clinical Findings    ECG personally reviewed by me today-sinus rhythm with a rate of 62 with a left axis deviation- No acute changes  Lab Results  Component Value Date   WBC 12.7 (H) 01/28/2022   HGB 15.2 01/28/2022   HCT 46.7 01/28/2022   MCV 94.5 01/28/2022   PLT 340 01/28/2022   Lab Results  Component Value Date   CREATININE 1.82 (H) 01/28/2022   BUN 51 (H) 01/28/2022   NA 137 01/28/2022   K 4.1 01/28/2022   CL 99 01/28/2022   CO2 26 01/28/2022   Lab Results  Component Value Date   ALT 28 01/17/2022   AST 32 01/17/2022   ALKPHOS 62 01/17/2022   BILITOT 1.0 01/17/2022   Lab Results  Component Value Date   CHOL 139 12/22/2021   HDL 80.40 12/22/2021   LDLCALC 44 12/22/2021   LDLDIRECT 126.2 04/05/2013   TRIG 70.0 12/22/2021   CHOLHDL 2 12/22/2021    Lab Results  Component Value Date   HGBA1C 6.7 (A) 12/22/2021    Assessment & Plan   1.  Coronary artery disease with stable angina and currently has no anginal symptoms today.  Right and left heart catheterization 11/28/2019 with severe single-vessel CAD with 90% ostial stenosis involving small ramus.  He was recommended for medical management.  There is no further work-up at this time he is continued on his current medication regimen.  2.  Hypertension with blood pressure today of 112/60.  Blood pressures have been stable without concerns for labile blood pressures.  He is continued on amlodipine 5 mg twice daily and losartan 100 mg daily and Toprol-XL 25 mg daily as well as torsemide 20 mg 3 times a week primarily for weight over 185 pounds.  3.  Hyperlipidemia with an LDL  44 on 12/22/2021.  He is continued on atorvastatin and Zetia without any changes made to his medication today.  This is followed by his PCP.  4.  HFimpEF with a last LVEF of 60 to 65%.  Goal-directed medical therapy is limited due to symptoms previously of lightheadedness, syncope, dizziness, and hyperkalemia.  He is slightly volume up today with coarse lung sounds and slight peripheral edema.  He does have some clearing with coughing.  Stated that he has not had his fluid medication today.  He has been sent for BMP today and advised we may need to increase his torsemide depending on his kidney function from the 3-4 times daily depending on his weight, shortness of breath, and peripheral edema.  5.  Disposition he is return to clinic to see MD/APP in 3 months or sooner if needed.  We have also advised the blood work will be able to be evaluated by his PCP who he has an appointment with today.  Shania Bjelland, NP 04/26/2022, 2:19 PM

## 2022-05-07 ENCOUNTER — Ambulatory Visit: Payer: Medicare PPO | Admitting: Internal Medicine

## 2022-05-07 ENCOUNTER — Encounter: Payer: Self-pay | Admitting: Internal Medicine

## 2022-05-07 ENCOUNTER — Ambulatory Visit (INDEPENDENT_AMBULATORY_CARE_PROVIDER_SITE_OTHER)
Admission: RE | Admit: 2022-05-07 | Discharge: 2022-05-07 | Disposition: A | Discharge status: Home / Self Care | Payer: Medicare PPO | Source: Ambulatory Visit | Attending: Internal Medicine | Admitting: Internal Medicine

## 2022-05-07 VITALS — BP 138/80 | HR 90 | Temp 97.9°F | Ht 71.0 in | Wt 176.0 lb

## 2022-05-07 DIAGNOSIS — J181 Lobar pneumonia, unspecified organism: Secondary | ICD-10-CM | POA: Insufficient documentation

## 2022-05-07 DIAGNOSIS — R059 Cough, unspecified: Secondary | ICD-10-CM | POA: Diagnosis not present

## 2022-05-07 DIAGNOSIS — R053 Chronic cough: Secondary | ICD-10-CM

## 2022-05-07 DIAGNOSIS — I5020 Unspecified systolic (congestive) heart failure: Secondary | ICD-10-CM

## 2022-05-07 MED ORDER — BENZONATATE 200 MG PO CAPS: 200.0000 mg | ORAL_CAPSULE | Freq: Three times a day (TID) | ORAL | 0 refills | Status: DC | PRN

## 2022-05-07 MED ORDER — LEVOFLOXACIN 500 MG PO TABS: 500.0000 mg | ORAL_TABLET | Freq: Every day | ORAL | 0 refills | Status: DC

## 2022-05-07 NOTE — Assessment & Plan Note (Signed)
Doesn't seem to be exacerbated Can continue torsemide 20 three days per week unless his weight goes up

## 2022-05-07 NOTE — Assessment & Plan Note (Signed)
CXR shows LLL pneumonia Will treat with levaquin '500mg'$  daily for 7 days (tendon precautions mentioned) Benzonatate prn ER if worsens Will plan repeat CXR when back in January

## 2022-05-07 NOTE — Assessment & Plan Note (Signed)
Likely infectious--but nothing to suggest pneumonia Will check CXR

## 2022-05-07 NOTE — Progress Notes (Signed)
Subjective:    Patient ID: Dustin Harrison, male    DOB: 13-Jan-1932, 88 y.o.   MRN: [id number]  HPI Here with wife due to respiratory infection  Having bad cough--goes back 3 weeks No sputum No fever, chills or sweats No SOB No headache, sore throat  Some rhinorrhea ---better with nasal wash  Wife notes foot swelling Uses the torsemide three days a week Weight is stable  Tried coricidin---some help (inconsistent)  Current Outpatient Medications on File Prior to Visit  Medication Sig Dispense Refill   amLODipine (NORVASC) 5 MG tablet TAKE ONE TABLET BY MOUTH TWICE DAILY 180 tablet 0   atorvastatin (LIPITOR) 20 MG tablet TAKE ONE TABLET BY MOUTH EVERY DAY 90 tablet 3   dorzolamide-timolol (COSOPT) 22.3-6.8 MG/ML ophthalmic solution 1 drop 2 (two) times daily.     ezetimibe (ZETIA) 10 MG tablet TAKE ONE TABLET BY MOUTH EVERY DAY 90 tablet 0   finasteride (PROPECIA) 1 MG tablet Take 1 tablet (1 mg total) by mouth daily. 90 tablet 3   JARDIANCE 10 MG TABS tablet TAKE ONE TABLET (10 MG) BY MOUTH EVERY DAY BEFORE BREAKFAST 90 tablet 3   latanoprost (XALATAN) 0.005 % ophthalmic solution Place 1 drop into both eyes at bedtime.     losartan (COZAAR) 100 MG tablet TAKE ONE TABLET BY MOUTH EVERY DAY 90 tablet 3   meloxicam (MOBIC) 7.5 MG tablet      metFORMIN (GLUCOPHAGE) 500 MG tablet TAKE ONE TABLET BY MOUTH TWICE DAILY WITH A MEAL 180 tablet 3   metoprolol succinate (TOPROL-XL) 25 MG 24 hr tablet Take 1 tablet (25 mg total) by mouth daily. 30 tablet 6   pantoprazole (PROTONIX) 20 MG tablet TAKE 1 TABLET BY MOUTH DAILY 90 tablet 3   polyethylene glycol (MIRALAX / GLYCOLAX) 17 g packet Take 17 g by mouth daily.     tamsulosin (FLOMAX) 0.4 MG CAPS capsule TAKE 2 CAPSULES EVERY DAY 180 capsule 3   torsemide (DEMADEX) 20 MG tablet Take 1 tablet (20 mg total) by mouth as directed. Take 1 tablet on Monday Wednesday Friday with extra 1 tablet for swelling. 1 tablet 0   No current  facility-administered medications on file prior to visit.    No Known Allergies  Past Medical History:  Diagnosis Date   Abnormal glucose    Arthritis    Baker's cyst of knee    left   BPH (benign prostatic hypertrophy)    Cough    because of "tight" esophagus   Diabetes mellitus    Glaucoma    HOH (hard of hearing)    Hypertension    Pheochromocytoma of right adrenal gland    Stroke [facility])    Ulcer    Wears hearing aid    bilateral    Past Surgical History:  Procedure Laterality Date   adrenaletomy     CARDIAC CATHETERIZATION     CATARACT EXTRACTION W/PHACO Left 10/08/2015   Procedure: CATARACT EXTRACTION PHACO AND INTRAOCULAR LENS PLACEMENT ([place]) left eye;  Surgeon: Leandrew Koyanagi, MD;  Location: [place];  Service: Ophthalmology;  Laterality: Left;  [place]     RIGHT/LEFT HEART CATH AND CORONARY ANGIOGRAPHY Left 11/28/2019   Procedure: RIGHT/LEFT HEART CATH AND CORONARY ANGIOGRAPHY;  Surgeon: Nelva Bush, MD;  Location: [place] [facility];  Service: Cardiovascular;  Laterality: Left;   SQUAMOUS CELL CARCINOMA EXCISION  03-2006   left ear   TEE WITHOUT CARDIOVERSION N/A 10/12/2019   Procedure: TRANSESOPHAGEAL  ECHOCARDIOGRAM (TEE);  Surgeon: Minna Merritts, MD;  Location: [facility];  Service: Cardiovascular;  Laterality: N/A;   TONSILLECTOMY     XI ROBOTIC LAPAROSCOPIC ASSISTED APPENDECTOMY N/A 10/11/2019   Procedure: XI ROBOTIC ASSISTED LAPAROSCOPIC INSERTION GASTROSTOMY TUBE;  Surgeon: Jules Husbands, MD;  Location: [facility];  Service: General;  Laterality: N/A;    Family History  Problem Relation Age of Onset   Alcohol abuse Mother    Coronary artery disease Mother    Brain cancer Father        tumor   Diabetes Brother     Social History   Socioeconomic History   Marital status: Married    Spouse name: Not on file   Number of children: Not on file   Years of education: Not on file   Highest education level: Not on file   Occupational History   Occupation: chaplain  Tobacco Use   Smoking status: Never    Passive exposure: Never   Smokeless tobacco: Never  Vaping Use   Vaping Use: Never used  Substance and Sexual Activity   Alcohol use: Not Currently    Alcohol/week: 1.0 standard drink of alcohol    Types: 1 Glasses of wine per week    Comment: 1 glass per week   Drug use: No   Sexual activity: Not Currently  Other Topics Concern   Not on file  Social History Narrative   Regular exercise- yes- 3-4 times a week treadmill/walk   Diet: fruits and veggies,water   Living will, HCPOA (wife) , full code (reviewed 31)   Social Determinants of Health   Financial Resource Strain: Low Risk  (06/09/2021)   Overall Financial Resource Strain (CARDIA)    Difficulty of Paying Living Expenses: Not hard at all  Food Insecurity: No Food Insecurity (06/09/2021)   Hunger Vital Sign    Worried About Running Out of Food in the Last Year: Never true    [place] in the Last Year: Never true  Transportation Needs: No Transportation Needs (06/09/2021)   PRAPARE - Hydrologist (Medical): No    Lack of Transportation (Non-Medical): No  Physical Activity: Insufficiently Active (06/09/2021)   Exercise Vital Sign    Days of Exercise per Week: 2 days    Minutes of Exercise per Session: 60 min  Stress: No Stress Concern Present (06/09/2021)   [place]    Feeling of Stress : Not at all  Social Connections: [place] (06/09/2021)   Social Connection and Isolation Panel [NHANES]    Frequency of Communication with Friends and Family: More than three times a week    Frequency of Social Gatherings with Friends and Family: Three times a week    Attends Religious Services: More than 4 times per year    Active Member of Clubs or Organizations: Yes    Attends Archivist Meetings: More than 4 times per year    Marital  Status: Married  Human resources officer Violence: Not At Risk (06/09/2021)   Humiliation, Afraid, Rape, and Kick questionnaire    Fear of Current or Ex-Partner: No    Emotionally Abused: No    Physically Abused: No    Sexually Abused: No   Review of Systems Sleeps in bed--fairly flat. No PND No chest pain No palpitations    Objective:   Physical Exam Constitutional:      Appearance: Normal appearance.  Comments: Intermittent harsh cough  HENT:     Mouth/Throat:     Pharynx: No oropharyngeal exudate or posterior oropharyngeal erythema.  Cardiovascular:     Rate and Rhythm: Normal rate and regular rhythm.     Heart sounds:     No gallop.     Comments: Gr 3/6 systolic murmur across precodium Pulmonary:     Effort: Pulmonary effort is normal.     Breath sounds: Normal breath sounds. No wheezing or rales.  Musculoskeletal:     Cervical back: Neck supple.  Lymphadenopathy:     Cervical: No cervical adenopathy.  Neurological:     Mental Status: He is alert.            Assessment & Plan:

## 2022-05-13 ENCOUNTER — Encounter: Payer: Self-pay | Admitting: Internal Medicine

## 2022-05-13 ENCOUNTER — Ambulatory Visit: Payer: Medicare PPO | Admitting: Internal Medicine

## 2022-05-13 VITALS — BP 136/88 | HR 64 | Temp 97.4°F | Ht 71.0 in | Wt 176.0 lb

## 2022-05-13 DIAGNOSIS — J181 Lobar pneumonia, unspecified organism: Secondary | ICD-10-CM | POA: Diagnosis not present

## 2022-05-13 MED ORDER — HYDROCODONE BIT-HOMATROP MBR 5-1.5 MG/5ML PO SOLN: 5.0000 mL | Freq: Every evening | ORAL | 0 refills | Status: DC | PRN

## 2022-05-13 NOTE — Progress Notes (Signed)
Subjective:    Patient ID: Dustin Harrison, male    DOB: 23-Feb-1932, 88 y.o.   MRN: [id number]  HPI Here due to ongoing cough  No problems with the antibiotic Still has the cough--not really any better Some SOB--but only with the cough No fever, shakes or chills  Benzonatate does help some  Rare sputum  Weight stable Trace edema  Current Outpatient Medications on File Prior to Visit  Medication Sig Dispense Refill   amLODipine (NORVASC) 5 MG tablet TAKE ONE TABLET BY MOUTH TWICE DAILY 180 tablet 0   atorvastatin (LIPITOR) 20 MG tablet TAKE ONE TABLET BY MOUTH EVERY DAY 90 tablet 3   benzonatate (TESSALON) 200 MG capsule Take 1 capsule (200 mg total) by mouth 3 (three) times daily as needed for cough. 60 capsule 0   dorzolamide-timolol (COSOPT) 22.3-6.8 MG/ML ophthalmic solution 1 drop 2 (two) times daily.     ezetimibe (ZETIA) 10 MG tablet TAKE ONE TABLET BY MOUTH EVERY DAY 90 tablet 0   finasteride (PROPECIA) 1 MG tablet Take 1 tablet (1 mg total) by mouth daily. 90 tablet 3   JARDIANCE 10 MG TABS tablet TAKE ONE TABLET (10 MG) BY MOUTH EVERY DAY BEFORE BREAKFAST 90 tablet 3   latanoprost (XALATAN) 0.005 % ophthalmic solution Place 1 drop into both eyes at bedtime.     levofloxacin (LEVAQUIN) 500 MG tablet Take 1 tablet (500 mg total) by mouth daily. 7 tablet 0   losartan (COZAAR) 100 MG tablet TAKE ONE TABLET BY MOUTH EVERY DAY 90 tablet 3   meloxicam (MOBIC) 7.5 MG tablet      metFORMIN (GLUCOPHAGE) 500 MG tablet TAKE ONE TABLET BY MOUTH TWICE DAILY WITH A MEAL 180 tablet 3   metoprolol succinate (TOPROL-XL) 25 MG 24 hr tablet Take 1 tablet (25 mg total) by mouth daily. 30 tablet 6   pantoprazole (PROTONIX) 20 MG tablet TAKE 1 TABLET BY MOUTH DAILY 90 tablet 3   polyethylene glycol (MIRALAX / GLYCOLAX) 17 g packet Take 17 g by mouth daily.     tamsulosin (FLOMAX) 0.4 MG CAPS capsule TAKE 2 CAPSULES EVERY DAY 180 capsule 3   torsemide (DEMADEX) 20 MG tablet Take 1 tablet (20  mg total) by mouth as directed. Take 1 tablet on Monday Wednesday Friday with extra 1 tablet for swelling. 1 tablet 0   No current facility-administered medications on file prior to visit.    No Known Allergies  Past Medical History:  Diagnosis Date   Abnormal glucose    Arthritis    Baker's cyst of knee    left   BPH (benign prostatic hypertrophy)    Cough    because of "tight" esophagus   Diabetes mellitus    Glaucoma    HOH (hard of hearing)    Hypertension    Pheochromocytoma of right adrenal gland    Stroke [facility])    Ulcer    Wears hearing aid    bilateral    Past Surgical History:  Procedure Laterality Date   adrenaletomy     CARDIAC CATHETERIZATION     CATARACT EXTRACTION W/PHACO Left 10/08/2015   Procedure: CATARACT EXTRACTION PHACO AND INTRAOCULAR LENS PLACEMENT ([place]) left eye;  Surgeon: Leandrew Koyanagi, MD;  Location: [place];  Service: Ophthalmology;  Laterality: Left;  [place]     RIGHT/LEFT HEART CATH AND CORONARY ANGIOGRAPHY Left 11/28/2019   Procedure: RIGHT/LEFT HEART CATH AND CORONARY ANGIOGRAPHY;  Surgeon: Nelva Bush, MD;  Location:  [place] [facility];  Service: Cardiovascular;  Laterality: Left;   SQUAMOUS CELL CARCINOMA EXCISION  03-2006   left ear   TEE WITHOUT CARDIOVERSION N/A 10/12/2019   Procedure: TRANSESOPHAGEAL ECHOCARDIOGRAM (TEE);  Surgeon: Minna Merritts, MD;  Location: [facility];  Service: Cardiovascular;  Laterality: N/A;   TONSILLECTOMY     XI ROBOTIC LAPAROSCOPIC ASSISTED APPENDECTOMY N/A 10/11/2019   Procedure: XI ROBOTIC ASSISTED LAPAROSCOPIC INSERTION GASTROSTOMY TUBE;  Surgeon: Jules Husbands, MD;  Location: [facility];  Service: General;  Laterality: N/A;    Family History  Problem Relation Age of Onset   Alcohol abuse Mother    Coronary artery disease Mother    Brain cancer Father        tumor   Diabetes Brother     Social History   Socioeconomic History   Marital status: Married     Spouse name: Not on file   Number of children: Not on file   Years of education: Not on file   Highest education level: Not on file  Occupational History   Occupation: chaplain  Tobacco Use   Smoking status: Never    Passive exposure: Never   Smokeless tobacco: Never  Vaping Use   Vaping Use: Never used  Substance and Sexual Activity   Alcohol use: Not Currently    Alcohol/week: 1.0 standard drink of alcohol    Types: 1 Glasses of wine per week    Comment: 1 glass per week   Drug use: No   Sexual activity: Not Currently  Other Topics Concern   Not on file  Social History Narrative   Regular exercise- yes- 3-4 times a week treadmill/walk   Diet: fruits and veggies,water   Living will, HCPOA (wife) , full code (reviewed 20)   Social Determinants of Health   Financial Resource Strain: Low Risk  (06/09/2021)   Overall Financial Resource Strain (CARDIA)    Difficulty of Paying Living Expenses: Not hard at all  Food Insecurity: No Food Insecurity (06/09/2021)   Hunger Vital Sign    Worried About Running Out of Food in the Last Year: Never true    [place] in the Last Year: Never true  Transportation Needs: No Transportation Needs (06/09/2021)   PRAPARE - Hydrologist (Medical): No    Lack of Transportation (Non-Medical): No  Physical Activity: Insufficiently Active (06/09/2021)   Exercise Vital Sign    Days of Exercise per Week: 2 days    Minutes of Exercise per Session: 60 min  Stress: No Stress Concern Present (06/09/2021)   [place]    Feeling of Stress : Not at all  Social Connections: [place] (06/09/2021)   Social Connection and Isolation Panel [NHANES]    Frequency of Communication with Friends and Family: More than three times a week    Frequency of Social Gatherings with Friends and Family: Three times a week    Attends Religious Services: More than 4 times per  year    Active Member of Clubs or Organizations: Yes    Attends Archivist Meetings: More than 4 times per year    Marital Status: Married  Human resources officer Violence: Not At Risk (06/09/2021)   Humiliation, Afraid, Rape, and Kick questionnaire    Fear of Current or Ex-Partner: No    Emotionally Abused: No    Physically Abused: No    Sexually Abused: No   Review of  Systems Eating well Sleeping okay--but keeping him up some    Objective:   Physical Exam Constitutional:      Appearance: Normal appearance.     Comments: Occ dry cough  Cardiovascular:     Rate and Rhythm: Normal rate and regular rhythm.     Heart sounds:     No gallop.     Comments: Soft systolic murmur Pulmonary:     Effort: Pulmonary effort is normal.     Breath sounds: Normal breath sounds. No wheezing or rales.  Musculoskeletal:     Comments: No sig edema  Neurological:     Mental Status: He is alert.            Assessment & Plan:

## 2022-05-13 NOTE — Assessment & Plan Note (Signed)
Was never systemically sick Will finish out the levaquin today If no better next week, would try empiric z-pak Will give hycodan for sleep--continue benzonatate in day

## 2022-05-16 NOTE — Progress Notes (Deleted)
Date:  05/16/2022   ID:  Dustin Harrison, DOB [birth year], MRN [id number]  Patient Location:  [address] [place] [address]   Provider location:   [facility], [place] office  PCP:  [name], MD  Cardiologist:  [facility]   No chief complaint on file.   History of Present Illness:    Dustin Harrison is a 88 y.o. male past medical history of  HFrEF, bradycardia, h/o pheochromocytoma, R adrenal tumor s/p R adrenalectomy, cochlear implant on left 06/2018,  Dm2,  chronic back pain,  bacteremia 10/2019 s/p TEE and IV abx, LVEF 25-35% CKD,  CAD,  aortic sclerosis  syncope  Echo 01/2020: Ef 60% Who presents for f/u of his cardiomyopathy, syncope,  CAD, aortic valve sclerosis  Last seen by myself in clinic August 2022 Seen by one of our providers November 2023   In follow-up today, reports doing well Has recovered from sepsis in 2021  Active at baseline, swimming, walking Lives at [place]  Not taking torsemide, reports having stable ankle edema Labs reviewed A1c 6.3 CR 1.58  EKG personally reviewed by myself on todays visit Normal sinus rhythm rate 60 bpm no significant ST-T wave changes  Other past medical history reviewed admitted 10/09/19 in setting of positive cultures for GBS bacteremia. Source of infection not identified.   TEE with LVEF 25-35%, gr1DD, significant wall motion abnormalities with images suggestive of stress induced CM versus LAD infarct.  TEE with no evidence of vegetation/endocarditis   He was started on feeding tube due to R vocal cord paralysis. Noted 2.2cm intraluminal density which would require further GI workup.    Seen in clinic 11/26/19 noting recent syncopal episode with LOC for 10 seconds.    Cardiac cath 11/28/19 with severe single-vessel coronary disease with 90% ostial stenosis involving small ramus intermedius that fills via right-to-left collaterals, mild to moderate nonobstructive  CAD involving LAD, LCx, RCA, upper normal to mildly elevated L/R heart filling pressures, mild to moderate AS.    echocardiogram 01/14/2020 with EF  normalized with LVEF 60 to 65%, no RWMA, mild LVH, RV normal size and function, trivial MR, mild dilation of ascending aorta 39 mm.    R/LHC 11/28/19  Severe single-vessel coronary artery disease with 90% ostial stenosis involving small ramus intermedius branch that also fills via right-to-left collaterals. Mild to moderate, non-obstructive coronary artery disease involving the LAD, LCx, and RCA. Upper normal to mildly elevated left and right heart filling pressures. Hyperdynamic left ventricular contraction. Mild to moderate aortic stenosis.      TTE 10/08/19  1. Left ventricular ejection fraction, by estimation, is 25 to 30%. The  left ventricle has severely decreased function. The left ventricle has no  regional wall motion abnormalities. There is moderate left ventricular  hypertrophy. Left ventricular  diastolic parameters are consistent with Grade I diastolic dysfunction  (impaired relaxation). There is severe akinesis of the left ventricular,  mid-apical anteroseptal wall, anterolateral wall, anterior segment, apical  segment and inferoseptal wall. Wall   motion abnormalities are suggestive of stress induced cardiomyopathy vs.  LAD infarct.   2. Right ventricular systolic function is normal. The right ventricular  size is normal. There is mildly elevated pulmonary artery systolic  pressure.   3. Left atrial size was mildly dilated.   4. The mitral valve is normal in structure. No evidence of mitral valve  regurgitation. No evidence of mitral stenosis.   5. The aortic  valve is normal in structure. Aortic valve regurgitation is  not visualized. Mild to moderate aortic valve sclerosis/calcification is  present, without any evidence of aortic stenosis.   6. The inferior vena cava is normal in size with greater than 50%  respiratory  variability, suggesting right atrial pressure of 3 mmHg.   7. No clear vegetations but suboptimal study.    TEE 10/12/19  1. Left ventricular ejection fraction, by estimation, is 25 to 30%. The  left ventricle has severely decreased function. The left ventricle  demonstrates global hypokinesis.   2. Right ventricular systolic function is normal. The right ventricular  size is normal. Tricuspid regurgitation signal is inadequate for assessing  PA pressure.   3. The mitral valve is normal in structure. No evidence of mitral valve  regurgitation.   4. Aortic valve regurgitation is not visualized. Severe calcification of  aortic valve. Visually, there appears to be moderate aortic valve  stenosis.   5. Grossly, no valve vegetation noted.    Past Medical History:  Diagnosis Date   Abnormal glucose    Arthritis    Baker's cyst of knee    left   BPH (benign prostatic hypertrophy)    Cough    because of "tight" esophagus   Diabetes mellitus    Glaucoma    HOH (hard of hearing)    Hypertension    Pheochromocytoma of right adrenal gland    Stroke [facility])    Ulcer    Wears hearing aid    bilateral   Past Surgical History:  Procedure Laterality Date   adrenaletomy     CARDIAC CATHETERIZATION     CATARACT EXTRACTION W/PHACO Left 10/08/2015   Procedure: CATARACT EXTRACTION PHACO AND INTRAOCULAR LENS PLACEMENT ([place]) left eye;  Surgeon: Leandrew Koyanagi, MD;  Location: [place];  Service: Ophthalmology;  Laterality: Left;  [place]     RIGHT/LEFT HEART CATH AND CORONARY ANGIOGRAPHY Left 11/28/2019   Procedure: RIGHT/LEFT HEART CATH AND CORONARY ANGIOGRAPHY;  Surgeon: Nelva Bush, MD;  Location: [place] [facility];  Service: Cardiovascular;  Laterality: Left;   SQUAMOUS CELL CARCINOMA EXCISION  03-2006   left ear   TEE WITHOUT CARDIOVERSION N/A 10/12/2019   Procedure: TRANSESOPHAGEAL ECHOCARDIOGRAM (TEE);  Surgeon: Minna Merritts, MD;  Location: [facility]  [facility];  Service: Cardiovascular;  Laterality: N/A;   TONSILLECTOMY     XI ROBOTIC LAPAROSCOPIC ASSISTED APPENDECTOMY N/A 10/11/2019   Procedure: XI ROBOTIC ASSISTED LAPAROSCOPIC INSERTION GASTROSTOMY TUBE;  Surgeon: Jules Husbands, MD;  Location: [facility] OR[facility];  Service: General;  Laterality: N/A;      Allergies:   Patient has no known allergies.   Social History   Tobacco Use   Smoking status: Never    Passive exposure: Never   Smokeless tobacco: Never  Vaping Use   Vaping Use: Never used  Substance Use Topics   Alcohol use: Not Currently    Alcohol/week: 1.0 standard drink of alcohol    Types: 1 Glasses of wine per week    Comment: 1 glass per week   Drug use: No     Current Outpatient Medications on File Prior to Visit  Medication Sig Dispense Refill   amLODipine (NORVASC) 5 MG tablet TAKE ONE TABLET BY MOUTH TWICE DAILY 180 tablet 0   atorvastatin (LIPITOR) 20 MG tablet TAKE ONE TABLET BY MOUTH EVERY DAY 90 tablet 3   benzonatate (TESSALON) 200 MG capsule Take 1 capsule (200 mg total) by mouth 3 (three) times  daily as needed for cough. 60 capsule 0   dorzolamide-timolol (COSOPT) 22.3-6.8 MG/ML ophthalmic solution 1 drop 2 (two) times daily.     ezetimibe (ZETIA) 10 MG tablet TAKE ONE TABLET BY MOUTH EVERY DAY 90 tablet 0   finasteride (PROPECIA) 1 MG tablet Take 1 tablet (1 mg total) by mouth daily. 90 tablet 3   HYDROcodone bit-homatropine (HYCODAN) 5-1.5 MG/5ML syrup Take 5 mLs by mouth at bedtime as needed for cough. 120 mL 0   JARDIANCE 10 MG TABS tablet TAKE ONE TABLET (10 MG) BY MOUTH EVERY DAY BEFORE BREAKFAST 90 tablet 3   latanoprost (XALATAN) 0.005 % ophthalmic solution Place 1 drop into both eyes at bedtime.     levofloxacin (LEVAQUIN) 500 MG tablet Take 1 tablet (500 mg total) by mouth daily. 7 tablet 0   losartan (COZAAR) 100 MG tablet TAKE ONE TABLET BY MOUTH EVERY DAY 90 tablet 3   meloxicam (MOBIC) 7.5 MG tablet      metFORMIN (GLUCOPHAGE) 500 MG tablet TAKE ONE  TABLET BY MOUTH TWICE DAILY WITH A MEAL 180 tablet 3   metoprolol succinate (TOPROL-XL) 25 MG 24 hr tablet Take 1 tablet (25 mg total) by mouth daily. 30 tablet 6   pantoprazole (PROTONIX) 20 MG tablet TAKE 1 TABLET BY MOUTH DAILY 90 tablet 3   polyethylene glycol (MIRALAX / GLYCOLAX) 17 g packet Take 17 g by mouth daily.     tamsulosin (FLOMAX) 0.4 MG CAPS capsule TAKE 2 CAPSULES EVERY DAY 180 capsule 3   torsemide (DEMADEX) 20 MG tablet Take 1 tablet (20 mg total) by mouth as directed. Take 1 tablet on Monday Wednesday Friday with extra 1 tablet for swelling. 1 tablet 0   No current facility-administered medications on file prior to visit.     Family Hx: The patient's family history includes Alcohol abuse in his mother; Brain cancer in his father; Coronary artery disease in his mother; Diabetes in his brother.  ROS:   Please see the history of present illness.    Review of Systems  Constitutional: Negative.   HENT: Negative.    Respiratory: Negative.    Cardiovascular: Negative.   Gastrointestinal: Negative.   Musculoskeletal: Negative.   Neurological: Negative.   Psychiatric/Behavioral: Negative.    All other systems reviewed and are negative.     Labs/Other Tests and Data Reviewed:    Recent Labs: 01/17/2022: ALT 28 01/28/2022: B Natriuretic Peptide 165.3; Hemoglobin 15.2; Platelets 340 04/26/2022: BUN 53; Creatinine, Ser 1.77; Potassium 5.0; Sodium 138   Recent Lipid Panel Lab Results  Component Value Date/Time   CHOL 139 12/22/2021 11:19 AM   TRIG 70.0 12/22/2021 11:19 AM   HDL 80.40 12/22/2021 11:19 AM   CHOLHDL 2 12/22/2021 11:19 AM   LDLCALC 44 12/22/2021 11:19 AM   LDLDIRECT 126.2 04/05/2013 07:39 AM    Wt Readings from Last 3 Encounters:  05/13/22 176 lb (79.8 kg)  05/07/22 176 lb (79.8 kg)  04/26/22 174 lb (78.9 kg)     Exam:   There were no vitals taken for this visit.  Constitutional:  oriented to person, place, and time. No distress.  HENT:  Head:  Grossly normal Eyes:  no discharge. No scleral icterus.  Neck: No JVD, no carotid bruits  Cardiovascular: Regular rate and rhythm, no murmurs appreciated Pulmonary/Chest: Clear to auscultation bilaterally, no wheezes or rails Abdominal: Soft.  no distension.  no tenderness.  Musculoskeletal: Normal range of motion Neurological:  normal muscle tone. Coordination normal. No atrophy Skin:  Skin warm and dry Psychiatric: normal affect, pleasant   ASSESSMENT & PLAN:    Problem List Items Addressed This Visit   None CAD with stable angina Currently with no symptoms of angina. No further workup at this time. Continue current medication regimen.  Cardiomyopathy Ejection fraction fully recovered No further work-up needed at this time  HTN Blood pressure is well controlled on today's visit. No changes made to the medications.  Bacteremia: Recovered well, active at [facility]   Total encounter time more than 25 minutes  Greater than 50% was spent in counseling and coordination of care with the patient     Signed, Ida Rogue, [place] [id number], [place], [place] [address]

## 2022-05-17 ENCOUNTER — Other Ambulatory Visit: Payer: Self-pay | Admitting: Internal Medicine

## 2022-05-17 ENCOUNTER — Ambulatory Visit: Payer: Medicare PPO | Admitting: Cardiovascular Disease

## 2022-05-17 ENCOUNTER — Telehealth: Payer: Self-pay | Admitting: Internal Medicine

## 2022-05-17 DIAGNOSIS — Z8673 Personal history of transient ischemic attack (TIA), and cerebral infarction without residual deficits: Secondary | ICD-10-CM

## 2022-05-17 DIAGNOSIS — N1832 Chronic kidney disease, stage 3b: Secondary | ICD-10-CM

## 2022-05-17 DIAGNOSIS — R42 Dizziness and giddiness: Secondary | ICD-10-CM

## 2022-05-17 DIAGNOSIS — E785 Hyperlipidemia, unspecified: Secondary | ICD-10-CM

## 2022-05-17 DIAGNOSIS — R001 Bradycardia, unspecified: Secondary | ICD-10-CM

## 2022-05-17 DIAGNOSIS — I5020 Unspecified systolic (congestive) heart failure: Secondary | ICD-10-CM

## 2022-05-17 DIAGNOSIS — I25118 Atherosclerotic heart disease of native coronary artery with other forms of angina pectoris: Secondary | ICD-10-CM

## 2022-05-17 DIAGNOSIS — R000 Tachycardia, unspecified: Secondary | ICD-10-CM

## 2022-05-17 DIAGNOSIS — E1159 Type 2 diabetes mellitus with other circulatory complications: Secondary | ICD-10-CM

## 2022-05-17 DIAGNOSIS — I420 Dilated cardiomyopathy: Secondary | ICD-10-CM

## 2022-05-17 DIAGNOSIS — I10 Essential (primary) hypertension: Secondary | ICD-10-CM

## 2022-05-17 NOTE — Telephone Encounter (Signed)
This was already done at the request of the pharmacy.

## 2022-05-17 NOTE — Telephone Encounter (Signed)
Patient called to get a refill on benzonatate benzonatate (TESSALON) 200 MG capsule   and he wants 120 tablets sent to  [place], [place] Phone: [phone]  Fax: [phone]

## 2022-05-20 ENCOUNTER — Encounter: Payer: Self-pay | Admitting: Internal Medicine

## 2022-05-24 NOTE — Telephone Encounter (Signed)
Spoke to pt's wife, scheduled ov with [facility] on 05/27/22 @ 3pm

## 2022-05-25 NOTE — Telephone Encounter (Signed)
Noted  

## 2022-05-26 ENCOUNTER — Other Ambulatory Visit: Payer: Self-pay | Admitting: Cardiology

## 2022-05-27 ENCOUNTER — Ambulatory Visit: Payer: Medicare PPO | Admitting: Family Medicine

## 2022-06-23 ENCOUNTER — Other Ambulatory Visit: Payer: Self-pay | Admitting: Cardiology

## 2022-06-25 ENCOUNTER — Ambulatory Visit: Payer: Medicare PPO | Admitting: Internal Medicine

## 2022-07-07 ENCOUNTER — Ambulatory Visit: Payer: Medicare PPO | Admitting: Internal Medicine

## 2022-07-07 ENCOUNTER — Ambulatory Visit (INDEPENDENT_AMBULATORY_CARE_PROVIDER_SITE_OTHER)
Admission: RE | Admit: 2022-07-07 | Discharge: 2022-07-07 | Disposition: A | Discharge status: Home / Self Care | Payer: Medicare PPO | Source: Ambulatory Visit | Attending: Internal Medicine | Admitting: Internal Medicine

## 2022-07-07 ENCOUNTER — Encounter: Payer: Self-pay | Admitting: Internal Medicine

## 2022-07-07 VITALS — BP 110/70 | HR 60 | Temp 97.8°F | Ht 71.0 in | Wt 170.0 lb

## 2022-07-07 DIAGNOSIS — E1159 Type 2 diabetes mellitus with other circulatory complications: Secondary | ICD-10-CM

## 2022-07-07 DIAGNOSIS — J181 Lobar pneumonia, unspecified organism: Secondary | ICD-10-CM

## 2022-07-07 DIAGNOSIS — J439 Emphysema, unspecified: Secondary | ICD-10-CM | POA: Diagnosis not present

## 2022-07-07 DIAGNOSIS — I5020 Unspecified systolic (congestive) heart failure: Secondary | ICD-10-CM | POA: Diagnosis not present

## 2022-07-07 DIAGNOSIS — N1832 Chronic kidney disease, stage 3b: Secondary | ICD-10-CM | POA: Diagnosis not present

## 2022-07-07 DIAGNOSIS — E119 Type 2 diabetes mellitus without complications: Secondary | ICD-10-CM

## 2022-07-07 LAB — POCT GLYCOSYLATED HEMOGLOBIN (HGB A1C): Hemoglobin A1C: 6.7 % — AB (ref 4.0–5.6)

## 2022-07-07 LAB — HM DIABETES FOOT EXAM

## 2022-07-07 NOTE — Assessment & Plan Note (Signed)
Seems to be better Will check CXR to make sure it has resolved

## 2022-07-07 NOTE — Assessment & Plan Note (Signed)
Is on the losartan and jardiance Recheck next time

## 2022-07-07 NOTE — Assessment & Plan Note (Signed)
Lab Results  Component Value Date   HGBA1C 6.7 (A) 07/07/2022   Still with excellent control on jardiance 10, metformin 500 bid

## 2022-07-07 NOTE — Assessment & Plan Note (Signed)
Trace edema with three times weekly torsemide 20, metoprolol 25 daily, losartan 100 and the jardiance

## 2022-07-07 NOTE — Progress Notes (Signed)
Subjective:    Patient ID: Dustin Harrison, male    DOB: 1932/06/07, 89 y.o.   MRN: [id number]  HPI Here with wife for follow up of diabetes and other chronic conditions  Breathing and cough are better Benzonatate definitely helped--not needing now  Not checking sugars Eating healthy Tries to walk or use pool at least weekly  No chest pain No SOB No palpitations No dizziness or syncope Continues on the torsemide every Mon/Wed/Fri---that keeps the edema down  Last GFR 36  Current Outpatient Medications on File Prior to Visit  Medication Sig Dispense Refill   amLODipine (NORVASC) 5 MG tablet TAKE ONE TABLET BY MOUTH TWICE DAILY 180 tablet 1   atorvastatin (LIPITOR) 20 MG tablet TAKE ONE TABLET BY MOUTH EVERY DAY 90 tablet 3   dorzolamide-timolol (COSOPT) 22.3-6.8 MG/ML ophthalmic solution 1 drop 2 (two) times daily.     ezetimibe (ZETIA) 10 MG tablet TAKE ONE TABLET BY MOUTH EVERY DAY 90 tablet 0   finasteride (PROPECIA) 1 MG tablet Take 1 tablet (1 mg total) by mouth daily. 90 tablet 3   JARDIANCE 10 MG TABS tablet TAKE ONE TABLET (10 MG) BY MOUTH EVERY DAY BEFORE BREAKFAST 90 tablet 3   latanoprost (XALATAN) 0.005 % ophthalmic solution Place 1 drop into both eyes at bedtime.     losartan (COZAAR) 100 MG tablet TAKE ONE TABLET BY MOUTH EVERY DAY 90 tablet 3   meloxicam (MOBIC) 7.5 MG tablet      metFORMIN (GLUCOPHAGE) 500 MG tablet TAKE ONE TABLET BY MOUTH TWICE DAILY WITH A MEAL 180 tablet 3   metoprolol succinate (TOPROL-XL) 25 MG 24 hr tablet TAKE 1 TABLET BY MOUTH DAILY 30 tablet 3   pantoprazole (PROTONIX) 20 MG tablet TAKE 1 TABLET BY MOUTH DAILY 90 tablet 3   polyethylene glycol (MIRALAX / GLYCOLAX) 17 g packet Take 17 g by mouth daily.     tamsulosin (FLOMAX) 0.4 MG CAPS capsule TAKE 2 CAPSULES EVERY DAY 180 capsule 3   torsemide (DEMADEX) 20 MG tablet Take 1 tablet (20 mg total) by mouth as directed. Take 1 tablet on Monday Wednesday Friday with extra 1 tablet for  swelling. 1 tablet 0   No current facility-administered medications on file prior to visit.    No Known Allergies  Past Medical History:  Diagnosis Date   Abnormal glucose    Arthritis    Baker's cyst of knee    left   BPH (benign prostatic hypertrophy)    Cough    because of "tight" esophagus   Diabetes mellitus    Glaucoma    HOH (hard of hearing)    Hypertension    Pheochromocytoma of right adrenal gland    Stroke [facility])    Ulcer    Wears hearing aid    bilateral    Past Surgical History:  Procedure Laterality Date   adrenaletomy     CARDIAC CATHETERIZATION     CATARACT EXTRACTION W/PHACO Left 10/08/2015   Procedure: CATARACT EXTRACTION PHACO AND INTRAOCULAR LENS PLACEMENT ([place]) left eye;  Surgeon: Leandrew Koyanagi, MD;  Location: [place];  Service: Ophthalmology;  Laterality: Left;  [place]     RIGHT/LEFT HEART CATH AND CORONARY ANGIOGRAPHY Left 11/28/2019   Procedure: RIGHT/LEFT HEART CATH AND CORONARY ANGIOGRAPHY;  Surgeon: Nelva Bush, MD;  Location: [place] [facility];  Service: Cardiovascular;  Laterality: Left;   SQUAMOUS CELL CARCINOMA EXCISION  03-2006   left ear   TEE WITHOUT  CARDIOVERSION N/A 10/12/2019   Procedure: TRANSESOPHAGEAL ECHOCARDIOGRAM (TEE);  Surgeon: Minna Merritts, MD;  Location: [facility];  Service: Cardiovascular;  Laterality: N/A;   TONSILLECTOMY     XI ROBOTIC LAPAROSCOPIC ASSISTED APPENDECTOMY N/A 10/11/2019   Procedure: XI ROBOTIC ASSISTED LAPAROSCOPIC INSERTION GASTROSTOMY TUBE;  Surgeon: Jules Husbands, MD;  Location: [facility];  Service: General;  Laterality: N/A;    Family History  Problem Relation Age of Onset   Alcohol abuse Mother    Coronary artery disease Mother    Brain cancer Father        tumor   Diabetes Brother     Social History   Socioeconomic History   Marital status: Married    Spouse name: Not on file   Number of children: Not on file   Years of education: Not on file    Highest education level: Not on file  Occupational History   Occupation: chaplain  Tobacco Use   Smoking status: Never    Passive exposure: Never   Smokeless tobacco: Never  Vaping Use   Vaping Use: Never used  Substance and Sexual Activity   Alcohol use: Not Currently    Alcohol/week: 1.0 standard drink of alcohol    Types: 1 Glasses of wine per week    Comment: 1 glass per week   Drug use: No   Sexual activity: Not Currently  Other Topics Concern   Not on file  Social History Narrative   Regular exercise- yes- 3-4 times a week treadmill/walk   Diet: fruits and veggies,water   Living will, HCPOA (wife) , full code (reviewed 4)   Social Determinants of Health   Financial Resource Strain: Low Risk  (06/09/2021)   Overall Financial Resource Strain (CARDIA)    Difficulty of Paying Living Expenses: Not hard at all  Food Insecurity: No Food Insecurity (06/09/2021)   Hunger Vital Sign    Worried About Running Out of Food in the Last Year: Never true    [place] in the Last Year: Never true  Transportation Needs: No Transportation Needs (06/09/2021)   PRAPARE - Hydrologist (Medical): No    Lack of Transportation (Non-Medical): No  Physical Activity: Insufficiently Active (06/09/2021)   Exercise Vital Sign    Days of Exercise per Week: 2 days    Minutes of Exercise per Session: 60 min  Stress: No Stress Concern Present (06/09/2021)   [place]    Feeling of Stress : Not at all  Social Connections: [place] (06/09/2021)   Social Connection and Isolation Panel [NHANES]    Frequency of Communication with Friends and Family: More than three times a week    Frequency of Social Gatherings with Friends and Family: Three times a week    Attends Religious Services: More than 4 times per year    Active Member of Clubs or Organizations: Yes    Attends Archivist Meetings:  More than 4 times per year    Marital Status: Married  Human resources officer Violence: Not At Risk (06/09/2021)   Humiliation, Afraid, Rape, and Kick questionnaire    Fear of Current or Ex-Partner: No    Emotionally Abused: No    Physically Abused: No    Sexually Abused: No   Review of Systems Wife notices he sleep more Weight seems stable Voids okay    Objective:   Physical Exam Constitutional:      Appearance:  Normal appearance.  Cardiovascular:     Rate and Rhythm: Normal rate and regular rhythm.     Comments: Faint pedal pulses Gr 3/6 systolic murmur loudest at apex Pulmonary:     Effort: Pulmonary effort is normal.     Breath sounds: Normal breath sounds. No wheezing or rales.  Musculoskeletal:     Cervical back: Neck supple.     Comments: Trace ankle edema  Lymphadenopathy:     Cervical: No cervical adenopathy.  Skin:    Comments: No foot lesions  Neurological:     Mental Status: He is alert.     Comments: Fairly normal sensation in feet  Psychiatric:        Mood and Affect: Mood normal.        Behavior: Behavior normal.            Assessment & Plan:

## 2022-07-18 NOTE — Progress Notes (Unsigned)
Date:  07/19/2022   ID:  Dustin Harrison, DOB Jul 11, 1931, MRN [id number]  Patient Location:  [address] [place] [address]   Provider location:   [facility], [place] office  PCP:  [name], MD  Cardiologist:  Arvid Right [facility]   Chief Complaint  Patient presents with   3 week follow up     "Doing well." Medications reviewed by the patient verbally.     History of Present Illness:    Dustin Harrison is a 89 y.o. male past medical history of HFrEF, bradycardia, h/o pheochromocytoma, R adrenal tumor s/p R adrenalectomy, cochlear implant on left 06/2018,  Dm2,  chronic back pain,  bacteremia 10/2019 s/p TEE and IV abx, LVEF 25-35% CKD,  CAD,  aortic sclerosis  syncope  Echo 01/2020: Ef 60% Who presents for f/u of his cardiomyopathy, syncope,  CAD, aortic valve sclerosis, CKD  Last seen by myself in clinic August 2022 Seen by one of our providers August, November 2023  VQ scan August 2023, no PE Lives at [place] once a week Reports his wife has weakness, gait instability  EKG in 11/23 reviewed, no significant changes noted Denies significant chest pain concerning for angina No leg swelling, no PND orthopnea  Takes torsemide 20 mg Monday Wednesday Friday No BMP since starting torsemide Feels the diuretic has helped his leg swelling and shortness of breath  Other past medical history reviewed admitted 10/09/19 in setting of positive cultures for GBS bacteremia. Source of infection not identified.   TEE with LVEF 25-35%, gr1DD, significant wall motion abnormalities with images suggestive of stress induced CM versus LAD infarct.  TEE with no evidence of vegetation/endocarditis   He was started on feeding tube due to R vocal cord paralysis. Noted 2.2cm intraluminal density which would require further GI workup.    Seen in clinic 11/26/19 noting recent syncopal episode with LOC for 10 seconds.    Cardiac cath 11/28/19  with severe single-vessel coronary disease with 90% ostial stenosis involving small ramus intermedius that fills via right-to-left collaterals, mild to moderate nonobstructive CAD involving LAD, LCx, RCA, upper normal to mildly elevated L/R heart filling pressures, mild to moderate AS.    echocardiogram 01/14/2020 with EF  normalized with LVEF 60 to 65%, no RWMA, mild LVH, RV normal size and function, trivial MR, mild dilation of ascending aorta 39 mm.    R/LHC 11/28/19  Severe single-vessel coronary artery disease with 90% ostial stenosis involving small ramus intermedius branch that also fills via right-to-left collaterals. Mild to moderate, non-obstructive coronary artery disease involving the LAD, LCx, and RCA. Upper normal to mildly elevated left and right heart filling pressures. Hyperdynamic left ventricular contraction. Mild to moderate aortic stenosis.     TTE 10/08/19  1. Left ventricular ejection fraction, by estimation, is 25 to 30%. The  left ventricle has severely decreased function. The left ventricle has no  regional wall motion abnormalities. There is moderate left ventricular  hypertrophy. Left ventricular  diastolic parameters are consistent with Grade I diastolic dysfunction  (impaired relaxation). There is severe akinesis of the left ventricular,  mid-apical anteroseptal wall, anterolateral wall, anterior segment, apical  segment and inferoseptal wall. Wall   motion abnormalities are suggestive of stress induced cardiomyopathy vs.  LAD infarct.   2. Right ventricular systolic function is normal. The right ventricular  size is normal. There is mildly elevated pulmonary artery systolic  pressure.   3. Left atrial  size was mildly dilated.   4. The mitral valve is normal in structure. No evidence of mitral valve  regurgitation. No evidence of mitral stenosis.   5. The aortic valve is normal in structure. Aortic valve regurgitation is  not visualized. Mild to moderate  aortic valve sclerosis/calcification is  present, without any evidence of aortic stenosis.   6. The inferior vena cava is normal in size with greater than 50%  respiratory variability, suggesting right atrial pressure of 3 mmHg.   7. No clear vegetations but suboptimal study.    TEE 10/12/19  1. Left ventricular ejection fraction, by estimation, is 25 to 30%. The  left ventricle has severely decreased function. The left ventricle  demonstrates global hypokinesis.   2. Right ventricular systolic function is normal. The right ventricular  size is normal. Tricuspid regurgitation signal is inadequate for assessing  PA pressure.   3. The mitral valve is normal in structure. No evidence of mitral valve  regurgitation.   4. Aortic valve regurgitation is not visualized. Severe calcification of  aortic valve. Visually, there appears to be moderate aortic valve  stenosis.   5. Grossly, no valve vegetation noted.    Past Medical History:  Diagnosis Date   Abnormal glucose    Arthritis    Baker's cyst of knee    left   BPH (benign prostatic hypertrophy)    Cough    because of "tight" esophagus   Diabetes mellitus    Glaucoma    HOH (hard of hearing)    Hypertension    Pheochromocytoma of right adrenal gland    Stroke [facility])    Ulcer    Wears hearing aid    bilateral   Past Surgical History:  Procedure Laterality Date   adrenaletomy     CARDIAC CATHETERIZATION     CATARACT EXTRACTION W/PHACO Left 10/08/2015   Procedure: CATARACT EXTRACTION PHACO AND INTRAOCULAR LENS PLACEMENT ([place]) left eye;  Surgeon: Leandrew Koyanagi, MD;  Location: [place];  Service: Ophthalmology;  Laterality: Left;  [place]     RIGHT/LEFT HEART CATH AND CORONARY ANGIOGRAPHY Left 11/28/2019   Procedure: RIGHT/LEFT HEART CATH AND CORONARY ANGIOGRAPHY;  Surgeon: Nelva Bush, MD;  Location: [place] [facility];  Service: Cardiovascular;  Laterality: Left;   SQUAMOUS CELL CARCINOMA  EXCISION  03-2006   left ear   TEE WITHOUT CARDIOVERSION N/A 10/12/2019   Procedure: TRANSESOPHAGEAL ECHOCARDIOGRAM (TEE);  Surgeon: Minna Merritts, MD;  Location: [facility];  Service: Cardiovascular;  Laterality: N/A;   TONSILLECTOMY     XI ROBOTIC LAPAROSCOPIC ASSISTED APPENDECTOMY N/A 10/11/2019   Procedure: XI ROBOTIC ASSISTED LAPAROSCOPIC INSERTION GASTROSTOMY TUBE;  Surgeon: Jules Husbands, MD;  Location: [facility];  Service: General;  Laterality: N/A;      Allergies:   Patient has no known allergies.   Social History   Tobacco Use   Smoking status: Never    Passive exposure: Never   Smokeless tobacco: Never  Vaping Use   Vaping Use: Never used  Substance Use Topics   Alcohol use: Not Currently    Alcohol/week: 1.0 standard drink of alcohol    Types: 1 Glasses of wine per week    Comment: 1 glass per week   Drug use: No     Current Outpatient Medications on File Prior to Visit  Medication Sig Dispense Refill   amLODipine (NORVASC) 5 MG tablet TAKE ONE TABLET BY MOUTH TWICE DAILY 180 tablet 1   atorvastatin (LIPITOR)  20 MG tablet TAKE ONE TABLET BY MOUTH EVERY DAY 90 tablet 3   dorzolamide-timolol (COSOPT) 22.3-6.8 MG/ML ophthalmic solution 1 drop 2 (two) times daily.     ezetimibe (ZETIA) 10 MG tablet TAKE ONE TABLET BY MOUTH EVERY DAY 90 tablet 0   finasteride (PROPECIA) 1 MG tablet Take 1 tablet (1 mg total) by mouth daily. 90 tablet 3   JARDIANCE 10 MG TABS tablet TAKE ONE TABLET (10 MG) BY MOUTH EVERY DAY BEFORE BREAKFAST 90 tablet 3   latanoprost (XALATAN) 0.005 % ophthalmic solution Place 1 drop into both eyes at bedtime.     losartan (COZAAR) 100 MG tablet TAKE ONE TABLET BY MOUTH EVERY DAY 90 tablet 3   metFORMIN (GLUCOPHAGE) 500 MG tablet TAKE ONE TABLET BY MOUTH TWICE DAILY WITH A MEAL 180 tablet 3   metoprolol succinate (TOPROL-XL) 25 MG 24 hr tablet TAKE 1 TABLET BY MOUTH DAILY 30 tablet 3   pantoprazole (PROTONIX) 20 MG tablet TAKE 1 TABLET BY MOUTH DAILY 90  tablet 3   polyethylene glycol (MIRALAX / GLYCOLAX) 17 g packet Take 17 g by mouth daily.     tamsulosin (FLOMAX) 0.4 MG CAPS capsule TAKE 2 CAPSULES EVERY DAY 180 capsule 3   torsemide (DEMADEX) 20 MG tablet Take 1 tablet (20 mg total) by mouth as directed. Take 1 tablet on Monday Wednesday Friday with extra 1 tablet for swelling. 1 tablet 0   meloxicam (MOBIC) 7.5 MG tablet  (Patient not taking: Reported on 07/19/2022)     No current facility-administered medications on file prior to visit.     Family Hx: The patient's family history includes Alcohol abuse in his mother; Brain cancer in his father; Coronary artery disease in his mother; Diabetes in his brother.  ROS:   Please see the history of present illness.    Review of Systems  Constitutional: Negative.   HENT: Negative.    Respiratory: Negative.    Cardiovascular: Negative.   Gastrointestinal: Negative.   Musculoskeletal: Negative.   Neurological: Negative.   Psychiatric/Behavioral: Negative.    All other systems reviewed and are negative.     Labs/Other Tests and Data Reviewed:    Recent Labs: 01/17/2022: ALT 28 01/28/2022: B Natriuretic Peptide 165.3; Hemoglobin 15.2; Platelets 340 07/19/2022: BUN 48; Creatinine, Ser 1.37; Potassium 4.2; Sodium 138   Recent Lipid Panel Lab Results  Component Value Date/Time   CHOL 139 12/22/2021 11:19 AM   TRIG 70.0 12/22/2021 11:19 AM   HDL 80.40 12/22/2021 11:19 AM   CHOLHDL 2 12/22/2021 11:19 AM   LDLCALC 44 12/22/2021 11:19 AM   LDLDIRECT 126.2 04/05/2013 07:39 AM    Wt Readings from Last 3 Encounters:  07/19/22 174 lb 8 oz (79.2 kg)  07/07/22 170 lb (77.1 kg)  05/13/22 176 lb (79.8 kg)     Exam:   BP (!) 142/72 (BP Location: Left Arm, Patient Position: Sitting, Cuff Size: Normal)   Pulse 63   Ht 5' 11"$  (1.803 m)   Wt 174 lb 8 oz (79.2 kg)   SpO2 93%   BMI 24.34 kg/m  Constitutional:  oriented to person, place, and time. No distress.  HENT:  Head: Grossly  normal Eyes:  no discharge. No scleral icterus.  Neck: No JVD, no carotid bruits  Cardiovascular: Regular rate and rhythm, no murmurs appreciated Pulmonary/Chest: Clear to auscultation bilaterally, no wheezes or rails Abdominal: Soft.  no distension.  no tenderness.  Musculoskeletal: Normal range of motion Neurological:  normal muscle tone. Coordination normal.  No atrophy Skin: Skin warm and dry Psychiatric: normal affect, pleasant   ASSESSMENT & PLAN:    Problem List Items Addressed This Visit       Cardiology Problems   HFrEF (heart failure with reduced ejection fraction) (HCC) (Chronic)   Other Visit Diagnoses     Coronary artery disease of native artery of native heart with stable angina pectoris ([place])    -  Primary   Relevant Orders   Basic Metabolic Panel (BMET) (Completed)   Essential hypertension       Relevant Orders   Basic Metabolic Panel (BMET) (Completed)   Hyperlipidemia LDL goal <70       Relevant Orders   Basic Metabolic Panel (BMET) (Completed)   Dilated cardiomyopathy ([place])       Sinus tachycardia       Sinus bradycardia       Bradycardia         CAD with stable angina Prior catheterization June 2021, medical management recommended Currently with no symptoms of angina. No further workup at this time. Continue current medication regimen.  Cholesterol at goal  Cardiomyopathy Ejection fraction fully recovered on echo August 2021 No further work-up needed at this time Reports feeling better on torsemide 21m 3 times a week with potassium BMP ordered today  HTN Blood pressure is well controlled on today's visit. No changes made to the medications.     Total encounter time more than 30 minutes  Greater than 50% was spent in counseling and coordination of care with the patient     Signed, TIda Rogue MMorning [id number], [place] [phone]

## 2022-07-19 ENCOUNTER — Ambulatory Visit: Payer: Medicare PPO | Attending: Cardiovascular Disease | Admitting: Cardiovascular Disease

## 2022-07-19 ENCOUNTER — Other Ambulatory Visit
Admission: RE | Admit: 2022-07-19 | Discharge: 2022-07-19 | Disposition: A | Discharge status: Home / Self Care | Payer: Medicare PPO | Source: Ambulatory Visit | Attending: Cardiovascular Disease | Admitting: Cardiovascular Disease

## 2022-07-19 ENCOUNTER — Encounter: Payer: Self-pay | Admitting: Cardiovascular Disease

## 2022-07-19 VITALS — BP 142/72 | HR 63 | Ht 71.0 in | Wt 174.5 lb

## 2022-07-19 DIAGNOSIS — I420 Dilated cardiomyopathy: Secondary | ICD-10-CM | POA: Diagnosis not present

## 2022-07-19 DIAGNOSIS — I5020 Unspecified systolic (congestive) heart failure: Secondary | ICD-10-CM

## 2022-07-19 DIAGNOSIS — R001 Bradycardia, unspecified: Secondary | ICD-10-CM | POA: Diagnosis not present

## 2022-07-19 DIAGNOSIS — R000 Tachycardia, unspecified: Secondary | ICD-10-CM | POA: Diagnosis not present

## 2022-07-19 DIAGNOSIS — E785 Hyperlipidemia, unspecified: Secondary | ICD-10-CM

## 2022-07-19 DIAGNOSIS — I25118 Atherosclerotic heart disease of native coronary artery with other forms of angina pectoris: Secondary | ICD-10-CM

## 2022-07-19 DIAGNOSIS — I10 Essential (primary) hypertension: Secondary | ICD-10-CM | POA: Diagnosis not present

## 2022-07-19 LAB — BASIC METABOLIC PANEL
Anion gap: 10 (ref 5–15)
BUN: 48 mg/dL — ABNORMAL HIGH (ref 8–23)
CO2: 24 mmol/L (ref 22–32)
Calcium: 9.5 mg/dL (ref 8.9–10.3)
Chloride: 104 mmol/L (ref 98–111)
Creatinine, Ser: 1.37 mg/dL — ABNORMAL HIGH (ref 0.61–1.24)
GFR, Estimated: 50 mL/min — ABNORMAL LOW (ref >60)
Glucose, Bld: 132 mg/dL — ABNORMAL HIGH (ref 70–99)
Potassium: 4.2 mmol/L (ref 3.5–5.1)
Sodium: 138 mmol/L (ref 135–145)

## 2022-07-19 NOTE — Patient Instructions (Addendum)
Medication Instructions:  No changes  If you need a refill on your cardiac medications before your next appointment, please call your pharmacy.   Lab work: [employer]  Nature conservation officer at [facility] 1st desk on the right to check in (REGISTRATION)  Lab hours: Monday- Friday (7:30 am- 5:30 pm)  Testing/Procedures:  Testings: No new testing needed  Follow-Up: At [facility], you and your health needs are our priority.  As part of our continuing mission to provide you with exceptional heart care, we have created designated Provider Care Teams.  These Care Teams include your primary Cardiologist (physician) and Advanced Practice Providers (APPs -  Physician Assistants and Nurse Practitioners) who all work together to provide you with the care you need, when you need it.  You will need a follow up appointment in 12 months  Providers on your designated Care Team:   Murray Hodgkins, NP Christell Faith, PA-C Cadence Kathlen Mody, [place]  COVID-19 Vaccine Information can be found at: ShippingScam.co.uk For questions related to vaccine distribution or appointments, please email [email or web address] .[email or web address] or call [phone].

## 2022-08-02 DIAGNOSIS — R208 Other disturbances of skin sensation: Secondary | ICD-10-CM | POA: Diagnosis not present

## 2022-08-02 DIAGNOSIS — Z85828 Personal history of other malignant neoplasm of skin: Secondary | ICD-10-CM | POA: Diagnosis not present

## 2022-08-02 DIAGNOSIS — D225 Melanocytic nevi of trunk: Secondary | ICD-10-CM | POA: Diagnosis not present

## 2022-08-02 DIAGNOSIS — L570 Actinic keratosis: Secondary | ICD-10-CM | POA: Diagnosis not present

## 2022-08-02 DIAGNOSIS — D2261 Melanocytic nevi of right upper limb, including shoulder: Secondary | ICD-10-CM | POA: Diagnosis not present

## 2022-08-02 DIAGNOSIS — D0439 Carcinoma in situ of skin of other parts of face: Secondary | ICD-10-CM | POA: Diagnosis not present

## 2022-08-02 DIAGNOSIS — L821 Other seborrheic keratosis: Secondary | ICD-10-CM | POA: Diagnosis not present

## 2022-08-02 DIAGNOSIS — C44222 Squamous cell carcinoma of skin of right ear and external auricular canal: Secondary | ICD-10-CM | POA: Diagnosis not present

## 2022-08-02 DIAGNOSIS — D2262 Melanocytic nevi of left upper limb, including shoulder: Secondary | ICD-10-CM | POA: Diagnosis not present

## 2022-08-02 DIAGNOSIS — D485 Neoplasm of uncertain behavior of skin: Secondary | ICD-10-CM | POA: Diagnosis not present

## 2022-09-01 DIAGNOSIS — D2321 Other benign neoplasm of skin of right ear and external auricular canal: Secondary | ICD-10-CM | POA: Diagnosis not present

## 2022-09-01 DIAGNOSIS — C44222 Squamous cell carcinoma of skin of right ear and external auricular canal: Secondary | ICD-10-CM | POA: Diagnosis not present

## 2022-09-23 ENCOUNTER — Other Ambulatory Visit: Payer: Self-pay | Admitting: Cardiovascular Disease

## 2022-09-23 ENCOUNTER — Telehealth: Payer: Medicare PPO | Admitting: Physician Assistant

## 2022-09-23 ENCOUNTER — Other Ambulatory Visit: Payer: Self-pay | Admitting: Internal Medicine

## 2022-09-23 ENCOUNTER — Other Ambulatory Visit: Payer: Self-pay | Admitting: Cardiology

## 2022-09-23 DIAGNOSIS — N401 Enlarged prostate with lower urinary tract symptoms: Secondary | ICD-10-CM

## 2022-09-24 NOTE — Progress Notes (Signed)
Because this is not something we manage via e-visit, I feel your condition warrants further evaluation and I recommend that you be seen in a face to face visit. Please first contact your primary care provider and/or Urologist this morning to have this concern addressed.   NOTE: There will be NO CHARGE for this eVisit   If you are having a true medical emergency please call 911.      For an urgent face to face visit, [place] has eight urgent care centers for your convenience:   NEW!! [facility] Health Urgent Care Center at [facility] Get Driving Directions [phone] [address], [address] [place], [address]    [facility] Health [facility] Care Center at [facility] Get Driving Directions [phone] [address] [place], [place] [address]   [facility] Health Urgent Care Center [facility]) Get Driving Directions [phone] [address] [place], [place] [address]  [facility] Health Urgent Care Center [facility] [place]) Get Driving Directions [phone] [address] [place],  [place]  [address]  [facility] Health Urgent Care Center [facility] - at [employer]  [phone] [address] [place],  [place] [address]   [facility] Health Urgent Care at [facility] Get Driving Directions [phone] [address] [place], [place] [address]   [facility] Health Urgent Care at [facility] Get Driving Directions  [phone] [address] [place], [place] [address]   [facility] Health Urgent Care at [facility] Directions [phone] [address] [place], [place] [address]  Your MyChart E-visit questionnaire answers were reviewed by a board certified advanced clinical practitioner to complete your personal care plan based on your specific symptoms.  Thank you for using e-Visits.

## 2022-09-27 ENCOUNTER — Encounter: Payer: Self-pay | Admitting: Internal Medicine

## 2022-09-29 ENCOUNTER — Other Ambulatory Visit: Payer: Self-pay | Admitting: Internal Medicine

## 2022-09-30 ENCOUNTER — Encounter: Payer: Self-pay | Admitting: Internal Medicine

## 2022-09-30 ENCOUNTER — Ambulatory Visit: Payer: Medicare PPO | Admitting: Internal Medicine

## 2022-09-30 VITALS — BP 130/82 | HR 60 | Temp 97.0°F | Ht 71.0 in | Wt 181.0 lb

## 2022-09-30 DIAGNOSIS — N401 Enlarged prostate with lower urinary tract symptoms: Secondary | ICD-10-CM

## 2022-09-30 DIAGNOSIS — R338 Other retention of urine: Secondary | ICD-10-CM

## 2022-09-30 NOTE — Progress Notes (Signed)
Subjective:    Patient ID: Dustin Harrison, male    DOB: [birth year], 89 y.o.   MRN: [id number]  HPI Here due to ongoing problems with voiding  Troubled by nocturia Goes a lot during the day--but tolerates it better Slow stream Not really dribbling  No dysuria or blood Not sick  Tamsulosin 0.8mg  and finasteride  daily  Current Outpatient Medications on File Prior to Visit  Medication Sig Dispense Refill   amLODipine (NORVASC) 5 MG tablet TAKE ONE TABLET BY MOUTH TWICE DAILY 180 tablet 1   atorvastatin (LIPITOR) 20 MG tablet TAKE ONE TABLET BY MOUTH EVERY DAY 90 tablet 3   dorzolamide-timolol (COSOPT) 22.3-6.8 MG/ML ophthalmic solution 1 drop 2 (two) times daily.     ezetimibe (ZETIA) 10 MG tablet TAKE ONE TABLET BY MOUTH EVERY DAY 90 tablet 3   finasteride (PROPECIA) 1 MG tablet Take 1 tablet (1 mg total) by mouth daily. 90 tablet 3   JARDIANCE 10 MG TABS tablet TAKE ONE TABLET (10 MG) BY MOUTH EVERY DAY BEFORE BREAKFAST 90 tablet 3   latanoprost (XALATAN) 0.005 % ophthalmic solution Place 1 drop into both eyes at bedtime.     losartan (COZAAR) 100 MG tablet TAKE ONE TABLET BY MOUTH EVERY DAY 90 tablet 3   metFORMIN (GLUCOPHAGE) 500 MG tablet TAKE ONE TABLET BY MOUTH TWICE DAILY WITH A MEAL 180 tablet 3   metoprolol succinate (TOPROL-XL) 25 MG 24 hr tablet TAKE 1 TABLET BY MOUTH DAILY 30 tablet 3   pantoprazole (PROTONIX) 20 MG tablet TAKE 1 TABLET BY MOUTH DAILY 90 tablet 3   polyethylene glycol (MIRALAX / GLYCOLAX) 17 g packet Take 17 g by mouth daily.     tamsulosin (FLOMAX) 0.4 MG CAPS capsule TAKE 2 CAPSULES EVERY DAY 180 capsule 3   torsemide (DEMADEX) 20 MG tablet Take 1 tablet (20 mg total) by mouth as directed. Take 1 tablet on Monday Wednesday Friday with extra 1 tablet for swelling. 1 tablet 0   No current facility-administered medications on file prior to visit.    No Known Allergies  Past Medical History:  Diagnosis Date   Abnormal glucose    Arthritis     Baker's cyst of knee    left   BPH (benign prostatic hypertrophy)    Cough    because of "tight" esophagus   Diabetes mellitus    Glaucoma    HOH (hard of hearing)    Hypertension    Pheochromocytoma of right adrenal gland    Stroke    Ulcer    Wears hearing aid    bilateral    Past Surgical History:  Procedure Laterality Date   adrenaletomy     CARDIAC CATHETERIZATION     CATARACT EXTRACTION W/PHACO Left 10/08/2015   Procedure: CATARACT EXTRACTION PHACO AND INTRAOCULAR LENS PLACEMENT (IOC) left eye;  Surgeon: Lockie Mola, MD;  Location: [facility];  Service: Ophthalmology;  Laterality: Left;  MALYUGIN   HERNIA REPAIR     RIGHT/LEFT HEART CATH AND CORONARY ANGIOGRAPHY Left 11/28/2019   Procedure: RIGHT/LEFT HEART CATH AND CORONARY ANGIOGRAPHY;  Surgeon: Yvonne Kendall, MD;  Location: [facility];  Service: Cardiovascular;  Laterality: Left;   SQUAMOUS CELL CARCINOMA EXCISION  03-2006   left ear   TEE WITHOUT CARDIOVERSION N/A 10/12/2019   Procedure: TRANSESOPHAGEAL ECHOCARDIOGRAM (TEE);  Surgeon: Antonieta Iba, MD;  Location: [facility] ORS;  Service: Cardiovascular;  Laterality: N/A;   TONSILLECTOMY     XI ROBOTIC LAPAROSCOPIC ASSISTED  APPENDECTOMY N/A 10/11/2019   Procedure: XI ROBOTIC ASSISTED LAPAROSCOPIC INSERTION GASTROSTOMY TUBE;  Surgeon: Leafy Ro, MD;  Location: [facility] ORS;  Service: General;  Laterality: N/A;    Family History  Problem Relation Age of Onset   Alcohol abuse Mother    Coronary artery disease Mother    Brain cancer Father        tumor   Diabetes Brother     Social History   Socioeconomic History   Marital status: Married    Spouse name: Not on file   Number of children: Not on file   Years of education: Not on file   Highest education level: Not on file  Occupational History   Occupation: chaplain  Tobacco Use   Smoking status: Never    Passive exposure: Never   Smokeless tobacco: Never  Vaping Use   Vaping Use:  Never used  Substance and Sexual Activity   Alcohol use: Not Currently    Alcohol/week: 1.0 standard drink of alcohol    Types: 1 Glasses of wine per week    Comment: 1 glass per week   Drug use: No   Sexual activity: Not Currently  Other Topics Concern   Not on file  Social History Narrative   Regular exercise- yes- 3-4 times a week treadmill/walk   Diet: fruits and veggies,water   Living will, HCPOA (wife) , full code (reviewed 74)   Social Determinants of Health   Financial Resource Strain: Low Risk  (06/09/2021)   Overall Financial Resource Strain (CARDIA)    Difficulty of Paying Living Expenses: Not hard at all  Food Insecurity: No Food Insecurity (06/09/2021)   Hunger Vital Sign    Worried About Running Out of Food in the Last Year: Never true    Ran Out of Food in the Last Year: Never true  Transportation Needs: No Transportation Needs (06/09/2021)   PRAPARE - Administrator, Civil Service (Medical): No    Lack of Transportation (Non-Medical): No  Physical Activity: Insufficiently Active (06/09/2021)   Exercise Vital Sign    Days of Exercise per Week: 2 days    Minutes of Exercise per Session: 60 min  Stress: No Stress Concern Present (06/09/2021)   [employer] of Occupational Health - Occupational Stress Questionnaire    Feeling of Stress : Not at all  Social Connections: Socially Integrated (06/09/2021)   Social Connection and Isolation Panel [NHANES]    Frequency of Communication with Friends and Family: More than three times a week    Frequency of Social Gatherings with Friends and Family: Three times a week    Attends Religious Services: More than 4 times per year    Active Member of Clubs or Organizations: Yes    Attends Banker Meetings: More than 4 times per year    Marital Status: Married  Catering manager Violence: Not At Risk (06/09/2021)   Humiliation, Afraid, Rape, and Kick questionnaire    Fear of Current or Ex-Partner: No     Emotionally Abused: No    Physically Abused: No    Sexually Abused: No   Review of Systems Some daytime somnolence Does have 30 minute naps after breakfast and lunch     Objective:   Physical Exam Constitutional:      Appearance: Normal appearance.  Abdominal:     Palpations: Abdomen is soft.     Tenderness: There is no abdominal tenderness.     Comments: Seems to have significant suprapubic dullness  Neurological:     Mental Status: He is alert.            Assessment & Plan:

## 2022-09-30 NOTE — Assessment & Plan Note (Addendum)
Ongoing symptoms despite maximal medical therapy (though he wonders about changing the meds) I am concerned about urinary retention at this point--so even though he doesn't want surgery----he should have urologic evaluation and consider procedure if considerable retention (due to risk of  obstructive kidney failure or infection)  Also asks about Rx for ED---I mentioned that cialis can be used for BPH also---but I think he should see the urologist first before we consider this

## 2022-10-15 ENCOUNTER — Ambulatory Visit: Payer: Self-pay | Admitting: Urology

## 2022-10-25 ENCOUNTER — Other Ambulatory Visit: Payer: Self-pay | Admitting: Cardiovascular Disease

## 2022-11-03 DIAGNOSIS — D0439 Carcinoma in situ of skin of other parts of face: Secondary | ICD-10-CM | POA: Diagnosis not present

## 2022-11-03 DIAGNOSIS — H903 Sensorineural hearing loss, bilateral: Secondary | ICD-10-CM | POA: Diagnosis not present

## 2022-11-10 DIAGNOSIS — C44229 Squamous cell carcinoma of skin of left ear and external auricular canal: Secondary | ICD-10-CM | POA: Diagnosis not present

## 2022-11-10 DIAGNOSIS — D485 Neoplasm of uncertain behavior of skin: Secondary | ICD-10-CM | POA: Diagnosis not present

## 2022-11-11 ENCOUNTER — Encounter: Payer: Self-pay | Admitting: Urology

## 2022-11-11 ENCOUNTER — Ambulatory Visit: Payer: Medicare PPO | Admitting: Urology

## 2022-11-11 VITALS — BP 146/66 | HR 89 | Ht 71.0 in | Wt 175.4 lb

## 2022-11-11 DIAGNOSIS — R351 Nocturia: Secondary | ICD-10-CM | POA: Diagnosis not present

## 2022-11-11 DIAGNOSIS — R339 Retention of urine, unspecified: Secondary | ICD-10-CM | POA: Diagnosis not present

## 2022-11-11 DIAGNOSIS — N401 Enlarged prostate with lower urinary tract symptoms: Secondary | ICD-10-CM | POA: Diagnosis not present

## 2022-11-11 LAB — BLADDER SCAN AMB NON-IMAGING: Scan Result: 238

## 2022-11-11 MED ORDER — GEMTESA 75 MG PO TABS: 75.0000 mg | ORAL_TABLET | Freq: Every day | ORAL | 0 refills | Status: DC

## 2022-11-11 NOTE — Progress Notes (Signed)
[name],acting as a scribe for Riki Altes, MD.,have documented all relevant documentation on the behalf of Riki Altes, MD,as directed by  Riki Altes, MD while in the presence of Riki Altes, MD.  11/11/2022 3:46 PM   Su Ley Laural Benes 12/08/1931 [id number]  Referring provider: Karie Schwalbe, MD [address] [place],  [place] [address]  Chief Complaint  Patient presents with   Benign Prostatic Hypertrophy    HPI: Damontre Hinerman is a 89 y.o. male who presents today for evaluation of BPH with LUTS.  Several year history of lower urinary tract symptoms  He has been on both finasteride and tamsulosin for many years. Presently taking 0.8 mg daily of tamsulosin. His most bothersome symptom is nocturia x4, which is interrupting his sleep pattern. Denies history of snoring or sleep apnea. No dysuria or gross hematuria IPSS today 30/35 with his most bothersome symptoms being frequency, intermittent stream, nocturia  PMH: Past Medical History:  Diagnosis Date   Abnormal glucose    Arthritis    Baker's cyst of knee    left   BPH (benign prostatic hypertrophy)    Cough    because of "tight" esophagus   Diabetes mellitus    Glaucoma    HOH (hard of hearing)    Hypertension    Pheochromocytoma of right adrenal gland    Stroke [facility])    Ulcer    Wears hearing aid    bilateral    Surgical History: Past Surgical History:  Procedure Laterality Date   adrenaletomy     CARDIAC CATHETERIZATION     CATARACT EXTRACTION W/PHACO Left 10/08/2015   Procedure: CATARACT EXTRACTION PHACO AND INTRAOCULAR LENS PLACEMENT (IOC) left eye;  Surgeon: Lockie Mola, MD;  Location: [facility];  Service: Ophthalmology;  Laterality: Left;  MALYUGIN   HERNIA REPAIR     RIGHT/LEFT HEART CATH AND CORONARY ANGIOGRAPHY Left 11/28/2019   Procedure: RIGHT/LEFT HEART CATH AND CORONARY ANGIOGRAPHY;  Surgeon: Yvonne Kendall, MD;  Location: [facility]  [facility];  Service: Cardiovascular;  Laterality: Left;   SQUAMOUS CELL CARCINOMA EXCISION  03-2006   left ear   TEE WITHOUT CARDIOVERSION N/A 10/12/2019   Procedure: TRANSESOPHAGEAL ECHOCARDIOGRAM (TEE);  Surgeon: Antonieta Iba, MD;  Location: [facility] ORS;  Service: Cardiovascular;  Laterality: N/A;   TONSILLECTOMY     XI ROBOTIC LAPAROSCOPIC ASSISTED APPENDECTOMY N/A 10/11/2019   Procedure: XI ROBOTIC ASSISTED LAPAROSCOPIC INSERTION GASTROSTOMY TUBE;  Surgeon: Leafy Ro, MD;  Location: [facility] OR[facility];  Service: General;  Laterality: N/A;    Home Medications:  Allergies as of 11/11/2022   No Known Allergies      Medication List        Accurate as of November 11, 2022  3:46 PM. If you have any questions, ask your nurse or doctor.          amLODipine 5 MG tablet Commonly known as: NORVASC TAKE ONE TABLET BY MOUTH TWICE DAILY   atorvastatin 20 MG tablet Commonly known as: LIPITOR TAKE ONE TABLET BY MOUTH EVERY DAY   dorzolamide-timolol 2-0.5 % ophthalmic solution Commonly known as: COSOPT 1 drop 2 (two) times daily.   ezetimibe 10 MG tablet Commonly known as: ZETIA TAKE ONE TABLET BY MOUTH EVERY DAY   finasteride 1 MG tablet Commonly known as: PROPECIA Take 1 tablet (1 mg total) by mouth daily.   Jardiance 10 MG Tabs tablet Generic drug: empagliflozin TAKE ONE TABLET (10 MG) BY  MOUTH EVERY DAY BEFORE BREAKFAST   latanoprost 0.005 % ophthalmic solution Commonly known as: XALATAN Place 1 drop into both eyes at bedtime.   losartan 100 MG tablet Commonly known as: COZAAR TAKE ONE TABLET BY MOUTH EVERY DAY   metFORMIN 500 MG tablet Commonly known as: GLUCOPHAGE TAKE ONE TABLET BY MOUTH TWICE DAILY WITH A MEAL   metoprolol succinate 25 MG 24 hr tablet Commonly known as: TOPROL-XL TAKE 1 TABLET BY MOUTH DAILY   pantoprazole 20 MG tablet Commonly known as: PROTONIX TAKE 1 TABLET BY MOUTH DAILY   polyethylene glycol 17 g packet Commonly known as: MIRALAX / GLYCOLAX Take 17  g by mouth daily.   tamsulosin 0.4 MG Caps capsule Commonly known as: FLOMAX TAKE 2 CAPSULES EVERY DAY   torsemide 20 MG tablet Commonly known as: DEMADEX Take 1 tablet (20 mg total) by mouth as directed. Take 1 tablet on Monday Wednesday Friday with extra 1 tablet for swelling.        Family History: Family History  Problem Relation Age of Onset   Alcohol abuse Mother    Coronary artery disease Mother    Brain cancer Father        tumor   Diabetes Brother     Social History:  reports that he has never smoked. He has never been exposed to tobacco smoke. He has never used smokeless tobacco. He reports that he does not currently use alcohol after a past usage of about 1.0 standard drink of alcohol per week. He reports that he does not use drugs.   Physical Exam: BP (!) 146/66   Pulse 89   Ht 5\' 11"  (1.803 m)   Wt 175 lb 6.4 oz (79.6 kg)   BMI 24.46 kg/m   Constitutional:  Alert and oriented, No acute distress. HEENT: [place] AT Respiratory: Normal respiratory effort, no increased work of breathing. GU: Prostate 40 grams, smooth without nodules Psychiatric: Normal mood and affect.  Assessment & Plan:    1. BPH with LUTS PVR today 238 mL Both obstructive and storage related voiding symptoms Nocturia is his most bothersome symptom. Will add Gemtesa 75 mg daily- samples given PA follow up in 1 month for symptom recheck and repeat PVR If no significant change in his nocturia on follow up, consider a sleep study He does have obstructive voiding symptoms and an outlet surgery may improve symptoms. However, nocturia is his most bothersome symptom.   I have reviewed the above documentation for accuracy and completeness, and I agree with the above.   Riki Altes, MD  [facility] [address], [address] [place], [place] [address] [phone]

## 2022-11-12 LAB — URINALYSIS, COMPLETE
Bilirubin, UA: NEGATIVE
Ketones, UA: NEGATIVE
Leukocytes,UA: NEGATIVE
Nitrite, UA: NEGATIVE
RBC, UA: NEGATIVE
Specific Gravity, UA: 1.015 (ref 1.005–1.030)
Urobilinogen, Ur: 0.2 mg/dL (ref 0.2–1.0)
pH, UA: 5.5 (ref 5.0–7.5)

## 2022-11-12 LAB — MICROSCOPIC EXAMINATION
Bacteria, UA: NONE SEEN
RBC, Urine: NONE SEEN /hpf (ref 0–2)

## 2022-11-19 ENCOUNTER — Encounter: Payer: Self-pay | Admitting: Urology

## 2022-11-21 ENCOUNTER — Other Ambulatory Visit: Payer: Self-pay

## 2022-11-21 ENCOUNTER — Emergency Department
Admission: EM | Admit: 2022-11-21 | Discharge: 2022-11-21 | Disposition: A | Discharge status: Home / Self Care | Payer: Medicare PPO | Attending: Emergency Medicine | Admitting: Emergency Medicine

## 2022-11-21 ENCOUNTER — Encounter: Payer: Self-pay | Admitting: Emergency Medicine

## 2022-11-21 ENCOUNTER — Emergency Department: Payer: Medicare PPO

## 2022-11-21 DIAGNOSIS — I509 Heart failure, unspecified: Secondary | ICD-10-CM | POA: Diagnosis not present

## 2022-11-21 DIAGNOSIS — E1122 Type 2 diabetes mellitus with diabetic chronic kidney disease: Secondary | ICD-10-CM | POA: Diagnosis not present

## 2022-11-21 DIAGNOSIS — R0689 Other abnormalities of breathing: Secondary | ICD-10-CM | POA: Diagnosis not present

## 2022-11-21 DIAGNOSIS — R569 Unspecified convulsions: Secondary | ICD-10-CM | POA: Diagnosis not present

## 2022-11-21 DIAGNOSIS — N189 Chronic kidney disease, unspecified: Secondary | ICD-10-CM | POA: Insufficient documentation

## 2022-11-21 DIAGNOSIS — E11649 Type 2 diabetes mellitus with hypoglycemia without coma: Secondary | ICD-10-CM | POA: Insufficient documentation

## 2022-11-21 DIAGNOSIS — I491 Atrial premature depolarization: Secondary | ICD-10-CM | POA: Diagnosis not present

## 2022-11-21 DIAGNOSIS — R739 Hyperglycemia, unspecified: Secondary | ICD-10-CM | POA: Diagnosis not present

## 2022-11-21 DIAGNOSIS — R55 Syncope and collapse: Secondary | ICD-10-CM | POA: Diagnosis not present

## 2022-11-21 DIAGNOSIS — E162 Hypoglycemia, unspecified: Secondary | ICD-10-CM

## 2022-11-21 DIAGNOSIS — I130 Hypertensive heart and chronic kidney disease with heart failure and stage 1 through stage 4 chronic kidney disease, or unspecified chronic kidney disease: Secondary | ICD-10-CM | POA: Diagnosis not present

## 2022-11-21 DIAGNOSIS — R531 Weakness: Secondary | ICD-10-CM | POA: Diagnosis not present

## 2022-11-21 LAB — CBC WITH DIFFERENTIAL/PLATELET
Abs Immature Granulocytes: 0.03 10*3/uL (ref 0.00–0.07)
Basophils Absolute: 0 10*3/uL (ref 0.0–0.1)
Basophils Relative: 0 %
Eosinophils Absolute: 0 10*3/uL (ref 0.0–0.5)
Eosinophils Relative: 0 %
HCT: 41.5 % (ref 39.0–52.0)
Hemoglobin: 13.5 g/dL (ref 13.0–17.0)
Immature Granulocytes: 0 %
Lymphocytes Relative: 8 %
Lymphs Abs: 0.7 10*3/uL (ref 0.7–4.0)
MCH: 31 pg (ref 26.0–34.0)
MCHC: 32.5 g/dL (ref 30.0–36.0)
MCV: 95.4 fL (ref 80.0–100.0)
Monocytes Absolute: 0.8 10*3/uL (ref 0.1–1.0)
Monocytes Relative: 10 %
Neutro Abs: 6.8 10*3/uL (ref 1.7–7.7)
Neutrophils Relative %: 82 %
Platelets: 302 10*3/uL (ref 150–400)
RBC: 4.35 MIL/uL (ref 4.22–5.81)
RDW: 13.9 % (ref 11.5–15.5)
WBC: 8.4 10*3/uL (ref 4.0–10.5)
nRBC: 0 % (ref 0.0–0.2)

## 2022-11-21 LAB — COMPREHENSIVE METABOLIC PANEL
ALT: 28 U/L (ref 0–44)
AST: 33 U/L (ref 15–41)
Albumin: 4.2 g/dL (ref 3.5–5.0)
Alkaline Phosphatase: 59 U/L (ref 38–126)
Anion gap: 11 (ref 5–15)
BUN: 46 mg/dL — ABNORMAL HIGH (ref 8–23)
CO2: 23 mmol/L (ref 22–32)
Calcium: 9.3 mg/dL (ref 8.9–10.3)
Chloride: 104 mmol/L (ref 98–111)
Creatinine, Ser: 1.26 mg/dL — ABNORMAL HIGH (ref 0.61–1.24)
GFR, Estimated: 55 mL/min — ABNORMAL LOW (ref >60)
Glucose, Bld: 47 mg/dL — ABNORMAL LOW (ref 70–99)
Potassium: 3.5 mmol/L (ref 3.5–5.1)
Sodium: 138 mmol/L (ref 135–145)
Total Bilirubin: 0.9 mg/dL (ref 0.3–1.2)
Total Protein: 7.3 g/dL (ref 6.5–8.1)

## 2022-11-21 LAB — CBG MONITORING, ED
Glucose-Capillary: 41 mg/dL — CL (ref 70–99)
Glucose-Capillary: 118 mg/dL — ABNORMAL HIGH (ref 70–99)
Glucose-Capillary: 122 mg/dL — ABNORMAL HIGH (ref 70–99)

## 2022-11-21 LAB — URINALYSIS, ROUTINE W REFLEX MICROSCOPIC
Bacteria, UA: NONE SEEN
Bilirubin Urine: NEGATIVE
Glucose, UA: >=500 mg/dL — AB
Hgb urine dipstick: NEGATIVE
Ketones, ur: NEGATIVE mg/dL
Leukocytes,Ua: NEGATIVE
Nitrite: NEGATIVE
Protein, ur: 30 mg/dL — AB
Specific Gravity, Urine: 1.016 (ref 1.005–1.030)
Squamous Epithelial / HPF: NONE SEEN /HPF (ref 0–5)
pH: 6 (ref 5.0–8.0)

## 2022-11-21 LAB — MAGNESIUM: Magnesium: 2.4 mg/dL (ref 1.7–2.4)

## 2022-11-21 MED ORDER — DEXTROSE 50 % IV SOLN: 50.0000 mL | Freq: Once | INTRAVENOUS | Status: AC

## 2022-11-21 MED ORDER — DEXTROSE 50 % IV SOLN
INTRAVENOUS | Status: AC
  Administered 2022-11-21: 50 mL via INTRAVENOUS
  Filled 2022-11-21: qty 50

## 2022-11-21 NOTE — ED Notes (Signed)
Dr. Roxan Hockey, EDP at bedside for evaluation.

## 2022-11-21 NOTE — Discharge Instructions (Signed)
I recommend that you stop taking Jardiance and follow-up with Dr. Alphonsus Sias.  Patient to drink any fluids.  I would recommend that you check your blood sugar 3 times daily for the next 3 days to make sure that you are not having any major swings in her blood pressure particularly low readings.  Return for any additional questions or concerns.

## 2022-11-21 NOTE — ED Provider Notes (Signed)
[facility] Provider Note    Event Date/Time   First MD Initiated Contact with Patient 11/21/22 1424     (approximate)   History   Chief Complaint Hypoglycemia   HPI  Dustin Harrison is a 89 y.o. male with past medical history of hypertension, hyperlipidemia, diabetes, CHF, CKD, and stroke who presents to the ED for hypoglycemia.  Per family, patient had eaten breakfast and was acting normally, had not yet eaten lunch and was celebrating Father's Day with family when he began to say that he felt dizzy.  He went inside to lay down, but when family went to check on him they said that he was difficult to arouse.  EMS arrived and found patient's blood sugar to be in the 30s.  He was given 250 cc of D10 with improvement into the 150s with EMS, arrives awake and alert but extremely hard of hearing and having difficulty answering questions.  He currently states that he "feels fine" and denies any complaints.  He is not sure why he is here in the hospital.     Physical Exam   Triage Vital Signs: ED Triage Vitals  Enc Vitals Group     BP 11/21/22 1429 104/70     Pulse Rate 11/21/22 1429 69     Resp 11/21/22 1429 18     Temp --      Temp src --      SpO2 11/21/22 1429 97 %     Weight 11/21/22 1428 176 lb 5.9 oz (80 kg)     Height 11/21/22 1428 5\' 11"  (1.803 m)     Head Circumference --      Peak Flow --      Pain Score 11/21/22 1427 0     Pain Loc --      Pain Edu? --      Excl. in GC? --     Most recent vital signs: Vitals:   11/21/22 1429 11/21/22 1430  BP: 104/70 129/71  Pulse: 69 64  Resp: 18 19  SpO2: 97% 98%    Constitutional: Alert and oriented. Eyes: Conjunctivae are normal. Head: Atraumatic. Nose: No congestion/rhinnorhea. Mouth/Throat: Mucous membranes are moist.  Cardiovascular: Normal rate, regular rhythm. Grossly normal heart sounds.  2+ radial pulses bilaterally. Respiratory: Normal respiratory effort.  No retractions. Lungs  CTAB. Gastrointestinal: Soft and nontender. No distention. Musculoskeletal: No lower extremity tenderness nor edema.  Neurologic:  Normal speech and language. No gross focal neurologic deficits are appreciated.    ED Results / Procedures / Treatments   Labs (all labs ordered are listed, but only abnormal results are displayed) Labs Reviewed  COMPREHENSIVE METABOLIC PANEL - Abnormal; Notable for the following components:      Result Value   Glucose, Bld 47 (*)    BUN 46 (*)    Creatinine, Ser 1.26 (*)    GFR, Estimated 55 (*)    All other components within normal limits  CBG MONITORING, ED - Abnormal; Notable for the following components:   Glucose-Capillary 41 (*)    All other components within normal limits  CBC WITH DIFFERENTIAL/PLATELET  MAGNESIUM  URINALYSIS, ROUTINE W REFLEX MICROSCOPIC     EKG  ED ECG REPORT I, Chesley Noon, the attending physician, personally viewed and interpreted this ECG.   Date: 11/21/2022  EKG Time: 14:25  Rate: 63  Rhythm: normal sinus rhythm  Axis: Normal  Intervals:none  ST&T Change: None   PROCEDURES:  Critical Care performed: No  Procedures   MEDICATIONS ORDERED IN ED: Medications  dextrose 50 % solution 50 mL (50 mLs Intravenous Given 11/21/22 1432)     IMPRESSION / MDM / ASSESSMENT AND PLAN / ED COURSE  I reviewed the triage vital signs and the nursing notes.                              89 y.o. male with past medical history of hypertension, diabetes, hyperlipidemia, CHF, CKD, and stroke who presents to the ED for episode of hypoglycemia at home.  Patient's presentation is most consistent with acute presentation with potential threat to life or bodily function.  Differential diagnosis includes, but is not limited to, poor oral intake, sepsis, UTI, pneumonia, electrolyte abnormality, AKI.  Patient nontoxic-appearing and in no acute distress, vital signs unremarkable.  EKG shows no evidence of arrhythmia or ischemia  and patient currently denies any symptoms, low suspicion for sepsis.  It appears he only takes Jardiance and metformin for his diabetes, no sulfonylurea or insulin.  We will screen for infectious process with chest x-ray and urinalysis, labs are reassuring with no significant anemia, leukocytosis, tract abnormality, or AKI.  LFTs are unremarkable, patient did drop glucose level for a second time, was given an amp of D50 and something to eat.  Patient turned over to oncoming rider pending additional results and recheck of glucose.      FINAL CLINICAL IMPRESSION(S) / ED DIAGNOSES   Final diagnoses:  Hypoglycemia     Rx / DC Orders   ED Discharge Orders     None        Note:  This document was prepared using Dragon voice recognition software and may include unintentional dictation errors.   Chesley Noon, MD 11/21/22 1520

## 2022-11-21 NOTE — ED Notes (Addendum)
Pt asking for another cup of OJ at this time.

## 2022-11-21 NOTE — ED Triage Notes (Signed)
Pt via [facility] from home. Per family, pt had breakfast and lunch today. Pt went to lay down and family found him unresponsive. EMS reports, unresponsiveness and agonal respirations. EMS report an initial CBG of 31, EMS gave of D10 and CBG came up to 155. On arrival, pt alert but only oriented to self, which per family is his baseline.  CBG on arrival to ED is 41. 1 amp of D50 and sandwich tray with OJ given.

## 2022-11-21 NOTE — ED Notes (Signed)
Pt walked back to bed from bathroom unassisted. Pt steady on feet. Placed back on monitor.

## 2022-11-21 NOTE — ED Provider Notes (Signed)
Patient received in signout from Dr. Larinda Buttery pending further observation after episodes of hypoglycemia.  Uncertain etiology.  Patient's blood work is reassuring.  Repeat CBG is here after tolerating p.o. have been normal.  No sign of DKA.  He is well and denies any symptoms at this time.  Discussed option for admission to the hospital for further observation versus discharge home.  Patient would like to be discharged states that he feels well and will agree to not take his Jardiance until following up with his PCP.  His wife is a diabetic and they have glucose monitoring strips and will check sugars at home to make sure that they are stable.  They agree to return of he starts having any low trending blood sugars.  They demonstrate understanding of how to treat low blood sugar as well.   Willy Eddy, MD 11/21/22 1736

## 2022-11-22 ENCOUNTER — Encounter: Payer: Self-pay | Admitting: Internal Medicine

## 2022-11-22 DIAGNOSIS — H903 Sensorineural hearing loss, bilateral: Secondary | ICD-10-CM | POA: Diagnosis not present

## 2022-11-23 ENCOUNTER — Encounter: Payer: Self-pay | Admitting: Internal Medicine

## 2022-11-23 ENCOUNTER — Ambulatory Visit: Payer: Medicare PPO | Admitting: Internal Medicine

## 2022-11-23 VITALS — BP 108/60 | HR 60 | Temp 97.8°F | Ht 71.0 in | Wt 174.0 lb

## 2022-11-23 DIAGNOSIS — Z7984 Long term (current) use of oral hypoglycemic drugs: Secondary | ICD-10-CM | POA: Diagnosis not present

## 2022-11-23 DIAGNOSIS — E11649 Type 2 diabetes mellitus with hypoglycemia without coma: Secondary | ICD-10-CM | POA: Diagnosis not present

## 2022-11-23 DIAGNOSIS — I5020 Unspecified systolic (congestive) heart failure: Secondary | ICD-10-CM

## 2022-11-23 DIAGNOSIS — E1159 Type 2 diabetes mellitus with other circulatory complications: Secondary | ICD-10-CM

## 2022-11-23 NOTE — Patient Instructions (Signed)
Please discontinue the jardiance and the metformin. Check your sugar first thing in the morning for the next few days---then twice a week--and at least once a week for a while. Let me know if there is persistent elevation of your sugars (over 140-150)---we will restart the jardiance.

## 2022-11-23 NOTE — Assessment & Plan Note (Signed)
Compensated on torsemide 20mg  three days a week, metoprolol 25, losartan 100mg   If sugars run up again--will restart jardiance (instead of metformin) given the reduction in cardiac risk

## 2022-11-23 NOTE — Assessment & Plan Note (Signed)
Will stop his meds for now Urged him to check sugars daily for the next few days--then twice a week If persistent elevation over 140-150 fasting-----would restart the jardiance

## 2022-11-23 NOTE — Progress Notes (Signed)
Subjective:    Patient ID: Dustin Harrison, male    DOB: 1931/08/29, 89 y.o.   MRN: [id number]  HPI Here with daughter, [name] for ER follow up  2 days ago---about 1:15PM Had normal breakfast Getting ready to leave for Father's Day lunch Stood but didn't move--and was drooling Noted severe dizziness Went back down---then lied on bed Was unresponsive---jerking in body and eyes EMS came--BP 200, sugar 31 Got IV dextrose---sugar back up over 100---but then back down to 47 in ER  Has had recurrent dizzy spells Usually after eating lunch  Reviewed ER record No worrisome findings [place] Pepper was stopped Programmer, multimedia from [facility] came by yesterday--checked sugar (was 83 or 183)  No more dizziness  No chest pain No SOB No edema on 3 days per week torsemide  Current Outpatient Medications on File Prior to Visit  Medication Sig Dispense Refill   amLODipine (NORVASC) 5 MG tablet TAKE ONE TABLET BY MOUTH TWICE DAILY 180 tablet 1   atorvastatin (LIPITOR) 20 MG tablet TAKE ONE TABLET BY MOUTH EVERY DAY 90 tablet 3   dorzolamide-timolol (COSOPT) 22.3-6.8 MG/ML ophthalmic solution 1 drop 2 (two) times daily.     ezetimibe (ZETIA) 10 MG tablet TAKE ONE TABLET BY MOUTH EVERY DAY 90 tablet 3   finasteride (PROPECIA) 1 MG tablet Take 1 tablet (1 mg total) by mouth daily. 90 tablet 3   JARDIANCE 10 MG TABS tablet TAKE ONE TABLET (10 MG) BY MOUTH EVERY DAY BEFORE BREAKFAST 90 tablet 3   latanoprost (XALATAN) 0.005 % ophthalmic solution Place 1 drop into both eyes at bedtime.     losartan (COZAAR) 100 MG tablet TAKE ONE TABLET BY MOUTH EVERY DAY 90 tablet 3   metFORMIN (GLUCOPHAGE) 500 MG tablet TAKE ONE TABLET BY MOUTH TWICE DAILY WITH A MEAL 180 tablet 3   metoprolol succinate (TOPROL-XL) 25 MG 24 hr tablet TAKE 1 TABLET BY MOUTH DAILY 30 tablet 5   pantoprazole (PROTONIX) 20 MG tablet TAKE 1 TABLET BY MOUTH DAILY 90 tablet 3   polyethylene glycol (MIRALAX / GLYCOLAX) 17 g packet Take 17 g by  mouth daily.     tamsulosin (FLOMAX) 0.4 MG CAPS capsule TAKE 2 CAPSULES EVERY DAY 180 capsule 3   torsemide (DEMADEX) 20 MG tablet Take 1 tablet (20 mg total) by mouth as directed. Take 1 tablet on Monday Wednesday Friday with extra 1 tablet for swelling. 1 tablet 0   No current facility-administered medications on file prior to visit.    No Known Allergies  Past Medical History:  Diagnosis Date   Abnormal glucose    Arthritis    Baker's cyst of knee    left   BPH (benign prostatic hypertrophy)    Cough    because of "tight" esophagus   Diabetes mellitus    Glaucoma    HOH (hard of hearing)    Hypertension    Pheochromocytoma of right adrenal gland    Stroke [facility])    Ulcer    Wears hearing aid    bilateral    Past Surgical History:  Procedure Laterality Date   adrenaletomy     CARDIAC CATHETERIZATION     CATARACT EXTRACTION W/PHACO Left 10/08/2015   Procedure: CATARACT EXTRACTION PHACO AND INTRAOCULAR LENS PLACEMENT (IOC) left eye;  Surgeon: Lockie Mola, MD;  Location: [facility];  Service: Ophthalmology;  Laterality: Left;  MALYUGIN   HERNIA REPAIR     RIGHT/LEFT HEART CATH AND CORONARY ANGIOGRAPHY Left 11/28/2019  Procedure: RIGHT/LEFT HEART CATH AND CORONARY ANGIOGRAPHY;  Surgeon: Yvonne Kendall, MD;  Location: [facility] INVASIVE CV LAB;  Service: Cardiovascular;  Laterality: Left;   SQUAMOUS CELL CARCINOMA EXCISION  03-2006   left ear   TEE WITHOUT CARDIOVERSION N/A 10/12/2019   Procedure: TRANSESOPHAGEAL ECHOCARDIOGRAM (TEE);  Surgeon: Antonieta Iba, MD;  Location: [facility] ORS;  Service: Cardiovascular;  Laterality: N/A;   TONSILLECTOMY     XI ROBOTIC LAPAROSCOPIC ASSISTED APPENDECTOMY N/A 10/11/2019   Procedure: XI ROBOTIC ASSISTED LAPAROSCOPIC INSERTION GASTROSTOMY TUBE;  Surgeon: Leafy Ro, MD;  Location: [facility] ORS;  Service: General;  Laterality: N/A;    Family History  Problem Relation Age of Onset   Alcohol abuse Mother    Coronary artery  disease Mother    Brain cancer Father        tumor   Diabetes Brother     Social History   Socioeconomic History   Marital status: Married    Spouse name: Not on file   Number of children: Not on file   Years of education: Not on file   Highest education level: Not on file  Occupational History   Occupation: chaplain  Tobacco Use   Smoking status: Never    Passive exposure: Never   Smokeless tobacco: Never  Vaping Use   Vaping Use: Never used  Substance and Sexual Activity   Alcohol use: Not Currently    Alcohol/week: 1.0 standard drink of alcohol    Types: 1 Glasses of wine per week    Comment: 1 glass per week   Drug use: No   Sexual activity: Not Currently  Other Topics Concern   Not on file  Social History Narrative   Regular exercise- yes- 3-4 times a week treadmill/walk   Diet: fruits and veggies,water   Living will, HCPOA (wife) , full code (reviewed 29)   Social Determinants of Health   Financial Resource Strain: Low Risk  (06/09/2021)   Overall Financial Resource Strain (CARDIA)    Difficulty of Paying Living Expenses: Not hard at all  Food Insecurity: No Food Insecurity (06/09/2021)   Hunger Vital Sign    Worried About Running Out of Food in the Last Year: Never true    Ran Out of Food in the Last Year: Never true  Transportation Needs: No Transportation Needs (06/09/2021)   PRAPARE - Administrator, Civil Service (Medical): No    Lack of Transportation (Non-Medical): No  Physical Activity: Insufficiently Active (06/09/2021)   Exercise Vital Sign    Days of Exercise per Week: 2 days    Minutes of Exercise per Session: 60 min  Stress: No Stress Concern Present (06/09/2021)   [employer] of Occupational Health - Occupational Stress Questionnaire    Feeling of Stress : Not at all  Social Connections: Socially Integrated (06/09/2021)   Social Connection and Isolation Panel [NHANES]    Frequency of Communication with Friends and Family: More than  three times a week    Frequency of Social Gatherings with Friends and Family: Three times a week    Attends Religious Services: More than 4 times per year    Active Member of Clubs or Organizations: Yes    Attends Banker Meetings: More than 4 times per year    Marital Status: Married  Catering manager Violence: Not At Risk (06/09/2021)   Humiliation, Afraid, Rape, and Kick questionnaire    Fear of Current or Ex-Partner: No    Emotionally Abused: No  Physically Abused: No    Sexually Abused: No   Review of Systems No fever Not feeling sick    Objective:   Physical Exam Constitutional:      Appearance: Normal appearance.  Cardiovascular:     Rate and Rhythm: Normal rate and regular rhythm.     Heart sounds:     No gallop.     Comments: Gr 3/6 coarse systolic murmur Pulmonary:     Effort: Pulmonary effort is normal.     Breath sounds: Normal breath sounds. No wheezing or rales.  Musculoskeletal:     Cervical back: Neck supple.     Right lower leg: No edema.     Left lower leg: No edema.  Lymphadenopathy:     Cervical: No cervical adenopathy.  Neurological:     Mental Status: He is alert.            Assessment & Plan:

## 2022-11-23 NOTE — Assessment & Plan Note (Signed)
Severe spell and persistent even after D10 by EMS This is unusual on metformin or jardiance Given his mild diabetes--will stop both medications for now

## 2022-11-25 ENCOUNTER — Telehealth: Payer: Self-pay

## 2022-11-25 NOTE — Telephone Encounter (Signed)
Transition Care Management Unsuccessful Follow-up Telephone Call  Date of discharge and from where:  [facility] 6/16  Attempts:  1st Attempt  Reason for unsuccessful TCM follow-up call:  No answer/busy   Lenard Forth [facility] Guide, [facility] [phone] [address] [place], [place], [place] [address] Phone: [phone] Email: [email or web address]

## 2022-11-26 ENCOUNTER — Encounter: Payer: Self-pay | Admitting: Internal Medicine

## 2022-11-26 ENCOUNTER — Telehealth: Payer: Self-pay

## 2022-11-26 MED ORDER — TADALAFIL 5 MG PO TABS: 5.0000 mg | ORAL_TABLET | Freq: Every day | ORAL | 11 refills | Status: DC

## 2022-11-26 NOTE — Telephone Encounter (Signed)
Transition Care Management Unsuccessful Follow-up Telephone Call  Date of discharge and from where:  [facility] 6/16  Attempts:  2nd Attempt  Reason for unsuccessful TCM follow-up call:  Left voice message   Lenard Forth [facility] Guide, [facility] [phone] [address] [place], [place], [place] [address] Phone: [phone] Email: [email or web address]

## 2022-12-08 DIAGNOSIS — H903 Sensorineural hearing loss, bilateral: Secondary | ICD-10-CM | POA: Diagnosis not present

## 2022-12-15 ENCOUNTER — Ambulatory Visit: Payer: Medicare PPO | Admitting: Urology

## 2022-12-20 MED ORDER — LANCETS MISC: 1.0000 | Freq: Every day | 3 refills | Status: AC

## 2022-12-20 MED ORDER — BENZONATATE 200 MG PO CAPS: 200.0000 mg | ORAL_CAPSULE | Freq: Three times a day (TID) | ORAL | 0 refills | Status: DC | PRN

## 2022-12-20 NOTE — Telephone Encounter (Signed)
Lancets rx sent to [facility]. Sending to Dr Alphonsus Sias about the Suezanne Cheshire  request.

## 2022-12-21 DIAGNOSIS — Z961 Presence of intraocular lens: Secondary | ICD-10-CM | POA: Diagnosis not present

## 2022-12-21 DIAGNOSIS — H401132 Primary open-angle glaucoma, bilateral, moderate stage: Secondary | ICD-10-CM | POA: Diagnosis not present

## 2022-12-23 ENCOUNTER — Other Ambulatory Visit: Payer: Self-pay | Admitting: Internal Medicine

## 2022-12-23 ENCOUNTER — Ambulatory Visit: Payer: Medicare PPO | Admitting: Urology

## 2022-12-23 ENCOUNTER — Other Ambulatory Visit: Payer: Self-pay | Admitting: Cardiovascular Disease

## 2022-12-23 VITALS — BP 166/82 | HR 60 | Ht 71.0 in | Wt 175.0 lb

## 2022-12-23 DIAGNOSIS — R339 Retention of urine, unspecified: Secondary | ICD-10-CM | POA: Diagnosis not present

## 2022-12-23 DIAGNOSIS — N401 Enlarged prostate with lower urinary tract symptoms: Secondary | ICD-10-CM | POA: Diagnosis not present

## 2022-12-23 LAB — BLADDER SCAN AMB NON-IMAGING: Scan Result: 325

## 2022-12-23 NOTE — Progress Notes (Signed)
Dustin Harrison,acting as a scribe for Dustin Altes, MD.,have documented all relevant documentation on the behalf of Dustin Altes, MD,as directed by  Dustin Altes, MD while in the presence of Dustin Altes, MD.  12/23/2022 3:27 PM   Dustin Harrison 17-Oct-1931 [id number]  Referring provider: Karie Schwalbe, MD [address] [place],  [place] [address]  Chief Complaint  Patient presents with   Benign Prostatic Hypertrophy    HPI: Dustin Harrison is a 89 y.o. male who presents for follow up visit.  Refer to my previous note of 11/11/2022. PVR at that visit was 238 mL. It appeared that he had severe obstructive voiding symptoms with nocturia being his most bothersome symptom. Given a trial of Gemtesa He saw no significant improvement with Gemtesa Voiding symptoms are stable   PMH: Past Medical History:  Diagnosis Date   Abnormal glucose    Arthritis    Baker's cyst of knee    left   BPH (benign prostatic hypertrophy)    Cough    because of "tight" esophagus   Diabetes mellitus    Glaucoma    HOH (hard of hearing)    Hypertension    Pheochromocytoma of right adrenal gland    Stroke [facility])    Ulcer    Wears hearing aid    bilateral    Surgical History: Past Surgical History:  Procedure Laterality Date   adrenaletomy     CARDIAC CATHETERIZATION     CATARACT EXTRACTION W/PHACO Left 10/08/2015   Procedure: CATARACT EXTRACTION PHACO AND INTRAOCULAR LENS PLACEMENT (IOC) left eye;  Surgeon: Lockie Mola, MD;  Location: [facility];  Service: Ophthalmology;  Laterality: Left;  MALYUGIN   HERNIA REPAIR     RIGHT/LEFT HEART CATH AND CORONARY ANGIOGRAPHY Left 11/28/2019   Procedure: RIGHT/LEFT HEART CATH AND CORONARY ANGIOGRAPHY;  Surgeon: Yvonne Kendall, MD;  Location: [facility] INVAS[facility] CV [facility];  Service: Cardiovascular;  Laterality: Left;   SQUAMOUS CELL CARCINOMA EXCISION  03-2006   left ear   TEE WITHOUT CARDIOVERSION N/A 10/12/2019    Procedure: TRANSESOPHAGEAL ECHOCARDIOGRAM (TEE);  Surgeon: Antonieta Iba, MD;  Location: [facility] OR[facility];  Service: Cardiovascular;  Laterality: N/A;   TONSILLECTOMY     XI ROBOTIC LAPAROSCOPIC ASSISTED APPENDECTOMY N/A 10/11/2019   Procedure: XI ROBOTIC ASSISTED LAPAROSCOPIC INSERTION GASTROSTOMY TUBE;  Surgeon: Leafy Ro, MD;  Location: [facility] OR[facility];  Service: General;  Laterality: N/A;    Home Medications:  Allergies as of 12/23/2022   No Known Allergies      Medication List        Accurate as of December 23, 2022  3:27 PM. If you have any questions, ask your nurse or doctor.          amLODipine 5 MG tablet Commonly known as: NORVASC TAKE ONE TABLET BY MOUTH TWICE DAILY   atorvastatin 20 MG tablet Commonly known as: LIPITOR TAKE ONE TABLET BY MOUTH EVERY DAY   benzonatate 200 MG capsule Commonly known as: TESSALON Take 1 capsule (200 mg total) by mouth 3 (three) times daily as needed for cough.   dorzolamide-timolol 2-0.5 % ophthalmic solution Commonly known as: COSOPT 1 drop 2 (two) times daily.   ezetimibe 10 MG tablet Commonly known as: ZETIA TAKE ONE TABLET BY MOUTH EVERY DAY   finasteride 1 MG tablet Commonly known as: PROPECIA Take 1 tablet (1 mg total) by mouth daily.   Lancets Misc 1 each by Does not apply route  daily at 8 pm.   latanoprost 0.005 % ophthalmic solution Commonly known as: XALATAN Place 1 drop into both eyes at bedtime.   losartan 100 MG tablet Commonly known as: COZAAR TAKE ONE TABLET BY MOUTH EVERY DAY   metoprolol succinate 25 MG 24 hr tablet Commonly known as: TOPROL-XL TAKE 1 TABLET BY MOUTH DAILY   pantoprazole 20 MG tablet Commonly known as: PROTONIX TAKE 1 TABLET BY MOUTH DAILY   polyethylene glycol 17 g packet Commonly known as: MIRALAX / GLYCOLAX Take 17 g by mouth daily.   tadalafil 5 MG tablet Commonly known as: CIALIS Take 1 tablet (5 mg total) by mouth daily.   tamsulosin 0.4 MG Caps capsule Commonly known as:  FLOMAX TAKE 2 CAPSULES EVERY DAY   torsemide 20 MG tablet Commonly known as: DEMADEX Take 1 tablet (20 mg total) by mouth as directed. Take 1 tablet on Monday Wednesday Friday with extra 1 tablet for swelling.        Family History: Family History  Problem Relation Age of Onset   Alcohol abuse Mother    Coronary artery disease Mother    Brain cancer Father        tumor   Diabetes Brother     Social History:  reports that he has never smoked. He has never been exposed to tobacco smoke. He has never used smokeless tobacco. He reports that he does not currently use alcohol after a past usage of about 1.0 standard drink of alcohol per week. He reports that he does not use drugs.   Physical Exam: BP (!) 166/82   Pulse 60   Ht 5\' 11"  (1.803 m)   Wt 175 lb (79.4 kg)   BMI 24.41 kg/m   Constitutional:  Alert and oriented, No acute distress. HEENT: [place] AT, moist mucus membranes.  Trachea midline, no masses. Cardiovascular: No clubbing, cyanosis, or edema. Respiratory: Normal respiratory effort, no increased work of breathing. GI: Abdomen is soft, nontender, nondistended, no abdominal masses Skin: No rashes, bruises or suspicious lesions. Neurologic: Grossly intact, no focal deficits, moving all 4 extremities. Psychiatric: Normal mood and affect.   Assessment & Plan:    1. BPH with severe LUTS PVR today 325 mL We discussed surgical options. However, his most bothersome symptoms is nocturia, and we discussed that surgical procedures may not improve his nocturia due to multiple nocturia etiologies. He states he has been reading about minimally invasive options, however, did not remember the exact procedure he was reading about. We discussed that we perform UroLift here, and he was provided literature. If interested in pursuing, he will call back and we will schedule a cystoscopy Discussed that [facility] in [place] is performing Rezum aquablation Based on MRI 2023,  prostate volume was 45 cc   [facility] [address] [place], [place] [address] [phone]

## 2022-12-26 ENCOUNTER — Encounter: Payer: Self-pay | Admitting: Urology

## 2022-12-28 ENCOUNTER — Telehealth: Payer: Self-pay

## 2022-12-28 NOTE — Telephone Encounter (Signed)
Pharmacy Patient Advocate Encounter  Received notification from [facility] that Prior Authorization for Tadalafil 5mg  tablet has been APPROVED from 06/07/22 to 06/07/23.Marland Kitchen  PA #/Case ID/Reference #: [id number]  Approval letter attached to charts

## 2023-01-05 ENCOUNTER — Encounter: Payer: Medicare PPO | Admitting: Internal Medicine

## 2023-01-10 ENCOUNTER — Ambulatory Visit: Payer: Medicare PPO | Admitting: Urology

## 2023-01-10 ENCOUNTER — Encounter: Payer: Self-pay | Admitting: Internal Medicine

## 2023-01-10 ENCOUNTER — Other Ambulatory Visit: Payer: Self-pay

## 2023-01-10 ENCOUNTER — Encounter: Payer: Self-pay | Admitting: Urology

## 2023-01-10 ENCOUNTER — Ambulatory Visit (INDEPENDENT_AMBULATORY_CARE_PROVIDER_SITE_OTHER): Payer: Medicare PPO | Admitting: Internal Medicine

## 2023-01-10 VITALS — BP 136/80 | HR 55 | Temp 97.7°F | Ht 71.0 in | Wt 178.0 lb

## 2023-01-10 VITALS — BP 166/82 | HR 60

## 2023-01-10 DIAGNOSIS — N401 Enlarged prostate with lower urinary tract symptoms: Secondary | ICD-10-CM

## 2023-01-10 DIAGNOSIS — N400 Enlarged prostate without lower urinary tract symptoms: Secondary | ICD-10-CM | POA: Diagnosis not present

## 2023-01-10 DIAGNOSIS — R339 Retention of urine, unspecified: Secondary | ICD-10-CM

## 2023-01-10 DIAGNOSIS — R351 Nocturia: Secondary | ICD-10-CM

## 2023-01-10 DIAGNOSIS — N1832 Chronic kidney disease, stage 3b: Secondary | ICD-10-CM

## 2023-01-10 DIAGNOSIS — Z0000 Encounter for general adult medical examination without abnormal findings: Secondary | ICD-10-CM

## 2023-01-10 DIAGNOSIS — E1121 Type 2 diabetes mellitus with diabetic nephropathy: Secondary | ICD-10-CM | POA: Diagnosis not present

## 2023-01-10 DIAGNOSIS — I5020 Unspecified systolic (congestive) heart failure: Secondary | ICD-10-CM | POA: Diagnosis not present

## 2023-01-10 DIAGNOSIS — R300 Dysuria: Secondary | ICD-10-CM | POA: Diagnosis not present

## 2023-01-10 DIAGNOSIS — N138 Other obstructive and reflux uropathy: Secondary | ICD-10-CM | POA: Diagnosis not present

## 2023-01-10 LAB — CBC
HCT: 43.9 % (ref 39.0–52.0)
Hemoglobin: 14.3 g/dL (ref 13.0–17.0)
MCHC: 32.6 g/dL (ref 30.0–36.0)
MCV: 96.5 fl (ref 78.0–100.0)
Platelets: 279 10*3/uL (ref 150.0–400.0)
RBC: 4.55 Mil/uL (ref 4.22–5.81)
RDW: 13.7 % (ref 11.5–15.5)
WBC: 9 10*3/uL (ref 4.0–10.5)

## 2023-01-10 LAB — RENAL FUNCTION PANEL
Albumin: 4.8 g/dL (ref 3.5–5.2)
BUN: 39 mg/dL — ABNORMAL HIGH (ref 6–23)
CO2: 29 mEq/L (ref 19–32)
Calcium: 10.6 mg/dL — ABNORMAL HIGH (ref 8.4–10.5)
Chloride: 100 mEq/L (ref 96–112)
Creatinine, Ser: 1.51 mg/dL — ABNORMAL HIGH (ref 0.40–1.50)
GFR: 40.84 mL/min — ABNORMAL LOW (ref >60.00)
Glucose, Bld: 147 mg/dL — ABNORMAL HIGH (ref 70–99)
Phosphorus: 5.4 mg/dL — ABNORMAL HIGH (ref 2.3–4.6)
Potassium: 5.4 mEq/L — ABNORMAL HIGH (ref 3.5–5.1)
Sodium: 140 mEq/L (ref 135–145)

## 2023-01-10 LAB — URINALYSIS, COMPLETE
Bilirubin, UA: NEGATIVE
Ketones, UA: NEGATIVE
Leukocytes,UA: NEGATIVE
Nitrite, UA: NEGATIVE
Protein,UA: NEGATIVE
RBC, UA: NEGATIVE
Specific Gravity, UA: 1.01 (ref 1.005–1.030)
Urobilinogen, Ur: 0.2 mg/dL (ref 0.2–1.0)
pH, UA: 6 (ref 5.0–7.5)

## 2023-01-10 LAB — MICROSCOPIC EXAMINATION: Bacteria, UA: NONE SEEN

## 2023-01-10 LAB — LIPID PANEL
Cholesterol: 183 mg/dL (ref 0–200)
HDL: 108.1 mg/dL (ref >39.00)
LDL Cholesterol: 62 mg/dL (ref 0–99)
NonHDL: 74.43
Total CHOL/HDL Ratio: 2
Triglycerides: 62 mg/dL (ref 0.0–149.0)
VLDL: 12.4 mg/dL (ref 0.0–40.0)

## 2023-01-10 LAB — HEMOGLOBIN A1C: Hgb A1c MFr Bld: 6.6 % — ABNORMAL HIGH (ref 4.6–6.5)

## 2023-01-10 LAB — HM DIABETES FOOT EXAM

## 2023-01-10 NOTE — Assessment & Plan Note (Signed)
Sugars higher off metformin Back on jardiance 10mg  Consider restarting low dose metformin if over 9%

## 2023-01-10 NOTE — Progress Notes (Signed)
Subjective:    Patient ID: Dustin Harrison, male    DOB: [birth year], 89 y.o.   MRN: [id number]  HPI Here for Medicare wellness visit and follow up of chronic health conditions--with daughter Reviewed advanced directives Reviewed other doctors---Dr Stoioff--urology, Ms Vanlooy--audiology, Dr Gollan--cardiology, Dr Josie Saunders, Dr Zada Girt, Dr Myrtie Cruise Multiple ER visits this year---but no hospitalizations or surgery Vision is okay Hearing is still poor--cochlear implant on left, aide on right Occ glass of wine--or gin and tonic No tobacco Tries to walk at times--short distances (with cane) No falls No depression or anhedonia Daughter does shopping and sets up meds. Housekeeper twice a month. He and wife do other house chores Mild memory issues  Recent GFR 55  Just had cystoscopy today Planning urolift soon for incontinence/nocturia  Sugars running some high Had stopped metformin and jardiance due to hypoglycemia Now running high again Back on jardiance No foot numbness, tingling or burning  No chest pain or SOB No syncope. Gets up slow to avoid postural dizziness No palpitations No edema--with torsemide 3 days per week  Current Outpatient Medications on File Prior to Visit  Medication Sig Dispense Refill   amLODipine (NORVASC) 5 MG tablet TAKE ONE TABLET BY MOUTH TWICE DAILY 180 tablet 1   atorvastatin (LIPITOR) 20 MG tablet TAKE ONE TABLET BY MOUTH EVERY DAY 90 tablet 3   benzonatate (TESSALON) 200 MG capsule Take 1 capsule (200 mg total) by mouth 3 (three) times daily as needed for cough. 60 capsule 0   dorzolamide-timolol (COSOPT) 22.3-6.8 MG/ML ophthalmic solution 1 drop 2 (two) times daily.     empagliflozin (JARDIANCE) 10 MG TABS tablet Take 10 mg by mouth daily.     ezetimibe (ZETIA) 10 MG tablet TAKE ONE TABLET BY MOUTH EVERY DAY 90 tablet 3   finasteride (PROPECIA) 1 MG tablet TAKE 1 TABLET BY MOUTH DAILY 90 tablet 3   Lancets MISC 1 each by  Does not apply route daily at 8 pm. 100 each 3   latanoprost (XALATAN) 0.005 % ophthalmic solution Place 1 drop into both eyes at bedtime.     losartan (COZAAR) 100 MG tablet TAKE ONE TABLET BY MOUTH EVERY DAY 90 tablet 3   metoprolol succinate (TOPROL-XL) 25 MG 24 hr tablet TAKE 1 TABLET BY MOUTH DAILY 30 tablet 5   pantoprazole (PROTONIX) 20 MG tablet TAKE 1 TABLET BY MOUTH DAILY 90 tablet 3   polyethylene glycol (MIRALAX / GLYCOLAX) 17 g packet Take 17 g by mouth daily.     tadalafil (CIALIS) 5 MG tablet Take 1 tablet (5 mg total) by mouth daily. 30 tablet 11   tamsulosin (FLOMAX) 0.4 MG CAPS capsule TAKE 2 CAPSULES EVERY DAY 180 capsule 3   torsemide (DEMADEX) 20 MG tablet Take 1 tablet (20 mg total) by mouth as directed. Take 1 tablet on Monday Wednesday Friday with extra 1 tablet for swelling. 1 tablet 0   No current facility-administered medications on file prior to visit.    No Known Allergies  Past Medical History:  Diagnosis Date   Abnormal glucose    Arthritis    Baker's cyst of knee    left   BPH (benign prostatic hypertrophy)    Cough    because of "tight" esophagus   Diabetes mellitus    Glaucoma    HOH (hard of hearing)    Hypertension    Pheochromocytoma of right adrenal gland    Stroke [facility])    Ulcer    Wears hearing aid  bilateral    Past Surgical History:  Procedure Laterality Date   adrenaletomy     CARDIAC CATHETERIZATION     CATARACT EXTRACTION W/PHACO Left 10/08/2015   Procedure: CATARACT EXTRACTION PHACO AND INTRAOCULAR LENS PLACEMENT (IOC) left eye;  Surgeon: Lockie Mola, MD;  Location: [facility];  Service: Ophthalmology;  Laterality: Left;  MALYUGIN   HERNIA REPAIR     RIGHT/LEFT HEART CATH AND CORONARY ANGIOGRAPHY Left 11/28/2019   Procedure: RIGHT/LEFT HEART CATH AND CORONARY ANGIOGRAPHY;  Surgeon: Yvonne Kendall, MD;  Location: [facility] INV[facility] CV LAB;  Service: Cardiovascular;  Laterality: Left;   SQUAMOUS CELL CARCINOMA  EXCISION  03-2006   left ear   TEE WITHOUT CARDIOVERSION N/A 10/12/2019   Procedure: TRANSESOPHAGEAL ECHOCARDIOGRAM (TEE);  Surgeon: Antonieta Iba, MD;  Location: [facility] ORS;  Service: Cardiovascular;  Laterality: N/A;   TONSILLECTOMY     XI ROBOTIC LAPAROSCOPIC ASSISTED APPENDECTOMY N/A 10/11/2019   Procedure: XI ROBOTIC ASSISTED LAPAROSCOPIC INSERTION GASTROSTOMY TUBE;  Surgeon: Leafy Ro, MD;  Location: [facility] ORS;  Service: General;  Laterality: N/A;    Family History  Problem Relation Age of Onset   Alcohol abuse Mother    Coronary artery disease Mother    Brain cancer Father        tumor   Diabetes Brother     Social History   Socioeconomic History   Marital status: Married    Spouse name: Not on file   Number of children: Not on file   Years of education: Not on file   Highest education level: Doctorate  Occupational History   Occupation: chaplain  Tobacco Use   Smoking status: Never    Passive exposure: Never   Smokeless tobacco: Never  Vaping Use   Vaping status: Never Used  Substance and Sexual Activity   Alcohol use: Not Currently    Alcohol/week: 1.0 standard drink of alcohol    Types: 1 Glasses of wine per week    Comment: 1 glass per week   Drug use: No   Sexual activity: Not Currently  Other Topics Concern   Not on file  Social History Narrative   Has living will,   HCPOA (wife)--daughter is back pain   Would accept resuscitation   Would consider feeding tube--but no if cognitively unaware   Social Determinants of Health   Financial Resource Strain: Low Risk  (01/08/2023)   Overall Financial Resource Strain (CARDIA)    Difficulty of Paying Living Expenses: Not hard at all  Food Insecurity: No Food Insecurity (01/08/2023)   Hunger Vital Sign    Worried About Running Out of Food in the Last Year: Never true    Ran Out of Food in the Last Year: Never true  Transportation Needs: No Transportation Needs (01/08/2023)   PRAPARE - Doctor, general practice (Medical): No    Lack of Transportation (Non-Medical): No  Physical Activity: Unknown (01/08/2023)   Exercise Vital Sign    Days of Exercise per Week: 0 days    Minutes of Exercise per Session: Not on file  Stress: No Stress Concern Present (01/08/2023)   [employer] of Occupational Health - Occupational Stress Questionnaire    Feeling of Stress : Not at all  Social Connections: Socially Integrated (01/08/2023)   Social Connection and Isolation Panel [NHANES]    Frequency of Communication with Friends and Family: More than three times a week    Frequency of Social Gatherings with Friends and Family: Once a week  Attends Religious Services: More than 4 times per year    Active Member of Clubs or Organizations: Yes    Attends Banker Meetings: More than 4 times per year    Marital Status: Married  Catering manager Violence: Not At Risk (06/09/2021)   Humiliation, Afraid, Rape, and Kick questionnaire    Fear of Current or Ex-Partner: No    Emotionally Abused: No    Physically Abused: No    Sexually Abused: No   Review of Systems Appetite is fine Weight is stable Sleeps well--other than the nocturia Wears seat belt Teeth are fine--keeps up with dentist Several skin cancers removed Bowels move okay with miralax    Objective:   Physical Exam Constitutional:      Appearance: Normal appearance.  HENT:     Mouth/Throat:     Pharynx: No oropharyngeal exudate or posterior oropharyngeal erythema.  Eyes:     Conjunctiva/sclera: Conjunctivae normal.     Pupils: Pupils are equal, round, and reactive to light.  Cardiovascular:     Rate and Rhythm: Normal rate and regular rhythm.     Heart sounds:     No gallop.     Comments: Loud systolic murmur Faint pedal pulses Pulmonary:     Effort: Pulmonary effort is normal.     Breath sounds: Normal breath sounds. No wheezing or rales.  Abdominal:     Palpations: Abdomen is soft.     Tenderness: There is no  abdominal tenderness.  Musculoskeletal:     Cervical back: Neck supple.     Comments: Trace foot/ankle edema  Lymphadenopathy:     Cervical: No cervical adenopathy.  Skin:    Findings: No lesion or rash.     Comments: Callous present--but no foot ulcers or lesions  Neurological:     General: No focal deficit present.     Mental Status: He is alert and oriented to person, place, and time.     Comments: Word naming-- 5/1 minute Recall 1/3 Normal sensation in feet  Psychiatric:        Mood and Affect: Mood normal.        Behavior: Behavior normal.            Assessment & Plan:

## 2023-01-10 NOTE — Progress Notes (Signed)
   01/10/23  CC:  Chief Complaint  Patient presents with   Cysto    HPI: Refer to my previous note 12/23/2022.  Dustin Harrison desires to schedule UroLift and has not had cystoscopy  NED. A&Ox3.   No respiratory distress   Abd soft, NT, ND Normal phallus with bilateral descended testicles  Cystoscopy Procedure Note  Patient identification was confirmed, informed consent was obtained, and patient was prepped using Betadine solution.  Lidocaine jelly was administered per urethral meatus.     Pre-Procedure: - Inspection reveals a normal caliber urethral meatus.  Procedure: The flexible cystoscope was introduced without difficulty - No urethral strictures/lesions are present. -  Moderate lateral lobe enlargement  prostate  - Normal bladder neck; no median lobe - Bilateral ureteral orifices identified - Bladder mucosa  reveals no ulcers, tumors, or lesions - No bladder stones - Mild-moderate trabeculation  Retroflexion shows no intravesical median lobe or lesion   Post-Procedure: - Patient tolerated the procedure well  Assessment/ Plan: No findings on cystoscopy which would contraindicate UroLift The procedure was discussed including the most common side effects of storage related voiding symptoms, dysuria We discussed due to bladder hypotonicity his incomplete emptying may not resolve We also discussed the possibility of persistent symptoms after UroLift however the procedure does not preclude additional BPH treatments Other potential risk were discussed including bleeding and infection.  All questions were answered and he desires to schedule   Riki Altes, MD

## 2023-01-10 NOTE — Assessment & Plan Note (Addendum)
Ongoing symptoms despite tamsulosin 0.8, tadalafil 5 and finasteride 1mg  daily Planning urolift procedure

## 2023-01-10 NOTE — Progress Notes (Unsigned)
Surgical Physician Order Form [facility]  Dr. Irineo Axon, MD  * Scheduling expectation : Patient preference  *Length of Case: Minutes  *Clearance needed: Per Judie Grieve  *Anticoagulation Instructions: N/A  *Aspirin Instructions: N/A  *Post-op visit Date/Instructions:  1 month follow up with IPSS/PVR  *Diagnosis: BPH w/BOO  *Procedure: UroLift   Additional orders: N/A  -Admit type: OUTpatient  -Anesthesia: MAC  -VTE Prophylaxis Standing Order SCD's       Other:   -Standing Lab Orders Per Anesthesia    Lab other: UA&Urine Culture  -Standing Test orders EKG/Chest x-ray per Anesthesia       Test other:   - Medications:  Ancef 2gm IV  -Other orders: Please let Trinna Post or Alycia Rossetti know about case

## 2023-01-10 NOTE — Assessment & Plan Note (Addendum)
Last GFR was better Is on jardiance and losartan 100

## 2023-01-10 NOTE — Assessment & Plan Note (Signed)
I have personally reviewed the Medicare Annual Wellness questionnaire and have noted 1. The patient's medical and social history 2. Their use of alcohol, tobacco or illicit drugs 3. Their current medications and supplements 4. The patient's functional ability including ADL's, fall risks, home safety risks and hearing or visual             impairment. 5. Diet and physical activities 6. Evidence for depression or mood disorders  The patients weight, height, BMI and visual acuity have been recorded in the chart I have made referrals, counseling and provided education to the patient based review of the above and I have provided the pt with a written personalized care plan for preventive services.  I have provided you with a copy of your personalized plan for preventive services. Please take the time to review along with your updated medication list.  Done with cancer screening due to age Update COVID/flu vaccines in the fall Had RSV last year Trying to walk

## 2023-01-10 NOTE — Progress Notes (Signed)
Hearing Screening - Comments:: Has hearing aids. Wearing them today. Vision Screening - Comments:: Late August 2023

## 2023-01-10 NOTE — Assessment & Plan Note (Addendum)
Compensated with torsemide 20 three days per week---and metoprolol 25, amlodipine 5 daily, losartan 100

## 2023-01-13 ENCOUNTER — Telehealth: Payer: Self-pay

## 2023-01-13 NOTE — Progress Notes (Addendum)
    Urology-Progress Surgical Posting Form  Surgery Date: 02/15/2023  Surgeon: Dr. Irineo Axon, MD  Inpt ( No  )   Outpt (Yes)   Obs ( No  )   Diagnosis: N40.1, N13.8 Benign Prostatic Hyperplasia with Urinary Obstruction  -CPT: 52441, [phone]  Surgery: Cystoscopy with Insertion of Urolift  Stop Anticoagulations: N/A  Cardiac/Medical/Pulmonary Clearance needed: no  *Orders entered into EPIC  Date: 01/13/23   *Case booked in EPIC  Date: 01/13/23  *Notified pt of Surgery: Date: 01/13/23  PRE-OP UA & CX: yes, obtained in clinic on 01/10/2023  *Placed into Prior Authorization Work Angela Nevin Date: 01/13/23  Assistant/laser/rep:No

## 2023-01-13 NOTE — Telephone Encounter (Signed)
I spoke with Dustin Harrison. We have discussed possible surgery dates and Tuesday August 27th, 2024 was agreed upon by all parties. Patient given information about surgery date, what to expect pre-operatively and post operatively.  We discussed that a Pre-Admission Testing office will be calling to set up the pre-op visit that will take place prior to surgery, and that these appointments are typically done over the phone with a Pre-Admissions RN. Informed patient that our office will communicate any additional care to be provided after surgery. Patients questions or concerns were discussed during our call. Advised to call our office should there be any additional information, questions or concerns that arise. Patient verbalized understanding.

## 2023-01-21 ENCOUNTER — Other Ambulatory Visit: Payer: Self-pay | Admitting: Cardiology

## 2023-01-21 DIAGNOSIS — I5020 Unspecified systolic (congestive) heart failure: Secondary | ICD-10-CM

## 2023-01-24 ENCOUNTER — Telehealth: Payer: Self-pay | Admitting: *Deleted

## 2023-01-24 ENCOUNTER — Encounter
Admission: RE | Admit: 2023-01-24 | Discharge: 2023-01-24 | Disposition: A | Discharge status: Home / Self Care | Payer: Medicare PPO | Source: Ambulatory Visit | Attending: Urology | Admitting: Urology

## 2023-01-24 ENCOUNTER — Other Ambulatory Visit: Payer: Self-pay

## 2023-01-24 VITALS — Ht 71.0 in | Wt 175.0 lb

## 2023-01-24 DIAGNOSIS — E1121 Type 2 diabetes mellitus with diabetic nephropathy: Secondary | ICD-10-CM

## 2023-01-24 DIAGNOSIS — Z01812 Encounter for preprocedural laboratory examination: Secondary | ICD-10-CM

## 2023-01-24 HISTORY — DX: Chronic kidney disease, stage 3b: N18.32

## 2023-01-24 HISTORY — DX: Type 2 diabetes mellitus with other specified complication: E11.69

## 2023-01-24 HISTORY — DX: Unspecified systolic (congestive) heart failure: I50.20

## 2023-01-24 HISTORY — DX: Other chronic pain: G89.29

## 2023-01-24 HISTORY — DX: Type 2 diabetes mellitus with hypoglycemia without coma: E11.649

## 2023-01-24 HISTORY — DX: Type 2 diabetes mellitus with diabetic nephropathy: E11.21

## 2023-01-24 HISTORY — DX: Unspecified osteoarthritis, unspecified site: M19.90

## 2023-01-24 NOTE — Pre-Procedure Instructions (Signed)
A copy of the patient instructions have been mailed to the patient due to not having MyChart and being unable to drive. A copy of the Patient instructions have also been mailed to [place] their daughter in [place]. [name] is aware the instruction will be mailed to her.

## 2023-01-24 NOTE — Patient Instructions (Addendum)
Your procedure is scheduled on: Tuesday August 27  Report to the Registration Desk on the 1st floor of the [facility] To find out your arrival time, please call [phone] between 1PM - 3PM on:  Monday August 26  If your arrival time is 6:00 am, do not arrive before that time as the Medical Mall entrance doors do not open until 6:00 am.  REMEMBER: Instructions that are not followed completely may result in serious medical risk, up to and including death; or upon the discretion of your surgeon and anesthesiologist your surgery may need to be rescheduled.  Do not eat food after midnight the night before surgery.  No gum chewing or hard candies.  One week prior to surgery: Stop Anti-inflammatories (NSAIDS) such as Advil, Aleve, Ibuprofen, Motrin, Naproxen, Naprosyn and Aspirin based products such as Excedrin, Goody's Powder, BC Powder.  Stop ANY OVER THE COUNTER supplements until after surgery. You may however, continue to take Tylenol if needed for pain up until the day of surgery.  Continue taking all prescribed medications with the exception of the following: empagliflozin (JARDIANCE) hold 3 days prior to surgery, last dose  Saturday August 23  finasteride (PROPECIA) hold 3 days prior to surgery, last dose  Saturday August 23 losartan (COZAAR) hold 3 days prior to surgery, last dose Saturday  August 23 tadalafil (CIALIS)  hold 2 days prior to surgery , last dose Sunday August 25     TAKE ONLY THESE MEDICATIONS THE MORNING OF SURGERY WITH A SIP OF WATER:  amLODipine (NORVASC)  atorvastatin (LIPITOR)  benzonatate (TESSALON) if needed for cough dorzolamide-timolol (COSOPT)  ezetimibe (ZETIA)  metoprolol succinate (TOPROL-XL)  pantoprazole (PROTONIX) (take one the night before and one on the morning of surgery - helps to prevent nausea after surgery.) tamsulosin (FLOMAX)   No Alcohol for 24 hours before or after surgery.  No Smoking including e-cigarettes for 24 hours before  surgery.  No chewable tobacco products for at least 6 hours before surgery.  No nicotine patches on the day of surgery.  Do not use any "recreational" drugs for at least a week (preferably 2 weeks) before your surgery.  Please be advised that the combination of cocaine and anesthesia may have negative outcomes, up to and including death. If you test positive for cocaine, your surgery will be cancelled.  On the morning of surgery brush your teeth with toothpaste and water, you may rinse your mouth with mouthwash if you wish. Do not swallow any toothpaste or mouthwash.  Do not wear jewelry, make-up, hairpins, clips or nail polish.  Do not wear lotions, powders, or perfumes.   Do not shave body hair from the neck down 48 hours before surgery.  Contact lenses, hearing aids and dentures may not be worn into surgery.  Do not bring valuables to the hospital. [facility] is not responsible for any missing/lost belongings or valuables.   Notify your doctor if there is any change in your medical condition (cold, fever, infection).  Wear comfortable clothing (specific to your surgery type) to the hospital.  After surgery, you can help prevent lung complications by doing breathing exercises.   If you are being discharged the day of surgery, you will not be allowed to drive home. You will need a responsible individual to drive you home and stay with you for 24 hours after surgery.   If you are taking public transportation, you will need to have a responsible individual with you.  Please call the Pre-admissions Testing  Dept. at [phone] if you have any questions about these instructions.  Surgery Visitation Policy:  Patients having surgery or a procedure may have two visitors.  Children under the age of 11 must have an adult with them who is not the patient.

## 2023-01-24 NOTE — Telephone Encounter (Signed)
-----   Message from Verlee Monte sent at 01/24/2023  4:35 PM EDT ----- Regarding: Request for pre-operative cardiac clearance Request for pre-operative cardiac clearance:  1. What type of surgery is being performed?  CYSTOSCOPY WITH INSERTION OF UROLIFT  2. When is this surgery scheduled?  02/01/2023  3. Type of clearance being requested (medical, pharmacy, both)? MEDICAL   4. Are there any medications that need to be held prior to surgery? NONE  5. Practice name and name of physician performing surgery?  Performing surgeon: Dr. Irineo Axon, MD Requesting clearance: Quentin Mulling, FNP-C    6. Anesthesia type (none, local, MAC, general)? GENERAL  7. What is the office phone and fax number?   Phone: [phone] Fax: [phone]  ATTENTION: Unable to create telephone message as per your standard workflow. Directed by [facility] providers to send requests for cardiac clearance to this pool for appropriate distribution to provider covering pre-operative clearances.   Quentin Mulling, MSN, APRN, FNP-C, CEN [facility]  Peri-operative Services Nurse Practitioner Phone: [phone] 01/24/23 4:35 PM

## 2023-01-25 ENCOUNTER — Encounter: Payer: Self-pay | Admitting: Urgent Care

## 2023-01-25 NOTE — Telephone Encounter (Signed)
   Name: Dustin Harrison  DOB: Jun 21, 1931  MRN: [id number]  Primary Cardiologist: Julien Nordmann, MD   Preoperative team, please contact this patient and set up a phone call appointment for further preoperative risk assessment. Please obtain consent and complete medication review. Thank you for your help.  I confirm that guidance regarding antiplatelet and oral anticoagulation therapy has been completed and, if necessary, noted below.  None requested    Ronney Asters, NP 01/25/2023, 7:09 AM [facility]

## 2023-01-25 NOTE — Telephone Encounter (Signed)
I tried to call the pt to set up tele pre op appt for 01/28/23. Recording came on and states "this person has blocked your phone number".    I will update the requesting office. In hopes that they s/w the pt to please let him know he needs his cardiologist office [phone] and ask to s/w the pre op team to schedule a televisit for pre op clearance.

## 2023-01-26 ENCOUNTER — Encounter: Payer: Self-pay | Admitting: Urology

## 2023-01-26 ENCOUNTER — Telehealth: Payer: Self-pay

## 2023-01-26 ENCOUNTER — Telehealth: Payer: Self-pay | Admitting: *Deleted

## 2023-01-26 NOTE — Telephone Encounter (Signed)
Pt's daughter states the procedure has been moved to 02/15/23.          Tarri Fuller, CMA Certified Medical Assistant   Telephone Encounter Signed   Creation Time: 01/26/2023 10:50 AM   Signed     I s/w pt's daughter (DPR, PO[place]). Pt's daughter [name] has scheduled the pt for a tele preop appt 02/11/23. Meds will need to be reviewed during tele appt when pt's daughter is here to do the tele with the pt.    Consent has been given.    I will update all parties involved.              Patient Consent for Virtual Visit        Nahuel Ence has provided verbal consent on 01/26/2023 for a virtual visit (video or telephone).   CONSENT FOR VIRTUAL VISIT FOR:  Donnella Sham  By participating in this virtual visit I agree to the following:  I hereby voluntarily request, consent and authorize [facility] and its employed or contracted physicians, physician assistants, nurse practitioners or other licensed health care professionals (the Practitioner), to provide me with telemedicine health care services (the "Services") as deemed necessary by the treating Practitioner. I acknowledge and consent to receive the Services by the Practitioner via telemedicine. I understand that the telemedicine visit will involve communicating with the Practitioner through live audiovisual communication technology and the disclosure of certain medical information by electronic transmission. I acknowledge that I have been given the opportunity to request an in-person assessment or other available alternative prior to the telemedicine visit and am voluntarily participating in the telemedicine visit.  I understand that I have the right to withhold or withdraw my consent to the use of telemedicine in the course of my care at any time, without affecting my right to future care or treatment, and that the Practitioner or I may terminate the telemedicine visit at any time. I understand that I have the right  to inspect all information obtained and/or recorded in the course of the telemedicine visit and may receive copies of available information for a reasonable fee.  I understand that some of the potential risks of receiving the Services via telemedicine include:  Delay or interruption in medical evaluation due to technological equipment failure or disruption; Information transmitted may not be sufficient (e.g. poor resolution of images) to allow for appropriate medical decision making by the Practitioner; and/or  In rare instances, security protocols could fail, causing a breach of personal health information.  Furthermore, I acknowledge that it is my responsibility to provide information about my medical history, conditions and care that is complete and accurate to the best of my ability. I acknowledge that Practitioner's advice, recommendations, and/or decision may be based on factors not within their control, such as incomplete or inaccurate data provided by me or distortions of diagnostic images or specimens that may result from electronic transmissions. I understand that the practice of medicine is not an exact science and that Practitioner makes no warranties or guarantees regarding treatment outcomes. I acknowledge that a copy of this consent can be made available to me via my patient portal [facility] MyChart), or I can request a printed copy by calling the office of  [facility].    I understand that my insurance will be billed for this visit.   I have read or had this consent read to me. I understand the contents of this consent, which adequately explains the benefits and  risks of the Services being provided via telemedicine.  I have been provided ample opportunity to ask questions regarding this consent and the Services and have had my questions answered to my satisfaction. I give my informed consent for the services to be provided through the use of telemedicine in my medical  care

## 2023-01-26 NOTE — Telephone Encounter (Signed)
We will await the pt to call back because we have not been able to reach the pt. See notes.

## 2023-01-26 NOTE — Telephone Encounter (Signed)
Pt's daughter states the procedure has been moved to 02/15/23.

## 2023-01-26 NOTE — Telephone Encounter (Signed)
I s/w pt's daughter (DPR, [place]). Pt's daughter [name] has scheduled the pt for a tele preop appt 02/11/23. Meds will need to be reviewed during tele appt when pt's daughter is here to do the tele with the pt.   Consent has been given.   I will update all parties involved.

## 2023-02-01 HISTORY — DX: Other intervertebral disc degeneration, lumbar region: M51.36

## 2023-02-02 DIAGNOSIS — C44229 Squamous cell carcinoma of skin of left ear and external auricular canal: Secondary | ICD-10-CM | POA: Diagnosis not present

## 2023-02-02 DIAGNOSIS — D2322 Other benign neoplasm of skin of left ear and external auricular canal: Secondary | ICD-10-CM | POA: Diagnosis not present

## 2023-02-03 ENCOUNTER — Telehealth: Payer: Self-pay | Admitting: Internal Medicine

## 2023-02-03 NOTE — Telephone Encounter (Signed)
Prescription Request  02/03/2023  LOV: 01/10/2023  What is the name of the medication or equipment? tadalafil (CIALIS) 5 MG tablet   Have you contacted your pharmacy to request a refill? No   Which pharmacy would you like this sent to?  [facility] - [place], [place] - [address] [place] [address] Phone: [phone] Fax: [phone]    Patient notified that their request is being sent to the clinical staff for review and that they should receive a response within 2 business days.   Please advise at [facility] [phone]  Patient asked for a 90 day supply of 5mg  and asked if he is able to get a 30 day supply of 20mg 

## 2023-02-03 NOTE — Telephone Encounter (Signed)
I do not have 20 mg on his med list. There is not record of it.

## 2023-02-09 ENCOUNTER — Other Ambulatory Visit: Payer: Self-pay

## 2023-02-09 ENCOUNTER — Encounter
Admission: RE | Admit: 2023-02-09 | Discharge: 2023-02-09 | Disposition: A | Discharge status: Home / Self Care | Payer: Medicare PPO | Source: Ambulatory Visit | Attending: Urology | Admitting: Urology

## 2023-02-09 DIAGNOSIS — Z08 Encounter for follow-up examination after completed treatment for malignant neoplasm: Secondary | ICD-10-CM | POA: Diagnosis not present

## 2023-02-09 DIAGNOSIS — L821 Other seborrheic keratosis: Secondary | ICD-10-CM | POA: Diagnosis not present

## 2023-02-09 DIAGNOSIS — D225 Melanocytic nevi of trunk: Secondary | ICD-10-CM | POA: Diagnosis not present

## 2023-02-09 DIAGNOSIS — C44329 Squamous cell carcinoma of skin of other parts of face: Secondary | ICD-10-CM | POA: Diagnosis not present

## 2023-02-09 DIAGNOSIS — L570 Actinic keratosis: Secondary | ICD-10-CM | POA: Diagnosis not present

## 2023-02-09 DIAGNOSIS — D485 Neoplasm of uncertain behavior of skin: Secondary | ICD-10-CM | POA: Diagnosis not present

## 2023-02-09 DIAGNOSIS — Z85828 Personal history of other malignant neoplasm of skin: Secondary | ICD-10-CM | POA: Diagnosis not present

## 2023-02-09 DIAGNOSIS — D0439 Carcinoma in situ of skin of other parts of face: Secondary | ICD-10-CM | POA: Diagnosis not present

## 2023-02-09 NOTE — Patient Instructions (Addendum)
Your procedure is scheduled on: Tuesday September 10  Report to the Registration Desk on the 1st floor of the [facility] To find out your arrival time, please call [phone] between 1PM - 3PM on:  Monday September 9  If your arrival time is 6:00 am, do not arrive before that time as the Medical Mall entrance doors do not open until 6:00 am.  REMEMBER: Instructions that are not followed completely may result in serious medical risk, up to and including death; or upon the discretion of your surgeon and anesthesiologist your surgery may need to be rescheduled.  Do not eat food after midnight the night before surgery.  No gum chewing or hard candies.   One week prior to surgery: Stop Anti-inflammatories (NSAIDS) such as Advil, Aleve, Ibuprofen, Motrin, Naproxen, Naprosyn and Aspirin based products such as Excedrin, Goody's Powder, BC Powder.  Stop ANY OVER THE COUNTER supplements until after surgery.  You may however, continue to take Tylenol if needed for pain up until the day of surgery.  Continue taking all prescribed medications with the exception of the following: losartan (COZAAR) Don not take the day of surgery , last dose Monday September 9   Follow recommendations from Cardiologist or PCP regarding stopping blood thinners.  TAKE ONLY THESE MEDICATIONS THE MORNING OF SURGERY WITH A SIP OF WATER:  amLODipine (NORVASC)  atorvastatin (LIPITOR)  benzonatate (TESSALON) if needed ezetimibe (ZETIA ) finasteride (PROPECIA)  metoprolol succinate (TOPROL-XL)  pantoprazole (PROTONIX) (take one the night before and one on the morning of surgery - helps to prevent nausea after surgery.) tamsulosin (FLOMAX)   No Alcohol for 24 hours before or after surgery.  No Smoking including e-cigarettes for 24 hours before surgery.  No chewable tobacco products for at least 6 hours before surgery.  No nicotine patches on the day of surgery.  Do not use any "recreational" drugs for at least  a week (preferably 2 weeks) before your surgery.  Please be advised that the combination of cocaine and anesthesia may have negative outcomes, up to and including death. If you test positive for cocaine, your surgery will be cancelled.  On the morning of surgery brush your teeth with toothpaste and water, you may rinse your mouth with mouthwash if you wish. Do not swallow any toothpaste or mouthwash.  Do not wear jewelry, make-up, hairpins, clips or nail polish.  Do not wear lotions, powders, or perfumes.   Do not shave body hair from the neck down 48 hours before surgery.  Contact lenses, hearing aids and dentures may not be worn into surgery.  Do not bring valuables to the hospital. [facility] is not responsible for any missing/lost belongings or valuables.   Notify your doctor if there is any change in your medical condition (cold, fever, infection).  Wear comfortable clothing (specific to your surgery type) to the hospital.  After surgery, you can help prevent lung complications by doing breathing exercises.  Take deep breaths and cough every 1-2 hours.   If you are being discharged the day of surgery, you will not be allowed to drive home. You will need a responsible individual to drive you home and stay with you for 24 hours after surgery.   If you are taking public transportation, you will need to have a responsible individual with you.  Please call the Pre-admissions Testing Dept. at [phone] if you have any questions about these instructions.  Surgery Visitation Policy:  Patients having surgery or a procedure may have  two visitors.  Children under the age of 67 must have an adult with them who is not the patient.

## 2023-02-10 ENCOUNTER — Encounter: Payer: Self-pay | Admitting: Internal Medicine

## 2023-02-11 ENCOUNTER — Ambulatory Visit: Payer: Medicare PPO | Attending: Cardiology

## 2023-02-11 DIAGNOSIS — Z01818 Encounter for other preprocedural examination: Secondary | ICD-10-CM

## 2023-02-11 DIAGNOSIS — Z01810 Encounter for preprocedural cardiovascular examination: Secondary | ICD-10-CM | POA: Diagnosis not present

## 2023-02-11 NOTE — Progress Notes (Signed)
Perioperative / Anesthesia Services  Pre-Admission Testing Clinical Review / Pre-Operative Anesthesia Consult  Date: 02/11/23  Patient Demographics:  Name: Dustin Harrison DOB:   1932-05-25 MRN:   [id number]  Planned Surgical Procedure(s):    Case: [id number] Date/Time: 02/15/23 1444   Procedure: CYSTOSCOPY WITH INSERTION OF UROLIFT   Anesthesia type: Monitor Anesthesia Care   Pre-op diagnosis: Benign Prostatic Hyperplasia with Urinary Obstruction   Location: [facility] OR ROOM 10 / [facility]   Surgeons: Riki Altes, MD     NOTE: Available PAT nursing documentation and vital signs have been reviewed. Clinical nursing staff has updated patient's PMH/PSHx, current medication list, and drug allergies/intolerances to ensure comprehensive history available to assist in medical decision making as it pertains to the aforementioned surgical procedure and anticipated anesthetic course. Extensive review of available clinical information personally performed. Dustin Harrison PMH and PSHx updated with any diagnoses/procedures that  may have been inadvertently omitted during his intake with the pre-admission testing department's nursing staff.  Clinical Discussion:  Dustin Harrison is a 89 y.o. male who is submitted for pre-surgical anesthesia review and clearance prior to him undergoing the above procedure. Patient has never been a smoker. Pertinent PMH includes: CAD, stress-induced dilated cardiomyopathy, HFrEF, PSVT, aortic stenosis, cerebral microvascular disease, CVA, aortic atherosclerosis, ascending aorta dilatation, HTN, HLD, T2DM, CKD-III, RIGHT vocal cord paralysis, duodenal ulcer, RIGHT pheochromocytoma (s/p adrenalectomy), ED (on PDE5i), cervical/thoracic/lumbar DDD, chronic back pain, OA, BPH.  Patient is followed by cardiology Mariah Milling, MD). He was last seen in the cardiology clinic on 07/19/2022; notes reviewed. At the time of his clinic visit, patient doing well overall  from a cardiovascular perspective. Patient denied any chest pain, shortness of breath, PND, orthopnea, palpitations, significant peripheral edema, weakness, fatigue, vertiginous symptoms, or presyncope/syncope. Patient with a past medical history significant for cardiovascular diagnoses. Documented physical exam was grossly benign, providing no evidence of acute exacerbation and/or decompensation of the patient's known cardiovascular conditions.  Patient diagnosed with stress-induced dilated cardiomyopathy in 10/2019.  TTE on 10/08/2019 that revealed a severely reduced left ventricular systolic function with an EF of 25-30%.  There was moderate LVH.  Severe mid apical, anterolateral, anterior segment, and inferoseptal akinesis noted.  There was mild left atrial dilatation.  Mild to moderate aortic valve sclerosis noted.  RVSP = 40 mmHg.  All transvalvular gradients were noted to be normal providing no evidence suggestive of valvular stenosis.  Aorta normal in size with no evidence of aneurysmal dilatation.  TEE performed on 10/12/2019 revealed a similar severely reduced left ventricular systolic function with a noted EF of 25-30%. There was global hypokinesis.  Severe aortic valve sclerosis noted.  Visually, there was moderate aortic valve stenosis with a peak-peak gradient of about 20 mmHg.  Diagnostic RIGHT/LEFT heart catheterization was performed on 11/28/2019 revealing multivessel CAD; 90% ostial ramus intermedius (right-left collaterals), 50% ostial to proximal LAD, 60% D1, 30% D2, 20% proximal LCx, and 20% OM1.  Given collateral flow and the ramus, the decision was made to defer intervention opting for aggressive medical management.  Hemodynamics: mean RA = 9 mmHg, mean PA = 21 mmHg, mean PCWP = 15 mmHg, AO saturation = 97%, PA sat = 69%, CO = 5.8 L/min, and CI 2.9 L/min/m.  Most recent TTE was performed on 01/16/2020 revealing overall improved left ventricular systolic function with an EF of 60 to  65%.  There was mild LVH. Left ventricular diastolic Doppler parameters consistent with abnormal relaxation (G1DD). Mild to moderate  aortic valve sclerosis noted. All transvalvular gradients were noted to be normal providing no evidence suggestive of valvular stenosis. Aorta LV normal in size with no evidence of aneurysmal dilatation. Ascending aorta noted to be dilated at 39 mmHg.  Long-term cardiac event monitor study performed on 09/29/2020 revealing a predominant underlying sinus rhythm.  There were 16 episodes of PSVT noted with the fastest interval lasting 4 beats at a maximum rate of 174 bpm and the longest lasting 8 beats at an average rate of 93 bpm.  Blood pressure reasonably controlled at 142/72 mmHg on currently prescribed CCB (amlodipine), ARB (losartan), and beta-blocker (metoprolol succinate) therapies.  Patient is on atorvastatin + ezetimibe for his HLD diagnosis and ASCVD prevention. T2DM well controlled on currently prescribed regimen; last HgbA1c was 6.7% when checked on 07/07/2022.  Of note, since patient was last seen by his cardiologist, his hemoglobin A1c level has been rechecked with further improvement to 6.6% when checked on 01/10/2023.  In the setting of known cardiovascular diagnoses, it is important to note that patient is on a PDE5i medication (tadalafil) for both erectile dysfunction and BPH diagnoses.  He does not have an OSAH diagnosis. Functional capacity is somewhat limited by patient's age and multiple medical comorbidities.  With that said, patient is able to complete all of his ADL/IADLs without significant cardiovascular limitation.  Per the DASI, patient is able to achieve > 4 METS of physical activity without experiencing any significant degree of angina/anginal equivalent symptoms No changes were made to his medication regimen during his visit with cardiology.  Patient scheduled to follow-up with outpatient cardiology in 1 year or sooner if needed.  Dustin Harrison  is scheduled for CYSTOSCOPY WITH INSERTION OF UROLIFT on 02/15/2023 with Dr. Irineo Axon, MD.  Given patient's past medical history significant for cardiovascular diagnoses, presurgical cardiac clearance was sought by the PAT team. Per cardiology, "according to the Revised Cardiac Risk Index (RCRI), his Perioperative Risk of Major Cardiac Event is (%): 0.4. His Functional Capacity in METs is: 8.91 according to the [employer] Activity Status Index (DASI). Therefore, based on ACC/AHA guidelines, patient would be at ACCEPTABLE risk for the planned procedure without further cardiovascular testing"  In review of his medication reconciliation, the patient is not noted to be taking any type of anticoagulation or antiplatelet therapies that would need to be held during his perioperative course.  Patient denies previous perioperative complications with anesthesia in the past. In review of the available records, it is noted that patient underwent a general anesthetic course here at [facility] ([facility]) in 10/2019 without documented complications.      02/09/2023   12:49 PM 01/24/2023    2:16 PM 01/10/2023   10:45 AM  Vitals with BMI  Height 5\' 11"  5\' 11"    Weight 175 lbs 175 lbs   BMI 24.42 24.42   Systolic   166  Diastolic   82  Pulse   60    Providers/Specialists:   NOTE: Primary physician provider listed below. Patient may have been seen by APP or partner within same practice.   PROVIDER ROLE / SPECIALTY LAST Winfield Cunas, MD Urology (Surgeon) 11/11/2022  Karie Schwalbe, MD Primary Care Provider 01/10/2023  Julien Nordmann, MD Cardiology 07/19/2022; update call with preop APP on 02/11/2023   Allergies:  Patient has no known allergies.  Current Home Medications:   No current facility-administered medications for this encounter.    amLODipine (NORVASC) 5 MG tablet  atorvastatin (LIPITOR) 20 MG tablet   benzonatate (TESSALON) 200 MG capsule    dorzolamide-timolol (COSOPT) 22.3-6.8 MG/ML ophthalmic solution   ezetimibe (ZETIA) 10 MG tablet   finasteride (PROPECIA) 1 MG tablet   Lancets MISC   latanoprost (XALATAN) 0.005 % ophthalmic solution   losartan (COZAAR) 100 MG tablet   metoprolol succinate (TOPROL-XL) 25 MG 24 hr tablet   pantoprazole (PROTONIX) 20 MG tablet   polyethylene glycol (MIRALAX / GLYCOLAX) 17 g packet   tadalafil (CIALIS) 5 MG tablet   tamsulosin (FLOMAX) 0.4 MG CAPS capsule   History:   Past Medical History:  Diagnosis Date   Aortic atherosclerosis (HCC)    Aortic stenosis 10/12/2019   a.) TEE 10/12/2019: visually mod AS (peak-peak gradient ~ 20 mmHg); b.) TTE 01/16/2020: mild-mod sclerosis with no stenosis (MPG 6; VTI = 2.47)   Arthritis    Ascending aorta dilatation (HCC) 01/16/2020   a.) TTE 01/16/2020: asc Ao = 39 mm   Bacteremia due to group B Streptococcus 10/07/2019   Baker's cyst of knee, left    BPH (benign prostatic hypertrophy)    CAD (coronary artery disease) 11/28/2019   a.) R/LHC 11/28/2019: 90% oRI (R-L collaterals), 50% o-pLAD - med mgmt   Cerebral microvascular disease    Chronic back pain    Chronic cough    a.) secondary to "tight" esophagus   Cochlear implant in place (LEFT) 06/27/2018   DDD (degenerative disc disease), cervical    DDD (degenerative disc disease), lumbar    DDD (degenerative disc disease), thoracic    Duodenal ulcer    Erectile dysfunction    a.) on PDE5i (tadalafil)   Glaucoma    HFrEF (heart failure with reduced ejection fraction) (HCC)    a.) TTE 10/08/2019: EF 25-30%, mod LVH, sev mid-apical/antlat/apical/infsep AK, mild LA dil, mild-mod AoV scler, RVSP 40 ; b.) TEE 10/06/2019: EF 25-30% glob HK, sev AoV scler with visually mod AS (peak-peak grad ~20 mmHg); c.) R/LHC 11/28/2019: EF >65%. mRA 8, mPA 21, mPCWP 15, AO sat 97, PA sat 69, CO 5.8, CI 2.9; d.) TTE 01/16/2020: EF 60-65%, mild LVH, mild-mod AoV scler, asc Ao 39 mm, G1DD   History of CVA  (cerebrovascular accident) 10/2019   Hyperlipidemia associated with type 2 diabetes mellitus (HCC)    Hypertension    Hypoglycemia unawareness associated with type 2 diabetes mellitus (HCC)    Osteoarthritis    Paralysis of right vocal cord 10/2019   a.) unknown etiology; ENT consulted--> DDx included idiopathic cause, such as a viral neuropathy, or a subhemispheric stroke   Pheochromocytoma of right adrenal gland    a.) s/p RIGHT adrenalectomy   PSVT (paroxysmal supraventricular tachycardia) 09/29/2020   a.) Zio 09/29/2020: PSVT x 16 runs --> fastest lasting 4 beats (max rate 174 bpm) and longest lasting 8 beats (max rate 93 bpm)   Sensorineural hearing loss (SNHL), bilateral    a.) wears BILATERAL hearing aids; b.) s/p LEFT cochlear implant   Stage 3b chronic kidney disease (CKD) (HCC)    Stress-induced cardiomyopathy 10/08/2019   a.) TTE 10/08/2019: EF 25-30%; b.) TEE 10/06/2019: EF 25-30%; c.) R/LHC 11/28/2019: EF >65%; d.) TTE 01/16/2020: EF 60-65%   Temporomandibular joint osteoarthritis (LEFT)    Type II diabetes mellitus with nephropathy (HCC)    Wears hearing aid    bilateral   Past Surgical History:  Procedure Laterality Date   ADRENALECTOMY Right    CATARACT EXTRACTION W/PHACO Left 10/08/2015   Procedure: CATARACT EXTRACTION PHACO AND INTRAOCULAR  LENS PLACEMENT (IOC) left eye;  Surgeon: Lockie Mola, MD;  Location: [facility];  Service: Ophthalmology;  Laterality: Left;  MALYUGIN   COCHLEAR IMPLANT Left 06/27/2018   HERNIA REPAIR     RIGHT/LEFT HEART CATH AND CORONARY ANGIOGRAPHY Left 11/28/2019   Procedure: RIGHT/LEFT HEART CATH AND CORONARY ANGIOGRAPHY;  Surgeon: Yvonne Kendall, MD;  Location: [facility] INVASIVE CV LAB;  Service: Cardiovascular;  Laterality: Left;   SQUAMOUS CELL CARCINOMA EXCISION  03/2006   left ear   TEE WITHOUT CARDIOVERSION N/A 10/12/2019   Procedure: TRANSESOPHAGEAL ECHOCARDIOGRAM (TEE);  Surgeon: Antonieta Iba, MD;  Location: [facility]  ORS;  Service: Cardiovascular;  Laterality: N/A;   TONSILLECTOMY     XI ROBOTIC LAPAROSCOPIC ASSISTED APPENDECTOMY N/A 10/11/2019   Procedure: XI ROBOTIC ASSISTED LAPAROSCOPIC INSERTION GASTROSTOMY TUBE;  Surgeon: Leafy Ro, MD;  Location: [facility] ORS;  Service: General;  Laterality: N/A;   Family History  Problem Relation Age of Onset   Alcohol abuse Mother    Coronary artery disease Mother    Brain cancer Father        tumor   Diabetes Brother    Social History   Tobacco Use   Smoking status: Never    Passive exposure: Never   Smokeless tobacco: Never  Vaping Use   Vaping status: Never Used  Substance Use Topics   Alcohol use: Not Currently    Alcohol/week: 1.0 standard drink of alcohol    Types: 1 Glasses of wine per week    Comment: 1 glass per week   Drug use: No    Pertinent Clinical Results:  LABS:   Lab Results  Component Value Date   WBC 9.0 01/10/2023   HGB 14.3 01/10/2023   HCT 43.9 01/10/2023   MCV 96.5 01/10/2023   PLT 279.0 01/10/2023   Lab Results  Component Value Date   NA 140 01/10/2023   K 5.4 (H) 01/10/2023   CO2 29 01/10/2023   GLUCOSE 147 (H) 01/10/2023   BUN 39 (H) 01/10/2023   CREATININE 1.51 (H) 01/10/2023   CALCIUM 10.6 (H) 01/10/2023   GFR 40.84 (L) 01/10/2023   EGFR 38 (L) 09/15/2020   GFRNONAA 55 (L) 11/21/2022    ECG: Date: 11/21/2022 Time ECG obtained: 1425 PM Rate: 63 bpm Rhythm: normal sinus Axis (leads I and aVF): Normal Intervals: PR 199 ms. QRS 104 ms. QTc 461 ms. ST segment and T wave changes: No evidence of acute ST segment elevation or depression.   Comparison: Similar to previous tracing obtained on 04/26/2022  IMAGING / PROCEDURES: DIAGNOSTIC RADIOGRAPHS OF CHEST 2 VIEWS  performed on 11/21/2022 Stable cardiac and mediastinal contours on the lateral view, there are inferior focal consolidative opacities overlying the lower spine which may represent atelectasis or infection. No pleural effusion or  pneumothorax. Degenerative changes within the thoracic spine  LONG TERM CARDIAC EVENT MONITOR STUDY performed on  09/29/2020 Patch Wear Time:  7 days and 0 hours (2022-04-11T16:03:33-0400 to 2022-04-18T16:03:46-0400) Predominant underlying rhythm was Sinus Rhythm.  Patient had a min HR of 36 bpm, max HR of 174 bpm, and avg HR of 58 bpm. Junctional rhythm noted 16 Supraventricular Tachycardia runs occurred, the run with the fastest interval lasting 4 beats with a max rate of 174 bpm, the longest lasting 8 beats with an avg rate of 93 bpm. Ectopic Atrial Rhythm was present.  Isolated SVEs were occasional (1.3%, 7699), SVE Couplets were rare (<1.0%, 279), and SVE Triplets were rare (<1.0%, 33).  Isolated VEs were rare (<  1.0%), VE Couplets were rare (<1.0%), and no VE Triplets were present. Ventricular Bigeminy and Trigeminy were present.  TRANSTHORACIC ECHOCARDIOGRAM performed on 01/16/2020 Left ventricular ejection fraction, by estimation, is 60 to 65%. The left ventricle has normal function. The left ventricle has no regional wall motion abnormalities. There is mild left ventricular hypertrophy. Left ventricular diastolic parameters are consistent with age-related delayed relaxation (normal).  Right ventricular systolic function is normal. The right ventricular size is normal. There is normal pulmonary artery systolic pressure.  The mitral valve is normal in structure. Trivial mitral valve regurgitation.  The aortic valve is tricuspid. Aortic valve regurgitation is not visualized. Mild to moderate aortic valve sclerosis/calcification is present, without any evidence of aortic stenosis.  Aortic dilatation noted. There is mild dilatation of the ascending aorta measuring 39 mm.  The inferior vena cava is normal in size with greater than 50% respiratory variability, suggesting right atrial pressure of 3 mmHg.   RIGHT/LEFT HEART CATHETERIZATION AND CORONARY ANGIOGRAPHY performed on  11/28/2019 Multivessel CAD 90% ostial ramus intermedius with right to left collaterals 50% ostial to proximal LAD 60% D1 30% D2  20% proximal LCx 20% OM1 Upper normal to mildly elevated left and right heart filling pressures. Mean RA = 9 mmHg Mean PA = 21 mmHg Mean PCWP = 15 mmHg AO saturation = 97% PA saturation = 69% CO = 5.8 L/min CI = 2.9 L/min/m Hyperdynamic left ventricular contraction. Mild to moderate aortic stenosis. Recommendations: Continue medical therapy and secondary prevention of coronary artery disease. Consider repeat echo to confirm LVEF and aortic stenosis.   Impression and Plan:  Dustin Harrison has been referred for pre-anesthesia review and clearance prior to him undergoing the planned anesthetic and procedural courses. Available labs, pertinent testing, and imaging results were personally reviewed by me in preparation for upcoming operative/procedural course. [facility] medical record has been updated following extensive record review and patient interview with PAT staff.   This patient has been appropriately cleared by cardiology with an overall ACCEPTABLE risk of experiencing significant perioperative cardiovascular complications. Based on clinical review performed today (02/11/23), barring any significant acute changes in the patient's overall condition, it is anticipated that he will be able to proceed with the planned surgical intervention. Any acute changes in clinical condition may necessitate his procedure being postponed and/or cancelled. Patient will meet with anesthesia team (MD and/or CRNA) on the day of his procedure for preoperative evaluation/assessment. Questions regarding anesthetic course will be fielded at that time.   Pre-surgical instructions were reviewed with the patient during his PAT appointment, and questions were fielded to satisfaction by PAT clinical staff. He has been instructed on which medications that he will need to hold prior  to surgery, as well as the ones that have been deemed safe/appropriate to take on the day of his procedure. As part of the general education provided by PAT, patient made aware both verbally and in writing, that he would need to abstain from the use of any illegal substances during his perioperative course.  He was advised that failure to follow the provided instructions could necessitate case cancellation or result in serious perioperative complications up to and including death. Patient encouraged to contact PAT and/or his surgeon's office to discuss any questions or concerns that may arise prior to surgery; verbalized understanding.   Quentin Mulling, MSN, APRN, FNP-C, CEN [facility]  Peri-operative Services Nurse Practitioner Phone: [phone] Fax: [phone] 02/11/23 3:20 PM  NOTE: This note has been  prepared using Scientist, clinical (histocompatibility and immunogenetics). Despite my best ability to proofread, there is always the potential that unintentional transcriptional errors may still occur from this process.

## 2023-02-11 NOTE — Progress Notes (Signed)
Virtual Visit via Telephone Note   Because of Dustin Harrison's co-morbid illnesses, he is at least at moderate risk for complications without adequate follow up.  This format is felt to be most appropriate for this patient at this time.  The patient did not have access to video technology/had technical difficulties with video requiring transitioning to audio format only (telephone).  All issues noted in this document were discussed and addressed.  No physical exam could be performed with this format.  Please refer to the patient's chart for his consent to telehealth for [facility].  Evaluation Performed:  Preoperative cardiovascular risk assessment _____________   Date:  02/11/2023   Patient ID:  Dustin Harrison, DOB 1931/10/21, MRN [id number] Patient Location:  Home Provider location:   Office  Primary Care Provider:  Karie Schwalbe, MD Primary Cardiologist:  Julien Nordmann, MD  Chief Complaint / Patient Profile   89 y.o. y/o male with a h/o HFrEF but normal LVEF per echocardiogram in 2021 with EF of 60 to 65%., bradycardia, history of pheochromocytoma, right adrenal tumor status post right adrenalectomy, cochlear implant on the left on 06/26/2018, type 2 diabetes, chronic back pain, chronic kidney disease with cardiac catheterization on 11/28/2019 with severe single-vessel coronary artery disease with 90% ostial stenosis involving a small ramus intermediate to fills via right to left collaterals with moderate nonobstructive CAD involving the LAD left circumflex right coronary artery with upper normal to mildly elevated left and right heart filling pressures and mild to moderate AAS., aortic sclerosis, history of cardiomyopathy with syncope.    Who is pending cystoscopy with insertion of UroLift by Dr. Irineo Axon on 02/15/2023 and presents today for telephonic preoperative cardiovascular risk assessment.  History of Present Illness    Dustin Harrison is a 89 y.o. male  who presents via audio/video conferencing for a telehealth visit today.  Pt was last seen in cardiology clinic on 07/19/2018 for by Dr. Mariah Milling.  At that time Brigido Balandran was doing well .  The patient is now pending procedure as outlined above. Since his last visit, he   Past Medical History    Past Medical History:  Diagnosis Date   Aortic atherosclerosis (HCC)    Aortic stenosis 10/12/2019   a.) TEE 10/12/2019: visually mod AS (peak-peak gradient ~ 20 mmHg); b.) TTE 01/16/2020: mild-mod sclerosis with no stenosis (MPG 6; VTI = 2.47)   Arthritis    Ascending aorta dilatation (HCC) 01/16/2020   a.) TTE 01/16/2020: asc Ao = 39 mm   Bacteremia due to group B Streptococcus 10/07/2019   Baker's cyst of knee, left    BPH (benign prostatic hypertrophy)    CAD (coronary artery disease) 11/28/2019   a.) R/LHC 11/28/2019: 90% oRI (R-L collaterals), 50% o-pLAD - med mgmt   Cerebral microvascular disease    Chronic back pain    Chronic cough    a.) secondary to "tight" esophagus   Cochlear implant in place (LEFT) 06/27/2018   DDD (degenerative disc disease), cervical    DDD (degenerative disc disease), lumbar    DDD (degenerative disc disease), thoracic    Duodenal ulcer    Erectile dysfunction    a.) on PDE5i (tadalafil)   Glaucoma    HFrEF (heart failure with reduced ejection fraction) (HCC)    a.) TTE 10/08/2019: EF 25-30%, mod LVH, sev mid-apical/antlat/apical/infsep AK, mild LA dil, mild-mod AoV scler, RVSP 40 ; b.) TEE 10/06/2019: EF 25-30% glob HK, sev AoV scler with visually  mod AS (peak-peak grad ~20 mmHg); c.) R/LHC 11/28/2019: EF >65%. mRA 8, mPA 21, mPCWP 15, AO sat 97, PA sat 69, CO 5.8, CI 2.9; d.) TTE 01/16/2020: EF 60-65%, mild LVH, mild-mod AoV scler, asc Ao 39 mm, G1DD   History of CVA (cerebrovascular accident) 10/2019   Hyperlipidemia associated with type 2 diabetes mellitus (HCC)    Hypertension    Hypoglycemia unawareness associated with type 2 diabetes mellitus (HCC)     Osteoarthritis    Paralysis of right vocal cord 10/2019   a.) unknown etiology; ENT consulted--> DDx included idiopathic cause, such as a viral neuropathy, or a subhemispheric stroke   Pheochromocytoma of right adrenal gland    a.) s/p RIGHT adrenalectomy   PSVT (paroxysmal supraventricular tachycardia) 09/29/2020   a.) Zio 09/29/2020: PSVT x 16 runs --> fastest lasting 4 beats (max rate 174 bpm) and longest lasting 8 beats (max rate 93 bpm)   Sensorineural hearing loss (SNHL), bilateral    a.) wears BILATERAL hearing aids; b.) s/p LEFT cochlear implant   Stage 3b chronic kidney disease (CKD) (HCC)    Stress-induced cardiomyopathy 10/08/2019   a.) TTE 10/08/2019: EF 25-30%; b.) TEE 10/06/2019: EF 25-30%; c.) R/LHC 11/28/2019: EF >65%; d.) TTE 01/16/2020: EF 60-65%   Temporomandibular joint osteoarthritis (LEFT)    Type II diabetes mellitus with nephropathy (HCC)    Wears hearing aid    bilateral   Past Surgical History:  Procedure Laterality Date   ADRENALECTOMY Right    CATARACT EXTRACTION W/PHACO Left 10/08/2015   Procedure: CATARACT EXTRACTION PHACO AND INTRAOCULAR LENS PLACEMENT (IOC) left eye;  Surgeon: Lockie Mola, MD;  Location: [facility];  Service: Ophthalmology;  Laterality: Left;  MALYUGIN   COCHLEAR IMPLANT Left 06/27/2018   HERNIA REPAIR     RIGHT/LEFT HEART CATH AND CORONARY ANGIOGRAPHY Left 11/28/2019   Procedure: RIGHT/LEFT HEART CATH AND CORONARY ANGIOGRAPHY;  Surgeon: Yvonne Kendall, MD;  Location: [facility];  Service: Cardiovascular;  Laterality: Left;   SQUAMOUS CELL CARCINOMA EXCISION  03/2006   left ear   TEE WITHOUT CARDIOVERSION N/A 10/12/2019   Procedure: TRANSESOPHAGEAL ECHOCARDIOGRAM (TEE);  Surgeon: Antonieta Iba, MD;  Location: [facility];  Service: Cardiovascular;  Laterality: N/A;   TONSILLECTOMY     XI ROBOTIC LAPAROSCOPIC ASSISTED APPENDECTOMY N/A 10/11/2019   Procedure: XI ROBOTIC ASSISTED LAPAROSCOPIC INSERTION  GASTROSTOMY TUBE;  Surgeon: Leafy Ro, MD;  Location: [facility];  Service: General;  Laterality: N/A;    Allergies  No Known Allergies  Home Medications    Prior to Admission medications   Medication Sig Start Date End Date Taking? Authorizing Provider  amLODipine (NORVASC) 5 MG tablet TAKE ONE TABLET BY MOUTH TWICE DAILY 12/23/22   Antonieta Iba, MD  atorvastatin (LIPITOR) 20 MG tablet TAKE ONE TABLET BY MOUTH EVERY DAY 01/21/23   Hammock, Lavonna Rua, NP  benzonatate (TESSALON) 200 MG capsule Take 1 capsule (200 mg total) by mouth 3 (three) times daily as needed for cough. 12/20/22   Karie Schwalbe, MD  dorzolamide-timolol (COSOPT) 22.3-6.8 MG/ML ophthalmic solution Place 1 drop into both eyes 2 (two) times daily. 12/12/21   [provider]  ezetimibe (ZETIA) 10 MG tablet TAKE ONE TABLET BY MOUTH EVERY DAY 09/23/22   Charlsie Quest, NP  finasteride (PROPECIA) 1 MG tablet TAKE 1 TABLET BY MOUTH DAILY 12/23/22   Karie Schwalbe, MD  Lancets MISC 1 each by Does not apply route daily at 8 pm. 12/20/22   Tillman Abide  I, MD  latanoprost (XALATAN) 0.005 % ophthalmic solution Place 1 drop into both eyes at bedtime. 10/24/19   [provider]  losartan (COZAAR) 100 MG tablet TAKE ONE TABLET BY MOUTH EVERY DAY 01/21/23   Charlsie Quest, NP  metoprolol succinate (TOPROL-XL) 25 MG 24 hr tablet TAKE 1 TABLET BY MOUTH DAILY 10/26/22   Antonieta Iba, MD  pantoprazole (PROTONIX) 20 MG tablet TAKE 1 TABLET BY MOUTH DAILY 03/25/22   Tillman Abide I, MD  polyethylene glycol (MIRALAX / GLYCOLAX) 17 g packet Take 17 g by mouth daily.    [provider]  tadalafil (CIALIS) 5 MG tablet Take 1 tablet (5 mg total) by mouth daily. 11/26/22   Karie Schwalbe, MD  tamsulosin (FLOMAX) 0.4 MG CAPS capsule TAKE 2 CAPSULES EVERY DAY Patient taking differently: Take 0.8 mg by mouth daily. 09/23/22   Karie Schwalbe, MD    Physical Exam  Vital Signs:  Dustin Harrison does not have  vital signs available for review today.  Given telephonic nature of communication, physical exam is limited. AAOx3. NAD. Normal affect.  Speech and respirations are unlabored.  Accessory Clinical Findings    None  Assessment & Plan    1.  Preoperative Cardiovascular Risk Assessment:  According to the Revised Cardiac Risk Index (RCRI), his Perioperative Risk of Major Cardiac Event is (%): 0.4  His Functional Capacity in METs is: 8.91 according to the [facility] Activity Status Index (DASI).   The patient was advised that if he develops new symptoms prior to surgery to contact our office to arrange for a follow-up visit, and he verbalized understanding.  Therefore, based on ACC/AHA guidelines, patient would be at acceptable risk for the planned procedure without further cardiovascular testing. I will route this recommendation to the requesting party via Epic fax function.   A copy of this note will be routed to requesting surgeon.  Time:   Today, I have spent 10 minutes with the patient with telehealth technology discussing medical history, symptoms, and management plan.     Joni Reining, NP  02/11/2023, 2:40 PM

## 2023-02-11 NOTE — Telephone Encounter (Signed)
Pt sent a mychart message. I have sent a message back asking about the 20mg .

## 2023-02-14 MED ORDER — CEFAZOLIN SODIUM-DEXTROSE 2-4 GM/100ML-% IV SOLN
2.0000 g | INTRAVENOUS | Status: AC
  Administered 2023-02-15: 2 g via INTRAVENOUS

## 2023-02-14 MED ORDER — SODIUM CHLORIDE 0.9 % IV SOLN: INTRAVENOUS | Status: DC

## 2023-02-14 MED ORDER — CHLORHEXIDINE GLUCONATE 0.12 % MT SOLN
15.0000 mL | Freq: Once | OROMUCOSAL | Status: AC
  Administered 2023-02-15: 15 mL via OROMUCOSAL

## 2023-02-14 MED ORDER — ORAL CARE MOUTH RINSE: 15.0000 mL | Freq: Once | OROMUCOSAL | Status: AC

## 2023-02-15 ENCOUNTER — Encounter: Payer: Self-pay | Admitting: Urology

## 2023-02-15 ENCOUNTER — Other Ambulatory Visit: Payer: Self-pay

## 2023-02-15 ENCOUNTER — Encounter
Admission: RE | Disposition: A | Discharge status: Home / Self Care | Payer: Self-pay | Source: Home / Self Care | Attending: Urology

## 2023-02-15 ENCOUNTER — Ambulatory Visit
Admission: RE | Admit: 2023-02-15 | Discharge: 2023-02-15 | Disposition: A | Discharge status: Home / Self Care | Payer: Medicare PPO | Attending: Urology | Admitting: Urology

## 2023-02-15 ENCOUNTER — Ambulatory Visit: Payer: Medicare PPO | Admitting: Urgent Care

## 2023-02-15 DIAGNOSIS — I700 Atherosclerosis of aorta: Secondary | ICD-10-CM | POA: Insufficient documentation

## 2023-02-15 DIAGNOSIS — N35912 Unspecified bulbous urethral stricture, male: Secondary | ICD-10-CM | POA: Diagnosis not present

## 2023-02-15 DIAGNOSIS — E875 Hyperkalemia: Secondary | ICD-10-CM

## 2023-02-15 DIAGNOSIS — E1122 Type 2 diabetes mellitus with diabetic chronic kidney disease: Secondary | ICD-10-CM | POA: Diagnosis not present

## 2023-02-15 DIAGNOSIS — N1832 Chronic kidney disease, stage 3b: Secondary | ICD-10-CM | POA: Diagnosis not present

## 2023-02-15 DIAGNOSIS — M1990 Unspecified osteoarthritis, unspecified site: Secondary | ICD-10-CM | POA: Insufficient documentation

## 2023-02-15 DIAGNOSIS — R338 Other retention of urine: Secondary | ICD-10-CM | POA: Diagnosis not present

## 2023-02-15 DIAGNOSIS — I2510 Atherosclerotic heart disease of native coronary artery without angina pectoris: Secondary | ICD-10-CM | POA: Insufficient documentation

## 2023-02-15 DIAGNOSIS — G8929 Other chronic pain: Secondary | ICD-10-CM | POA: Insufficient documentation

## 2023-02-15 DIAGNOSIS — I5020 Unspecified systolic (congestive) heart failure: Secondary | ICD-10-CM

## 2023-02-15 DIAGNOSIS — Z8673 Personal history of transient ischemic attack (TIA), and cerebral infarction without residual deficits: Secondary | ICD-10-CM | POA: Diagnosis not present

## 2023-02-15 DIAGNOSIS — I5022 Chronic systolic (congestive) heart failure: Secondary | ICD-10-CM | POA: Insufficient documentation

## 2023-02-15 DIAGNOSIS — N401 Enlarged prostate with lower urinary tract symptoms: Secondary | ICD-10-CM | POA: Insufficient documentation

## 2023-02-15 DIAGNOSIS — J3801 Paralysis of vocal cords and larynx, unilateral: Secondary | ICD-10-CM | POA: Insufficient documentation

## 2023-02-15 DIAGNOSIS — I130 Hypertensive heart and chronic kidney disease with heart failure and stage 1 through stage 4 chronic kidney disease, or unspecified chronic kidney disease: Secondary | ICD-10-CM | POA: Diagnosis not present

## 2023-02-15 DIAGNOSIS — Z01812 Encounter for preprocedural laboratory examination: Secondary | ICD-10-CM

## 2023-02-15 DIAGNOSIS — I350 Nonrheumatic aortic (valve) stenosis: Secondary | ICD-10-CM | POA: Diagnosis not present

## 2023-02-15 DIAGNOSIS — E1121 Type 2 diabetes mellitus with diabetic nephropathy: Secondary | ICD-10-CM

## 2023-02-15 DIAGNOSIS — N138 Other obstructive and reflux uropathy: Secondary | ICD-10-CM

## 2023-02-15 HISTORY — DX: Sensorineural hearing loss, bilateral: H90.3

## 2023-02-15 HISTORY — DX: Atherosclerosis of aorta: I70.0

## 2023-02-15 HISTORY — DX: Other cervical disc degeneration, unspecified cervical region: M50.30

## 2023-02-15 HISTORY — DX: Chronic cough: R05.3

## 2023-02-15 HISTORY — DX: Other cerebrovascular disease: I67.89

## 2023-02-15 HISTORY — DX: Other intervertebral disc degeneration, thoracic region: M51.34

## 2023-02-15 HISTORY — DX: Other intervertebral disc degeneration, lumbar region without mention of lumbar back pain or lower extremity pain: M51.369

## 2023-02-15 HISTORY — PX: CYSTOSCOPY WITH INSERTION OF UROLIFT: SHX6678

## 2023-02-15 HISTORY — DX: Duodenal ulcer, unspecified as acute or chronic, without hemorrhage or perforation: K26.9

## 2023-02-15 HISTORY — DX: Primary osteoarthritis, other specified site: M19.09

## 2023-02-15 LAB — POCT I-STAT, CHEM 8
BUN: 28 mg/dL — ABNORMAL HIGH (ref 8–23)
Calcium, Ion: 1.2 mmol/L (ref 1.15–1.40)
Chloride: 104 mmol/L (ref 98–111)
Creatinine, Ser: 1.3 mg/dL — ABNORMAL HIGH (ref 0.61–1.24)
Glucose, Bld: 114 mg/dL — ABNORMAL HIGH (ref 70–99)
HCT: 46 % (ref 39.0–52.0)
Hemoglobin: 15.6 g/dL (ref 13.0–17.0)
Potassium: 4.9 mmol/L (ref 3.5–5.1)
Sodium: 140 mmol/L (ref 135–145)
TCO2: 25 mmol/L (ref 22–32)

## 2023-02-15 LAB — GLUCOSE, CAPILLARY
Glucose-Capillary: 120 mg/dL — ABNORMAL HIGH (ref 70–99)
Glucose-Capillary: 135 mg/dL — ABNORMAL HIGH (ref 70–99)

## 2023-02-15 SURGERY — CYSTOSCOPY WITH INSERTION OF UROLIFT
Anesthesia: General | Site: Bladder

## 2023-02-15 MED ORDER — CEFAZOLIN SODIUM-DEXTROSE 2-4 GM/100ML-% IV SOLN
INTRAVENOUS | Status: AC
  Filled 2023-02-15: qty 100

## 2023-02-15 MED ORDER — FENTANYL CITRATE (PF) 100 MCG/2ML IJ SOLN
INTRAMUSCULAR | Status: DC | PRN
  Administered 2023-02-15: 25 ug via INTRAVENOUS

## 2023-02-15 MED ORDER — GLYCOPYRROLATE 0.2 MG/ML IJ SOLN
INTRAMUSCULAR | Status: DC | PRN
Start: 2023-02-15 — End: 2023-02-15
  Administered 2023-02-15: .2 mg via INTRAVENOUS

## 2023-02-15 MED ORDER — FENTANYL CITRATE (PF) 100 MCG/2ML IJ SOLN: 25.0000 ug | INTRAMUSCULAR | Status: DC | PRN

## 2023-02-15 MED ORDER — DEXAMETHASONE SODIUM PHOSPHATE 10 MG/ML IJ SOLN
INTRAMUSCULAR | Status: DC | PRN
  Administered 2023-02-15: 5 mg via INTRAVENOUS

## 2023-02-15 MED ORDER — FENTANYL CITRATE (PF) 100 MCG/2ML IJ SOLN
INTRAMUSCULAR | Status: AC
  Filled 2023-02-15: qty 2

## 2023-02-15 MED ORDER — CHLORHEXIDINE GLUCONATE 0.12 % MT SOLN
OROMUCOSAL | Status: AC
  Filled 2023-02-15: qty 15

## 2023-02-15 MED ORDER — LIDOCAINE HCL (CARDIAC) PF 100 MG/5ML IV SOSY
PREFILLED_SYRINGE | INTRAVENOUS | Status: DC | PRN
Start: 2023-02-15 — End: 2023-02-15
  Administered 2023-02-15: 100 mg via INTRATRACHEAL

## 2023-02-15 MED ORDER — PROPOFOL 10 MG/ML IV BOLUS
INTRAVENOUS | Status: AC
  Filled 2023-02-15: qty 20

## 2023-02-15 MED ORDER — PROPOFOL 500 MG/50ML IV EMUL
INTRAVENOUS | Status: DC | PRN
  Administered 2023-02-15: 20 mg via INTRAVENOUS
  Administered 2023-02-15: 40 mg via INTRAVENOUS
  Administered 2023-02-15: 10 mg via INTRAVENOUS
  Administered 2023-02-15: 50 ug/kg/min via INTRAVENOUS

## 2023-02-15 MED ORDER — ACETAMINOPHEN 10 MG/ML IV SOLN
INTRAVENOUS | Status: DC | PRN
  Administered 2023-02-15: 1000 mg via INTRAVENOUS

## 2023-02-15 MED ORDER — OXYCODONE HCL 5 MG PO TABS
ORAL_TABLET | ORAL | Status: AC
  Filled 2023-02-15: qty 1

## 2023-02-15 MED ORDER — STERILE WATER FOR IRRIGATION IR SOLN
Status: DC | PRN
Start: 2023-02-15 — End: 2023-02-15
  Administered 2023-02-15: 500 mL

## 2023-02-15 MED ORDER — ONDANSETRON HCL 4 MG/2ML IJ SOLN
INTRAMUSCULAR | Status: DC | PRN
  Administered 2023-02-15: 4 mg via INTRAVENOUS

## 2023-02-15 MED ORDER — OXYCODONE HCL 5 MG PO TABS
5.0000 mg | ORAL_TABLET | Freq: Once | ORAL | Status: AC
  Administered 2023-02-15: 5 mg via ORAL

## 2023-02-15 SURGICAL SUPPLY — 21 items
BAG DRAIN SIEMENS DORNER NS (MISCELLANEOUS) ×1 IMPLANT
BAG DRN NS LF (MISCELLANEOUS) ×1
BRUSH SCRUB EZ 4% CHG (MISCELLANEOUS) ×1 IMPLANT
CATH FOL 2WAY LX 16X5 (CATHETERS) IMPLANT
CATH SET URETHRAL DILATOR (CATHETERS) IMPLANT
GLOVE BIOGEL PI IND STRL 7.5 (GLOVE) ×1 IMPLANT
GOWN STRL REUS W/ TWL LRG LVL3 (GOWN DISPOSABLE) ×1 IMPLANT
GOWN STRL REUS W/ TWL XL LVL3 (GOWN DISPOSABLE) ×1 IMPLANT
GOWN STRL REUS W/TWL LRG LVL3 (GOWN DISPOSABLE) ×1
GOWN STRL REUS W/TWL XL LVL3 (GOWN DISPOSABLE) ×1
GUIDEWIRE STR DUAL SENSOR (WIRE) IMPLANT
GUIDEWIRE STR ZIPWIRE 035X150 (MISCELLANEOUS) IMPLANT
KIT TURNOVER CYSTO (KITS) ×1 IMPLANT
PACK CYSTO AR (MISCELLANEOUS) ×1 IMPLANT
SET CYSTO W/LG BORE CLAMP LF (SET/KITS/TRAYS/PACK) ×1 IMPLANT
SURGILUBE 2OZ TUBE FLIPTOP (MISCELLANEOUS) IMPLANT
SYSTEM UROLIFT 2 CART W/ HNDL (Male Continence) ×1 IMPLANT
SYSTEM UROLIFT 2 CARTRIDGE (Male Continence) IMPLANT
WATER STERILE IRR 1000ML POUR (IV SOLUTION) ×1 IMPLANT
WATER STERILE IRR 3000ML UROMA (IV SOLUTION) ×1 IMPLANT
WATER STERILE IRR 500ML POUR (IV SOLUTION) ×1 IMPLANT

## 2023-02-15 NOTE — Anesthesia Postprocedure Evaluation (Signed)
Anesthesia Post Note  Patient: Dustin Harrison  Procedure(s) Performed: CYSTOSCOPY WITH INSERTION OF UROLIFT (Bladder)  Patient location during evaluation: PACU Anesthesia Type: General Level of consciousness: awake and alert Pain management: pain level controlled Vital Signs Assessment: post-procedure vital signs reviewed and stable Respiratory status: spontaneous breathing, nonlabored ventilation, respiratory function stable and patient connected to nasal cannula oxygen Cardiovascular status: blood pressure returned to baseline and stable Postop Assessment: no apparent nausea or vomiting Anesthetic complications: no  No notable events documented.   Last Vitals:  Vitals:   02/15/23 1545 02/15/23 1557  BP: (!) 157/81 (!) 143/84  Pulse: 63 66  Resp: (!) 21 15  Temp:  36.7 C  SpO2: 97% 95%    Last Pain:  Vitals:   02/15/23 1615  TempSrc:   PainSc: 3                  Stephanie Coup

## 2023-02-15 NOTE — Op Note (Incomplete)
   Preoperative diagnosis: BPH with LUTS  Postoperative diagnosis:  BPH with LUTS Bulbar urethral stricture  Procedure:  Urolift (placement of 4 implants) Dilation urethral stricture  Surgeon: Lonna Cobb  Anesthesia: MAC  Complications: None  Drains: 16 French Foley catheter  Estimated blood loss: < 5 mL  Indications: 89 y.o. with severe LUTS and incomplete bladder emptying.  Prostate volume by CT 45 g and office cystoscopy showed moderate lateral lobe enlargement without median lobe. The patient has requested further management.  Management options including TURP with resection/ablation of the prostate as well as Urolift were discussed.  The patient has chosen to have a Urolift procedure.  He has been instructed to the procedure as well as risks and complications which include but are not limited to infection, bleeding, and inadequate treatment with the Urolift procedure alone, anesthetic complications, among others.  He understands these and desires to proceed.  Findings: Using the 17 French cystoscope, urethra and bladder were inspected.  A bulbar urethral stricture was noted just small enough to where the 17 [place] scope would not advance.  Prostatic urethra showed moderate lateral lobe enlargement.  The bladder was inspected circumferentially.  This revealed normal findings.  Description: The patient was properly identified in the holding area.  He received preoperative IV antibiotics.  He was taken to the operating room where IV sedation was obtained by anesthesia.  He is placed in the dorsolithotomy position.  Genitalia and perineum were prepped and draped.  Proper timeout was performed.  A 59F cystoscope was inserted into the urethra where a stricture was identified.  A guidewire was placed through the cystoscope and advanced through the stricture and into the bladder.  The stricture was dilated with disposable urethral dilators from 12-18 [place].  The cystoscope was then advanced in  the bladder and panendoscopy was performed.    The cystoscopy bridge was replaced with a UroLift II delivery device.  The 1st pair of implants were placed 2 cm from the bladder neck.  The 2nd pair of implants were placed just proximal to the verumontanum. The insert was placed in the delivery and cystoscopy was performed.  With the cystoscope placed at the verumontanum there was a continuous anterior channel.  No occlusive tissue was noted in the midportion of the prostatic urethra and no additional implants were placed.  Mild oozing was noted and a 16 French Foley catheter was placed to gravity drainage.  Plan: Will keep indwelling Foley x 24 hours 1 month postop follow-up with IPSS/PVR   Irineo Axon, MD

## 2023-02-15 NOTE — Anesthesia Preprocedure Evaluation (Addendum)
Anesthesia Evaluation  Patient identified by MRN, date of birth, ID band Patient awake    Reviewed: Allergy & Precautions, H&P , NPO status , Patient's Chart, lab work & pertinent test results, reviewed documented beta blocker date and time   History of Anesthesia Complications Negative for: history of anesthetic complications  Airway Mallampati: III  TM Distance: >3 FB Neck ROM: full    Dental  (+) Dental Advidsory Given   Pulmonary neg pulmonary ROS, Continuous Positive Airway Pressure Ventilation    Pulmonary exam normal breath sounds clear to auscultation       Cardiovascular Exercise Tolerance: Good hypertension, (-) angina + CAD  (-) Past MI and (-) Cardiac Stents Normal cardiovascular exam(-) dysrhythmias (-) Valvular Problems/Murmurs Rhythm:regular Rate:Normal     Neuro/Psych negative neurological ROS  negative psych ROS   GI/Hepatic Neg liver ROS, PUD,,,  Endo/Other  diabetes    Renal/GU Renal disease  negative genitourinary   Musculoskeletal   Abdominal   Peds  Hematology negative hematology ROS (+)   Anesthesia Other Findings Past Medical History: No date: Aortic atherosclerosis (HCC) 10/12/2019: Aortic stenosis     Comment:  a.) TEE 10/12/2019: visually mod AS (peak-peak gradient               ~ 20 mmHg); b.) TTE 01/16/2020: mild-mod sclerosis with               no stenosis (MPG 6; VTI = 2.47) No date: Arthritis 01/16/2020: Ascending aorta dilatation (HCC)     Comment:  a.) TTE 01/16/2020: asc Ao = 39 mm 10/07/2019: Bacteremia due to group B Streptococcus No date: Baker's cyst of knee, left No date: BPH (benign prostatic hypertrophy) 11/28/2019: CAD (coronary artery disease)     Comment:  a.) R/LHC 11/28/2019: 90% oRI (R-L collaterals), 50%               o-pLAD - med mgmt No date: Cerebral microvascular disease No date: Chronic back pain No date: Chronic cough     Comment:  a.) secondary to  "tight" esophagus 06/27/2018: Cochlear implant in place (LEFT) No date: DDD (degenerative disc disease), cervical No date: DDD (degenerative disc disease), lumbar No date: DDD (degenerative disc disease), thoracic No date: Duodenal ulcer No date: Erectile dysfunction     Comment:  a.) on PDE5i (tadalafil) No date: Glaucoma No date: HFrEF (heart failure with reduced ejection fraction) (HCC)     Comment:  a.) TTE 10/08/2019: EF 25-30%, mod LVH, sev               mid-apical/antlat/apical/infsep AK, mild LA dil, mild-mod              AoV scler, RVSP 40 ; b.) TEE 10/06/2019: EF 25-30% glob               HK, sev AoV scler with visually mod AS (peak-peak grad               ~20 mmHg); c.) R/LHC 11/28/2019: EF >65%. mRA 8, mPA 21,               mPCWP 15, AO sat 97, PA sat 69, CO 5.8, CI 2.9; d.) TTE               01/16/2020: EF 60-65%, mild LVH, mild-mod AoV scler, asc               Ao 39 mm, G1DD 10/2019: History of CVA (cerebrovascular accident) No date: Hyperlipidemia associated with type  2 diabetes mellitus (HCC) No date: Hypertension No date: Hypoglycemia unawareness associated with type 2 diabetes  mellitus (HCC) No date: Osteoarthritis 10/2019: Paralysis of right vocal cord     Comment:  a.) unknown etiology; ENT consulted--> DDx included               idiopathic cause, such as a viral neuropathy, or a               subhemispheric stroke No date: Pheochromocytoma of right adrenal gland     Comment:  a.) s/p RIGHT adrenalectomy 09/29/2020: PSVT (paroxysmal supraventricular tachycardia)     Comment:  a.) Zio 09/29/2020: PSVT x 16 runs --> fastest lasting 4              beats (max rate 174 bpm) and longest lasting 8 beats (max              rate 93 bpm) No date: Sensorineural hearing loss (SNHL), bilateral     Comment:  a.) wears BILATERAL hearing aids; b.) s/p LEFT cochlear               implant No date: Stage 3b chronic kidney disease (CKD) (HCC) 10/08/2019: Stress-induced  cardiomyopathy     Comment:  a.) TTE 10/08/2019: EF 25-30%; b.) TEE 10/06/2019: EF               25-30%; c.) R/LHC 11/28/2019: EF >65%; d.) TTE               01/16/2020: EF 60-65% No date: Temporomandibular joint osteoarthritis (LEFT) No date: Type II diabetes mellitus with nephropathy (HCC) No date: Wears hearing aid     Comment:  bilateral   Reproductive/Obstetrics negative OB ROS                             Anesthesia Physical Anesthesia Plan  ASA: 2  Anesthesia Plan: General   Post-op Pain Management:    Induction: Intravenous  PONV Risk Score and Plan: 2 and Propofol infusion and TIVA  Airway Management Planned: Natural Airway and Nasal Cannula  Additional Equipment:   Intra-op Plan:   Post-operative Plan: Extubation in OR  Informed Consent: I have reviewed the patients History and Physical, chart, labs and discussed the procedure including the risks, benefits and alternatives for the proposed anesthesia with the patient or authorized representative who has indicated his/her understanding and acceptance.     Dental Advisory Given  Plan Discussed with: Anesthesiologist, CRNA and Surgeon  Anesthesia Plan Comments:        Anesthesia Quick Evaluation

## 2023-02-15 NOTE — Anesthesia Procedure Notes (Signed)
Procedure Name: General with mask airway Date/Time: 02/15/2023 2:38 PM  Performed by: Mohammed Kindle, CRNAPre-anesthesia Checklist: Patient identified, Emergency Drugs available, Suction available and Patient being monitored Patient Re-evaluated:Patient Re-evaluated prior to induction Oxygen Delivery Method: Simple face mask Induction Type: IV induction

## 2023-02-15 NOTE — Discharge Instructions (Signed)
Urolift Post-Operative Instructions   1. Mild blood in the urine for about 1 week.  2. Dysuria, frequency, and urgency for 10 days.  3. Mild pelvic pain 1-2 weeks.      Return to Activity     1. Drink water post procedure.  2. Take meds as needed.  3. No lifting or straining 48hrs.  4. Other activity when you feel up to it.     Medications   1.  AZO for burning if needed.  If this does not help contact the office  2. Continue BPH Medications (tamsulosin/finasteride) until follow-up visit     Follow-Up   1. Catheter removal next AM if needed  2. Visit at 4wks, 78mo and annually with bladder scan.

## 2023-02-15 NOTE — H&P (Signed)
Urology H&P   History of Present Illness: Dustin Harrison is a 89 y.o. male with incomplete bladder emptying and lower urinary tract symptoms who presents today for UroLift.  Severe lower urinary tract symptoms with IPSS 30/35 and PVR ranging between 238-325 mL His most bothersome symptom was nocturia x 4 though does have bothersome obstructive and storage related daytime voiding symptoms Was on tamsulosin/finasteride.  Dustin Harrison was added without improvement in his storage related voiding symptoms. He requested UroLift and prostate volume by prior CT was 45 g and recent cystoscopy with moderate lateral lobe enlargement and no median lobe   Past Medical History:  Diagnosis Date   Aortic atherosclerosis (HCC)    Aortic stenosis 10/12/2019   a.) TEE 10/12/2019: visually mod AS (peak-peak gradient ~ 20 mmHg); b.) TTE 01/16/2020: mild-mod sclerosis with no stenosis (MPG 6; VTI = 2.47)   Arthritis    Ascending aorta dilatation (HCC) 01/16/2020   a.) TTE 01/16/2020: asc Ao = 39 mm   Bacteremia due to group B Streptococcus 10/07/2019   Baker's cyst of knee, left    BPH (benign prostatic hypertrophy)    CAD (coronary artery disease) 11/28/2019   a.) R/LHC 11/28/2019: 90% oRI (R-L collaterals), 50% o-pLAD - med mgmt   Cerebral microvascular disease    Chronic back pain    Chronic cough    a.) secondary to "tight" esophagus   Cochlear implant in place (LEFT) 06/27/2018   DDD (degenerative disc disease), cervical    DDD (degenerative disc disease), lumbar    DDD (degenerative disc disease), thoracic    Duodenal ulcer    Erectile dysfunction    a.) on PDE5i (tadalafil)   Glaucoma    HFrEF (heart failure with reduced ejection fraction) (HCC)    a.) TTE 10/08/2019: EF 25-30%, mod LVH, sev mid-apical/antlat/apical/infsep AK, mild LA dil, mild-mod AoV scler, RVSP 40 ; b.) TEE 10/06/2019: EF 25-30% glob HK, sev AoV scler with visually mod AS (peak-peak grad ~20 mmHg); c.) R/LHC 11/28/2019: EF  >65%. mRA 8, mPA 21, mPCWP 15, AO sat 97, PA sat 69, CO 5.8, CI 2.9; d.) TTE 01/16/2020: EF 60-65%, mild LVH, mild-mod AoV scler, asc Ao 39 mm, G1DD   History of CVA (cerebrovascular accident) 10/2019   Hyperlipidemia associated with type 2 diabetes mellitus (HCC)    Hypertension    Hypoglycemia unawareness associated with type 2 diabetes mellitus (HCC)    Osteoarthritis    Paralysis of right vocal cord 10/2019   a.) unknown etiology; ENT consulted--> DDx included idiopathic cause, such as a viral neuropathy, or a subhemispheric stroke   Pheochromocytoma of right adrenal gland    a.) s/p RIGHT adrenalectomy   PSVT (paroxysmal supraventricular tachycardia) 09/29/2020   a.) Zio 09/29/2020: PSVT x 16 runs --> fastest lasting 4 beats (max rate 174 bpm) and longest lasting 8 beats (max rate 93 bpm)   Sensorineural hearing loss (SNHL), bilateral    a.) wears BILATERAL hearing aids; b.) s/p LEFT cochlear implant   Stage 3b chronic kidney disease (CKD) (HCC)    Stress-induced cardiomyopathy 10/08/2019   a.) TTE 10/08/2019: EF 25-30%; b.) TEE 10/06/2019: EF 25-30%; c.) R/LHC 11/28/2019: EF >65%; d.) TTE 01/16/2020: EF 60-65%   Temporomandibular joint osteoarthritis (LEFT)    Type II diabetes mellitus with nephropathy (HCC)    Wears hearing aid    bilateral    Past Surgical History:  Procedure Laterality Date   ADRENALECTOMY Right    CATARACT EXTRACTION W/PHACO Left 10/08/2015  Procedure: CATARACT EXTRACTION PHACO AND INTRAOCULAR LENS PLACEMENT (IOC) left eye;  Surgeon: Lockie Mola, MD;  Location: [facility];  Service: Ophthalmology;  Laterality: Left;  MALYUGIN   COCHLEAR IMPLANT Left 06/27/2018   HERNIA REPAIR     RIGHT/LEFT HEART CATH AND CORONARY ANGIOGRAPHY Left 11/28/2019   Procedure: RIGHT/LEFT HEART CATH AND CORONARY ANGIOGRAPHY;  Surgeon: Yvonne Kendall, MD;  Location: [facility];  Service: Cardiovascular;  Laterality: Left;   SQUAMOUS CELL CARCINOMA  EXCISION  03/2006   left ear   TEE WITHOUT CARDIOVERSION N/A 10/12/2019   Procedure: TRANSESOPHAGEAL ECHOCARDIOGRAM (TEE);  Surgeon: Antonieta Iba, MD;  Location: [facility];  Service: Cardiovascular;  Laterality: N/A;   TONSILLECTOMY     XI ROBOTIC LAPAROSCOPIC ASSISTED APPENDECTOMY N/A 10/11/2019   Procedure: XI ROBOTIC ASSISTED LAPAROSCOPIC INSERTION GASTROSTOMY TUBE;  Surgeon: Leafy Ro, MD;  Location: [facility];  Service: General;  Laterality: N/A;    Home Medications:  Current Meds  Medication Sig   amLODipine (NORVASC) 5 MG tablet TAKE ONE TABLET BY MOUTH TWICE DAILY   atorvastatin (LIPITOR) 20 MG tablet TAKE ONE TABLET BY MOUTH EVERY DAY   benzonatate (TESSALON) 200 MG capsule Take 1 capsule (200 mg total) by mouth 3 (three) times daily as needed for cough.   dorzolamide-timolol (COSOPT) 22.3-6.8 MG/ML ophthalmic solution Place 1 drop into both eyes 2 (two) times daily.   ezetimibe (ZETIA) 10 MG tablet TAKE ONE TABLET BY MOUTH EVERY DAY   finasteride (PROPECIA) 1 MG tablet TAKE 1 TABLET BY MOUTH DAILY   Lancets MISC 1 each by Does not apply route daily at 8 pm.   latanoprost (XALATAN) 0.005 % ophthalmic solution Place 1 drop into both eyes at bedtime.   losartan (COZAAR) 100 MG tablet TAKE ONE TABLET BY MOUTH EVERY DAY   metoprolol succinate (TOPROL-XL) 25 MG 24 hr tablet TAKE 1 TABLET BY MOUTH DAILY   pantoprazole (PROTONIX) 20 MG tablet TAKE 1 TABLET BY MOUTH DAILY   polyethylene glycol (MIRALAX / GLYCOLAX) 17 g packet Take 17 g by mouth daily.   tadalafil (CIALIS) 5 MG tablet Take 1 tablet (5 mg total) by mouth daily.   tamsulosin (FLOMAX) 0.4 MG CAPS capsule TAKE 2 CAPSULES EVERY DAY (Patient taking differently: Take 0.8 mg by mouth daily.)    Allergies: No Known Allergies  Family History  Problem Relation Age of Onset   Alcohol abuse Mother    Coronary artery disease Mother    Brain cancer Father        tumor   Diabetes Brother     Social History:  reports  that he has never smoked. He has never been exposed to tobacco smoke. He has never used smokeless tobacco. He reports that he does not currently use alcohol after a past usage of about 1.0 standard drink of alcohol per week. He reports that he does not use drugs.  ROS: Noncontributory except as per the HPI  Physical Exam:  Vital signs in last 24 hours: Temp:  [97.6 F (36.4 C)] 97.6 F (36.4 C) (09/10 1224) Pulse Rate:  [55] 55 (09/10 1224) Resp:  [14] 14 (09/10 1224) BP: (153)/(77) 153/77 (09/10 1224) SpO2:  [99 %] 99 % (09/10 1224) Weight:  [79.4 kg] 79.4 kg (09/10 1224) Constitutional:  Alert and oriented, No acute distress HEENT: [place] AT Respiratory: Normal respiratory effort, lungs clear bilaterally   Laboratory Data:  Recent Labs    02/15/23 1229  HGB 15.6  HCT 46.0  Recent Labs    02/15/23 1229  NA 140  K 4.9  CL 104  GLUCOSE 114*  BUN 28*  CREATININE 1.30*   No results for input(s): "LABPT", "INR" in the last 72 hours. No results for input(s): "LABURIN" in the last 72 hours. Results for orders placed or performed in visit on 01/10/23  CULTURE, URINE COMPREHENSIVE     Status: None   Collection Time: 01/10/23  3:15 PM   Specimen: Urine   UR  Result Value Ref Range Status   Urine Culture, Comprehensive Final report  Final   Organism ID, Bacteria Comment  Final    Comment: Mixed urogenital flora 4,000 Colonies/mL      Impression/Assessment:  BPH with severe LUTS  Plan:  Presents for UroLift The procedure has been discussed in detail including the most common side effects of urinary frequency, urgency, dysuria and hematuria We discussed there is an 80% chance his voiding symptoms will improve post procedure though we did discuss that nocturia may not significantly improve. All questions were answered and he desires to proceed   02/15/2023, 3:19 PM  Irineo Axon,  MD

## 2023-02-15 NOTE — Transfer of Care (Signed)
Immediate Anesthesia Transfer of Care Note  Patient: Dustin Harrison  Procedure(s) Performed: CYSTOSCOPY WITH INSERTION OF UROLIFT (Bladder)  Patient Location: PACU  Anesthesia Type:General  Level of Consciousness: drowsy  Airway & Oxygen Therapy: Patient Spontanous Breathing and Patient connected to face mask oxygen  Post-op Assessment: Report given to RN and Post -op Vital signs reviewed and stable  Post vital signs: Reviewed and stable  Last Vitals:  Vitals Value Taken Time  BP 129/73 02/15/23 1519  Temp    Pulse 65 02/15/23 1521  Resp 21 02/15/23 1521  SpO2 97 % 02/15/23 1521  Vitals shown include unfiled device data.  Last Pain:  Vitals:   02/15/23 1224  TempSrc: Temporal  PainSc: 0-No pain      Patients Stated Pain Goal: 0 (02/15/23 1224)  Complications: No notable events documented.

## 2023-02-16 ENCOUNTER — Ambulatory Visit (INDEPENDENT_AMBULATORY_CARE_PROVIDER_SITE_OTHER): Payer: Medicare PPO | Admitting: Urology

## 2023-02-16 ENCOUNTER — Encounter: Payer: Self-pay | Admitting: Urology

## 2023-02-16 DIAGNOSIS — N138 Other obstructive and reflux uropathy: Secondary | ICD-10-CM

## 2023-02-16 DIAGNOSIS — N401 Enlarged prostate with lower urinary tract symptoms: Secondary | ICD-10-CM

## 2023-02-16 NOTE — Progress Notes (Signed)
Patient came in to the office today for his cath removal. He got in the room and states he pull it out last night. He states he is not in any pain or blood in his urine. He is urinating just fine per patient.

## 2023-02-17 MED ORDER — TADALAFIL 5 MG PO TABS: 2.5000 mg | ORAL_TABLET | Freq: Every day | ORAL | 3 refills | Status: DC

## 2023-03-02 DIAGNOSIS — D0439 Carcinoma in situ of skin of other parts of face: Secondary | ICD-10-CM | POA: Diagnosis not present

## 2023-03-02 DIAGNOSIS — C44329 Squamous cell carcinoma of skin of other parts of face: Secondary | ICD-10-CM | POA: Diagnosis not present

## 2023-03-04 ENCOUNTER — Encounter: Payer: Self-pay | Admitting: Urology

## 2023-03-04 ENCOUNTER — Ambulatory Visit: Payer: Medicare PPO | Admitting: Urology

## 2023-03-28 ENCOUNTER — Other Ambulatory Visit: Payer: Self-pay | Admitting: Internal Medicine

## 2023-03-30 DIAGNOSIS — D0439 Carcinoma in situ of skin of other parts of face: Secondary | ICD-10-CM | POA: Diagnosis not present

## 2023-04-03 ENCOUNTER — Encounter: Payer: Self-pay | Admitting: Internal Medicine

## 2023-04-09 ENCOUNTER — Encounter: Payer: Self-pay | Admitting: Internal Medicine

## 2023-04-11 MED ORDER — ONETOUCH ULTRA VI STRP: ORAL_STRIP | 3 refills | Status: AC

## 2023-04-14 ENCOUNTER — Ambulatory Visit: Payer: Medicare PPO | Admitting: Urology

## 2023-04-15 ENCOUNTER — Encounter: Payer: Self-pay | Admitting: Internal Medicine

## 2023-04-15 ENCOUNTER — Encounter: Payer: Self-pay | Admitting: Urology

## 2023-04-15 MED ORDER — DEXCOM G7 RECEIVER DEVI: 1.0000 | Freq: Once | 0 refills | Status: AC

## 2023-04-15 MED ORDER — FREESTYLE LIBRE 3 PLUS SENSOR MISC: 11 refills | Status: DC

## 2023-04-15 MED ORDER — FREESTYLE LIBRE 3 READER DEVI: 1.0000 | Freq: Once | 0 refills | Status: AC

## 2023-04-15 MED ORDER — DEXCOM G7 SENSOR MISC: 11 refills | Status: DC

## 2023-04-15 NOTE — Addendum Note (Signed)
Addended by: Eual Fines on: 04/15/2023 11:53 AM   Modules accepted: Orders

## 2023-04-16 MED ORDER — BENZONATATE 200 MG PO CAPS: 200.0000 mg | ORAL_CAPSULE | Freq: Three times a day (TID) | ORAL | 0 refills | Status: AC | PRN

## 2023-04-25 DIAGNOSIS — H401132 Primary open-angle glaucoma, bilateral, moderate stage: Secondary | ICD-10-CM | POA: Diagnosis not present

## 2023-04-27 ENCOUNTER — Other Ambulatory Visit: Payer: Self-pay | Admitting: Cardiovascular Disease

## 2023-04-28 DIAGNOSIS — H401132 Primary open-angle glaucoma, bilateral, moderate stage: Secondary | ICD-10-CM | POA: Diagnosis not present

## 2023-04-28 DIAGNOSIS — E119 Type 2 diabetes mellitus without complications: Secondary | ICD-10-CM | POA: Diagnosis not present

## 2023-04-28 DIAGNOSIS — Z961 Presence of intraocular lens: Secondary | ICD-10-CM | POA: Diagnosis not present

## 2023-04-28 LAB — HM DIABETES EYE EXAM

## 2023-05-26 ENCOUNTER — Other Ambulatory Visit: Payer: Self-pay | Admitting: Cardiovascular Disease

## 2023-06-13 DIAGNOSIS — H903 Sensorineural hearing loss, bilateral: Secondary | ICD-10-CM | POA: Diagnosis not present

## 2023-07-14 ENCOUNTER — Ambulatory Visit: Payer: Medicare PPO | Admitting: Internal Medicine

## 2023-07-15 ENCOUNTER — Ambulatory Visit: Payer: Medicare PPO | Admitting: Internal Medicine

## 2023-07-15 ENCOUNTER — Encounter: Payer: Self-pay | Admitting: Internal Medicine

## 2023-07-15 VITALS — BP 130/88 | HR 80 | Temp 98.8°F | Ht 71.0 in | Wt 185.0 lb

## 2023-07-15 DIAGNOSIS — N401 Enlarged prostate with lower urinary tract symptoms: Secondary | ICD-10-CM | POA: Diagnosis not present

## 2023-07-15 DIAGNOSIS — N1832 Chronic kidney disease, stage 3b: Secondary | ICD-10-CM

## 2023-07-15 DIAGNOSIS — I5020 Unspecified systolic (congestive) heart failure: Secondary | ICD-10-CM | POA: Diagnosis not present

## 2023-07-15 DIAGNOSIS — E1121 Type 2 diabetes mellitus with diabetic nephropathy: Secondary | ICD-10-CM

## 2023-07-15 LAB — POCT GLYCOSYLATED HEMOGLOBIN (HGB A1C): Hemoglobin A1C: 7.1 % — AB (ref 4.0–5.6)

## 2023-07-15 NOTE — Assessment & Plan Note (Signed)
 Continues on the losartan  100mg 

## 2023-07-15 NOTE — Assessment & Plan Note (Addendum)
 Compensated on losartan , metoprolol  25 daily Increased weight does not seem to be fluid

## 2023-07-15 NOTE — Assessment & Plan Note (Signed)
 A1c up a little to 7.1% No medications

## 2023-07-15 NOTE — Assessment & Plan Note (Signed)
 On finasteride  and tamsulosin  Feels his voiding is better since the Urolift procedure

## 2023-07-15 NOTE — Addendum Note (Signed)
 Addended by: Franne Ivory on: 07/15/2023 10:53 AM   Modules accepted: Orders

## 2023-07-15 NOTE — Progress Notes (Signed)
 Subjective:    Patient ID: Dustin Harrison, male    DOB: July 12, 1931, 89 y.o.   MRN: [id number]  HPI Here for follow up of diabetes and other chronic health conditions  Had urolift procedure in September---[facility] Voiding better Has better control and decreased frequency  Not regularly checking sugars--rare No dexcom for now  No chest pain or SOB  No dizziness or syncope No edema  Last GFR was down from 50's to 40  Current Outpatient Medications on File Prior to Visit  Medication Sig Dispense Refill   amLODipine  (NORVASC ) 5 MG tablet TAKE ONE TABLET BY MOUTH TWICE DAILY 180 tablet 0   atorvastatin  (LIPITOR) 20 MG tablet TAKE ONE TABLET BY MOUTH EVERY DAY 90 tablet 2   benzonatate  (TESSALON ) 200 MG capsule Take 1 capsule (200 mg total) by mouth 3 (three) times daily as needed for cough. 60 capsule 0   dorzolamide-timolol  (COSOPT) 22.3-6.8 MG/ML ophthalmic solution Place 1 drop into both eyes 2 (two) times daily.     ezetimibe  (ZETIA ) 10 MG tablet TAKE ONE TABLET BY MOUTH EVERY DAY 90 tablet 3   finasteride  (PROPECIA ) 1 MG tablet TAKE 1 TABLET BY MOUTH DAILY 90 tablet 3   glucose blood (ONETOUCH ULTRA) test strip Use to check blood sugar once daily. 100 each 3   Lancets MISC 1 each by Does not apply route daily at 8 pm. 100 each 3   latanoprost  (XALATAN ) 0.005 % ophthalmic solution Place 1 drop into both eyes at bedtime.     losartan  (COZAAR ) 100 MG tablet TAKE ONE TABLET BY MOUTH EVERY DAY 90 tablet 2   metoprolol  succinate (TOPROL -XL) 25 MG 24 hr tablet TAKE 1 TABLET BY MOUTH DAILY 30 tablet 3   pantoprazole  (PROTONIX ) 20 MG tablet TAKE 1 TABLET BY MOUTH DAILY 90 tablet 3   polyethylene glycol (MIRALAX  / GLYCOLAX ) 17 g packet Take 17 g by mouth daily.     tadalafil  (CIALIS ) 5 MG tablet Take 0.5-1 tablets (2.5-5 mg total) by mouth daily. 90 tablet 3   tamsulosin  (FLOMAX ) 0.4 MG CAPS capsule TAKE 2 CAPSULES EVERY DAY (Patient taking differently: Take 0.8 mg by mouth daily.) 180  capsule 3   No current facility-administered medications on file prior to visit.    No Known Allergies  Past Medical History:  Diagnosis Date   Aortic atherosclerosis (HCC)    Aortic stenosis 10/12/2019   a.) TEE 10/12/2019: visually mod AS (peak-peak gradient ~ 20 mmHg); b.) TTE 01/16/2020: mild-mod sclerosis with no stenosis (MPG 6; VTI = 2.47)   Arthritis    Ascending aorta dilatation (HCC) 01/16/2020   a.) TTE 01/16/2020: asc Ao = 39 mm   Bacteremia due to group B Streptococcus 10/07/2019   Baker's cyst of knee, left    BPH (benign prostatic hypertrophy)    CAD (coronary artery disease) 11/28/2019   a.) R/LHC 11/28/2019: 90% oRI (R-L collaterals), 50% o-pLAD - med mgmt   Cerebral microvascular disease    Chronic back pain    Chronic cough    a.) secondary to tight esophagus   Cochlear implant in place (LEFT) 06/27/2018   DDD (degenerative disc disease), cervical    DDD (degenerative disc disease), lumbar    DDD (degenerative disc disease), thoracic    Duodenal ulcer    Erectile dysfunction    a.) on PDE5i (tadalafil )   Glaucoma    HFrEF (heart failure with reduced ejection fraction) (HCC)    a.) TTE 10/08/2019: EF 25-30%, mod LVH,  sev mid-apical/antlat/apical/infsep AK, mild LA dil, mild-mod AoV scler, RVSP 40 ; b.) TEE 10/06/2019: EF 25-30% glob HK, sev AoV scler with visually mod AS (peak-peak grad ~20 mmHg); c.) R/LHC 11/28/2019: EF >65%. mRA 8, mPA 21, mPCWP 15, AO sat 97, PA sat 69, CO 5.8, CI 2.9; d.) TTE 01/16/2020: EF 60-65%, mild LVH, mild-mod AoV scler, asc Ao 39 mm, G1DD   History of CVA (cerebrovascular accident) 10/2019   Hyperlipidemia associated with type 2 diabetes mellitus (HCC)    Hypertension    Hypoglycemia unawareness associated with type 2 diabetes mellitus (HCC)    Osteoarthritis    Paralysis of right vocal cord 10/2019   a.) unknown etiology; ENT consulted--> DDx included idiopathic cause, such as a viral neuropathy, or a subhemispheric stroke    Pheochromocytoma of right adrenal gland    a.) s/p RIGHT adrenalectomy   PSVT (paroxysmal supraventricular tachycardia) (HCC) 09/29/2020   a.) Zio 09/29/2020: PSVT x 16 runs --> fastest lasting 4 beats (max rate 174 bpm) and longest lasting 8 beats (max rate 93 bpm)   Sensorineural hearing loss (SNHL), bilateral    a.) wears BILATERAL hearing aids; b.) s/p LEFT cochlear implant   Stage 3b chronic kidney disease (CKD) (HCC)    Stress-induced cardiomyopathy 10/08/2019   a.) TTE 10/08/2019: EF 25-30%; b.) TEE 10/06/2019: EF 25-30%; c.) R/LHC 11/28/2019: EF >65%; d.) TTE 01/16/2020: EF 60-65%   Temporomandibular joint osteoarthritis (LEFT)    Type II diabetes mellitus with nephropathy (HCC)    Wears hearing aid    bilateral    Past Surgical History:  Procedure Laterality Date   ADRENALECTOMY Right    CATARACT EXTRACTION W/PHACO Left 10/08/2015   Procedure: CATARACT EXTRACTION PHACO AND INTRAOCULAR LENS PLACEMENT (IOC) left eye;  Surgeon: Dene Etienne, MD;  Location: [facility];  Service: Ophthalmology;  Laterality: Left;  MALYUGIN   COCHLEAR IMPLANT Left 06/27/2018   CYSTOSCOPY WITH INSERTION OF UROLIFT N/A 02/15/2023   Procedure: CYSTOSCOPY WITH INSERTION OF UROLIFT;  Surgeon: Twylla Glendia BROCKS, MD;  Location: [facility];  Service: Urology;  Laterality: N/A;   HERNIA REPAIR     RIGHT/LEFT HEART CATH AND CORONARY ANGIOGRAPHY Left 11/28/2019   Procedure: RIGHT/LEFT HEART CATH AND CORONARY ANGIOGRAPHY;  Surgeon: Mady Bruckner, MD;  Location: [facility];  Service: Cardiovascular;  Laterality: Left;   SQUAMOUS CELL CARCINOMA EXCISION  03/2006   left ear   TEE WITHOUT CARDIOVERSION N/A 10/12/2019   Procedure: TRANSESOPHAGEAL ECHOCARDIOGRAM (TEE);  Surgeon: Perla Evalene PARAS, MD;  Location: [facility];  Service: Cardiovascular;  Laterality: N/A;   TONSILLECTOMY     XI ROBOTIC LAPAROSCOPIC ASSISTED APPENDECTOMY N/A 10/11/2019   Procedure: XI ROBOTIC ASSISTED LAPAROSCOPIC  INSERTION GASTROSTOMY TUBE;  Surgeon: Jordis Laneta FALCON, MD;  Location: [facility];  Service: General;  Laterality: N/A;    Family History  Problem Relation Age of Onset   Alcohol abuse Mother    Coronary artery disease Mother    Brain cancer Father        tumor   Diabetes Brother     Social History   Socioeconomic History   Marital status: Married    Spouse name: Joann   Number of children: Not on file   Years of education: Not on file   Highest education level: Doctorate  Occupational History   Occupation: chaplain  Tobacco Use   Smoking status: Never    Passive exposure: Never   Smokeless tobacco: Never  Vaping Use   Vaping status: Never  Used  Substance and Sexual Activity   Alcohol use: Not Currently    Alcohol/week: 1.0 standard drink of alcohol    Types: 1 Glasses of wine per week    Comment: 1 glass per week   Drug use: No   Sexual activity: Not Currently  Other Topics Concern   Not on file  Social History Narrative   Has living will,   HCPOA (wife)--daughter is back pain   Would accept resuscitation   Would consider feeding tube--but no if cognitively unaware   Social Drivers of Health   Financial Resource Strain: Low Risk  (01/08/2023)   Overall Financial Resource Strain (CARDIA)    Difficulty of Paying Living Expenses: Not hard at all  Food Insecurity: No Food Insecurity (01/08/2023)   Hunger Vital Sign    Worried About Running Out of Food in the Last Year: Never true    Ran Out of Food in the Last Year: Never true  Transportation Needs: No Transportation Needs (01/08/2023)   PRAPARE - Administrator, Civil Service (Medical): No    Lack of Transportation (Non-Medical): No  Physical Activity: Unknown (01/08/2023)   Exercise Vital Sign    Days of Exercise per Week: 0 days    Minutes of Exercise per Session: Not on file  Stress: No Stress Concern Present (01/08/2023)   [employer] of Occupational Health - Occupational Stress Questionnaire     Feeling of Stress : Not at all  Social Connections: Socially Integrated (01/08/2023)   Social Connection and Isolation Panel [NHANES]    Frequency of Communication with Friends and Family: More than three times a week    Frequency of Social Gatherings with Friends and Family: Once a week    Attends Religious Services: More than 4 times per year    Active Member of [employer] or Organizations: Yes    Attends Engineer, Structural: More than 4 times per year    Marital Status: Married  Catering Manager Violence: Not At Risk (06/09/2021)   Humiliation, Afraid, Rape, and Kick questionnaire    Fear of Current or Ex-Partner: No    Emotionally Abused: No    Physically Abused: No    Sexually Abused: No   Review of Systems Sleeping fine Appetite is good Weight is up 10#--he didn't realize    Objective:   Physical Exam Constitutional:      Appearance: Normal appearance.  Cardiovascular:     Rate and Rhythm: Normal rate and regular rhythm.     Pulses: Normal pulses.     Heart sounds:     No gallop.     Comments: Loud systolic murmur Pulmonary:     Effort: Pulmonary effort is normal.     Breath sounds: Normal breath sounds. No wheezing or rales.  Musculoskeletal:     Cervical back: Neck supple.  Lymphadenopathy:     Cervical: No cervical adenopathy.  Skin:    Comments: No foot lesions  Neurological:     Mental Status: He is alert.            Assessment & Plan:

## 2023-08-03 ENCOUNTER — Telehealth: Payer: Self-pay | Admitting: Internal Medicine

## 2023-08-03 NOTE — Telephone Encounter (Signed)
 Patient dropped off document  Medical Certification , to be filled out by provider. Patient requested to send it back via Fax within 5-days. Document is located in providers tray at front office.Please advise at [facility] [phone]

## 2023-08-05 NOTE — Telephone Encounter (Signed)
 Left message on VM for Dustin Harrison at [facility] to fax me the complete form.

## 2023-08-17 DIAGNOSIS — D2272 Melanocytic nevi of left lower limb, including hip: Secondary | ICD-10-CM | POA: Diagnosis not present

## 2023-08-17 DIAGNOSIS — Z85828 Personal history of other malignant neoplasm of skin: Secondary | ICD-10-CM | POA: Diagnosis not present

## 2023-08-17 DIAGNOSIS — D0439 Carcinoma in situ of skin of other parts of face: Secondary | ICD-10-CM | POA: Diagnosis not present

## 2023-08-17 DIAGNOSIS — D2261 Melanocytic nevi of right upper limb, including shoulder: Secondary | ICD-10-CM | POA: Diagnosis not present

## 2023-08-17 DIAGNOSIS — D485 Neoplasm of uncertain behavior of skin: Secondary | ICD-10-CM | POA: Diagnosis not present

## 2023-08-17 DIAGNOSIS — D2262 Melanocytic nevi of left upper limb, including shoulder: Secondary | ICD-10-CM | POA: Diagnosis not present

## 2023-08-17 DIAGNOSIS — L570 Actinic keratosis: Secondary | ICD-10-CM | POA: Diagnosis not present

## 2023-08-17 DIAGNOSIS — D225 Melanocytic nevi of trunk: Secondary | ICD-10-CM | POA: Diagnosis not present

## 2023-08-17 DIAGNOSIS — L821 Other seborrheic keratosis: Secondary | ICD-10-CM | POA: Diagnosis not present

## 2023-09-26 ENCOUNTER — Other Ambulatory Visit: Payer: Self-pay | Admitting: Cardiology

## 2023-09-26 ENCOUNTER — Other Ambulatory Visit: Payer: Self-pay | Admitting: Cardiovascular Disease

## 2023-09-26 ENCOUNTER — Other Ambulatory Visit: Payer: Self-pay | Admitting: Internal Medicine

## 2023-09-27 ENCOUNTER — Other Ambulatory Visit: Payer: Self-pay | Admitting: Internal Medicine

## 2023-09-28 ENCOUNTER — Telehealth: Payer: Self-pay | Admitting: Internal Medicine

## 2023-09-28 NOTE — Telephone Encounter (Unsigned)
 Copied from CRM [id number]. Topic: Clinical - Prescription Issue >> Sep 28, 2023  8:56 AM [employer] wrote: Reason for CRM: Patient is unable to get Jardiance  at pharmacy and [facility] stated patient has been taken off. Daughter, Dana Duncan states he was taken off Metformin  June 2024 but remained on Jardiance  10mg . Patient needs med sent to preferred pharmacy.  Callback [phone]  Preferred Pharmacy: [facility] - [place], [place] - [address] [place] [address] Phone: [phone] Fax: [phone] Hours: Not open 24 hours

## 2023-09-28 NOTE — Telephone Encounter (Signed)
 Spoke to pt's daughter. She said he has been on Jardiance  10mg  since last year. Said he never stopped taking it. Says he must have forgotten he was on it at his OV in February.

## 2023-09-28 NOTE — Telephone Encounter (Signed)
 His OV Note from 07-15-23 states he not on any medications for his diabetes.   Will follow-up with Dr Joelle Musca.

## 2023-09-29 MED ORDER — EMPAGLIFLOZIN 10 MG PO TABS: 10.0000 mg | ORAL_TABLET | Freq: Every day | ORAL | 3 refills | Status: AC

## 2023-09-29 NOTE — Telephone Encounter (Signed)
 Rx sent electronically.

## 2023-10-21 ENCOUNTER — Encounter: Admitting: Student

## 2023-10-24 ENCOUNTER — Other Ambulatory Visit: Payer: Self-pay | Admitting: Cardiovascular Disease

## 2023-10-25 NOTE — Telephone Encounter (Signed)
Pt scheduled on 6/5

## 2023-10-25 NOTE — Telephone Encounter (Signed)
 Please contact patient to schedule an overdue appointment for future refills. Thanks

## 2023-10-28 ENCOUNTER — Encounter: Payer: Self-pay | Admitting: Student

## 2023-10-28 ENCOUNTER — Telehealth: Payer: Self-pay | Admitting: *Deleted

## 2023-10-28 ENCOUNTER — Ambulatory Visit: Admitting: Student

## 2023-10-28 VITALS — BP 126/66 | HR 71 | Temp 97.5°F | Ht 71.0 in | Wt 185.0 lb

## 2023-10-28 DIAGNOSIS — E785 Hyperlipidemia, unspecified: Secondary | ICD-10-CM

## 2023-10-28 DIAGNOSIS — N1832 Chronic kidney disease, stage 3b: Secondary | ICD-10-CM | POA: Diagnosis not present

## 2023-10-28 DIAGNOSIS — E1121 Type 2 diabetes mellitus with diabetic nephropathy: Secondary | ICD-10-CM

## 2023-10-28 DIAGNOSIS — I5020 Unspecified systolic (congestive) heart failure: Secondary | ICD-10-CM | POA: Diagnosis not present

## 2023-10-28 DIAGNOSIS — N401 Enlarged prostate with lower urinary tract symptoms: Secondary | ICD-10-CM | POA: Diagnosis not present

## 2023-10-28 DIAGNOSIS — Z86018 Personal history of other benign neoplasm: Secondary | ICD-10-CM | POA: Diagnosis not present

## 2023-10-28 DIAGNOSIS — N529 Male erectile dysfunction, unspecified: Secondary | ICD-10-CM

## 2023-10-28 DIAGNOSIS — Z8673 Personal history of transient ischemic attack (TIA), and cerebral infarction without residual deficits: Secondary | ICD-10-CM | POA: Diagnosis not present

## 2023-10-28 DIAGNOSIS — J3800 Paralysis of vocal cords and larynx, unspecified: Secondary | ICD-10-CM

## 2023-10-28 DIAGNOSIS — Z8719 Personal history of other diseases of the digestive system: Secondary | ICD-10-CM | POA: Diagnosis not present

## 2023-10-28 DIAGNOSIS — H6121 Impacted cerumen, right ear: Secondary | ICD-10-CM

## 2023-10-28 DIAGNOSIS — H409 Unspecified glaucoma: Secondary | ICD-10-CM

## 2023-10-28 DIAGNOSIS — E1169 Type 2 diabetes mellitus with other specified complication: Secondary | ICD-10-CM | POA: Diagnosis not present

## 2023-10-28 MED ORDER — LATANOPROST 0.005 % OP SOLN
1.0000 [drp] | Freq: Every day | OPHTHALMIC | 3 refills | Status: AC
Start: 2023-10-28 — End: ?

## 2023-10-28 MED ORDER — TADALAFIL 5 MG PO TABS: 2.5000 mg | ORAL_TABLET | Freq: Every day | ORAL | 3 refills | Status: DC

## 2023-10-28 MED ORDER — FINASTERIDE 1 MG PO TABS: 1.0000 mg | ORAL_TABLET | Freq: Every day | ORAL | 3 refills | Status: AC

## 2023-10-28 MED ORDER — DEBROX 6.5 % OT SOLN
5.0000 [drp] | Freq: Two times a day (BID) | OTIC | 0 refills | Status: AC
Start: 2023-10-28 — End: 2023-11-02

## 2023-10-28 NOTE — Progress Notes (Signed)
 [facility]: [facility] Provider: Jann Melody  Code Status: Full Code Goals of Care:     10/28/2023   11:13 AM  Advanced Directives  Does Patient Have a Medical Advance Directive? Yes  Type of Estate agent of [place];Living will  Does patient want to make changes to medical advance directive? No - Patient declined  Copy of Healthcare Power of Attorney in Chart? No - copy requested     Chief Complaint  Patient presents with   Establish Care    NP to Establish Care.     HPI: Patient is a 89 y.o. male seen today for medical management of chronic diseases.   Discussed the use of AI scribe software for clinical note transcription with the patient, who gave verbal consent to proceed.  History of Present Illness   Dustin Harrison "Jonell Neptune" is an 89 year old male who presents for a primary care visit. He is accompanied by his wife.  He is transitioning to a new primary care provider due to his previous doctor's retirement and is in the process of moving to [place]. He has no current health concerns and feels well.  He has a history of heart failure with a previous echocardiogram four years ago showing an ejection fraction of 25-35% during an exacerbation in May 2021. After starting medication, his ejection fraction improved to 65%. He has not seen a cardiologist in five years and has no symptoms such as shortness of breath or leg swelling. He weighs himself daily but does not pay much attention to the results.  He experienced vocal cord paralysis following a stroke in 2022, which was identified after he fainted in the bathroom. He has no deficits in strength or verbalization following the stroke.  He has diabetes, with a recent A1c of 7.1, and is on Jardiance . He also has high cholesterol, managed with atorvastatin  and Zetia .  He has benign prostatic hyperplasia and wakes up at night to urinate, typically twice, though sometimes up to four times.  He saw a urologist three months ago, who performed a cystoscopy with normal findings. He is on finasteride  and Flomax  for this condition.  He has a history of pheochromocytoma, which was treated with adrenal gland removal. He also has a history of a duodenal ulcer from 2012, for which he avoids NSAIDs and takes Protonix .  He has hearing loss and uses hearing aids. He has no issues with his memory, though he acknowledges being almost 89 years old. He stopped playing golf five years ago due to hearing difficulties in the golf cart.  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 He retired in 1990 and moved to [place] 18 years ago. He enjoys reading and walking daily.         Past Medical History:  Diagnosis Date   Aortic atherosclerosis (HCC)    Aortic stenosis 10/12/2019   a.) TEE 10/12/2019: visually mod AS (peak-peak gradient ~ 20 mmHg); b.) TTE 01/16/2020: mild-mod sclerosis with no stenosis (MPG 6; VTI = 2.47)   Arthritis    Ascending aorta dilatation (HCC) 01/16/2020   a.) TTE 01/16/2020: asc Ao = 39 mm   Bacteremia due to group B Streptococcus 10/07/2019   Baker's cyst of knee, left    BPH (benign prostatic hypertrophy)    CAD (coronary artery disease) 11/28/2019   a.) R/LHC 11/28/2019: 90% oRI (R-L collaterals), 50% o-pLAD - med mgmt  Cerebral microvascular disease    Chronic back pain    Chronic cough    a.) secondary to "tight" esophagus   Cochlear implant in place (LEFT) 06/27/2018   DDD (degenerative disc disease), cervical    DDD (degenerative disc disease), lumbar    DDD (degenerative disc disease), thoracic    Duodenal ulcer    Erectile dysfunction    a.) on PDE5i (tadalafil )   Glaucoma    HFrEF (heart failure with reduced ejection fraction) (HCC)    a.) TTE 10/08/2019: EF 25-30%, mod LVH, sev mid-apical/antlat/apical/infsep AK, mild LA dil, mild-mod AoV scler, RVSP 40 ; b.) TEE 10/06/2019: EF 25-30% glob HK, sev  AoV scler with visually mod AS (peak-peak grad ~20 mmHg); c.) R/LHC 11/28/2019: EF >65%. mRA 8, mPA 21, mPCWP 15, AO sat 97, PA sat 69, CO 5.8, CI 2.9; d.) TTE 01/16/2020: EF 60-65%, mild LVH, mild-mod AoV scler, asc Ao 39 mm, G1DD   History of CVA (cerebrovascular accident) 10/2019   Hyperlipidemia associated with type 2 diabetes mellitus (HCC)    Hypertension    Hypoglycemia unawareness associated with type 2 diabetes mellitus (HCC)    Osteoarthritis    Paralysis of right vocal cord 10/2019   a.) unknown etiology; ENT consulted--> DDx included idiopathic cause, such as a viral neuropathy, or a subhemispheric stroke   Pheochromocytoma of right adrenal gland    a.) s/p RIGHT adrenalectomy   PSVT (paroxysmal supraventricular tachycardia) (HCC) 09/29/2020   a.) Zio 09/29/2020: PSVT x 16 runs --> fastest lasting 4 beats (max rate 174 bpm) and longest lasting 8 beats (max rate 93 bpm)   Sensorineural hearing loss (SNHL), bilateral    a.) wears BILATERAL hearing aids; b.) s/p LEFT cochlear implant   Stage 3b chronic kidney disease (CKD) (HCC)    Stress-induced cardiomyopathy 10/08/2019   a.) TTE 10/08/2019: EF 25-30%; b.) TEE 10/06/2019: EF 25-30%; c.) R/LHC 11/28/2019: EF >65%; d.) TTE 01/16/2020: EF 60-65%   Temporomandibular joint osteoarthritis (LEFT)    Type II diabetes mellitus with nephropathy (HCC)    Wears hearing aid    bilateral    Past Surgical History:  Procedure Laterality Date   ADRENALECTOMY Right    CATARACT EXTRACTION W/PHACO Left 10/08/2015   Procedure: CATARACT EXTRACTION PHACO AND INTRAOCULAR LENS PLACEMENT (IOC) left eye;  Surgeon: Annell Kidney, MD;  Location: [facility];  Service: Ophthalmology;  Laterality: Left;  MALYUGIN   COCHLEAR IMPLANT Left 06/27/2018   CYSTOSCOPY WITH INSERTION OF UROLIFT N/A 02/15/2023   Procedure: CYSTOSCOPY WITH INSERTION OF UROLIFT;  Surgeon: Geraline Knapp, MD;  Location: [facility];  Service: Urology;  Laterality: N/A;    HERNIA REPAIR     RIGHT/LEFT HEART CATH AND CORONARY ANGIOGRAPHY Left 11/28/2019   Procedure: RIGHT/LEFT HEART CATH AND CORONARY ANGIOGRAPHY;  Surgeon: Sammy Crisp, MD;  Location: [facility];  Service: Cardiovascular;  Laterality: Left;   SQUAMOUS CELL CARCINOMA EXCISION  03/2006   left ear   TEE WITHOUT CARDIOVERSION N/A 10/12/2019   Procedure: TRANSESOPHAGEAL ECHOCARDIOGRAM (TEE);  Surgeon: Devorah Fonder, MD;  Location: [facility];  Service: Cardiovascular;  Laterality: N/A;   TONSILLECTOMY     XI ROBOTIC LAPAROSCOPIC ASSISTED APPENDECTOMY N/A 10/11/2019   Procedure: XI ROBOTIC ASSISTED LAPAROSCOPIC INSERTION GASTROSTOMY TUBE;  Surgeon: Alben Alma, MD;  Location: [facility];  Service: General;  Laterality: N/A;    No Known Allergies  Outpatient Encounter Medications as of 10/28/2023  Medication Sig   amLODipine  (NORVASC ) 5 MG tablet TAKE  ONE (1) TABLET BY MOUTH TWO TIMES PER DAY   atorvastatin  (LIPITOR) 20 MG tablet TAKE ONE TABLET BY MOUTH EVERY DAY   benzonatate  (TESSALON ) 200 MG capsule Take 1 capsule (200 mg total) by mouth 3 (three) times daily as needed for cough.   carbamide peroxide (DEBROX) 6.5 % OTIC solution Place 5 drops into the right ear 2 (two) times daily for 5 days.   dorzolamide-timolol  (COSOPT) 22.3-6.8 MG/ML ophthalmic solution Place 1 drop into both eyes 2 (two) times daily.   empagliflozin  (JARDIANCE ) 10 MG TABS tablet Take 1 tablet (10 mg total) by mouth daily before breakfast.   ezetimibe  (ZETIA ) 10 MG tablet TAKE ONE TABLET BY MOUTH EVERY DAY   glucose blood (ONETOUCH ULTRA) test strip Use to check blood sugar once daily.   Lancets MISC 1 each by Does not apply route daily at 8 pm.   losartan  (COZAAR ) 100 MG tablet TAKE ONE TABLET BY MOUTH EVERY DAY   metoprolol  succinate (TOPROL -XL) 25 MG 24 hr tablet Take 1 tablet (25 mg total) by mouth daily.   pantoprazole  (PROTONIX ) 20 MG tablet TAKE 1 TABLET BY MOUTH DAILY   polyethylene glycol (MIRALAX  /  GLYCOLAX ) 17 g packet Take 17 g by mouth daily.   tamsulosin  (FLOMAX ) 0.4 MG CAPS capsule TAKE 2 CAPSULES EVERY DAY   [DISCONTINUED] finasteride  (PROPECIA ) 1 MG tablet TAKE 1 TABLET BY MOUTH DAILY   [DISCONTINUED] latanoprost (XALATAN) 0.005 % ophthalmic solution Place 1 drop into both eyes at bedtime.   [DISCONTINUED] tadalafil  (CIALIS ) 5 MG tablet Take 0.5-1 tablets (2.5-5 mg total) by mouth daily.   finasteride  (PROPECIA ) 1 MG tablet Take 1 tablet (1 mg total) by mouth daily.   latanoprost (XALATAN) 0.005 % ophthalmic solution Place 1 drop into both eyes at bedtime.   tadalafil  (CIALIS ) 5 MG tablet Take 0.5-1 tablets (2.5-5 mg total) by mouth daily.   No facility-administered encounter medications on file as of 10/28/2023.    Review of Systems:  Review of Systems  Health Maintenance  Topic Date Due   COVID-19 Vaccine (7 - 2024-25 season) 03/29/2023   DTaP/Tdap/Td (3 - Td or Tdap) 09/06/2023   INFLUENZA VACCINE  01/06/2024   FOOT EXAM  01/10/2024   Medicare Annual Wellness (AWV)  01/10/2024   HEMOGLOBIN A1C  01/12/2024   OPHTHALMOLOGY EXAM  04/27/2024   Pneumonia Vaccine 32+ Years old  Completed   Zoster Vaccines- Shingrix  Completed   HPV VACCINES  Aged Out   Meningococcal B Vaccine  Aged Out    Physical Exam: Vitals:   10/28/23 1110  BP: 126/66  Pulse: 71  Temp: (!) 97.5 F (36.4 C)  SpO2: 97%  Weight: 185 lb (83.9 kg)  Height: 5\' 11"  (1.803 m)   Body mass index is 25.8 kg/m. Physical Exam Constitutional:      Appearance: Normal appearance.  Cardiovascular:     Rate and Rhythm: Normal rate and regular rhythm.     Pulses: Normal pulses.     Heart sounds: Murmur heard.  Pulmonary:     Effort: Pulmonary effort is normal.  Abdominal:     General: Abdomen is flat. Bowel sounds are normal.     Palpations: Abdomen is soft.  Musculoskeletal:        General: No swelling or tenderness.  Skin:    General: Skin is dry.     Capillary Refill: Capillary refill takes 2  to 3 seconds.  Neurological:     Mental Status: He is alert and oriented  to person, place, and time.     Gait: Gait normal.  Psychiatric:        Mood and Affect: Mood normal.    Physical Exam   HEENT: Right ear clear, left ear with cerumen impaction. EXTREMITIES: Feet cold to touch. Pulses palpable. Toenails thick with calluses. NEUROLOGICAL: Sensation intact in feet.      Labs reviewed: Basic Metabolic Panel: Recent Labs    11/21/22 1432 01/10/23 1058 02/15/23 1229  NA 138 140 140  K 3.5 5.4* 4.9  CL 104 100 104  CO2 23 29  --   GLUCOSE 47* 147* 114*  BUN 46* 39* 28*  CREATININE 1.26* 1.51* 1.30*  CALCIUM  9.3 10.6*  --   MG 2.4  --   --   PHOS  --  5.4*  --    Liver Function Tests: Recent Labs    11/21/22 1432 01/10/23 1058  AST 33  --   ALT 28  --   ALKPHOS 59  --   BILITOT 0.9  --   PROT 7.3  --   ALBUMIN  4.2 4.8   No results for input(s): "LIPASE", "AMYLASE" in the last 8760 hours. No results for input(s): "AMMONIA" in the last 8760 hours. CBC: Recent Labs    11/21/22 1432 01/10/23 1058 02/15/23 1229  WBC 8.4 9.0  --   NEUTROABS 6.8  --   --   HGB 13.5 14.3 15.6  HCT 41.5 43.9 46.0  MCV 95.4 96.5  --   PLT 302 279.0  --    Lipid Panel: Recent Labs    01/10/23 1058  CHOL 183  HDL 108.10  LDLCALC 62  TRIG 62.0  CHOLHDL 2   Lab Results  Component Value Date   HGBA1C 7.1 (A) 07/15/2023    Procedures since last visit: No results found. Results   LABS A1c: 7.1%  DIAGNOSTIC Echocardiogram: Ejection fraction 25-35% (10/2019) Echocardiogram: Ejection fraction 65% Endoscopy: Duodenal ulcer (2012) Cystoscopy: No stricture, moderate lateral lobe enlargement, normal bladder neck, no median lobe, bilateral ureteral orifices identified, no ulcers, tumors, lesions, or bladder stones (01/2023)      Assessment/Plan    Heart failure Chronic heart failure with a history of reduced ejection fraction. Last echocardiogram four years ago showed  improvement from 25-35% to 65% with medication. No current symptoms of dyspnea or peripheral edema. No cardiology follow-up in five years. - Order echocardiogram to assess current cardiac function.  Diabetes mellitus Type 2 diabetes mellitus, well-controlled with recent A1c of 7.1%.  Benign prostatic hyperplasia Chronic benign prostatic hyperplasia with nocturia 2-4 times per night. Recent cystoscopy showed no strictures or significant abnormalities. On finasteride  and tamsulosin . - Advise reducing fluid intake within three hours of bedtime to manage nocturia.  Hearing loss Chronic hearing loss, using hearing aids. Significant cerumen accumulation in one ear. - Recommend ear drops to soften cerumen before removal.  Hyperlipidemia Chronic hyperlipidemia, managed with atorvastatin  and ezetimibe .  Glaucoma Chronic glaucoma.  Vocal cord paralysis Vocal cord paralysis post-stroke in 2022. No current issues reported.  History of stroke Stroke in 2022 with no residual deficits. Initial symptom was syncope.  History of pheochromocytoma Pheochromocytoma with surgical removal of the right adrenal gland. No current issues reported.  History of duodenal ulcer Duodenal ulcer diagnosed in 2012. On pantoprazole  to prevent recurrence. Advised to avoid NSAIDs.  Follow-up - Schedule follow-up appointment on November 14, 2023, at assisted living facility.       Labs/tests ordered:  * No order type specified *  Next appt:  10/28/2023  I spent greater than 60 minutes for the care of this patient in face to face time, chart review, clinical documentation, patient education.

## 2023-10-28 NOTE — Patient Instructions (Signed)
 VISIT SUMMARY:  You had a primary care visit today to establish care with a new provider. You are feeling well and have no current health concerns. We reviewed your medical history, including heart failure, diabetes, benign prostatic hyperplasia, hearing loss, high cholesterol, glaucoma, vocal cord paralysis, stroke, pheochromocytoma, and a history of duodenal ulcer.  YOUR PLAN:  -HEART FAILURE: Heart failure is a condition where the heart doesn't pump blood as well as it should. Your last echocardiogram showed improvement in your heart function, but we will order a new echocardiogram to assess your current cardiac function.  -DIABETES MELLITUS: Diabetes is a condition that affects how your body processes blood sugar. Your diabetes is well-controlled with a recent A1c of 7.1%.  -BENIGN PROSTATIC HYPERPLASIA: Benign prostatic hyperplasia is an enlarged prostate gland that can cause urinary issues. You wake up at night to urinate 2-4 times. We recommend reducing fluid intake within three hours of bedtime to help manage this.  -HEARING LOSS: Hearing loss is a reduction in your ability to hear. You use hearing aids, and we noticed significant earwax buildup in one ear. We recommend using ear drops to soften the earwax before removal.  -HYPERLIPIDEMIA: Hyperlipidemia is having high levels of cholesterol in your blood. It is managed with atorvastatin  and ezetimibe .  -GLAUCOMA: Glaucoma is a condition that damages the eye's optic nerve and can lead to vision loss. You are managing this condition.  -VOCAL CORD PARALYSIS: Vocal cord paralysis is when one or both vocal cords don't open or close properly. This occurred after your stroke in 2022, but you have no current issues.  -HISTORY OF STROKE: A stroke occurs when the blood supply to part of your brain is interrupted. You had a stroke in 2022 with no lasting effects.  -HISTORY OF PHEOCHROMOCYTOMA: Pheochromocytoma is a rare tumor of the adrenal gland.  You had surgery to remove the tumor and have no current issues.  -HISTORY OF DUODENAL ULCER: A duodenal ulcer is a sore that forms in the lining of the beginning of the small intestine. You take pantoprazole  to prevent recurrence and avoid NSAIDs.  INSTRUCTIONS:  Please schedule a follow-up appointment on November 14, 2023, at your assisted living facility. We will also order an echocardiogram to assess your current heart function.

## 2023-10-28 NOTE — Telephone Encounter (Signed)
 Received Prior Authorization for patient's Tadalafil  5mg . From [facility].   Initiated Prior Auth through [employer]: [id number]

## 2023-11-03 ENCOUNTER — Telehealth: Payer: Self-pay

## 2023-11-03 NOTE — Telephone Encounter (Signed)
 Copied from CRM [id number]. Topic: General - Other >> Nov 03, 2023 10:22 AM Howard Macho wrote: Reason for CRM: Stephenie Einstein from [facility] called stating  she sent a fax over for the patient on 5/22 asking if the doctor will fill out a FL2 and standing orders for assisted living for the patient. Debbie also want include therapy orders on the FL2. Stephenie Einstein has not received it back yet  CB [phone]

## 2023-11-03 NOTE — Telephone Encounter (Signed)
 Left message on VM for Debbie advising her that Dr Joelle Musca has been out of the office since the afternoon of 10-27-23. He just returned today. He has the paperwork and needs time to work on them. Will fax them as soon as he gets them to me.

## 2023-11-07 ENCOUNTER — Encounter: Payer: Self-pay | Admitting: Student

## 2023-11-10 ENCOUNTER — Ambulatory Visit: Attending: Cardiology | Admitting: Cardiology

## 2023-11-10 ENCOUNTER — Encounter: Payer: Self-pay | Admitting: Cardiology

## 2023-11-10 VITALS — BP 132/80 | HR 64 | Ht 71.0 in | Wt 182.0 lb

## 2023-11-10 DIAGNOSIS — I420 Dilated cardiomyopathy: Secondary | ICD-10-CM | POA: Diagnosis not present

## 2023-11-10 DIAGNOSIS — E785 Hyperlipidemia, unspecified: Secondary | ICD-10-CM | POA: Diagnosis not present

## 2023-11-10 DIAGNOSIS — I10 Essential (primary) hypertension: Secondary | ICD-10-CM

## 2023-11-10 DIAGNOSIS — Z79899 Other long term (current) drug therapy: Secondary | ICD-10-CM

## 2023-11-10 DIAGNOSIS — I5032 Chronic diastolic (congestive) heart failure: Secondary | ICD-10-CM

## 2023-11-10 DIAGNOSIS — I25118 Atherosclerotic heart disease of native coronary artery with other forms of angina pectoris: Secondary | ICD-10-CM

## 2023-11-10 NOTE — Progress Notes (Signed)
 Cardiology Office Note   Date:  11/10/2023  ID:  Dustin Harrison, DOB 23-Apr-1932, MRN [id number] PCP: Valrie Gehrig, MD  [facility] Providers Cardiologist:  Belva Boyden, MD     History of Present Illness Dustin Harrison is a 89 y.o. male with a past medical history of HFrEF, bradycardia, pheochromocytoma EMEA, right adrenal tumor status post right adrenalectomy, cochlear implant on the left (06/2018), type 2 diabetes, chronic back pain, chronic kidney disease, coronary artery disease, aortic stenosis, and history of syncope, who presents today for follow-up of his coronary artery disease.   Previous TEE in 10/2019 in the setting of positive cultures for bacteremia, source of infection not identified, with an LVEF of 25-30%, G1 DD, significant wall motion abnormalities, and images suggestive of stress-induced cardiomyopathy versus LAD infarct.  TEE with no evidence of vegetation/endocarditis a moderate aortic stenosis not visualized on TTE.  He underwent cardiac catheterization 11/28/2019 with severe single-vessel coronary artery disease 90% ostial stenosis of the small ramus intermedius that fills with right to left collaterals some mild to moderate nonobstructive CAD involving the LAD, left circumflex, RCA, upper normal to mildly elevated L/R heart filling pressures and mild to moderate aortic stenosis.  Repeat echocardiogram done 01/14/2020 revealed an EF normalized with an EF of 60 to 65%, no RWMA, mild LVH, RV with normal size and function, trivial MR, and mild dilatation of the ascending aorta measuring 39 mm.  He was seen in clinic 02/01/2021 at that time he was having no complaints of chest pain, shortness of breath or palpitations.  If heart rate normalizes since starting on beta-blocker therapy.  He did have elevated D-dimer and was scheduled for VQ scan to rule out PE, but resulted negative.  He was again evaluated in clinic 04/2022 same and doing fairly well from a cardiac  perspective.  There were no changes made to his medication regimen and no further testing that was ordered at that time.  He was sent for updated blood work.   He was last seen in clinic 07/19/2022 by Dr. Gollan.  At that time he was doing well from the cardiac perspective.  He had previously been started with a feeding tube due to right vocal cord paralysis and 2.2 cm intraluminal density which will require further GI workup.  There were no medication changes that were made and no further testing that was ordered.  He returns to clinic today accompanied by his wife.  He states overall he has been doing well he still has some occasional shortness of breath primarily with exertion and swelling to his bilateral lower extremities every once in a while.  Denies any chest pain, palpitations, lightheadedness/dizziness or syncopal episodes.  States that he has been compliant with his current medication regimen with any undue side effects.  They have just recently moved so there is a lot of stress at home trying to get everything unpacked and situated.  He denies any hospitalizations or visits to the emergency department.  ROS: 10 point review of system has been reviewed and considered negative except ones been listed in the HPI  Studies Reviewed EKG Interpretation Date/Time:  Thursday November 10 2023 09:19:18 EDT Ventricular Rate:  64 PR Interval:  190 QRS Duration:  98 QT Interval:  416 QTC Calculation: 429 R Axis:   39  Text Interpretation: Normal sinus rhythm Normal ECG When compared with ECG of 21-Nov-2022 14:25, PREVIOUS ECG IS PRESENT Confirmed by Ronald Cockayne ([id number]) on 11/10/2023 9:23:58 AM  2D echo 01/16/2020 1. Left ventricular ejection fraction, by estimation, is 60 to 65%. The  left ventricle has normal function. The left ventricle has no regional  wall motion abnormalities. There is mild left ventricular hypertrophy.  Left ventricular diastolic parameters  are consistent with age-related  delayed relaxation (normal).   2. Right ventricular systolic function is normal. The right ventricular  size is normal. There is normal pulmonary artery systolic pressure.   3. The mitral valve is normal in structure. Trivial mitral valve  regurgitation.   4. The aortic valve is tricuspid. Aortic valve regurgitation is not  visualized. Mild to moderate aortic valve sclerosis/calcification is  present, without any evidence of aortic stenosis.   5. Aortic dilatation noted. There is mild dilatation of the ascending  aorta measuring 39 mm.   6. The inferior vena cava is normal in size with greater than 50%  respiratory variability, suggesting right atrial pressure of 3 mmHg.   R/LHC 11/28/19   Severe single-vessel coronary artery disease with 90% ostial stenosis involving small ramus intermedius branch that also fills via right-to-left collaterals. Mild to moderate, non-obstructive coronary artery disease involving the LAD, LCx, and RCA. Upper normal to mildly elevated left and right heart filling pressures. Hyperdynamic left ventricular contraction. Mild to moderate aortic stenosis.    TTE 10/08/19  1. Left ventricular ejection fraction, by estimation, is 25 to 30%. The  left ventricle has severely decreased function. The left ventricle has no  regional wall motion abnormalities. There is moderate left ventricular  hypertrophy. Left ventricular  diastolic parameters are consistent with Grade I diastolic dysfunction  (impaired relaxation). There is severe akinesis of the left ventricular,  mid-apical anteroseptal wall, anterolateral wall, anterior segment, apical  segment and inferoseptal wall. Wall   motion abnormalities are suggestive of stress induced cardiomyopathy vs.  LAD infarct.   2. Right ventricular systolic function is normal. The right ventricular  size is normal. There is mildly elevated pulmonary artery systolic  pressure.   3. Left atrial size was mildly dilated.   4. The  mitral valve is normal in structure. No evidence of mitral valve  regurgitation. No evidence of mitral stenosis.   5. The aortic valve is normal in structure. Aortic valve regurgitation is  not visualized. Mild to moderate aortic valve sclerosis/calcification is  present, without any evidence of aortic stenosis.   6. The inferior vena cava is normal in size with greater than 50%  respiratory variability, suggesting right atrial pressure of 3 mmHg.   7. No clear vegetations but suboptimal study.    TEE 10/12/19  1. Left ventricular ejection fraction, by estimation, is 25 to 30%. The  left ventricle has severely decreased function. The left ventricle  demonstrates global hypokinesis.   2. Right ventricular systolic function is normal. The right ventricular  size is normal. Tricuspid regurgitation signal is inadequate for assessing  PA pressure.   3. The mitral valve is normal in structure. No evidence of mitral valve  regurgitation.   4. Aortic valve regurgitation is not visualized. Severe calcification of  aortic valve. Visually, there appears to be moderate aortic valve  stenosis.   5. Grossly, no valve vegetation noted.    Risk Assessment/Calculations           Physical Exam VS:  BP 132/80 (BP Location: Left Arm)   Pulse 64   Ht 5\' 11"  (1.803 m)   Wt 182 lb (82.6 kg)   SpO2 98%   BMI 25.38 kg/m  Wt Readings from Last 3 Encounters:  11/10/23 182 lb (82.6 kg)  10/28/23 185 lb (83.9 kg)  07/15/23 185 lb (83.9 kg)    GEN: Well nourished, well developed in no acute distress NECK: No JVD; No carotid bruits CARDIAC: RRR, no murmurs, rubs, gallops RESPIRATORY:  Clear to auscultation without rales, wheezing or rhonchi  ABDOMEN: Soft, non-tender, non-distended EXTREMITIES: Trace pretibial edema; No deformity   ASSESSMENT AND PLAN Coronary artery disease native coronary arteries with stable angina.  He denies any anginal anginal equivalents today.  Last left and right heart  catheterization completed 11/28/2018 with severe single-vessel CAD with 90% ostial stenosis mild small ramus.  He was recommended for medical treatment.  EKG today reveals sinus rhythm with a rate of 64 with no ischemic changes noted.  He is continued on Lipitor 20 mg daily.  No further ischemic testing needed at this time.  Dilated cardiomyopathy/HFimpEF with a last LVEF of 60-65% on echocardiogram completed in 01/2020.  Previously reduced LVEF of 25-30%.  Guideline-directed medical therapy has been limited due to symptoms of lightheadedness and dizziness as well as hyperkalemia.  He appears to be euvolemic on exam today suffering from NYHA class I-II symptoms on occasion.  He is continued on Toprol -XL 25 mg daily, Jardiance  10 mg daily, losartan  100 mg daily.  He is being sent for updated labs today of CBC, BMP.  He is not a candidate for MRA therapy due to previous hyperkalemia.  He has been continued on current GDMT today.  With continued dyspnea on exertion occasional peripheral edema will update echocardiogram to ensure heart function has remained normalized.  Primary hypertension with a blood pressure today 132/80.  Blood pressure remained stable.  He has been continued on amlodipine  5 mg twice daily, losartan  100 mg daily, Toprol -XL 25 mg daily.  He has been encouraged to continue to monitor his pressures at home 1 to 2 hours postmedication administration as well.  Mixed hyperlipidemia where he has been sent for an updated lipid panel today.  He has been continued on atorvastatin  20 mg daily and ezetimibe  10 mg daily.  Goal is 55 or less.       Dispo: Patient return to clinic to see MD/APP in 6 months or sooner if needed for further evaluation of symptoms.  Signed, Orian Figueira, NP

## 2023-11-10 NOTE — Patient Instructions (Signed)
 Medication Instructions:  Your physician recommends that you continue on your current medications as directed. Please refer to the Current Medication list given to you today.   *If you need a refill on your cardiac medications before your next appointment, please call your pharmacy*  Lab Work: Your provider would like for you to have following labs drawn today CBC BMP Lipid.   If you have labs (blood work) drawn today and your tests are completely normal, you will receive your results only by: MyChart Message (if you have MyChart) OR A paper copy in the mail If you have any lab test that is abnormal or we need to change your treatment, we will call you to review the results.  Testing/Procedures: Your physician has requested that you have an echocardiogram. Echocardiography is a painless test that uses sound waves to create images of your heart. It provides your doctor with information about the size and shape of your heart and how well your heart's chambers and valves are working.   You may receive an ultrasound enhancing agent through an IV if needed to better visualize your heart during the echo. This procedure takes approximately one hour.  There are no restrictions for this procedure.  This will take place at [address] [facility]) [address], [place] [address]  Please note: We ask at that you not bring children with you during ultrasound (echo/ vascular) testing. Due to room size and safety concerns, children are not allowed in the ultrasound rooms during exams. Our front office staff cannot provide observation of children in our lobby area while testing is being conducted. An adult accompanying a patient to their appointment will only be allowed in the ultrasound room at the discretion of the ultrasound technician under special circumstances. We apologize for any inconvenience.   Follow-Up: At [facility], you and your health needs are our priority.  As part of  our continuing mission to provide you with exceptional heart care, our providers are all part of one team.  This team includes your primary Cardiologist (physician) and Advanced Practice Providers or APPs (Physician Assistants and Nurse Practitioners) who all work together to provide you with the care you need, when you need it.  Your next appointment:   6 month(s)  Provider:   Timothy Gollan, MD

## 2023-11-11 ENCOUNTER — Ambulatory Visit: Payer: Self-pay | Admitting: Cardiology

## 2023-11-11 LAB — BASIC METABOLIC PANEL WITH GFR
BUN/Creatinine Ratio: 24 (ref 10–24)
BUN: 37 mg/dL — ABNORMAL HIGH (ref 8–27)
CO2: 23 mmol/L (ref 20–29)
Calcium: 9.6 mg/dL (ref 8.6–10.2)
Chloride: 100 mmol/L (ref 96–106)
Creatinine, Ser: 1.52 mg/dL — ABNORMAL HIGH (ref 0.76–1.27)
Glucose: 160 mg/dL — ABNORMAL HIGH (ref 70–99)
Potassium: 4.6 mmol/L (ref 3.5–5.2)
Sodium: 138 mmol/L (ref 134–144)
eGFR: 44 mL/min/{1.73_m2} — ABNORMAL LOW (ref >59)

## 2023-11-11 LAB — CBC
Hematocrit: 45.9 % (ref 37.5–51.0)
Hemoglobin: 15.1 g/dL (ref 13.0–17.7)
MCH: 31.7 pg (ref 26.6–33.0)
MCHC: 32.9 g/dL (ref 31.5–35.7)
MCV: 96 fL (ref 79–97)
Platelets: 249 10*3/uL (ref 150–450)
RBC: 4.77 x10E6/uL (ref 4.14–5.80)
RDW: 12.4 % (ref 11.6–15.4)
WBC: 8 10*3/uL (ref 3.4–10.8)

## 2023-11-11 LAB — LIPID PANEL
Chol/HDL Ratio: 1.8 ratio (ref 0.0–5.0)
Cholesterol, Total: 176 mg/dL (ref 100–199)
HDL: 97 mg/dL (ref >39)
LDL Chol Calc (NIH): 67 mg/dL (ref 0–99)
Triglycerides: 59 mg/dL (ref 0–149)
VLDL Cholesterol Cal: 12 mg/dL (ref 5–40)

## 2023-11-11 NOTE — Progress Notes (Signed)
 Blood glucose elevated, kidney function stable, blood counts have remained stable.  Cholesterol or LDL is slightly increased.  Decrease fast, fatty, preprocessed foods and increase hydration.  Continue current medication regimen at this time with no changes needed.

## 2023-11-14 ENCOUNTER — Encounter: Payer: Self-pay | Admitting: Student

## 2023-11-15 DIAGNOSIS — R0989 Other specified symptoms and signs involving the circulatory and respiratory systems: Secondary | ICD-10-CM | POA: Diagnosis not present

## 2023-11-15 DIAGNOSIS — R0602 Shortness of breath: Secondary | ICD-10-CM | POA: Diagnosis not present

## 2023-11-17 DIAGNOSIS — I10 Essential (primary) hypertension: Secondary | ICD-10-CM | POA: Diagnosis not present

## 2023-11-17 DIAGNOSIS — E119 Type 2 diabetes mellitus without complications: Secondary | ICD-10-CM | POA: Diagnosis not present

## 2023-11-17 DIAGNOSIS — N183 Chronic kidney disease, stage 3 unspecified: Secondary | ICD-10-CM | POA: Diagnosis not present

## 2023-11-17 DIAGNOSIS — I5020 Unspecified systolic (congestive) heart failure: Secondary | ICD-10-CM | POA: Diagnosis not present

## 2023-11-17 NOTE — Progress Notes (Signed)
 This encounter was created in error - please disregard.

## 2023-11-18 DIAGNOSIS — R278 Other lack of coordination: Secondary | ICD-10-CM | POA: Diagnosis not present

## 2023-11-18 DIAGNOSIS — Z8673 Personal history of transient ischemic attack (TIA), and cerebral infarction without residual deficits: Secondary | ICD-10-CM | POA: Diagnosis not present

## 2023-11-18 DIAGNOSIS — M6281 Muscle weakness (generalized): Secondary | ICD-10-CM | POA: Diagnosis not present

## 2023-11-18 DIAGNOSIS — R079 Chest pain, unspecified: Secondary | ICD-10-CM | POA: Diagnosis not present

## 2023-11-18 DIAGNOSIS — R4189 Other symptoms and signs involving cognitive functions and awareness: Secondary | ICD-10-CM | POA: Diagnosis not present

## 2023-11-18 DIAGNOSIS — E119 Type 2 diabetes mellitus without complications: Secondary | ICD-10-CM | POA: Diagnosis not present

## 2023-11-18 DIAGNOSIS — R498 Other voice and resonance disorders: Secondary | ICD-10-CM | POA: Diagnosis not present

## 2023-11-18 DIAGNOSIS — I5020 Unspecified systolic (congestive) heart failure: Secondary | ICD-10-CM | POA: Diagnosis not present

## 2023-11-18 DIAGNOSIS — R2689 Other abnormalities of gait and mobility: Secondary | ICD-10-CM | POA: Diagnosis not present

## 2023-11-18 DIAGNOSIS — E1122 Type 2 diabetes mellitus with diabetic chronic kidney disease: Secondary | ICD-10-CM | POA: Diagnosis not present

## 2023-11-21 DIAGNOSIS — D0439 Carcinoma in situ of skin of other parts of face: Secondary | ICD-10-CM | POA: Diagnosis not present

## 2023-11-22 ENCOUNTER — Other Ambulatory Visit: Payer: Self-pay | Admitting: Cardiovascular Disease

## 2023-11-23 ENCOUNTER — Ambulatory Visit: Attending: Cardiology

## 2023-11-23 DIAGNOSIS — R4189 Other symptoms and signs involving cognitive functions and awareness: Secondary | ICD-10-CM | POA: Diagnosis not present

## 2023-11-23 DIAGNOSIS — I5032 Chronic diastolic (congestive) heart failure: Secondary | ICD-10-CM

## 2023-11-23 DIAGNOSIS — M6281 Muscle weakness (generalized): Secondary | ICD-10-CM | POA: Diagnosis not present

## 2023-11-23 DIAGNOSIS — E1122 Type 2 diabetes mellitus with diabetic chronic kidney disease: Secondary | ICD-10-CM | POA: Diagnosis not present

## 2023-11-23 DIAGNOSIS — Z8673 Personal history of transient ischemic attack (TIA), and cerebral infarction without residual deficits: Secondary | ICD-10-CM | POA: Diagnosis not present

## 2023-11-23 DIAGNOSIS — E119 Type 2 diabetes mellitus without complications: Secondary | ICD-10-CM | POA: Diagnosis not present

## 2023-11-23 DIAGNOSIS — R278 Other lack of coordination: Secondary | ICD-10-CM | POA: Diagnosis not present

## 2023-11-23 DIAGNOSIS — R498 Other voice and resonance disorders: Secondary | ICD-10-CM | POA: Diagnosis not present

## 2023-11-23 DIAGNOSIS — R2689 Other abnormalities of gait and mobility: Secondary | ICD-10-CM | POA: Diagnosis not present

## 2023-11-23 DIAGNOSIS — I5020 Unspecified systolic (congestive) heart failure: Secondary | ICD-10-CM | POA: Diagnosis not present

## 2023-11-23 LAB — ECHOCARDIOGRAM COMPLETE
AR max vel: 1.48 cm2
AV Area VTI: 1.61 cm2
AV Area mean vel: 1.48 cm2
AV Mean grad: 22 mmHg
AV Peak grad: 36.2 mmHg
Ao pk vel: 3.01 m/s
Area-P 1/2: 5.38 cm2
Calc EF: 65.6 %
S' Lateral: 2.7 cm
Single Plane A2C EF: 72.5 %
Single Plane A4C EF: 60 %

## 2023-11-23 NOTE — Progress Notes (Signed)
 Heart squeeze is noted in 60 to 65% which is normal function, no wall motion abnormalities were noted there is mild thickness of the left ventricle likely from age and/or high blood pressure, there is mild leakage in the mitral valve and mild to moderate leakage in the tricuspid valve.  There is moderate aortic valve stenosis or narrowing of the aortic valve opening.  Recommend annual surveillance studies for continued monitoring.

## 2023-12-01 DIAGNOSIS — R278 Other lack of coordination: Secondary | ICD-10-CM | POA: Diagnosis not present

## 2023-12-01 DIAGNOSIS — Z8673 Personal history of transient ischemic attack (TIA), and cerebral infarction without residual deficits: Secondary | ICD-10-CM | POA: Diagnosis not present

## 2023-12-01 DIAGNOSIS — E119 Type 2 diabetes mellitus without complications: Secondary | ICD-10-CM | POA: Diagnosis not present

## 2023-12-01 DIAGNOSIS — R2689 Other abnormalities of gait and mobility: Secondary | ICD-10-CM | POA: Diagnosis not present

## 2023-12-01 DIAGNOSIS — R4189 Other symptoms and signs involving cognitive functions and awareness: Secondary | ICD-10-CM | POA: Diagnosis not present

## 2023-12-01 DIAGNOSIS — R498 Other voice and resonance disorders: Secondary | ICD-10-CM | POA: Diagnosis not present

## 2023-12-01 DIAGNOSIS — I5020 Unspecified systolic (congestive) heart failure: Secondary | ICD-10-CM | POA: Diagnosis not present

## 2023-12-01 DIAGNOSIS — M6281 Muscle weakness (generalized): Secondary | ICD-10-CM | POA: Diagnosis not present

## 2023-12-01 DIAGNOSIS — E1122 Type 2 diabetes mellitus with diabetic chronic kidney disease: Secondary | ICD-10-CM | POA: Diagnosis not present

## 2024-01-10 DIAGNOSIS — E119 Type 2 diabetes mellitus without complications: Secondary | ICD-10-CM | POA: Diagnosis not present

## 2024-01-10 DIAGNOSIS — M6281 Muscle weakness (generalized): Secondary | ICD-10-CM | POA: Diagnosis not present

## 2024-01-10 DIAGNOSIS — R4189 Other symptoms and signs involving cognitive functions and awareness: Secondary | ICD-10-CM | POA: Diagnosis not present

## 2024-01-10 DIAGNOSIS — Z8673 Personal history of transient ischemic attack (TIA), and cerebral infarction without residual deficits: Secondary | ICD-10-CM | POA: Diagnosis not present

## 2024-01-10 DIAGNOSIS — I5020 Unspecified systolic (congestive) heart failure: Secondary | ICD-10-CM | POA: Diagnosis not present

## 2024-01-10 DIAGNOSIS — R278 Other lack of coordination: Secondary | ICD-10-CM | POA: Diagnosis not present

## 2024-01-11 ENCOUNTER — Encounter: Payer: Medicare PPO | Admitting: Internal Medicine

## 2024-01-30 ENCOUNTER — Non-Acute Institutional Stay: Payer: Self-pay | Admitting: Student

## 2024-01-30 DIAGNOSIS — L259 Unspecified contact dermatitis, unspecified cause: Secondary | ICD-10-CM

## 2024-02-04 ENCOUNTER — Encounter: Payer: Self-pay | Admitting: Student

## 2024-02-04 NOTE — Progress Notes (Signed)
 Location:  Other Nursing Home Room Number: [facility] [address] Place of Service:  ALF [phone]) Provider:  Abdul Abdul, Richerd, MD  Patient Care Team: Abdul Richerd, MD as PCP - General (Family Medicine) Perla Evalene PARAS, MD as PCP - Cardiology (Cardiology) Mittie Gaskin, MD as Referring Physician (Ophthalmology) Dasher, Alm LABOR, MD as Referring Physician (Dermatology)  Extended Emergency Contact Information Primary Emergency Contact: Vicci Grout Address: [address]           [place], [place] [address] [place] Work Phone: [phone] Mobile Phone: [phone] Relation: Spouse Secondary Emergency Contact: Bumgarder,Kay Address: [address]          [place], [place] [address] [place] Mobile Phone: [phone] Relation: Daughter  Code Status:  Full Code Goals of care: Advanced Directive information    10/28/2023   11:13 AM  Advanced Directives  Does Patient Have a Medical Advance Directive? Yes  Type of Estate agent of [place];Living will  Does patient want to make changes to medical advance directive? No - Patient declined  Copy of Healthcare Power of Attorney in Chart? No - copy requested     Chief Complaint  Patient presents with   Medical Management of Chronic Issues    HPI:  Pt is a 89 y.o. male seen today for a routine visit   Discussed the use of AI scribe software for clinical note transcription with the patient, who gave verbal consent to proceed.  History of Present Illness   History of Present Illness He presents with a chronic skin rash on the arm. He is accompanied by his wife.  He has a chronic skin condition that began approximately one month ago, initially presenting as a skin tear. The area has since healed but now exhibits small bumps and a rash-like appearance. The sensation is slightly painful and itchy, particularly when touched. He has not applied Neosporin but has  used nonstick dressings and calcimalginate for a couple of days, followed by a nonstick top cover. The itching is more prominent than the pain, and scratching may contribute to its inflamed appearance. There is no drainage, but the area appears shiny due to inflammation.  He reports a fall that occurred two weeks ago while [place], attributed to knee weakness and heat. He is not currently in physical therapy, although a therapist named Rosina visits occasionally.  No issues with bowel movements or urination, stating he is 'good at pooping' and can 'pee pretty good too.'   Past Medical History:  Diagnosis Date   Aortic atherosclerosis (HCC)    Aortic stenosis 10/12/2019   a.) TEE 10/12/2019: visually mod AS (peak-peak gradient ~ 20 mmHg); b.) TTE 01/16/2020: mild-mod sclerosis with no stenosis (MPG 6; VTI = 2.47)   Arthritis    Ascending aorta dilatation (HCC) 01/16/2020   a.) TTE 01/16/2020: asc Ao = 39 mm   Bacteremia due to group B Streptococcus 10/07/2019   Baker's cyst of knee, left    BPH (benign prostatic hypertrophy)    CAD (coronary artery disease) 11/28/2019   a.) R/LHC 11/28/2019: 90% oRI (R-L collaterals), 50% o-pLAD - med mgmt   Cerebral microvascular disease    Chronic back pain    Chronic cough    a.) secondary to tight esophagus   Cochlear implant in place (LEFT) 06/27/2018   DDD (degenerative disc disease), cervical    DDD (degenerative disc disease), lumbar    DDD (degenerative disc disease), thoracic  Duodenal ulcer    Erectile dysfunction    a.) on PDE5i (tadalafil )   Glaucoma    HFrEF (heart failure with reduced ejection fraction) (HCC)    a.) TTE 10/08/2019: EF 25-30%, mod LVH, sev mid-apical/antlat/apical/infsep AK, mild LA dil, mild-mod AoV scler, RVSP 40 ; b.) TEE 10/06/2019: EF 25-30% glob HK, sev AoV scler with visually mod AS (peak-peak grad ~20 mmHg); c.) R/LHC 11/28/2019: EF >65%. mRA 8, mPA 21, mPCWP 15, AO sat 97, PA sat 69, CO 5.8, CI  2.9; d.) TTE 01/16/2020: EF 60-65%, mild LVH, mild-mod AoV scler, asc Ao 39 mm, G1DD   History of CVA (cerebrovascular accident) 10/2019   Hyperlipidemia associated with type 2 diabetes mellitus (HCC)    Hypertension    Hypoglycemia unawareness associated with type 2 diabetes mellitus (HCC)    Osteoarthritis    Paralysis of right vocal cord 10/2019   a.) unknown etiology; ENT consulted--> DDx included idiopathic cause, such as a viral neuropathy, or a subhemispheric stroke   Pheochromocytoma of right adrenal gland    a.) s/p RIGHT adrenalectomy   PSVT (paroxysmal supraventricular tachycardia) (HCC) 09/29/2020   a.) Zio 09/29/2020: PSVT x 16 runs --> fastest lasting 4 beats (max rate 174 bpm) and longest lasting 8 beats (max rate 93 bpm)   Sensorineural hearing loss (SNHL), bilateral    a.) wears BILATERAL hearing aids; b.) s/p LEFT cochlear implant   Stage 3b chronic kidney disease (CKD) (HCC)    Stress-induced cardiomyopathy 10/08/2019   a.) TTE 10/08/2019: EF 25-30%; b.) TEE 10/06/2019: EF 25-30%; c.) R/LHC 11/28/2019: EF >65%; d.) TTE 01/16/2020: EF 60-65%   Temporomandibular joint osteoarthritis (LEFT)    Type II diabetes mellitus with nephropathy (HCC)    Wears hearing aid    bilateral   Past Surgical History:  Procedure Laterality Date   ADRENALECTOMY Right    CATARACT EXTRACTION W/PHACO Left 10/08/2015   Procedure: CATARACT EXTRACTION PHACO AND INTRAOCULAR LENS PLACEMENT (IOC) left eye;  Surgeon: Dene Etienne, MD;  [facility];  Service: Ophthalmology;  Laterality: Left;  MALYUGIN   COCHLEAR IMPLANT Left 06/27/2018   CYSTOSCOPY WITH INSERTION OF UROLIFT N/A 02/15/2023   Procedure: CYSTOSCOPY WITH INSERTION OF UROLIFT;  Surgeon: Twylla Glendia BROCKS, MD;  Location: [facility];  Service: Urology;  Laterality: N/A;   HERNIA REPAIR     RIGHT/LEFT HEART CATH AND CORONARY ANGIOGRAPHY Left 11/28/2019   Procedure: RIGHT/LEFT HEART CATH AND CORONARY ANGIOGRAPHY;   Surgeon: Mady Bruckner, MD;  Location: [facility];  Service: Cardiovascular;  Laterality: Left;   SQUAMOUS CELL CARCINOMA EXCISION  03/2006   left ear   TEE WITHOUT CARDIOVERSION N/A 10/12/2019   Procedure: TRANSESOPHAGEAL ECHOCARDIOGRAM (TEE);  Surgeon: Perla Evalene PARAS, MD;  Location: [facility];  Service: Cardiovascular;  Laterality: N/A;   TONSILLECTOMY     XI ROBOTIC LAPAROSCOPIC ASSISTED APPENDECTOMY N/A 10/11/2019   Procedure: XI ROBOTIC ASSISTED LAPAROSCOPIC INSERTION GASTROSTOMY TUBE;  Surgeon: Jordis Laneta FALCON, MD;  Location: [facility];  Service: General;  Laterality: N/A;    No Known Allergies  Outpatient Encounter Medications as of 01/30/2024  Medication Sig   amLODipine  (NORVASC ) 5 MG tablet TAKE ONE (1) TABLET BY MOUTH TWO TIMES PER DAY   atorvastatin  (LIPITOR) 20 MG tablet TAKE ONE TABLET BY MOUTH EVERY DAY   benzonatate  (TESSALON ) 200 MG capsule Take 1 capsule (200 mg total) by mouth 3 (three) times daily as needed for cough.   dorzolamide-timolol  (COSOPT) 22.3-6.8 MG/ML ophthalmic solution Place 1 drop  into both eyes 2 (two) times daily.   empagliflozin  (JARDIANCE ) 10 MG TABS tablet Take 1 tablet (10 mg total) by mouth daily before breakfast.   ezetimibe  (ZETIA ) 10 MG tablet TAKE ONE TABLET BY MOUTH EVERY DAY   finasteride  (PROPECIA ) 1 MG tablet Take 1 tablet (1 mg total) by mouth daily.   glucose blood (ONETOUCH ULTRA) test strip Use to check blood sugar once daily.   Lancets MISC 1 each by Does not apply route daily at 8 pm.   latanoprost  (XALATAN ) 0.005 % ophthalmic solution Place 1 drop into both eyes at bedtime.   losartan  (COZAAR ) 100 MG tablet TAKE ONE TABLET BY MOUTH EVERY DAY   metoprolol  succinate (TOPROL -XL) 25 MG 24 hr tablet TAKE 1 TABLET BY MOUTH DAILY   pantoprazole  (PROTONIX ) 20 MG tablet TAKE 1 TABLET BY MOUTH DAILY   polyethylene glycol (MIRALAX  / GLYCOLAX ) 17 g packet Take 17 g by mouth daily.   tadalafil  (CIALIS ) 5 MG tablet Take 0.5-1 tablets  (2.5-5 mg total) by mouth daily.   tamsulosin  (FLOMAX ) 0.4 MG CAPS capsule TAKE 2 CAPSULES EVERY DAY   No facility-administered encounter medications on file as of 01/30/2024.    Review of Systems  Immunization History  Administered Date(s) Administered   INFLUENZA, HIGH DOSE SEASONAL PF 01/25/2015, 02/17/2016, 03/03/2017, 03/06/2018, 02/10/2019, 02/11/2021   Influenza Whole 02/25/2009, 01/30/2010, 02/11/2012   Influenza-Unspecified 01/09/2013, 02/21/2020, 02/01/2023   MMR 12/11/1996   Moderna Covid-19 Vaccine Bivalent Booster 59yrs & up 02/11/2021   Moderna Sars-Covid-2 Vaccination 06/21/2019, 07/19/2019, 04/22/2020, 03/08/2022, 02/01/2023   Pneumococcal Conjugate-13 01/17/2014   Pneumococcal Polysaccharide-23 03/07/2006   RSV,unspecified 03/08/2022   Td 11/08/2006   Tdap 09/05/2013   Zoster Recombinant(Shingrix) 08/27/2016, 11/24/2016   Zoster, Live 12/16/2005   Pertinent  Health Maintenance Due  Topic Date Due   INFLUENZA VACCINE  01/06/2024   FOOT EXAM  01/10/2024   HEMOGLOBIN A1C  01/12/2024   OPHTHALMOLOGY EXAM  04/27/2024      01/17/2022    5:20 PM 09/30/2022    3:12 PM 01/10/2023   10:22 AM 01/10/2023   10:43 AM 10/28/2023   11:13 AM  Fall Risk  Falls in the past year?  0 0 0 0  Was there an injury with Fall?  0 0  0  Fall Risk Category Calculator  0 0  0  (RETIRED) Patient Fall Risk Level Low fall risk       Patient at Risk for Falls Due to  No Fall Risks Impaired mobility  No Fall Risks  Fall risk Follow up  Falls evaluation completed Falls evaluation completed  Falls evaluation completed     Data saved with a previous flowsheet row definition   Functional Status Survey:    There were no vitals filed for this visit. There is no height or weight on file to calculate BMI. Physical Exam  Physical Exam SKIN: Rash with small bumps, shiny from inflammation, no drainage.  Labs reviewed: Recent Labs    02/15/23 1229 11/10/23 0952  NA 140 138  K 4.9 4.6  CL  104 100  CO2  --  23  GLUCOSE 114* 160*  BUN 28* 37*  CREATININE 1.30* 1.52*  CALCIUM   --  9.6   No results for input(s): AST, ALT, ALKPHOS, BILITOT, PROT, ALBUMIN  in the last 8760 hours. Recent Labs    02/15/23 1229 11/10/23 0952  WBC  --  8.0  HGB 15.6 15.1  HCT 46.0 45.9  MCV  --  96  PLT  --  249   Lab Results  Component Value Date   TSH 2.660 09/15/2020   Lab Results  Component Value Date   HGBA1C 7.1 (A) 07/15/2023   Lab Results  Component Value Date   CHOL 176 11/10/2023   HDL 97 11/10/2023   LDLCALC 67 11/10/2023   LDLDIRECT 126.2 04/05/2013   TRIG 59 11/10/2023   CHOLHDL 1.8 11/10/2023    Significant Diagnostic Results in last 30 days:  No results found.  Assessment/Plan Contact dermatitis of right arm Chronic contact dermatitis on the right arm, likely due to an allergic reaction to adhesive used in bandaging. Present for about a month with itching and irritation. Differential diagnosis includes shingles, but distribution and presentation are inconsistent with shingles. Exacerbated by scratching, leading to increased inflammation. - Prescribe a small dose steroid cream to be applied in the morning and at night for a week to alleviate itching and inflammation.  BPH No acute concerns  CHF  Appears euvolemic on exam. No new medications at this time.   DM Previously well-controlled on current regimen, will collect A1c at this time.  Family/ staff Communication: nursing  Labs/tests ordered:  CBC, CMP A1c

## 2024-02-20 LAB — CBC AND DIFFERENTIAL
HCT: 46 (ref 41–53)
Hemoglobin: 15 (ref 13.5–17.5)
Neutrophils Absolute: 6853
Platelets: 285 K/uL (ref 150–400)
WBC: 9.4

## 2024-02-20 LAB — BASIC METABOLIC PANEL WITH GFR
BUN: 32 — AB (ref 4–21)
CO2: 27 — AB (ref 13–22)
Chloride: 103 (ref 99–108)
Creatinine: 1.4 — AB (ref 0.6–1.3)
Glucose: 107
Potassium: 4.5 meq/L (ref 3.5–5.1)
Sodium: 139 (ref 137–147)

## 2024-02-20 LAB — COMPREHENSIVE METABOLIC PANEL WITH GFR
Calcium: 9.9 (ref 8.7–10.7)
eGFR: 47

## 2024-02-20 LAB — HEMOGLOBIN A1C: Hemoglobin A1C: 7.2

## 2024-02-20 LAB — CBC: RBC: 4.88 (ref 3.87–5.11)

## 2024-04-16 ENCOUNTER — Non-Acute Institutional Stay: Payer: Self-pay | Admitting: Orthopedic Surgery

## 2024-04-16 ENCOUNTER — Encounter: Payer: Self-pay | Admitting: Orthopedic Surgery

## 2024-04-16 DIAGNOSIS — K219 Gastro-esophageal reflux disease without esophagitis: Secondary | ICD-10-CM

## 2024-04-16 DIAGNOSIS — T17308A Unspecified foreign body in larynx causing other injury, initial encounter: Secondary | ICD-10-CM

## 2024-04-16 DIAGNOSIS — Z86018 Personal history of other benign neoplasm: Secondary | ICD-10-CM

## 2024-04-16 DIAGNOSIS — N401 Enlarged prostate with lower urinary tract symptoms: Secondary | ICD-10-CM

## 2024-04-16 DIAGNOSIS — E1169 Type 2 diabetes mellitus with other specified complication: Secondary | ICD-10-CM | POA: Diagnosis not present

## 2024-04-16 DIAGNOSIS — E1121 Type 2 diabetes mellitus with diabetic nephropathy: Secondary | ICD-10-CM | POA: Diagnosis not present

## 2024-04-16 DIAGNOSIS — J3800 Paralysis of vocal cords and larynx, unspecified: Secondary | ICD-10-CM

## 2024-04-16 DIAGNOSIS — Z7984 Long term (current) use of oral hypoglycemic drugs: Secondary | ICD-10-CM

## 2024-04-16 DIAGNOSIS — E785 Hyperlipidemia, unspecified: Secondary | ICD-10-CM

## 2024-04-16 DIAGNOSIS — Z8673 Personal history of transient ischemic attack (TIA), and cerebral infarction without residual deficits: Secondary | ICD-10-CM

## 2024-04-16 DIAGNOSIS — I5020 Unspecified systolic (congestive) heart failure: Secondary | ICD-10-CM

## 2024-04-16 NOTE — Progress Notes (Signed)
 Location:  [facility].  Nursing Home Room Number: [place] [id number] Place of Service:  ALF [phone]) Provider:  Greig Cluster, NP  PCP: Laurence Locus, DO  Patient Care Team: Laurence Locus, DO as PCP - General (Internal Medicine) Perla Evalene PARAS, MD as PCP - Cardiology (Cardiology) Mittie Gaskin, MD as Referring Physician (Ophthalmology) Dasher, Alm LABOR, MD as Referring Physician (Dermatology)  Extended Emergency Contact Information Primary Emergency Contact: Vicci Grout Address: [address]           [place], [place] [address] [place] Work Phone: [phone] Mobile Phone: [phone] Relation: Spouse Secondary Emergency Contact: Bumgarder,Kay Address: [address]          [place], [place] [address] [place] Mobile Phone: [phone] Relation: Daughter  Code Status:  Full Goals of care: Advanced Directive information    04/16/2024   10:10 AM  Advanced Directives  Does Patient Have a Medical Advance Directive? Yes  Type of Estate Agent of [place];Living will  Does patient want to make changes to medical advance directive? No - Patient declined  Copy of Healthcare Power of Attorney in Chart? No - copy requested     Chief Complaint  Patient presents with   Medical Management of Chronic Issues    Medical Management of Chronic Issues.     HPI:  Pt is a 89 y.o. male seen today for medical management of chronic diseases.    He currently resides on the assisted living unit at [facility]. PMH: CHF,  h/o CVA 10/2019, vocal cord paralysis, CKD, T2DM with neuropathy, HLD, hearing loss> left cochlear implant 2020, OA, BPH.   Choking- nursing reports choking on piece of bacon while eating breakfast this morning, h/o vocal cord paralysis, he has had ST evaluation in past  T2DM- recent A1c 7.1 07/2023, no hypoglycemias, remains on Jardiance  CHF- LVEF 60-65% 11/23/2023, remains on metoprolol , Jardiance  and  losartan   HLD- total 176, LDL 67 11/2023, remains on atorvastatin  and Zetia  BPH- remains on finasteride  and tamsulosin  History of CVA- h/o CVA 10/2019, remains on atorvastatin  Vocal cord paralysis- onset post stroke 2022, see above History of pheochromocytoma- s/p right adrenalectomy 2006 GERD- h/o duodenal ulcer, remains on pantoprazole    Recent weights:  11/05- 205.8 lbs  10/05- 204 lbs  09/05- 200.6 lbs  Recent blood pressures:  11/05- 149/76  10/05- 99/65  09/05- 150/74    Past Medical History:  Diagnosis Date   Aortic atherosclerosis    Aortic stenosis 10/12/2019   a.) TEE 10/12/2019: visually mod AS (peak-peak gradient ~ 20 mmHg); b.) TTE 01/16/2020: mild-mod sclerosis with no stenosis (MPG 6; VTI = 2.47)   Arthritis    Ascending aorta dilatation 01/16/2020   a.) TTE 01/16/2020: asc Ao = 39 mm   Bacteremia due to group B Streptococcus 10/07/2019   Baker's cyst of knee, left    BPH (benign prostatic hypertrophy)    CAD (coronary artery disease) 11/28/2019   a.) R/LHC 11/28/2019: 90% oRI (R-L collaterals), 50% o-pLAD - med mgmt   Cerebral microvascular disease    Chronic back pain    Chronic cough    a.) secondary to tight esophagus   Cochlear implant in place (LEFT) 06/27/2018   DDD (degenerative disc disease), cervical    DDD (degenerative disc disease), lumbar    DDD (degenerative disc disease), thoracic    Duodenal ulcer    Erectile dysfunction    a.) on PDE5i (tadalafil )   Glaucoma  HFrEF (heart failure with reduced ejection fraction) (HCC)    a.) TTE 10/08/2019: EF 25-30%, mod LVH, sev mid-apical/antlat/apical/infsep AK, mild LA dil, mild-mod AoV scler, RVSP 40 ; b.) TEE 10/06/2019: EF 25-30% glob HK, sev AoV scler with visually mod AS (peak-peak grad ~20 mmHg); c.) R/LHC 11/28/2019: EF >65%. mRA 8, mPA 21, mPCWP 15, AO sat 97, PA sat 69, CO 5.8, CI 2.9; d.) TTE 01/16/2020: EF 60-65%, mild LVH, mild-mod AoV scler, asc Ao 39 mm, G1DD   History of CVA  (cerebrovascular accident) 10/2019   Hyperlipidemia associated with type 2 diabetes mellitus (HCC)    Hypertension    Hypoglycemia unawareness associated with type 2 diabetes mellitus (HCC)    Osteoarthritis    Paralysis of right vocal cord 10/2019   a.) unknown etiology; ENT consulted--> DDx included idiopathic cause, such as a viral neuropathy, or a subhemispheric stroke   Pheochromocytoma of right adrenal gland    a.) s/p RIGHT adrenalectomy   PSVT (paroxysmal supraventricular tachycardia) 09/29/2020   a.) Zio 09/29/2020: PSVT x 16 runs --> fastest lasting 4 beats (max rate 174 bpm) and longest lasting 8 beats (max rate 93 bpm)   Sensorineural hearing loss (SNHL), bilateral    a.) wears BILATERAL hearing aids; b.) s/p LEFT cochlear implant   Stage 3b chronic kidney disease (CKD) (HCC)    Stress-induced cardiomyopathy 10/08/2019   a.) TTE 10/08/2019: EF 25-30%; b.) TEE 10/06/2019: EF 25-30%; c.) R/LHC 11/28/2019: EF >65%; d.) TTE 01/16/2020: EF 60-65%   Temporomandibular joint osteoarthritis (LEFT)    Type II diabetes mellitus with nephropathy (HCC)    Wears hearing aid    bilateral   Past Surgical History:  Procedure Laterality Date   ADRENALECTOMY Right    CATARACT EXTRACTION W/PHACO Left 10/08/2015   Procedure: CATARACT EXTRACTION PHACO AND INTRAOCULAR LENS PLACEMENT (IOC) left eye;  Surgeon: Dene Etienne, MD;  Location: [facility];  Service: Ophthalmology;  Laterality: Left;  MALYUGIN   COCHLEAR IMPLANT Left 06/27/2018   CYSTOSCOPY WITH INSERTION OF UROLIFT N/A 02/15/2023   Procedure: CYSTOSCOPY WITH INSERTION OF UROLIFT;  Surgeon: Twylla Glendia BROCKS, MD;  Location: [facility];  Service: Urology;  Laterality: N/A;   HERNIA REPAIR     RIGHT/LEFT HEART CATH AND CORONARY ANGIOGRAPHY Left 11/28/2019   Procedure: RIGHT/LEFT HEART CATH AND CORONARY ANGIOGRAPHY;  Surgeon: Mady Bruckner, MD;  Location: [facility];  Service: Cardiovascular;  Laterality: Left;    SQUAMOUS CELL CARCINOMA EXCISION  03/2006   left ear   TEE WITHOUT CARDIOVERSION N/A 10/12/2019   Procedure: TRANSESOPHAGEAL ECHOCARDIOGRAM (TEE);  Surgeon: Perla Evalene PARAS, MD;  Location: [facility];  Service: Cardiovascular;  Laterality: N/A;   TONSILLECTOMY     XI ROBOTIC LAPAROSCOPIC ASSISTED APPENDECTOMY N/A 10/11/2019   Procedure: XI ROBOTIC ASSISTED LAPAROSCOPIC INSERTION GASTROSTOMY TUBE;  Surgeon: Jordis Laneta FALCON, MD;  Location: [facility];  Service: General;  Laterality: N/A;    No Known Allergies  Outpatient Encounter Medications as of 04/16/2024  Medication Sig   acetaminophen  (TYLENOL ) 325 MG tablet Take 650 mg by mouth every 4 (four) hours as needed.   amLODipine  (NORVASC ) 5 MG tablet TAKE ONE (1) TABLET BY MOUTH TWO TIMES PER DAY   atorvastatin  (LIPITOR) 20 MG tablet TAKE ONE TABLET BY MOUTH EVERY DAY   benzonatate  (TESSALON ) 200 MG capsule Take 1 capsule (200 mg total) by mouth 3 (three) times daily as needed for cough.   carbamide peroxide (DEBROX) 6.5 % OTIC solution Place 5 drops into both  ears as needed.   cetirizine (ZYRTEC) 5 MG tablet Take 5 mg by mouth daily as needed for allergies.   cholecalciferol (VITAMIN D3) 25 MCG (1000 UNIT) tablet Take 1,000 Units by mouth daily.   Coenzyme Q10 (COQ10) 100 MG CAPS Take 1 capsule by mouth daily.   cyanocobalamin (VITAMIN B12) 1000 MCG tablet Take 1,000 mcg by mouth daily.   dextromethorphan-guaiFENesin (ROBITUSSIN-DM) 10-100 MG/5ML liquid Take 10 mLs by mouth every 4 (four) hours as needed for cough.   dorzolamide-timolol  (COSOPT) 22.3-6.8 MG/ML ophthalmic solution Place 1 drop into both eyes 2 (two) times daily.   empagliflozin  (JARDIANCE ) 10 MG TABS tablet Take 1 tablet (10 mg total) by mouth daily before breakfast.   ezetimibe  (ZETIA ) 10 MG tablet TAKE ONE TABLET BY MOUTH EVERY DAY   finasteride  (PROPECIA ) 1 MG tablet Take 1 tablet (1 mg total) by mouth daily.   Glucose 15 GM/32ML GEL Take 1 packet by mouth as needed.    glucose blood (ONETOUCH ULTRA) test strip Use to check blood sugar once daily.   Lancets MISC 1 each by Does not apply route daily at 8 pm.   latanoprost  (XALATAN ) 0.005 % ophthalmic solution Place 1 drop into both eyes at bedtime.   losartan  (COZAAR ) 100 MG tablet TAKE ONE TABLET BY MOUTH EVERY DAY   metoprolol  succinate (TOPROL -XL) 25 MG 24 hr tablet TAKE 1 TABLET BY MOUTH DAILY   nystatin (MYCOSTATIN/NYSTOP) powder Apply 1 Application topically 2 (two) times daily as needed.   ondansetron  (ZOFRAN ) 4 MG tablet Take 4 mg by mouth 3 (three) times daily as needed for nausea or vomiting.   OXYGEN Inhale into the lungs. 2lpm   pantoprazole  (PROTONIX ) 20 MG tablet TAKE 1 TABLET BY MOUTH DAILY   polyethylene glycol (MIRALAX  / GLYCOLAX ) 17 g packet Take 17 g by mouth daily.   saccharomyces boulardii (FLORASTOR) 250 MG capsule Take 250 mg by mouth daily.   tamsulosin  (FLOMAX ) 0.4 MG CAPS capsule TAKE 2 CAPSULES EVERY DAY   tadalafil  (CIALIS ) 5 MG tablet Take 0.5-1 tablets (2.5-5 mg total) by mouth daily. (Patient not taking: Reported on 04/16/2024)   No facility-administered encounter medications on file as of 04/16/2024.    Review of Systems  Constitutional: Negative.   HENT:  Positive for trouble swallowing. Negative for sore throat.   Eyes:  Negative for visual disturbance.  Respiratory:  Negative for cough and shortness of breath.   Cardiovascular:  Negative for chest pain and leg swelling.  Gastrointestinal:  Negative for abdominal distention and abdominal pain.  Genitourinary:  Negative for dysuria and hematuria.  Musculoskeletal:  Positive for arthralgias and gait problem.  Skin:  Negative for wound.  Neurological:  Positive for weakness. Negative for dizziness and headaches.  Psychiatric/Behavioral:  Positive for confusion. Negative for decreased concentration and sleep disturbance. The patient is not nervous/anxious.     Immunization History  Administered Date(s) Administered    INFLUENZA, HIGH DOSE SEASONAL PF 01/25/2015, 02/17/2016, 03/03/2017, 03/06/2018, 02/10/2019, 02/11/2021   Influenza Whole 02/25/2009, 01/30/2010, 02/11/2012   Influenza-Unspecified 01/09/2013, 02/21/2020, 02/01/2023, 03/21/2024   MMR 12/11/1996   Moderna Covid-19 Vaccine Bivalent Booster 10yrs & up 02/11/2021   Moderna Sars-Covid-2 Vaccination 06/21/2019, 07/19/2019, 04/22/2020, 03/08/2022, 02/01/2023   Pneumococcal Conjugate-13 01/17/2014   Pneumococcal Polysaccharide-23 03/07/2006   RSV,unspecified 03/08/2022   Td 11/08/2006   Tdap 09/05/2013   Unspecified SARS-COV-2 Vaccination 03/30/2024   Zoster Recombinant(Shingrix) 08/27/2016, 11/24/2016   Zoster, Live 12/16/2005   Pertinent  Health Maintenance Due  Topic Date Due  FOOT EXAM  01/10/2024   HEMOGLOBIN A1C  01/12/2024   OPHTHALMOLOGY EXAM  04/27/2024   Influenza Vaccine  Completed      01/17/2022    5:20 PM 09/30/2022    3:12 PM 01/10/2023   10:22 AM 01/10/2023   10:43 AM 10/28/2023   11:13 AM  Fall Risk  Falls in the past year?  0 0 0 0  Was there an injury with Fall?  0 0  0  Fall Risk Category Calculator  0 0  0  (RETIRED) Patient Fall Risk Level Low fall risk       Patient at Risk for Falls Due to  No Fall Risks Impaired mobility  No Fall Risks  Fall risk Follow up  Falls evaluation completed Falls evaluation completed  Falls evaluation completed     Data saved with a previous flowsheet row definition   Functional Status Survey:    Vitals:   04/16/24 0959 04/16/24 1011  BP: (!) 149/76 99/65  Pulse: 60   Resp: 17   Temp: (!) 97.5 F (36.4 C)   SpO2: 94%   Weight: 205 lb 12.8 oz (93.4 kg)   Height: 5' 11 (1.803 m)    Body mass index is 28.7 kg/m. Physical Exam Vitals reviewed.  Constitutional:      General: He is not in acute distress. HENT:     Head: Normocephalic.     Ears:     Comments: Left cochlear implant    Nose: Nose normal.     Mouth/Throat:     Mouth: Mucous membranes are moist.  Eyes:      General:        Right eye: No discharge.        Left eye: No discharge.  Cardiovascular:     Rate and Rhythm: Normal rate and regular rhythm.     Pulses: Normal pulses.     Heart sounds: Normal heart sounds.  Pulmonary:     Effort: Pulmonary effort is normal.     Breath sounds: Normal breath sounds.  Abdominal:     General: Bowel sounds are normal. There is no distension.     Palpations: Abdomen is soft.     Tenderness: There is no abdominal tenderness.  Musculoskeletal:        General: Normal range of motion.     Cervical back: Neck supple.     Right lower leg: Edema present.     Left lower leg: Edema present.     Comments: Non pitting  Skin:    General: Skin is warm.     Capillary Refill: Capillary refill takes less than 2 seconds.  Neurological:     General: No focal deficit present.     Mental Status: He is alert. Mental status is at baseline.     Gait: Gait abnormal.  Psychiatric:        Mood and Affect: Mood normal.     Labs reviewed: Recent Labs    11/10/23 0952  NA 138  K 4.6  CL 100  CO2 23  GLUCOSE 160*  BUN 37*  CREATININE 1.52*  CALCIUM  9.6   No results for input(s): AST, ALT, ALKPHOS, BILITOT, PROT, ALBUMIN  in the last 8760 hours. Recent Labs    11/10/23 0952  WBC 8.0  HGB 15.1  HCT 45.9  MCV 96  PLT 249   Lab Results  Component Value Date   TSH 2.660 09/15/2020   Lab Results  Component Value Date   HGBA1C  7.1 (A) 07/15/2023   Lab Results  Component Value Date   CHOL 176 11/10/2023   HDL 97 11/10/2023   LDLCALC 67 11/10/2023   LDLDIRECT 126.2 04/05/2013   TRIG 59 11/10/2023   CHOLHDL 1.8 11/10/2023    Significant Diagnostic Results in last 30 days:  No results found.  Assessment/Plan 1. Choking, initial encounter (Primary) - episode 11/10 while eating breakfast - h/o vocal chord paralysis after CVA - seen ST in past> recorder eval  2. Type II diabetes mellitus with nephropathy (HCC) - A1c 7.1 07/2023 - no  hypoglycemias - foot exam done - neuropathy stable - cont Jardiance   3. HFrEF (heart failure with reduced ejection fraction) (HCC) - compensated - LVEF LVEF 60-65% 11/23/2023 - cont metoprolol , Jardiance  and losartan    4. Hyperlipidemia associated with type 2 diabetes mellitus (HCC) - LDL 67 11/2023 - cont atorvastatin   5. Benign prostatic hyperplasia with lower urinary tract symptoms, symptom details unspecified - cont finasteride  and tamsulosin   6. History of CVA (cerebrovascular accident) - cont atorvastatin   7. Vocal cord paralysis - secondary to CVA 10/2019  8. History of pheochromocytoma - s/p adrenalectomy 2006  9. Gastroesophageal reflux disease without esophagitis - hgb 15.1 11/2023 - cont pantoprazole     Family/ staff Communication: plan discussed with patient and nurse  Labs/tests ordered:  cbc/diff, cmp, TSH, A1c 11/13

## 2024-04-19 LAB — HEPATIC FUNCTION PANEL
ALT: 27 U/L (ref 10–40)
AST: 21 (ref 14–40)
Alkaline Phosphatase: 82 (ref 25–125)
Bilirubin, Total: 0.6

## 2024-04-19 LAB — BASIC METABOLIC PANEL WITH GFR
BUN: 27 — AB (ref 4–21)
CO2: 24 — AB (ref 13–22)
Chloride: 104 (ref 99–108)
Creatinine: 1.3 (ref 0.6–1.3)
Glucose: 138
Potassium: 4.6 meq/L (ref 3.5–5.1)
Sodium: 139 (ref 137–147)

## 2024-04-19 LAB — HEMOGLOBIN A1C: Hemoglobin A1C: 7.6

## 2024-04-19 LAB — CBC AND DIFFERENTIAL
HCT: 46 (ref 41–53)
Hemoglobin: 14.9 (ref 13.5–17.5)
Neutrophils Absolute: 4644
Platelets: 283 K/uL (ref 150–400)
WBC: 6.9

## 2024-04-19 LAB — COMPREHENSIVE METABOLIC PANEL WITH GFR
Albumin: 4.2 (ref 3.5–5.0)
Calcium: 9.4 (ref 8.7–10.7)
Globulin: 2.5
eGFR: 54

## 2024-04-19 LAB — TSH: TSH: 2.32 (ref 0.41–5.90)

## 2024-04-19 LAB — CBC: RBC: 4.93 (ref 3.87–5.11)

## 2024-05-01 ENCOUNTER — Non-Acute Institutional Stay: Payer: Self-pay | Admitting: Nurse Practitioner

## 2024-05-01 ENCOUNTER — Encounter: Payer: Self-pay | Admitting: Nurse Practitioner

## 2024-05-01 DIAGNOSIS — Z0000 Encounter for general adult medical examination without abnormal findings: Secondary | ICD-10-CM

## 2024-05-01 NOTE — Progress Notes (Signed)
 Chief Complaint  Patient presents with   Medicare Wellness    AWV     Subjective:   Dustin Harrison is a 89 y.o. male who presents for a Medicare Annual Wellness Visit.  Allergies (verified) Patient has no known allergies.   History: Past Medical History:  Diagnosis Date   Aortic atherosclerosis    Aortic stenosis 10/12/2019   a.) TEE 10/12/2019: visually mod AS (peak-peak gradient ~ 20 mmHg); b.) TTE 01/16/2020: mild-mod sclerosis with no stenosis (MPG 6; VTI = 2.47)   Arthritis    Ascending aorta dilatation 01/16/2020   a.) TTE 01/16/2020: asc Ao = 39 mm   Bacteremia due to group B Streptococcus 10/07/2019   Baker's cyst of knee, left    BPH (benign prostatic hypertrophy)    CAD (coronary artery disease) 11/28/2019   a.) R/LHC 11/28/2019: 90% oRI (R-L collaterals), 50% o-pLAD - med mgmt   Cerebral microvascular disease    Chronic back pain    Chronic cough    a.) secondary to tight esophagus   Cochlear implant in place (LEFT) 06/27/2018   DDD (degenerative disc disease), cervical    DDD (degenerative disc disease), lumbar    DDD (degenerative disc disease), thoracic    Duodenal ulcer    Erectile dysfunction    a.) on PDE5i (tadalafil )   Glaucoma    HFrEF (heart failure with reduced ejection fraction) (HCC)    a.) TTE 10/08/2019: EF 25-30%, mod LVH, sev mid-apical/antlat/apical/infsep AK, mild LA dil, mild-mod AoV scler, RVSP 40 ; b.) TEE 10/06/2019: EF 25-30% glob HK, sev AoV scler with visually mod AS (peak-peak grad ~20 mmHg); c.) R/LHC 11/28/2019: EF >65%. mRA 8, mPA 21, mPCWP 15, AO sat 97, PA sat 69, CO 5.8, CI 2.9; d.) TTE 01/16/2020: EF 60-65%, mild LVH, mild-mod AoV scler, asc Ao 39 mm, G1DD   History of CVA (cerebrovascular accident) 10/2019   Hyperlipidemia associated with type 2 diabetes mellitus (HCC)    Hypertension    Hypoglycemia unawareness associated with type 2 diabetes mellitus (HCC)    Osteoarthritis    Paralysis of right vocal cord 10/2019    a.) unknown etiology; ENT consulted--> DDx included idiopathic cause, such as a viral neuropathy, or a subhemispheric stroke   Pheochromocytoma of right adrenal gland    a.) s/p RIGHT adrenalectomy   PSVT (paroxysmal supraventricular tachycardia) 09/29/2020   a.) Zio 09/29/2020: PSVT x 16 runs --> fastest lasting 4 beats (max rate 174 bpm) and longest lasting 8 beats (max rate 93 bpm)   Sensorineural hearing loss (SNHL), bilateral    a.) wears BILATERAL hearing aids; b.) s/p LEFT cochlear implant   Stage 3b chronic kidney disease (CKD) (HCC)    Stress-induced cardiomyopathy 10/08/2019   a.) TTE 10/08/2019: EF 25-30%; b.) TEE 10/06/2019: EF 25-30%; c.) R/LHC 11/28/2019: EF >65%; d.) TTE 01/16/2020: EF 60-65%   Temporomandibular joint osteoarthritis (LEFT)    Type II diabetes mellitus with nephropathy (HCC)    Wears hearing aid    bilateral   Past Surgical History:  Procedure Laterality Date   ADRENALECTOMY Right    CATARACT EXTRACTION W/PHACO Left 10/08/2015   Procedure: CATARACT EXTRACTION PHACO AND INTRAOCULAR LENS PLACEMENT (IOC) left eye;  Surgeon: Dene Etienne, MD;  Location: [facility];  Service: Ophthalmology;  Laterality: Left;  MALYUGIN   COCHLEAR IMPLANT Left 06/27/2018   CYSTOSCOPY WITH INSERTION OF UROLIFT N/A 02/15/2023   Procedure: CYSTOSCOPY WITH INSERTION OF UROLIFT;  Surgeon: Twylla Glendia BROCKS, MD;  Location:  [facility];  Service: Urology;  Laterality: N/A;   HERNIA REPAIR     RIGHT/LEFT HEART CATH AND CORONARY ANGIOGRAPHY Left 11/28/2019   Procedure: RIGHT/LEFT HEART CATH AND CORONARY ANGIOGRAPHY;  Surgeon: Mady Bruckner, MD;  Location: [facility];  Service: Cardiovascular;  Laterality: Left;   SQUAMOUS CELL CARCINOMA EXCISION  03/2006   left ear   TEE WITHOUT CARDIOVERSION N/A 10/12/2019   Procedure: TRANSESOPHAGEAL ECHOCARDIOGRAM (TEE);  Surgeon: Perla Evalene PARAS, MD;  Location: [facility];  Service: Cardiovascular;  Laterality: N/A;    TONSILLECTOMY     XI ROBOTIC LAPAROSCOPIC ASSISTED APPENDECTOMY N/A 10/11/2019   Procedure: XI ROBOTIC ASSISTED LAPAROSCOPIC INSERTION GASTROSTOMY TUBE;  Surgeon: Jordis Laneta FALCON, MD;  Location: [facility];  Service: General;  Laterality: N/A;   Family History  Problem Relation Age of Onset   Alcohol abuse Mother    Coronary artery disease Mother    Brain cancer Father        tumor   Diabetes Brother    Social History   Occupational History   Occupation: chaplain  Tobacco Use   Smoking status: Never    Passive exposure: Never   Smokeless tobacco: Never  Vaping Use   Vaping status: Never Used  Substance and Sexual Activity   Alcohol use: Not Currently    Alcohol/week: 1.0 standard drink of alcohol    Types: 1 Glasses of wine per week    Comment: 1 glass per week   Drug use: No   Sexual activity: Not Currently   Tobacco Counseling Counseling given: Not Answered  SDOH Screenings   Food Insecurity: No Food Insecurity (10/28/2023)  Housing: Unknown (10/28/2023)  Transportation Needs: No Transportation Needs (10/28/2023)  Utilities: Not At Risk (10/28/2023)  Alcohol Screen: Low Risk  (01/08/2023)  Depression (PHQ2-9): Low Risk  (05/01/2024)  Financial Resource Strain: Low Risk  (01/08/2023)  Physical Activity: Unknown (01/08/2023)  Social Connections: Socially Integrated (01/08/2023)  Stress: No Stress Concern Present (01/08/2023)  Tobacco Use: Low Risk  (05/01/2024)   See flowsheets for full screening details  Depression Screen PHQ 2 & 9 Depression Scale- Over the past 2 weeks, how often have you been bothered by any of the following problems? Little interest or pleasure in doing things: 0 Feeling down, depressed, or hopeless (PHQ Adolescent also includes...irritable): 0 PHQ-2 Total Score: 0     Goals Addressed   None    Visit info / Clinical Intake: Medicare Wellness Visit Type:: Subsequent Annual Wellness Visit Persons participating in visit:: patient Medicare Wellness  Visit Mode:: In-person (required for WTM) Information given by:: patient Interpreter Needed?: No Pre-visit prep was completed: no AWV questionnaire completed by patient prior to visit?: no Living arrangements:: in retirement community Patient's Overall Health Status Rating: excellent Typical amount of pain: none Does pain affect daily life?: no Are you currently prescribed opioids?: no  Dietary Habits and Nutritional Risks How many meals a day?: 3 Eats fruit and vegetables daily?: yes Most meals are obtained by: having others provide food In the last 2 weeks, have you had any of the following?: none Diabetic:: (!) yes Any non-healing wounds?: no How often do you check your BS?: 1  Functional Status Activities of Daily Living (to include ambulation/medication): Independent Ambulation: Independent Medication Administration: Needs assistance (comment) Is this a change from baseline?: Pre-admission baseline Home Management: Needs assistance (comment) Manage your own finances?: (!) no (daughter helps manage) Primary transportation is: facility / other Concerns about vision?: (!) yes Concerns about hearing?: (!) yes  Fall Screening Falls in the past year?: 0 Number of falls in past year: 0 Was there an injury with Fall?: 0 Fall Risk Category Calculator: 0 Patient Fall Risk Level: Low Fall Risk  Fall Risk Patient at Risk for Falls Due to: Impaired mobility; Impaired balance/gait Fall risk Follow up: Falls evaluation completed  Home and Transportation Safety: All rugs have non-skid backing?: N/A, no rugs All stairs or steps have railings?: yes Grab bars in the bathtub or shower?: yes Have non-skid surface in bathtub or shower?: yes Good home lighting?: yes Regular seat belt use?: yes Hospital stays in the last year:: no  Cognitive Assessment Difficulty concentrating, remembering, or making decisions? : no  Advance Directives (For Healthcare) Does Patient Have a Medical  Advance Directive?: Yes Does patient want to make changes to medical advance directive?: No - Patient declined Type of Advance Directive: Healthcare Power of [place]; Living will Copy of Healthcare Power of Attorney in Chart?: No - copy requested Copy of Living Will in Chart?: No - copy requested  Reviewed/Updated  Reviewed/Updated: Reviewed All (Medical, Surgical, Family, Medications, Allergies, Care Teams, Patient Goals)        Objective:    Today's Vitals   05/01/24 0826  BP: (!) 149/76  Pulse: 60  Resp: 17  Temp: (!) 97.5 F (36.4 C)  SpO2: 94%  Weight: 205 lb 12.8 oz (93.4 kg)  Height: 5' 11 (1.803 m)   Body mass index is 28.7 kg/m.  Current Medications (verified) Outpatient Encounter Medications as of 05/01/2024  Medication Sig   acetaminophen  (TYLENOL ) 325 MG tablet Take 650 mg by mouth every 4 (four) hours as needed.   amLODipine  (NORVASC ) 5 MG tablet TAKE ONE (1) TABLET BY MOUTH TWO TIMES PER DAY   atorvastatin  (LIPITOR) 20 MG tablet TAKE ONE TABLET BY MOUTH EVERY DAY   benzonatate  (TESSALON ) 200 MG capsule Take 1 capsule (200 mg total) by mouth 3 (three) times daily as needed for cough.   carbamide peroxide (DEBROX) 6.5 % OTIC solution Place 5 drops into both ears as needed.   cetirizine (ZYRTEC) 5 MG tablet Take 5 mg by mouth daily as needed for allergies.   cholecalciferol (VITAMIN D3) 25 MCG (1000 UNIT) tablet Take 1,000 Units by mouth daily.   Coenzyme Q10 (COQ10) 100 MG CAPS Take 1 capsule by mouth daily.   cyanocobalamin (VITAMIN B12) 1000 MCG tablet Take 1,000 mcg by mouth daily.   dextromethorphan-guaiFENesin (ROBITUSSIN-DM) 10-100 MG/5ML liquid Take 10 mLs by mouth every 4 (four) hours as needed for cough.   dorzolamide-timolol  (COSOPT) 22.3-6.8 MG/ML ophthalmic solution Place 1 drop into both eyes 2 (two) times daily.   empagliflozin  (JARDIANCE ) 10 MG TABS tablet Take 1 tablet (10 mg total) by mouth daily before breakfast.   ezetimibe  (ZETIA ) 10 MG  tablet TAKE ONE TABLET BY MOUTH EVERY DAY   finasteride  (PROPECIA ) 1 MG tablet Take 1 tablet (1 mg total) by mouth daily.   Glucose 15 GM/32ML GEL Take 1 packet by mouth as needed.   glucose blood (ONETOUCH ULTRA) test strip Use to check blood sugar once daily.   Lancets MISC 1 each by Does not apply route daily at 8 pm.   latanoprost  (XALATAN ) 0.005 % ophthalmic solution Place 1 drop into both eyes at bedtime.   losartan  (COZAAR ) 100 MG tablet TAKE ONE TABLET BY MOUTH EVERY DAY   metoprolol  succinate (TOPROL -XL) 25 MG 24 hr tablet TAKE 1 TABLET BY MOUTH DAILY   nystatin (MYCOSTATIN/NYSTOP) powder Apply 1 Application  topically 2 (two) times daily as needed.   ondansetron  (ZOFRAN ) 4 MG tablet Take 4 mg by mouth 3 (three) times daily as needed for nausea or vomiting.   OXYGEN Inhale into the lungs. 2lpm   pantoprazole  (PROTONIX ) 20 MG tablet TAKE 1 TABLET BY MOUTH DAILY   polyethylene glycol (MIRALAX  / GLYCOLAX ) 17 g packet Take 17 g by mouth daily.   saccharomyces boulardii (FLORASTOR) 250 MG capsule Take 250 mg by mouth daily.   tamsulosin  (FLOMAX ) 0.4 MG CAPS capsule TAKE 2 CAPSULES EVERY DAY   No facility-administered encounter medications on file as of 05/01/2024.   Hearing/Vision screen No results found. Immunizations and Health Maintenance Health Maintenance  Topic Date Due   DTaP/Tdap/Td (3 - Td or Tdap) 09/06/2023   Medicare Annual Wellness (AWV)  01/10/2024   OPHTHALMOLOGY EXAM  04/27/2024   COVID-19 Vaccine (8 - Moderna risk 2025-26 season) 09/28/2024   HEMOGLOBIN A1C  10/17/2024   FOOT EXAM  04/16/2025   Pneumococcal Vaccine: 50+ Years  Completed   Influenza Vaccine  Completed   Zoster Vaccines- Shingrix  Completed   Meningococcal B Vaccine  Aged Out        Assessment/Plan:  This is a routine wellness examination for Dustin Harrison.  Patient Care Team: Laurence Locus, DO as PCP - General (Internal Medicine) Perla Evalene PARAS, MD as PCP - Cardiology (Cardiology) Mittie Gaskin, MD as Referring Physician (Ophthalmology) Dasher, Alm LABOR, MD as Referring Physician (Dermatology)  I have personally reviewed and noted the following in the patient's chart:   Medical and social history Use of alcohol, tobacco or illicit drugs  Current medications and supplements including opioid prescriptions. Functional ability and status Nutritional status Physical activity Advanced directives List of other physicians Hospitalizations, surgeries, and ER visits in previous 12 months Vitals Screenings to include cognitive, depression, and falls Referrals and appointments  Orders Placed This Encounter  Procedures   CBC and differential    This external order was created through the Results Console.   CBC    This external order was created through the Results Console.   Basic metabolic panel with GFR    This external order was created through the Results Console.   Comprehensive metabolic panel with GFR    This external order was created through the Results Console.   Hemoglobin A1c    This external order was created through the Results Console.   CBC and differential    This external order was created through the Results Console.   CBC    This external order was created through the Results Console.   Basic metabolic panel with GFR    This external order was created through the Results Console.   Comprehensive metabolic panel with GFR    This external order was created through the Results Console.   Hepatic function panel    This external order was created through the Results Console.   Hemoglobin A1c    This external order was created through the Results Console.   TSH    This external order was created through the Results Console.   In addition, I have reviewed and discussed with patient certain preventive protocols, quality metrics, and best practice recommendations. A written personalized care plan for preventive services as well as general preventive health  recommendations were provided to patient.   Harlene MARLA An, NP   05/01/2024

## 2024-05-01 NOTE — Patient Instructions (Signed)
 [name],  Thank you for taking the time for your Medicare Wellness Visit. I appreciate your continued commitment to your health goals. Please review the care plan we discussed, and feel free to reach out if I can assist you further.  Please note that Annual Wellness Visits do not include a physical exam. Some assessments may be limited, especially if the visit was conducted virtually. If needed, we may recommend an in-person follow-up with your provider.  Ongoing Care Seeing your primary care provider every 3 to 6 months helps us  monitor your health and provide consistent, personalized care.  Referrals If a referral was made during today's visit and you haven't received any updates within two weeks, please contact the referred provider directly to check on the status.  Recommended Screenings:  Health Maintenance  Topic Date Due   DTaP/Tdap/Td vaccine (3 - Td or Tdap) 09/06/2023   Medicare Annual Wellness Visit  01/10/2024   Eye exam for diabetics  04/27/2024   COVID-19 Vaccine (8 - Moderna risk 2025-26 season) 09/28/2024   Hemoglobin A1C  10/17/2024   Complete foot exam   04/16/2025   Pneumococcal Vaccine for age over 38  Completed   Flu Shot  Completed   Zoster (Shingles) Vaccine  Completed   Meningitis B Vaccine  Aged Out       05/01/2024    2:55 PM  Advanced Directives  Does Patient Have a Medical Advance Directive? Yes  Type of Estate Agent of [place];Living will  Copy of Healthcare Power of Attorney in Chart? No - copy requested    Vision: Annual vision screenings are recommended for early detection of glaucoma, cataracts, and diabetic retinopathy. These exams can also reveal signs of chronic conditions such as diabetes and high blood pressure.  Dental: Annual dental screenings help detect early signs of oral cancer, gum disease, and other conditions linked to overall health, including heart disease and diabetes.  Please see the attached  documents for additional preventive care recommendations.

## 2024-06-19 ENCOUNTER — Encounter: Payer: Self-pay | Admitting: Nurse Practitioner

## 2024-06-19 ENCOUNTER — Non-Acute Institutional Stay: Payer: Self-pay | Admitting: Nurse Practitioner

## 2024-06-19 DIAGNOSIS — J189 Pneumonia, unspecified organism: Secondary | ICD-10-CM | POA: Diagnosis not present

## 2024-06-19 DIAGNOSIS — I10 Essential (primary) hypertension: Secondary | ICD-10-CM

## 2024-06-19 MED ORDER — CEFADROXIL 500 MG PO CAPS: 500.0000 mg | ORAL_CAPSULE | Freq: Two times a day (BID) | ORAL | 0 refills | Status: AC

## 2024-06-19 MED ORDER — IPRATROPIUM-ALBUTEROL 0.5-2.5 (3) MG/3ML IN SOLN: 3.0000 mL | Freq: Two times a day (BID) | RESPIRATORY_TRACT | 0 refills | Status: AC

## 2024-06-19 MED ORDER — AZITHROMYCIN 500 MG PO TABS: 500.0000 mg | ORAL_TABLET | Freq: Every day | ORAL | 0 refills | Status: AC

## 2024-06-19 NOTE — Progress Notes (Signed)
 " Location:  Other [facility].  Nursing Home Room Number: Delphine [place] [id number] Place of Service:  ALF (13) [facility]  PCP: Laurence Locus, DO  Patient Care Team: Laurence Locus, DO as PCP - General (Internal Medicine) Perla Evalene PARAS, MD as PCP - Cardiology (Cardiology) Mittie Gaskin, MD as Referring Physician (Ophthalmology) Dasher, Alm LABOR, MD as Referring Physician (Dermatology)  Extended Emergency Contact Information Primary Emergency Contact: Vicci Grout Address: [address]           [place], [place] [address] [place] Work Phone: [phone] Mobile Phone: [phone] Relation: Spouse Secondary Emergency Contact: Bumgarder,Kay Address: [address]          [place], [place] [address] [place] Mobile Phone: [phone] Relation: Daughter  Goals of care: Advanced Directive information    05/01/2024    2:55 PM  Advanced Directives  Does Patient Have a Medical Advance Directive? Yes  Type of Estate Agent of [place];Living will  Copy of Healthcare Power of Attorney in Chart? No - copy requested     Chief Complaint  Patient presents with   Cough and Congestion.     Cough and Congestion.     HPI:  Pt is a 89 y.o. male seen today for an acute visit for Cough with abnormal chest xray.   He has experienced a chronic cough for the past year, which has recently worsened. The cough is particularly triggered by eating, especially with foods like crispy bacon.  [place] living in [place] and nurse reports that when he got back from christmas with family it was noted that his family and wife had been diagnosised with flu. He tested negative.  No fever, chills, shortness of breath, or chest congestion.  Chest xray from 06/18/2024 SIGNIFICANT FINDINGS Chest X-Ray AP/LAT: . CHEST AP/LATERAL: AP and lateral chest reveal cardiac and mediastinal structures are intact. Left basilar infiltrate present and  costophrenic angles are clear. SABRA IMPRESSION: Interval development of left basilar infiltrate since 11-15-2023. . . Electronically Signed By: Dr. Lamar Sharps M.D.  Miss Stearns notes that he has been more confused lately, although he is generally nice.   There is no report of shortness of breath or leg swelling.      Past Medical History:  Diagnosis Date   Aortic atherosclerosis    Aortic stenosis 10/12/2019   a.) TEE 10/12/2019: visually mod AS (peak-peak gradient ~ 20 mmHg); b.) TTE 01/16/2020: mild-mod sclerosis with no stenosis (MPG 6; VTI = 2.47)   Arthritis    Ascending aorta dilatation 01/16/2020   a.) TTE 01/16/2020: asc Ao = 39 mm   Bacteremia due to group B Streptococcus 10/07/2019   Baker's cyst of knee, left    BPH (benign prostatic hypertrophy)    CAD (coronary artery disease) 11/28/2019   a.) R/LHC 11/28/2019: 90% oRI (R-L collaterals), 50% o-pLAD - med mgmt   Cerebral microvascular disease    Chronic back pain    Chronic cough    a.) secondary to tight esophagus   Cochlear implant in place (LEFT) 06/27/2018   DDD (degenerative disc disease), cervical    DDD (degenerative disc disease), lumbar    DDD (degenerative disc disease), thoracic    Duodenal ulcer    Erectile dysfunction    a.) on PDE5i (tadalafil )   Glaucoma    HFrEF (heart failure with reduced ejection fraction) (HCC)    a.) TTE 10/08/2019: EF 25-30%, mod LVH, sev mid-apical/antlat/apical/infsep AK, mild LA dil, mild-mod  AoV scler, RVSP 40 ; b.) TEE 10/06/2019: EF 25-30% glob HK, sev AoV scler with visually mod AS (peak-peak grad ~20 mmHg); c.) R/LHC 11/28/2019: EF >65%. mRA 8, mPA 21, mPCWP 15, AO sat 97, PA sat 69, CO 5.8, CI 2.9; d.) TTE 01/16/2020: EF 60-65%, mild LVH, mild-mod AoV scler, asc Ao 39 mm, G1DD   History of CVA (cerebrovascular accident) 10/2019   Hyperlipidemia associated with type 2 diabetes mellitus (HCC)    Hypertension    Hypoglycemia unawareness associated with type 2  diabetes mellitus (HCC)    Osteoarthritis    Paralysis of right vocal cord 10/2019   a.) unknown etiology; ENT consulted--> DDx included idiopathic cause, such as a viral neuropathy, or a subhemispheric stroke   Pheochromocytoma of right adrenal gland    a.) s/p RIGHT adrenalectomy   PSVT (paroxysmal supraventricular tachycardia) 09/29/2020   a.) Zio 09/29/2020: PSVT x 16 runs --> fastest lasting 4 beats (max rate 174 bpm) and longest lasting 8 beats (max rate 93 bpm)   Sensorineural hearing loss (SNHL), bilateral    a.) wears BILATERAL hearing aids; b.) s/p LEFT cochlear implant   Stage 3b chronic kidney disease (CKD) (HCC)    Stress-induced cardiomyopathy 10/08/2019   a.) TTE 10/08/2019: EF 25-30%; b.) TEE 10/06/2019: EF 25-30%; c.) R/LHC 11/28/2019: EF >65%; d.) TTE 01/16/2020: EF 60-65%   Temporomandibular joint osteoarthritis (LEFT)    Type II diabetes mellitus with nephropathy (HCC)    Wears hearing aid    bilateral   Past Surgical History:  Procedure Laterality Date   ADRENALECTOMY Right    CATARACT EXTRACTION W/PHACO Left 10/08/2015   Procedure: CATARACT EXTRACTION PHACO AND INTRAOCULAR LENS PLACEMENT (IOC) left eye;  Surgeon: Dene Etienne, MD;  Location: [facility];  Service: Ophthalmology;  Laterality: Left;  MALYUGIN   COCHLEAR IMPLANT Left 06/27/2018   CYSTOSCOPY WITH INSERTION OF UROLIFT N/A 02/15/2023   Procedure: CYSTOSCOPY WITH INSERTION OF UROLIFT;  Surgeon: Twylla Glendia BROCKS, MD;  Location: [facility];  Service: Urology;  Laterality: N/A;   HERNIA REPAIR     RIGHT/LEFT HEART CATH AND CORONARY ANGIOGRAPHY Left 11/28/2019   Procedure: RIGHT/LEFT HEART CATH AND CORONARY ANGIOGRAPHY;  Surgeon: Mady Bruckner, MD;  Location: [facility];  Service: Cardiovascular;  Laterality: Left;   SQUAMOUS CELL CARCINOMA EXCISION  03/2006   left ear   TEE WITHOUT CARDIOVERSION N/A 10/12/2019   Procedure: TRANSESOPHAGEAL ECHOCARDIOGRAM (TEE);  Surgeon: Perla Evalene PARAS, MD;  Location: [facility];  Service: Cardiovascular;  Laterality: N/A;   TONSILLECTOMY     XI ROBOTIC LAPAROSCOPIC ASSISTED APPENDECTOMY N/A 10/11/2019   Procedure: XI ROBOTIC ASSISTED LAPAROSCOPIC INSERTION GASTROSTOMY TUBE;  Surgeon: Jordis Laneta FALCON, MD;  Location: [facility];  Service: General;  Laterality: N/A;    Allergies[1]  Outpatient Encounter Medications as of 06/19/2024  Medication Sig   acetaminophen  (TYLENOL ) 325 MG tablet Take 650 mg by mouth every 4 (four) hours as needed.   amLODipine  (NORVASC ) 5 MG tablet TAKE ONE (1) TABLET BY MOUTH TWO TIMES PER DAY   atorvastatin  (LIPITOR) 20 MG tablet TAKE ONE TABLET BY MOUTH EVERY DAY   benzonatate  (TESSALON ) 200 MG capsule Take 1 capsule (200 mg total) by mouth 3 (three) times daily as needed for cough.   carbamide peroxide (DEBROX) 6.5 % OTIC solution Place 5 drops into both ears as needed.   cetirizine (ZYRTEC) 5 MG tablet Take 5 mg by mouth daily as needed for allergies.   cholecalciferol (VITAMIN D3) 25 MCG (  1000 UNIT) tablet Take 1,000 Units by mouth daily.   Coenzyme Q10 (COQ10) 100 MG CAPS Take 1 capsule by mouth daily.   cyanocobalamin (VITAMIN B12) 1000 MCG tablet Take 1,000 mcg by mouth daily.   dextromethorphan-guaiFENesin (ROBITUSSIN-DM) 10-100 MG/5ML liquid Take 10 mLs by mouth every 4 (four) hours as needed for cough.   dorzolamide-timolol  (COSOPT) 22.3-6.8 MG/ML ophthalmic solution Place 1 drop into both eyes 2 (two) times daily.   empagliflozin  (JARDIANCE ) 10 MG TABS tablet Take 1 tablet (10 mg total) by mouth daily before breakfast.   ezetimibe  (ZETIA ) 10 MG tablet TAKE ONE TABLET BY MOUTH EVERY DAY   finasteride  (PROPECIA ) 1 MG tablet Take 1 tablet (1 mg total) by mouth daily.   Glucose 15 GM/32ML GEL Take 1 packet by mouth as needed.   glucose blood (ONETOUCH ULTRA) test strip Use to check blood sugar once daily.   Lancets MISC 1 each by Does not apply route daily at 8 pm.   latanoprost  (XALATAN ) 0.005 %  ophthalmic solution Place 1 drop into both eyes at bedtime.   losartan  (COZAAR ) 100 MG tablet TAKE ONE TABLET BY MOUTH EVERY DAY   metoprolol  succinate (TOPROL -XL) 25 MG 24 hr tablet TAKE 1 TABLET BY MOUTH DAILY   nystatin (MYCOSTATIN/NYSTOP) powder Apply 1 Application topically 2 (two) times daily as needed.   ondansetron  (ZOFRAN ) 4 MG tablet Take 4 mg by mouth 3 (three) times daily as needed for nausea or vomiting.   OXYGEN Inhale into the lungs. 2lpm   pantoprazole  (PROTONIX ) 20 MG tablet TAKE 1 TABLET BY MOUTH DAILY   polyethylene glycol (MIRALAX  / GLYCOLAX ) 17 g packet Take 17 g by mouth daily.   saccharomyces boulardii (FLORASTOR) 250 MG capsule Take 250 mg by mouth daily.   tamsulosin  (FLOMAX ) 0.4 MG CAPS capsule TAKE 2 CAPSULES EVERY DAY   No facility-administered encounter medications on file as of 06/19/2024.    Review of Systems  Constitutional:  Negative for activity change, appetite change, fatigue and unexpected weight change.  HENT:  Positive for hearing loss. Negative for congestion.   Eyes: Negative.   Respiratory:  Positive for cough. Negative for shortness of breath and wheezing.   Cardiovascular:  Negative for chest pain, palpitations and leg swelling.  Gastrointestinal:  Negative for abdominal pain, constipation and diarrhea.  Genitourinary:  Negative for difficulty urinating and dysuria.  Musculoskeletal:  Negative for arthralgias and myalgias.  Skin:  Negative for color change and wound.  Neurological:  Negative for dizziness and weakness.  Psychiatric/Behavioral:  Positive for confusion. Negative for agitation and behavioral problems.     Immunization History  Administered Date(s) Administered   INFLUENZA, HIGH DOSE SEASONAL PF 01/25/2015, 02/17/2016, 03/03/2017, 03/06/2018, 02/10/2019, 02/11/2021   Influenza Whole 02/25/2009, 01/30/2010, 02/11/2012   Influenza-Unspecified 01/09/2013, 02/21/2020, 02/01/2023, 03/21/2024   MMR 12/11/1996   Moderna Covid-19  Vaccine Bivalent Booster 25yrs & up 02/11/2021   Moderna Sars-Covid-2 Vaccination 06/21/2019, 07/19/2019, 04/22/2020, 03/08/2022, 02/01/2023   Pneumococcal Conjugate-13 01/17/2014   Pneumococcal Polysaccharide-23 03/07/2006   RSV,unspecified 03/08/2022   Td 11/08/2006   Tdap 09/05/2013   Unspecified SARS-COV-2 Vaccination 03/30/2024   Zoster Recombinant(Shingrix) 08/27/2016, 11/24/2016   Zoster, Live 12/16/2005   Pertinent  Health Maintenance Due  Topic Date Due   OPHTHALMOLOGY EXAM  04/27/2024   HEMOGLOBIN A1C  10/17/2024   FOOT EXAM  04/16/2025   Influenza Vaccine  Completed      01/10/2023   10:22 AM 01/10/2023   10:43 AM 10/28/2023   11:13 AM 04/16/2024  3:49 PM 05/01/2024    2:55 PM  Fall Risk  Falls in the past year? 0 0 0 1 0  Was there an injury with Fall? 0   0  0  0   Fall Risk Category Calculator 0  0 1 0  Patient at Risk for Falls Due to Impaired mobility  No Fall Risks Impaired balance/gait Impaired mobility;Impaired balance/gait  Fall risk Follow up Falls evaluation completed  Falls evaluation completed Falls evaluation completed Falls evaluation completed     Data saved with a previous flowsheet row definition   Functional Status Survey:    Vitals:   06/19/24 1253  BP: (!) 181/98  Pulse: 65  Resp: 17  Temp: 97.9 F (36.6 C)  SpO2: 91%  Weight: 193 lb 9.6 oz (87.8 kg)  Height: 5' 11 (1.803 m)   Body mass index is 27 kg/m. Physical Exam Constitutional:      General: He is not in acute distress.    Appearance: He is well-developed. He is not diaphoretic.  HENT:     Head: Normocephalic and atraumatic.     Right Ear: External ear normal.     Left Ear: External ear normal.     Mouth/Throat:     Pharynx: No oropharyngeal exudate.  Eyes:     Conjunctiva/sclera: Conjunctivae normal.     Pupils: Pupils are equal, round, and reactive to light.  Cardiovascular:     Rate and Rhythm: Normal rate and regular rhythm.     Heart sounds: Normal heart sounds.   Pulmonary:     Effort: Pulmonary effort is normal.     Breath sounds: Normal breath sounds.  Abdominal:     General: Bowel sounds are normal.     Palpations: Abdomen is soft.  Musculoskeletal:        General: No tenderness.     Cervical back: Normal range of motion and neck supple.     Right lower leg: No edema.     Left lower leg: No edema.  Skin:    General: Skin is warm and dry.  Neurological:     Mental Status: He is alert and oriented to person, place, and time.     Labs reviewed: Recent Labs    11/10/23 0952 02/20/24 0000 04/19/24 0000  NA 138 139 139  K 4.6 4.5 4.6  CL 100 103 104  CO2 23 27* 24*  GLUCOSE 160*  --   --   BUN 37* 32* 27*  CREATININE 1.52* 1.4* 1.3  CALCIUM  9.6 9.9 9.4   Recent Labs    04/19/24 0000  AST 21  ALT 27  ALKPHOS 82  ALBUMIN  4.2   Recent Labs    11/10/23 0952 02/20/24 0000 04/19/24 0000  WBC 8.0 9.4 6.9  NEUTROABS  --  6,853.00 4,644.00  HGB 15.1 15.0 14.9  HCT 45.9 46 46  MCV 96  --   --   PLT 249 285 283   Lab Results  Component Value Date   TSH 2.32 04/19/2024   Lab Results  Component Value Date   HGBA1C 7.6 04/19/2024   Lab Results  Component Value Date   CHOL 176 11/10/2023   HDL 97 11/10/2023   LDLCALC 67 11/10/2023   LDLDIRECT 126.2 04/05/2013   TRIG 59 11/10/2023   CHOLHDL 1.8 11/10/2023    Significant Diagnostic Results in last 30 days:  No results found.  Assessment/Plan Pneumonia Chronic cough worsened, chest X-ray confirmed pneumonia. - Prescribed Duricef 500 mg twice  daily for 7 days. - Prescribed Zithromax  500 mg daily for 5 days. - Administer DuoNeb nebulizer treatment twice daily. - Use incentive spirometer before meals and before bed. - Monitor for fever, chills, shortness of breath, or increased cough.  Hypertension Consistently high blood pressure readings. Currently taking norvasc  5 mg BID, will change to daily at this time -continues on losartan  and metoprolol  as prescribed.   -continue to follow up with cardiology due to CHF and HTN.   Othal Kubitz K. Caro BODILY [facility] [phone]       [1] No Known Allergies  "
